# Patient Record
Sex: Male | Born: 1965 | Race: Black or African American | Hispanic: No | Marital: Single | Smoking: Current every day smoker
Health system: Southern US, Community
[De-identification: ages and names within clinical notes are randomized; demographics above are authoritative.]

## PROBLEM LIST (undated history)

## (undated) DIAGNOSIS — Z87442 Personal history of urinary calculi: Secondary | ICD-10-CM

## (undated) DIAGNOSIS — I10 Essential (primary) hypertension: Secondary | ICD-10-CM

## (undated) DIAGNOSIS — N200 Calculus of kidney: Secondary | ICD-10-CM

## (undated) DIAGNOSIS — R0600 Dyspnea, unspecified: Secondary | ICD-10-CM

## (undated) DIAGNOSIS — K7460 Unspecified cirrhosis of liver: Secondary | ICD-10-CM

## (undated) DIAGNOSIS — E7800 Pure hypercholesterolemia, unspecified: Secondary | ICD-10-CM

## (undated) DIAGNOSIS — J45909 Unspecified asthma, uncomplicated: Secondary | ICD-10-CM

## (undated) HISTORY — PX: APPENDECTOMY: SHX54

## (undated) HISTORY — PX: FRACTURE SURGERY: SHX138

---

## 2007-12-22 ENCOUNTER — Emergency Department (HOSPITAL_COMMUNITY): Admission: EM | Admit: 2007-12-22 | Discharge: 2007-12-22 | Payer: Self-pay | Admitting: Emergency Medicine

## 2008-01-14 ENCOUNTER — Emergency Department (HOSPITAL_COMMUNITY): Admission: EM | Admit: 2008-01-14 | Discharge: 2008-01-14 | Payer: Self-pay | Admitting: Emergency Medicine

## 2008-03-23 ENCOUNTER — Emergency Department (HOSPITAL_COMMUNITY): Admission: EM | Admit: 2008-03-23 | Discharge: 2008-03-23 | Payer: Self-pay | Admitting: Emergency Medicine

## 2010-03-29 ENCOUNTER — Emergency Department (HOSPITAL_COMMUNITY)
Admission: EM | Admit: 2010-03-29 | Discharge: 2010-03-29 | Payer: Self-pay | Source: Home / Self Care | Admitting: Emergency Medicine

## 2010-03-29 IMAGING — CR DG FOOT COMPLETE 3+V*R*
3 series · 3 of 3 positions shown · non-contrast
Comparison: None.

CLINICAL DATA: Left foot pain.  Jumped from ladder.

RIGHT FOOT COMPLETE - 3+ VIEW

[view not recorded (1 of 3)]
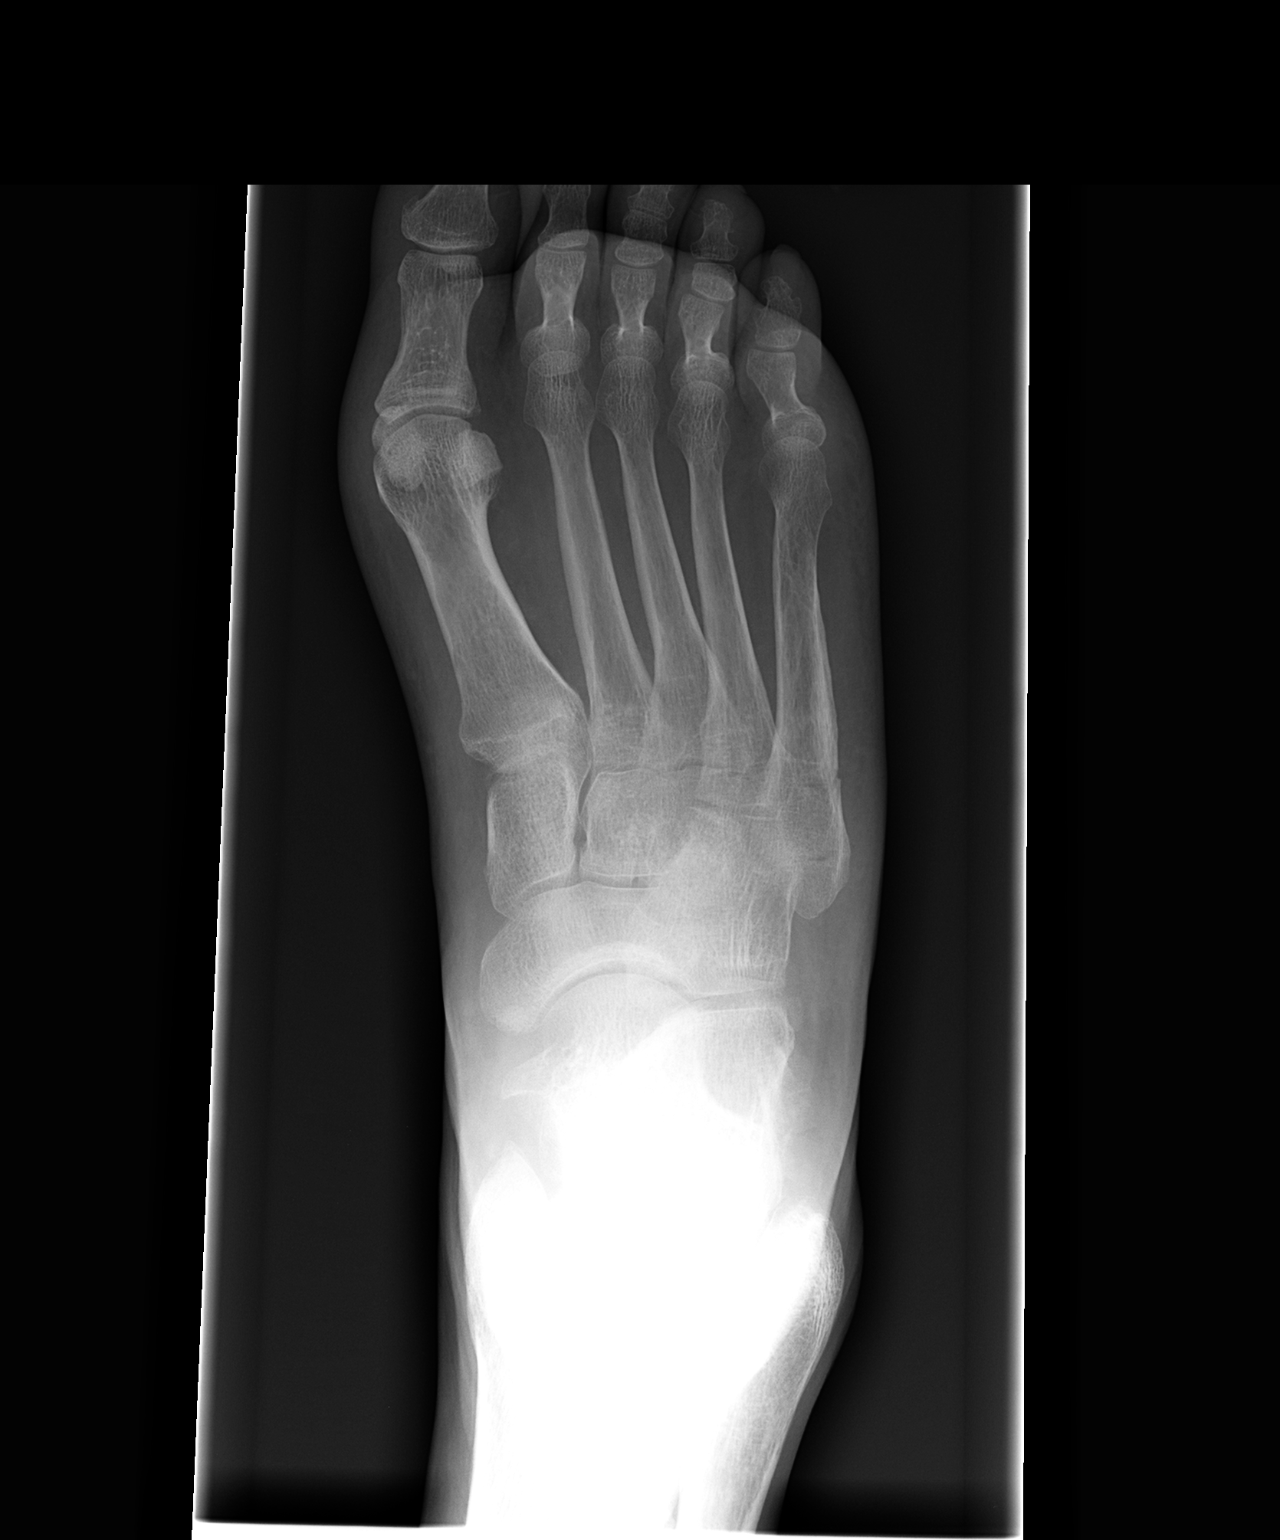

[view not recorded (2 of 3)]
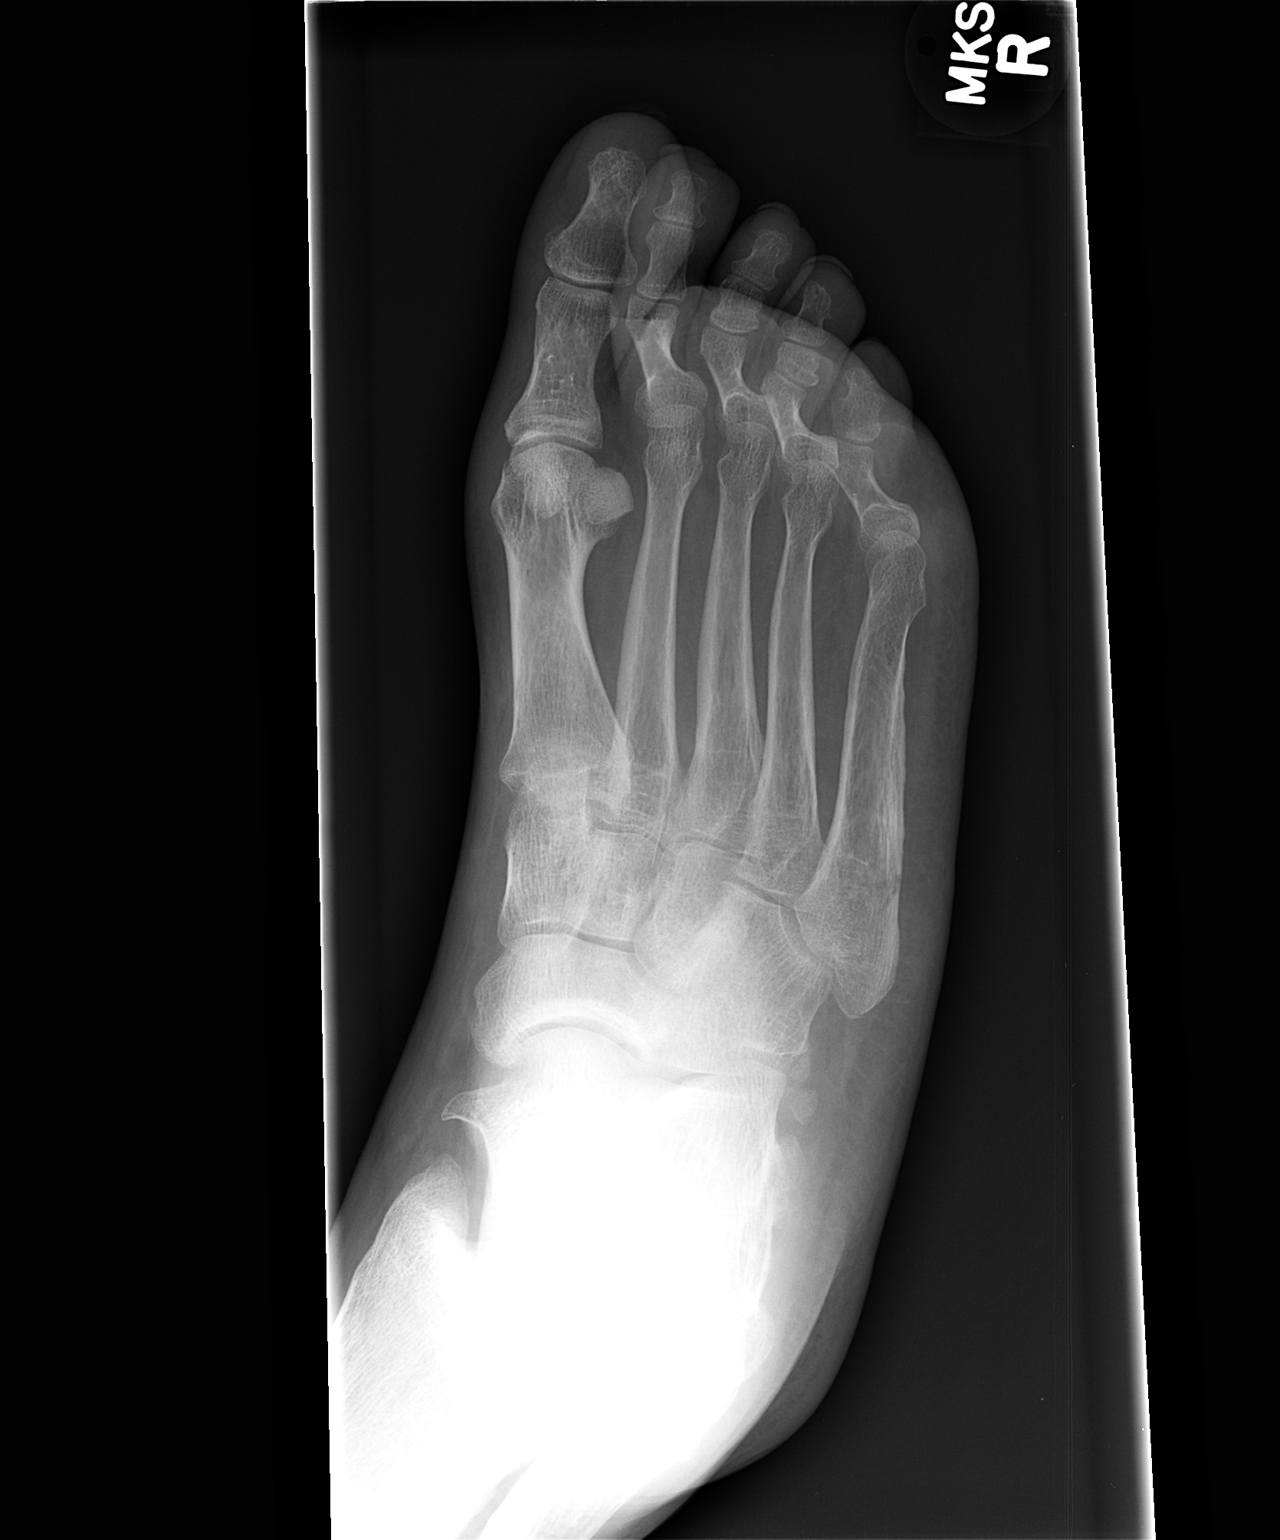

[view not recorded (3 of 3)]
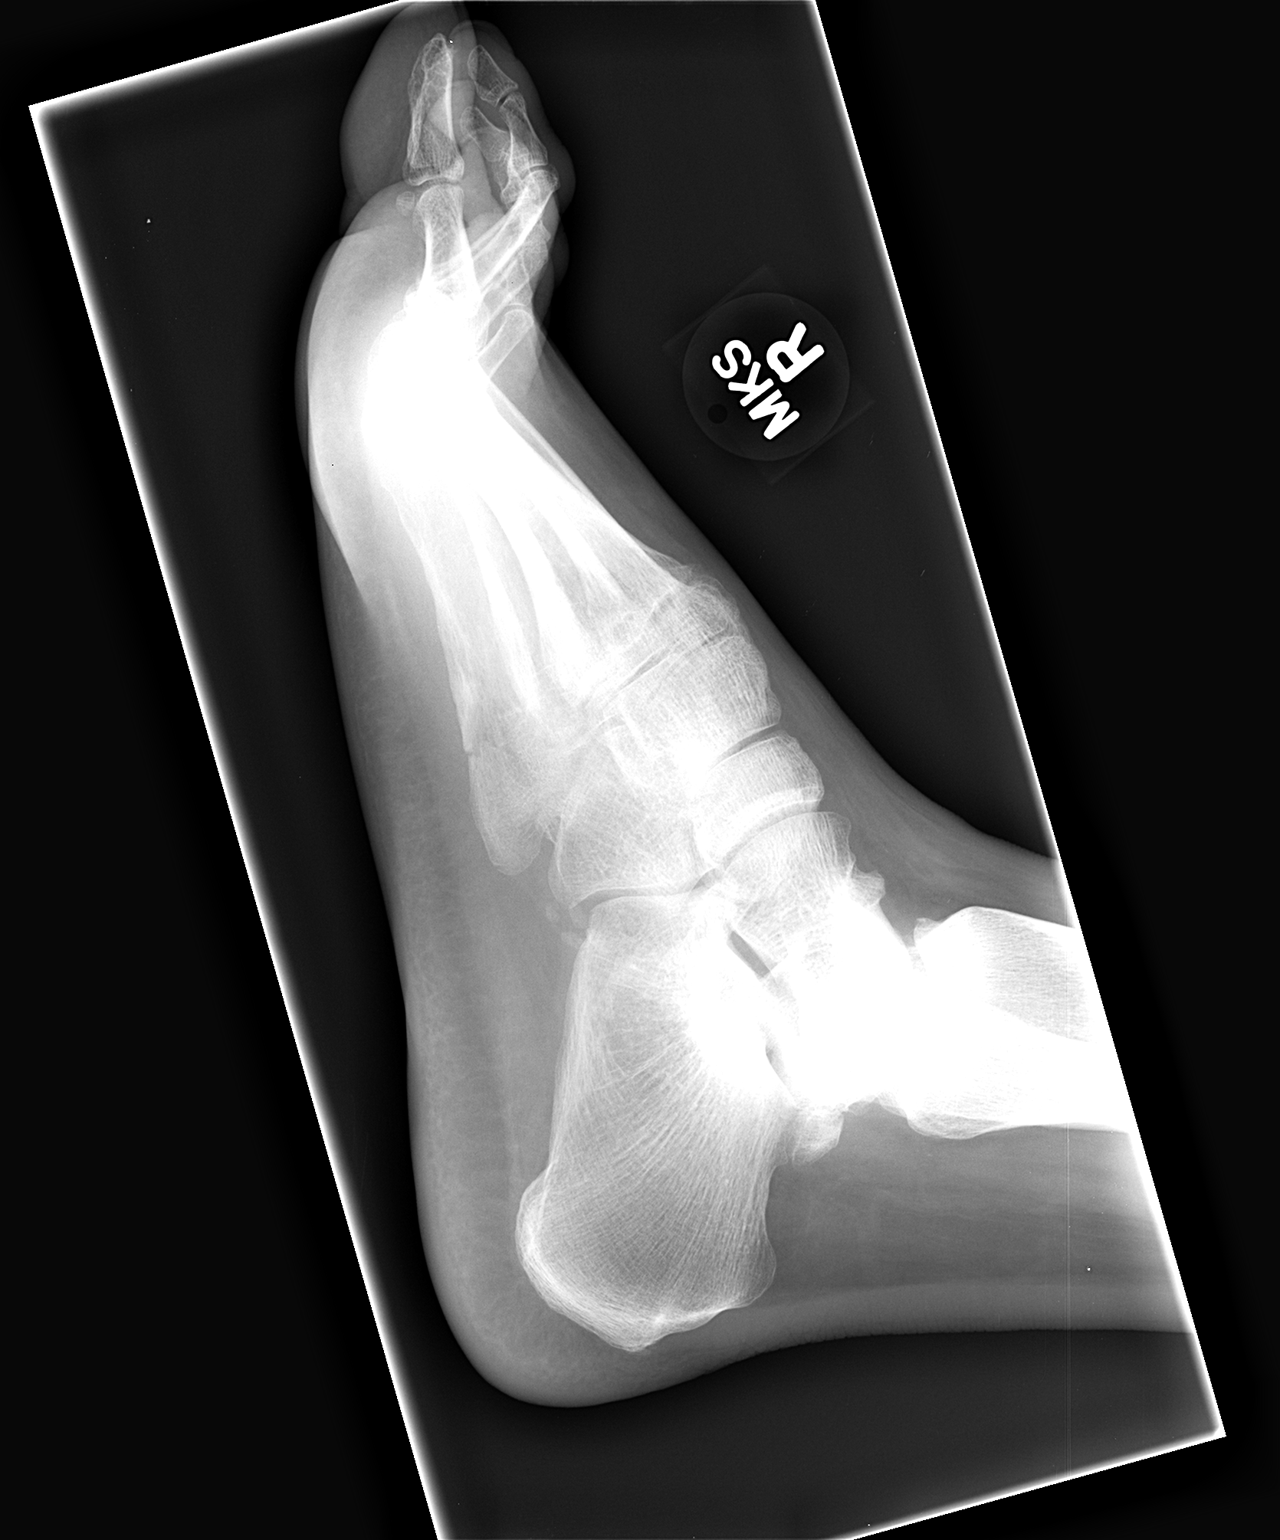

[3 of 3 positions shown; findings below may reference images not displayed]

FINDINGS: There is a comminuted, minimally displaced fracture at
the base of the fifth metatarsal with intra-articular extension
seen.  No other fractures are identified.  Mild midfoot
osteoarthritis is noted.
IMPRESSION: Fracture at the base of the fifth metatarsal.

## 2010-03-29 IMAGING — CT CT EXTREM LOW W/O CM*L*
3 of 4 series · 8 of 33 positions shown, 10 images · non-contrast
Comparison: [DATE] plain film exam.

CLINICAL DATA: Jumped from ladder.  Possible calcaneal and fifth
metatarsal fracture.

CT LEFT FOOT/ANKLE. WITHOUT CONTRAST
TECHNIQUE: Multidetector CT imaging of the left foot/ankle. was
performed according to the standard protocol without intravenous
contrast. Multiplanar CT image reconstructions were also generated.

[Series 3: axial bone pacs 2.0 · coronal · 0.26mm/px · 1 of 126 slices shown]
[im 63/126  bone]
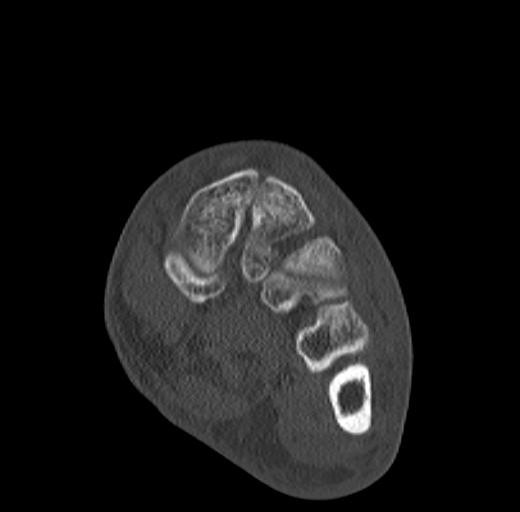

[Series 4: coro bone pacs 2.0 · axial · 0.22mm/px · z∈[-198,-140]mm · 2 of 64 slices shown, 3 images]
[im 20/64  soft-tissue]
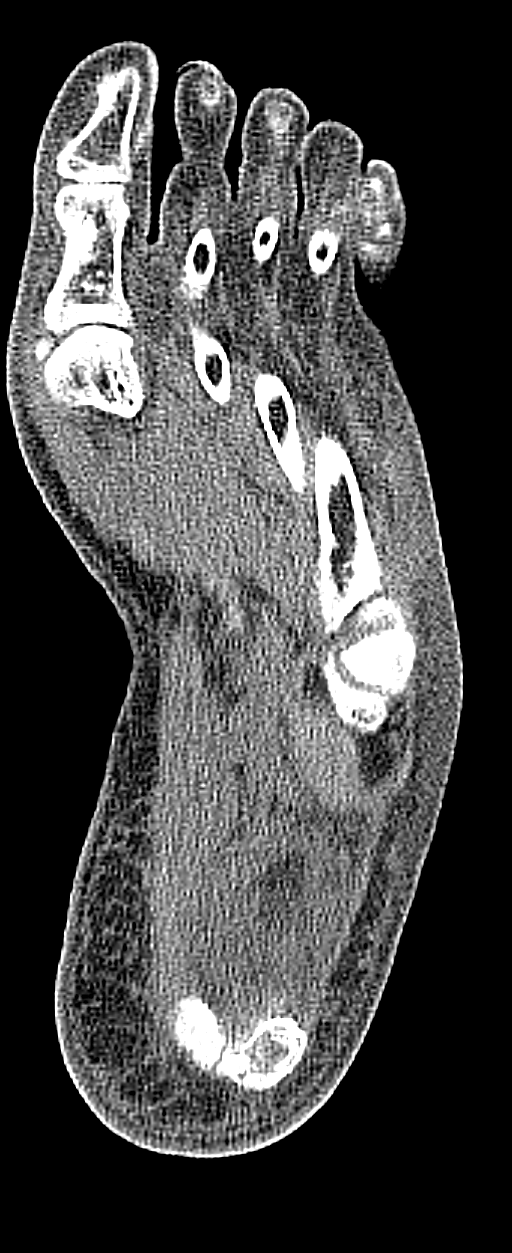
[im 20/64  bone]
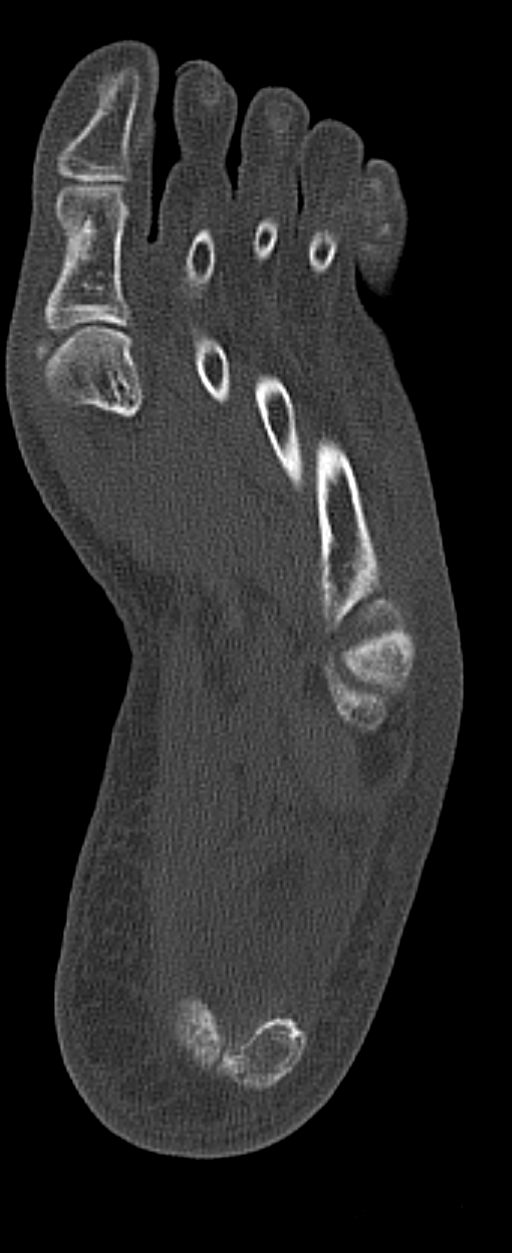
[im 49/64  bone]
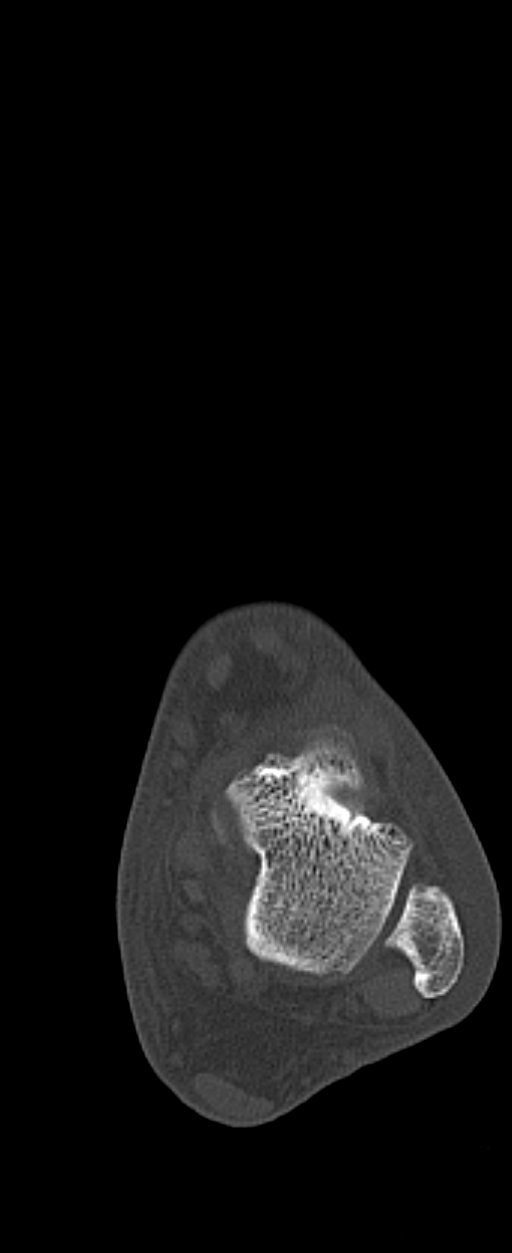

[Series 5: sagittal bone pacs 2.0 · sagittal · 0.27mm/px · 5 of 54 slices shown, 6 images]
[im 18/54  bone]
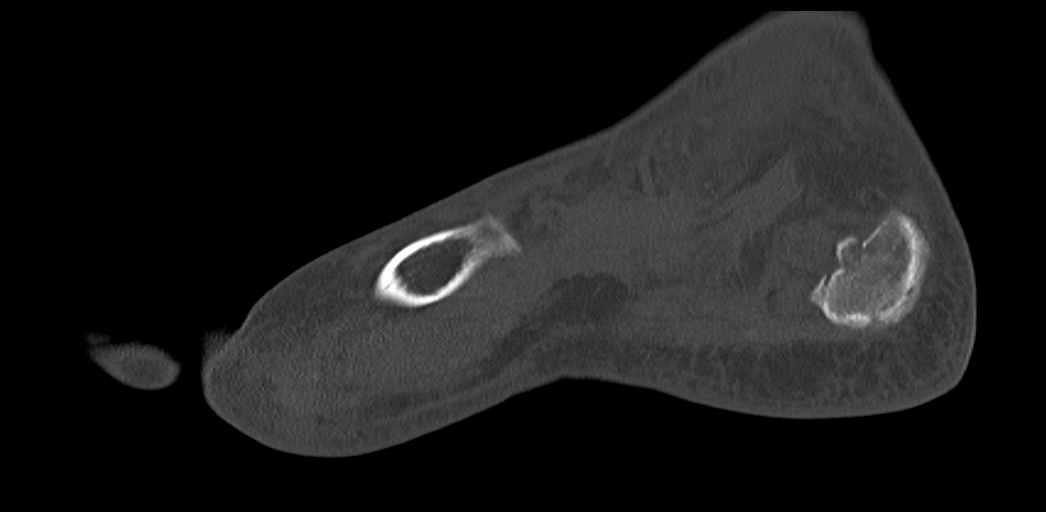
[im 23/54  bone]
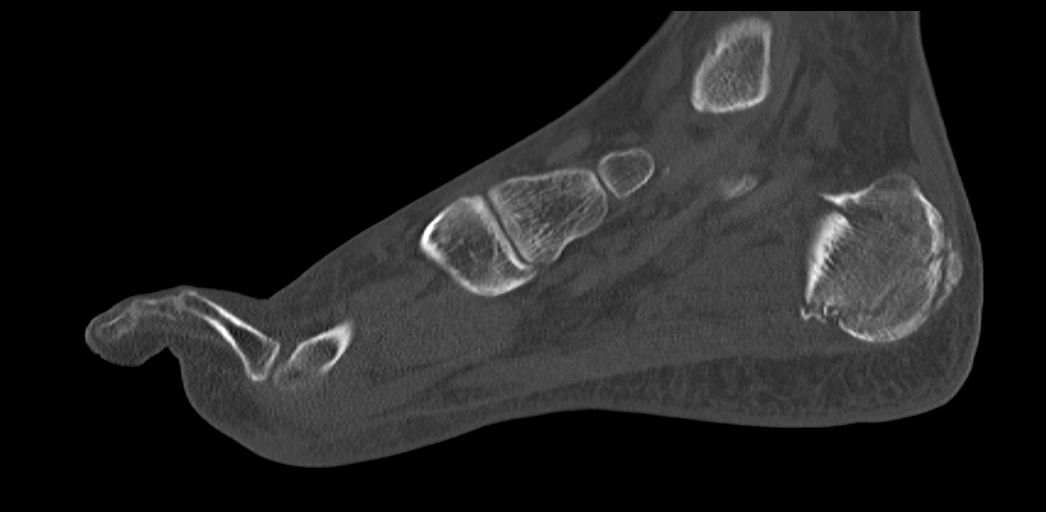
[im 27/54  soft-tissue]
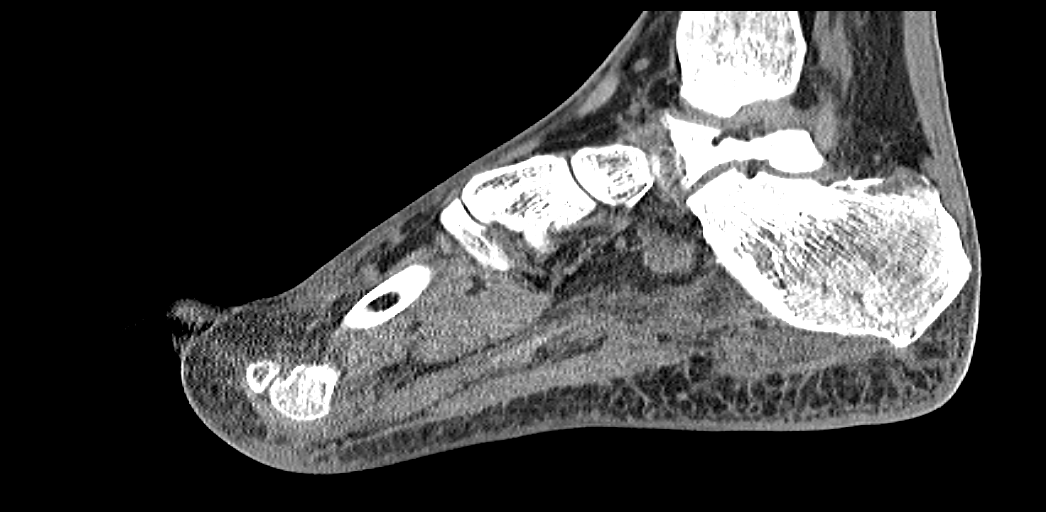
[im 27/54  bone]
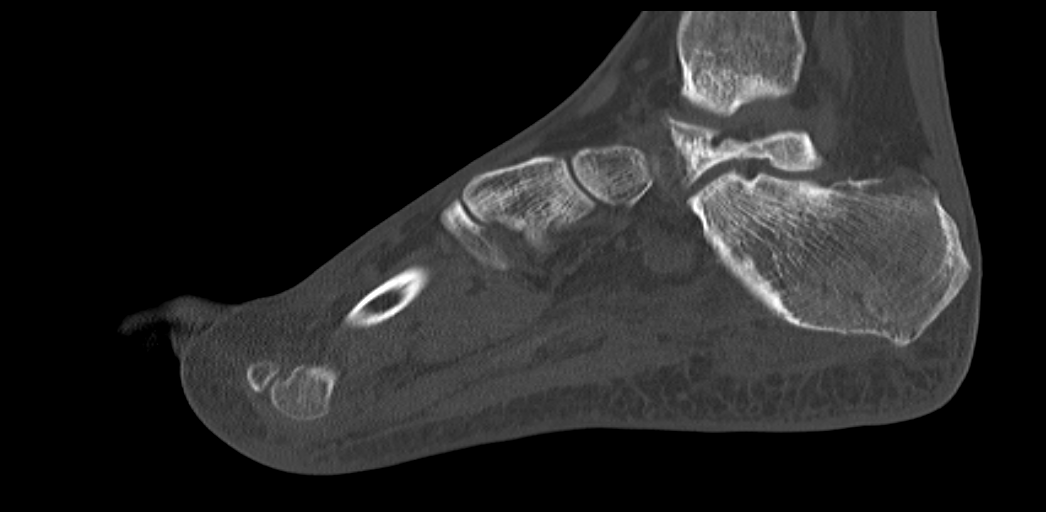
[im 31/54  bone]
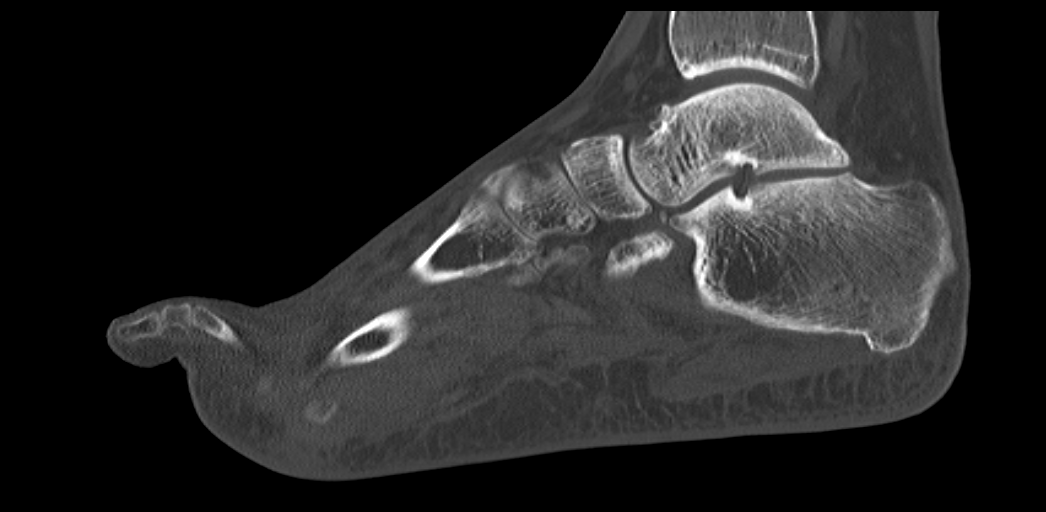
[im 36/54  bone]
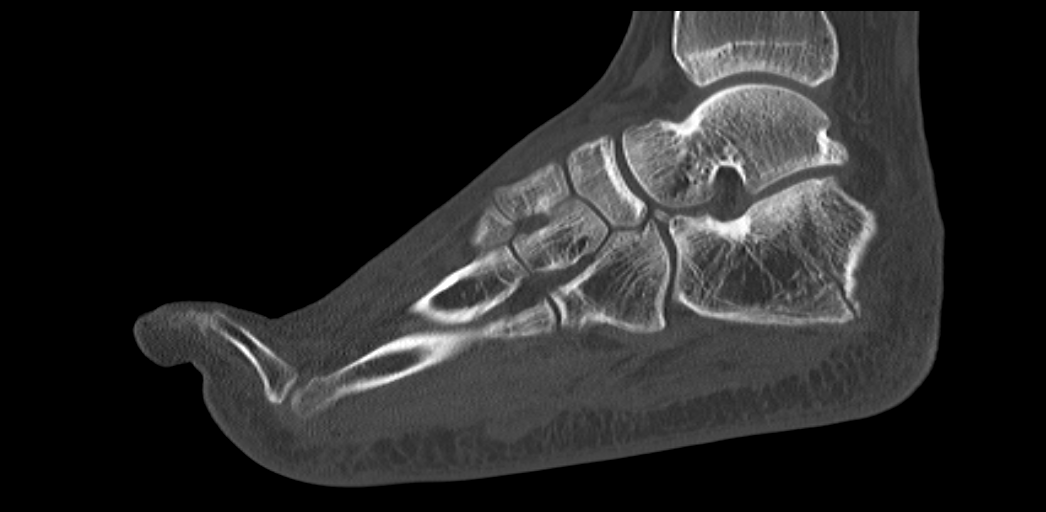

[8 of 33 positions shown; findings below may reference images not displayed]

FINDINGS: Comminuted fracture of the posterior and medial aspect
of the calcaneus extending towards the region of the posterior
articular surface (does not enter the joint space).  Fracture
extends into the base of the sustentaculum tali.  Fracture
fragments are separated by up to 4 mm.  No other fracture noted.
IMPRESSION: Comminuted fracture of the posterior and medial aspect of the
calcaneus extending towards the region of the posterior articular
surface (does not enter the joint space).  Fracture extends into
the base of the sustentaculum tali.  Fracture fragments are
separated by up to 4 mm.

No other fracture noted.

## 2010-03-29 IMAGING — CR DG FOOT COMPLETE 3+V*L*
3 series · 3 of 3 positions shown · non-contrast
Comparison: Today's films

CLINICAL DATA: Jumped off ladder 10 feet 4 days ago.  Pain in the
entire foot and ankle.

LEFT FOOT - COMPLETE 3+ VIEW

[view not recorded (1 of 3)]
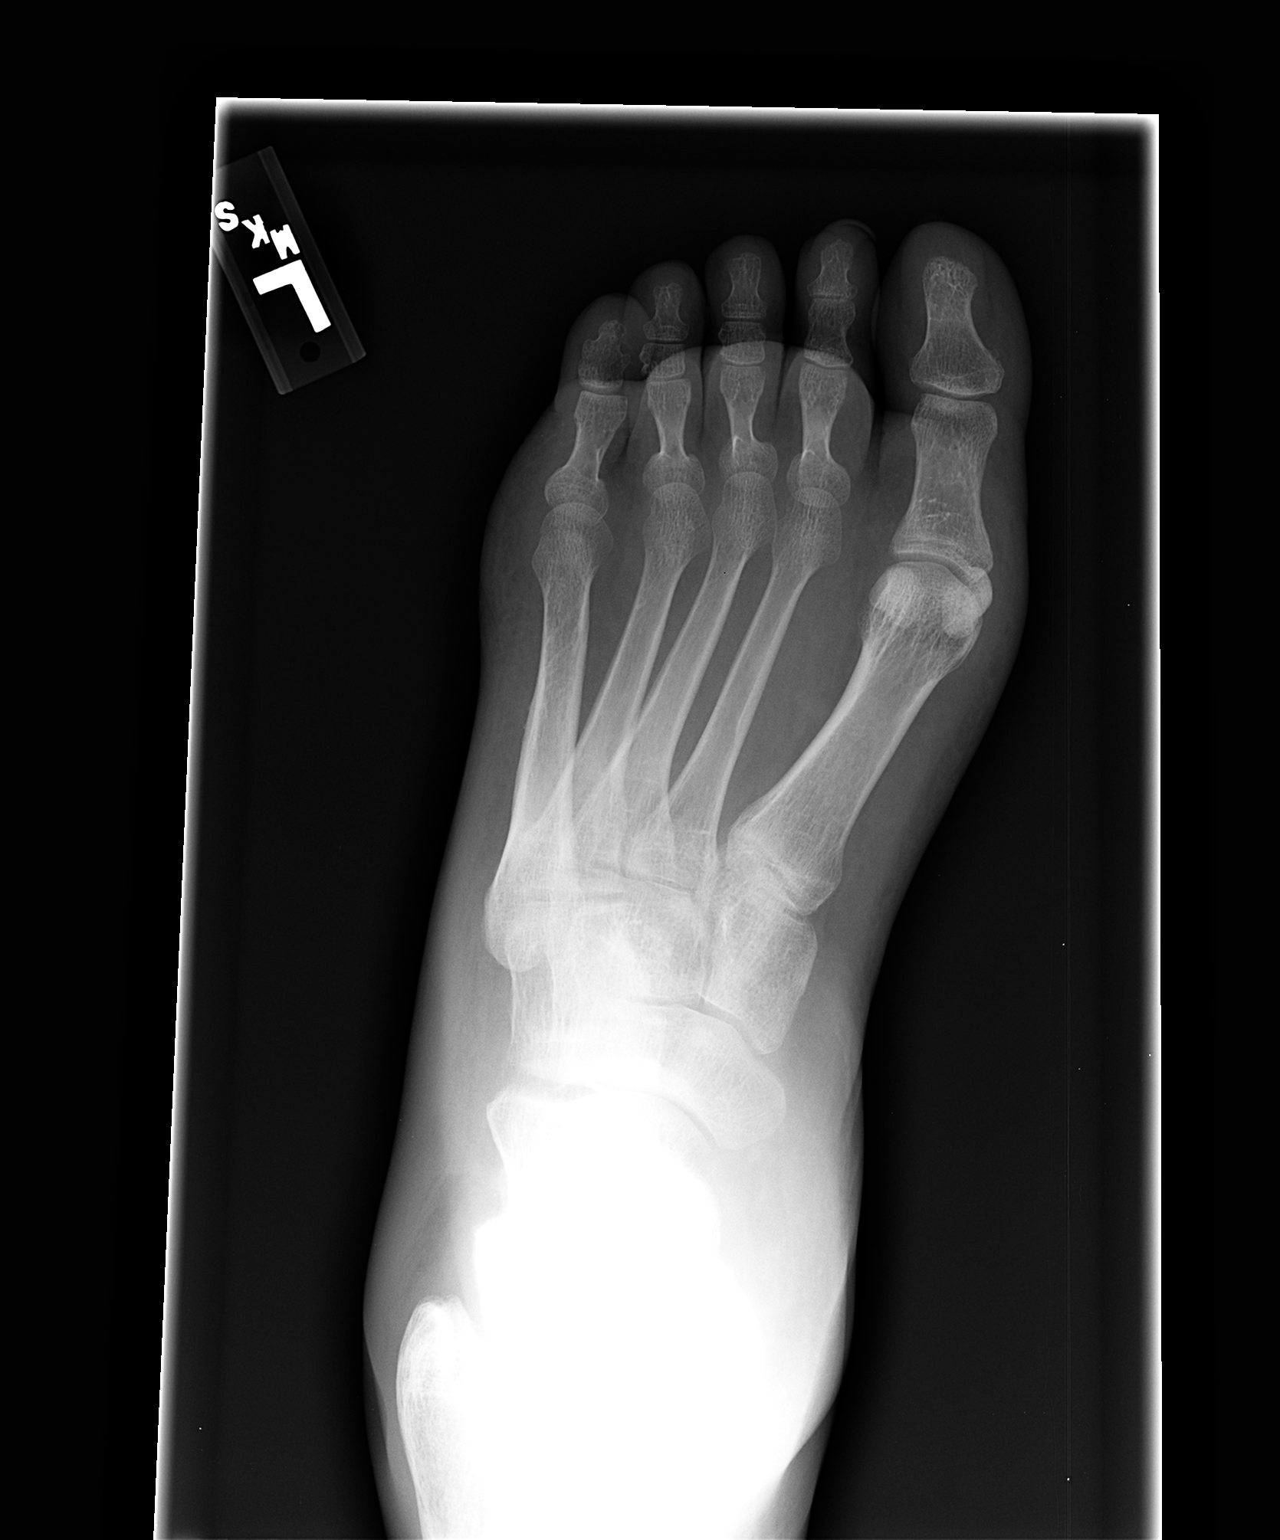

[view not recorded (2 of 3)]
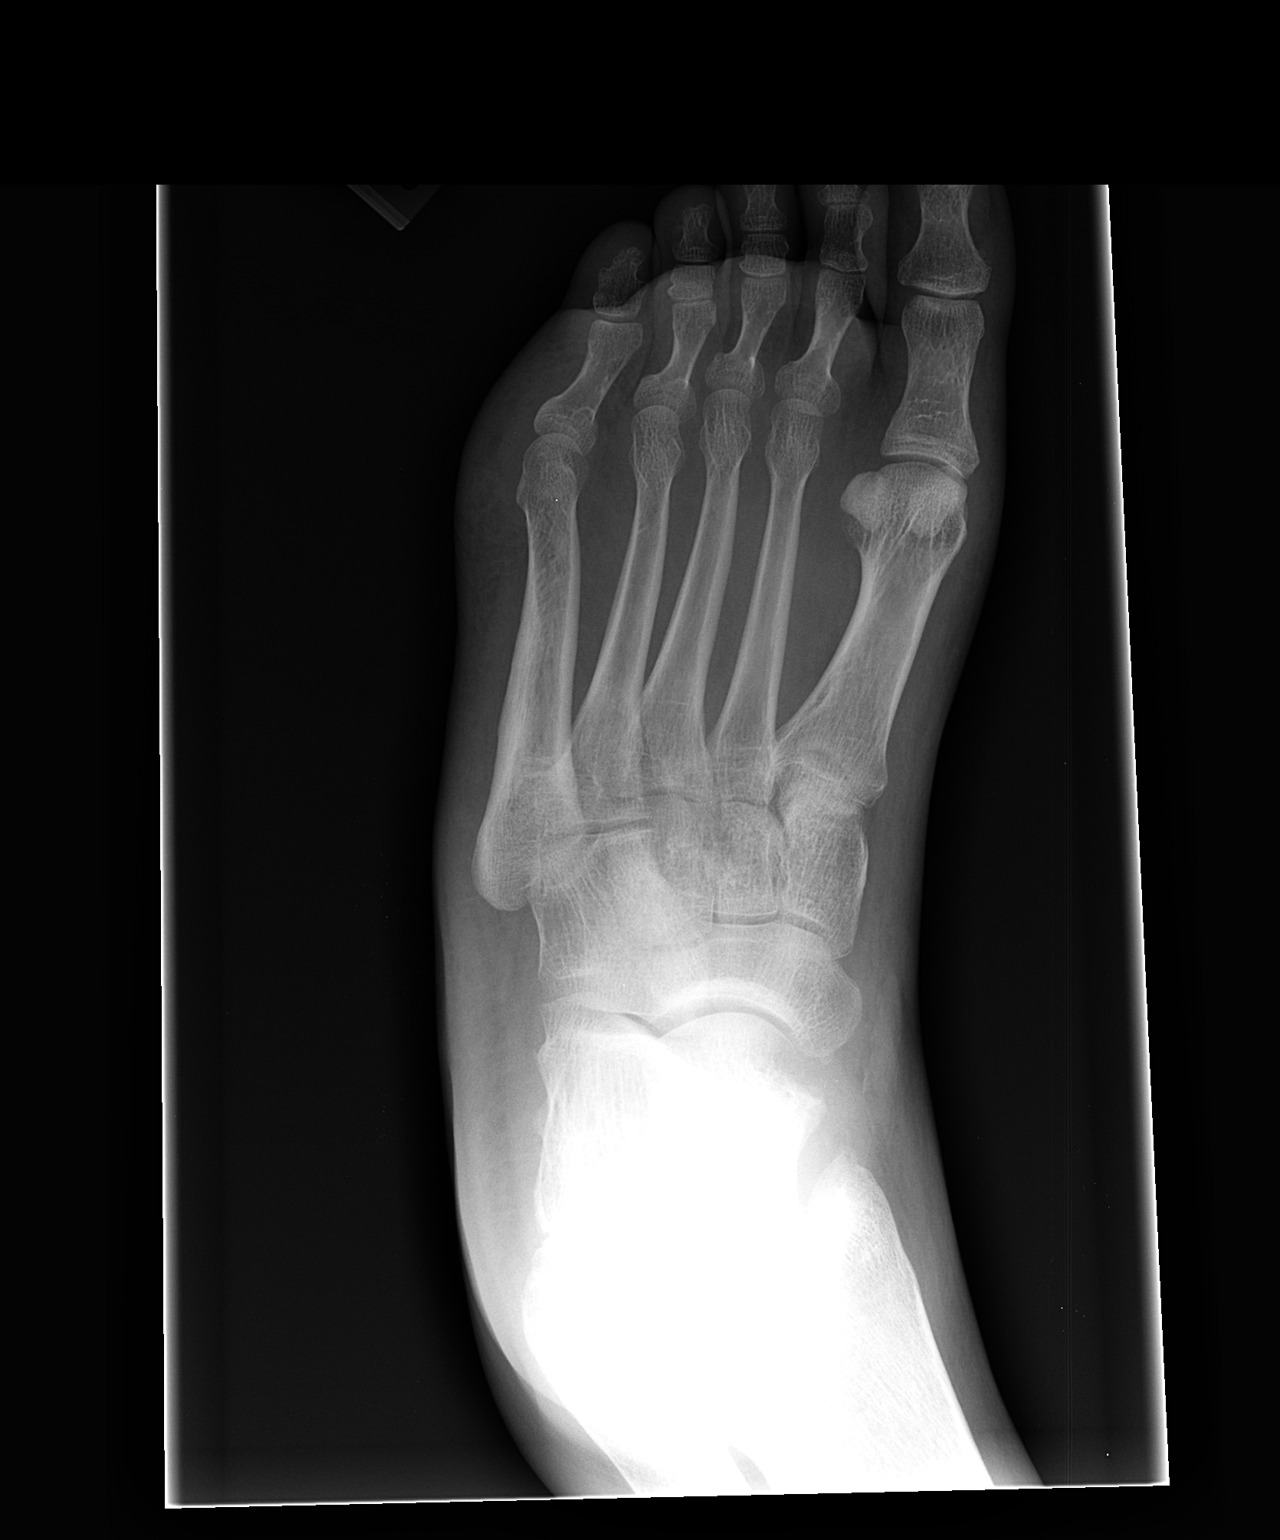

[view not recorded (3 of 3)]
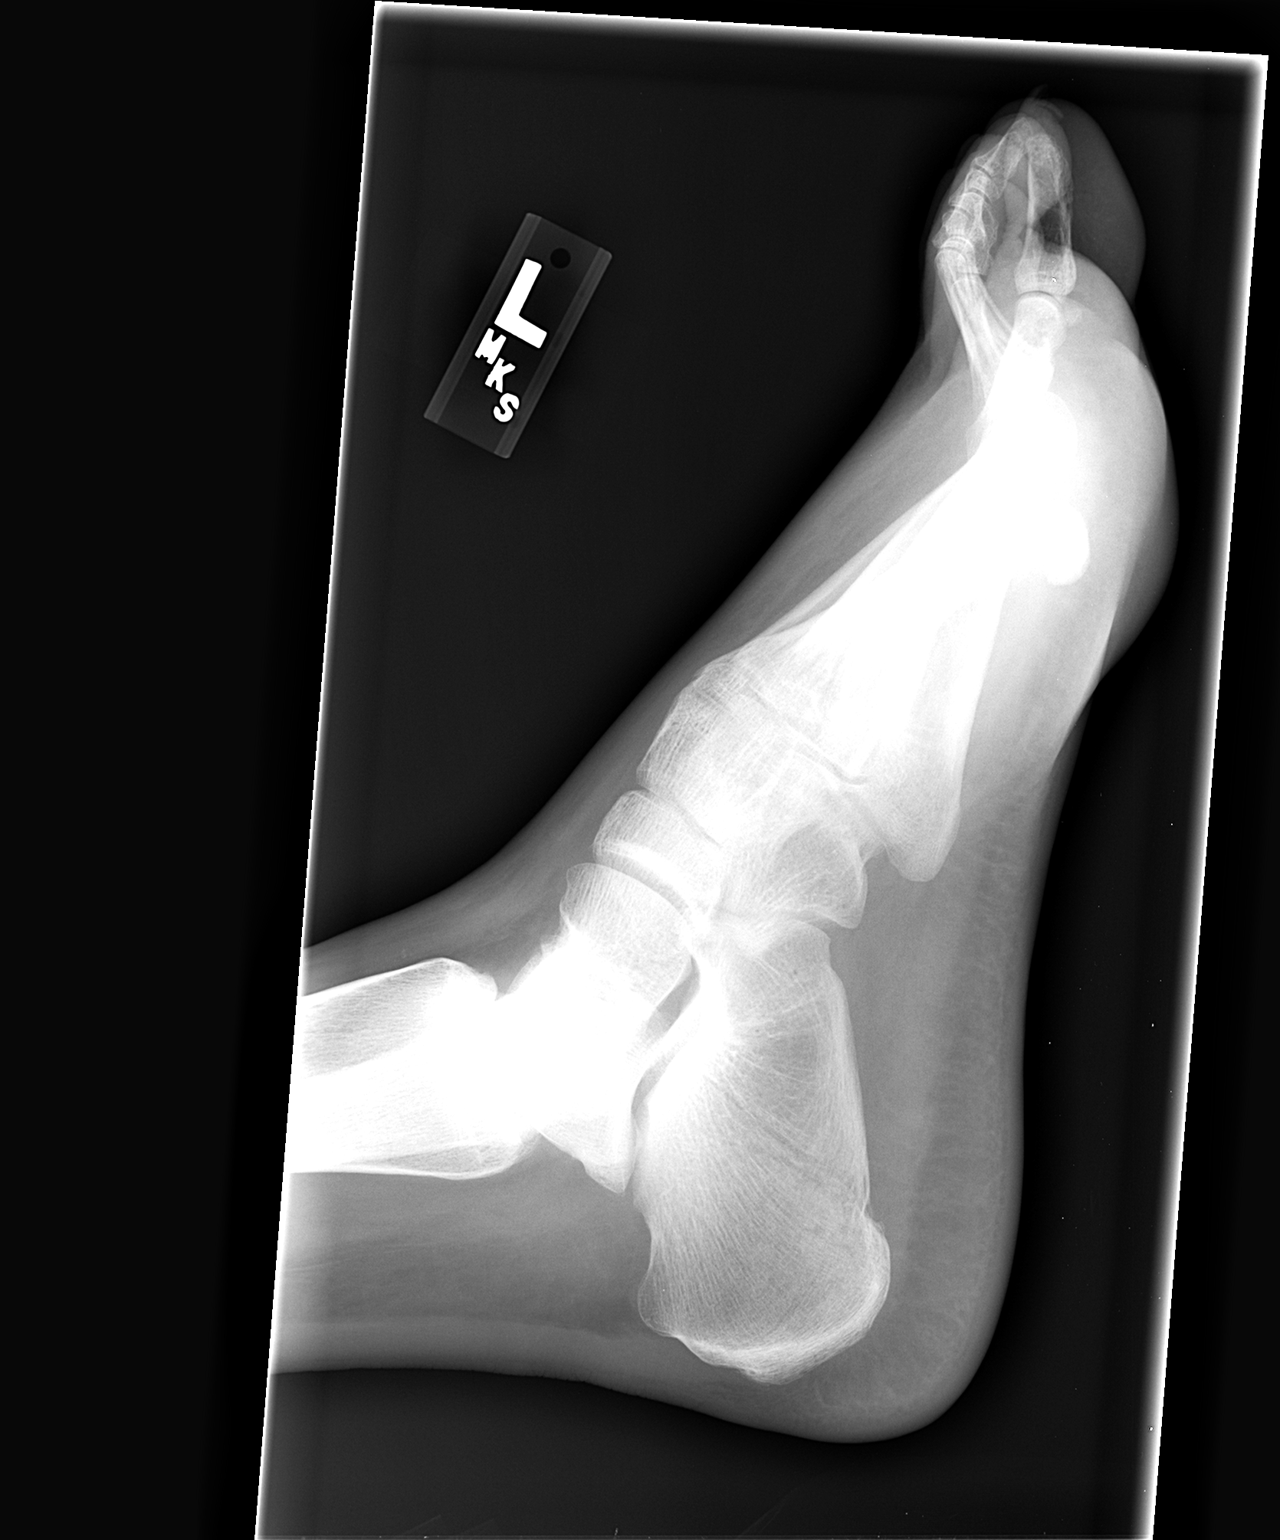

[3 of 3 positions shown; findings below may reference images not displayed]

FINDINGS: There is diffuse soft tissue swelling of the foot.  The
lateral view raises the question of calcaneus fracture.  Further
evaluation with CT may be helpful.  No radiopaque foreign body or
soft tissue gas identified.
IMPRESSION: Question of calcaneus fracture.  Diffuse soft tissue swelling.

## 2010-03-29 IMAGING — CR DG ANKLE COMPLETE 3+V*L*
3 series · 3 of 3 positions shown · non-contrast
Comparison: None

CLINICAL DATA: Jumped off ladder, 10 feet.  Landed on both feet.
Medial left ankle pain.  Lateral right ankle pain.  Entire foot and
ankle swelling.  Injury 4 days ago.

LEFT ANKLE COMPLETE - 3+ VIEW

[view not recorded (1 of 3)]
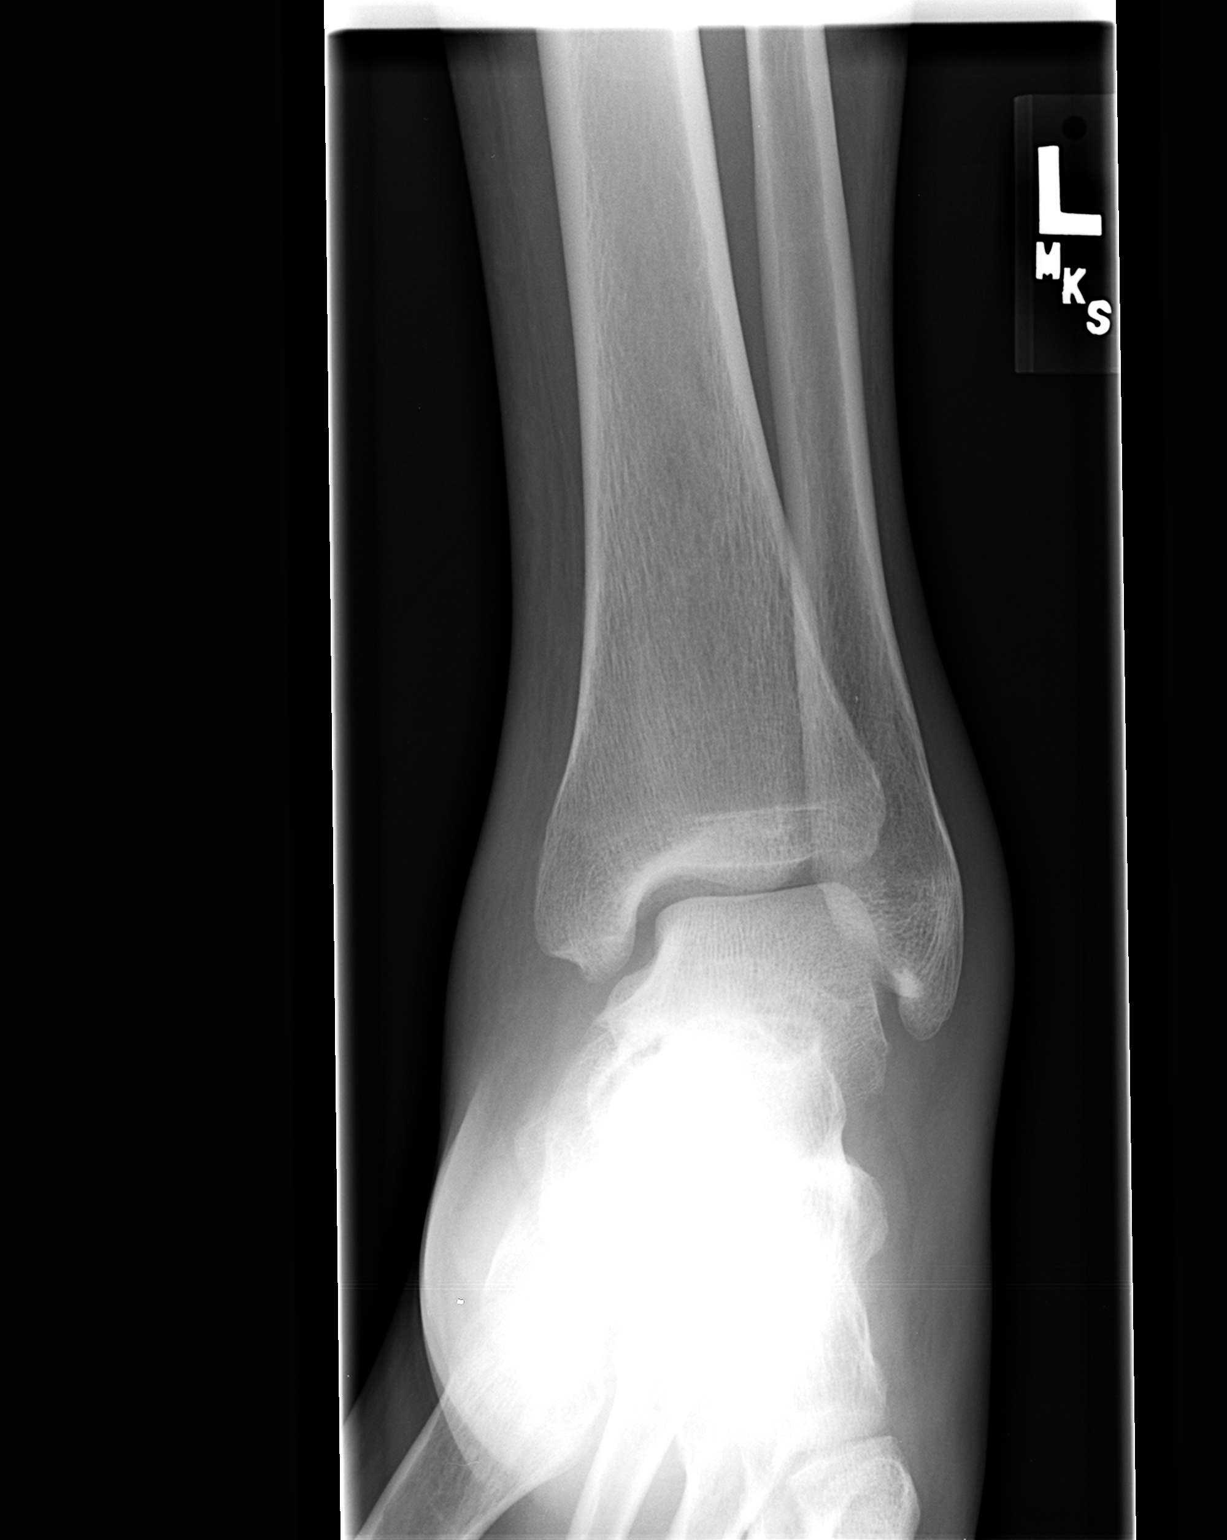

[view not recorded (2 of 3)]
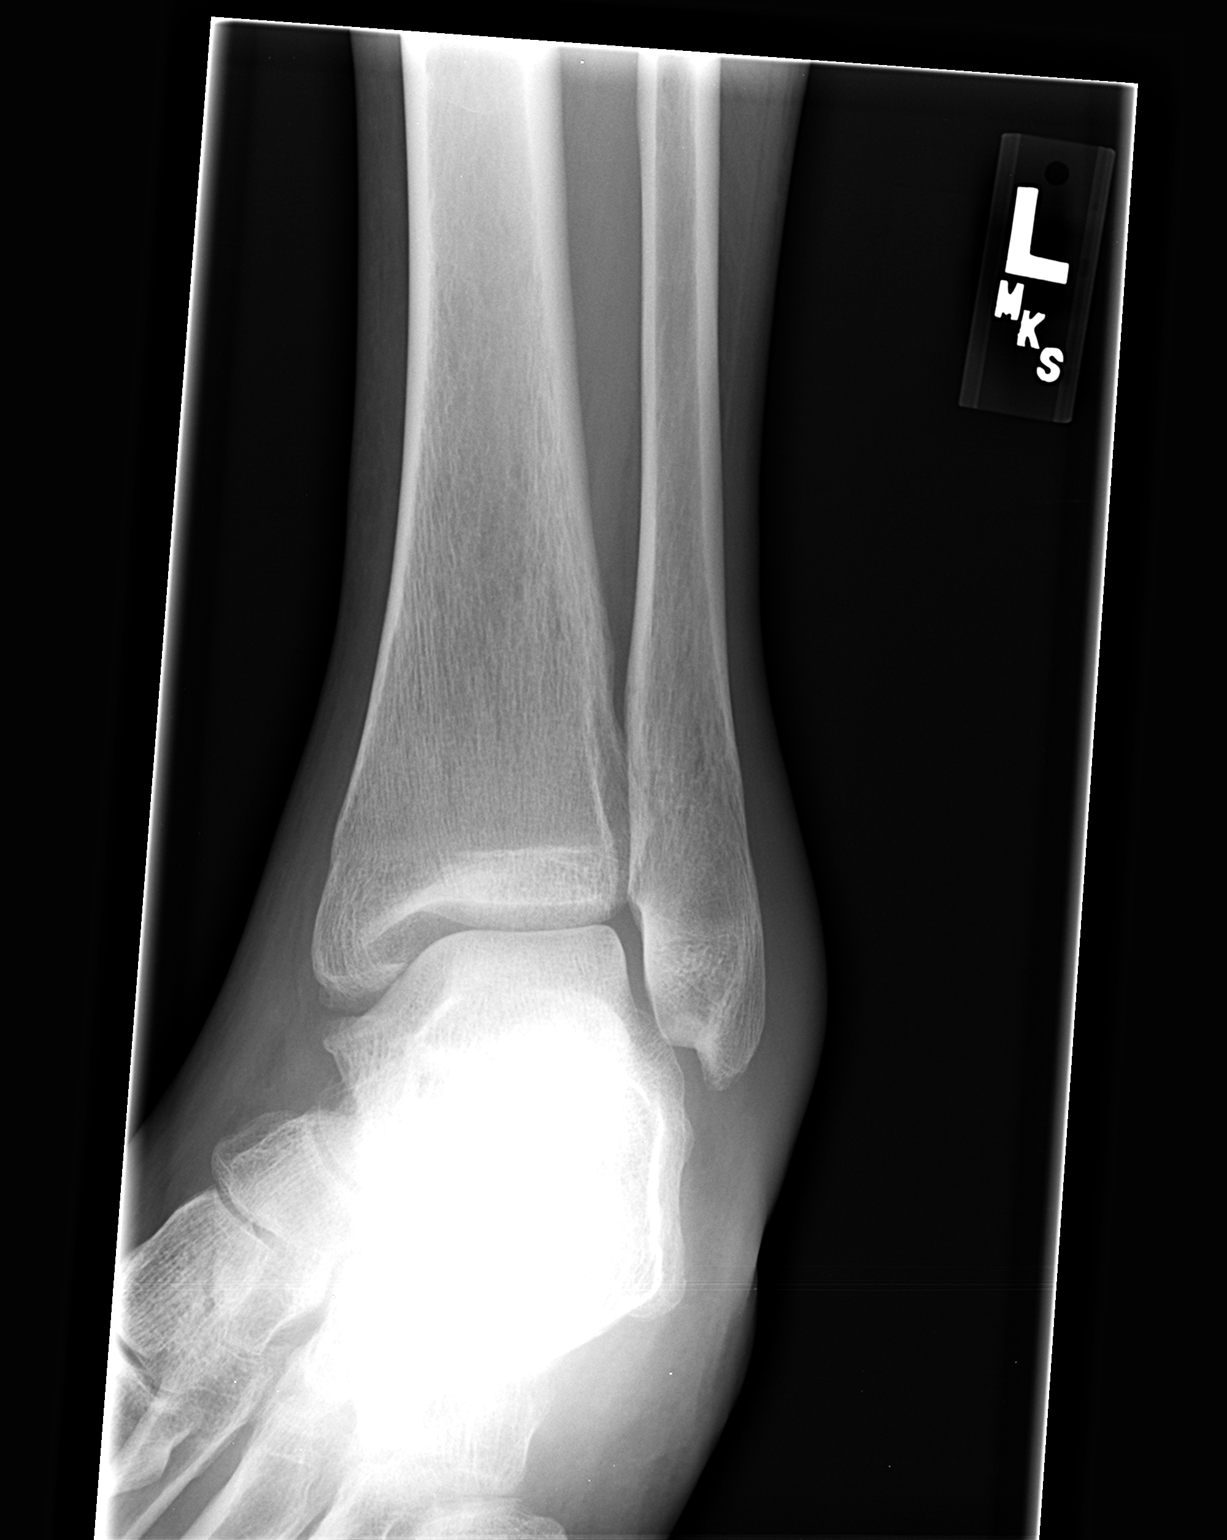

[view not recorded (3 of 3)]
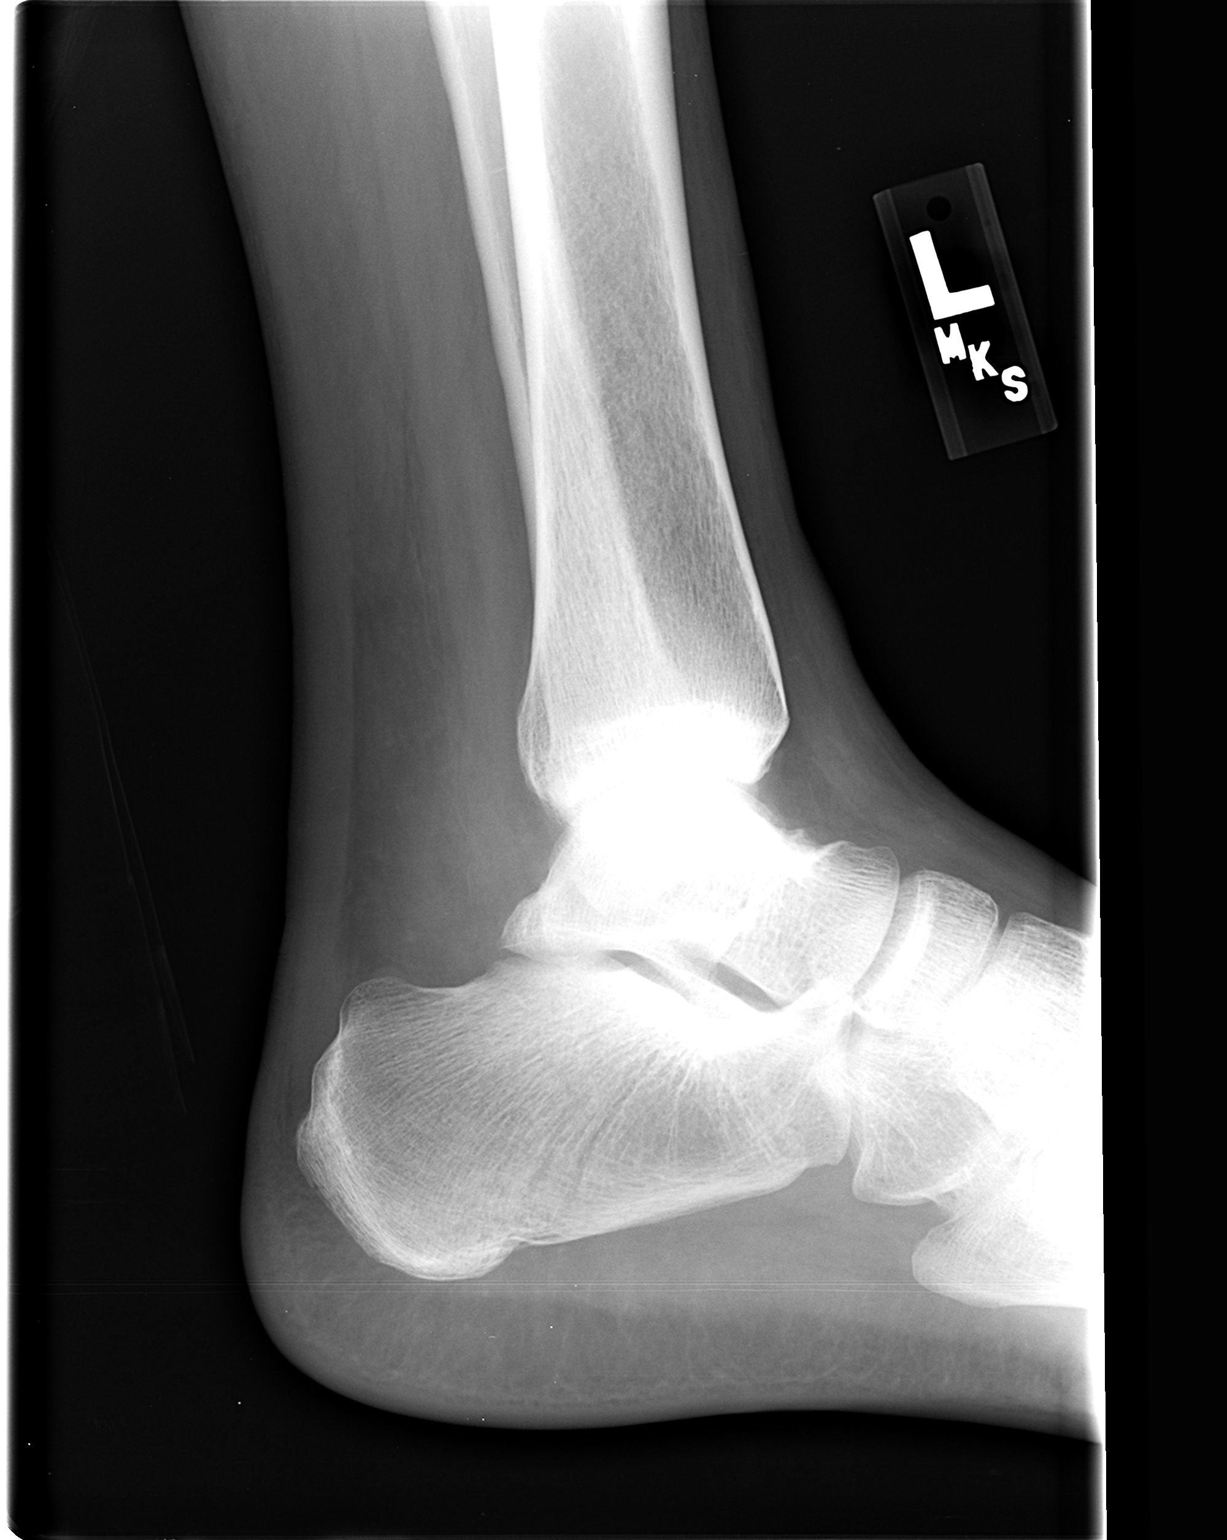

[3 of 3 positions shown; findings below may reference images not displayed]

FINDINGS: There is diffuse soft tissue swelling.    Linear lucency
traverses the calcaneus, raising the question of calcaneal
fracture.  The distal tibia and fibula appear intact.  The talus
appears intact.
IMPRESSION: 1.  Question of calcaneus fracture.  Consider CT as indicated.
2.  Diffuse soft tissue swelling.

## 2010-03-29 IMAGING — CR DG ANKLE COMPLETE 3+V*R*
3 series · 3 of 3 positions shown · non-contrast
Comparison: Other views today

CLINICAL DATA: Jumped off ladder 10 feet 4 days ago.  Lateral right
ankle pain.

RIGHT ANKLE - COMPLETE 3+ VIEW

[view not recorded (1 of 3)]
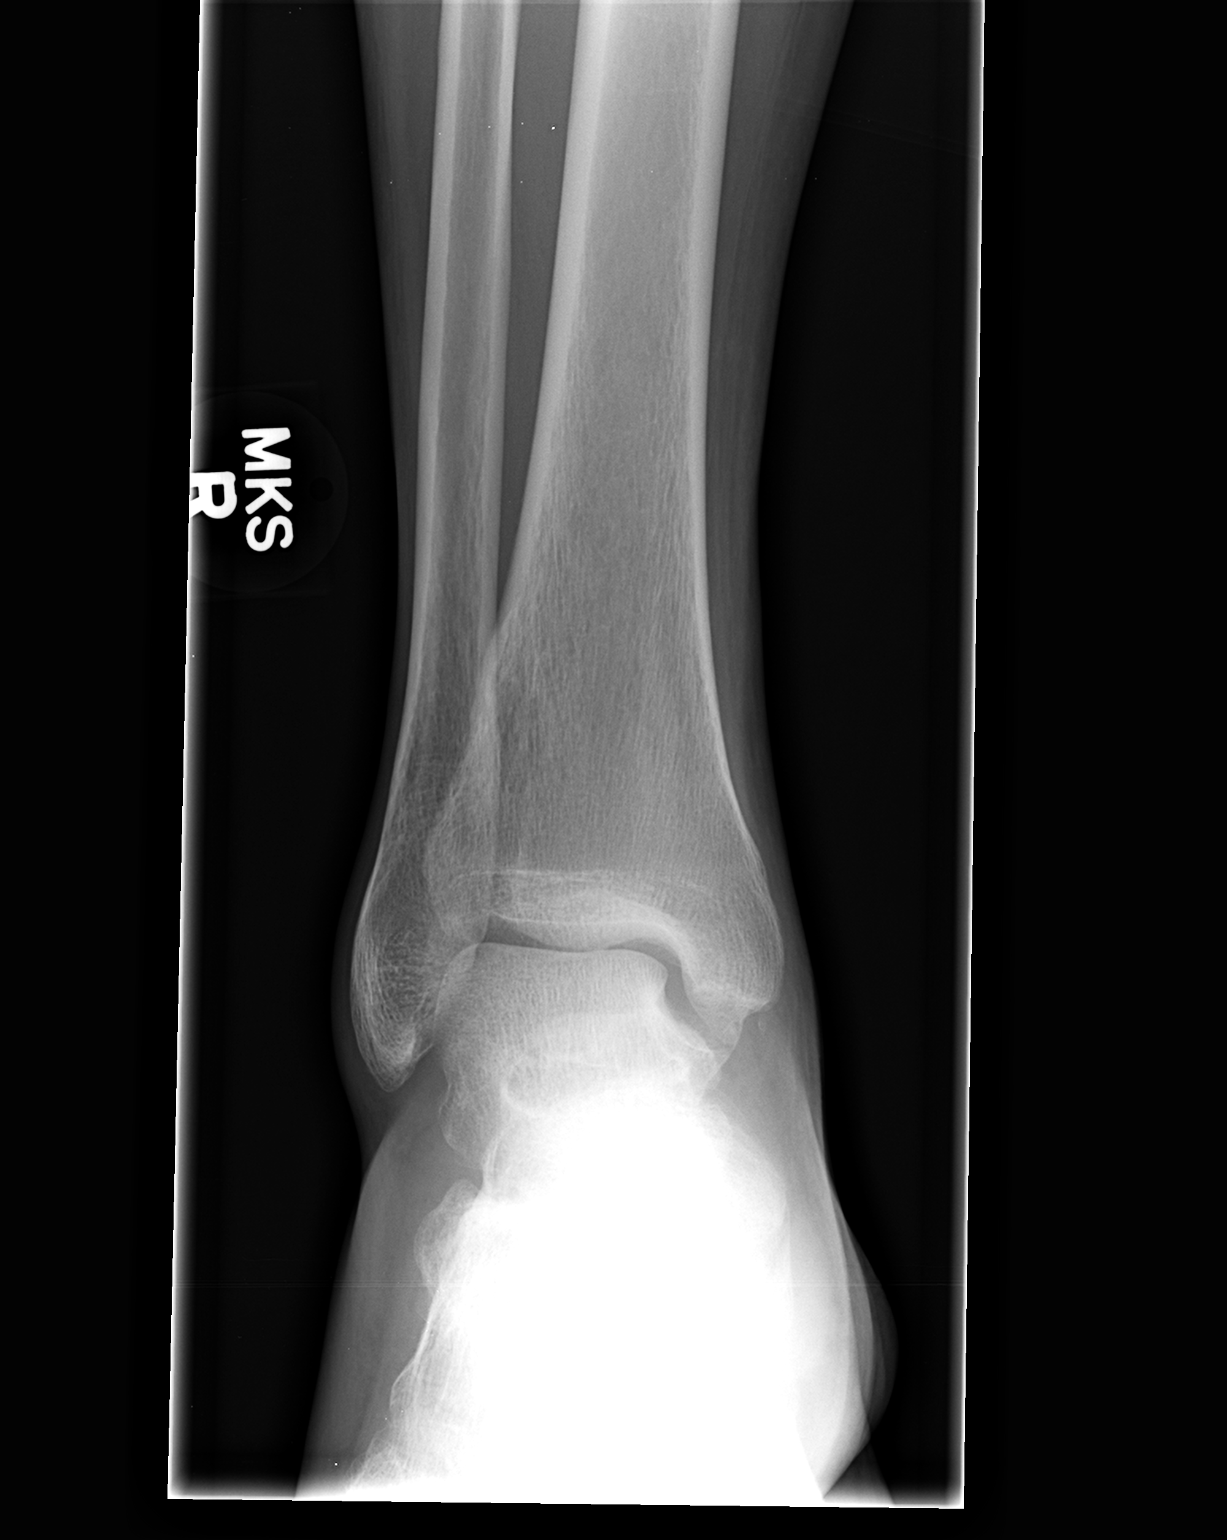

[view not recorded (2 of 3)]
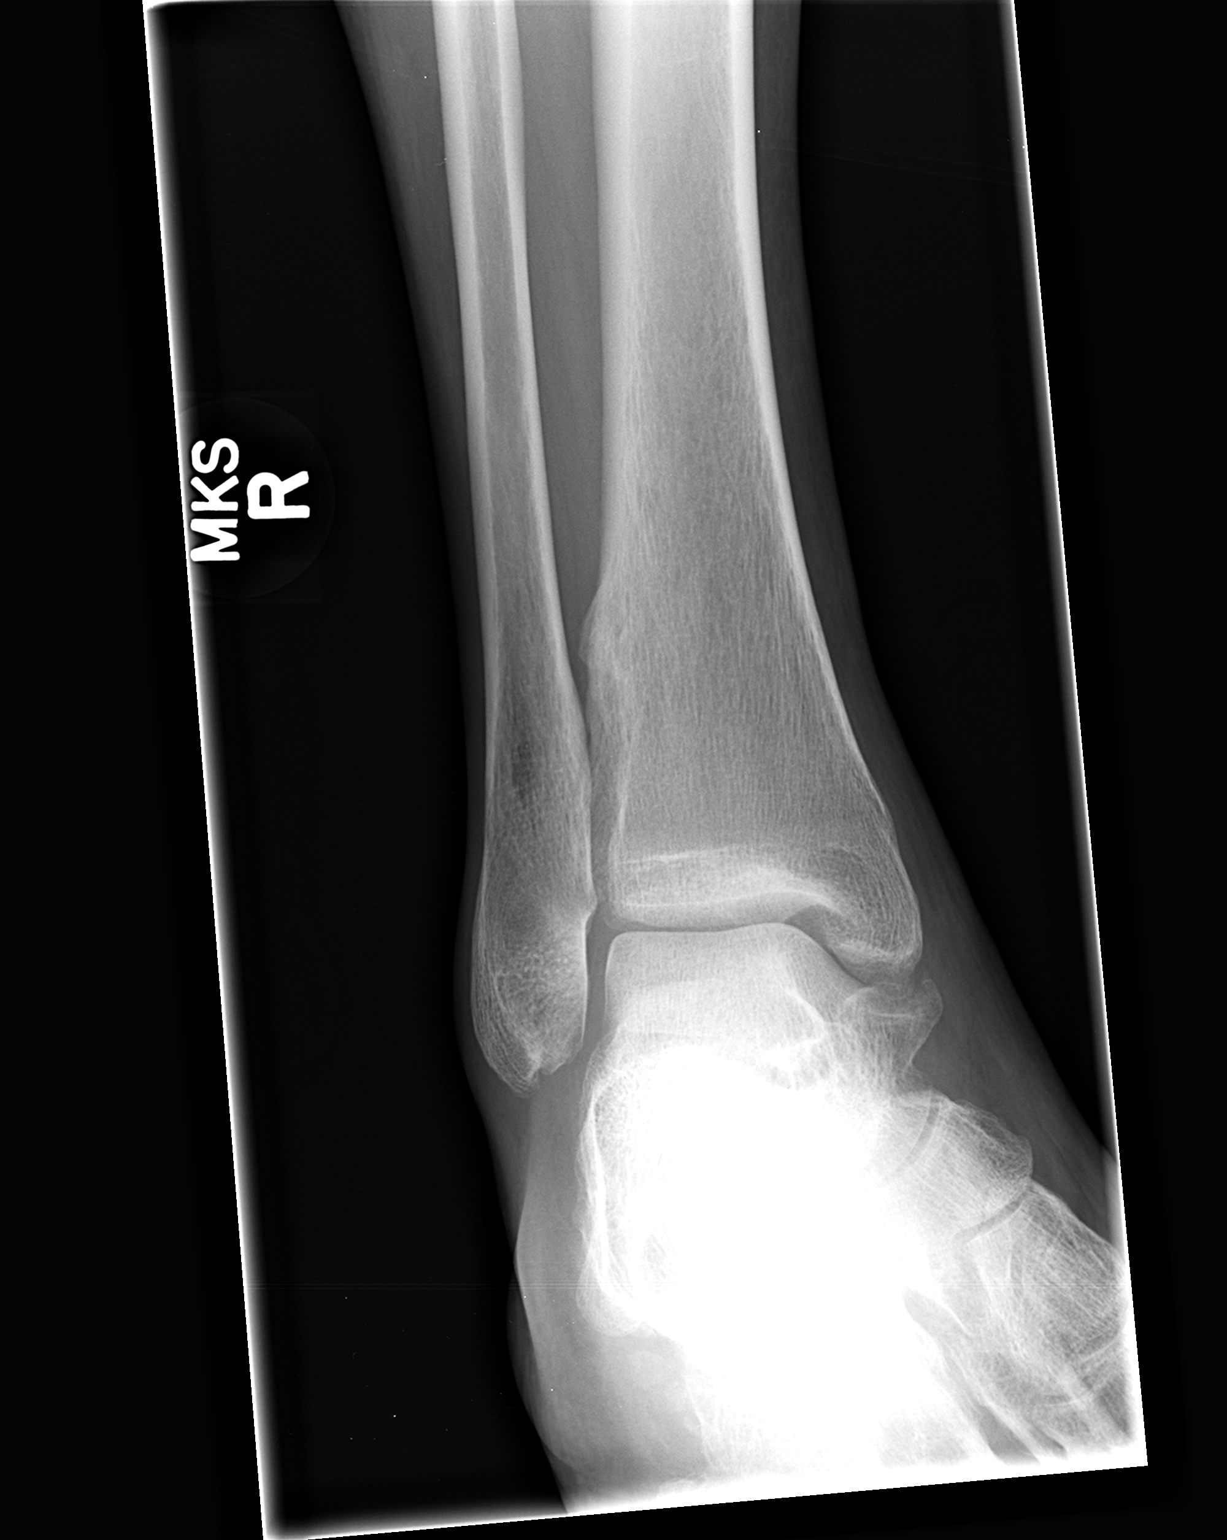

[view not recorded (3 of 3)]
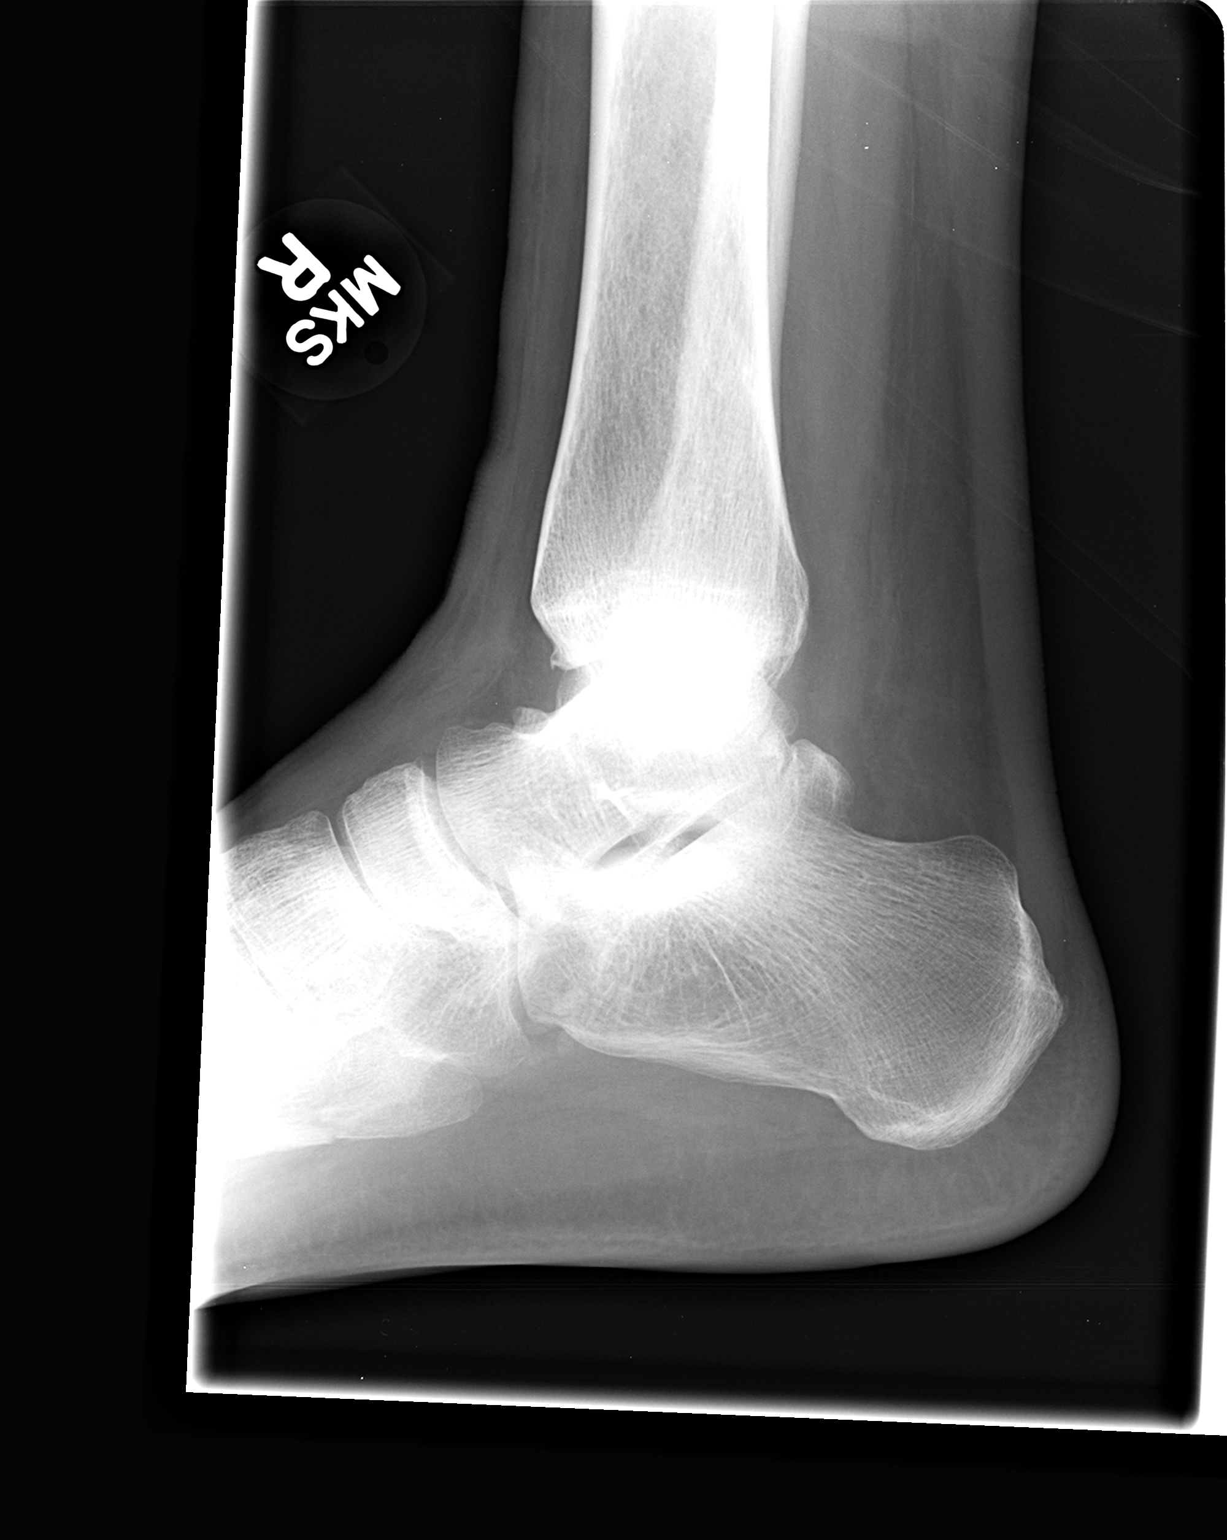

[3 of 3 positions shown; findings below may reference images not displayed]

FINDINGS: Three views are performed, showing question of a fracture
of the fifth metatarsal.  Consider further evaluation with
dedicated views of the right foot.  The distal tibia and fibula
appear intact.  There is mild soft tissue swelling of the ankle.
IMPRESSION: 1.  Question of fracture of the fifth metatarsal.  Consider
dedicated views of the foot.
2.  Mild soft tissue swelling.

## 2010-03-31 ENCOUNTER — Emergency Department (HOSPITAL_COMMUNITY)
Admission: EM | Admit: 2010-03-31 | Discharge: 2010-03-31 | Payer: Self-pay | Source: Home / Self Care | Admitting: Emergency Medicine

## 2011-03-28 IMAGING — CR DG ABDOMEN 1V
1 series · 1 of 1 positions shown · non-contrast
Comparison: [DATE]

CLINICAL DATA: Left-sided abdominal pain.

EXAM:
ABDOMEN - 1 VIEW

[view not recorded]
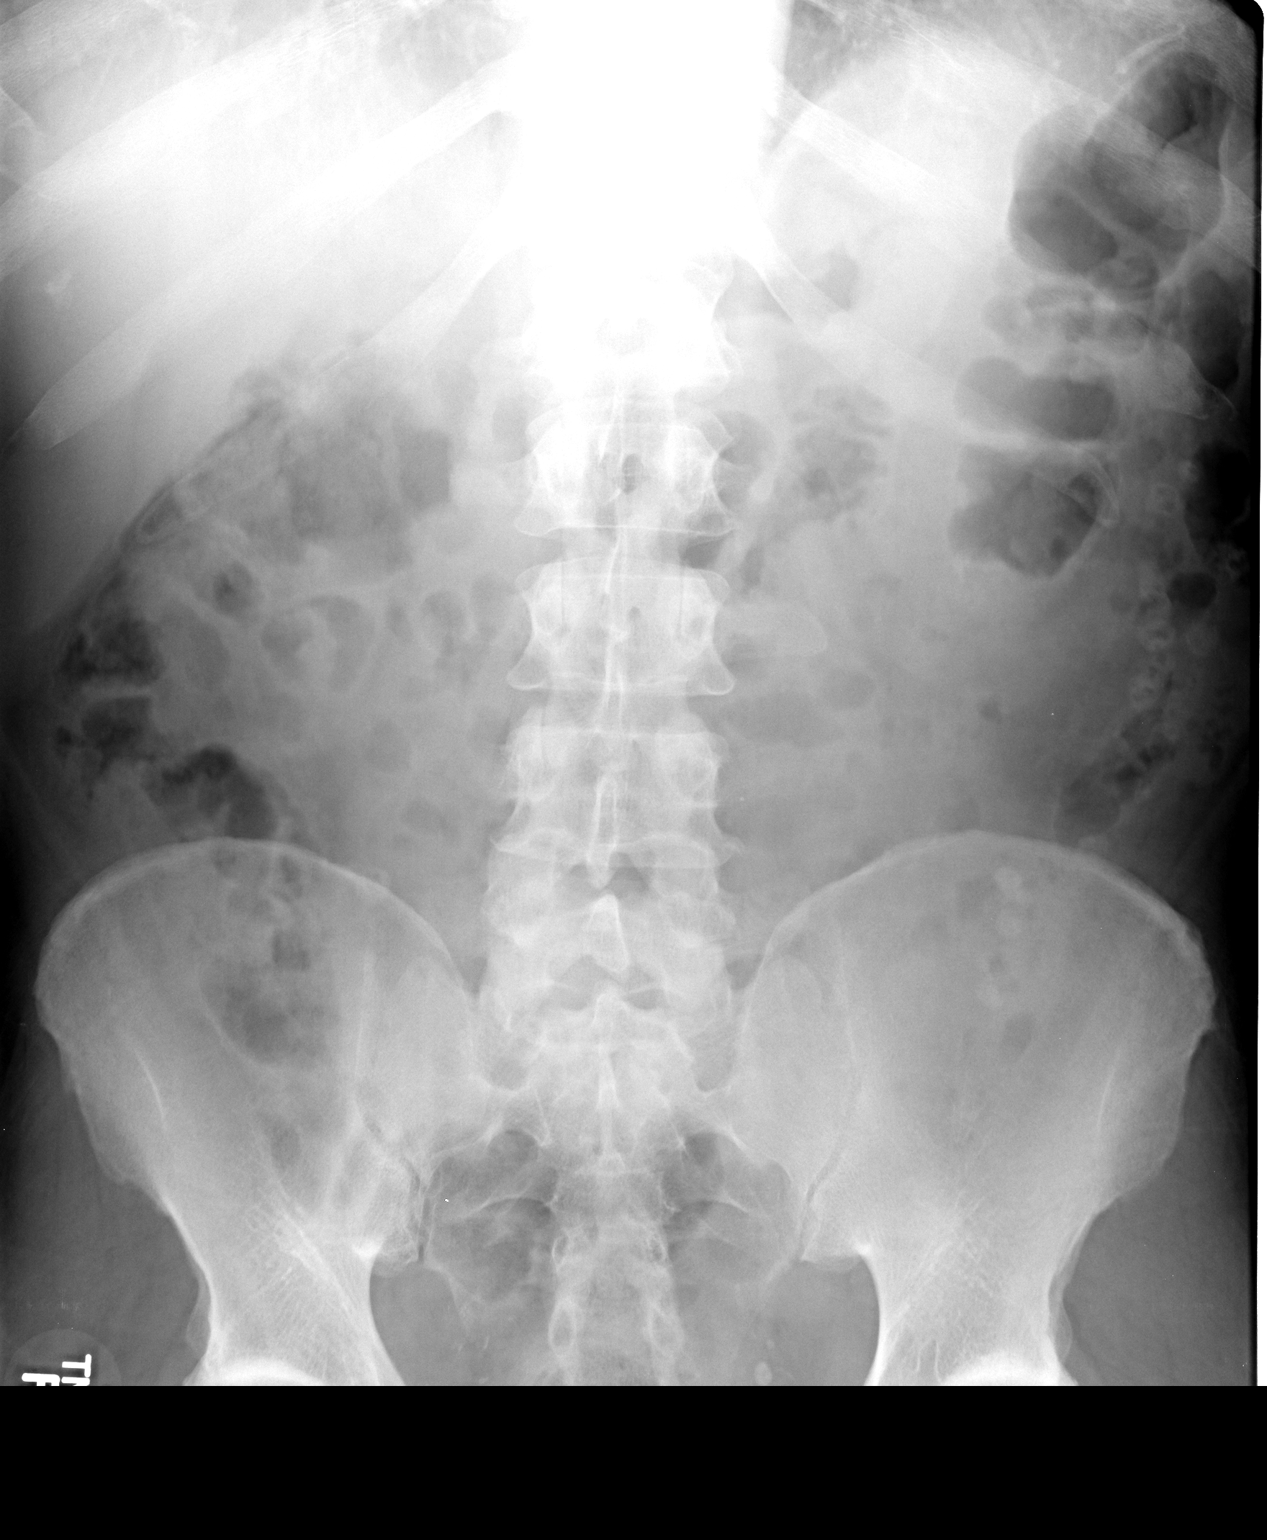

[1 of 1 positions shown; findings below may reference images not displayed]

FINDINGS: Calcified phleboliths in the anatomic pelvis, stable. There is a non
obstructive bowel gas pattern. No supine evidence of free air. No
organomegaly or suspicious calcification.No acute bony abnormality.
IMPRESSION: No acute findings.

## 2013-01-06 ENCOUNTER — Encounter (HOSPITAL_COMMUNITY): Payer: Self-pay | Admitting: *Deleted

## 2013-01-06 ENCOUNTER — Emergency Department (HOSPITAL_COMMUNITY): Payer: Self-pay

## 2013-01-06 ENCOUNTER — Observation Stay (HOSPITAL_COMMUNITY)
Admission: EM | Admit: 2013-01-06 | Discharge: 2013-01-09 | Disposition: A | Discharge status: Home / Self Care | Payer: Self-pay | Attending: Internal Medicine | Admitting: Internal Medicine

## 2013-01-06 DIAGNOSIS — N390 Urinary tract infection, site not specified: Secondary | ICD-10-CM | POA: Diagnosis present

## 2013-01-06 DIAGNOSIS — R109 Unspecified abdominal pain: Secondary | ICD-10-CM | POA: Insufficient documentation

## 2013-01-06 DIAGNOSIS — E871 Hypo-osmolality and hyponatremia: Secondary | ICD-10-CM | POA: Diagnosis present

## 2013-01-06 DIAGNOSIS — I10 Essential (primary) hypertension: Secondary | ICD-10-CM | POA: Insufficient documentation

## 2013-01-06 DIAGNOSIS — E785 Hyperlipidemia, unspecified: Secondary | ICD-10-CM | POA: Insufficient documentation

## 2013-01-06 DIAGNOSIS — D72829 Elevated white blood cell count, unspecified: Secondary | ICD-10-CM | POA: Diagnosis not present

## 2013-01-06 DIAGNOSIS — F17200 Nicotine dependence, unspecified, uncomplicated: Secondary | ICD-10-CM | POA: Insufficient documentation

## 2013-01-06 DIAGNOSIS — N23 Unspecified renal colic: Secondary | ICD-10-CM | POA: Diagnosis present

## 2013-01-06 DIAGNOSIS — E7800 Pure hypercholesterolemia, unspecified: Secondary | ICD-10-CM | POA: Insufficient documentation

## 2013-01-06 DIAGNOSIS — N1330 Unspecified hydronephrosis: Secondary | ICD-10-CM | POA: Insufficient documentation

## 2013-01-06 DIAGNOSIS — N200 Calculus of kidney: Principal | ICD-10-CM | POA: Diagnosis present

## 2013-01-06 HISTORY — DX: Essential (primary) hypertension: I10

## 2013-01-06 HISTORY — DX: Pure hypercholesterolemia, unspecified: E78.00

## 2013-01-06 LAB — URINE MICROSCOPIC-ADD ON

## 2013-01-06 LAB — URINALYSIS, ROUTINE W REFLEX MICROSCOPIC
Glucose, UA: NEGATIVE mg/dL
Protein, ur: 100 mg/dL — AB
Urobilinogen, UA: 0.2 mg/dL (ref 0.0–1.0)

## 2013-01-06 LAB — COMPREHENSIVE METABOLIC PANEL
BUN: 12 mg/dL (ref 6–23)
CO2: 24 mEq/L (ref 19–32)
Calcium: 8.8 mg/dL (ref 8.4–10.5)
Creatinine, Ser: 1.09 mg/dL (ref 0.50–1.35)
GFR calc Af Amer: >90 mL/min (ref >90)
GFR calc non Af Amer: 79 mL/min — ABNORMAL LOW (ref >90)
Glucose, Bld: 92 mg/dL (ref 70–99)
Sodium: 132 mEq/L — ABNORMAL LOW (ref 135–145)
Total Protein: 7.5 g/dL (ref 6.0–8.3)

## 2013-01-06 LAB — CBC
Hemoglobin: 14.9 g/dL (ref 13.0–17.0)
MCH: 30.2 pg (ref 26.0–34.0)
MCHC: 34.3 g/dL (ref 30.0–36.0)
MCV: 88.1 fL (ref 78.0–100.0)
RBC: 4.94 MIL/uL (ref 4.22–5.81)

## 2013-01-06 LAB — LACTIC ACID, PLASMA: Lactic Acid, Venous: 1.5 mmol/L (ref 0.5–2.2)

## 2013-01-06 LAB — LIPASE, BLOOD: Lipase: 43 U/L (ref 11–59)

## 2013-01-06 IMAGING — CT CT ABD-PELV W/ CM
2 of 5 series · 16 of 46 positions shown, 18 images · IV contrast (Omnipaque 300)
Comparison: None.

CLINICAL DATA: Abdominal pain

CT ABDOMEN AND PELVIS WITH CONTRAST
TECHNIQUE: Multidetector CT imaging of the abdomen and pelvis was
performed following the standard protocol during bolus
administration of intravenous contrast.
Contrast: 50mL OMNIPAQUE IOHEXOL 300 MG/ML  SOLN, 100mL OMNIPAQUE
IOHEXOL 300 MG/ML  SOLN

[Series 2: abd_pel_with 5.0 b40f · axial · 0.69mm/px · z∈[-280,+120]mm · 13 of 90 slices shown, 15 images]
[im 5/90  soft-tissue]
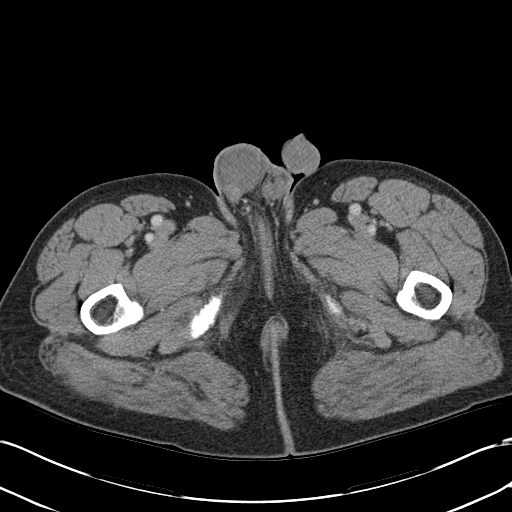
[im 5/90  bone]
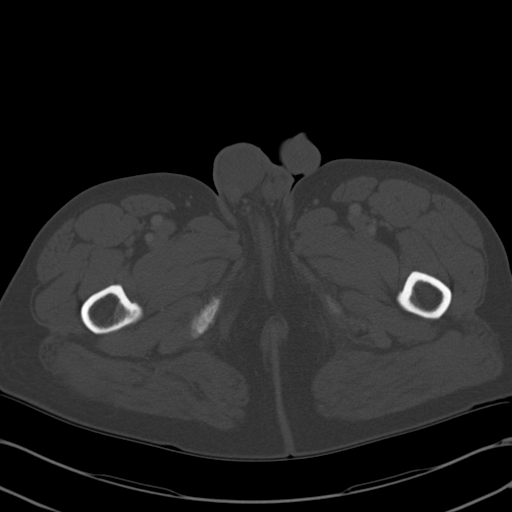
[im 15/90  soft-tissue]
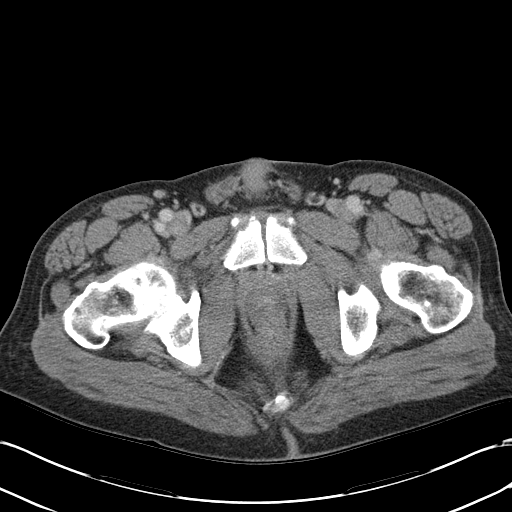
[im 19/90  soft-tissue]
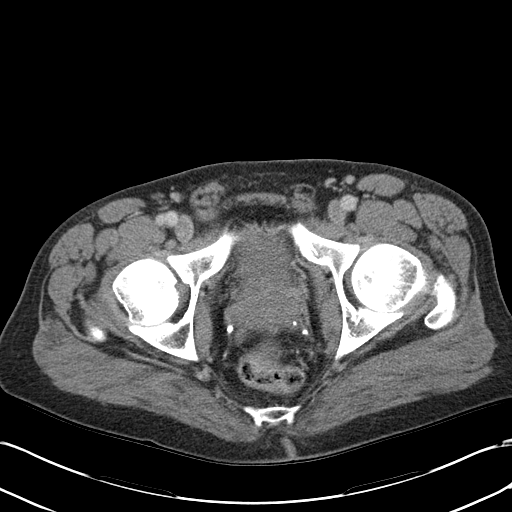
[im 24/90  soft-tissue]
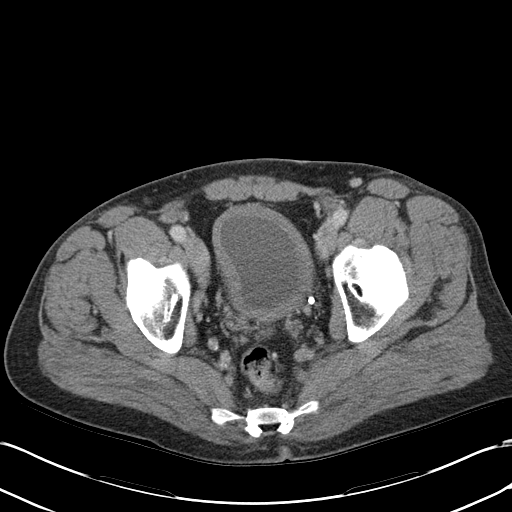
[im 33/90  soft-tissue]
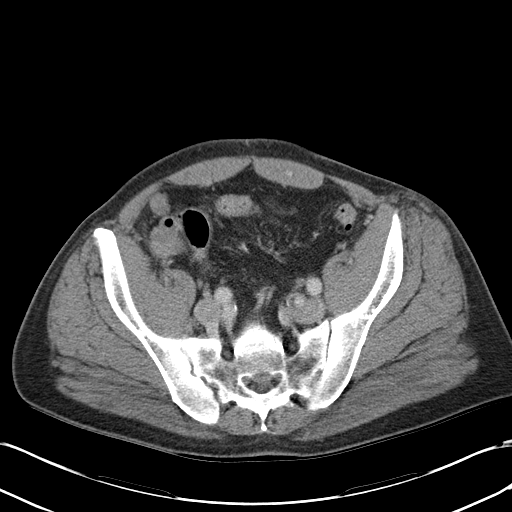
[im 38/90  soft-tissue]
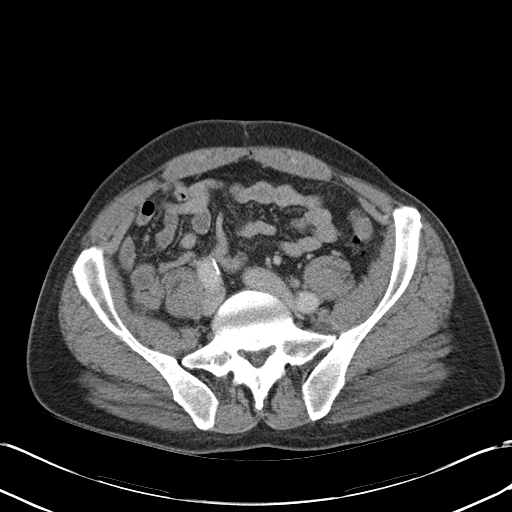
[im 47/90  soft-tissue]
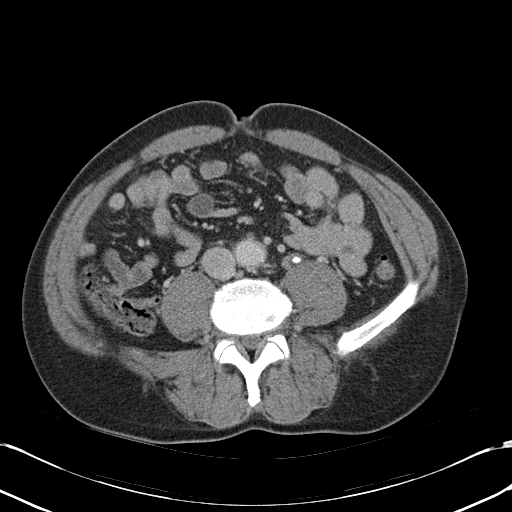
[im 52/90  soft-tissue]
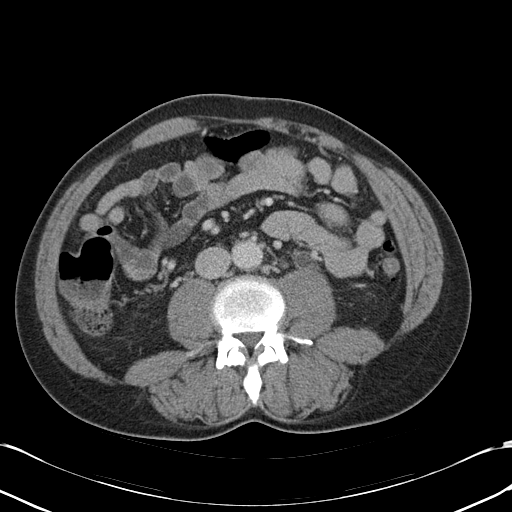
[im 57/90  soft-tissue]
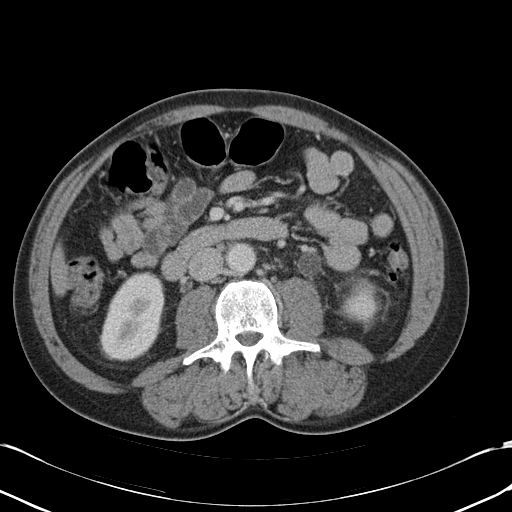
[im 57/90  bone]
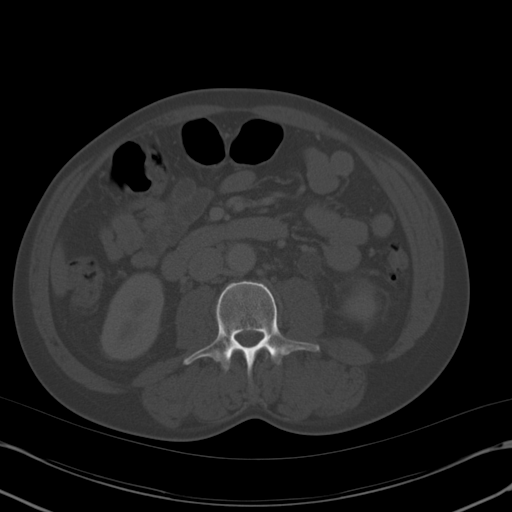
[im 66/90  soft-tissue]
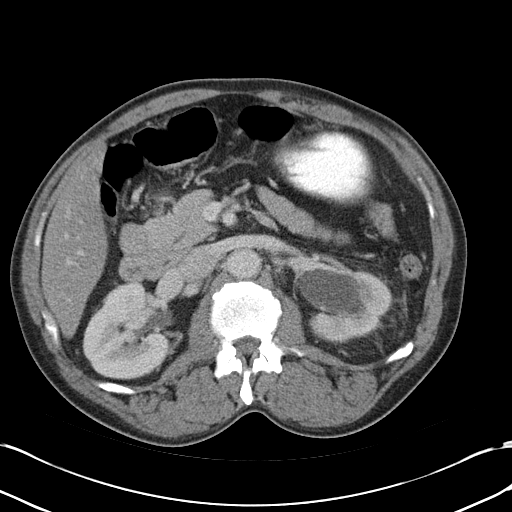
[im 71/90  soft-tissue]
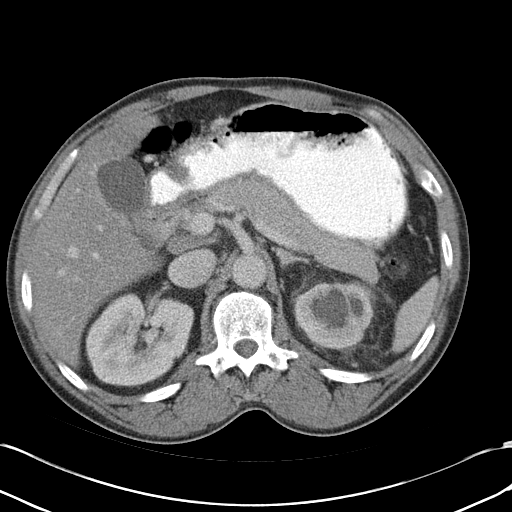
[im 75/90  soft-tissue]
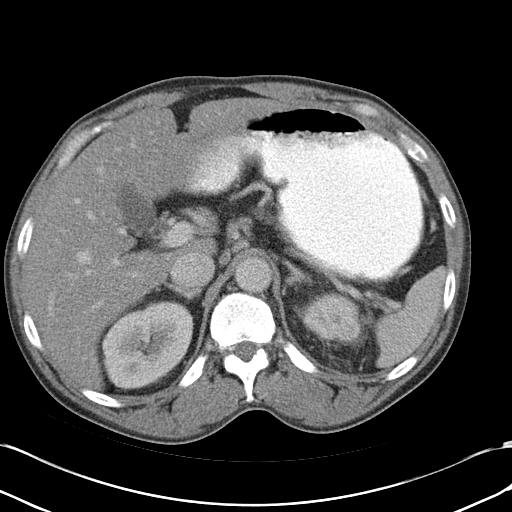
[im 85/90  soft-tissue]
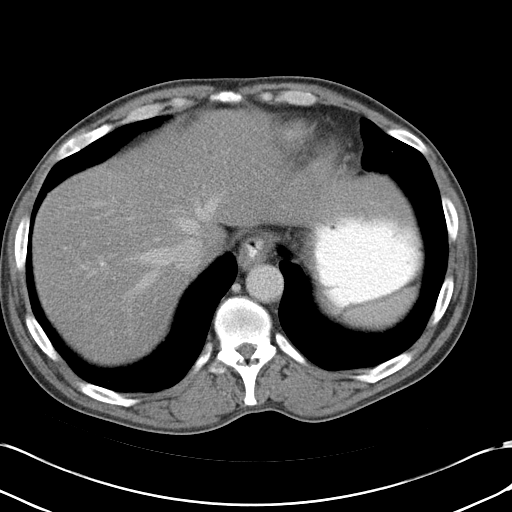

[Series 4: abd_pel_with 3.0 spo · coronal · 0.64mm/px · 3 of 82 slices shown]
[im 28/82  soft-tissue]
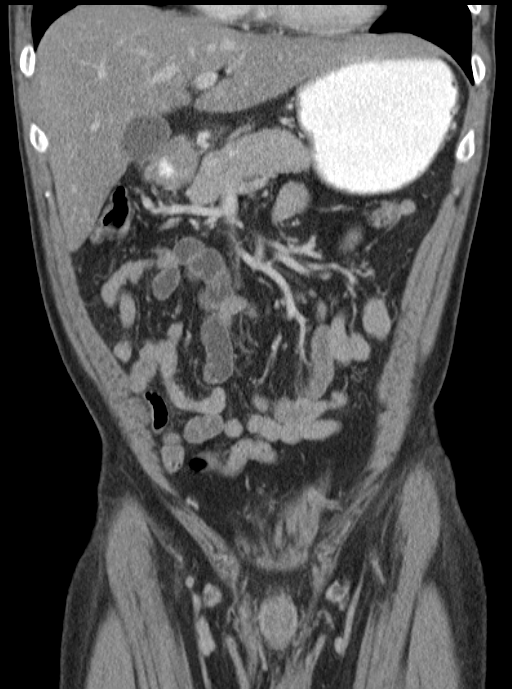
[im 37/82  soft-tissue]
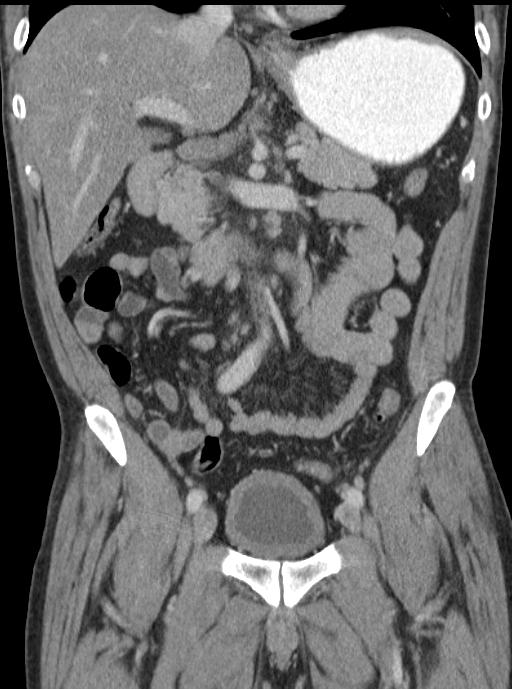
[im 46/82  soft-tissue]
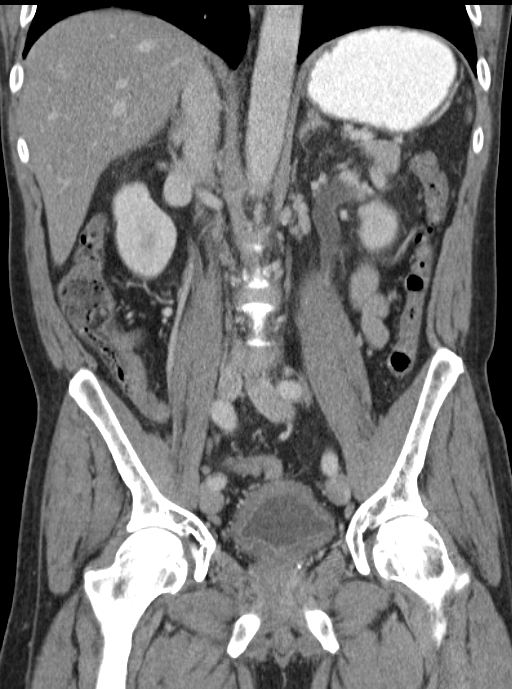

[16 of 46 positions shown; findings below may reference images not displayed]

FINDINGS: The lung bases appear clear.  No pleural or pericardial effusion.

There are no focal liver abnormalities identified.  Mild diffuse
low attenuation within the liver parenchyma suggest hepatic
steatosis.  The gallbladder is normal.  No biliary dilatation.
Normal appearance of the pancreas and spleen.

The adrenal glands are both normal.  Right kidney is normal.  Edema
of the left kidney with perinephric fat stranding is noted.  There
is marked left hydronephrosis and hydroureter.  Within the mid left
ureter there is a stone measuring 6 mm, image 44/series 2.  There
is mild diffuse wall thickening of the urinary bladder.  Prostate
gland is normal.

There is calcified atherosclerotic disease involving the abdominal
aorta.  No aneurysm.  Multiple upper abdominal lymph nodes are
identified.  No adenopathy however.  There is no pelvic or inguinal
adenopathy noted.

No free fluid or fluid collections within the abdomen or pelvis.
The small hiatal hernia is noted.  The small bowel loops have a
normal course and caliber without obstruction.  Normal appearance
of the proximal colon.  There are multiple distal colonic
diverticula.

Review of the visualized osseous structures is unremarkable.
IMPRESSION: 1.  Left-sided obstructive uropathy secondary to 6 mm mid left
ureteral calculus.
2.  Bladder wall thickening is nonspecific and may reflect
inflammation or sequelae of chronic bladder outlet obstruction.
3.  Hepatic steatosis.

## 2013-01-06 MED ORDER — HYDRALAZINE HCL 20 MG/ML IJ SOLN: 10.0000 mg | INTRAMUSCULAR | Status: DC | PRN

## 2013-01-06 MED ORDER — IOHEXOL 300 MG/ML  SOLN
100.0000 mL | Freq: Once | INTRAMUSCULAR | Status: AC | PRN
  Administered 2013-01-06: 100 mL via INTRAVENOUS

## 2013-01-06 MED ORDER — ACETAMINOPHEN 325 MG PO TABS: 650.0000 mg | ORAL_TABLET | ORAL | Status: DC | PRN

## 2013-01-06 MED ORDER — MORPHINE SULFATE 4 MG/ML IJ SOLN
4.0000 mg | Freq: Once | INTRAMUSCULAR | Status: AC
  Administered 2013-01-06: 4 mg via INTRAVENOUS
  Filled 2013-01-06: qty 1

## 2013-01-06 MED ORDER — ALBUTEROL SULFATE (5 MG/ML) 0.5% IN NEBU: 2.5000 mg | INHALATION_SOLUTION | Freq: Four times a day (QID) | RESPIRATORY_TRACT | Status: DC | PRN

## 2013-01-06 MED ORDER — ONDANSETRON HCL 4 MG/2ML IJ SOLN
4.0000 mg | Freq: Once | INTRAMUSCULAR | Status: AC
  Administered 2013-01-06: 4 mg via INTRAVENOUS
  Filled 2013-01-06: qty 2

## 2013-01-06 MED ORDER — HYDROMORPHONE HCL PF 1 MG/ML IJ SOLN
1.0000 mg | INTRAMUSCULAR | Status: DC | PRN
  Administered 2013-01-06 – 2013-01-07 (×2): 2 mg via INTRAVENOUS
  Administered 2013-01-07: 1 mg via INTRAVENOUS
  Administered 2013-01-07 – 2013-01-08 (×7): 2 mg via INTRAVENOUS
  Filled 2013-01-06: qty 1
  Filled 2013-01-06 (×9): qty 2

## 2013-01-06 MED ORDER — DEXTROSE 5 % IV SOLN
1.0000 g | Freq: Once | INTRAVENOUS | Status: AC
  Administered 2013-01-06: 1 g via INTRAVENOUS
  Filled 2013-01-06: qty 10

## 2013-01-06 MED ORDER — IOHEXOL 300 MG/ML  SOLN
50.0000 mL | Freq: Once | INTRAMUSCULAR | Status: AC | PRN
  Administered 2013-01-06: 50 mL via ORAL

## 2013-01-06 MED ORDER — POTASSIUM CHLORIDE IN NACL 20-0.9 MEQ/L-% IV SOLN
INTRAVENOUS | Status: DC
  Administered 2013-01-07 – 2013-01-08 (×5): via INTRAVENOUS

## 2013-01-06 MED ORDER — FLEET ENEMA 7-19 GM/118ML RE ENEM: 1.0000 | ENEMA | Freq: Once | RECTAL | Status: AC | PRN

## 2013-01-06 MED ORDER — ONDANSETRON HCL 4 MG/2ML IJ SOLN: 4.0000 mg | INTRAMUSCULAR | Status: DC | PRN

## 2013-01-06 MED ORDER — SODIUM CHLORIDE 0.9 % IV BOLUS (SEPSIS)
1000.0000 mL | Freq: Once | INTRAVENOUS | Status: AC
  Administered 2013-01-06: 1000 mL via INTRAVENOUS

## 2013-01-06 MED ORDER — BISACODYL 10 MG RE SUPP: 10.0000 mg | Freq: Every day | RECTAL | Status: DC | PRN

## 2013-01-06 NOTE — H&P (Signed)
[facility] History and Physical  [name] [name]  WUJ:[id number]  DOB: 09-11-1965   DOA: 01/06/2013   PCP:   No primary provider on file.   Chief Complaint:  Abdominal pain since today  HPI: [name] [name] is a 47 y.o. male.  Middle-aged [place] American gentleman gives a history of low-grade intermittent colicky abdominal pain since this morning. Was able to go shopping with a relative but then in the afternoon develops severe recurrence of intense left flank colicky pain associated with urinary frequency and dysuria and he call the relatives to bring him to hospital.  In the ED CT scan left ureteral obstruction by stone.  Patient has a history of hyperlipidemia and hypertension but does not know the name of his medications  Patient is in severe pain at the time of this history   Rewiew of Systems:   All systems negative except as marked bold or noted in the HPI;  Constitutional:    malaise, fever and chills. ;  Eyes:   eye pain, redness and discharge. ;  ENMT:   ear pain, hoarseness, nasal congestion, sinus pressure and sore throat. ;  Cardiovascular:    chest pain, palpitations, diaphoresis, dyspnea and peripheral edema.  Respiratory:   cough, hemoptysis, wheezing and stridor. ;  Gastrointestinal:  nausea, vomiting, diarrhea, constipation, abdominal pain, melena, blood in stool, hematemesis, jaundice and rectal bleeding. unusual weight loss..   Genitourinary:    frequency, dysuria, incontinence,flank pain and hematuria; Musculoskeletal:   back pain and neck pain.  swelling and trauma.;  Skin: .  pruritus, rash, abrasions, bruising and skin lesion.; ulcerations Neuro:    headache, lightheadedness and neck stiffness.  weakness, altered level of consciousness, altered mental status, extremity weakness, burning feet, involuntary movement, seizure and syncope.  Psych:    anxiety, depression, insomnia, tearfulness, panic attacks, hallucinations, paranoia, suicidal or homicidal ideation     Past Medical History  Diagnosis Date  . Hypertension   . High cholesterol     Past Surgical History  Procedure Laterality Date  . Fracture surgery    . Appendectomy      Medications:  HOME MEDS: Prior to Admission medications   Medication Sig Start Date End Date Taking? Authorizing Provider  PRESCRIPTION MEDICATION Take 1 tablet by mouth at bedtime. Cholesterol medication   Yes Historical Provider, MD  PRESCRIPTION MEDICATION Take 1 tablet by mouth at bedtime. Blood pressure medication   Yes Historical Provider, MD     Allergies:  No Known Allergies  Social History:   reports that he has been smoking Cigarettes.  He has been smoking about 0.00 packs per day. He does not have any smokeless tobacco history on file. He reports that  drinks alcohol. He reports that he does not use illicit drugs.  Family History: No family history on file.   Physical Exam: Filed Vitals:   01/06/13 1855 01/06/13 2219  BP: 165/101 187/116  Pulse: 89 107  Temp: 97.8 F (36.6 C) 99.7 F (37.6 C)  TempSrc: Oral Oral  Resp: 16 20  Height: 6' (1.829 m)   Weight: 82.555 kg (182 lb)   SpO2: 100% 98%   Blood pressure 187/116, pulse 107, temperature 99.7 F (37.6 C), temperature source Oral, resp. rate 20, height 6' (1.829 m), weight 82.555 kg (182 lb), SpO2 98.00%. Body mass index is 24.68 kg/(m^2).   GEN:  Pleasant African American lying bed in painful distress despite narcotic; cooperative with exam PSYCH:  alert and oriented x4;  ; affect  is appropriate. HEENT: Mucous membranes pink and anicteric; PERRLA; EOM intact; no cervical lymphadenopathy nor thyromegaly or carotid bruit; no JVD; Breasts:: Not examined CHEST WALL: No tenderness CHEST: Normal respiration, clear to auscultation bilaterally HEART: Tachycardic regular rhythm; no murmurs rubs or gallops BACK: No kyphosis no scoliosis; left flank tenderness ABDOMEN: soft non-tender; no masses, no organomegaly, normal abdominal  bowel sounds; Rectal Exam: Not done EXTREMITIES:  age-appropriate arthropathy of the hands and knees; no edema; no ulcerations. Genitalia: not examined PULSES: 2+ and symmetric SKIN: Normal hydration no rash or ulceration CNS: Cranial nerves 2-12 grossly intact no focal lateralizing neurologic deficit   Labs on Admission:  Basic Metabolic Panel:  Recent Labs Lab 01/06/13 1956  NA 132*  K 3.5  CL 96  CO2 24  GLUCOSE 92  BUN 12  CREATININE 1.09  CALCIUM 8.8   Liver Function Tests:  Recent Labs Lab 01/06/13 1956  AST 54*  ALT 37  ALKPHOS 58  BILITOT 0.3  PROT 7.5  ALBUMIN 3.2*    Recent Labs Lab 01/06/13 1956  LIPASE 43   No results found for this basename: AMMONIA,  in the last 168 hours CBC:  Recent Labs Lab 01/06/13 1956  WBC 10.4  HGB 14.9  HCT 43.5  MCV 88.1  PLT 197   Cardiac Enzymes: No results found for this basename: CKTOTAL, CKMB, CKMBINDEX, TROPONINI,  in the last 168 hours BNP: No components found with this basename: POCBNP,  D-dimer: No components found with this basename: D-DIMER,  CBG: No results found for this basename: GLUCAP,  in the last 168 hours  Radiological Exams on Admission: Ct Abdomen Pelvis W Contrast  01/06/2013   *RADIOLOGY REPORT*  Clinical Data: Abdominal pain  CT ABDOMEN AND PELVIS WITH CONTRAST  Technique:  Multidetector CT imaging of the abdomen and pelvis was performed following the standard protocol during bolus administration of intravenous contrast.  Contrast: 50mL OMNIPAQUE IOHEXOL 300 MG/ML  SOLN, OMNIPAQUE IOHEXOL 300 MG/ML  SOLN  Comparison: None.  Findings:  The lung bases appear clear.  No pleural or pericardial effusion.  There are no focal liver abnormalities identified.  Mild diffuse low attenuation within the liver parenchyma suggest hepatic steatosis.  The gallbladder is normal.  No biliary dilatation. Normal appearance of the pancreas and spleen.  The adrenal glands are both normal.  Right kidney is  normal.  Edema of the left kidney with perinephric fat stranding is noted.  There is marked left hydronephrosis and hydroureter.  Within the mid left ureter there is a stone measuring 6 mm, image 44/series 2.  There is mild diffuse wall thickening of the urinary bladder.  Prostate gland is normal.  There is calcified atherosclerotic disease involving the abdominal aorta.  No aneurysm.  Multiple upper abdominal lymph nodes are identified.  No adenopathy however.  There is no pelvic or inguinal adenopathy noted.  No free fluid or fluid collections within the abdomen or pelvis. The small hiatal hernia is noted.  The small bowel loops have a normal course and caliber without obstruction.  Normal appearance of the proximal colon.  There are multiple distal colonic diverticula.  Review of the visualized osseous structures is unremarkable.  IMPRESSION:  1.  Left-sided obstructive uropathy secondary to 6 mm mid left ureteral calculus. 2.  Bladder wall thickening is nonspecific and may reflect inflammation or sequelae of chronic bladder outlet obstruction. 3.  Hepatic steatosis.   Original Report Authenticated By: Signa Kell, M.D.     Assessment/Plan  Active Problems:   UTI (urinary tract infection)   Kidney stone   Ureteral colic Hypertension   PLAN: Admit for vigorous IV fluid therapy Antibiotics pending urine culture results Intravenous narcotics for pain When necessary hydralazine for blood pressure control Urology consult for possible stent  Other plans as per orders.  Code Status: Full code Family Communication:  Plans discuss with patient and aunt at bedside Disposition Plan:  Likely home when pain resolved    Kenishia Plack Nocturnist [facility] Pager [phone]   01/06/2013, 10:33 PM

## 2013-01-06 NOTE — ED Notes (Signed)
Pt c/o severe left-sided abdominal pain with nausea and chills starting today, denies any episodes of emesis. Patient is visibly shaking under the blanket on the stretcher.

## 2013-01-06 NOTE — ED Provider Notes (Signed)
CSN: [id number]     Arrival date & time 01/06/13  1846 History   First MD Initiated Contact with Patient 01/06/13 1916     Chief Complaint  Patient presents with  . Abdominal Pain   (Consider location/radiation/quality/duration/timing/severity/associated sxs/prior Treatment) Patient is a 47 y.o. male presenting with abdominal pain. The history is provided by the patient.  Abdominal Pain Pain location:  L flank Pain quality: cramping   Pain quality: not sharp and not tugging   Pain radiates to:  L flank and RLQ Pain severity:  Moderate Onset quality:  Sudden Timing:  Intermittent Progression:  Worsening Chronicity:  Recurrent (Had once before - kidney infection) Context: not alcohol use, not recent travel, not sick contacts and not trauma   Relieved by:  Nothing Worsened by:  Nothing tried Ineffective treatments:  None tried Associated symptoms: anorexia, chills, nausea and vomiting   Associated symptoms: no diarrhea, no fever, no flatus and no hematuria     Past Medical History  Diagnosis Date  . Hypertension   . High cholesterol    Past Surgical History  Procedure Laterality Date  . Fracture surgery     No family history on file. History  Substance Use Topics  . Smoking status: Current Every Day Smoker    Types: Cigarettes  . Smokeless tobacco: Not on file  . Alcohol Use: Yes     Comment: occasional    Review of Systems  Constitutional: Positive for chills. Negative for fever.  Gastrointestinal: Positive for nausea, vomiting, abdominal pain and anorexia. Negative for diarrhea and flatus.  Genitourinary: Negative for hematuria.  All other systems reviewed and are negative.    Allergies  Review of patient's allergies indicates no known allergies.  Home Medications   Current Outpatient Rx  Name  Route  Sig  Dispense  Refill  . PRESCRIPTION MEDICATION   Oral   Take 1 tablet by mouth at bedtime. Cholesterol medication         . PRESCRIPTION MEDICATION    Oral   Take 1 tablet by mouth at bedtime. Blood pressure medication          BP 165/101  Pulse 89  Temp(Src) 97.8 F (36.6 C) (Oral)  Resp 16  Ht 6' (1.829 m)  Wt 182 lb (82.555 kg)  BMI 24.68 kg/m2  SpO2 100% Physical Exam  Nursing note and vitals reviewed. Constitutional: He is oriented to person, place, and time. He appears well-developed and well-nourished. No distress.  HENT:  Head: Normocephalic and atraumatic.  Mouth/Throat: No oropharyngeal exudate.  Eyes: EOM are normal. Pupils are equal, round, and reactive to light.  Neck: Normal range of motion. Neck supple.  Cardiovascular: Normal rate and regular rhythm.  Exam reveals no friction rub.   No murmur heard. Pulmonary/Chest: Effort normal and breath sounds normal. No respiratory distress. He has no wheezes. He has no rales.  Abdominal: He exhibits no distension. There is tenderness (L flank, central abdomen, RLQ). There is guarding. There is no rebound.  L CVA tenderness  Genitourinary: Testes normal and penis normal.  Musculoskeletal: Normal range of motion. He exhibits no edema.  Neurological: He is alert and oriented to person, place, and time.  Skin: He is not diaphoretic.    ED Course  Procedures (including critical care time) Labs Review Labs Reviewed  COMPREHENSIVE METABOLIC PANEL - Abnormal; Notable for the following:    Sodium 132 (*)    Albumin 3.2 (*)    AST 54 (*)    GFR  calc non Af Amer 79 (*)    All other components within normal limits  URINALYSIS, ROUTINE W REFLEX MICROSCOPIC - Abnormal; Notable for the following:    APPearance CLOUDY (*)    Hgb urine dipstick TRACE (*)    Ketones, ur TRACE (*)    Protein, ur 100 (*)    Nitrite POSITIVE (*)    Leukocytes, UA SMALL (*)    All other components within normal limits  URINE MICROSCOPIC-ADD ON - Abnormal; Notable for the following:    Squamous Epithelial / LPF FEW (*)    Bacteria, UA MANY (*)    All other components within normal limits   URINE CULTURE  CBC  LIPASE, BLOOD  LACTIC ACID, PLASMA   Imaging Review Ct Abdomen Pelvis W Contrast  01/06/2013   *RADIOLOGY REPORT*  Clinical Data: Abdominal pain  CT ABDOMEN AND PELVIS WITH CONTRAST  Technique:  Multidetector CT imaging of the abdomen and pelvis was performed following the standard protocol during bolus administration of intravenous contrast.  Contrast: 50mL OMNIPAQUE IOHEXOL 300 MG/ML  SOLN, OMNIPAQUE IOHEXOL 300 MG/ML  SOLN  Comparison: None.  Findings:  The lung bases appear clear.  No pleural or pericardial effusion.  There are no focal liver abnormalities identified.  Mild diffuse low attenuation within the liver parenchyma suggest hepatic steatosis.  The gallbladder is normal.  No biliary dilatation. Normal appearance of the pancreas and spleen.  The adrenal glands are both normal.  Right kidney is normal.  Edema of the left kidney with perinephric fat stranding is noted.  There is marked left hydronephrosis and hydroureter.  Within the mid left ureter there is a stone measuring 6 mm, image 44/series 2.  There is mild diffuse wall thickening of the urinary bladder.  Prostate gland is normal.  There is calcified atherosclerotic disease involving the abdominal aorta.  No aneurysm.  Multiple upper abdominal lymph nodes are identified.  No adenopathy however.  There is no pelvic or inguinal adenopathy noted.  No free fluid or fluid collections within the abdomen or pelvis. The small hiatal hernia is noted.  The small bowel loops have a normal course and caliber without obstruction.  Normal appearance of the proximal colon.  There are multiple distal colonic diverticula.  Review of the visualized osseous structures is unremarkable.  IMPRESSION:  1.  Left-sided obstructive uropathy secondary to 6 mm mid left ureteral calculus. 2.  Bladder wall thickening is nonspecific and may reflect inflammation or sequelae of chronic bladder outlet obstruction. 3.  Hepatic steatosis.   Original  Report Authenticated By: Signa Kell, M.D.    MDM   1. Kidney stone   2. UTI (urinary tract infection)   3. Abdominal pain     47 year old male presents with left-sided abdominal pain, nausea, chills and began today. Vomiting x2. No diarrhea. Had this once before stated as a kidney infection. Here afebrile, vital signs are stable. He has left flank, left CVA, central abdomen, and right lower quadrant tenderness. He is is some guarding on abdominal exam. Normal GU exam. Concern for possible kidney infection versus other intra-abdominal infection. Will scan and check labs. Fluids and pain meds and antiemetics given. UA c/w UTI. Rocephin given. CT scan reviewed by me - shows L hydroureter/hydronephrosis and 6 mm ureteric stone on the L. Urology consulted, will see in consult. Medicine admitting.   I have reviewed all labs and imaging and considered them in my medical decision making.    Dagmar Hait, MD 01/06/13  2213 

## 2013-01-06 NOTE — ED Notes (Signed)
Left sided abd pain with nausea and chills starting today.

## 2013-01-06 NOTE — ED Notes (Addendum)
Pt resting in bed, states he is cold. Given a warm blanket.

## 2013-01-07 ENCOUNTER — Encounter (HOSPITAL_COMMUNITY): Payer: Self-pay | Admitting: *Deleted

## 2013-01-07 ENCOUNTER — Observation Stay (HOSPITAL_COMMUNITY): Payer: Self-pay

## 2013-01-07 DIAGNOSIS — D72829 Elevated white blood cell count, unspecified: Secondary | ICD-10-CM | POA: Diagnosis not present

## 2013-01-07 DIAGNOSIS — N200 Calculus of kidney: Secondary | ICD-10-CM

## 2013-01-07 DIAGNOSIS — E871 Hypo-osmolality and hyponatremia: Secondary | ICD-10-CM | POA: Diagnosis present

## 2013-01-07 LAB — CBC
HCT: 41.8 % (ref 39.0–52.0)
MCHC: 34 g/dL (ref 30.0–36.0)
MCV: 88.4 fL (ref 78.0–100.0)
RDW: 14.4 % (ref 11.5–15.5)

## 2013-01-07 LAB — BASIC METABOLIC PANEL
BUN: 12 mg/dL (ref 6–23)
CO2: 21 mEq/L (ref 19–32)
Chloride: 101 mEq/L (ref 96–112)
Creatinine, Ser: 1.21 mg/dL (ref 0.50–1.35)
GFR calc Af Amer: 81 mL/min — ABNORMAL LOW (ref >90)

## 2013-01-07 LAB — PROTIME-INR: INR: 1.08 (ref 0.00–1.49)

## 2013-01-07 IMAGING — CR DG ABDOMEN 1V
1 series · 1 of 1 positions shown · non-contrast
Comparison: CT [DATE].

CLINICAL DATA: History of urinary calculus in the left ureter.

ABDOMEN - 1 VIEW

[view not recorded]
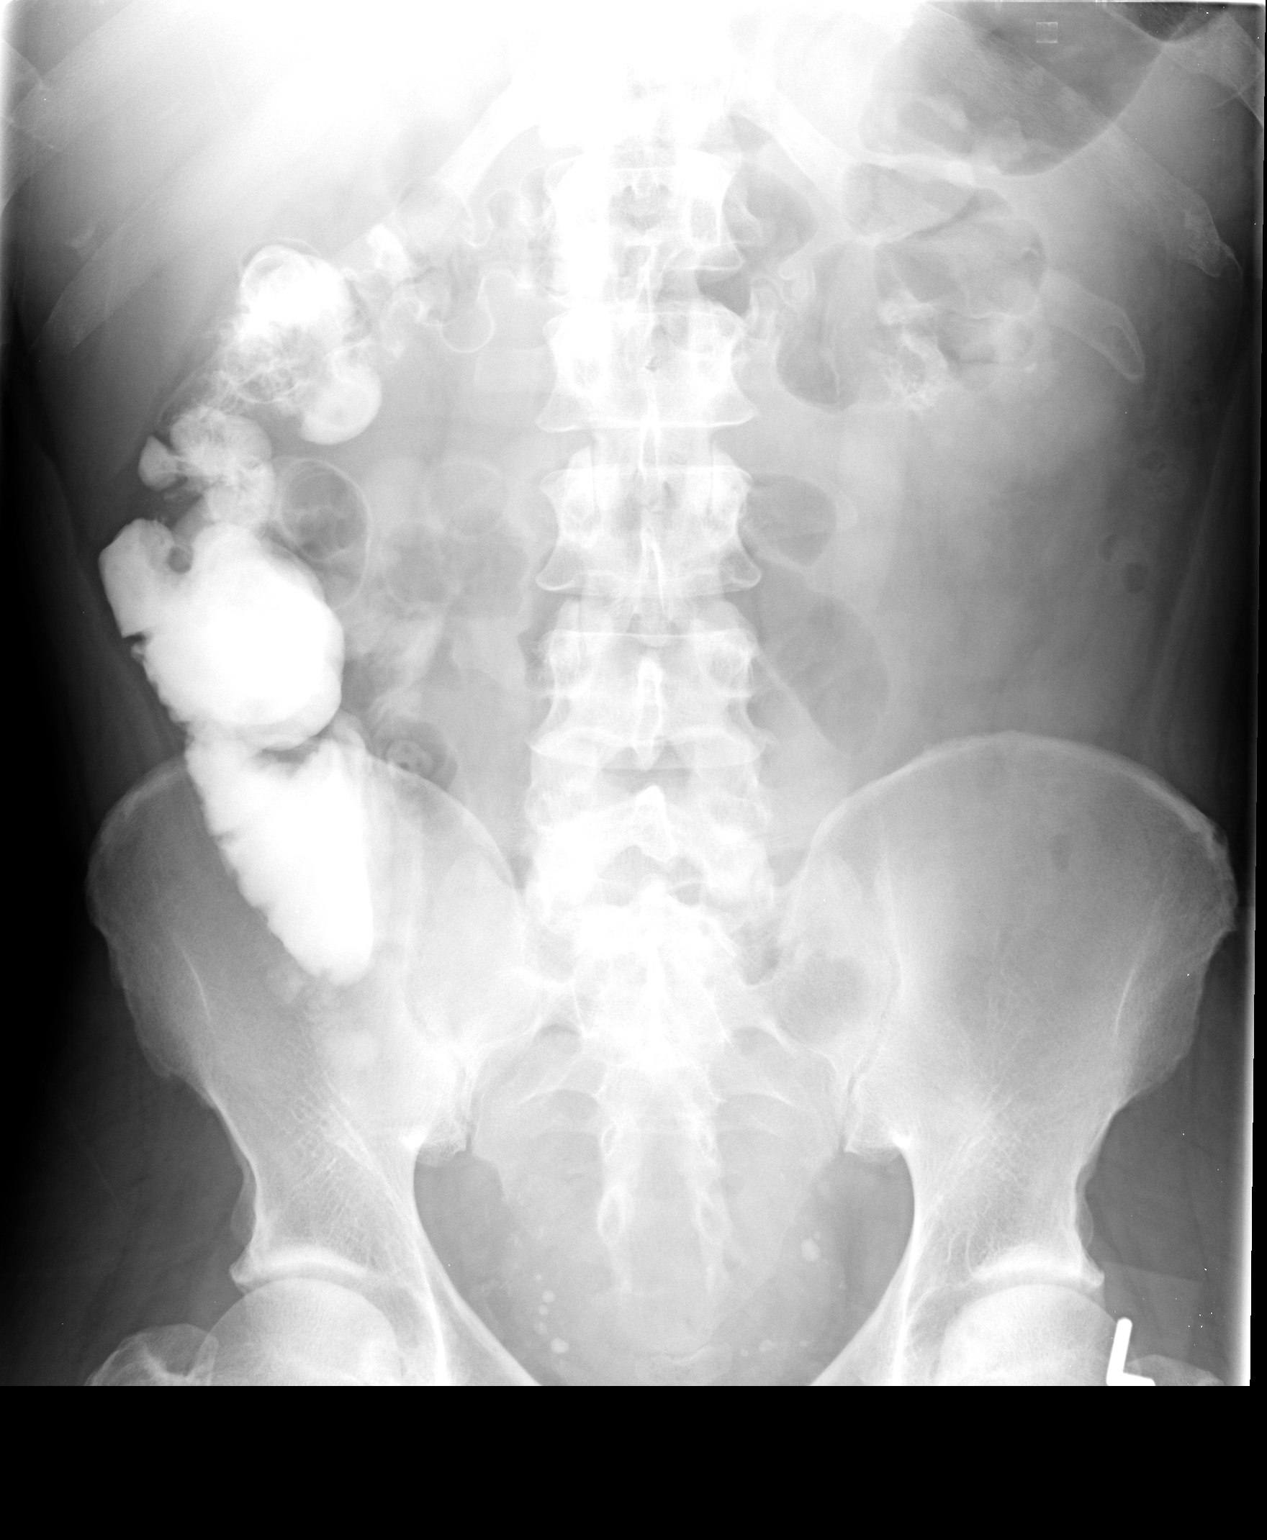

[1 of 1 positions shown; findings below may reference images not displayed]

FINDINGS: On the previous CT examination a calculus was present in
the left ureter to the left of the level of the L4 vertebra.  On
the current study I cannot definitely visualize this calculus in
the path of the ureter..  There are numerous calcifications in the
pelvis which are felt to be probably phleboliths.  I cannot exclude
one of these could reflect a calculus.  There is residual contrast
within the colon.  No skeletal lesions are seen.
IMPRESSION: On the previous CT examination a calculus was present in the left
ureter to the left of the level of the L4 vertebra.  On the current
study I cannot definitely visualize this calculus in the path of
the ureter..  There are numerous calcifications in the pelvis which
are felt to be probably phleboliths.  I cannot exclude one of these
could reflect a calculus.

## 2013-01-07 MED ORDER — DEXTROSE 5 % IV SOLN
1.0000 g | INTRAVENOUS | Status: DC
  Administered 2013-01-07 – 2013-01-08 (×2): 1 g via INTRAVENOUS
  Filled 2013-01-07 (×3): qty 10

## 2013-01-07 NOTE — Consult Note (Signed)
Note [id number]

## 2013-01-07 NOTE — Progress Notes (Signed)
Still has pain will get kub to see if stone is lucent or radiopaque

## 2013-01-07 NOTE — Progress Notes (Signed)
[facility] PROGRESS NOTE  [name] [name]:[id number] DOB: 1965-05-07 DOA: 01/06/2013 PCP: No primary provider on file.  Assessment/Plan: Kidney stone: hx of same. Pain controlled this am. Await urology consult. Will likely need stent. Continue supportive therapy and monitor.   Ureteral colic : related to #1. Will continue pain management and supportive therapy.   UTI (urinary tract infection) : rocephin day #2. Await cultures.   Hypertension: Pt reports hx of same. Currently ABP range 101-108. Will monitor.   Leukocytosis: likely related to above. Patient is afebrile and non-toxic appearing. Will monitor.   Hyponatremia: mild. Improving with IV fluids. Pt remains NPO until urology consults.    Code Status: full Family Communication: none available Disposition Plan: home when ready   Consultants:  Urology  Procedures:  none  Antibiotics:  Rocephin 01/06/13>>>  HPI/Subjective: Awake watching TV. Reports adequate pain control.   Objective: Filed Vitals:   01/07/13 0549  BP: 101/68  Pulse: 89  Temp: 98.6 F (37 C)  Resp: 19    Intake/Output Summary (Last 24 hours) at 01/07/13 0844 Last data filed at 01/07/13 0100  Gross per 24 hour  Intake    125 ml  Output      0 ml  Net    125 ml   Filed Weights   01/06/13 1855 01/06/13 2311 01/07/13 0549  Weight: 82.555 kg (182 lb) 77.3 kg (170 lb 6.7 oz) 77.8 kg (171 lb 8.3 oz)    Exam:   General:  Well nourished NAD  Cardiovascular: RRR No MGR No LE edema  Respiratory: normal effort BS clear bilaterally no wheeze  Abdomen: flat, soft Non-tender to palpation. +BS x4  Musculoskeletal: no clubbing no cyanosis ambulates in room with steady gait   Data Reviewed: Basic Metabolic Panel:  Recent Labs Lab 01/06/13 1956 01/06/13 2354 01/07/13 0435  NA 132*  --  134*  K 3.5  --  3.9  CL 96  --  101  CO2 24  --  21  GLUCOSE 92  --  97  BUN 12  --  12  CREATININE 1.09  --  1.21  CALCIUM 8.8  --  8.5  MG   --  1.6  --    Liver Function Tests:  Recent Labs Lab 01/06/13 1956  AST 54*  ALT 37  ALKPHOS 58  BILITOT 0.3  PROT 7.5  ALBUMIN 3.2*    Recent Labs Lab 01/06/13 1956  LIPASE 43   No results found for this basename: AMMONIA,  in the last 168 hours CBC:  Recent Labs Lab 01/06/13 1956 01/07/13 0435  WBC 10.4 15.3*  HGB 14.9 14.2  HCT 43.5 41.8  MCV 88.1 88.4  PLT 197 166   Cardiac Enzymes: No results found for this basename: CKTOTAL, CKMB, CKMBINDEX, TROPONINI,  in the last 168 hours BNP (last 3 results) No results found for this basename: PROBNP,  in the last 8760 hours CBG: No results found for this basename: GLUCAP,  in the last 168 hours  No results found for this or any previous visit (from the past 240 hour(s)).   Studies: Ct Abdomen Pelvis W Contrast  01/06/2013   *RADIOLOGY REPORT*  Clinical Data: Abdominal pain  CT ABDOMEN AND PELVIS WITH CONTRAST  Technique:  Multidetector CT imaging of the abdomen and pelvis was performed following the standard protocol during bolus administration of intravenous contrast.  Contrast: 50mL OMNIPAQUE IOHEXOL 300 MG/ML  SOLN, OMNIPAQUE IOHEXOL 300 MG/ML  SOLN  Comparison: None.  Findings:  The lung bases appear clear.  No pleural or pericardial effusion.  There are no focal liver abnormalities identified.  Mild diffuse low attenuation within the liver parenchyma suggest hepatic steatosis.  The gallbladder is normal.  No biliary dilatation. Normal appearance of the pancreas and spleen.  The adrenal glands are both normal.  Right kidney is normal.  Edema of the left kidney with perinephric fat stranding is noted.  There is marked left hydronephrosis and hydroureter.  Within the mid left ureter there is a stone measuring 6 mm, image 44/series 2.  There is mild diffuse wall thickening of the urinary bladder.  Prostate gland is normal.  There is calcified atherosclerotic disease involving the abdominal aorta.  No aneurysm.  Multiple  upper abdominal lymph nodes are identified.  No adenopathy however.  There is no pelvic or inguinal adenopathy noted.  No free fluid or fluid collections within the abdomen or pelvis. The small hiatal hernia is noted.  The small bowel loops have a normal course and caliber without obstruction.  Normal appearance of the proximal colon.  There are multiple distal colonic diverticula.  Review of the visualized osseous structures is unremarkable.  IMPRESSION:  1.  Left-sided obstructive uropathy secondary to 6 mm mid left ureteral calculus. 2.  Bladder wall thickening is nonspecific and may reflect inflammation or sequelae of chronic bladder outlet obstruction. 3.  Hepatic steatosis.   Original Report Authenticated By: Signa Kell, M.D.    Scheduled Meds: . cefTRIAXone (ROCEPHIN)  IV  1 g Intravenous Q24H   Continuous Infusions: . 0.9 % NaCl with KCl 20 mEq / L 150 mL/hr at 01/07/13 0235    Principal Problem:   Kidney stone Active Problems:   UTI (urinary tract infection)   Ureteral colic   Leukocytosis, unspecified   Hyponatremia    Time spent: 35 minutes    [facility] M  [facility] Pager [phone]. If 7PM-7AM, please contact night-coverage at [email or web address], password [facility] 01/07/2013, 8:44 AM  LOS: 1 day

## 2013-01-07 NOTE — Progress Notes (Signed)
Patient seen, independently examined and chart reviewed. I agree with exam, assessment and plan discussed with [name], NP.  47 year old man admitted with left-sided obstructive uropathy secondary to a ureteral calculus as well as apparent UTI.   Still some pain. Poor appetite. He does appear calm and comfortable. Vitals are stable.  We will continue supportive care, pain control, antiemetics as needed. Await urology recommendations and management. Continue empiric antibiotics and followup culture.  Brendia Sacks, MD [facility] [phone]

## 2013-01-08 DIAGNOSIS — D72829 Elevated white blood cell count, unspecified: Secondary | ICD-10-CM

## 2013-01-08 LAB — BASIC METABOLIC PANEL
BUN: 10 mg/dL (ref 6–23)
Creatinine, Ser: 0.95 mg/dL (ref 0.50–1.35)
GFR calc Af Amer: >90 mL/min (ref >90)
GFR calc non Af Amer: >90 mL/min (ref >90)

## 2013-01-08 LAB — CBC
MCHC: 34.4 g/dL (ref 30.0–36.0)
Platelets: 159 10*3/uL (ref 150–400)
RDW: 14.5 % (ref 11.5–15.5)

## 2013-01-08 MED ORDER — NICOTINE 21 MG/24HR TD PT24
21.0000 mg | MEDICATED_PATCH | Freq: Every day | TRANSDERMAL | Status: DC
  Administered 2013-01-08 – 2013-01-09 (×2): 21 mg via TRANSDERMAL
  Filled 2013-01-08 (×2): qty 1

## 2013-01-08 MED ORDER — DIPHENHYDRAMINE HCL 50 MG/ML IJ SOLN
25.0000 mg | Freq: Four times a day (QID) | INTRAMUSCULAR | Status: DC | PRN
  Administered 2013-01-08: 25 mg via INTRAVENOUS
  Filled 2013-01-08: qty 1

## 2013-01-08 MED ORDER — OXYCODONE-ACETAMINOPHEN 5-325 MG PO TABS
1.0000 | ORAL_TABLET | ORAL | Status: DC | PRN
  Administered 2013-01-08 – 2013-01-09 (×4): 1 via ORAL
  Filled 2013-01-08 (×4): qty 1

## 2013-01-08 NOTE — Progress Notes (Signed)
UR Chart Review Completed  

## 2013-01-08 NOTE — Progress Notes (Signed)
[facility] PROGRESS NOTE  [name] [name] WJX:[id number] DOB: [birth year] DOA: 01/06/2013 PCP: No primary provider on file.  Assessment/Plan: Kidney stone: hx of same. Pain remains controlled. Appreciate  urology consult. Will likely get stent or lithotripsy today.  Continue supportive therapy and monitor.   Ureteral colic : related to #1. Will continue pain management and supportive therapy.   UTI (urinary tract infection) : rocephin day #3. Cultures reincubated for better growth. Pt is afebrile and non-toxic appearing.   Hypertension: Pt reports hx of same. Currently ABP range 119-130. Will monitor.   Leukocytosis: likely related to above.Trending downward this am. Patient remains afebrile and non-toxic appearing. Will monitor.   Hyponatremia: mild. Stable. Monitor    Code Status: full Family Communication: none available Disposition Plan: home when ready   Consultants:  Urology   Procedures:  none  Antibiotics:  Rocephin 01/06/13>>>  HPI/Subjective: Sitting on side of bed. Reports pain controlled. Denies nausea.   Objective: Filed Vitals:   01/08/13 0632  BP: 130/87  Pulse: 85  Temp:   Resp: 20    Intake/Output Summary (Last 24 hours) at 01/08/13 0919 Last data filed at 01/08/13 2130  Gross per 24 hour  Intake 4632.5 ml  Output   2050 ml  Net 2582.5 ml   Filed Weights   01/06/13 2311 01/07/13 0549 01/08/13 0632  Weight: 77.3 kg (170 lb 6.7 oz) 77.8 kg (171 lb 8.3 oz) 79.6 kg (175 lb 7.8 oz)    Exam:   General:  Well nourished NAD  Cardiovascular: RRR No MGR No LE edema  Respiratory: normal effort BS clear to ausculation bilaterally. No wheeze  Abdomen: soft non-distended. +BS non-tender to palpation  Musculoskeletal: no clubbing no cyanosis   Data Reviewed: Basic Metabolic Panel:  Recent Labs Lab 01/06/13 1956 01/06/13 2354 01/07/13 0435 01/08/13 0550  NA 132*  --  134* 134*  K 3.5  --  3.9 4.0  CL 96  --  101 103  CO2 24  --  21  23  GLUCOSE 92  --  97 95  BUN 12  --  12 10  CREATININE 1.09  --  1.21 0.95  CALCIUM 8.8  --  8.5 8.3*  MG  --  1.6  --   --    Liver Function Tests:  Recent Labs Lab 01/06/13 1956  AST 54*  ALT 37  ALKPHOS 58  BILITOT 0.3  PROT 7.5  ALBUMIN 3.2*    Recent Labs Lab 01/06/13 1956  LIPASE 43   No results found for this basename: AMMONIA,  in the last 168 hours CBC:  Recent Labs Lab 01/06/13 1956 01/07/13 0435 01/08/13 0550  WBC 10.4 15.3* 14.0*  HGB 14.9 14.2 13.5  HCT 43.5 41.8 39.3  MCV 88.1 88.4 88.3  PLT 197 166 159   Cardiac Enzymes: No results found for this basename: CKTOTAL, CKMB, CKMBINDEX, TROPONINI,  in the last 168 hours BNP (last 3 results) No results found for this basename: PROBNP,  in the last 8760 hours CBG: No results found for this basename: GLUCAP,  in the last 168 hours  Recent Results (from the past 240 hour(s))  URINE CULTURE     Status: None   Collection Time    01/06/13  8:40 PM      Result Value Range Status   Specimen Description URINE, CLEAN CATCH   Final   Special Requests NONE   Final   Culture  Setup Time     Final  Value: 01/06/2013 22:00     Performed at [facility] Count PENDING   Incomplete   Culture     Final   Value: Culture reincubated for better growth     Performed at [facility]   Report Status PENDING   Incomplete     Studies: Dg Abd 1 View  01/07/2013   *RADIOLOGY REPORT*  Clinical Data: History of urinary calculus in the left ureter.  ABDOMEN - 1 VIEW  Comparison: CT 01/06/2013.  Findings: On the previous CT examination a calculus was present in the left ureter to the left of the level of the L4 vertebra.  On the current study I cannot definitely visualize this calculus in the path of the ureter.  There are numerous calcifications in the pelvis which are felt to be probably phleboliths.  I cannot exclude one of these could reflect a calculus.  There is residual contrast within the  colon.  No skeletal lesions are seen.  IMPRESSION: On the previous CT examination a calculus was present in the left ureter to the left of the level of the L4 vertebra.  On the current study I cannot definitely visualize this calculus in the path of the ureter.  There are numerous calcifications in the pelvis which are felt to be probably phleboliths.  I cannot exclude one of these could reflect a calculus.   Original Report Authenticated By: Onalee Hua Call   Ct Abdomen Pelvis W Contrast  01/06/2013   *RADIOLOGY REPORT*  Clinical Data: Abdominal pain  CT ABDOMEN AND PELVIS WITH CONTRAST  Technique:  Multidetector CT imaging of the abdomen and pelvis was performed following the standard protocol during bolus administration of intravenous contrast.  Contrast: 50mL OMNIPAQUE IOHEXOL 300 MG/ML  SOLN, OMNIPAQUE IOHEXOL 300 MG/ML  SOLN  Comparison: None.  Findings:  The lung bases appear clear.  No pleural or pericardial effusion.  There are no focal liver abnormalities identified.  Mild diffuse low attenuation within the liver parenchyma suggest hepatic steatosis.  The gallbladder is normal.  No biliary dilatation. Normal appearance of the pancreas and spleen.  The adrenal glands are both normal.  Right kidney is normal.  Edema of the left kidney with perinephric fat stranding is noted.  There is marked left hydronephrosis and hydroureter.  Within the mid left ureter there is a stone measuring 6 mm, image 44/series 2.  There is mild diffuse wall thickening of the urinary bladder.  Prostate gland is normal.  There is calcified atherosclerotic disease involving the abdominal aorta.  No aneurysm.  Multiple upper abdominal lymph nodes are identified.  No adenopathy however.  There is no pelvic or inguinal adenopathy noted.  No free fluid or fluid collections within the abdomen or pelvis. The small hiatal hernia is noted.  The small bowel loops have a normal course and caliber without obstruction.  Normal appearance of  the proximal colon.  There are multiple distal colonic diverticula.  Review of the visualized osseous structures is unremarkable.  IMPRESSION:  1.  Left-sided obstructive uropathy secondary to 6 mm mid left ureteral calculus. 2.  Bladder wall thickening is nonspecific and may reflect inflammation or sequelae of chronic bladder outlet obstruction. 3.  Hepatic steatosis.   Original Report Authenticated By: Signa Kell, M.D.    Scheduled Meds: . cefTRIAXone (ROCEPHIN)  IV  1 g Intravenous Q24H   Continuous Infusions: . 0.9 % NaCl with KCl 20 mEq / L 150 mL/hr at 01/07/13 2318  Principal Problem:   Kidney stone Active Problems:   UTI (urinary tract infection)   Ureteral colic   Leukocytosis, unspecified   Hyponatremia    Time spent: 30 minutes [facility] M  [facility] Pager [phone]. If 7PM-7AM, please contact night-coverage at [email or web address], password [facility] 01/08/2013, 9:19 AM  LOS: 2 days

## 2013-01-08 NOTE — Plan of Care (Signed)
Problem: Phase I Progression Outcomes Goal: Pain controlled with appropriate interventions Talked with pt about pain med options

## 2013-01-08 NOTE — Progress Notes (Signed)
Note [phone]

## 2013-01-08 NOTE — Consult Note (Signed)
[name], [name] NO.:  [id number]  MEDICAL RECORD NO.:  [id number]  LOCATION:  A318                          FACILITY:  [facility]  PHYSICIAN:  Ky Barban, M.D.DATE OF BIRTH:  Dec 15, 1965  DATE OF CONSULTATION: DATE OF DISCHARGE:                                CONSULTATION   CHIEF COMPLAINT:  Recurrent left renal colic.  HISTORY:  This patient, who is 47 years old came to the emergency room yesterday with severe left flank pain with nausea and vomiting.  CT scan was done.  It showed there is a 6 mm stone in the left upper ureter causing obstruction.  He had hydronephrosis on that side, perinephric stranding.  He is admitted for further management of control of pain. The pain is not as bad today he says, but still comes and goes.  No history of gross hematuria, fever, or chills.  His temperature is 98.1, blood pressure 108/70, O2 saturation on room air is 99%, pulse 80 per minute, respirations 20 per minute.  PAST MEDICAL HISTORY:  No history of kidney stones.  History of appendectomy 7-8 years ago.  Also has hypertension, takes medicine, no diabetes.  FAMILY HISTORY:  No history of kidney stones.  PERSONAL HISTORY:  He does not drink or smoke.  PHYSICAL EXAMINATION:  VITAL SIGNS:  Blood pressure is 108/70, temperature 98.1. GENERAL:  Fully conscious, alert, oriented, not in acute distress. ABDOMEN:  Soft, flat.  Liver, spleen, kidneys are not palpable. BACK:  1+ left CVA tenderness. GU:  External genitalia is unremarkable. RECTAL:  Deferred.  LABORATORY DATA:  His WBC count is elevated to 15.3.  He has been placed on Rocephin IV.  His hematocrit is 43.5.  Yesterday's white count was 10.4, today it has gone up to 15.3, hematocrit from 43.5 went down to 41.8.  His sodium is 138, potassium 3.9, chloride of 101, CO2 is 21.  CT of the abdomen and pelvis without contrast as I mentioned above.  IMPRESSION:  Left upper ureteral calculus.  Recommend KUB.   I want to see if the stone is radiolucent or opaque.  If it is radiopaque, then we will consider doing lithotripsy. If it is radiolucent, we will consider use of double-J stent.  I will decide about the stent after how he does rest of the day.     Ky Barban, M.D.     MIJ/MEDQ  D:  01/07/2013  T:  01/07/2013  Job:  [id number]

## 2013-01-08 NOTE — Progress Notes (Signed)
NAMEDYMOND, SPREEN NO.:  [id number]  MEDICAL RECORD NO.:  [id number]  LOCATION:  A318                          FACILITY:  [facility]  PHYSICIAN:  Ky Barban, M.D.DATE OF BIRTH:  1965-07-07  DATE OF PROCEDURE: DATE OF DISCHARGE:                                PROGRESS NOTE   He is feeling better and his WBC count is down to 14,000.  He is still on IV Rocephin.  IMPRESSION:  Right upper ureteral calculus, possible urinary tract infection.  I think if he has no pain by tomorrow, he can be discharged home, then I can see him in the office on Monday to schedule lithotripsy as an outpatient.  He does not need double-J stent because he is not hurting as bad and also his white count is coming down.  He does not seem to have any acute urinary tract infection.  So, he can be discharged home on oral pain medicine and antibiotics, and I will see him in the office on Monday.     Ky Barban, M.D.     MIJ/MEDQ  D:  01/08/2013  T:  01/08/2013  Job:  [id number]

## 2013-01-08 NOTE — Progress Notes (Signed)
Patient seen and examined.  Agree with note as per [name], NP  Await further recommendations from urology. Labs/vitals stable.  Physical exam benign.  [name],[name]

## 2013-01-09 DIAGNOSIS — R109 Unspecified abdominal pain: Secondary | ICD-10-CM

## 2013-01-09 LAB — URINE CULTURE

## 2013-01-09 LAB — BASIC METABOLIC PANEL
CO2: 23 mEq/L (ref 19–32)
Calcium: 8.2 mg/dL — ABNORMAL LOW (ref 8.4–10.5)
Chloride: 101 mEq/L (ref 96–112)
Sodium: 133 mEq/L — ABNORMAL LOW (ref 135–145)

## 2013-01-09 LAB — CBC
Platelets: 144 10*3/uL — ABNORMAL LOW (ref 150–400)
RBC: 4.71 MIL/uL (ref 4.22–5.81)
WBC: 11.5 10*3/uL — ABNORMAL HIGH (ref 4.0–10.5)

## 2013-01-09 MED ORDER — CIPROFLOXACIN HCL 100 MG PO TABS: 100.0000 mg | ORAL_TABLET | Freq: Two times a day (BID) | ORAL | Status: DC

## 2013-01-09 MED ORDER — OXYCODONE-ACETAMINOPHEN 5-325 MG PO TABS: 1.0000 | ORAL_TABLET | ORAL | Status: DC | PRN

## 2013-01-09 MED ORDER — CIPROFLOXACIN HCL 500 MG PO TABS: 500.0000 mg | ORAL_TABLET | Freq: Two times a day (BID) | ORAL | Status: AC

## 2013-01-09 MED ORDER — NICOTINE 21 MG/24HR TD PT24: 1.0000 | MEDICATED_PATCH | Freq: Every day | TRANSDERMAL | Status: DC

## 2013-01-09 NOTE — Care Management Note (Signed)
    Page 1 of 1   01/09/2013     2:38:28 PM   CARE MANAGEMENT NOTE 01/09/2013  Patient:  [name] [name], [name] [name]   Account Number:  [id number]  Date Initiated:  01/09/2013  Documentation initiated by:  Anibal Henderson  Subjective/Objective Assessment:   Admitted with abd pain. Pt is from home, is independent and will return home. He is a patient of [facility] and has an appt on the 2nd and will follow up there     Action/Plan:   No needs identified   Anticipated DC Date:  01/09/2013   Anticipated DC Plan:  HOME/SELF CARE      DC Planning Services  CM consult      Choice offered to / List presented to:             Status of service:  Completed, signed off Medicare Important Message given?   (If response is "NO", the following Medicare IM given date fields will be blank) Date Medicare IM given:   Date Additional Medicare IM given:    Discharge Disposition:    Per UR Regulation:  Reviewed for med. necessity/level of care/duration of stay  If discussed at Long Length of Stay Meetings, dates discussed:    Comments:  01/09/13  1100 Anibal Henderson RN/CM

## 2013-01-09 NOTE — Discharge Summary (Signed)
Patient seen and examined.  Agree with note as above per [name], NP.  Patient's pain is improved. Leukocytosis is improved. He is on antibiotics for urinary tract infection. Plans are to followup with urology for further management of his kidney stone.  MEMON,JEHANZEB

## 2013-01-09 NOTE — Progress Notes (Signed)
Pt only requested pain medication twice last night and pain was decreased with both administrations.  Pt stated he felt better by staying one more night.

## 2013-01-09 NOTE — Discharge Summary (Signed)
Physician Discharge Summary  [name] [name]:[id number] DOB: June 27, 1965 DOA: 01/06/2013  PCP: No primary provider on file.  Admit date: 01/06/2013 Discharge date: 01/09/2013  Time spent: 40 minutes  Recommendations for Outpatient Follow-up:  Pt has appointment with Dr. Jerre Simon 01/13/13 at 3pm for possible lithotripsy  Discharge Diagnoses:  Principal Problem:   Kidney stone Active Problems:   UTI (urinary tract infection)   Ureteral colic   Leukocytosis, unspecified   Hyponatremia   Discharge Condition: stable. Pain controlled with oral pain medicine. Tolerating diet well.   Diet recommendation: regular  Filed Weights   01/07/13 0549 01/08/13 9562 01/09/13 0500  Weight: 77.8 kg (171 lb 8.3 oz) 79.6 kg (175 lb 7.8 oz) 80 kg (176 lb 5.9 oz)    History of present illness:  [name] [name] is a 47 y.o. male. Middle-aged [place] American gentleman who presented on 01/06/13 with a history of low-grade intermittent colicky abdominal pain since the morning. Was able to go shopping with a relative but then in the afternoon developed severe recurrence of intense left flank colicky pain associated with urinary frequency and dysuria and he call the relatives to bring him to hospital. In the ED CT scan left ureteral obstruction by stone.  Patient has a history of hyperlipidemia and hypertension but did not know the name of his medications. Patient was in severe pain at the time of admission   Hospital Course:  Kidney stone: hx of same. Patient admitted to medical floor. Provided with IV pain medicine. Evaluated by Dr. Jerre Simon from urology who initially thought pt would need stent or lithotripsy. Patients pain quickly improved and plan changed to OP lithotripsy on 01/13/13. Pt has appointment 01/13/13 at 3pm. He will be discharged with oral pain medicine. At time of discharge his pain well controlled with po pain medicine.    Ureteral colic : related to #1. Resolved at discharge.   UTI (urinary tract  infection) : rocephin day #3. Cultures reincubated for better growth. Will follow. Received Rocephin for 2 days. Will be discharged with 8 more days of cipro. Pt remained  afebrile and non-toxic appearing during this hospitalization.    Hypertension: Pt reports hx of same. Somewhat elevated initially likely related to pain. At discharge controlled. Pt will need to follow up with PCP for close OP monitoring as he is currently not on anti-hypertensive.    Leukocytosis: likely related to above. Continues to trend downward. At discharge white count 11.5 down from 15.3 on admission. Will be discharged with 8 days cipro to complete 10 day antibiotic course. Patient remained afebrile and non-toxic appearing appearing during this hospitalization.   Hyponatremia: mild. Stable.     Procedures: none Consultations:  Dr. Jerre Simon Urology  Discharge Exam: Filed Vitals:   01/09/13 0909  BP: 127/85  Pulse: 79  Temp:   Resp:     General: sitting up in bed eating breakfast NAD Cardiovascular: RRR No MGR No LE edeam Respiratory: normal effort BS clear bilaterally no wheeze no crackles Abdomen: soft + BS x4. Non-tender to palpation. Non-distended  Discharge Instructions     Medication List         ciprofloxacin 100 MG tablet  Commonly known as:  CIPRO  Take 1 tablet (100 mg total) by mouth 2 (two) times daily.     nicotine 21 mg/24hr patch  Commonly known as:  NICODERM CQ - dosed in mg/24 hours  Place 1 patch onto the skin daily.     oxyCODONE-acetaminophen 5-325 MG per  tablet  Commonly known as:  PERCOCET/ROXICET  Take 1 tablet by mouth every 4 (four) hours as needed.     quinapril 10 MG tablet  Commonly known as:  ACCUPRIL  Take 10 mg by mouth at bedtime.     rosuvastatin 20 MG tablet  Commonly known as:  CRESTOR  Take 20 mg by mouth daily.       No Known Allergies     Follow-up Information   Follow up with Ky Barban, MD On 01/13/2013. (has appointment 3:30pm)     Specialty:  Urology   Contact information:   [address] [place] [address] [phone]        The results of significant diagnostics from this hospitalization (including imaging, microbiology, ancillary and laboratory) are listed below for reference.    Significant Diagnostic Studies: Dg Abd 1 View  01/07/2013   *RADIOLOGY REPORT*  Clinical Data: History of urinary calculus in the left ureter.  ABDOMEN - 1 VIEW  Comparison: CT 01/06/2013.  Findings: On the previous CT examination a calculus was present in the left ureter to the left of the level of the L4 vertebra.  On the current study I cannot definitely visualize this calculus in the path of the ureter.  There are numerous calcifications in the pelvis which are felt to be probably phleboliths.  I cannot exclude one of these could reflect a calculus.  There is residual contrast within the colon.  No skeletal lesions are seen.  IMPRESSION: On the previous CT examination a calculus was present in the left ureter to the left of the level of the L4 vertebra.  On the current study I cannot definitely visualize this calculus in the path of the ureter.  There are numerous calcifications in the pelvis which are felt to be probably phleboliths.  I cannot exclude one of these could reflect a calculus.   Original Report Authenticated By: Onalee Hua Call   Ct Abdomen Pelvis W Contrast  01/06/2013   *RADIOLOGY REPORT*  Clinical Data: Abdominal pain  CT ABDOMEN AND PELVIS WITH CONTRAST  Technique:  Multidetector CT imaging of the abdomen and pelvis was performed following the standard protocol during bolus administration of intravenous contrast.  Contrast: 50mL OMNIPAQUE IOHEXOL 300 MG/ML  SOLN, OMNIPAQUE IOHEXOL 300 MG/ML  SOLN  Comparison: None.  Findings:  The lung bases appear clear.  No pleural or pericardial effusion.  There are no focal liver abnormalities identified.  Mild diffuse low attenuation within the liver parenchyma suggest hepatic  steatosis.  The gallbladder is normal.  No biliary dilatation. Normal appearance of the pancreas and spleen.  The adrenal glands are both normal.  Right kidney is normal.  Edema of the left kidney with perinephric fat stranding is noted.  There is marked left hydronephrosis and hydroureter.  Within the mid left ureter there is a stone measuring 6 mm, image 44/series 2.  There is mild diffuse wall thickening of the urinary bladder.  Prostate gland is normal.  There is calcified atherosclerotic disease involving the abdominal aorta.  No aneurysm.  Multiple upper abdominal lymph nodes are identified.  No adenopathy however.  There is no pelvic or inguinal adenopathy noted.  No free fluid or fluid collections within the abdomen or pelvis. The small hiatal hernia is noted.  The small bowel loops have a normal course and caliber without obstruction.  Normal appearance of the proximal colon.  There are multiple distal colonic diverticula.  Review of the visualized osseous structures is unremarkable.  IMPRESSION:  1.  Left-sided obstructive uropathy secondary to 6 mm mid left ureteral calculus. 2.  Bladder wall thickening is nonspecific and may reflect inflammation or sequelae of chronic bladder outlet obstruction. 3.  Hepatic steatosis.   Original Report Authenticated By: Signa Kell, M.D.    Microbiology: Recent Results (from the past 240 hour(s))  URINE CULTURE     Status: None   Collection Time    01/06/13  8:40 PM      Result Value Range Status   Specimen Description URINE, CLEAN CATCH   Final   Special Requests NONE   Final   Culture  Setup Time     Final   Value: 01/06/2013 22:00     Performed at [facility] Count     Final   Value: >=100,000 COLONIES/ML     Performed at [facility]   Culture     Final   Value: Culture reincubated for better growth     Performed at [facility]   Report Status PENDING   Incomplete     Labs: Basic Metabolic Panel:  Recent  Labs Lab 01/06/13 1956 01/06/13 2354 01/07/13 0435 01/08/13 0550 01/09/13 0536  NA 132*  --  134* 134* 133*  K 3.5  --  3.9 4.0 3.6  CL 96  --  101 103 101  CO2 24  --  21 23 23   GLUCOSE 92  --  97 95 100*  BUN 12  --  12 10 5*  CREATININE 1.09  --  1.21 0.95 0.96  CALCIUM 8.8  --  8.5 8.3* 8.2*  MG  --  1.6  --   --   --    Liver Function Tests:  Recent Labs Lab 01/06/13 1956  AST 54*  ALT 37  ALKPHOS 58  BILITOT 0.3  PROT 7.5  ALBUMIN 3.2*    Recent Labs Lab 01/06/13 1956  LIPASE 43   No results found for this basename: AMMONIA,  in the last 168 hours CBC:  Recent Labs Lab 01/06/13 1956 01/07/13 0435 01/08/13 0550 01/09/13 0536  WBC 10.4 15.3* 14.0* 11.5*  HGB 14.9 14.2 13.5 14.2  HCT 43.5 41.8 39.3 41.1  MCV 88.1 88.4 88.3 87.3  PLT 197 166 159 144*   Cardiac Enzymes: No results found for this basename: CKTOTAL, CKMB, CKMBINDEX, TROPONINI,  in the last 168 hours BNP: BNP (last 3 results) No results found for this basename: PROBNP,  in the last 8760 hours CBG: No results found for this basename: GLUCAP,  in the last 168 hours     Signed:  Gwenyth Bender  [facility] 01/09/2013, 9:14 AM

## 2013-01-09 NOTE — Progress Notes (Signed)
Patient states understanding of discharge instructions, prescriptions given 

## 2013-01-13 ENCOUNTER — Ambulatory Visit (HOSPITAL_COMMUNITY)
Admission: RE | Admit: 2013-01-13 | Discharge: 2013-01-13 | Disposition: A | Discharge status: Home / Self Care | Payer: Self-pay | Source: Ambulatory Visit | Attending: Urology | Admitting: Urology

## 2013-01-13 ENCOUNTER — Other Ambulatory Visit (HOSPITAL_COMMUNITY): Payer: Self-pay | Admitting: Urology

## 2013-01-13 DIAGNOSIS — N200 Calculus of kidney: Secondary | ICD-10-CM

## 2013-01-13 DIAGNOSIS — R109 Unspecified abdominal pain: Secondary | ICD-10-CM | POA: Insufficient documentation

## 2013-01-13 DIAGNOSIS — N201 Calculus of ureter: Secondary | ICD-10-CM

## 2013-01-14 ENCOUNTER — Ambulatory Visit (HOSPITAL_COMMUNITY)
Admission: RE | Admit: 2013-01-14 | Discharge: 2013-01-14 | Disposition: A | Discharge status: Home / Self Care | Payer: Self-pay | Source: Ambulatory Visit | Attending: Urology | Admitting: Urology

## 2013-01-14 DIAGNOSIS — R1032 Left lower quadrant pain: Secondary | ICD-10-CM | POA: Insufficient documentation

## 2013-01-14 DIAGNOSIS — N201 Calculus of ureter: Secondary | ICD-10-CM | POA: Insufficient documentation

## 2013-01-14 DIAGNOSIS — N1330 Unspecified hydronephrosis: Secondary | ICD-10-CM | POA: Insufficient documentation

## 2013-01-14 IMAGING — CT CT ABD-PELV W/O CM
2 of 4 series · 16 of 46 positions shown, 18 images · non-contrast
Comparison: CT scan of [DATE].

CLINICAL DATA: Left flank pain, ureteral calculi.

CT ABDOMEN AND PELVIS WITHOUT CONTRAST
TECHNIQUE: Multidetector CT imaging of the abdomen and pelvis was
performed following the standard protocol without intravenous
contrast.

[Series 2: standard/full over (age)lbs 5.0 · axial · 0.65mm/px · z∈[+542,+942]mm · 13 of 88 slices shown, 15 images]
[im 4/88  soft-tissue]
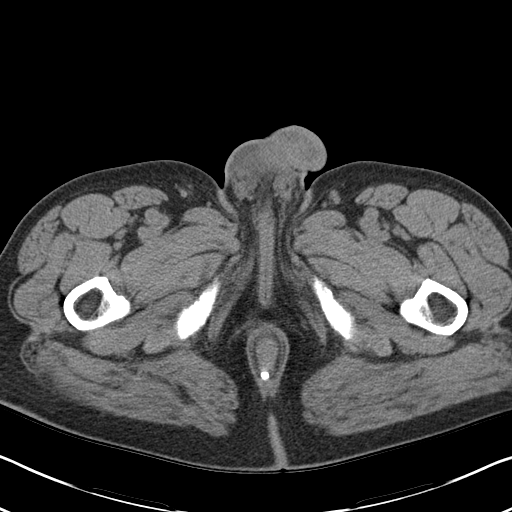
[im 4/88  bone]
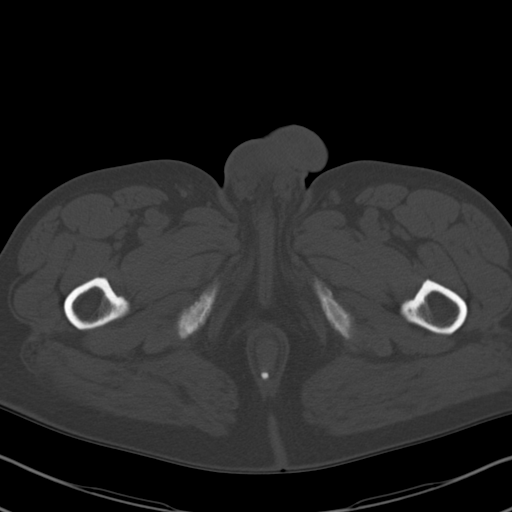
[im 11/88  soft-tissue]
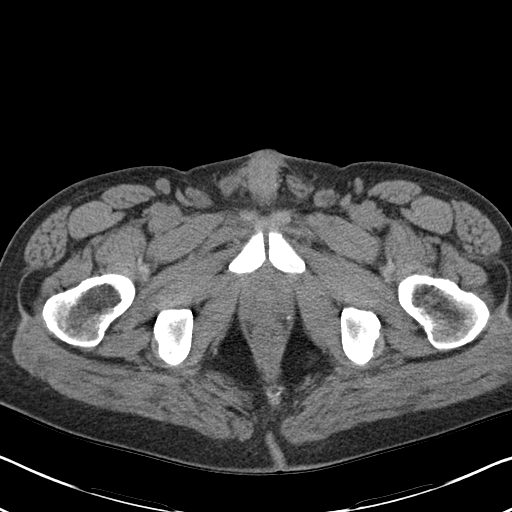
[im 17/88  soft-tissue]
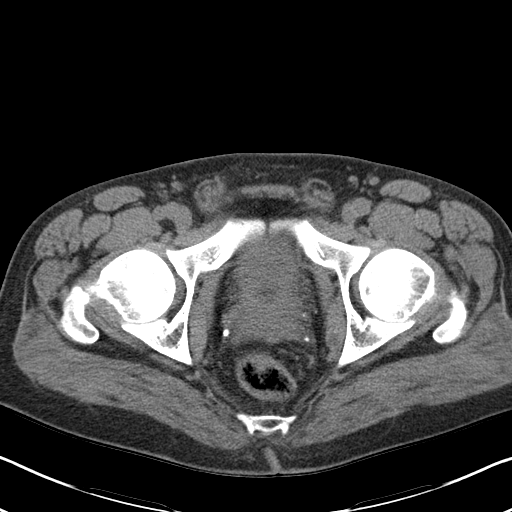
[im 24/88  soft-tissue]
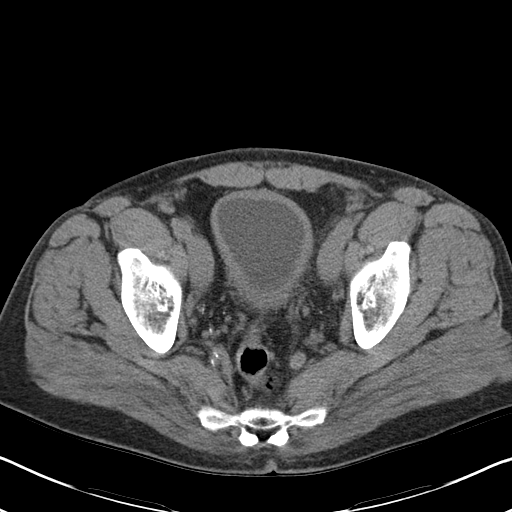
[im 31/88  soft-tissue]
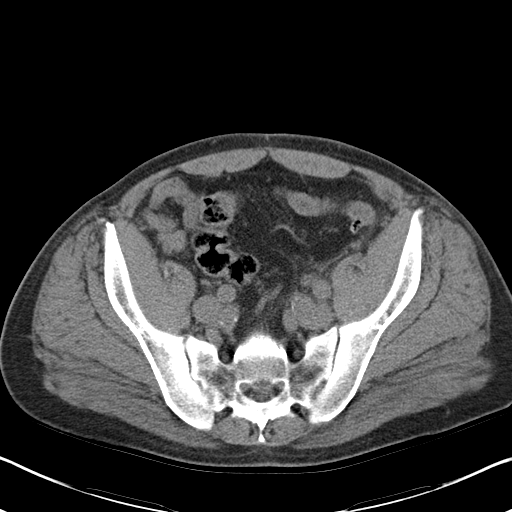
[im 37/88  soft-tissue]
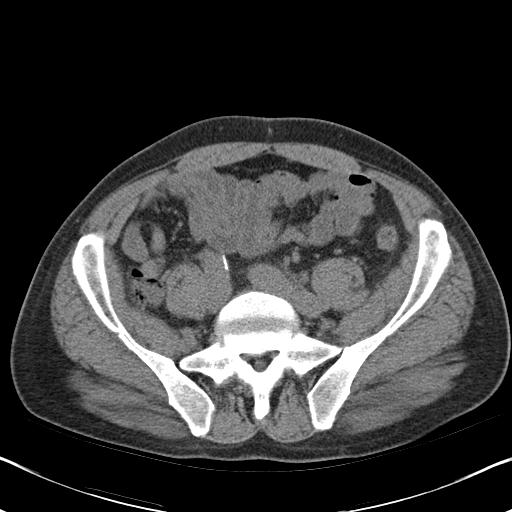
[im 44/88  soft-tissue]
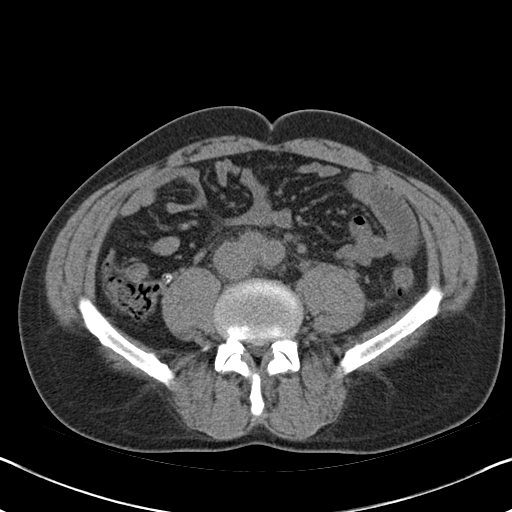
[im 51/88  soft-tissue]
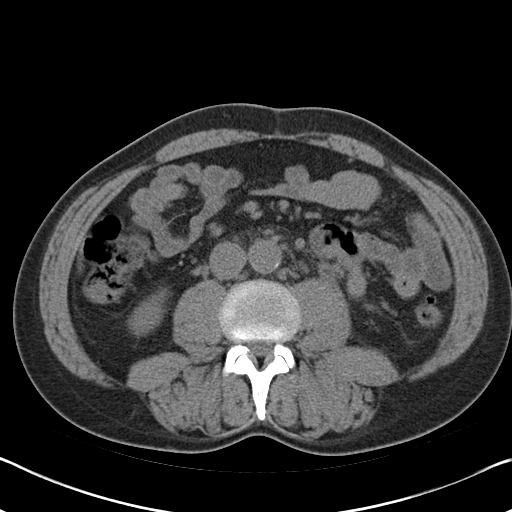
[im 57/88  soft-tissue]
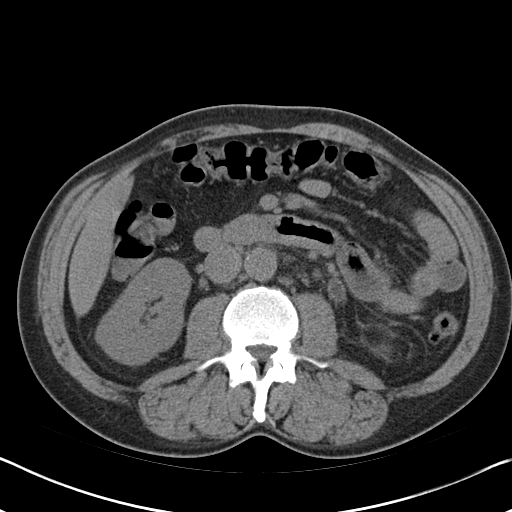
[im 57/88  bone]
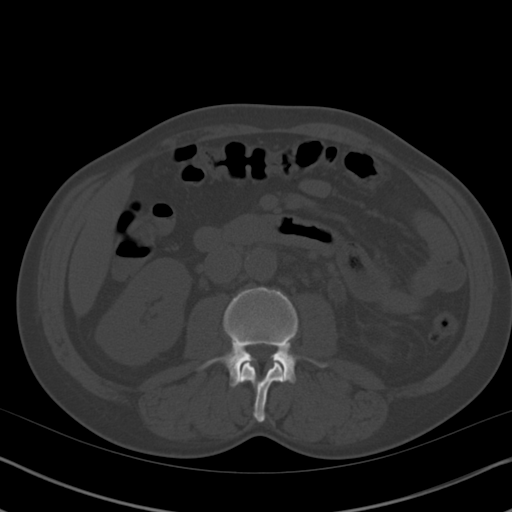
[im 64/88  soft-tissue]
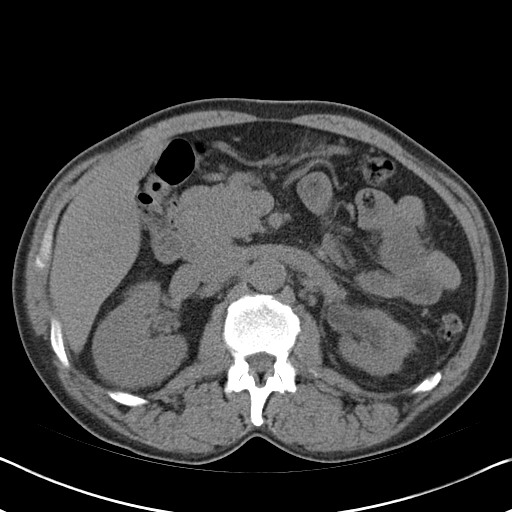
[im 71/88  soft-tissue]
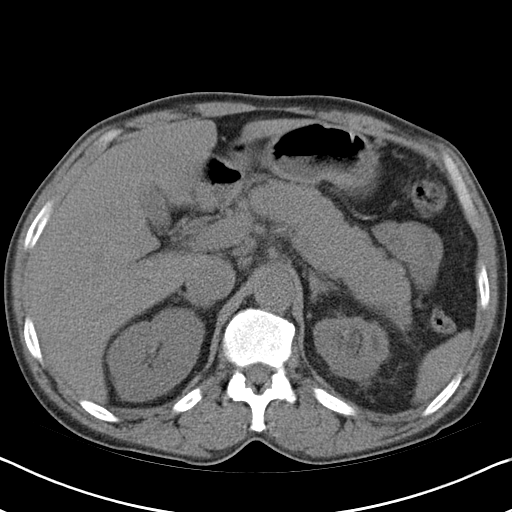
[im 77/88  soft-tissue]
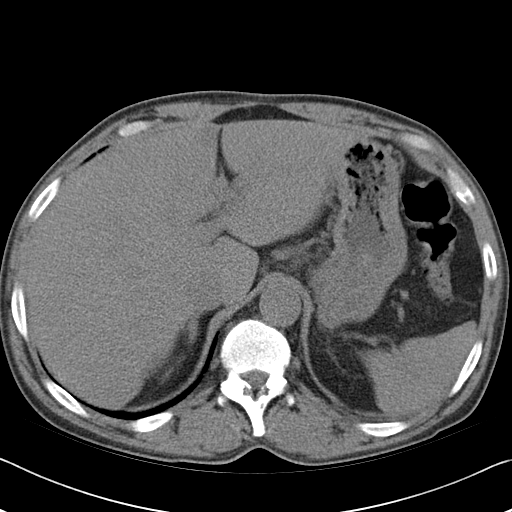
[im 84/88  soft-tissue]
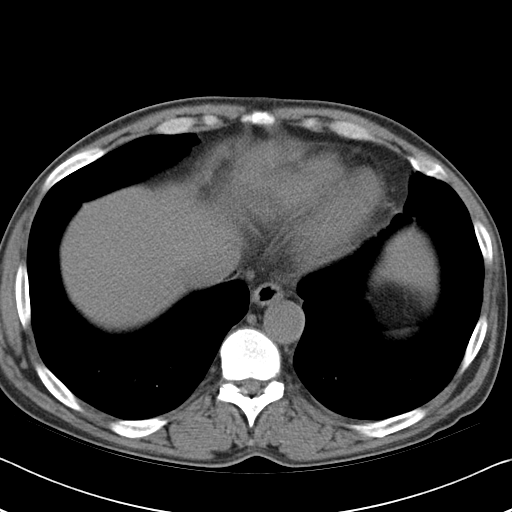

[Series 4: mpr coronal · coronal · 0.65mm/px · 3 of 77 slices shown]
[im 26/77  soft-tissue]
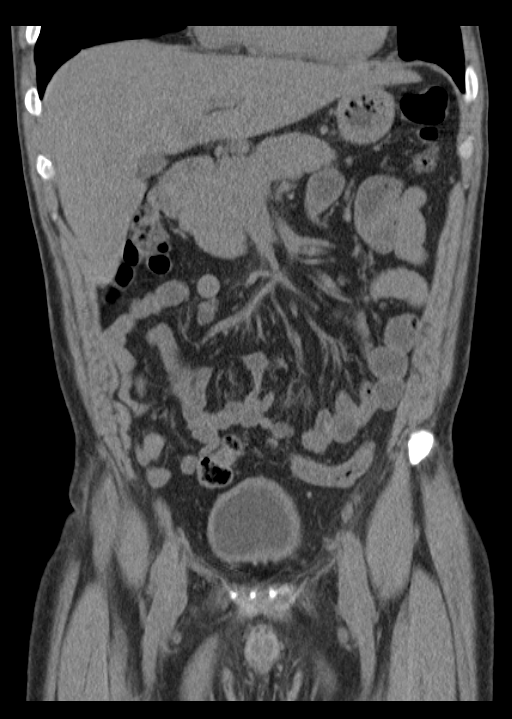
[im 34/77  soft-tissue]
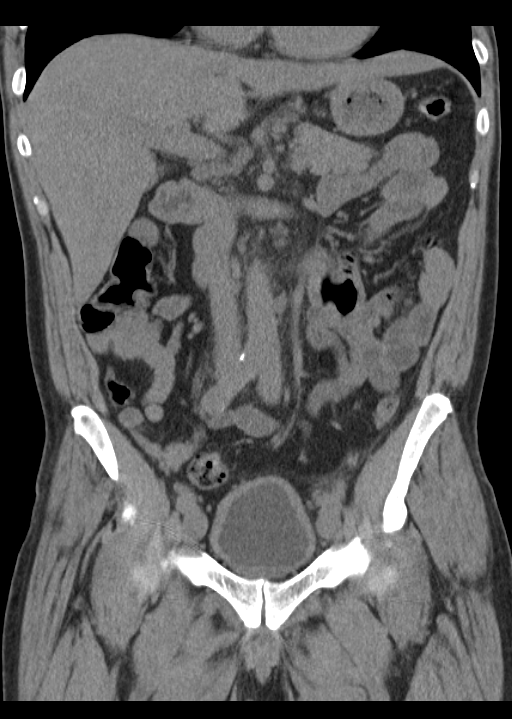
[im 43/77  soft-tissue]
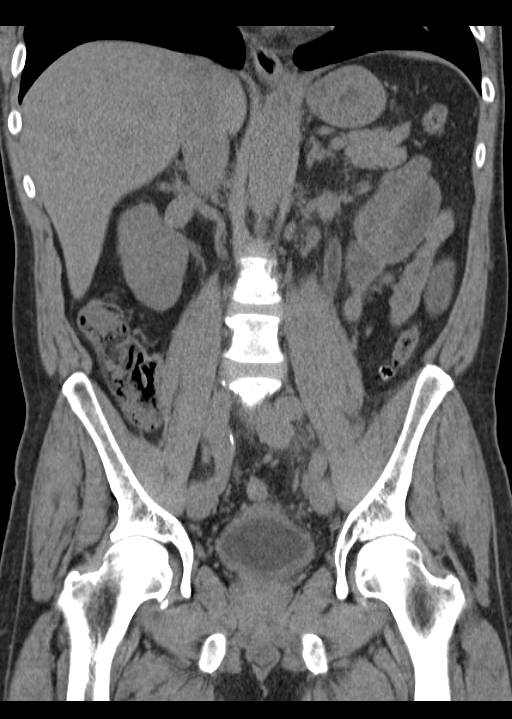

[16 of 46 positions shown; findings below may reference images not displayed]

FINDINGS: Visualized lung bases appear normal.  No focal
abnormalities seen in the liver, spleen or pancreas on these
unenhanced images.  No gallstones are noted.  Adrenal glands appear
grossly normal.  The right kidney and ureter appear normal.
Extensive cortical scarring is seen involving the left kidney
resulting in some degree of atrophy.  Mild left hydronephrosis and
proximal left ureteral dilatation is noted which is improved
compared to prior exam.  This appears to be due to 4 mm linear
calculus seen in the midportion of left ureter which is
significantly smaller compared to stones seen on prior CT scan.  No
evidence of obstruction is noted.  Mild wall thickening of urinary
bladder is noted which may represent chronic cystitis or lack of
distension.  No abnormal fluid collection is noted.
IMPRESSION: There is continued presence of mild left hydroureteronephrosis
which is improved compared to prior exam.  The large left ureteral
calculus noted on prior study is significantly smaller in size, now
only measuring 4 mm.

## 2013-11-02 ENCOUNTER — Encounter (HOSPITAL_COMMUNITY): Payer: Self-pay | Admitting: Emergency Medicine

## 2013-11-02 ENCOUNTER — Emergency Department (HOSPITAL_COMMUNITY)
Admission: EM | Admit: 2013-11-02 | Discharge: 2013-11-02 | Disposition: A | Discharge status: Home / Self Care | Payer: Self-pay | Attending: Emergency Medicine | Admitting: Emergency Medicine

## 2013-11-02 DIAGNOSIS — Z87442 Personal history of urinary calculi: Secondary | ICD-10-CM | POA: Insufficient documentation

## 2013-11-02 DIAGNOSIS — N23 Unspecified renal colic: Secondary | ICD-10-CM | POA: Insufficient documentation

## 2013-11-02 DIAGNOSIS — F17200 Nicotine dependence, unspecified, uncomplicated: Secondary | ICD-10-CM | POA: Insufficient documentation

## 2013-11-02 DIAGNOSIS — I10 Essential (primary) hypertension: Secondary | ICD-10-CM | POA: Insufficient documentation

## 2013-11-02 DIAGNOSIS — E7800 Pure hypercholesterolemia, unspecified: Secondary | ICD-10-CM | POA: Insufficient documentation

## 2013-11-02 DIAGNOSIS — Z79899 Other long term (current) drug therapy: Secondary | ICD-10-CM | POA: Insufficient documentation

## 2013-11-02 HISTORY — DX: Calculus of kidney: N20.0

## 2013-11-02 LAB — URINALYSIS, ROUTINE W REFLEX MICROSCOPIC
BILIRUBIN URINE: NEGATIVE
Glucose, UA: NEGATIVE mg/dL
HGB URINE DIPSTICK: NEGATIVE
KETONES UR: NEGATIVE mg/dL
Leukocytes, UA: NEGATIVE
NITRITE: POSITIVE — AB
Protein, ur: 100 mg/dL — AB
SPECIFIC GRAVITY, URINE: 1.025 (ref 1.005–1.030)
UROBILINOGEN UA: 0.2 mg/dL (ref 0.0–1.0)
pH: 6.5 (ref 5.0–8.0)

## 2013-11-02 LAB — URINE MICROSCOPIC-ADD ON

## 2013-11-02 MED ORDER — METOCLOPRAMIDE HCL 10 MG PO TABS: 10.0000 mg | ORAL_TABLET | Freq: Four times a day (QID) | ORAL | Status: DC | PRN

## 2013-11-02 MED ORDER — ONDANSETRON HCL 4 MG/2ML IJ SOLN
4.0000 mg | Freq: Once | INTRAMUSCULAR | Status: AC
  Administered 2013-11-02: 4 mg via INTRAVENOUS
  Filled 2013-11-02: qty 2

## 2013-11-02 MED ORDER — ONDANSETRON 8 MG PO TBDP: ORAL_TABLET | ORAL | Status: DC

## 2013-11-02 MED ORDER — KETOROLAC TROMETHAMINE 30 MG/ML IJ SOLN
30.0000 mg | Freq: Once | INTRAMUSCULAR | Status: AC
  Administered 2013-11-02: 30 mg via INTRAVENOUS
  Filled 2013-11-02: qty 1

## 2013-11-02 MED ORDER — OXYCODONE-ACETAMINOPHEN 5-325 MG PO TABS: 2.0000 | ORAL_TABLET | ORAL | Status: DC | PRN

## 2013-11-02 MED ORDER — CEPHALEXIN 500 MG PO CAPS: ORAL_CAPSULE | ORAL | Status: DC

## 2013-11-02 MED ORDER — MORPHINE SULFATE 4 MG/ML IJ SOLN
8.0000 mg | Freq: Once | INTRAMUSCULAR | Status: AC
  Administered 2013-11-02: 8 mg via INTRAVENOUS
  Filled 2013-11-02: qty 2

## 2013-11-02 NOTE — ED Notes (Signed)
Pt c/o left side abd pain with nausea that started today,

## 2013-11-02 NOTE — ED Provider Notes (Signed)
CSN: [id number]     Arrival date & time 11/02/13  4656 History  This chart was scribed for [name] Relic, MD by Girtha Hake, ED Scribe. The patient was seen in [facility]. The patient's care was started at 8:02 AM.     Chief Complaint  Patient presents with  . Abdominal Pain    The history is provided by the patient. No language interpreter was used.   HPI Comments: EAN GETTEL is a 48 y.o. male with a history of kidney stones and appendectomy who presents to the Emergency Department complaining of sudden-onset, severe, left-sided abdominal pain beginning three hours ago. Patient reports associated nausea and increased urinary frequency this morning. He denies fever, vomiting, diarrhea, SOB, or testicular pain.   Patient does not have a PCP.  Past Medical History  Diagnosis Date  . Hypertension   . High cholesterol   . Kidney stones    Past Surgical History  Procedure Laterality Date  . Fracture surgery    . Appendectomy     History reviewed. No pertinent family history. History  Substance Use Topics  . Smoking status: Current Every Day Smoker    Types: Cigarettes  . Smokeless tobacco: Current User  . Alcohol Use: Yes     Comment: occasional    Review of Systems 10 Systems reviewed and are negative for acute change except as noted in the HPI.    Allergies  Review of patient's allergies indicates no known allergies.  Home Medications   Prior to Admission medications   Medication Sig Start Date End Date Taking? Authorizing Provider  quinapril (ACCUPRIL) 10 MG tablet Take 10 mg by mouth at bedtime.   Yes Historical Provider, MD  rosuvastatin (CRESTOR) 20 MG tablet Take 20 mg by mouth daily.   Yes Historical Provider, MD  cephALEXin (KEFLEX) 500 MG capsule 2 caps po bid x 7 days 11/02/13   [name] Relic, MD  metoCLOPramide (REGLAN) 10 MG tablet Take 1 tablet (10 mg total) by mouth every 6 (six) hours as needed for nausea (nausea/headache). 11/02/13   [name] Relic, MD   ondansetron (ZOFRAN ODT) 8 MG disintegrating tablet 8mg  ODT q4 hours prn nausea 11/02/13   [name] Relic, MD  oxyCODONE-acetaminophen (PERCOCET) 5-325 MG per tablet Take 2 tablets by mouth every 4 (four) hours as needed. 11/02/13   [name] Relic, MD   Triage Vitals: BP 117/75  Pulse 85  Temp(Src) 98.3 F (36.8 C) (Oral)  Resp 18  Ht 6' (1.829 m)  Wt 180 lb (81.647 kg)  BMI 24.41 kg/m2  SpO2 100% Physical Exam  Nursing note and vitals reviewed. Constitutional:  Awake, alert, nontoxic appearance with baseline speech for patient.  HENT:  Head: Atraumatic.  Mouth/Throat: No oropharyngeal exudate.  Eyes: Pupils are equal, round, and reactive to light. Right eye exhibits no discharge. Left eye exhibits no discharge.  Neck: Neck supple.  Cardiovascular: Normal rate and regular rhythm.   No murmur heard. Pulmonary/Chest: Effort normal and breath sounds normal. No stridor. No respiratory distress. He has no wheezes. He has no rales. He exhibits no tenderness.  Abdominal: Soft. Bowel sounds are normal. He exhibits no mass. There is tenderness (moderate tenderness to left side). There is no rebound.  Positive left CVAT.  Genitourinary:  No scrotal tenderness. POCUS. Positive left hydronephrosis.   Musculoskeletal: He exhibits no tenderness.  Baseline ROM, moves extremities with no obvious new focal weakness.  Lymphadenopathy:    He has no cervical adenopathy.  Neurological:  Awake, alert, cooperative and aware of situation  Skin: No rash noted.  Psychiatric: He has a normal mood and affect.    ED Course  Procedures (including critical care time) DIAGNOSTIC STUDIES: Oxygen Saturation is 100% on room air, normal by my interpretation.    COORDINATION OF CARE: 8:06 AM - Patient / Family / Caregiver understand and agree with initial ED impression and plan with expectations set for ED visit. Suspect contaminated urine. Patient unable to give another sample.   Patient / Family / Caregiver  informed of clinical course, understand medical decision-making process, and agree with plan.  Labs Review Labs Reviewed  URINALYSIS, ROUTINE W REFLEX MICROSCOPIC - Abnormal; Notable for the following:    APPearance CLOUDY (*)    Protein, ur 100 (*)    Nitrite POSITIVE (*)    All other components within normal limits  URINE MICROSCOPIC-ADD ON - Abnormal; Notable for the following:    Squamous Epithelial / LPF MANY (*)    Bacteria, UA MANY (*)    All other components within normal limits    Imaging Review No results found.   EKG Interpretation None      MDM   Final diagnoses:  Renal colic on left side    I doubt any other EMC precluding discharge at this time including, but not necessarily limited to the following:sepsis.  I personally performed the services described in this documentation, which was scribed in my presence. The recorded information has been reviewed and is accurate.    [name] Relic, MD 11/03/13 [phone]

## 2013-11-02 NOTE — Discharge Instructions (Signed)

## 2015-02-03 ENCOUNTER — Other Ambulatory Visit: Payer: Self-pay | Admitting: Physician Assistant

## 2015-02-04 LAB — LIPID PANEL
CHOL/HDL RATIO: 2.5 ratio (ref <=5.0)
Cholesterol: 209 mg/dL — ABNORMAL HIGH (ref 125–200)
HDL: 83 mg/dL (ref >=40)
LDL Cholesterol: 115 mg/dL (ref <130)
Triglycerides: 56 mg/dL (ref <150)
VLDL: 11 mg/dL (ref <30)

## 2015-02-04 LAB — COMPREHENSIVE METABOLIC PANEL
ALK PHOS: 58 U/L (ref 40–115)
ALT: 21 U/L (ref 9–46)
AST: 39 U/L (ref 10–40)
Albumin: 3.9 g/dL (ref 3.6–5.1)
BUN: 11 mg/dL (ref 7–25)
CALCIUM: 9.1 mg/dL (ref 8.6–10.3)
CHLORIDE: 101 mmol/L (ref 98–110)
CO2: 24 mmol/L (ref 20–31)
Creat: 1.05 mg/dL (ref 0.60–1.35)
GLUCOSE: 60 mg/dL — AB (ref 65–99)
POTASSIUM: 5.4 mmol/L — AB (ref 3.5–5.3)
Sodium: 137 mmol/L (ref 135–146)
Total Bilirubin: 0.5 mg/dL (ref 0.2–1.2)
Total Protein: 7.5 g/dL (ref 6.1–8.1)

## 2015-02-08 ENCOUNTER — Encounter: Payer: Self-pay | Admitting: Physician Assistant

## 2015-02-08 ENCOUNTER — Ambulatory Visit: Payer: Self-pay | Admitting: Physician Assistant

## 2015-02-08 VITALS — BP 112/82 | HR 77 | Temp 97.5°F | Ht 71.25 in | Wt 168.2 lb

## 2015-02-08 DIAGNOSIS — E785 Hyperlipidemia, unspecified: Secondary | ICD-10-CM

## 2015-02-08 DIAGNOSIS — F17219 Nicotine dependence, cigarettes, with unspecified nicotine-induced disorders: Secondary | ICD-10-CM | POA: Insufficient documentation

## 2015-02-08 DIAGNOSIS — F17210 Nicotine dependence, cigarettes, uncomplicated: Secondary | ICD-10-CM

## 2015-02-08 DIAGNOSIS — I10 Essential (primary) hypertension: Secondary | ICD-10-CM | POA: Insufficient documentation

## 2015-02-08 DIAGNOSIS — E875 Hyperkalemia: Secondary | ICD-10-CM

## 2015-02-08 MED ORDER — LOVASTATIN 20 MG PO TABS: 20.0000 mg | ORAL_TABLET | Freq: Every day | ORAL | Status: DC

## 2015-02-08 NOTE — Progress Notes (Signed)
BP 112/82 mmHg  Pulse 77  Temp(Src) 97.5 F (36.4 C)  Ht 5' 11.25" (1.81 m)  Wt 168 lb 3.2 oz (76.295 kg)  BMI 23.29 kg/m2  SpO2 99%   Subjective:    Patient ID: [name] [name], male    DOB: May 18, 1965, 49 y.o.   MRN: [id number]  HPI: [name] [name] is a 49 y.o. male presenting on 02/08/2015 for Follow-up   HPI  Chief Complaint  Patient presents with  . Hyperlipidemia    pt states he is feeling well, pt got labwork done on wednesday.      Relevant past medical, surgical, family and social history reviewed and updated as indicated. Interim medical history since our last visit reviewed. Allergies and medications reviewed and updated.  Review of Systems  Constitutional: Negative for fever and unexpected weight change.  HENT: Negative for hearing loss and sneezing.   Eyes: Negative for pain.  Respiratory: Negative for cough, shortness of breath and wheezing.   Cardiovascular: Negative for chest pain, palpitations and leg swelling.  Gastrointestinal: Negative for vomiting, abdominal pain and blood in stool.  Endocrine: Negative for polydipsia.  Genitourinary: Negative for dysuria.  Musculoskeletal: Positive for joint swelling. Negative for back pain.  Skin: Negative for rash.  Neurological: Negative for seizures, syncope and headaches.  Hematological: Negative for adenopathy.  Psychiatric/Behavioral: Negative for dysphoric mood.    Per HPI unless specifically indicated above     Objective:    BP 112/82 mmHg  Pulse 77  Temp(Src) 97.5 F (36.4 C)  Ht 5' 11.25" (1.81 m)  Wt 168 lb 3.2 oz (76.295 kg)  BMI 23.29 kg/m2  SpO2 99%  Wt Readings from Last 3 Encounters:  02/08/15 168 lb 3.2 oz (76.295 kg)  11/02/13 180 lb (81.647 kg)  01/09/13 176 lb 5.9 oz (80 kg)    Physical Exam  Constitutional: He is oriented to person, place, and time. He appears well-developed and well-nourished.  Neck: Neck supple.  Cardiovascular: Normal rate and regular rhythm.    Pulmonary/Chest: Effort normal and breath sounds normal. He has no wheezes.  Abdominal: Soft. He exhibits no mass. There is no tenderness.  Musculoskeletal: He exhibits no edema.  Lymphadenopathy:    He has no cervical adenopathy.  Neurological: He is alert and oriented to person, place, and time.  Skin: Skin is warm and dry.  Psychiatric: He has a normal mood and affect. His behavior is normal.  Vitals reviewed.   Results for orders placed or performed in visit on 02/03/15  Comprehensive metabolic panel  Result Value Ref Range   Sodium 137 135 - 146 mmol/L   Potassium 5.4 (H) 3.5 - 5.3 mmol/L   Chloride 101 98 - 110 mmol/L   CO2 24 20 - 31 mmol/L   Glucose, Bld 60 (L) 65 - 99 mg/dL   BUN 11 7 - 25 mg/dL   Creat 1.05 0.60 - 1.35 mg/dL   Total Bilirubin 0.5 0.2 - 1.2 mg/dL   Alkaline Phosphatase 58 40 - 115 U/L   AST 39 10 - 40 U/L   ALT 21 9 - 46 U/L   Total Protein 7.5 6.1 - 8.1 g/dL   Albumin 3.9 3.6 - 5.1 g/dL   Calcium 9.1 8.6 - 10.3 mg/dL  Lipid panel  Result Value Ref Range   Cholesterol 209 (H) 125 - 200 mg/dL   Triglycerides 56 <150 mg/dL   HDL 83 >=40 mg/dL   Total CHOL/HDL Ratio 2.5 <=5.0 Ratio   VLDL  11 <30 mg/dL   LDL Cholesterol 115 <130 mg/dL        Assessment & Plan:   htn-  Cont current meds  hyperlipideimia-  Pt says he could afford lovastatin at [facility] for $4/mo.  He was taken off the crestor due to elevations in LFTs in the past.  Will start the new medication  Hyperkalemia- Pt to go on next Monday and have K+ checked  Smoking- Counseled on smoking cessation  F/u OV 3 mo

## 2015-04-01 ENCOUNTER — Telehealth: Payer: Self-pay | Admitting: Student

## 2015-04-01 ENCOUNTER — Other Ambulatory Visit: Payer: Self-pay | Admitting: Physician Assistant

## 2015-04-01 DIAGNOSIS — E875 Hyperkalemia: Secondary | ICD-10-CM

## 2015-04-01 NOTE — Telephone Encounter (Signed)
Called and left pt a voicemail for pt to call back. Pt was told on 02-08-15 to go to the lab to recheck his potassium since it was not within normal range, but pt has not gone yet. Pt is to go to the lab to recheck K+

## 2015-04-08 ENCOUNTER — Telehealth: Payer: Self-pay | Admitting: Student

## 2015-04-08 NOTE — Telephone Encounter (Signed)
Called and left voicemail for pt to call back. Pt needs to recheck K+ and has not gone to the lab to do so.

## 2015-04-13 ENCOUNTER — Encounter: Payer: Self-pay | Admitting: Student

## 2015-04-13 ENCOUNTER — Telehealth: Payer: Self-pay | Admitting: Student

## 2015-04-13 NOTE — Telephone Encounter (Signed)
Called and left voicemail for pt to call back. Will send letter telling pt to call the clinic in regards to him not going to the lab to recheck his potassium

## 2015-04-14 ENCOUNTER — Telehealth: Payer: Self-pay | Admitting: Student

## 2015-04-14 NOTE — Telephone Encounter (Signed)
Called and left voicemail for pt to call back.

## 2015-04-15 ENCOUNTER — Other Ambulatory Visit: Payer: Self-pay | Admitting: Physician Assistant

## 2015-04-15 LAB — POTASSIUM: POTASSIUM: 4 mmol/L (ref 3.5–5.3)

## 2015-04-15 MED ORDER — LISINOPRIL 10 MG PO TABS: 10.0000 mg | ORAL_TABLET | Freq: Every day | ORAL | Status: DC

## 2015-05-11 ENCOUNTER — Ambulatory Visit: Payer: Self-pay | Admitting: Physician Assistant

## 2015-05-19 ENCOUNTER — Encounter: Payer: Self-pay | Admitting: Physician Assistant

## 2015-05-19 ENCOUNTER — Ambulatory Visit: Payer: Self-pay | Admitting: Physician Assistant

## 2015-05-19 VITALS — BP 134/84 | HR 80 | Temp 97.3°F | Ht 71.25 in | Wt 175.1 lb

## 2015-05-19 DIAGNOSIS — I10 Essential (primary) hypertension: Secondary | ICD-10-CM

## 2015-05-19 DIAGNOSIS — E785 Hyperlipidemia, unspecified: Secondary | ICD-10-CM

## 2015-05-19 DIAGNOSIS — F17210 Nicotine dependence, cigarettes, uncomplicated: Secondary | ICD-10-CM

## 2015-05-19 MED ORDER — ATORVASTATIN CALCIUM 20 MG PO TABS: 20.0000 mg | ORAL_TABLET | Freq: Every day | ORAL | Status: DC

## 2015-05-19 NOTE — Patient Instructions (Signed)
Smoking Cessation, Tips for Success If you are ready to quit smoking, congratulations! You have chosen to help yourself be healthier. Cigarettes bring nicotine, tar, carbon monoxide, and other irritants into your body. Your lungs, heart, and blood vessels will be able to work better without these poisons. There are many different ways to quit smoking. Nicotine gum, nicotine patches, a nicotine inhaler, or nicotine nasal spray can help with physical craving. Hypnosis, support groups, and medicines help break the habit of smoking. WHAT THINGS CAN I DO TO MAKE QUITTING EASIER?  Here are some tips to help you quit for good:  Pick a date when you will quit smoking completely. Tell all of your friends and family about your plan to quit on that date.  Do not try to slowly cut down on the number of cigarettes you are smoking. Pick a quit date and quit smoking completely starting on that day.  Throw away all cigarettes.   Clean and remove all ashtrays from your home, work, and car.  On a card, write down your reasons for quitting. Carry the card with you and read it when you get the urge to smoke.  Cleanse your body of nicotine. Drink enough water and fluids to keep your urine clear or pale yellow. Do this after quitting to flush the nicotine from your body.  Learn to predict your moods. Do not let a bad situation be your excuse to have a cigarette. Some situations in your life might tempt you into wanting a cigarette.  Never have "just one" cigarette. It leads to wanting another and another. Remind yourself of your decision to quit.  Change habits associated with smoking. If you smoked while driving or when feeling stressed, try other activities to replace smoking. Stand up when drinking your coffee. Brush your teeth after eating. Sit in a different chair when you read the paper. Avoid alcohol while trying to quit, and try to drink fewer caffeinated beverages. Alcohol and caffeine may urge you to  smoke.  Avoid foods and drinks that can trigger a desire to smoke, such as sugary or spicy foods and alcohol.  Ask people who smoke not to smoke around you.  Have something planned to do right after eating or having a cup of coffee. For example, plan to take a walk or exercise.  Try a relaxation exercise to calm you down and decrease your stress. Remember, you may be tense and nervous for the first 2 weeks after you quit, but this will pass.  Find new activities to keep your hands busy. Play with a pen, coin, or rubber band. Doodle or draw things on paper.  Brush your teeth right after eating. This will help cut down on the craving for the taste of tobacco after meals. You can also try mouthwash.   Use oral substitutes in place of cigarettes. Try using lemon drops, carrots, cinnamon sticks, or chewing gum. Keep them handy so they are available when you have the urge to smoke.  When you have the urge to smoke, try deep breathing.  Designate your home as a nonsmoking area.  If you are a heavy smoker, ask your health care provider about a prescription for nicotine chewing gum. It can ease your withdrawal from nicotine.  Reward yourself. Set aside the cigarette money you save and buy yourself something nice.  Look for support from others. Join a support group or smoking cessation program. Ask someone at home or at work to help you with your plan   to quit smoking.  Always ask yourself, "Do I need this cigarette or is this just a reflex?" Tell yourself, "Today, I choose not to smoke," or "I do not want to smoke." You are reminding yourself of your decision to quit.  Do not replace cigarette smoking with electronic cigarettes (commonly called e-cigarettes). The safety of e-cigarettes is unknown, and some may contain harmful chemicals.  If you relapse, do not give up! Plan ahead and think about what you will do the next time you get the urge to smoke. HOW WILL I FEEL WHEN I QUIT SMOKING? You  may have symptoms of withdrawal because your body is used to nicotine (the addictive substance in cigarettes). You may crave cigarettes, be irritable, feel very hungry, cough often, get headaches, or have difficulty concentrating. The withdrawal symptoms are only temporary. They are strongest when you first quit but will go away within 10-14 days. When withdrawal symptoms occur, stay in control. Think about your reasons for quitting. Remind yourself that these are signs that your body is healing and getting used to being without cigarettes. Remember that withdrawal symptoms are easier to treat than the major diseases that smoking can cause.  Even after the withdrawal is over, expect periodic urges to smoke. However, these cravings are generally short lived and will go away whether you smoke or not. Do not smoke! WHAT RESOURCES ARE AVAILABLE TO HELP ME QUIT SMOKING? Your health care provider can direct you to community resources or hospitals for support, which may include:  Group support.  Education.  Hypnosis.  Therapy.   This information is not intended to replace advice given to you by your health care provider. Make sure you discuss any questions you have with your health care provider.   Document Released: 01/14/2004 Document Revised: 05/08/2014 Document Reviewed: 10/03/2012 Elsevier Interactive Patient Education 2016 [employer]  

## 2015-05-19 NOTE — Progress Notes (Signed)
   BP 134/84 mmHg  Pulse 80  Temp(Src) 97.3 F (36.3 C)  Ht 5' 11.25" (1.81 m)  Wt 175 lb 1.6 oz (79.425 kg)  BMI 24.24 kg/m2  SpO2 99%   Subjective:    Patient ID: [name] [name], male    DOB: 1965/12/02, 50 y.o.   MRN: [id number]  HPI: [name] [name] is a 50 y.o. male presenting on 05/19/2015 for Hypertension and Hyperlipidemia   HPI   Pt is feeling good.  He stopped his lovastatin b/c he couldn't afford it. Off it for 2 months.    Pt is still smoking.  Denies sob but does sometimes hear wheezes.   Relevant past medical, surgical, family and social history reviewed and updated as indicated. Interim medical history since our last visit reviewed. Allergies and medications reviewed and updated.   Current outpatient prescriptions:  .  lisinopril (PRINIVIL,ZESTRIL) 10 MG tablet, Take 1 tablet (10 mg total) by mouth daily., Disp: 90 tablet, Rfl: 2 .  lovastatin (MEVACOR) 20 MG tablet, Take 1 tablet (20 mg total) by mouth at bedtime. (Patient not taking: Reported on 05/19/2015), Disp: 30 tablet, Rfl: 3   Review of Systems  Constitutional: Negative for unexpected weight change.  HENT: Negative for sore throat, trouble swallowing and voice change.   Respiratory: Positive for wheezing. Negative for chest tightness and shortness of breath.   Cardiovascular: Negative for chest pain and leg swelling.  Gastrointestinal: Negative for abdominal pain.  Skin: Negative for rash.  Psychiatric/Behavioral: Negative for dysphoric mood.    Per HPI unless specifically indicated above     Objective:    BP 134/84 mmHg  Pulse 80  Temp(Src) 97.3 F (36.3 C)  Ht 5' 11.25" (1.81 m)  Wt 175 lb 1.6 oz (79.425 kg)  BMI 24.24 kg/m2  SpO2 99%  Wt Readings from Last 3 Encounters:  05/19/15 175 lb 1.6 oz (79.425 kg)  02/08/15 168 lb 3.2 oz (76.295 kg)  11/02/13 180 lb (81.647 kg)    Physical Exam  Constitutional: He is oriented to person, place, and time. He appears well-developed and  well-nourished.  HENT:  Head: Normocephalic and atraumatic.  Neck: Neck supple.  Cardiovascular: Normal rate and regular rhythm.   Pulmonary/Chest: Effort normal. No respiratory distress. He has wheezes (soft expiratory). He has no rales.  Abdominal: Soft. Bowel sounds are normal. There is no hepatosplenomegaly. There is no tenderness.  Musculoskeletal: He exhibits no edema.  Lymphadenopathy:    He has no cervical adenopathy.  Neurological: He is alert and oriented to person, place, and time.  Skin: Skin is warm and dry.  Psychiatric: He has a normal mood and affect. His behavior is normal.  Vitals reviewed.       Assessment & Plan:   Encounter Diagnoses  Name Primary?  . Essential hypertension, benign Yes  . Hyperlipidemia   . Cigarette nicotine dependence, uncomplicated     -Check labs- will call with results -Order lipitor thru [facility] -counseled on smoking cessation -f/u 3 mo. RTO sooner prn

## 2015-05-20 IMAGING — US US ABDOMEN COMPLETE
1 series · 13 of 25 positions shown · non-contrast
Comparison: Noncontrast abdominal and pelvic CT scan [DATE]

CLINICAL DATA: Abnormal liver function studies for several years.
History of previous appendectomy. History of kidney stones,
hyperlipidemia.

EXAM:
ABDOMEN ULTRASOUND COMPLETE

[Series 1: us abdomen complete · 0.15mm/px · 13 of 120 slices shown]
[im 1/120]
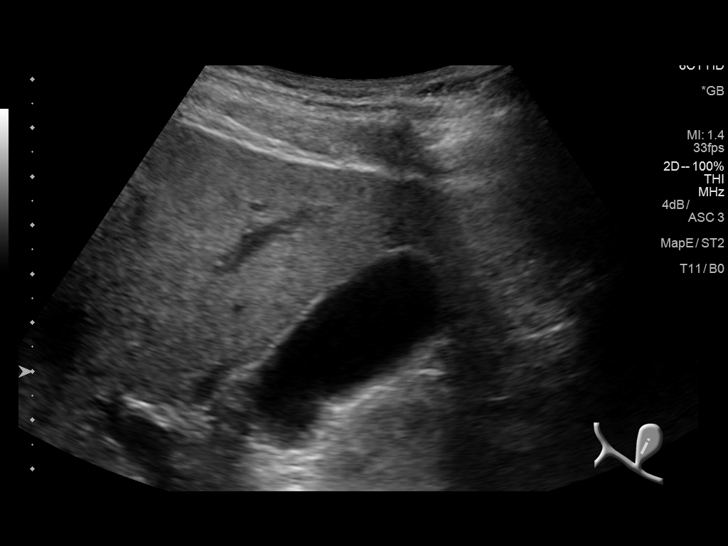
[im 10/120]
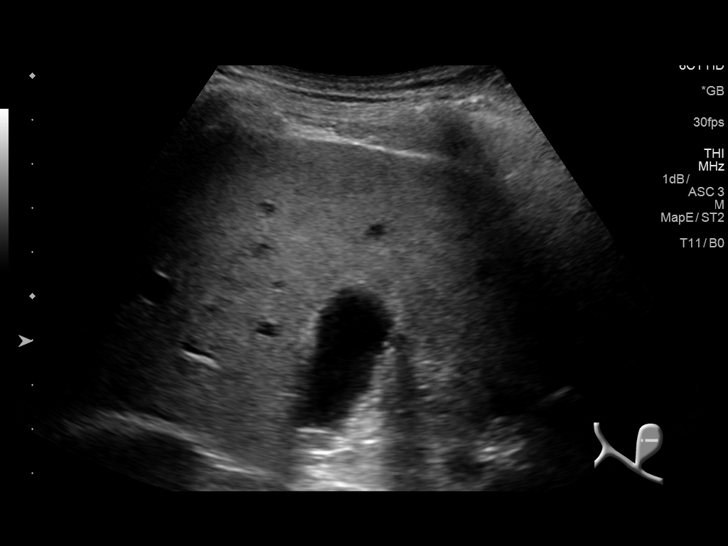
[im 20/120]
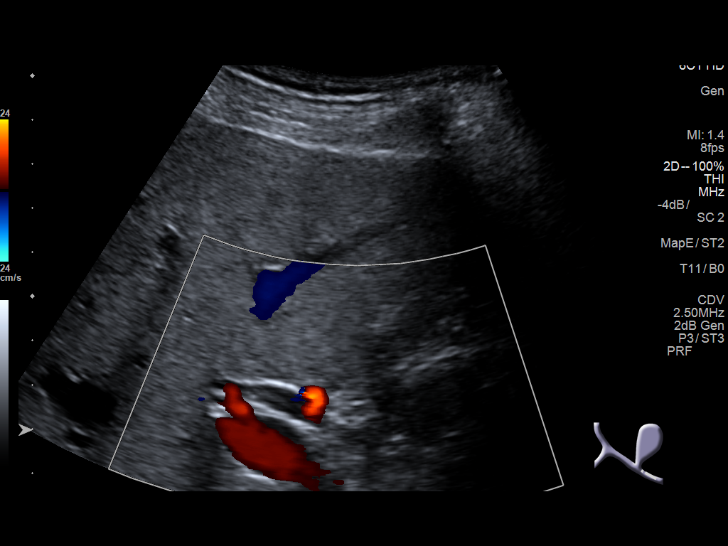
[im 30/120]
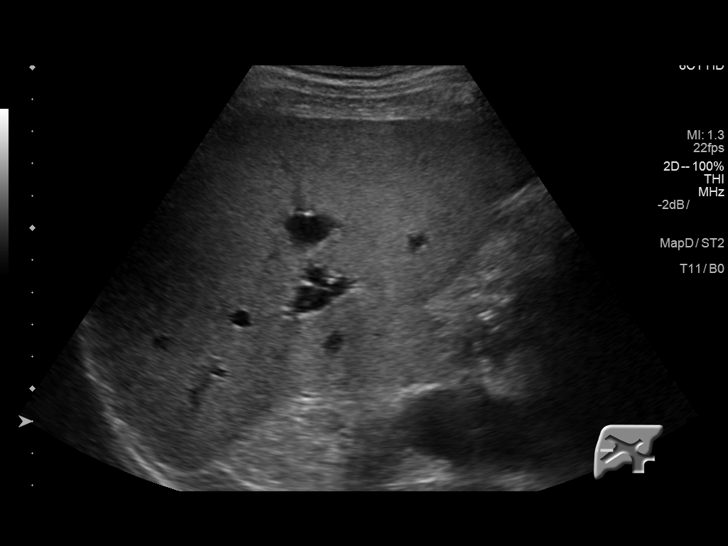
[im 40/120]
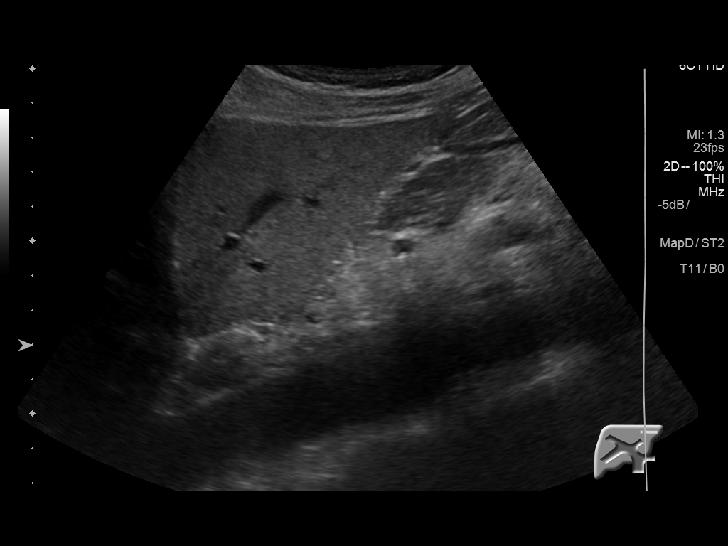
[im 50/120]
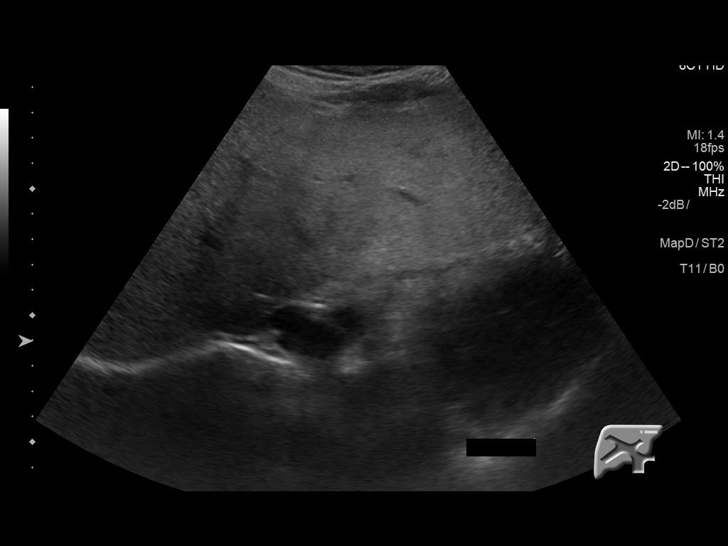
[im 60/120]
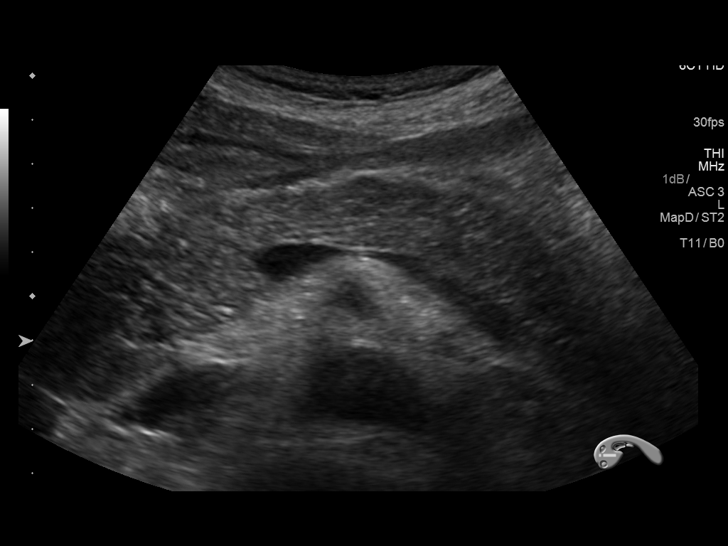
[im 70/120]
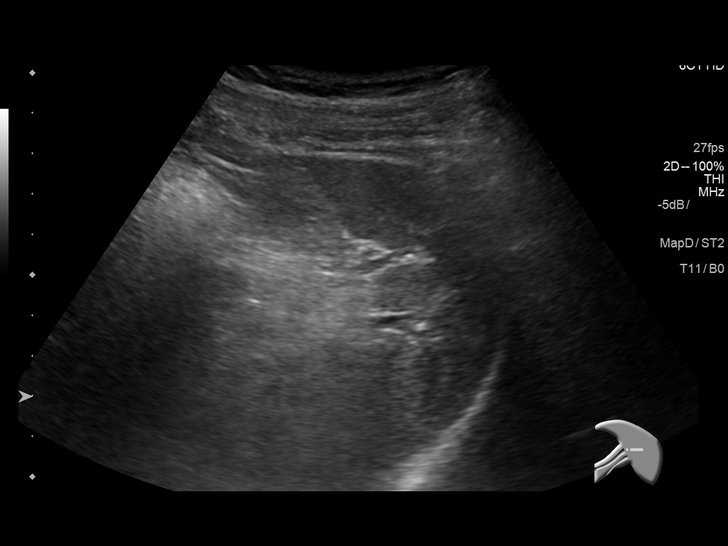
[im 80/120]
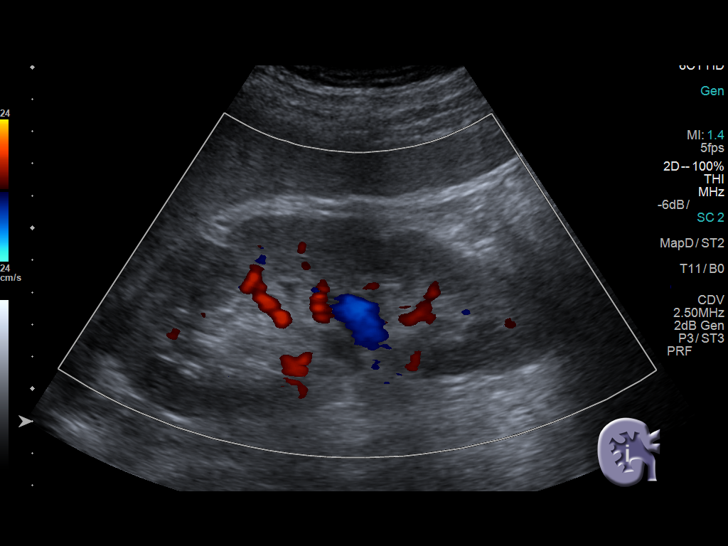
[im 90/120]
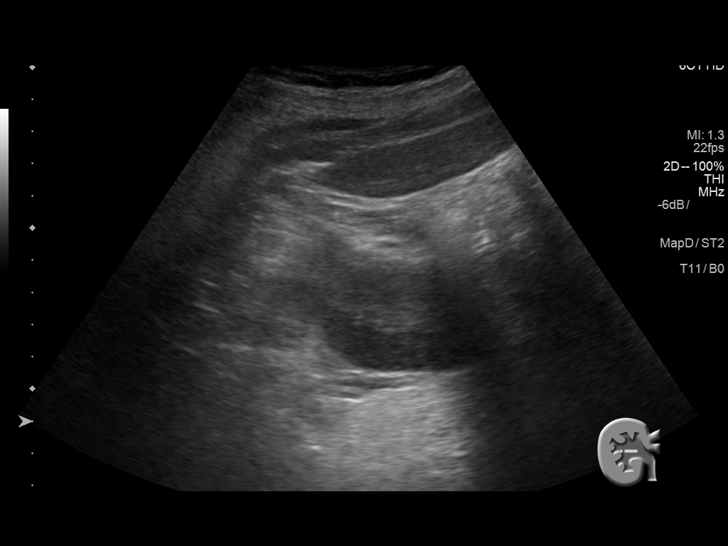
[im 100/120]
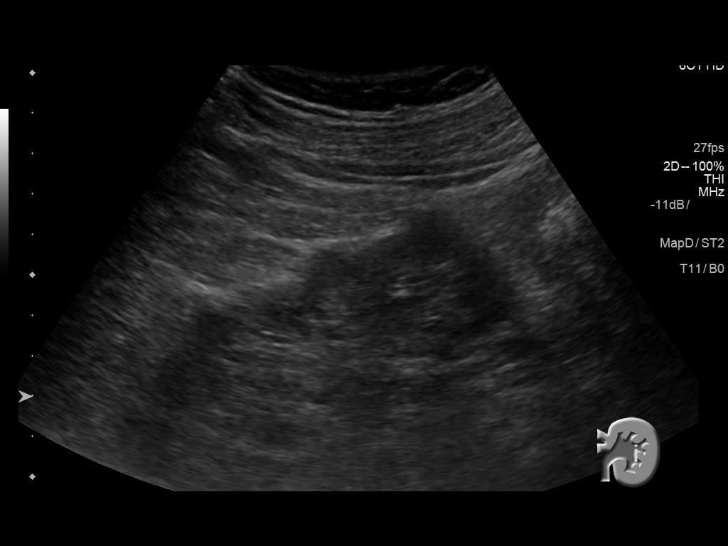
[im 110/120]
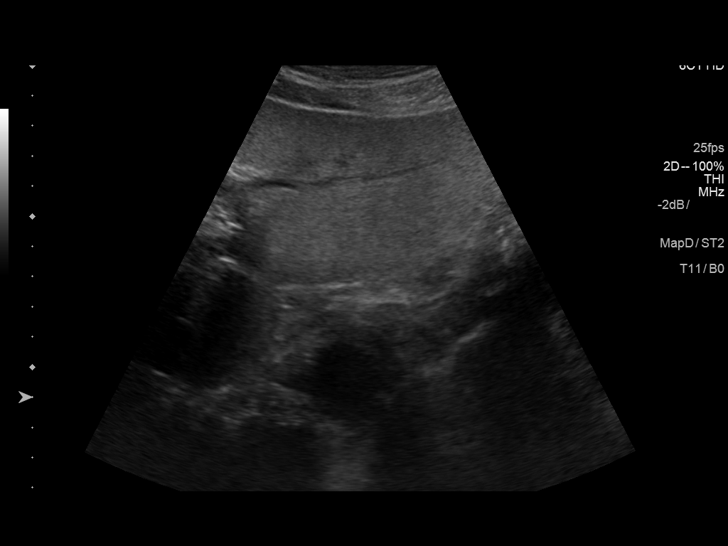
[im 120/120]
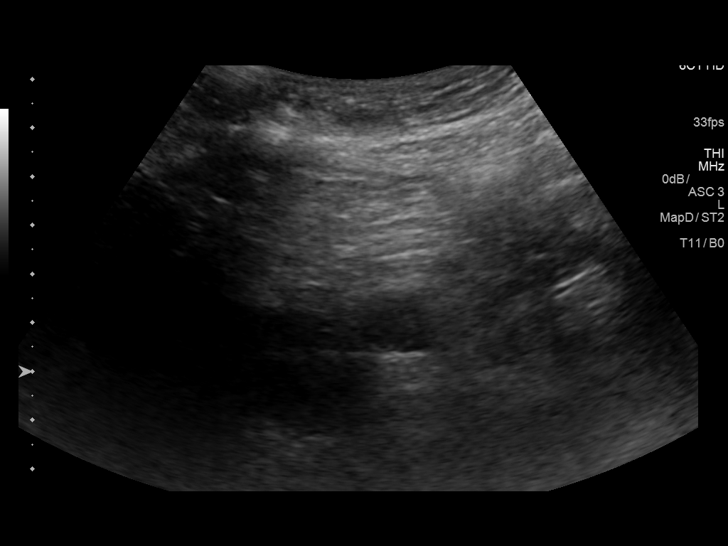

[13 of 25 positions shown; findings below may reference images not displayed]

FINDINGS: Gallbladder: The gallbladder is adequately distended with no
evidence of stones, wall thickening, or pericholecystic fluid. There
is no positive sonographic Murphy's sign.

Common bile duct: Diameter: 5 mm

Liver: Increased hepatic echotexture most compatible with fatty
infiltrative change. The surface contour appears smooth. There is no
intrahepatic ductal dilation. Portal vein is patent on color Doppler
imaging with normal direction of blood flow towards the liver.

IVC: No abnormality visualized.

Pancreas: The pancreas demonstrates mildly increased echotexture.
There is no focal mass nor ductal dilation.

Spleen: Size and appearance within normal limits.

Right Kidney: Length: 14.2 cm. The renal cortical echotexture
remains lower than that of the adjacent liver. There is no
hydronephrosis. No obstructing stones are observed.

Left Kidney: Length: 10.2 cm. The renal cortical echotexture is
similar to that on the right but there is diffuse cortical thinning.
There is no hydronephrosis nor obstructing stones.

Abdominal aorta: Limited visualization of the bifurcation due to
bowel gas. No aneurysm observed.

Other findings: No ascites is demonstrated.
IMPRESSION: Increased hepatic echotexture compatible with fatty infiltrative
change. No cirrhotic changes are observed. No suspicious masses.
Normal appearing gallbladder.

Small left kidney as compared to the right exhibiting cortical
thinning.

## 2015-06-03 ENCOUNTER — Other Ambulatory Visit: Payer: Self-pay | Admitting: Physician Assistant

## 2015-06-03 LAB — COMPLETE METABOLIC PANEL WITH GFR
ALT: 8 U/L — AB (ref 9–46)
AST: 15 U/L (ref 10–40)
Albumin: 3.8 g/dL (ref 3.6–5.1)
Alkaline Phosphatase: 56 U/L (ref 40–115)
BUN: 10 mg/dL (ref 7–25)
CALCIUM: 9 mg/dL (ref 8.6–10.3)
CHLORIDE: 105 mmol/L (ref 98–110)
CO2: 25 mmol/L (ref 20–31)
CREATININE: 1.06 mg/dL (ref 0.60–1.35)
GFR, EST NON AFRICAN AMERICAN: 82 mL/min (ref >=60)
Glucose, Bld: 91 mg/dL (ref 65–99)
POTASSIUM: 4.7 mmol/L (ref 3.5–5.3)
Sodium: 139 mmol/L (ref 135–146)
Total Bilirubin: 0.4 mg/dL (ref 0.2–1.2)
Total Protein: 7.2 g/dL (ref 6.1–8.1)

## 2015-06-03 LAB — LIPID PANEL
CHOL/HDL RATIO: 3.8 ratio (ref <=5.0)
Cholesterol: 177 mg/dL (ref 125–200)
HDL: 47 mg/dL (ref >=40)
LDL CALC: 116 mg/dL (ref <130)
TRIGLYCERIDES: 72 mg/dL (ref <150)
VLDL: 14 mg/dL (ref <30)

## 2015-08-13 ENCOUNTER — Other Ambulatory Visit: Payer: Self-pay | Admitting: Physician Assistant

## 2015-08-13 LAB — POTASSIUM: Potassium: 5 mmol/L (ref 3.5–5.3)

## 2015-08-17 ENCOUNTER — Encounter: Payer: Self-pay | Admitting: Physician Assistant

## 2015-08-17 ENCOUNTER — Ambulatory Visit: Payer: Self-pay | Admitting: Physician Assistant

## 2015-08-17 VITALS — BP 114/72 | HR 82 | Temp 97.7°F | Ht 71.25 in | Wt 169.0 lb

## 2015-08-17 DIAGNOSIS — F17210 Nicotine dependence, cigarettes, uncomplicated: Secondary | ICD-10-CM

## 2015-08-17 DIAGNOSIS — E785 Hyperlipidemia, unspecified: Secondary | ICD-10-CM

## 2015-08-17 DIAGNOSIS — I10 Essential (primary) hypertension: Secondary | ICD-10-CM

## 2015-08-17 DIAGNOSIS — Z1211 Encounter for screening for malignant neoplasm of colon: Secondary | ICD-10-CM

## 2015-08-17 DIAGNOSIS — Z125 Encounter for screening for malignant neoplasm of prostate: Secondary | ICD-10-CM

## 2015-08-17 NOTE — Progress Notes (Signed)
BP 114/72 mmHg  Pulse 82  Temp(Src) 97.7 F (36.5 C)  Ht 5' 11.25" (1.81 m)  Wt 169 lb (76.658 kg)  BMI 23.40 kg/m2  SpO2 99%   Subjective:    Patient ID: [name] [name], male    DOB: 04-10-1966, 50 y.o.   MRN: [id number]  HPI: [name] [name] is a 49 y.o. male presenting on 08/17/2015 for Hypertension and Hyperlipidemia   HPI   Pt just started lipitor in [place].  He is doing well.  He is still smoking.  Relevant past medical, surgical, family and social history reviewed and updated as indicated. Interim medical history since our last visit reviewed. Allergies and medications reviewed and updated.  Current outpatient prescriptions:  .  atorvastatin (LIPITOR) 20 MG tablet, Take 1 tablet (20 mg total) by mouth daily., Disp: 90 tablet, Rfl: 1 .  lisinopril (PRINIVIL,ZESTRIL) 10 MG tablet, Take 1 tablet (10 mg total) by mouth daily., Disp: 90 tablet, Rfl: 2   Review of Systems  Constitutional: Negative for fever, chills, diaphoresis, appetite change, fatigue and unexpected weight change.  HENT: Negative for congestion, dental problem, drooling, ear pain, facial swelling, hearing loss, mouth sores, sneezing, sore throat, trouble swallowing and voice change.   Eyes: Negative for pain, discharge, redness, itching and visual disturbance.  Respiratory: Negative for cough, choking, shortness of breath and wheezing.   Cardiovascular: Negative for chest pain, palpitations and leg swelling.  Gastrointestinal: Negative for vomiting, abdominal pain, diarrhea, constipation and blood in stool.  Endocrine: Negative for cold intolerance, heat intolerance and polydipsia.  Genitourinary: Negative for dysuria, hematuria and decreased urine volume.  Musculoskeletal: Negative for back pain, arthralgias and gait problem.  Skin: Negative for rash.  Allergic/Immunologic: Negative for environmental allergies.  Neurological: Negative for seizures, syncope, light-headedness and headaches.  Hematological:  Negative for adenopathy.  Psychiatric/Behavioral: Negative for suicidal ideas, dysphoric mood and agitation. The patient is not nervous/anxious.     Per HPI unless specifically indicated above     Objective:    BP 114/72 mmHg  Pulse 82  Temp(Src) 97.7 F (36.5 C)  Ht 5' 11.25" (1.81 m)  Wt 169 lb (76.658 kg)  BMI 23.40 kg/m2  SpO2 99%  Wt Readings from Last 3 Encounters:  08/17/15 169 lb (76.658 kg)  05/19/15 175 lb 1.6 oz (79.425 kg)  02/08/15 168 lb 3.2 oz (76.295 kg)    Physical Exam  Constitutional: He is oriented to person, place, and time. He appears well-developed and well-nourished.  HENT:  Head: Normocephalic and atraumatic.  Neck: Neck supple.  Cardiovascular: Normal rate and regular rhythm.   Pulmonary/Chest: Effort normal and breath sounds normal. He has no wheezes.  Abdominal: Soft. Bowel sounds are normal. There is no hepatosplenomegaly. There is no tenderness.  Musculoskeletal: He exhibits no edema.  Lymphadenopathy:    He has no cervical adenopathy.  Neurological: He is alert and oriented to person, place, and time.  Skin: Skin is warm and dry.  Psychiatric: He has a normal mood and affect. His behavior is normal.  Vitals reviewed.   Results for orders placed or performed in visit on 08/13/15  Potassium  Result Value Ref Range   Potassium 5.0 3.5 - 5.3 mmol/L      Assessment & Plan:   Encounter Diagnoses  Name Primary?  . Essential hypertension, benign Yes  . Hyperlipidemia   . Cigarette nicotine dependence, uncomplicated   . Special screening for malignant neoplasms, colon   . Screening for prostate cancer      -  Gave ifobt test to return to office for colon cancer screening -counseled on smoking cessation -F/u 2 months.  RTO sooner prn

## 2015-08-19 LAB — IFOBT (OCCULT BLOOD): IMMUNOLOGICAL FECAL OCCULT BLOOD TEST: NEGATIVE

## 2015-10-12 ENCOUNTER — Other Ambulatory Visit: Payer: Self-pay | Admitting: Student

## 2015-10-12 DIAGNOSIS — I10 Essential (primary) hypertension: Secondary | ICD-10-CM

## 2015-10-12 DIAGNOSIS — E785 Hyperlipidemia, unspecified: Secondary | ICD-10-CM

## 2015-10-12 DIAGNOSIS — Z125 Encounter for screening for malignant neoplasm of prostate: Secondary | ICD-10-CM

## 2015-10-15 LAB — COMPLETE METABOLIC PANEL WITH GFR
ALT: 50 U/L — AB (ref 9–46)
AST: 75 U/L — ABNORMAL HIGH (ref 10–35)
Albumin: 3.5 g/dL — ABNORMAL LOW (ref 3.6–5.1)
Alkaline Phosphatase: 65 U/L (ref 40–115)
BILIRUBIN TOTAL: 0.4 mg/dL (ref 0.2–1.2)
BUN: 9 mg/dL (ref 7–25)
CALCIUM: 8.6 mg/dL (ref 8.6–10.3)
CHLORIDE: 104 mmol/L (ref 98–110)
CO2: 20 mmol/L (ref 20–31)
CREATININE: 0.9 mg/dL (ref 0.70–1.33)
Glucose, Bld: 81 mg/dL (ref 65–99)
Potassium: 4.1 mmol/L (ref 3.5–5.3)
Sodium: 137 mmol/L (ref 135–146)
TOTAL PROTEIN: 6.8 g/dL (ref 6.1–8.1)

## 2015-10-15 LAB — LIPID PANEL
CHOLESTEROL: 171 mg/dL (ref 125–200)
HDL: 89 mg/dL (ref >=40)
LDL CALC: 70 mg/dL (ref <130)
TRIGLYCERIDES: 62 mg/dL (ref <150)
Total CHOL/HDL Ratio: 1.9 Ratio (ref <=5.0)
VLDL: 12 mg/dL (ref <30)

## 2015-10-15 LAB — PSA: PSA: 0.77 ng/mL (ref <=4.00)

## 2015-10-19 ENCOUNTER — Ambulatory Visit: Payer: Self-pay | Admitting: Physician Assistant

## 2015-10-19 ENCOUNTER — Encounter: Payer: Self-pay | Admitting: Physician Assistant

## 2015-10-19 VITALS — BP 128/84 | HR 86 | Temp 97.2°F | Ht 71.25 in | Wt 171.0 lb

## 2015-10-19 DIAGNOSIS — I10 Essential (primary) hypertension: Secondary | ICD-10-CM

## 2015-10-19 DIAGNOSIS — E785 Hyperlipidemia, unspecified: Secondary | ICD-10-CM

## 2015-10-19 DIAGNOSIS — F17210 Nicotine dependence, cigarettes, uncomplicated: Secondary | ICD-10-CM

## 2015-10-19 NOTE — Progress Notes (Signed)
BP 128/84 mmHg  Pulse 86  Temp(Src) 97.2 F (36.2 C)  Ht 5' 11.25" (1.81 m)  Wt 171 lb (77.565 kg)  BMI 23.68 kg/m2  SpO2 97%   Subjective:    Patient ID: [name] [name], male    DOB: Oct 12, 1965, 50 y.o.   MRN: [id number]  HPI: [name] [name] is a 50 y.o. male presenting on 10/19/2015 for Hypertension and Hyperlipidemia   HPI   Pt is doing well  Relevant past medical, surgical, family and social history reviewed and updated as indicated. Interim medical history since our last visit reviewed. Allergies and medications reviewed and updated.   Current outpatient prescriptions:  .  atorvastatin (LIPITOR) 20 MG tablet, Take 1 tablet (20 mg total) by mouth daily., Disp: 90 tablet, Rfl: 1 .  lisinopril (PRINIVIL,ZESTRIL) 10 MG tablet, Take 1 tablet (10 mg total) by mouth daily., Disp: 90 tablet, Rfl: 2   Review of Systems  Constitutional: Negative for fever, chills, diaphoresis, appetite change, fatigue and unexpected weight change.  HENT: Negative for congestion, dental problem, drooling, ear pain, facial swelling, hearing loss, mouth sores, sneezing, sore throat, trouble swallowing and voice change.   Eyes: Positive for redness. Negative for pain, discharge, itching and visual disturbance.  Respiratory: Positive for wheezing. Negative for cough, choking and shortness of breath.   Cardiovascular: Negative for chest pain, palpitations and leg swelling.  Gastrointestinal: Negative for vomiting, abdominal pain, diarrhea, constipation and blood in stool.  Endocrine: Negative for cold intolerance, heat intolerance and polydipsia.  Genitourinary: Negative for dysuria, hematuria and decreased urine volume.  Musculoskeletal: Negative for back pain, arthralgias and gait problem.  Skin: Negative for rash.  Allergic/Immunologic: Negative for environmental allergies.  Neurological: Negative for seizures, syncope, light-headedness and headaches.  Hematological: Negative for adenopathy.   Psychiatric/Behavioral: Negative for suicidal ideas, dysphoric mood and agitation. The patient is not nervous/anxious.     Per HPI unless specifically indicated above     Objective:    BP 128/84 mmHg  Pulse 86  Temp(Src) 97.2 F (36.2 C)  Ht 5' 11.25" (1.81 m)  Wt 171 lb (77.565 kg)  BMI 23.68 kg/m2  SpO2 97%  Wt Readings from Last 3 Encounters:  10/19/15 171 lb (77.565 kg)  08/17/15 169 lb (76.658 kg)  05/19/15 175 lb 1.6 oz (79.425 kg)    Physical Exam  Constitutional: He is oriented to person, place, and time. He appears well-developed and well-nourished.  HENT:  Head: Normocephalic and atraumatic.  Neck: Neck supple.  Cardiovascular: Normal rate and regular rhythm.   Pulmonary/Chest: Effort normal and breath sounds normal. He has no wheezes.  Abdominal: Soft. Bowel sounds are normal. There is no hepatosplenomegaly. There is no tenderness.  Musculoskeletal: He exhibits no edema.  Lymphadenopathy:    He has no cervical adenopathy.  Neurological: He is alert and oriented to person, place, and time.  Skin: Skin is warm and dry.  Psychiatric: He has a normal mood and affect. His behavior is normal.  Vitals reviewed.   Results for orders placed or performed in visit on 10/12/15  PSA  Result Value Ref Range   PSA 0.77 <=4.00 ng/mL  Lipid Profile  Result Value Ref Range   Cholesterol 171 125 - 200 mg/dL   Triglycerides 62 <150 mg/dL   HDL 89 >=40 mg/dL   Total CHOL/HDL Ratio 1.9 <=5.0 Ratio   VLDL 12 <30 mg/dL   LDL Cholesterol 70 <130 mg/dL  COMPLETE METABOLIC PANEL WITH GFR  Result Value Ref Range  Sodium 137 135 - 146 mmol/L   Potassium 4.1 3.5 - 5.3 mmol/L   Chloride 104 98 - 110 mmol/L   CO2 20 20 - 31 mmol/L   Glucose, Bld 81 65 - 99 mg/dL   BUN 9 7 - 25 mg/dL   Creat 0.90 0.70 - 1.33 mg/dL   Total Bilirubin 0.4 0.2 - 1.2 mg/dL   Alkaline Phosphatase 65 40 - 115 U/L   AST 75 (H) 10 - 35 U/L   ALT 50 (H) 9 - 46 U/L   Total Protein 6.8 6.1 - 8.1 g/dL    Albumin 3.5 (L) 3.6 - 5.1 g/dL   Calcium 8.6 8.6 - 10.3 mg/dL   GFR, Est African American >89 >=60 mL/min   GFR, Est Non African American >89 >=60 mL/min      Assessment & Plan:   Encounter Diagnoses  Name Primary?  . Essential hypertension, benign Yes  . Hyperlipidemia   . Cigarette nicotine dependence, uncomplicated     -reviewed labs with pt  -LFTs up slightly. Pt states he isn't drinking any longer. Will monitor -continue current Rx -counseled smoking cessation -f/u 3 months. RTO sooner prn

## 2015-12-29 ENCOUNTER — Other Ambulatory Visit: Payer: Self-pay | Admitting: Physician Assistant

## 2016-01-10 ENCOUNTER — Other Ambulatory Visit: Payer: Self-pay

## 2016-01-10 DIAGNOSIS — I10 Essential (primary) hypertension: Secondary | ICD-10-CM

## 2016-01-10 DIAGNOSIS — E785 Hyperlipidemia, unspecified: Secondary | ICD-10-CM

## 2016-01-14 LAB — COMPLETE METABOLIC PANEL WITH GFR
ALT: 27 U/L (ref 9–46)
AST: 30 U/L (ref 10–35)
Albumin: 3.8 g/dL (ref 3.6–5.1)
Alkaline Phosphatase: 72 U/L (ref 40–115)
BILIRUBIN TOTAL: 0.4 mg/dL (ref 0.2–1.2)
BUN: 10 mg/dL (ref 7–25)
CO2: 26 mmol/L (ref 20–31)
CREATININE: 1.21 mg/dL (ref 0.70–1.33)
Calcium: 9 mg/dL (ref 8.6–10.3)
Chloride: 104 mmol/L (ref 98–110)
GFR, Est African American: 80 mL/min (ref >=60)
GFR, Est Non African American: 69 mL/min (ref >=60)
GLUCOSE: 97 mg/dL (ref 65–99)
Potassium: 4.7 mmol/L (ref 3.5–5.3)
SODIUM: 137 mmol/L (ref 135–146)
TOTAL PROTEIN: 7.2 g/dL (ref 6.1–8.1)

## 2016-01-14 LAB — LIPID PANEL
Cholesterol: 174 mg/dL (ref 125–200)
HDL: 79 mg/dL (ref >=40)
LDL CALC: 83 mg/dL (ref <130)
Total CHOL/HDL Ratio: 2.2 Ratio (ref <=5.0)
Triglycerides: 58 mg/dL (ref <150)
VLDL: 12 mg/dL (ref <30)

## 2016-01-19 ENCOUNTER — Encounter: Payer: Self-pay | Admitting: Physician Assistant

## 2016-01-19 ENCOUNTER — Ambulatory Visit: Payer: Self-pay | Admitting: Physician Assistant

## 2016-01-19 VITALS — BP 120/78 | HR 75 | Temp 97.9°F | Ht 71.25 in | Wt 171.0 lb

## 2016-01-19 DIAGNOSIS — I10 Essential (primary) hypertension: Secondary | ICD-10-CM

## 2016-01-19 DIAGNOSIS — F17210 Nicotine dependence, cigarettes, uncomplicated: Secondary | ICD-10-CM

## 2016-01-19 DIAGNOSIS — E785 Hyperlipidemia, unspecified: Secondary | ICD-10-CM

## 2016-01-19 NOTE — Progress Notes (Signed)
BP 120/78 (BP Location: Left Arm, Patient Position: Sitting, Cuff Size: Normal)   Pulse 75   Temp 97.9 F (36.6 C) (Other (Comment))   Ht 5' 11.25" (1.81 m)   Wt 171 lb (77.6 kg)   SpO2 98%   BMI 23.68 kg/m    Subjective:    Patient ID: [name] [name], male    DOB: Feb 15, 1966, 50 y.o.   MRN: [id number]  HPI: [name] [name] is a 50 y.o. male presenting on 01/19/2016 for Hyperlipidemia and Hypertension   HPI   Pt doing well.  He is still smoking.  States occassional wheeze, no sob  Relevant past medical, surgical, family and social history reviewed and updated as indicated. Interim medical history since our last visit reviewed. Allergies and medications reviewed and updated.   Current Outpatient Prescriptions:  .  atorvastatin (LIPITOR) 20 MG tablet, TAKE 1 Tablet BY MOUTH ONCE DAILY, Disp: 90 tablet, Rfl: 2 .  lisinopril (PRINIVIL,ZESTRIL) 10 MG tablet, TAKE 1 Tablet BY MOUTH ONCE DAILY, Disp: 90 tablet, Rfl: 2   Review of Systems  Constitutional: Negative for appetite change, chills, diaphoresis, fatigue, fever and unexpected weight change.  HENT: Negative for congestion, drooling, ear pain, facial swelling, hearing loss, mouth sores, sneezing, sore throat, trouble swallowing and voice change.   Eyes: Negative for pain, discharge, redness, itching and visual disturbance.  Respiratory: Positive for wheezing. Negative for cough, choking and shortness of breath.   Cardiovascular: Negative for chest pain, palpitations and leg swelling.  Gastrointestinal: Negative for abdominal pain, blood in stool, constipation, diarrhea and vomiting.  Endocrine: Negative for cold intolerance, heat intolerance and polydipsia.  Genitourinary: Negative for decreased urine volume, dysuria and hematuria.  Musculoskeletal: Negative for arthralgias, back pain and gait problem.  Skin: Negative for rash.  Allergic/Immunologic: Negative for environmental allergies.  Neurological: Negative for seizures,  syncope, light-headedness and headaches.  Hematological: Negative for adenopathy.  Psychiatric/Behavioral: Negative for agitation, dysphoric mood and suicidal ideas. The patient is not nervous/anxious.     Per HPI unless specifically indicated above     Objective:    BP 120/78 (BP Location: Left Arm, Patient Position: Sitting, Cuff Size: Normal)   Pulse 75   Temp 97.9 F (36.6 C) (Other (Comment))   Ht 5' 11.25" (1.81 m)   Wt 171 lb (77.6 kg)   SpO2 98%   BMI 23.68 kg/m   Wt Readings from Last 3 Encounters:  01/19/16 171 lb (77.6 kg)  10/19/15 171 lb (77.6 kg)  08/17/15 169 lb (76.7 kg)    Physical Exam  Constitutional: He is oriented to person, place, and time. He appears well-developed and well-nourished.  HENT:  Head: Normocephalic and atraumatic.  Neck: Neck supple.  Cardiovascular: Normal rate and regular rhythm.   Pulmonary/Chest: Effort normal and breath sounds normal. He has no wheezes.  Abdominal: Soft. Bowel sounds are normal. There is no hepatosplenomegaly. There is no tenderness.  Musculoskeletal: He exhibits no edema.  Lymphadenopathy:    He has no cervical adenopathy.  Neurological: He is alert and oriented to person, place, and time.  Skin: Skin is warm and dry.  Psychiatric: He has a normal mood and affect. His behavior is normal.  Vitals reviewed.   Results for orders placed or performed in visit on 01/10/16  COMPLETE METABOLIC PANEL WITH GFR  Result Value Ref Range   Sodium 137 135 - 146 mmol/L   Potassium 4.7 3.5 - 5.3 mmol/L   Chloride 104 98 - 110 mmol/L  CO2 26 20 - 31 mmol/L   Glucose, Bld 97 65 - 99 mg/dL   BUN 10 7 - 25 mg/dL   Creat 1.21 0.70 - 1.33 mg/dL   Total Bilirubin 0.4 0.2 - 1.2 mg/dL   Alkaline Phosphatase 72 40 - 115 U/L   AST 30 10 - 35 U/L   ALT 27 9 - 46 U/L   Total Protein 7.2 6.1 - 8.1 g/dL   Albumin 3.8 3.6 - 5.1 g/dL   Calcium 9.0 8.6 - 10.3 mg/dL   GFR, Est African American 80 >=60 mL/min   GFR, Est Non African  American 69 >=60 mL/min  Lipid Profile  Result Value Ref Range   Cholesterol 174 125 - 200 mg/dL   Triglycerides 58 <150 mg/dL   HDL 79 >=40 mg/dL   Total CHOL/HDL Ratio 2.2 <=5.0 Ratio   VLDL 12 <30 mg/dL   LDL Cholesterol 83 <130 mg/dL      Assessment & Plan:   Encounter Diagnoses  Name Primary?  . Essential hypertension, benign Yes  . Hyperlipidemia   . Cigarette nicotine dependence, uncomplicated     -reviewed labs with pt -continue current medications -counseled smoking cessation - f/u 4 months.  RTO sooner prn

## 2016-01-31 ENCOUNTER — Ambulatory Visit: Payer: Self-pay

## 2016-05-09 ENCOUNTER — Other Ambulatory Visit: Payer: Self-pay

## 2016-05-09 DIAGNOSIS — E785 Hyperlipidemia, unspecified: Secondary | ICD-10-CM

## 2016-05-09 DIAGNOSIS — I10 Essential (primary) hypertension: Secondary | ICD-10-CM

## 2016-05-11 LAB — LIPID PANEL
Cholesterol: 166 mg/dL (ref <200)
HDL: 50 mg/dL (ref >40)
LDL Cholesterol: 103 mg/dL — ABNORMAL HIGH (ref <100)
TRIGLYCERIDES: 63 mg/dL (ref <150)
Total CHOL/HDL Ratio: 3.3 Ratio (ref <5.0)
VLDL: 13 mg/dL (ref <30)

## 2016-05-11 LAB — COMPREHENSIVE METABOLIC PANEL
ALBUMIN: 3.3 g/dL — AB (ref 3.6–5.1)
ALK PHOS: 56 U/L (ref 40–115)
ALT: 8 U/L — AB (ref 9–46)
AST: 18 U/L (ref 10–35)
BUN: 12 mg/dL (ref 7–25)
CO2: 22 mmol/L (ref 20–31)
CREATININE: 1.03 mg/dL (ref 0.70–1.33)
Calcium: 8.5 mg/dL — ABNORMAL LOW (ref 8.6–10.3)
Chloride: 109 mmol/L (ref 98–110)
Glucose, Bld: 100 mg/dL — ABNORMAL HIGH (ref 65–99)
Potassium: 4.3 mmol/L (ref 3.5–5.3)
SODIUM: 136 mmol/L (ref 135–146)
TOTAL PROTEIN: 6.7 g/dL (ref 6.1–8.1)
Total Bilirubin: 0.4 mg/dL (ref 0.2–1.2)

## 2016-05-17 ENCOUNTER — Ambulatory Visit: Payer: Self-pay | Admitting: Physician Assistant

## 2016-05-23 ENCOUNTER — Ambulatory Visit: Payer: Self-pay | Admitting: Physician Assistant

## 2016-05-23 ENCOUNTER — Encounter: Payer: Self-pay | Admitting: Physician Assistant

## 2016-05-23 VITALS — BP 134/92 | HR 78 | Temp 97.7°F | Ht 71.25 in | Wt 179.2 lb

## 2016-05-23 DIAGNOSIS — E785 Hyperlipidemia, unspecified: Secondary | ICD-10-CM

## 2016-05-23 DIAGNOSIS — Z125 Encounter for screening for malignant neoplasm of prostate: Secondary | ICD-10-CM

## 2016-05-23 DIAGNOSIS — I10 Essential (primary) hypertension: Secondary | ICD-10-CM

## 2016-05-23 DIAGNOSIS — F17210 Nicotine dependence, cigarettes, uncomplicated: Secondary | ICD-10-CM

## 2016-05-23 MED ORDER — ATORVASTATIN CALCIUM 20 MG PO TABS: 20.0000 mg | ORAL_TABLET | Freq: Every day | ORAL | 2 refills | Status: DC

## 2016-05-23 MED ORDER — LISINOPRIL 10 MG PO TABS: 10.0000 mg | ORAL_TABLET | Freq: Every day | ORAL | 2 refills | Status: DC

## 2016-05-23 NOTE — Progress Notes (Signed)
BP (!) 134/92 (BP Location: Left Arm, Patient Position: Sitting, Cuff Size: Normal)   Pulse 78   Temp 97.7 F (36.5 C) (Other (Comment))   Ht 5' 11.25" (1.81 m)   Wt 179 lb 3.2 oz (81.3 kg)   SpO2 98%   BMI 24.82 kg/m    Subjective:    Patient ID: [name] [name], male    DOB: 08/01/1965, 51 y.o.   MRN: [id number]  HPI: [name] [name] is a 51 y.o. male presenting on 05/23/2016 for Hypertension and Hyperlipidemia   HPI  Pt out all meds for about a month.  He is still smoking.  He is doing well.   Relevant past medical, surgical, family and social history reviewed and updated as indicated. Interim medical history since our last visit reviewed. Allergies and medications reviewed and updated.   Current Outpatient Prescriptions:  .  atorvastatin (LIPITOR) 20 MG tablet, TAKE 1 Tablet BY MOUTH ONCE DAILY (Patient not taking: Reported on 05/23/2016), Disp: 90 tablet, Rfl: 2 .  lisinopril (PRINIVIL,ZESTRIL) 10 MG tablet, TAKE 1 Tablet BY MOUTH ONCE DAILY (Patient not taking: Reported on 05/23/2016), Disp: 90 tablet, Rfl: 2   Review of Systems  Constitutional: Negative for appetite change, chills, diaphoresis, fatigue, fever and unexpected weight change.  HENT: Negative for congestion, dental problem, drooling, ear pain, facial swelling, hearing loss, mouth sores, sneezing, sore throat, trouble swallowing and voice change.   Eyes: Negative for pain, discharge, redness, itching and visual disturbance.  Respiratory: Positive for wheezing. Negative for cough, choking and shortness of breath.   Cardiovascular: Negative for chest pain, palpitations and leg swelling.  Gastrointestinal: Negative for abdominal pain, blood in stool, constipation, diarrhea and vomiting.  Endocrine: Negative for cold intolerance, heat intolerance and polydipsia.  Genitourinary: Negative for decreased urine volume, dysuria and hematuria.  Musculoskeletal: Negative for arthralgias, back pain and gait problem.  Skin:  Negative for rash.  Allergic/Immunologic: Negative for environmental allergies.  Neurological: Negative for seizures, syncope, light-headedness and headaches.  Hematological: Negative for adenopathy.  Psychiatric/Behavioral: Negative for agitation, dysphoric mood and suicidal ideas. The patient is not nervous/anxious.     Per HPI unless specifically indicated above     Objective:    BP (!) 134/92 (BP Location: Left Arm, Patient Position: Sitting, Cuff Size: Normal)   Pulse 78   Temp 97.7 F (36.5 C) (Other (Comment))   Ht 5' 11.25" (1.81 m)   Wt 179 lb 3.2 oz (81.3 kg)   SpO2 98%   BMI 24.82 kg/m   Wt Readings from Last 3 Encounters:  05/23/16 179 lb 3.2 oz (81.3 kg)  01/19/16 171 lb (77.6 kg)  10/19/15 171 lb (77.6 kg)    Physical Exam  Constitutional: He is oriented to person, place, and time. He appears well-developed and well-nourished.  HENT:  Head: Normocephalic and atraumatic.  Neck: Neck supple.  Cardiovascular: Normal rate and regular rhythm.   Pulmonary/Chest: Effort normal and breath sounds normal. He has no wheezes.  Abdominal: Soft. Bowel sounds are normal. There is no hepatosplenomegaly. There is no tenderness.  Musculoskeletal: He exhibits no edema.  Lymphadenopathy:    He has no cervical adenopathy.  Neurological: He is alert and oriented to person, place, and time.  Skin: Skin is warm and dry.  Psychiatric: He has a normal mood and affect. His behavior is normal.  Vitals reviewed.   Results for orders placed or performed in visit on 05/09/16  Comprehensive Metabolic Panel (CMET)  Result Value Ref Range  Sodium 136 135 - 146 mmol/L   Potassium 4.3 3.5 - 5.3 mmol/L   Chloride 109 98 - 110 mmol/L   CO2 22 20 - 31 mmol/L   Glucose, Bld 100 (H) 65 - 99 mg/dL   BUN 12 7 - 25 mg/dL   Creat 1.03 0.70 - 1.33 mg/dL   Total Bilirubin 0.4 0.2 - 1.2 mg/dL   Alkaline Phosphatase 56 40 - 115 U/L   AST 18 10 - 35 U/L   ALT 8 (L) 9 - 46 U/L   Total Protein  6.7 6.1 - 8.1 g/dL   Albumin 3.3 (L) 3.6 - 5.1 g/dL   Calcium 8.5 (L) 8.6 - 10.3 mg/dL  Lipid Profile  Result Value Ref Range   Cholesterol 166 <200 mg/dL   Triglycerides 63 <150 mg/dL   HDL 50 >40 mg/dL   Total CHOL/HDL Ratio 3.3 <5.0 Ratio   VLDL 13 <30 mg/dL   LDL Cholesterol 103 (H) <100 mg/dL      Assessment & Plan:   Encounter Diagnoses  Name Primary?  . Essential hypertension, benign Yes  . Hyperlipidemia, unspecified hyperlipidemia type   . Cigarette nicotine dependence, uncomplicated   . Screening for prostate cancer      -reviewed labs with pt -encouraged pt to avoid running out of his medication -he is re-newed with medassist and meds ordered -counseled smoking cessation -follow up 3 months.  RTO sooner prn

## 2016-08-15 ENCOUNTER — Other Ambulatory Visit: Payer: Self-pay

## 2016-08-15 DIAGNOSIS — I10 Essential (primary) hypertension: Secondary | ICD-10-CM

## 2016-08-15 DIAGNOSIS — Z125 Encounter for screening for malignant neoplasm of prostate: Secondary | ICD-10-CM

## 2016-08-15 DIAGNOSIS — E785 Hyperlipidemia, unspecified: Secondary | ICD-10-CM

## 2016-08-16 LAB — LIPID PANEL
Cholesterol: 156 mg/dL (ref <200)
HDL: 66 mg/dL (ref >40)
LDL CALC: 81 mg/dL (ref <100)
Total CHOL/HDL Ratio: 2.4 Ratio (ref <5.0)
Triglycerides: 47 mg/dL (ref <150)
VLDL: 9 mg/dL (ref <30)

## 2016-08-17 LAB — COMPREHENSIVE METABOLIC PANEL
ALBUMIN: 3.7 g/dL (ref 3.6–5.1)
ALT: 12 U/L (ref 9–46)
AST: 18 U/L (ref 10–35)
Alkaline Phosphatase: 61 U/L (ref 40–115)
BUN: 13 mg/dL (ref 7–25)
CALCIUM: 8.9 mg/dL (ref 8.6–10.3)
CHLORIDE: 104 mmol/L (ref 98–110)
CO2: 24 mmol/L (ref 20–31)
CREATININE: 1.12 mg/dL (ref 0.70–1.33)
Glucose, Bld: 90 mg/dL (ref 65–99)
Potassium: 4.7 mmol/L (ref 3.5–5.3)
SODIUM: 137 mmol/L (ref 135–146)
TOTAL PROTEIN: 6.9 g/dL (ref 6.1–8.1)
Total Bilirubin: 0.3 mg/dL (ref 0.2–1.2)

## 2016-08-17 LAB — PSA: PSA: 0.7 ng/mL (ref <=4.0)

## 2016-08-22 ENCOUNTER — Ambulatory Visit: Payer: Self-pay | Admitting: Physician Assistant

## 2016-08-22 ENCOUNTER — Encounter: Payer: Self-pay | Admitting: Physician Assistant

## 2016-08-22 ENCOUNTER — Other Ambulatory Visit: Payer: Self-pay | Admitting: Physician Assistant

## 2016-08-22 VITALS — BP 126/82 | HR 78 | Temp 97.7°F | Ht 71.25 in | Wt 184.5 lb

## 2016-08-22 DIAGNOSIS — F17219 Nicotine dependence, cigarettes, with unspecified nicotine-induced disorders: Secondary | ICD-10-CM

## 2016-08-22 DIAGNOSIS — E785 Hyperlipidemia, unspecified: Secondary | ICD-10-CM

## 2016-08-22 DIAGNOSIS — Z1211 Encounter for screening for malignant neoplasm of colon: Secondary | ICD-10-CM

## 2016-08-22 DIAGNOSIS — I10 Essential (primary) hypertension: Secondary | ICD-10-CM

## 2016-08-22 NOTE — Progress Notes (Signed)
BP 126/82 (BP Location: Left Arm, Patient Position: Sitting, Cuff Size: Normal)   Pulse 78   Temp 97.7 F (36.5 C) (Other (Comment))   Ht 5' 11.25" (1.81 m)   Wt 184 lb 8 oz (83.7 kg)   SpO2 99%   BMI 25.55 kg/m    Subjective:    Patient ID: [name] [name], male    DOB: September 29, 1965, 51 y.o.   MRN: [id number]  HPI: [name] [name] is a 51 y.o. male presenting on 08/22/2016 for Hyperlipidemia and Hypertension   HPI   Pt says he is doing well  Pt still smoking.  He gets sob some times but says it isn't bad  Relevant past medical, surgical, family and social history reviewed and updated as indicated. Interim medical history since our last visit reviewed. Allergies and medications reviewed and updated.   Current Outpatient Prescriptions:  .  atorvastatin (LIPITOR) 20 MG tablet, Take 1 tablet (20 mg total) by mouth daily., Disp: 90 tablet, Rfl: 2 .  lisinopril (PRINIVIL,ZESTRIL) 10 MG tablet, Take 1 tablet (10 mg total) by mouth daily., Disp: 90 tablet, Rfl: 2   Review of Systems  Constitutional: Negative for appetite change, chills, diaphoresis, fatigue, fever and unexpected weight change.  HENT: Negative for congestion, dental problem, drooling, ear pain, facial swelling, hearing loss, mouth sores, sneezing, sore throat, trouble swallowing and voice change.   Eyes: Negative for pain, discharge, redness, itching and visual disturbance.  Respiratory: Positive for wheezing. Negative for cough, choking and shortness of breath.   Cardiovascular: Negative for chest pain, palpitations and leg swelling.  Gastrointestinal: Negative for abdominal pain, blood in stool, constipation, diarrhea and vomiting.  Endocrine: Negative for cold intolerance, heat intolerance and polydipsia.  Genitourinary: Negative for decreased urine volume, dysuria and hematuria.  Musculoskeletal: Positive for back pain. Negative for arthralgias and gait problem.  Skin: Positive for rash.  Allergic/Immunologic:  Negative for environmental allergies.  Neurological: Negative for seizures, syncope, light-headedness and headaches.  Hematological: Negative for adenopathy.  Psychiatric/Behavioral: Negative for agitation, dysphoric mood and suicidal ideas. The patient is not nervous/anxious.     Per HPI unless specifically indicated above     Objective:    BP 126/82 (BP Location: Left Arm, Patient Position: Sitting, Cuff Size: Normal)   Pulse 78   Temp 97.7 F (36.5 C) (Other (Comment))   Ht 5' 11.25" (1.81 m)   Wt 184 lb 8 oz (83.7 kg)   SpO2 99%   BMI 25.55 kg/m   Wt Readings from Last 3 Encounters:  08/22/16 184 lb 8 oz (83.7 kg)  05/23/16 179 lb 3.2 oz (81.3 kg)  01/19/16 171 lb (77.6 kg)    Physical Exam  Constitutional: He is oriented to person, place, and time. He appears well-developed and well-nourished.  HENT:  Head: Normocephalic and atraumatic.  Neck: Neck supple.  Cardiovascular: Normal rate and regular rhythm.   Pulmonary/Chest: Effort normal and breath sounds normal. He has no wheezes.  Abdominal: Soft. Bowel sounds are normal. There is no hepatosplenomegaly. There is no tenderness.  Musculoskeletal: He exhibits no edema.  Lymphadenopathy:    He has no cervical adenopathy.  Neurological: He is alert and oriented to person, place, and time.  Skin: Skin is warm and dry.  Psychiatric: He has a normal mood and affect. His behavior is normal.  Vitals reviewed.   Results for orders placed or performed in visit on 08/15/16  PSA  Result Value Ref Range   PSA 0.7 <=4.0 ng/mL  Comprehensive metabolic panel  Result Value Ref Range   Sodium 137 135 - 146 mmol/L   Potassium 4.7 3.5 - 5.3 mmol/L   Chloride 104 98 - 110 mmol/L   CO2 24 20 - 31 mmol/L   Glucose, Bld 90 65 - 99 mg/dL   BUN 13 7 - 25 mg/dL   Creat 1.12 0.70 - 1.33 mg/dL   Total Bilirubin 0.3 0.2 - 1.2 mg/dL   Alkaline Phosphatase 61 40 - 115 U/L   AST 18 10 - 35 U/L   ALT 12 9 - 46 U/L   Total Protein 6.9 6.1  - 8.1 g/dL   Albumin 3.7 3.6 - 5.1 g/dL   Calcium 8.9 8.6 - 10.3 mg/dL  Lipid panel  Result Value Ref Range   Cholesterol 156 <200 mg/dL   Triglycerides 47 <150 mg/dL   HDL 66 >40 mg/dL   Total CHOL/HDL Ratio 2.4 <5.0 Ratio   VLDL 9 <30 mg/dL   LDL Cholesterol 81 <100 mg/dL      Assessment & Plan:   Encounter Diagnoses  Name Primary?  . Essential hypertension, benign Yes  . Hyperlipidemia, unspecified hyperlipidemia type   . Cigarette nicotine dependence with nicotine-induced disorder   . Special screening for malignant neoplasms, colon      -reviewed labs with pt -pt to continue current medications -pt counseled on smoking cessation -Pt given iFOBT for colon cancer screening -follow up 3 months.  RTO sooner prn

## 2016-08-28 LAB — IFOBT (OCCULT BLOOD): IFOBT: POSITIVE

## 2016-10-17 ENCOUNTER — Other Ambulatory Visit: Payer: Self-pay | Admitting: Physician Assistant

## 2016-10-17 DIAGNOSIS — I10 Essential (primary) hypertension: Secondary | ICD-10-CM

## 2016-10-17 DIAGNOSIS — E785 Hyperlipidemia, unspecified: Secondary | ICD-10-CM

## 2016-11-13 ENCOUNTER — Other Ambulatory Visit (HOSPITAL_COMMUNITY)
Admission: RE | Admit: 2016-11-13 | Discharge: 2016-11-13 | Disposition: A | Discharge status: Home / Self Care | Payer: Self-pay | Source: Ambulatory Visit | Attending: Physician Assistant | Admitting: Physician Assistant

## 2016-11-13 DIAGNOSIS — E785 Hyperlipidemia, unspecified: Secondary | ICD-10-CM | POA: Insufficient documentation

## 2016-11-13 DIAGNOSIS — I10 Essential (primary) hypertension: Secondary | ICD-10-CM | POA: Insufficient documentation

## 2016-11-13 LAB — COMPREHENSIVE METABOLIC PANEL
ALT: 37 U/L (ref 17–63)
AST: 66 U/L — AB (ref 15–41)
Albumin: 3.8 g/dL (ref 3.5–5.0)
Alkaline Phosphatase: 63 U/L (ref 38–126)
Anion gap: 10 (ref 5–15)
BILIRUBIN TOTAL: 0.8 mg/dL (ref 0.3–1.2)
BUN: 8 mg/dL (ref 6–20)
CO2: 23 mmol/L (ref 22–32)
CREATININE: 1 mg/dL (ref 0.61–1.24)
Calcium: 9.1 mg/dL (ref 8.9–10.3)
Chloride: 103 mmol/L (ref 101–111)
GFR calc Af Amer: >60 mL/min (ref >60)
GFR calc non Af Amer: >60 mL/min (ref >60)
Glucose, Bld: 82 mg/dL (ref 65–99)
Potassium: 4.3 mmol/L (ref 3.5–5.1)
Sodium: 136 mmol/L (ref 135–145)
TOTAL PROTEIN: 7.7 g/dL (ref 6.5–8.1)

## 2016-11-13 LAB — LIPID PANEL
CHOLESTEROL: 148 mg/dL (ref 0–200)
HDL: 75 mg/dL (ref >40)
LDL Cholesterol: 64 mg/dL (ref 0–99)
TRIGLYCERIDES: 43 mg/dL (ref <150)
Total CHOL/HDL Ratio: 2 RATIO
VLDL: 9 mg/dL (ref 0–40)

## 2016-11-21 ENCOUNTER — Encounter: Payer: Self-pay | Admitting: Physician Assistant

## 2016-11-21 ENCOUNTER — Ambulatory Visit: Payer: Self-pay | Admitting: Physician Assistant

## 2016-11-21 VITALS — BP 132/88 | HR 63 | Temp 97.5°F | Ht 71.25 in | Wt 175.0 lb

## 2016-11-21 DIAGNOSIS — R195 Other fecal abnormalities: Secondary | ICD-10-CM

## 2016-11-21 DIAGNOSIS — J449 Chronic obstructive pulmonary disease, unspecified: Secondary | ICD-10-CM

## 2016-11-21 DIAGNOSIS — I10 Essential (primary) hypertension: Secondary | ICD-10-CM

## 2016-11-21 DIAGNOSIS — F17219 Nicotine dependence, cigarettes, with unspecified nicotine-induced disorders: Secondary | ICD-10-CM

## 2016-11-21 DIAGNOSIS — E785 Hyperlipidemia, unspecified: Secondary | ICD-10-CM

## 2016-11-21 MED ORDER — ALBUTEROL SULFATE HFA 108 (90 BASE) MCG/ACT IN AERS: 2.0000 | INHALATION_SPRAY | Freq: Four times a day (QID) | RESPIRATORY_TRACT | 1 refills | Status: DC | PRN

## 2016-11-21 NOTE — Patient Instructions (Addendum)
Financial Counselor- [phone]   Steps to Quit Smoking Smoking tobacco can be harmful to your health and can affect almost every organ in your body. Smoking puts you, and those around you, at risk for developing many serious chronic diseases. Quitting smoking is difficult, but it is one of the best things that you can do for your health. It is never too late to quit. What are the benefits of quitting smoking? When you quit smoking, you lower your risk of developing serious diseases and conditions, such as:  Lung cancer or lung disease, such as COPD.  Heart disease.  Stroke.  Heart attack.  Infertility.  Osteoporosis and bone fractures.  Additionally, symptoms such as coughing, wheezing, and shortness of breath may get better when you quit. You may also find that you get sick less often because your body is stronger at fighting off colds and infections. If you are pregnant, quitting smoking can help to reduce your chances of having a baby of low birth weight. How do I get ready to quit? When you decide to quit smoking, create a plan to make sure that you are successful. Before you quit:  Pick a date to quit. Set a date within the next two weeks to give you time to prepare.  Write down the reasons why you are quitting. Keep this list in places where you will see it often, such as on your bathroom mirror or in your car or wallet.  Identify the people, places, things, and activities that make you want to smoke (triggers) and avoid them. Make sure to take these actions: ? Throw away all cigarettes at home, at work, and in your car. ? Throw away smoking accessories, such as Scientist, research (medical). ? Clean your car and make sure to empty the ashtray. ? Clean your home, including curtains and carpets.  Tell your family, friends, and coworkers that you are quitting. Support from your loved ones can make quitting easier.  Talk with your health care provider about your options for quitting  smoking.  Find out what treatment options are covered by your health insurance.  What strategies can I use to quit smoking? Talk with your healthcare provider about different strategies to quit smoking. Some strategies include:  Quitting smoking altogether instead of gradually lessening how much you smoke over a period of time. Research shows that quitting "cold [place]" is more successful than gradually quitting.  Attending in-person counseling to help you build problem-solving skills. You are more likely to have success in quitting if you attend several counseling sessions. Even short sessions of 10 minutes can be effective.  Finding resources and support systems that can help you to quit smoking and remain smoke-free after you quit. These resources are most helpful when you use them often. They can include: ? Online chats with a Social worker. ? Telephone quitlines. ? Careers information officer. ? Support groups or group counseling. ? Text messaging programs. ? Mobile phone applications.  Taking medicines to help you quit smoking. (If you are pregnant or breastfeeding, talk with your health care provider first.) Some medicines contain nicotine and some do not. Both types of medicines help with cravings, but the medicines that include nicotine help to relieve withdrawal symptoms. Your health care provider may recommend: ? Nicotine patches, gum, or lozenges. ? Nicotine inhalers or sprays. ? Non-nicotine medicine that is taken by mouth.  Talk with your health care provider about combining strategies, such as taking medicines while you are also receiving in-person counseling.  Using these two strategies together makes you more likely to succeed in quitting than if you used either strategy on its own. If you are pregnant or breastfeeding, talk with your health care provider about finding counseling or other support strategies to quit smoking. Do not take medicine to help you quit smoking unless  told to do so by your health care provider. What things can I do to make it easier to quit? Quitting smoking might feel overwhelming at first, but there is a lot that you can do to make it easier. Take these important actions:  Reach out to your family and friends and ask that they support and encourage you during this time. Call telephone quitlines, reach out to support groups, or work with a counselor for support.  Ask people who smoke to avoid smoking around you.  Avoid places that trigger you to smoke, such as bars, parties, or smoke-break areas at work.  Spend time around people who do not smoke.  Lessen stress in your life, because stress can be a smoking trigger for some people. To lessen stress, try: ? Exercising regularly. ? Deep-breathing exercises. ? Yoga. ? Meditating. ? Performing a body scan. This involves closing your eyes, scanning your body from head to toe, and noticing which parts of your body are particularly tense. Purposefully relax the muscles in those areas.  Download or purchase mobile phone or tablet apps (applications) that can help you stick to your quit plan by providing reminders, tips, and encouragement. There are many free apps, such as QuitGuide from the [employer] Office manager for Disease Control and Prevention). You can find other support for quitting smoking (smoking cessation) through smokefree.gov and other websites.  How will I feel when I quit smoking? Within the first 24 hours of quitting smoking, you may start to feel some withdrawal symptoms. These symptoms are usually most noticeable 2-3 days after quitting, but they usually do not last beyond 2-3 weeks. Changes or symptoms that you might experience include:  Mood swings.  Restlessness, anxiety, or irritation.  Difficulty concentrating.  Dizziness.  Strong cravings for sugary foods in addition to nicotine.  Mild weight gain.  Constipation.  Nausea.  Coughing or a sore throat.  Changes in how  your medicines work in your body.  A depressed mood.  Difficulty sleeping (insomnia).  After the first 2-3 weeks of quitting, you may start to notice more positive results, such as:  Improved sense of smell and taste.  Decreased coughing and sore throat.  Slower heart rate.  Lower blood pressure.  Clearer skin.  The ability to breathe more easily.  Fewer sick days.  Quitting smoking is very challenging for most people. Do not get discouraged if you are not successful the first time. Some people need to make many attempts to quit before they achieve long-term success. Do your best to stick to your quit plan, and talk with your health care provider if you have any questions or concerns. This information is not intended to replace advice given to you by your health care provider. Make sure you discuss any questions you have with your health care provider. Document Released: 04/11/2001 Document Revised: 12/14/2015 Document Reviewed: 09/01/2014 Elsevier Interactive Patient Education  2017 [employer].

## 2016-11-21 NOTE — Progress Notes (Signed)
BP 132/88 (BP Location: Left Arm, Patient Position: Sitting, Cuff Size: Normal)   Pulse 63   Temp (!) 97.5 F (36.4 C) (Other (Comment))   Ht 5' 11.25" (1.81 m)   Wt 175 lb (79.4 kg)   SpO2 99%   BMI 24.24 kg/m    Subjective:    Patient ID: [name] [name], male    DOB: May 02, 1965, 51 y.o.   MRN: [id number]  HPI: [name] [name] is a 51 y.o. male presenting on 11/21/2016 for Hyperlipidemia and Hypertension   HPI   Pt turned in [facility] application for GI referral for + iFOBT  He is doing well.  He is still smoking.  He does have some wheezing at times.    Relevant past medical, surgical, family and social history reviewed and updated as indicated. Interim medical history since our last visit reviewed. Allergies and medications reviewed and updated.   Current Outpatient Prescriptions:  .  atorvastatin (LIPITOR) 20 MG tablet, Take 1 tablet (20 mg total) by mouth daily., Disp: 90 tablet, Rfl: 2 .  lisinopril (PRINIVIL,ZESTRIL) 10 MG tablet, Take 1 tablet (10 mg total) by mouth daily., Disp: 90 tablet, Rfl: 2  Review of Systems  Constitutional: Negative for appetite change, chills, diaphoresis, fatigue, fever and unexpected weight change.  HENT: Negative for congestion, drooling, ear pain, facial swelling, hearing loss, mouth sores, sneezing, sore throat, trouble swallowing and voice change.   Eyes: Positive for visual disturbance. Negative for pain, discharge, redness and itching.  Respiratory: Positive for wheezing. Negative for cough, choking and shortness of breath.   Cardiovascular: Negative for chest pain, palpitations and leg swelling.  Gastrointestinal: Negative for abdominal pain, blood in stool, constipation, diarrhea and vomiting.  Endocrine: Negative for cold intolerance, heat intolerance and polydipsia.  Genitourinary: Negative for decreased urine volume, dysuria and hematuria.  Musculoskeletal: Positive for arthralgias and back pain. Negative for gait problem.   Skin: Negative for rash.  Allergic/Immunologic: Negative for environmental allergies.  Neurological: Negative for seizures, syncope, light-headedness and headaches.  Hematological: Negative for adenopathy.  Psychiatric/Behavioral: Negative for agitation, dysphoric mood and suicidal ideas. The patient is not nervous/anxious.     Per HPI unless specifically indicated above     Objective:    BP 132/88 (BP Location: Left Arm, Patient Position: Sitting, Cuff Size: Normal)   Pulse 63   Temp (!) 97.5 F (36.4 C) (Other (Comment))   Ht 5' 11.25" (1.81 m)   Wt 175 lb (79.4 kg)   SpO2 99%   BMI 24.24 kg/m   Wt Readings from Last 3 Encounters:  11/21/16 175 lb (79.4 kg)  08/22/16 184 lb 8 oz (83.7 kg)  05/23/16 179 lb 3.2 oz (81.3 kg)    Physical Exam  Constitutional: He is oriented to person, place, and time. He appears well-developed and well-nourished.  HENT:  Head: Normocephalic and atraumatic.  Neck: Neck supple.  Cardiovascular: Normal rate and regular rhythm.   Pulmonary/Chest: Effort normal. No accessory muscle usage. No tachypnea. No respiratory distress. He has wheezes. He has no rhonchi. He has no rales.  Abdominal: Soft. Bowel sounds are normal. There is no hepatosplenomegaly. There is no tenderness.  Musculoskeletal: He exhibits no edema.  Lymphadenopathy:    He has no cervical adenopathy.  Neurological: He is alert and oriented to person, place, and time.  Skin: Skin is warm and dry.  Psychiatric: He has a normal mood and affect. His behavior is normal.  Vitals reviewed.   Results for orders placed  or performed during the hospital encounter of 11/13/16  Lipid panel  Result Value Ref Range   Cholesterol 148 0 - 200 mg/dL   Triglycerides 43 <150 mg/dL   HDL 75 >40 mg/dL   Total CHOL/HDL Ratio 2.0 RATIO   VLDL 9 0 - 40 mg/dL   LDL Cholesterol 64 0 - 99 mg/dL  Comprehensive metabolic panel  Result Value Ref Range   Sodium 136 135 - 145 mmol/L   Potassium 4.3  3.5 - 5.1 mmol/L   Chloride 103 101 - 111 mmol/L   CO2 23 22 - 32 mmol/L   Glucose, Bld 82 65 - 99 mg/dL   BUN 8 6 - 20 mg/dL   Creatinine, Ser 1.00 0.61 - 1.24 mg/dL   Calcium 9.1 8.9 - 10.3 mg/dL   Total Protein 7.7 6.5 - 8.1 g/dL   Albumin 3.8 3.5 - 5.0 g/dL   AST 66 (H) 15 - 41 U/L   ALT 37 17 - 63 U/L   Alkaline Phosphatase 63 38 - 126 U/L   Total Bilirubin 0.8 0.3 - 1.2 mg/dL   GFR calc non Af Amer >60 >60 mL/min   GFR calc Af Amer >60 >60 mL/min   Anion gap 10 5 - 15      Assessment & Plan:   Encounter Diagnoses  Name Primary?  . Essential hypertension, benign Yes  . Hyperlipidemia, unspecified hyperlipidemia type   . Positive fecal occult blood test   . Cigarette nicotine dependence with nicotine-induced disorder   . Chronic obstructive pulmonary disease, unspecified COPD type ([place])     -reviewed labs with pt  -Enter referral to GI  for + iFOBT -rx albuterol inhaler for wheezing. Counseled smoking cessation and gave handout.  Pt to bring inhaler in to office to get assistance with proper use/technique when it arrives in the mail  -pt to continue current statin and lisinopril -follow up 3 months. No labs before next OV.   RTO sooner prn

## 2016-12-04 ENCOUNTER — Encounter: Payer: Self-pay | Admitting: Internal Medicine

## 2017-01-22 ENCOUNTER — Ambulatory Visit (INDEPENDENT_AMBULATORY_CARE_PROVIDER_SITE_OTHER): Payer: Self-pay | Admitting: Gastroenterology

## 2017-01-22 ENCOUNTER — Encounter: Payer: Self-pay | Admitting: Gastroenterology

## 2017-01-22 ENCOUNTER — Other Ambulatory Visit: Payer: Self-pay

## 2017-01-22 VITALS — BP 138/90 | HR 84 | Temp 97.9°F | Ht 72.0 in | Wt 168.8 lb

## 2017-01-22 DIAGNOSIS — R7989 Other specified abnormal findings of blood chemistry: Secondary | ICD-10-CM

## 2017-01-22 DIAGNOSIS — K921 Melena: Secondary | ICD-10-CM

## 2017-01-22 DIAGNOSIS — R195 Other fecal abnormalities: Secondary | ICD-10-CM

## 2017-01-22 DIAGNOSIS — R945 Abnormal results of liver function studies: Secondary | ICD-10-CM

## 2017-01-22 MED ORDER — PEG 3350-KCL-NA BICARB-NACL 420 G PO SOLR: 4000.0000 mL | ORAL | 0 refills | Status: DC

## 2017-01-22 NOTE — Assessment & Plan Note (Signed)
Heme positive stool. Intermittent rectal pain and bleeding he feels is hemorrhoids. No other gi symptoms. Plan for colonoscopy with deep sedation in OR with Dr. Gala Romney.  I have discussed the risks, alternatives, benefits with regards to but not limited to the risk of reaction to medication, bleeding, infection, perforation and the patient is agreeable to proceed. Written consent to be obtained.

## 2017-01-22 NOTE — Progress Notes (Signed)
Primary Care Physician:  Soyla Dryer, PA-C  Primary Gastroenterologist:  Garfield Cornea, MD   Chief Complaint  Patient presents with  . heme positive stool     never had tcs    HPI:  [name] [name] is a 51 y.o. male here for further evaluation of heme positive stool at request of Soyla Dryer, PA-C. Patient also has elevated LFTs (transaminases) intermittent over past couple of years.   Denies abd pain, vomiting, heartburn, dysphagia, weight loss. No constipation, diarrhea, melena. Complains of intermittent rectal pain and bleeding from suspected hemorrhoids.   Prior h/o remote etoh abuse. No etoh in four years. Denies h/o illicit drug use.   Current Outpatient Prescriptions  Medication Sig Dispense Refill  . albuterol (PROVENTIL HFA;VENTOLIN HFA) 108 (90 Base) MCG/ACT inhaler Inhale 2 puffs into the lungs every 6 (six) hours as needed for wheezing or shortness of breath. 3 Inhaler 1  . atorvastatin (LIPITOR) 20 MG tablet Take 1 tablet (20 mg total) by mouth daily. 90 tablet 2  . lisinopril (PRINIVIL,ZESTRIL) 10 MG tablet Take 1 tablet (10 mg total) by mouth daily. 90 tablet 2   No current facility-administered medications for this visit.     Allergies as of 01/22/2017  . (No Known Allergies)    Past Medical History:  Diagnosis Date  . COPD (chronic obstructive pulmonary disease) ([place])   . High cholesterol   . Hypertension   . Kidney stones     Past Surgical History:  Procedure Laterality Date  . APPENDECTOMY    . FRACTURE SURGERY     left arm    Family History  Problem Relation Age of Onset  . Cancer Father        throat  . Diabetes Sister   . Colon cancer Neg Hx     Social History   Social History  . Marital status: Single    Spouse name: N/A  . Number of children: N/A  . Years of education: N/A   Occupational History  . Not on file.   Social History Main Topics  . Smoking status: Current Every Day Smoker    Packs/day: 0.50    Years: 33.00     Types: Cigarettes  . Smokeless tobacco: Never Used  . Alcohol use No     Comment: no etoh since 2014. remote heavy user.   . Drug use: No  . Sexual activity: Yes    Birth control/ protection: None   Other Topics Concern  . Not on file   Social History Narrative  . No narrative on file      ROS:  General: Negative for anorexia, weight loss, fever, chills, fatigue, weakness. Eyes: Negative for vision changes.  ENT: Negative for hoarseness, difficulty swallowing , nasal congestion. CV: Negative for chest pain, angina, palpitations, dyspnea on exertion, peripheral edema.  Respiratory: Negative for dyspnea at rest, dyspnea on exertion, cough, sputum, +wheezing.  GI: See history of present illness. GU:  Negative for dysuria, hematuria, urinary incontinence, urinary frequency, nocturnal urination.  MS: Negative for joint pain, low back pain.  Derm: Negative for rash or itching.  Neuro: Negative for weakness, abnormal sensation, seizure, frequent headaches, memory loss, confusion.  Psych: Negative for anxiety, depression, suicidal ideation, hallucinations.  Endo: Negative for unusual weight change.  Heme: Negative for bruising or bleeding. Allergy: Negative for rash or hives.    Physical Examination:  BP 138/90   Pulse 84   Temp 97.9 F (36.6 C) (Oral)   Ht 6' (1.829  m)   Wt 168 lb 12.8 oz (76.6 kg)   BMI 22.89 kg/m    General: Well-nourished, well-developed in no acute distress.  Head: Normocephalic, atraumatic.   Eyes: Conjunctiva pink, no icterus. Mouth: Oropharyngeal mucosa moist and pink , no lesions erythema or exudate. Neck: Supple without thyromegaly, masses, or lymphadenopathy.  Lungs: Clear to auscultation bilaterally.  Heart: Regular rate and rhythm, no murmurs rubs or gallops.  Abdomen: Bowel sounds are normal, nontender, nondistended, no hepatosplenomegaly or masses, no abdominal bruits or    hernia , no rebound or guarding.   Rectal: not  performed Extremities: No lower extremity edema. No clubbing or deformities.  Neuro: Alert and oriented x 4 , grossly normal neurologically.  Skin: Warm and dry, no rash or jaundice.   Psych: Alert and cooperative, normal mood and affect.  Labs: Lab Results  Component Value Date   CREATININE 1.00 11/13/2016   BUN 8 11/13/2016   NA 136 11/13/2016   K 4.3 11/13/2016   CL 103 11/13/2016   CO2 23 11/13/2016   Lab Results  Component Value Date   ALT 37 11/13/2016   AST 66 (H) 11/13/2016   ALKPHOS 63 11/13/2016   BILITOT 0.8 11/13/2016      Imaging Studies: No results found.

## 2017-01-22 NOTE — Assessment & Plan Note (Signed)
Intermittent abnormal LFTs. Currently on statin. Prior heavy etoh use. Plan to check for Hep B and Hep C, iron overload and abd u/s.

## 2017-01-22 NOTE — Patient Instructions (Signed)
1. Colonoscopy as scheduled. See separate instructions.  2. Please have your labs done. We will schedule you are a liver ultrasound after your labs are done.

## 2017-01-29 ENCOUNTER — Ambulatory Visit: Payer: Self-pay | Admitting: Physician Assistant

## 2017-01-29 ENCOUNTER — Other Ambulatory Visit: Payer: Self-pay | Admitting: Physician Assistant

## 2017-01-29 DIAGNOSIS — R945 Abnormal results of liver function studies: Principal | ICD-10-CM

## 2017-01-29 DIAGNOSIS — R7989 Other specified abnormal findings of blood chemistry: Secondary | ICD-10-CM

## 2017-02-05 ENCOUNTER — Encounter: Payer: Self-pay | Admitting: General Practice

## 2017-02-05 ENCOUNTER — Other Ambulatory Visit (HOSPITAL_COMMUNITY)
Admission: RE | Admit: 2017-02-05 | Discharge: 2017-02-05 | Disposition: A | Discharge status: Home / Self Care | Payer: Self-pay | Source: Ambulatory Visit | Attending: Physician Assistant | Admitting: Physician Assistant

## 2017-02-05 DIAGNOSIS — R945 Abnormal results of liver function studies: Secondary | ICD-10-CM | POA: Insufficient documentation

## 2017-02-05 DIAGNOSIS — R7989 Other specified abnormal findings of blood chemistry: Secondary | ICD-10-CM

## 2017-02-05 LAB — IRON AND TIBC
Iron: 136 ug/dL (ref 45–182)
Saturation Ratios: 57 % — ABNORMAL HIGH (ref 17.9–39.5)
TIBC: 239 ug/dL — ABNORMAL LOW (ref 250–450)
UIBC: 103 ug/dL

## 2017-02-05 LAB — FERRITIN: Ferritin: 308 ng/mL (ref 24–336)

## 2017-02-06 LAB — HEPATITIS B SURFACE ANTIGEN: HEP B S AG: NEGATIVE

## 2017-02-06 LAB — HEPATITIS C ANTIBODY

## 2017-02-15 ENCOUNTER — Other Ambulatory Visit (HOSPITAL_COMMUNITY): Payer: Self-pay

## 2017-02-19 NOTE — Progress Notes (Signed)
Iron sat slightly elevated with high normal ferritin. Hep B and C are NEGATIVE. Repeat iron/tibc, ferritin FASTING in 2 months.  Recommend abd u/s for abnormal lfts.

## 2017-02-21 ENCOUNTER — Encounter: Payer: Self-pay | Admitting: Physician Assistant

## 2017-02-21 ENCOUNTER — Ambulatory Visit: Payer: Self-pay | Admitting: Physician Assistant

## 2017-02-21 VITALS — BP 128/84 | HR 73 | Temp 97.2°F | Ht 72.0 in | Wt 175.0 lb

## 2017-02-21 DIAGNOSIS — J449 Chronic obstructive pulmonary disease, unspecified: Secondary | ICD-10-CM

## 2017-02-21 DIAGNOSIS — F17219 Nicotine dependence, cigarettes, with unspecified nicotine-induced disorders: Secondary | ICD-10-CM

## 2017-02-21 DIAGNOSIS — R195 Other fecal abnormalities: Secondary | ICD-10-CM

## 2017-02-21 DIAGNOSIS — E785 Hyperlipidemia, unspecified: Secondary | ICD-10-CM

## 2017-02-21 DIAGNOSIS — I10 Essential (primary) hypertension: Secondary | ICD-10-CM

## 2017-02-21 NOTE — Progress Notes (Signed)
BP 128/84   Pulse 73   Temp (!) 97.2 F (36.2 C)   Ht 6' (1.829 m)   Wt 175 lb (79.4 kg)   SpO2 99%   BMI 23.73 kg/m    Subjective:    Patient ID: [name] [name], male    DOB: October 30, 1965, 51 y.o.   MRN: [id number]  HPI: [name] [name] is a 51 y.o. male presenting on 02/21/2017 for Hypertension and Hyperlipidemia   HPI   Pt went to GI and is scheduled for colonoscopy.  He is still smoking  He says he is doing well  Relevant past medical, surgical, family and social history reviewed and updated as indicated. Interim medical history since our last visit reviewed. Allergies and medications reviewed and updated.   Current Outpatient Prescriptions:  .  albuterol (PROVENTIL HFA;VENTOLIN HFA) 108 (90 Base) MCG/ACT inhaler, Inhale 2 puffs into the lungs every 6 (six) hours as needed for wheezing or shortness of breath., Disp: 3 Inhaler, Rfl: 1 .  atorvastatin (LIPITOR) 20 MG tablet, Take 1 tablet (20 mg total) by mouth daily., Disp: 90 tablet, Rfl: 2 .  lisinopril (PRINIVIL,ZESTRIL) 10 MG tablet, Take 1 tablet (10 mg total) by mouth daily., Disp: 90 tablet, Rfl: 2 .  polyethylene glycol-electrolytes (TRILYTE) 420 g solution, Take 4,000 mLs by mouth as directed. (Patient not taking: Reported on 02/21/2017), Disp: 4000 mL, Rfl: 0   Review of Systems  Constitutional: Negative for appetite change, chills, diaphoresis, fatigue, fever and unexpected weight change.  HENT: Negative for congestion, drooling, ear pain, facial swelling, hearing loss, mouth sores, sneezing, sore throat, trouble swallowing and voice change.   Eyes: Positive for visual disturbance. Negative for pain, discharge, redness and itching.  Respiratory: Positive for wheezing. Negative for cough, choking and shortness of breath.   Cardiovascular: Negative for chest pain, palpitations and leg swelling.  Gastrointestinal: Negative for abdominal pain, blood in stool, constipation, diarrhea and vomiting.  Endocrine: Negative  for cold intolerance, heat intolerance and polydipsia.  Genitourinary: Negative for decreased urine volume, dysuria and hematuria.  Musculoskeletal: Negative for arthralgias, back pain and gait problem.  Skin: Negative for rash.  Allergic/Immunologic: Negative for environmental allergies.  Neurological: Negative for seizures, syncope, light-headedness and headaches.  Hematological: Negative for adenopathy.  Psychiatric/Behavioral: Negative for agitation, dysphoric mood and suicidal ideas. The patient is not nervous/anxious.     Per HPI unless specifically indicated above     Objective:    BP 128/84   Pulse 73   Temp (!) 97.2 F (36.2 C)   Ht 6' (1.829 m)   Wt 175 lb (79.4 kg)   SpO2 99%   BMI 23.73 kg/m   Wt Readings from Last 3 Encounters:  02/21/17 175 lb (79.4 kg)  01/22/17 168 lb 12.8 oz (76.6 kg)  11/21/16 175 lb (79.4 kg)    Physical Exam  Constitutional: He is oriented to person, place, and time. He appears well-developed and well-nourished.  HENT:  Head: Normocephalic and atraumatic.  Neck: Neck supple.  Cardiovascular: Normal rate and regular rhythm.   Pulmonary/Chest: Effort normal and breath sounds normal. He has no wheezes.  Abdominal: Soft. Bowel sounds are normal. There is no hepatosplenomegaly. There is no tenderness.  Musculoskeletal: He exhibits no edema.  Lymphadenopathy:    He has no cervical adenopathy.  Neurological: He is alert and oriented to person, place, and time.  Skin: Skin is warm and dry.  Psychiatric: He has a normal mood and affect. His behavior is normal.  Vitals reviewed.       Assessment & Plan:    Encounter Diagnoses  Name Primary?  . Essential hypertension, benign Yes  . Hyperlipidemia, unspecified hyperlipidemia type   . Cigarette nicotine dependence with nicotine-induced disorder   . Chronic obstructive pulmonary disease, unspecified COPD type ([place])   . Positive fecal occult blood test    -No labs today- (checking q 6  mo) -pt counseled on smoking cessation -pt to continue current rx -continue with GI as scheduled -follow up 3 months with labs before appt

## 2017-02-26 ENCOUNTER — Other Ambulatory Visit: Payer: Self-pay

## 2017-02-26 DIAGNOSIS — R7989 Other specified abnormal findings of blood chemistry: Secondary | ICD-10-CM

## 2017-02-26 DIAGNOSIS — R945 Abnormal results of liver function studies: Secondary | ICD-10-CM

## 2017-02-26 NOTE — Patient Instructions (Signed)
[name] [name]  02/26/2017     @PREFPERIOPPHARMACY @   Your procedure is scheduled on  03/05/2017 .  Report to [facility] at  1200  P.M.  Call this number if you have problems the morning of surgery:  [phone]   Remember:  Do not eat food or drink liquids after midnight.  Take these medicines the morning of surgery with A SIP OF WATER  Lisinopril. Use your inhaler before you come.   Do not wear jewelry, make-up or nail polish.  Do not wear lotions, powders, or perfumes, or deoderant.  Do not shave 48 hours prior to surgery.  Men may shave face and neck.  Do not bring valuables to the hospital.  [facility] is not responsible for any belongings or valuables.  Contacts, dentures or bridgework may not be worn into surgery.  Leave your suitcase in the car.  After surgery it may be brought to your room.  For patients admitted to the hospital, discharge time will be determined by your treatment team.  Patients discharged the day of surgery will not be allowed to drive home.   Name and phone number of your driver:   family Special instructions:  Follow the diet and prep instructions given to you by Dr Roseanne Kaufman office.  Please read over the following fact sheets that you were given. Anesthesia Post-op Instructions and Care and Recovery After Surgery       Colonoscopy, Adult A colonoscopy is an exam to look at the entire large intestine. During the exam, a lubricated, bendable tube is inserted into the anus and then passed into the rectum, colon, and other parts of the large intestine. A colonoscopy is often done as a part of normal colorectal screening or in response to certain symptoms, such as anemia, persistent diarrhea, abdominal pain, and blood in the stool. The exam can help screen for and diagnose medical problems, including:  Tumors.  Polyps.  Inflammation.  Areas of bleeding.  Tell a health care provider about:  Any allergies you have.  All  medicines you are taking, including vitamins, herbs, eye drops, creams, and over-the-counter medicines.  Any problems you or family members have had with anesthetic medicines.  Any blood disorders you have.  Any surgeries you have had.  Any medical conditions you have.  Any problems you have had passing stool. What are the risks? Generally, this is a safe procedure. However, problems may occur, including:  Bleeding.  A tear in the intestine.  A reaction to medicines given during the exam.  Infection (rare).  What happens before the procedure? Eating and drinking restrictions Follow instructions from your health care provider about eating and drinking, which may include:  A few days before the procedure - follow a low-fiber diet. Avoid nuts, seeds, dried fruit, raw fruits, and vegetables.  1-3 days before the procedure - follow a clear liquid diet. Drink only clear liquids, such as clear broth or bouillon, black coffee or tea, clear juice, clear soft drinks or sports drinks, gelatin dessert, and popsicles. Avoid any liquids that contain red or purple dye.  On the day of the procedure - do not eat or drink anything during the 2 hours before the procedure, or within the time period that your health care provider recommends.  Bowel prep If you were prescribed an oral bowel prep to clean out your colon:  Take it as told by your health care provider. Starting the  day before your procedure, you will need to drink a large amount of medicated liquid. The liquid will cause you to have multiple loose stools until your stool is almost clear or light green.  If your skin or anus gets irritated from diarrhea, you may use these to relieve the irritation: ? Medicated wipes, such as adult wet wipes with aloe and vitamin E. ? A skin soothing-product like petroleum jelly.  If you vomit while drinking the bowel prep, take a break for up to 60 minutes and then begin the bowel prep again. If  vomiting continues and you cannot take the bowel prep without vomiting, call your health care provider.  General instructions  Ask your health care provider about changing or stopping your regular medicines. This is especially important if you are taking diabetes medicines or blood thinners.  Plan to have someone take you home from the hospital or clinic. What happens during the procedure?  An IV tube may be inserted into one of your veins.  You will be given medicine to help you relax (sedative).  To reduce your risk of infection: ? Your health care team will wash or sanitize their hands. ? Your anal area will be washed with soap.  You will be asked to lie on your side with your knees bent.  Your health care provider will lubricate a long, thin, flexible tube. The tube will have a camera and a light on the end.  The tube will be inserted into your anus.  The tube will be gently eased through your rectum and colon.  Air will be delivered into your colon to keep it open. You may feel some pressure or cramping.  The camera will be used to take images during the procedure.  A small tissue sample may be removed from your body to be examined under a microscope (biopsy). If any potential problems are found, the tissue will be sent to a lab for testing.  If small polyps are found, your health care provider may remove them and have them checked for cancer cells.  The tube that was inserted into your anus will be slowly removed. The procedure may vary among health care providers and hospitals. What happens after the procedure?  Your blood pressure, heart rate, breathing rate, and blood oxygen level will be monitored until the medicines you were given have worn off.  Do not drive for 24 hours after the exam.  You may have a small amount of blood in your stool.  You may pass gas and have mild abdominal cramping or bloating due to the air that was used to inflate your colon during the  exam.  It is up to you to get the results of your procedure. Ask your health care provider, or the department performing the procedure, when your results will be ready. This information is not intended to replace advice given to you by your health care provider. Make sure you discuss any questions you have with your health care provider. Document Released: 04/14/2000 Document Revised: 02/16/2016 Document Reviewed: 06/29/2015 Elsevier Interactive Patient Education  2018 [employer].  Colonoscopy, Adult, Care After This sheet gives you information about how to care for yourself after your procedure. Your health care provider may also give you more specific instructions. If you have problems or questions, contact your health care provider. What can I expect after the procedure? After the procedure, it is common to have:  A small amount of blood in your stool for 24 hours  after the procedure.  Some gas.  Mild abdominal cramping or bloating.  Follow these instructions at home: General instructions   For the first 24 hours after the procedure: ? Do not drive or use machinery. ? Do not sign important documents. ? Do not drink alcohol. ? Do your regular daily activities at a slower pace than normal. ? Eat soft, easy-to-digest foods. ? Rest often.  Take over-the-counter or prescription medicines only as told by your health care provider.  It is up to you to get the results of your procedure. Ask your health care provider, or the department performing the procedure, when your results will be ready. Relieving cramping and bloating  Try walking around when you have cramps or feel bloated.  Apply heat to your abdomen as told by your health care provider. Use a heat source that your health care provider recommends, such as a moist heat pack or a heating pad. ? Place a towel between your skin and the heat source. ? Leave the heat on for 20-30 minutes. ? Remove the heat if your skin turns  bright red. This is especially important if you are unable to feel pain, heat, or cold. You may have a greater risk of getting burned. Eating and drinking  Drink enough fluid to keep your urine clear or pale yellow.  Resume your normal diet as instructed by your health care provider. Avoid heavy or fried foods that are hard to digest.  Avoid drinking alcohol for as long as instructed by your health care provider. Contact a health care provider if:  You have blood in your stool 2-3 days after the procedure. Get help right away if:  You have more than a small spotting of blood in your stool.  You pass large blood clots in your stool.  Your abdomen is swollen.  You have nausea or vomiting.  You have a fever.  You have increasing abdominal pain that is not relieved with medicine. This information is not intended to replace advice given to you by your health care provider. Make sure you discuss any questions you have with your health care provider. Document Released: 11/30/2003 Document Revised: 01/10/2016 Document Reviewed: 06/29/2015 Elsevier Interactive Patient Education  2018 [place] Anesthesia is a term that refers to techniques, procedures, and medicines that help a person stay safe and comfortable during a medical procedure. Monitored anesthesia care, or sedation, is one type of anesthesia. Your anesthesia specialist may recommend sedation if you will be having a procedure that does not require you to be unconscious, such as:  Cataract surgery.  A dental procedure.  A biopsy.  A colonoscopy.  During the procedure, you may receive a medicine to help you relax (sedative). There are three levels of sedation:  Mild sedation. At this level, you may feel awake and relaxed. You will be able to follow directions.  Moderate sedation. At this level, you will be sleepy. You may not remember the procedure.  Deep sedation. At this level, you will be  asleep. You will not remember the procedure.  The more medicine you are given, the deeper your level of sedation will be. Depending on how you respond to the procedure, the anesthesia specialist may change your level of sedation or the type of anesthesia to fit your needs. An anesthesia specialist will monitor you closely during the procedure. Let your health care provider know about:  Any allergies you have.  All medicines you are taking, including vitamins, herbs,  eye drops, creams, and over-the-counter medicines.  Any use of steroids (by mouth or as a cream).  Any problems you or family members have had with sedatives and anesthetic medicines.  Any blood disorders you have.  Any surgeries you have had.  Any medical conditions you have, such as sleep apnea.  Whether you are pregnant or may be pregnant.  Any use of cigarettes, alcohol, or street drugs. What are the risks? Generally, this is a safe procedure. However, problems may occur, including:  Getting too much medicine (oversedation).  Nausea.  Allergic reaction to medicines.  Trouble breathing. If this happens, a breathing tube may be used to help with breathing. It will be removed when you are awake and breathing on your own.  Heart trouble.  Lung trouble.  Before the procedure Staying hydrated Follow instructions from your health care provider about hydration, which may include:  Up to 2 hours before the procedure - you may continue to drink clear liquids, such as water, clear fruit juice, black coffee, and plain tea.  Eating and drinking restrictions Follow instructions from your health care provider about eating and drinking, which may include:  8 hours before the procedure - stop eating heavy meals or foods such as meat, fried foods, or fatty foods.  6 hours before the procedure - stop eating light meals or foods, such as toast or cereal.  6 hours before the procedure - stop drinking milk or drinks that  contain milk.  2 hours before the procedure - stop drinking clear liquids.  Medicines Ask your health care provider about:  Changing or stopping your regular medicines. This is especially important if you are taking diabetes medicines or blood thinners.  Taking medicines such as aspirin and ibuprofen. These medicines can thin your blood. Do not take these medicines before your procedure if your health care provider instructs you not to.  Tests and exams  You will have a physical exam.  You may have blood tests done to show: ? How well your kidneys and liver are working. ? How well your blood can clot.  General instructions  Plan to have someone take you home from the hospital or clinic.  If you will be going home right after the procedure, plan to have someone with you for 24 hours.  What happens during the procedure?  Your blood pressure, heart rate, breathing, level of pain and overall condition will be monitored.  An IV tube will be inserted into one of your veins.  Your anesthesia specialist will give you medicines as needed to keep you comfortable during the procedure. This may mean changing the level of sedation.  The procedure will be performed. After the procedure  Your blood pressure, heart rate, breathing rate, and blood oxygen level will be monitored until the medicines you were given have worn off.  Do not drive for 24 hours if you received a sedative.  You may: ? Feel sleepy, clumsy, or nauseous. ? Feel forgetful about what happened after the procedure. ? Have a sore throat if you had a breathing tube during the procedure. ? Vomit. This information is not intended to replace advice given to you by your health care provider. Make sure you discuss any questions you have with your health care provider. Document Released: 01/11/2005 Document Revised: 09/24/2015 Document Reviewed: 08/08/2015 Elsevier Interactive Patient Education  2018 [place], Care After These instructions provide you with information about caring for yourself after your procedure. Your health  care provider may also give you more specific instructions. Your treatment has been planned according to current medical practices, but problems sometimes occur. Call your health care provider if you have any problems or questions after your procedure. What can I expect after the procedure? After your procedure, it is common to:  Feel sleepy for several hours.  Feel clumsy and have poor balance for several hours.  Feel forgetful about what happened after the procedure.  Have poor judgment for several hours.  Feel nauseous or vomit.  Have a sore throat if you had a breathing tube during the procedure.  Follow these instructions at home: For at least 24 hours after the procedure:   Do not: ? Participate in activities in which you could fall or become injured. ? Drive. ? Use heavy machinery. ? Drink alcohol. ? Take sleeping pills or medicines that cause drowsiness. ? Make important decisions or sign legal documents. ? Take care of children on your own.  Rest. Eating and drinking  Follow the diet that is recommended by your health care provider.  If you vomit, drink water, juice, or soup when you can drink without vomiting.  Make sure you have little or no nausea before eating solid foods. General instructions  Have a responsible adult stay with you until you are awake and alert.  Take over-the-counter and prescription medicines only as told by your health care provider.  If you smoke, do not smoke without supervision.  Keep all follow-up visits as told by your health care provider. This is important. Contact a health care provider if:  You keep feeling nauseous or you keep vomiting.  You feel light-headed.  You develop a rash.  You have a fever. Get help right away if:  You have trouble breathing. This information is not  intended to replace advice given to you by your health care provider. Make sure you discuss any questions you have with your health care provider. Document Released: 08/08/2015 Document Revised: 12/08/2015 Document Reviewed: 08/08/2015 Elsevier Interactive Patient Education  [employer].

## 2017-02-28 ENCOUNTER — Encounter (HOSPITAL_COMMUNITY)
Admission: RE | Admit: 2017-02-28 | Discharge: 2017-02-28 | Disposition: A | Discharge status: Home / Self Care | Payer: Self-pay | Source: Ambulatory Visit | Attending: Internal Medicine | Admitting: Internal Medicine

## 2017-02-28 ENCOUNTER — Encounter (HOSPITAL_COMMUNITY): Payer: Self-pay

## 2017-02-28 ENCOUNTER — Telehealth: Payer: Self-pay

## 2017-02-28 NOTE — Telephone Encounter (Signed)
Endo Scheduler called office. Pt was no show for his pre-op appt this morning. They were unable to contact him. Tried to call pt, no answer on mobile number, no voicemail. No answer at home number, LMOAM.

## 2017-03-01 ENCOUNTER — Encounter (HOSPITAL_COMMUNITY): Payer: Self-pay

## 2017-03-01 ENCOUNTER — Encounter (HOSPITAL_COMMUNITY)
Admission: RE | Admit: 2017-03-01 | Discharge: 2017-03-01 | Disposition: A | Discharge status: Home / Self Care | Payer: Self-pay | Source: Ambulatory Visit | Attending: Internal Medicine | Admitting: Internal Medicine

## 2017-03-01 DIAGNOSIS — I498 Other specified cardiac arrhythmias: Secondary | ICD-10-CM | POA: Insufficient documentation

## 2017-03-01 DIAGNOSIS — Z01810 Encounter for preprocedural cardiovascular examination: Secondary | ICD-10-CM | POA: Insufficient documentation

## 2017-03-01 DIAGNOSIS — I517 Cardiomegaly: Secondary | ICD-10-CM | POA: Insufficient documentation

## 2017-03-01 DIAGNOSIS — R195 Other fecal abnormalities: Secondary | ICD-10-CM | POA: Insufficient documentation

## 2017-03-01 DIAGNOSIS — Z01818 Encounter for other preprocedural examination: Secondary | ICD-10-CM | POA: Insufficient documentation

## 2017-03-01 HISTORY — DX: Personal history of urinary calculi: Z87.442

## 2017-03-01 LAB — CBC WITH DIFFERENTIAL/PLATELET
BASOS ABS: 0.1 10*3/uL (ref 0.0–0.1)
BASOS PCT: 1 %
EOS ABS: 0.2 10*3/uL (ref 0.0–0.7)
EOS PCT: 2 %
HCT: 46 % (ref 39.0–52.0)
Hemoglobin: 15.5 g/dL (ref 13.0–17.0)
Lymphocytes Relative: 40 %
Lymphs Abs: 3 10*3/uL (ref 0.7–4.0)
MCH: 29.9 pg (ref 26.0–34.0)
MCHC: 33.7 g/dL (ref 30.0–36.0)
MCV: 88.8 fL (ref 78.0–100.0)
MONO ABS: 0.9 10*3/uL (ref 0.1–1.0)
Monocytes Relative: 11 %
Neutro Abs: 3.5 10*3/uL (ref 1.7–7.7)
Neutrophils Relative %: 46 %
PLATELETS: 281 10*3/uL (ref 150–400)
RBC: 5.18 MIL/uL (ref 4.22–5.81)
RDW: 15.5 % (ref 11.5–15.5)
WBC: 7.5 10*3/uL (ref 4.0–10.5)

## 2017-03-01 LAB — COMPREHENSIVE METABOLIC PANEL
ALT: 13 U/L — ABNORMAL LOW (ref 17–63)
ANION GAP: 10 (ref 5–15)
AST: 21 U/L (ref 15–41)
Albumin: 3.8 g/dL (ref 3.5–5.0)
Alkaline Phosphatase: 62 U/L (ref 38–126)
BUN: 9 mg/dL (ref 6–20)
CALCIUM: 8.7 mg/dL — AB (ref 8.9–10.3)
CO2: 21 mmol/L — AB (ref 22–32)
CREATININE: 0.95 mg/dL (ref 0.61–1.24)
Chloride: 103 mmol/L (ref 101–111)
GFR calc non Af Amer: >60 mL/min (ref >60)
GLUCOSE: 91 mg/dL (ref 65–99)
Potassium: 3.7 mmol/L (ref 3.5–5.1)
SODIUM: 134 mmol/L — AB (ref 135–145)
TOTAL PROTEIN: 7.6 g/dL (ref 6.5–8.1)
Total Bilirubin: 0.5 mg/dL (ref 0.3–1.2)

## 2017-03-01 NOTE — Telephone Encounter (Signed)
Patient made aware of new pre-op appt today at 11:30, u/s on 11/7 and procedure on 11/5.

## 2017-03-02 ENCOUNTER — Encounter (HOSPITAL_COMMUNITY): Payer: Self-pay

## 2017-03-05 ENCOUNTER — Encounter (HOSPITAL_COMMUNITY): Payer: Self-pay | Admitting: Anesthesiology

## 2017-03-05 ENCOUNTER — Encounter (HOSPITAL_COMMUNITY)
Admission: RE | Disposition: A | Discharge status: Home / Self Care | Payer: Self-pay | Source: Ambulatory Visit | Attending: Internal Medicine

## 2017-03-05 ENCOUNTER — Encounter: Payer: Self-pay | Admitting: *Deleted

## 2017-03-05 ENCOUNTER — Ambulatory Visit (HOSPITAL_COMMUNITY)
Admission: RE | Admit: 2017-03-05 | Discharge: 2017-03-05 | Disposition: A | Discharge status: Home / Self Care | Payer: Self-pay | Source: Ambulatory Visit | Attending: Internal Medicine | Admitting: Internal Medicine

## 2017-03-05 ENCOUNTER — Other Ambulatory Visit: Payer: Self-pay | Admitting: *Deleted

## 2017-03-05 DIAGNOSIS — R195 Other fecal abnormalities: Secondary | ICD-10-CM

## 2017-03-05 SURGERY — COLONOSCOPY WITH PROPOFOL
Anesthesia: Monitor Anesthesia Care

## 2017-03-05 NOTE — Telephone Encounter (Signed)
Called spoke with pt and he has been given pre-op appt 04/12/17 at 11:00am. Appt details have been mailed to him as well. He verbalized understanding

## 2017-03-05 NOTE — Telephone Encounter (Addendum)
I received a call from Short Stay stating the patient did not drink his prep.  He has been rescheduled to 12/19 at 10:30 am  Hoyle Sauer will call back later with pre-operative case date and time.  Routing to [employer].

## 2017-03-05 NOTE — Progress Notes (Signed)
Patient arrived for scheduled Total Colonscopy, while reviewing medications and results of preparation, patient states he did not receive the trilyte solution for his prep. Patient verified that he took 3 ducolax tablets yesterday and completed his enema this am. Denies solid stool. Dr. Gala Romney notified, to cancel procedure today and reschedule appointment for this colonscopy. Dr. Roseanne Kaufman office called and instructed the patient to go by office to receive updated information and discuss preparation.

## 2017-03-05 NOTE — Telephone Encounter (Signed)
[name] [name] called and orders needs to be placed again before she can give pre-op date. I have placed orders.

## 2017-03-05 NOTE — Anesthesia Preprocedure Evaluation (Signed)
Anesthesia Evaluation  Patient identified by MRN, date of birth, ID band Patient awake    Airway        Dental   Pulmonary COPD, Current Smoker,           Cardiovascular Exercise Tolerance: Good hypertension, Pt. on medications      Neuro/Psych    GI/Hepatic   Endo/Other    Renal/GU Renal disease     Musculoskeletal   Abdominal   Peds  Hematology   Anesthesia Other Findings   Reproductive/Obstetrics                             Anesthesia Physical Anesthesia Plan Anesthesia Quick Evaluation

## 2017-03-05 NOTE — Telephone Encounter (Signed)
Patient came by the office and was given prep samples and instructions.

## 2017-03-07 ENCOUNTER — Ambulatory Visit (HOSPITAL_COMMUNITY)
Admission: RE | Admit: 2017-03-07 | Discharge: 2017-03-07 | Disposition: A | Discharge status: Home / Self Care | Payer: Self-pay | Source: Ambulatory Visit | Attending: Gastroenterology | Admitting: Gastroenterology

## 2017-03-07 DIAGNOSIS — R7989 Other specified abnormal findings of blood chemistry: Secondary | ICD-10-CM | POA: Insufficient documentation

## 2017-03-07 DIAGNOSIS — N270 Small kidney, unilateral: Secondary | ICD-10-CM | POA: Insufficient documentation

## 2017-03-07 DIAGNOSIS — K769 Liver disease, unspecified: Secondary | ICD-10-CM | POA: Insufficient documentation

## 2017-03-07 DIAGNOSIS — R945 Abnormal results of liver function studies: Secondary | ICD-10-CM

## 2017-03-08 NOTE — Progress Notes (Signed)
Please let patient know he has fatty liver, likely from prior etoh use. Recommend he continue to avoid etoh.  His left kidney is smaller than the right and shows some chronic renal disease. He needs to follow up with PCP to see if further evaluation needed.  Labs as planned in two months.     FYI to [employer].

## 2017-03-19 ENCOUNTER — Other Ambulatory Visit: Payer: Self-pay | Admitting: Physician Assistant

## 2017-03-26 ENCOUNTER — Other Ambulatory Visit: Payer: Self-pay | Admitting: Physician Assistant

## 2017-04-11 ENCOUNTER — Encounter (HOSPITAL_COMMUNITY): Payer: Self-pay

## 2017-04-12 ENCOUNTER — Encounter (HOSPITAL_COMMUNITY): Payer: Self-pay

## 2017-04-12 ENCOUNTER — Encounter (HOSPITAL_COMMUNITY)
Admission: RE | Admit: 2017-04-12 | Discharge: 2017-04-12 | Disposition: A | Discharge status: Home / Self Care | Payer: Self-pay | Source: Ambulatory Visit | Attending: Internal Medicine | Admitting: Internal Medicine

## 2017-04-12 HISTORY — DX: Dyspnea, unspecified: R06.00

## 2017-04-12 HISTORY — DX: Unspecified asthma, uncomplicated: J45.909

## 2017-04-18 ENCOUNTER — Encounter (HOSPITAL_COMMUNITY): Payer: Self-pay | Admitting: *Deleted

## 2017-04-18 ENCOUNTER — Ambulatory Visit (HOSPITAL_COMMUNITY)
Admission: RE | Admit: 2017-04-18 | Discharge: 2017-04-18 | Disposition: A | Discharge status: Home / Self Care | Payer: Self-pay | Source: Ambulatory Visit | Attending: Internal Medicine | Admitting: Internal Medicine

## 2017-04-18 ENCOUNTER — Encounter (HOSPITAL_COMMUNITY)
Admission: RE | Disposition: A | Discharge status: Home / Self Care | Payer: Self-pay | Source: Ambulatory Visit | Attending: Internal Medicine

## 2017-04-18 ENCOUNTER — Ambulatory Visit (HOSPITAL_COMMUNITY): Payer: Self-pay | Admitting: Anesthesiology

## 2017-04-18 DIAGNOSIS — R195 Other fecal abnormalities: Secondary | ICD-10-CM

## 2017-04-18 DIAGNOSIS — Z79899 Other long term (current) drug therapy: Secondary | ICD-10-CM | POA: Insufficient documentation

## 2017-04-18 DIAGNOSIS — E7800 Pure hypercholesterolemia, unspecified: Secondary | ICD-10-CM | POA: Insufficient documentation

## 2017-04-18 DIAGNOSIS — K5730 Diverticulosis of large intestine without perforation or abscess without bleeding: Secondary | ICD-10-CM | POA: Insufficient documentation

## 2017-04-18 DIAGNOSIS — D124 Benign neoplasm of descending colon: Secondary | ICD-10-CM | POA: Insufficient documentation

## 2017-04-18 DIAGNOSIS — I10 Essential (primary) hypertension: Secondary | ICD-10-CM | POA: Insufficient documentation

## 2017-04-18 DIAGNOSIS — Z87442 Personal history of urinary calculi: Secondary | ICD-10-CM | POA: Insufficient documentation

## 2017-04-18 DIAGNOSIS — F17210 Nicotine dependence, cigarettes, uncomplicated: Secondary | ICD-10-CM | POA: Insufficient documentation

## 2017-04-18 DIAGNOSIS — K640 First degree hemorrhoids: Secondary | ICD-10-CM | POA: Insufficient documentation

## 2017-04-18 DIAGNOSIS — D120 Benign neoplasm of cecum: Secondary | ICD-10-CM | POA: Insufficient documentation

## 2017-04-18 DIAGNOSIS — J45909 Unspecified asthma, uncomplicated: Secondary | ICD-10-CM | POA: Insufficient documentation

## 2017-04-18 HISTORY — PX: COLONOSCOPY WITH PROPOFOL: SHX5780

## 2017-04-18 HISTORY — PX: POLYPECTOMY: SHX5525

## 2017-04-18 SURGERY — COLONOSCOPY WITH PROPOFOL
Anesthesia: Monitor Anesthesia Care

## 2017-04-18 MED ORDER — PROPOFOL 10 MG/ML IV BOLUS
INTRAVENOUS | Status: AC
  Filled 2017-04-18: qty 40

## 2017-04-18 MED ORDER — LACTATED RINGERS IV SOLN
INTRAVENOUS | Status: DC
  Administered 2017-04-18: 09:00:00 via INTRAVENOUS

## 2017-04-18 MED ORDER — MIDAZOLAM HCL 2 MG/2ML IJ SOLN
1.0000 mg | INTRAMUSCULAR | Status: AC
  Administered 2017-04-18: 2 mg via INTRAVENOUS

## 2017-04-18 MED ORDER — MIDAZOLAM HCL 2 MG/2ML IJ SOLN
INTRAMUSCULAR | Status: AC
  Filled 2017-04-18: qty 2

## 2017-04-18 MED ORDER — FENTANYL CITRATE (PF) 100 MCG/2ML IJ SOLN
25.0000 ug | Freq: Once | INTRAMUSCULAR | Status: AC
  Administered 2017-04-18: 25 ug via INTRAVENOUS

## 2017-04-18 MED ORDER — PROPOFOL 10 MG/ML IV BOLUS
INTRAVENOUS | Status: DC | PRN
  Administered 2017-04-18 (×3): 20 mg via INTRAVENOUS

## 2017-04-18 MED ORDER — FENTANYL CITRATE (PF) 100 MCG/2ML IJ SOLN
INTRAMUSCULAR | Status: AC
  Filled 2017-04-18: qty 2

## 2017-04-18 MED ORDER — PROPOFOL 500 MG/50ML IV EMUL
INTRAVENOUS | Status: DC | PRN
  Administered 2017-04-18: 100 ug/kg/min via INTRAVENOUS
  Administered 2017-04-18: 75 ug/kg/min via INTRAVENOUS

## 2017-04-18 NOTE — Transfer of Care (Signed)
Immediate Anesthesia Transfer of Care Note  Patient: [name] [name]  Procedure(s) Performed: COLONOSCOPY WITH PROPOFOL (N/A ) POLYPECTOMY  Patient Location: PACU  Anesthesia Type:MAC  Level of Consciousness: awake, alert  and patient cooperative  Airway & Oxygen Therapy: Patient Spontanous Breathing  Post-op Assessment: Report given to RN and Post -op Vital signs reviewed and stable  Post vital signs: Reviewed and stable  Last Vitals:  Vitals:   04/18/17 0920 04/18/17 0925  BP: 108/79   Pulse:    Resp: 20 20  Temp:    SpO2: 98% 97%    Last Pain:  Vitals:   04/18/17 0850  TempSrc: Oral      Patients Stated Pain Goal: 5 (33/54/56 2563)  Complications: No apparent anesthesia complications

## 2017-04-18 NOTE — H&P (Signed)
@LOGO @   Primary Care Physician:  Soyla Dryer, PA-C Primary Gastroenterologist:  Dr. Gala Romney  Pre-Procedure History & Physical: HPI:  [name] [name] is a 51 y.o. male here for further evaluation of Hemoccult-positive stool. Has not seen any melena or gross blood per rectum.  Colonoscopy. No family history of colon cancer.  Past Medical History:  Diagnosis Date  . Asthma   . Dyspnea   . High cholesterol   . History of kidney stones   . Hypertension   . Kidney stones     Past Surgical History:  Procedure Laterality Date  . APPENDECTOMY    . FRACTURE SURGERY     left arm    Prior to Admission medications   Medication Sig Start Date End Date Taking? Authorizing Provider  acetaminophen (TYLENOL) 500 MG tablet Take 1,000-2,000 mg by mouth daily as needed for moderate pain or headache.    Yes [provider]  albuterol (PROVENTIL HFA;VENTOLIN HFA) 108 (90 Base) MCG/ACT inhaler Inhale 2 puffs into the lungs every 6 (six) hours as needed for wheezing or shortness of breath. 11/21/16  Yes Soyla Dryer, PA-C  atorvastatin (LIPITOR) 20 MG tablet TAKE 1 Tablet BY MOUTH ONCE DAILY Patient taking differently: Take 20 mg by mouth once daily 03/19/17  Yes Soyla Dryer, PA-C  lisinopril (PRINIVIL,ZESTRIL) 10 MG tablet TAKE 1 Tablet BY MOUTH ONCE DAILY Patient taking differently: Take 10 mg by mouth once daily 03/26/17  Yes McElroy, Larene Beach, PA-C  polyethylene glycol-electrolytes (TRILYTE) 420 g solution Take 4,000 mLs by mouth as directed. 01/22/17  Yes Daneil Dolin, MD    Allergies as of 03/05/2017  . (No Known Allergies)    Family History  Problem Relation Age of Onset  . Cancer Father        throat  . Diabetes Sister   . Colon cancer Neg Hx     Social History   Socioeconomic History  . Marital status: Single    Spouse name: Not on file  . Number of children: Not on file  . Years of education: Not on file  . Highest education level: Not on file  Social  Needs  . Financial resource strain: Not on file  . Food insecurity - worry: Not on file  . Food insecurity - inability: Not on file  . Transportation needs - medical: Not on file  . Transportation needs - non-medical: Not on file  Occupational History  . Not on file  Tobacco Use  . Smoking status: Current Every Day Smoker    Packs/day: 0.50    Years: 33.00    Pack years: 16.50    Types: Cigarettes  . Smokeless tobacco: Never Used  Substance and Sexual Activity  . Alcohol use: No    Comment: no etoh since 2014. remote heavy user.   . Drug use: No  . Sexual activity: Yes    Birth control/protection: None  Other Topics Concern  . Not on file  Social History Narrative  . Not on file    Review of Systems: See HPI, otherwise negative ROS  Physical Exam: BP 108/79   Pulse 78   Temp 98.4 F (36.9 C) (Oral)   Resp 20   SpO2 97%  General:   Alert,  Well-developed, well-nourished, pleasant and cooperative in NAD Neck:  Supple; no masses or thyromegaly. No significant cervical adenopathy. Lungs:  Clear throughout to auscultation.   No wheezes, crackles, or rhonchi. No acute distress. Heart:  Regular rate and rhythm; no murmurs,  clicks, rubs,  or gallops. Abdomen: Non-distended, normal bowel sounds.  Soft and nontender without appreciable mass or hepatosplenomegaly.  Pulses:  Normal pulses noted. Extremities:  Without clubbing or edema.  Impression:  Pleasant 51 year old gentleman Hemoccult-positive stool. Colonoscopy. Further evaluation warranted at this time.  Recommendations:  I have offered the patient a diagnostic colonoscopy today. The risks, benefits, limitations, alternatives and imponderables have been reviewed with the patient. Questions have been answered. All parties are agreeable.    Notice: This dictation was prepared with Dragon dictation along with smaller phrase technology. Any transcriptional errors that result from this process are unintentional and may not be  corrected upon review.

## 2017-04-18 NOTE — Anesthesia Procedure Notes (Signed)
Procedure Name: MAC Date/Time: 04/18/2017 10:00 AM Performed by: Vista Deck, CRNA Pre-anesthesia Checklist: Patient identified, Emergency Drugs available, Suction available, Timeout performed and Patient being monitored Patient Re-evaluated:Patient Re-evaluated prior to induction Oxygen Delivery Method: Non-rebreather mask

## 2017-04-18 NOTE — Discharge Instructions (Signed)
Diverticulosis and colon polyp information provided  Further recommendations to follow pending review of pathology report Colonoscopy Discharge Instructions  Read the instructions outlined below and refer to this sheet in the next few weeks. These discharge instructions provide you with general information on caring for yourself after you leave the hospital. Your doctor may also give you specific instructions. While your treatment has been planned according to the most current medical practices available, unavoidable complications occasionally occur. If you have any problems or questions after discharge, call Dr. Gala Romney at [phone]. ACTIVITY  You may resume your regular activity, but move at a slower pace for the next 24 hours.   Take frequent rest periods for the next 24 hours.   Walking will help get rid of the air and reduce the bloated feeling in your belly (abdomen).   No driving for 24 hours (because of the medicine (anesthesia) used during the test).    Do not sign any important legal documents or operate any machinery for 24 hours (because of the anesthesia used during the test).  NUTRITION  Drink plenty of fluids.   You may resume your normal diet as instructed by your doctor.   Begin with a light meal and progress to your normal diet. Heavy or fried foods are harder to digest and may make you feel sick to your stomach (nauseated).   Avoid alcoholic beverages for 24 hours or as instructed.  MEDICATIONS  You may resume your normal medications unless your doctor tells you otherwise.  WHAT YOU CAN EXPECT TODAY  Some feelings of bloating in the abdomen.   Passage of more gas than usual.   Spotting of blood in your stool or on the toilet paper.  IF YOU HAD POLYPS REMOVED DURING THE COLONOSCOPY:  No aspirin products for 7 days or as instructed.   No alcohol for 7 days or as instructed.   Eat a soft diet for the next 24 hours.  FINDING OUT THE RESULTS OF YOUR TEST Not  all test results are available during your visit. If your test results are not back during the visit, make an appointment with your caregiver to find out the results. Do not assume everything is normal if you have not heard from your caregiver or the medical facility. It is important for you to follow up on all of your test results.  SEEK IMMEDIATE MEDICAL ATTENTION IF:  You have more than a spotting of blood in your stool.   Your belly is swollen (abdominal distention).   You are nauseated or vomiting.   You have a temperature over 101.   You have abdominal pain or discomfort that is severe or gets worse throughout the day.     Colon Polyps Polyps are tissue growths inside the body. Polyps can grow in many places, including the large intestine (colon). A polyp may be a round bump or a mushroom-shaped growth. You could have one polyp or several. Most colon polyps are noncancerous (benign). However, some colon polyps can become cancerous over time. What are the causes? The exact cause of colon polyps is not known. What increases the risk? This condition is more likely to develop in people who:  Have a family history of colon cancer or colon polyps.  Are older than 60 or older than 45 if they are African American.  Have inflammatory bowel disease, such as ulcerative colitis or Crohn disease.  Are overweight.  Smoke cigarettes.  Do not get enough exercise.  Drink too much  alcohol.  Eat a diet that is: ? High in fat and red meat. ? Low in fiber.  Had childhood cancer that was treated with abdominal radiation.  What are the signs or symptoms? Most polyps do not cause symptoms. If you have symptoms, they may include:  Blood coming from your rectum when having a bowel movement.  Blood in your stool.The stool may look dark red or black.  A change in bowel habits, such as constipation or diarrhea.  How is this diagnosed? This condition is diagnosed with a colonoscopy.  This is a procedure that uses a lighted, flexible scope to look at the inside of your colon. How is this treated? Treatment for this condition involves removing any polyps that are found. Those polyps will then be tested for cancer. If cancer is found, your health care provider will talk to you about options for colon cancer treatment. Follow these instructions at home: Diet  Eat plenty of fiber, such as fruits, vegetables, and whole grains.  Eat foods that are high in calcium and vitamin D, such as milk, cheese, yogurt, eggs, liver, fish, and broccoli.  Limit foods high in fat, red meats, and processed meats, such as hot dogs, sausage, bacon, and lunch meats.  Maintain a healthy weight, or lose weight if recommended by your health care provider. General instructions  Do not smoke cigarettes.  Do not drink alcohol excessively.  Keep all follow-up visits as told by your health care provider. This is important. This includes keeping regularly scheduled colonoscopies. Talk to your health care provider about when you need a colonoscopy.  Exercise every day or as told by your health care provider. Contact a health care provider if:  You have new or worsening bleeding during a bowel movement.  You have new or increased blood in your stool.  You have a change in bowel habits.  You unexpectedly lose weight. This information is not intended to replace advice given to you by your health care provider. Make sure you discuss any questions you have with your health care provider. Document Released: 01/12/2004 Document Revised: 09/23/2015 Document Reviewed: 03/08/2015 Elsevier Interactive Patient Education  [employer].  Diverticulosis Diverticulosis is a condition that develops when small pouches (diverticula) form in the wall of the large intestine (colon). The colon is where water is absorbed and stool is formed. The pouches form when the inside layer of the colon pushes through weak  spots in the outer layers of the colon. You may have a few pouches or many of them. What are the causes? The cause of this condition is not known. What increases the risk? The following factors may make you more likely to develop this condition:  Being older than age 63. Your risk for this condition increases with age. Diverticulosis is rare among people younger than age 76. By age 27, many people have it.  Eating a low-fiber diet.  Having frequent constipation.  Being overweight.  Not getting enough exercise.  Smoking.  Taking over-the-counter pain medicines, like aspirin and ibuprofen.  Having a family history of diverticulosis.  What are the signs or symptoms? In most people, there are no symptoms of this condition. If you do have symptoms, they may include:  Bloating.  Cramps in the abdomen.  Constipation or diarrhea.  Pain in the lower left side of the abdomen.  How is this diagnosed? This condition is most often diagnosed during an exam for other colon problems. Because diverticulosis usually has no symptoms, it  often cannot be diagnosed independently. This condition may be diagnosed by:  Using a flexible scope to examine the colon (colonoscopy).  Taking an X-ray of the colon after dye has been put into the colon (barium enema).  Doing a CT scan.  How is this treated? You may not need treatment for this condition if you have never developed an infection related to diverticulosis. If you have had an infection before, treatment may include:  Eating a high-fiber diet. This may include eating more fruits, vegetables, and grains.  Taking a fiber supplement.  Taking a live bacteria supplement (probiotic).  Taking medicine to relax your colon.  Taking antibiotic medicines.  Follow these instructions at home:  Drink 6-8 glasses of water or more each day to prevent constipation.  Try not to strain when you have a bowel movement.  If you have had an infection  before: ? Eat more fiber as directed by your health care provider or your diet and nutrition specialist (dietitian). ? Take a fiber supplement or probiotic, if your health care provider approves.  Take over-the-counter and prescription medicines only as told by your health care provider.  If you were prescribed an antibiotic, take it as told by your health care provider. Do not stop taking the antibiotic even if you start to feel better.  Keep all follow-up visits as told by your health care provider. This is important. Contact a health care provider if:  You have pain in your abdomen.  You have bloating.  You have cramps.  You have not had a bowel movement in 3 days. Get help right away if:  Your pain gets worse.  Your bloating becomes very bad.  You have a fever or chills, and your symptoms suddenly get worse.  You vomit.  You have bowel movements that are bloody or black.  You have bleeding from your rectum. Summary  Diverticulosis is a condition that develops when small pouches (diverticula) form in the wall of the large intestine (colon).  You may have a few pouches or many of them.  This condition is most often diagnosed during an exam for other colon problems.  If you have had an infection related to diverticulosis, treatment may include increasing the fiber in your diet, taking supplements, or taking medicines. This information is not intended to replace advice given to you by your health care provider. Make sure you discuss any questions you have with your health care provider. Document Released: 01/13/2004 Document Revised: 03/06/2016 Document Reviewed: 03/06/2016 Elsevier Interactive Patient Education  2017 [employer].

## 2017-04-18 NOTE — Op Note (Signed)
[facility] Patient Name: [name] [name] Procedure Date: 04/18/2017 9:43 AM MRN: [id number] Date of Birth: 11/10/1965 Attending MD: Norvel Richards , MD CSN: [id number] Age: 51 Admit Type: Outpatient Procedure:                Colonoscopy Indications:              Heme positive stool Providers:                Norvel Richards, MD, Otis Peak B. Sharon Seller, RN,                            Nelma Rothman, Technician, Randa Spike, Technician Referring MD:              Medicines:                Propofol per Anesthesia Complications:            No immediate complications. Estimated Blood Loss:     Estimated blood loss was minimal. Procedure:                Pre-Anesthesia Assessment:                           - Prior to the procedure, a History and Physical                            was performed, and patient medications and                            allergies were reviewed. The patient's tolerance of                            previous anesthesia was also reviewed. The risks                            and benefits of the procedure and the sedation                            options and risks were discussed with the patient.                            All questions were answered, and informed consent                            was obtained. Prior Anticoagulants: The patient has                            taken no previous anticoagulant or antiplatelet                            agents. ASA Grade Assessment: II - A patient with                            mild systemic disease. After reviewing the risks  and benefits, the patient was deemed in                            satisfactory condition to undergo the procedure.                           - Prior to the procedure, a History and Physical                            was performed, and patient medications and                            allergies were reviewed. The patient's tolerance of   previous anesthesia was also reviewed. The risks                            and benefits of the procedure and the sedation                            options and risks were discussed with the patient.                            All questions were answered, and informed consent                            was obtained. Prior Anticoagulants: The patient has                            taken no previous anticoagulant or antiplatelet                            agents. After reviewing the risks and benefits, the                            patient was deemed in satisfactory condition to                            undergo the procedure.                           After obtaining informed consent, the colonoscope                            was passed under direct vision. Throughout the                            procedure, the patient's blood pressure, pulse, and                            oxygen saturations were monitored continuously. The                            EC-[id number] ([id number]) scope was introduced through  the and advanced to the the cecum, identified by                            appendiceal orifice and ileocecal valve. The                            colonoscopy was performed without difficulty. The                            patient tolerated the procedure well. The quality                            of the bowel preparation was adequate. The                            ileocecal valve, appendiceal orifice, and rectum                            were photographed. The colonoscopy was performed                            without difficulty. The patient tolerated the                            procedure well. Scope In: 10:10:38 AM Scope Out: 10:27:34 AM Scope Withdrawal Time: 0 hours 7 minutes 45 seconds  Total Procedure Duration: 0 hours 16 minutes 56 seconds  Findings:      The perianal and digital rectal examinations were normal.      Non-bleeding internal  hemorrhoids were found during retroflexion. The       hemorrhoids were Grade I (internal hemorrhoids that do not prolapse).      Two sessile polyps were found in the descending colon and cecum. The       polyps were 4 to 6 mm in size. .      Scattered medium-mouthed diverticula were found in the entire colon.      (1) 5 mm AVM in the cecum as well. These polyps were removed with a cold       snare. Resection and retrieval were complete. Estimated blood loss was       minimal Impression:               - Non-bleeding internal hemorrhoids.                           - Two 4 to 6 mm polyps in the descending colon and                            in the cecum, removed with a cold snare. Resected                            and retrieved.                           - Diverticulosis in the entire examined colon.                           -  single cecal AVM. Moderate Sedation:      Moderate (conscious) sedation was personally administered by an       anesthesia professional. The following parameters were monitored: oxygen       saturation, heart rate, blood pressure, respiratory rate, EKG, adequacy       of pulmonary ventilation, and response to care. Total physician       intraservice time was 24 minutes. Recommendation:           - Patient has a contact number available for                            emergencies. The signs and symptoms of potential                            delayed complications were discussed with the                            patient. Return to normal activities tomorrow.                            Written discharge instructions were provided to the                            patient.                           - Resume previous diet.                           - Continue present medications.                           - Await pathology results.                           - Repeat colonoscopy date to be determined after                            pending pathology results are  reviewed for                            surveillance based on pathology results.                           - Return to GI clinic (date not yet determined). Procedure Code(s):        --- Professional ---                           [phone], Colonoscopy, flexible; with removal of                            tumor(s), polyp(s), or other lesion(s) by snare                            technique Diagnosis Code(s):        --- Professional ---  D12.4, Benign neoplasm of descending colon                           D12.0, Benign neoplasm of cecum                           K64.0, First degree hemorrhoids                           R19.5, Other fecal abnormalities                           K57.30, Diverticulosis of large intestine without                            perforation or abscess without bleeding CPT copyright 2016 American Medical Association. All rights reserved. The codes documented in this report are preliminary and upon coder review may  be revised to meet current compliance requirements. [name], MD Norvel Richards, MD 04/18/2017 10:34:43 AM This report has been signed electronically. Number of Addenda: 0

## 2017-04-18 NOTE — Anesthesia Preprocedure Evaluation (Signed)
Anesthesia Evaluation  Patient identified by MRN, date of birth, ID band Patient awake    Reviewed: Allergy & Precautions, NPO status , Patient's Chart, lab work & pertinent test results  Airway Mallampati: I  TM Distance: >3 FB Neck ROM: Full    Dental  (+) Poor Dentition, Chipped, Missing, Dental Advisory Given   Pulmonary shortness of breath, asthma , Current Smoker,    breath sounds clear to auscultation       Cardiovascular hypertension, Pt. on medications + DOE   Rhythm:Regular Rate:Normal     Neuro/Psych negative neurological ROS  negative psych ROS   GI/Hepatic Neg liver ROS,   Endo/Other  negative endocrine ROS  Renal/GU Renal disease     Musculoskeletal   Abdominal   Peds  Hematology negative hematology ROS (+)   Anesthesia Other Findings   Reproductive/Obstetrics                             Anesthesia Physical Anesthesia Plan  ASA: II  Anesthesia Plan: MAC   Post-op Pain Management:    Induction: Intravenous  PONV Risk Score and Plan:   Airway Management Planned: Simple Face Mask  Additional Equipment:   Intra-op Plan:   Post-operative Plan:   Informed Consent: I have reviewed the patients History and Physical, chart, labs and discussed the procedure including the risks, benefits and alternatives for the proposed anesthesia with the patient or authorized representative who has indicated his/her understanding and acceptance.     Plan Discussed with:   Anesthesia Plan Comments:         Anesthesia Quick Evaluation

## 2017-04-18 NOTE — Anesthesia Postprocedure Evaluation (Signed)
Anesthesia Post Note  Patient: [name] [name]  Procedure(s) Performed: COLONOSCOPY WITH PROPOFOL (N/A ) POLYPECTOMY  Patient location during evaluation: PACU Anesthesia Type: MAC Level of consciousness: awake and alert Pain management: satisfactory to patient Vital Signs Assessment: post-procedure vital signs reviewed and stable Respiratory status: spontaneous breathing Cardiovascular status: stable Postop Assessment: no apparent nausea or vomiting Anesthetic complications: no     Last Vitals:  Vitals:   04/18/17 0925 04/18/17 1037  BP:  104/76  Pulse:  86  Resp: 20 20  Temp:  36.8 C  SpO2: 97% 100%    Last Pain:  Vitals:   04/18/17 0850  TempSrc: Oral                 [name]

## 2017-04-19 ENCOUNTER — Encounter: Payer: Self-pay | Admitting: Internal Medicine

## 2017-04-20 ENCOUNTER — Encounter (HOSPITAL_COMMUNITY): Payer: Self-pay | Admitting: Internal Medicine

## 2017-05-15 ENCOUNTER — Other Ambulatory Visit (HOSPITAL_COMMUNITY)
Admission: RE | Admit: 2017-05-15 | Discharge: 2017-05-15 | Disposition: A | Discharge status: Home / Self Care | Payer: Self-pay | Source: Ambulatory Visit | Attending: Physician Assistant | Admitting: Physician Assistant

## 2017-05-15 DIAGNOSIS — I10 Essential (primary) hypertension: Secondary | ICD-10-CM | POA: Insufficient documentation

## 2017-05-15 DIAGNOSIS — E785 Hyperlipidemia, unspecified: Secondary | ICD-10-CM | POA: Insufficient documentation

## 2017-05-15 LAB — COMPREHENSIVE METABOLIC PANEL
ALT: 12 U/L — ABNORMAL LOW (ref 17–63)
ANION GAP: 8 (ref 5–15)
AST: 18 U/L (ref 15–41)
Albumin: 3.8 g/dL (ref 3.5–5.0)
Alkaline Phosphatase: 55 U/L (ref 38–126)
BUN: 17 mg/dL (ref 6–20)
CHLORIDE: 105 mmol/L (ref 101–111)
CO2: 23 mmol/L (ref 22–32)
CREATININE: 1.34 mg/dL — AB (ref 0.61–1.24)
Calcium: 9.4 mg/dL (ref 8.9–10.3)
GFR, EST NON AFRICAN AMERICAN: 60 mL/min — AB (ref >60)
Glucose, Bld: 114 mg/dL — ABNORMAL HIGH (ref 65–99)
POTASSIUM: 4.6 mmol/L (ref 3.5–5.1)
SODIUM: 136 mmol/L (ref 135–145)
Total Bilirubin: 0.6 mg/dL (ref 0.3–1.2)
Total Protein: 7.8 g/dL (ref 6.5–8.1)

## 2017-05-15 LAB — LIPID PANEL
Cholesterol: 153 mg/dL (ref 0–200)
HDL: 44 mg/dL (ref >40)
LDL CALC: 85 mg/dL (ref 0–99)
TRIGLYCERIDES: 121 mg/dL (ref <150)
Total CHOL/HDL Ratio: 3.5 RATIO
VLDL: 24 mg/dL (ref 0–40)

## 2017-05-23 ENCOUNTER — Encounter: Payer: Self-pay | Admitting: Physician Assistant

## 2017-05-23 ENCOUNTER — Ambulatory Visit: Payer: Self-pay | Admitting: Physician Assistant

## 2017-05-23 VITALS — BP 100/62 | HR 91 | Temp 97.7°F | Ht 72.0 in | Wt 179.0 lb

## 2017-05-23 DIAGNOSIS — R7989 Other specified abnormal findings of blood chemistry: Secondary | ICD-10-CM

## 2017-05-23 DIAGNOSIS — I10 Essential (primary) hypertension: Secondary | ICD-10-CM

## 2017-05-23 DIAGNOSIS — K635 Polyp of colon: Secondary | ICD-10-CM

## 2017-05-23 DIAGNOSIS — J449 Chronic obstructive pulmonary disease, unspecified: Secondary | ICD-10-CM

## 2017-05-23 DIAGNOSIS — E785 Hyperlipidemia, unspecified: Secondary | ICD-10-CM

## 2017-05-23 DIAGNOSIS — F17219 Nicotine dependence, cigarettes, with unspecified nicotine-induced disorders: Secondary | ICD-10-CM

## 2017-05-23 NOTE — Progress Notes (Signed)
BP 100/62   Pulse 91   Temp 97.7 F (36.5 C)   Ht 6' (1.829 m)   Wt 179 lb (81.2 kg)   SpO2 99%   BMI 24.28 kg/m    Subjective:    Patient ID: [name] [name], male    DOB: 11-30-1965, 52 y.o.   MRN: [id number]  HPI: [name] [name] is a 52 y.o. male presenting on 05/23/2017 for Follow-up   HPI Pt is doing well.  He says his breathing is okay lately even though he continues to smoke.    Pt had a colonoscopy since his last OV.  He had polyps  Relevant past medical, surgical, family and social history reviewed and updated as indicated. Interim medical history since our last visit reviewed. Allergies and medications reviewed and updated.   Current Outpatient Medications:  .  acetaminophen (TYLENOL) 500 MG tablet, Take 1,000-2,000 mg by mouth daily as needed for moderate pain or headache. , Disp: , Rfl:  .  albuterol (PROVENTIL HFA;VENTOLIN HFA) 108 (90 Base) MCG/ACT inhaler, Inhale 2 puffs into the lungs every 6 (six) hours as needed for wheezing or shortness of breath., Disp: 3 Inhaler, Rfl: 1 .  atorvastatin (LIPITOR) 20 MG tablet, TAKE 1 Tablet BY MOUTH ONCE DAILY, Disp: 90 tablet, Rfl: 2 .  lisinopril (PRINIVIL,ZESTRIL) 10 MG tablet, TAKE 1 Tablet BY MOUTH ONCE DAILY, Disp: 90 tablet, Rfl: 2   Review of Systems  Constitutional: Negative for appetite change, chills, diaphoresis, fatigue, fever and unexpected weight change.  HENT: Negative for congestion, dental problem, drooling, ear pain, facial swelling, hearing loss, mouth sores, sneezing, sore throat, trouble swallowing and voice change.   Eyes: Positive for redness and itching. Negative for pain, discharge and visual disturbance.  Respiratory: Positive for wheezing. Negative for cough, choking and shortness of breath.   Cardiovascular: Negative for chest pain, palpitations and leg swelling.  Gastrointestinal: Negative for abdominal pain, blood in stool, constipation, diarrhea and vomiting.  Endocrine: Negative for cold  intolerance, heat intolerance and polydipsia.  Genitourinary: Negative for decreased urine volume, dysuria and hematuria.  Musculoskeletal: Positive for arthralgias, back pain and gait problem.  Skin: Negative for rash.  Allergic/Immunologic: Negative for environmental allergies.  Neurological: Negative for seizures, syncope, light-headedness and headaches.  Hematological: Negative for adenopathy.  Psychiatric/Behavioral: Negative for agitation, dysphoric mood and suicidal ideas. The patient is not nervous/anxious.     Per HPI unless specifically indicated above     Objective:    BP 100/62   Pulse 91   Temp 97.7 F (36.5 C)   Ht 6' (1.829 m)   Wt 179 lb (81.2 kg)   SpO2 99%   BMI 24.28 kg/m   Wt Readings from Last 3 Encounters:  05/23/17 179 lb (81.2 kg)  03/01/17 174 lb (78.9 kg)  02/21/17 175 lb (79.4 kg)    Physical Exam  Constitutional: He is oriented to person, place, and time. He appears well-developed and well-nourished.  HENT:  Head: Normocephalic and atraumatic.  Neck: Neck supple.  Cardiovascular: Normal rate and regular rhythm.  Pulmonary/Chest: Effort normal and breath sounds normal. He has no wheezes.  Abdominal: Soft. Bowel sounds are normal. There is no hepatosplenomegaly. There is no tenderness.  Musculoskeletal: He exhibits no edema.  Lymphadenopathy:    He has no cervical adenopathy.  Neurological: He is alert and oriented to person, place, and time.  Skin: Skin is warm and dry.  Psychiatric: He has a normal mood and affect. His behavior is normal.  Vitals reviewed.   Results for orders placed or performed during the hospital encounter of 05/15/17  Lipid panel  Result Value Ref Range   Cholesterol 153 0 - 200 mg/dL   Triglycerides 121 <150 mg/dL   HDL 44 >40 mg/dL   Total CHOL/HDL Ratio 3.5 RATIO   VLDL 24 0 - 40 mg/dL   LDL Cholesterol 85 0 - 99 mg/dL  Comprehensive metabolic panel  Result Value Ref Range   Sodium 136 135 - 145 mmol/L    Potassium 4.6 3.5 - 5.1 mmol/L   Chloride 105 101 - 111 mmol/L   CO2 23 22 - 32 mmol/L   Glucose, Bld 114 (H) 65 - 99 mg/dL   BUN 17 6 - 20 mg/dL   Creatinine, Ser 1.34 (H) 0.61 - 1.24 mg/dL   Calcium 9.4 8.9 - 10.3 mg/dL   Total Protein 7.8 6.5 - 8.1 g/dL   Albumin 3.8 3.5 - 5.0 g/dL   AST 18 15 - 41 U/L   ALT 12 (L) 17 - 63 U/L   Alkaline Phosphatase 55 38 - 126 U/L   Total Bilirubin 0.6 0.3 - 1.2 mg/dL   GFR calc non Af Amer 60 (L) >60 mL/min   GFR calc Af Amer >60 >60 mL/min   Anion gap 8 5 - 15      Assessment & Plan:   Encounter Diagnoses  Name Primary?  . Essential hypertension, benign Yes  . Hyperlipidemia, unspecified hyperlipidemia type   . Chronic obstructive pulmonary disease, unspecified COPD type ([place])   . Cigarette nicotine dependence with nicotine-induced disorder   . Elevated serum creatinine   . Polyp of colon, unspecified part of colon, unspecified type     -reviewed labs with pt -Cr up today.   -pt says he has [facility].  He will call and let us know effective dates -pt to continue current medications -counseled smoking cessation -will Recheck Cr 1 month -pt to return for follow up Office visit in 3 months.  RTO sooner prn

## 2017-06-18 ENCOUNTER — Other Ambulatory Visit: Payer: Self-pay | Admitting: Physician Assistant

## 2017-06-19 ENCOUNTER — Other Ambulatory Visit (HOSPITAL_COMMUNITY)
Admission: RE | Admit: 2017-06-19 | Discharge: 2017-06-19 | Disposition: A | Discharge status: Home / Self Care | Payer: Self-pay | Source: Ambulatory Visit | Attending: Physician Assistant | Admitting: Physician Assistant

## 2017-06-19 DIAGNOSIS — I10 Essential (primary) hypertension: Secondary | ICD-10-CM | POA: Insufficient documentation

## 2017-06-19 DIAGNOSIS — R7989 Other specified abnormal findings of blood chemistry: Secondary | ICD-10-CM | POA: Insufficient documentation

## 2017-06-19 LAB — BASIC METABOLIC PANEL
Anion gap: 10 (ref 5–15)
BUN: 10 mg/dL (ref 6–20)
CALCIUM: 9 mg/dL (ref 8.9–10.3)
CO2: 23 mmol/L (ref 22–32)
CREATININE: 1.08 mg/dL (ref 0.61–1.24)
Chloride: 104 mmol/L (ref 101–111)
GFR calc non Af Amer: >60 mL/min (ref >60)
Glucose, Bld: 102 mg/dL — ABNORMAL HIGH (ref 65–99)
Potassium: 4.4 mmol/L (ref 3.5–5.1)
Sodium: 137 mmol/L (ref 135–145)

## 2017-08-14 ENCOUNTER — Ambulatory Visit: Payer: Self-pay | Admitting: Physician Assistant

## 2017-08-14 ENCOUNTER — Encounter: Payer: Self-pay | Admitting: Physician Assistant

## 2017-08-14 VITALS — BP 124/76 | HR 82 | Temp 97.9°F | Ht 72.0 in | Wt 178.5 lb

## 2017-08-14 DIAGNOSIS — J449 Chronic obstructive pulmonary disease, unspecified: Secondary | ICD-10-CM

## 2017-08-14 DIAGNOSIS — Z125 Encounter for screening for malignant neoplasm of prostate: Secondary | ICD-10-CM

## 2017-08-14 DIAGNOSIS — E785 Hyperlipidemia, unspecified: Secondary | ICD-10-CM

## 2017-08-14 DIAGNOSIS — F17219 Nicotine dependence, cigarettes, with unspecified nicotine-induced disorders: Secondary | ICD-10-CM

## 2017-08-14 DIAGNOSIS — I10 Essential (primary) hypertension: Secondary | ICD-10-CM

## 2017-08-14 MED ORDER — MOMETASONE FURO-FORMOTEROL FUM 100-5 MCG/ACT IN AERO: 2.0000 | INHALATION_SPRAY | Freq: Two times a day (BID) | RESPIRATORY_TRACT | 1 refills | Status: DC

## 2017-08-14 NOTE — Progress Notes (Signed)
BP 124/76 (BP Location: Right Arm, Patient Position: Sitting, Cuff Size: Normal)   Pulse 82   Temp 97.9 F (36.6 C) (Other (Comment))   Ht 6' (1.829 m)   Wt 178 lb 8 oz (81 kg)   SpO2 98%   BMI 24.21 kg/m    Subjective:    Patient ID: [name] [name], male    DOB: [birth year], 52 y.o.   MRN: [id number]  HPI: [name] [name] is a 52 y.o. male presenting on 08/14/2017 for Hypertension; Hyperlipidemia; and COPD   HPI   Pt is using his inhaler tid every day.  He is still smoking.   Pt is feeling well and has no complaints  Relevant past medical, surgical, family and social history reviewed and updated as indicated. Interim medical history since our last visit reviewed. Allergies and medications reviewed and updated.   Current Outpatient Medications:  .  acetaminophen (TYLENOL) 500 MG tablet, Take 1,000-2,000 mg by mouth daily as needed for moderate pain or headache. , Disp: , Rfl:  .  atorvastatin (LIPITOR) 20 MG tablet, TAKE 1 Tablet BY MOUTH ONCE DAILY, Disp: 90 tablet, Rfl: 2 .  lisinopril (PRINIVIL,ZESTRIL) 10 MG tablet, TAKE 1 Tablet BY MOUTH ONCE DAILY, Disp: 90 tablet, Rfl: 2 .  PROVENTIL HFA 108 (90 Base) MCG/ACT inhaler, INHALE 2 PUFFS BY MOUTH EVERY 6 HOURS AS NEEDED FOR COUGHING, WHEEZING, OR SHORTNESS OF BREATH, Disp: 3 Inhaler, Rfl: 1   Review of Systems  Constitutional: Negative for appetite change, chills, diaphoresis, fatigue, fever and unexpected weight change.  HENT: Negative for congestion, dental problem, drooling, ear pain, facial swelling, hearing loss, mouth sores, sneezing, sore throat, trouble swallowing and voice change.   Eyes: Positive for pain, redness, itching and visual disturbance. Negative for discharge.  Respiratory: Positive for shortness of breath and wheezing. Negative for cough and choking.   Cardiovascular: Positive for leg swelling. Negative for chest pain and palpitations.  Gastrointestinal: Negative for abdominal pain, blood in stool,  constipation, diarrhea and vomiting.  Endocrine: Negative for cold intolerance, heat intolerance and polydipsia.  Genitourinary: Negative for decreased urine volume, dysuria and hematuria.  Musculoskeletal: Positive for arthralgias, back pain and gait problem.  Skin: Negative for rash.  Allergic/Immunologic: Negative for environmental allergies.  Neurological: Negative for seizures, syncope, light-headedness and headaches.  Hematological: Negative for adenopathy.  Psychiatric/Behavioral: Negative for agitation, dysphoric mood and suicidal ideas. The patient is not nervous/anxious.     Per HPI unless specifically indicated above     Objective:    BP 124/76 (BP Location: Right Arm, Patient Position: Sitting, Cuff Size: Normal)   Pulse 82   Temp 97.9 F (36.6 C) (Other (Comment))   Ht 6' (1.829 m)   Wt 178 lb 8 oz (81 kg)   SpO2 98%   BMI 24.21 kg/m   Wt Readings from Last 3 Encounters:  08/14/17 178 lb 8 oz (81 kg)  05/23/17 179 lb (81.2 kg)  03/01/17 174 lb (78.9 kg)    Physical Exam  Constitutional: He is oriented to person, place, and time. He appears well-developed and well-nourished.  HENT:  Head: Normocephalic and atraumatic.  Neck: Neck supple.  Cardiovascular: Normal rate and regular rhythm.  Pulmonary/Chest: Effort normal and breath sounds normal. He has no wheezes.  Abdominal: Soft. Bowel sounds are normal. There is no hepatosplenomegaly. There is no tenderness.  Musculoskeletal: He exhibits no edema.  Lymphadenopathy:    He has no cervical adenopathy.  Neurological: He is alert and oriented to  person, place, and time.  Skin: Skin is warm and dry.  Psychiatric: He has a normal mood and affect. His behavior is normal.  Vitals reviewed.       Assessment & Plan:   Encounter Diagnoses  Name Primary?  . Essential hypertension, benign Yes  . Hyperlipidemia, unspecified hyperlipidemia type   . Chronic obstructive pulmonary disease, unspecified COPD type ([place])    . Cigarette nicotine dependence with nicotine-induced disorder   . Screening for prostate cancer      -pt to get fasting labs drawn tomorrow.  Check lipids, cmp, psa.  Will call pt with results -Add dulera -counseled on smoking cessation -pt to follow up in 3 months.  RTO sooner prn

## 2017-08-22 ENCOUNTER — Other Ambulatory Visit (HOSPITAL_COMMUNITY)
Admission: RE | Admit: 2017-08-22 | Discharge: 2017-08-22 | Disposition: A | Discharge status: Home / Self Care | Payer: Self-pay | Source: Ambulatory Visit | Attending: Physician Assistant | Admitting: Physician Assistant

## 2017-08-22 DIAGNOSIS — I10 Essential (primary) hypertension: Secondary | ICD-10-CM | POA: Insufficient documentation

## 2017-08-22 DIAGNOSIS — Z125 Encounter for screening for malignant neoplasm of prostate: Secondary | ICD-10-CM | POA: Insufficient documentation

## 2017-08-22 DIAGNOSIS — E785 Hyperlipidemia, unspecified: Secondary | ICD-10-CM | POA: Insufficient documentation

## 2017-08-22 LAB — COMPREHENSIVE METABOLIC PANEL
ALBUMIN: 3.7 g/dL (ref 3.5–5.0)
ALK PHOS: 63 U/L (ref 38–126)
ALT: 47 U/L (ref 17–63)
ANION GAP: 11 (ref 5–15)
AST: 67 U/L — ABNORMAL HIGH (ref 15–41)
BILIRUBIN TOTAL: 0.6 mg/dL (ref 0.3–1.2)
BUN: 9 mg/dL (ref 6–20)
CALCIUM: 8.7 mg/dL — AB (ref 8.9–10.3)
CO2: 20 mmol/L — ABNORMAL LOW (ref 22–32)
Chloride: 104 mmol/L (ref 101–111)
Creatinine, Ser: 1.01 mg/dL (ref 0.61–1.24)
GLUCOSE: 85 mg/dL (ref 65–99)
Potassium: 4.1 mmol/L (ref 3.5–5.1)
Sodium: 135 mmol/L (ref 135–145)
TOTAL PROTEIN: 7.7 g/dL (ref 6.5–8.1)

## 2017-08-22 LAB — LIPID PANEL
CHOLESTEROL: 162 mg/dL (ref 0–200)
HDL: 76 mg/dL (ref >40)
LDL Cholesterol: 75 mg/dL (ref 0–99)
Total CHOL/HDL Ratio: 2.1 RATIO
Triglycerides: 56 mg/dL (ref <150)
VLDL: 11 mg/dL (ref 0–40)

## 2017-08-22 LAB — PSA: Prostatic Specific Antigen: 0.81 ng/mL (ref 0.00–4.00)

## 2017-11-08 ENCOUNTER — Other Ambulatory Visit: Payer: Self-pay | Admitting: Physician Assistant

## 2017-11-08 DIAGNOSIS — E785 Hyperlipidemia, unspecified: Secondary | ICD-10-CM

## 2017-11-08 DIAGNOSIS — I10 Essential (primary) hypertension: Secondary | ICD-10-CM

## 2017-11-08 NOTE — Progress Notes (Unsigned)
cmp

## 2017-11-09 ENCOUNTER — Other Ambulatory Visit (HOSPITAL_COMMUNITY)
Admission: RE | Admit: 2017-11-09 | Discharge: 2017-11-09 | Disposition: A | Discharge status: Home / Self Care | Payer: Self-pay | Source: Ambulatory Visit | Attending: Physician Assistant | Admitting: Physician Assistant

## 2017-11-09 DIAGNOSIS — I10 Essential (primary) hypertension: Secondary | ICD-10-CM | POA: Insufficient documentation

## 2017-11-09 DIAGNOSIS — E785 Hyperlipidemia, unspecified: Secondary | ICD-10-CM | POA: Insufficient documentation

## 2017-11-09 LAB — COMPREHENSIVE METABOLIC PANEL
ALBUMIN: 3.6 g/dL (ref 3.5–5.0)
ALT: 69 U/L — AB (ref 0–44)
AST: 87 U/L — AB (ref 15–41)
Alkaline Phosphatase: 68 U/L (ref 38–126)
Anion gap: 8 (ref 5–15)
BUN: 9 mg/dL (ref 6–20)
CHLORIDE: 103 mmol/L (ref 98–111)
CO2: 24 mmol/L (ref 22–32)
CREATININE: 0.96 mg/dL (ref 0.61–1.24)
Calcium: 9.1 mg/dL (ref 8.9–10.3)
GFR calc Af Amer: >60 mL/min (ref >60)
GFR calc non Af Amer: >60 mL/min (ref >60)
Glucose, Bld: 88 mg/dL (ref 70–99)
Potassium: 4.6 mmol/L (ref 3.5–5.1)
SODIUM: 135 mmol/L (ref 135–145)
Total Bilirubin: 0.6 mg/dL (ref 0.3–1.2)
Total Protein: 7.7 g/dL (ref 6.5–8.1)

## 2017-11-09 LAB — LIPID PANEL
CHOL/HDL RATIO: 1.9 ratio
Cholesterol: 147 mg/dL (ref 0–200)
HDL: 79 mg/dL (ref >40)
LDL CALC: 59 mg/dL (ref 0–99)
Triglycerides: 45 mg/dL (ref <150)
VLDL: 9 mg/dL (ref 0–40)

## 2017-11-14 ENCOUNTER — Encounter: Payer: Self-pay | Admitting: Physician Assistant

## 2017-11-14 ENCOUNTER — Ambulatory Visit: Payer: Self-pay | Admitting: Physician Assistant

## 2017-11-14 VITALS — BP 129/82 | HR 87 | Temp 97.9°F

## 2017-11-14 DIAGNOSIS — E785 Hyperlipidemia, unspecified: Secondary | ICD-10-CM

## 2017-11-14 DIAGNOSIS — R7989 Other specified abnormal findings of blood chemistry: Secondary | ICD-10-CM

## 2017-11-14 DIAGNOSIS — F17219 Nicotine dependence, cigarettes, with unspecified nicotine-induced disorders: Secondary | ICD-10-CM

## 2017-11-14 DIAGNOSIS — I10 Essential (primary) hypertension: Secondary | ICD-10-CM

## 2017-11-14 DIAGNOSIS — R945 Abnormal results of liver function studies: Secondary | ICD-10-CM

## 2017-11-14 DIAGNOSIS — J449 Chronic obstructive pulmonary disease, unspecified: Secondary | ICD-10-CM

## 2017-11-14 NOTE — Progress Notes (Signed)
BP 129/82   Pulse 87   Temp 97.9 F (36.6 C)   SpO2 96%    Subjective:    Patient ID: [name] [name], male    DOB: 10-19-1965, 52 y.o.   MRN: [id number]  HPI: [name] [name] is a 52 y.o. male presenting on 11/14/2017 for COPD; Hypertension; and Hyperlipidemia   HPI   Pt says he takes tylenol 4 daily for back pain.  He sometimes takes more.   He says he is doing well except for his back pain.  He is still smoking some.  His breathing is okay except when he is out in the hot heat.  Relevant past medical, surgical, family and social history reviewed and updated as indicated. Interim medical history since our last visit reviewed. Allergies and medications reviewed and updated.   Current Outpatient Medications:  .  acetaminophen (TYLENOL) 500 MG tablet, Take 1,000-2,000 mg by mouth daily as needed for moderate pain or headache. , Disp: , Rfl:  .  atorvastatin (LIPITOR) 20 MG tablet, TAKE 1 Tablet BY MOUTH ONCE DAILY, Disp: 90 tablet, Rfl: 2 .  lisinopril (PRINIVIL,ZESTRIL) 10 MG tablet, TAKE 1 Tablet BY MOUTH ONCE DAILY, Disp: 90 tablet, Rfl: 2 .  mometasone-formoterol (DULERA) 100-5 MCG/ACT AERO, Inhale 2 puffs into the lungs 2 (two) times daily., Disp: 3 Inhaler, Rfl: 1 .  PROVENTIL HFA 108 (90 Base) MCG/ACT inhaler, INHALE 2 PUFFS BY MOUTH EVERY 6 HOURS AS NEEDED FOR COUGHING, WHEEZING, OR SHORTNESS OF BREATH, Disp: 3 Inhaler, Rfl: 1  Review of Systems  Constitutional: Negative for appetite change, chills, diaphoresis, fatigue, fever and unexpected weight change.  HENT: Positive for sneezing. Negative for congestion, dental problem, drooling, ear pain, facial swelling, hearing loss, mouth sores, sore throat, trouble swallowing and voice change.   Eyes: Positive for redness and visual disturbance. Negative for pain, discharge and itching.  Respiratory: Positive for shortness of breath and wheezing. Negative for cough and choking.   Cardiovascular: Negative for chest pain, palpitations  and leg swelling.  Gastrointestinal: Negative for abdominal pain, blood in stool, constipation, diarrhea and vomiting.  Endocrine: Negative for cold intolerance, heat intolerance and polydipsia.  Genitourinary: Negative for decreased urine volume, dysuria and hematuria.  Musculoskeletal: Positive for arthralgias, back pain and gait problem.  Skin: Negative for rash.  Allergic/Immunologic: Negative for environmental allergies.  Neurological: Negative for seizures, syncope, light-headedness and headaches.  Hematological: Negative for adenopathy.  Psychiatric/Behavioral: Negative for agitation, dysphoric mood and suicidal ideas. The patient is not nervous/anxious.     Per HPI unless specifically indicated above     Objective:    BP 129/82   Pulse 87   Temp 97.9 F (36.6 C)   SpO2 96%   Wt Readings from Last 3 Encounters:  08/14/17 178 lb 8 oz (81 kg)  05/23/17 179 lb (81.2 kg)  03/01/17 174 lb (78.9 kg)    Physical Exam  Constitutional: He is oriented to person, place, and time. He appears well-developed and well-nourished.  HENT:  Head: Normocephalic and atraumatic.  Neck: Neck supple.  Cardiovascular: Normal rate and regular rhythm.  Pulmonary/Chest: Effort normal and breath sounds normal. He has no wheezes.  Abdominal: Soft. Bowel sounds are normal. There is no hepatosplenomegaly. There is no tenderness.  Musculoskeletal: He exhibits no edema.  Lymphadenopathy:    He has no cervical adenopathy.  Neurological: He is alert and oriented to person, place, and time.  Skin: Skin is warm and dry.  Psychiatric: He has a normal  mood and affect. His behavior is normal.  Vitals reviewed.   Results for orders placed or performed during the hospital encounter of 11/09/17  Lipid panel  Result Value Ref Range   Cholesterol 147 0 - 200 mg/dL   Triglycerides 45 <150 mg/dL   HDL 79 >40 mg/dL   Total CHOL/HDL Ratio 1.9 RATIO   VLDL 9 0 - 40 mg/dL   LDL Cholesterol 59 0 - 99 mg/dL   Comprehensive metabolic panel  Result Value Ref Range   Sodium 135 135 - 145 mmol/L   Potassium 4.6 3.5 - 5.1 mmol/L   Chloride 103 98 - 111 mmol/L   CO2 24 22 - 32 mmol/L   Glucose, Bld 88 70 - 99 mg/dL   BUN 9 6 - 20 mg/dL   Creatinine, Ser 0.96 0.61 - 1.24 mg/dL   Calcium 9.1 8.9 - 10.3 mg/dL   Total Protein 7.7 6.5 - 8.1 g/dL   Albumin 3.6 3.5 - 5.0 g/dL   AST 87 (H) 15 - 41 U/L   ALT 69 (H) 0 - 44 U/L   Alkaline Phosphatase 68 38 - 126 U/L   Total Bilirubin 0.6 0.3 - 1.2 mg/dL   GFR calc non Af Amer >60 >60 mL/min   GFR calc Af Amer >60 >60 mL/min   Anion gap 8 5 - 15      Assessment & Plan:    Encounter Diagnoses  Name Primary?  . Essential hypertension, benign Yes  . Hyperlipidemia, unspecified hyperlipidemia type   . Chronic obstructive pulmonary disease, unspecified COPD type ([place])   . Cigarette nicotine dependence with nicotine-induced disorder   . Elevated LFTs     -reviewed labs with pt -No changes to medications today -counseled pt to avoid taking more than 4 tylenols daily.  If he needs something additionally, he should take an IBU or aleve -counseled smoking cessation -pt to follow up 4 months.  RTO sooner prn

## 2017-12-17 ENCOUNTER — Other Ambulatory Visit: Payer: Self-pay | Admitting: Physician Assistant

## 2018-03-11 ENCOUNTER — Other Ambulatory Visit: Payer: Self-pay | Admitting: Physician Assistant

## 2018-03-14 ENCOUNTER — Other Ambulatory Visit (HOSPITAL_COMMUNITY)
Admission: RE | Admit: 2018-03-14 | Discharge: 2018-03-14 | Disposition: A | Discharge status: Home / Self Care | Payer: Medicaid Other | Source: Ambulatory Visit | Attending: Physician Assistant | Admitting: Physician Assistant

## 2018-03-14 DIAGNOSIS — R945 Abnormal results of liver function studies: Secondary | ICD-10-CM | POA: Diagnosis present

## 2018-03-14 DIAGNOSIS — E785 Hyperlipidemia, unspecified: Secondary | ICD-10-CM | POA: Diagnosis not present

## 2018-03-14 DIAGNOSIS — I10 Essential (primary) hypertension: Secondary | ICD-10-CM | POA: Insufficient documentation

## 2018-03-14 DIAGNOSIS — R69 Illness, unspecified: Secondary | ICD-10-CM | POA: Diagnosis present

## 2018-03-14 DIAGNOSIS — R7989 Other specified abnormal findings of blood chemistry: Secondary | ICD-10-CM

## 2018-03-14 LAB — COMPREHENSIVE METABOLIC PANEL
ALK PHOS: 70 U/L (ref 38–126)
ALT: 33 U/L (ref 0–44)
ANION GAP: 8 (ref 5–15)
AST: 57 U/L — ABNORMAL HIGH (ref 15–41)
Albumin: 3.5 g/dL (ref 3.5–5.0)
BILIRUBIN TOTAL: 0.3 mg/dL (ref 0.3–1.2)
BUN: 6 mg/dL (ref 6–20)
CALCIUM: 8.5 mg/dL — AB (ref 8.9–10.3)
CO2: 25 mmol/L (ref 22–32)
Chloride: 107 mmol/L (ref 98–111)
Creatinine, Ser: 1.04 mg/dL (ref 0.61–1.24)
GFR calc Af Amer: >60 mL/min (ref >60)
GLUCOSE: 86 mg/dL (ref 70–99)
POTASSIUM: 3.7 mmol/L (ref 3.5–5.1)
Sodium: 140 mmol/L (ref 135–145)
Total Protein: 7.8 g/dL (ref 6.5–8.1)

## 2018-03-14 LAB — LIPID PANEL
CHOL/HDL RATIO: 1.9 ratio
CHOLESTEROL: 176 mg/dL (ref 0–200)
HDL: 92 mg/dL (ref >40)
LDL Cholesterol: 72 mg/dL (ref 0–99)
TRIGLYCERIDES: 59 mg/dL (ref <150)
VLDL: 12 mg/dL (ref 0–40)

## 2018-03-18 ENCOUNTER — Ambulatory Visit: Payer: Self-pay | Admitting: Physician Assistant

## 2018-03-20 ENCOUNTER — Encounter: Payer: Self-pay | Admitting: Physician Assistant

## 2018-03-20 ENCOUNTER — Ambulatory Visit: Payer: Self-pay | Admitting: Physician Assistant

## 2018-03-20 VITALS — BP 112/80 | HR 105 | Ht 72.0 in | Wt 169.5 lb

## 2018-03-20 DIAGNOSIS — E785 Hyperlipidemia, unspecified: Secondary | ICD-10-CM

## 2018-03-20 DIAGNOSIS — I10 Essential (primary) hypertension: Secondary | ICD-10-CM

## 2018-03-20 DIAGNOSIS — J449 Chronic obstructive pulmonary disease, unspecified: Secondary | ICD-10-CM

## 2018-03-20 DIAGNOSIS — Z125 Encounter for screening for malignant neoplasm of prostate: Secondary | ICD-10-CM

## 2018-03-20 DIAGNOSIS — F17200 Nicotine dependence, unspecified, uncomplicated: Secondary | ICD-10-CM

## 2018-03-20 NOTE — Progress Notes (Signed)
BP 112/80 (BP Location: Left Arm, Patient Position: Sitting, Cuff Size: Normal)   Pulse (!) 105   Ht 6' (1.829 m)   Wt 169 lb 8 oz (76.9 kg)   SpO2 99%   BMI 22.99 kg/m    Subjective:    Patient ID: [name] [name], male    DOB: 1965/10/25, 52 y.o.   MRN: [id number]  HPI: [name] [name] is a 52 y.o. male presenting on 03/20/2018 for Hypertension; Hyperlipidemia; and COPD   HPI  Pt is doing well today and has no complaints.  He is still smoking  Relevant past medical, surgical, family and social history reviewed and updated as indicated. Interim medical history since our last visit reviewed. Allergies and medications reviewed and updated.   Current Outpatient Medications:  .  acetaminophen (TYLENOL) 500 MG tablet, Take 1,000-2,000 mg by mouth daily as needed for moderate pain or headache. , Disp: , Rfl:  .  atorvastatin (LIPITOR) 20 MG tablet, TAKE 1 Tablet BY MOUTH ONCE DAILY, Disp: 90 tablet, Rfl: 0 .  DULERA 100-5 MCG/ACT AERO, INHALE 2 PUFFS BY MOUTH TWICE DAILY. RINSE MOUTH AFTER EACH USE, Disp: 39 g, Rfl: 0 .  lisinopril (PRINIVIL,ZESTRIL) 10 MG tablet, TAKE 1 Tablet BY MOUTH ONCE DAILY, Disp: 90 tablet, Rfl: 1 .  PROVENTIL HFA 108 (90 Base) MCG/ACT inhaler, INHALE 2 PUFFS BY MOUTH EVERY 6 HOURS AS NEEDED FOR COUGHING, WHEEZING, OR SHORTNESS OF BREATH, Disp: 20.1 g, Rfl: 0  Review of Systems  Constitutional: Negative for appetite change, chills, diaphoresis, fatigue, fever and unexpected weight change.  HENT: Positive for dental problem. Negative for congestion, drooling, ear pain, facial swelling, hearing loss, mouth sores, sneezing, sore throat, trouble swallowing and voice change.   Eyes: Positive for pain, discharge, redness, itching and visual disturbance.  Respiratory: Positive for shortness of breath and wheezing. Negative for cough and choking.   Cardiovascular: Negative for chest pain, palpitations and leg swelling.  Gastrointestinal: Negative for abdominal pain, blood  in stool, constipation, diarrhea and vomiting.  Endocrine: Negative for cold intolerance, heat intolerance and polydipsia.  Genitourinary: Negative for decreased urine volume, dysuria and hematuria.  Musculoskeletal: Positive for arthralgias, back pain and gait problem.  Skin: Negative for rash.  Allergic/Immunologic: Negative for environmental allergies.  Neurological: Negative for seizures, syncope, light-headedness and headaches.  Hematological: Negative for adenopathy.  Psychiatric/Behavioral: Negative for agitation, dysphoric mood and suicidal ideas. The patient is not nervous/anxious.     Per HPI unless specifically indicated above     Objective:    BP 112/80 (BP Location: Left Arm, Patient Position: Sitting, Cuff Size: Normal)   Pulse (!) 105   Ht 6' (1.829 m)   Wt 169 lb 8 oz (76.9 kg)   SpO2 99%   BMI 22.99 kg/m   Wt Readings from Last 3 Encounters:  03/20/18 169 lb 8 oz (76.9 kg)  08/14/17 178 lb 8 oz (81 kg)  05/23/17 179 lb (81.2 kg)    Physical Exam  Constitutional: He is oriented to person, place, and time. He appears well-developed and well-nourished.  HENT:  Head: Normocephalic and atraumatic.  Neck: Neck supple.  Cardiovascular: Normal rate and regular rhythm.  Pulmonary/Chest: Effort normal and breath sounds normal. He has no wheezes.  Abdominal: Soft. Bowel sounds are normal. There is no hepatosplenomegaly. There is no tenderness.  Musculoskeletal: He exhibits no edema.  Lymphadenopathy:    He has no cervical adenopathy.  Neurological: He is alert and oriented to person, place, and time.  Skin: Skin is warm and dry.  Psychiatric: He has a normal mood and affect. His behavior is normal.  Vitals reviewed.   Results for orders placed or performed during the hospital encounter of 03/14/18  Lipid panel  Result Value Ref Range   Cholesterol 176 0 - 200 mg/dL   Triglycerides 59 <150 mg/dL   HDL 92 >40 mg/dL   Total CHOL/HDL Ratio 1.9 RATIO   VLDL 12 0 -  40 mg/dL   LDL Cholesterol 72 0 - 99 mg/dL  Comprehensive metabolic panel  Result Value Ref Range   Sodium 140 135 - 145 mmol/L   Potassium 3.7 3.5 - 5.1 mmol/L   Chloride 107 98 - 111 mmol/L   CO2 25 22 - 32 mmol/L   Glucose, Bld 86 70 - 99 mg/dL   BUN 6 6 - 20 mg/dL   Creatinine, Ser 1.04 0.61 - 1.24 mg/dL   Calcium 8.5 (L) 8.9 - 10.3 mg/dL   Total Protein 7.8 6.5 - 8.1 g/dL   Albumin 3.5 3.5 - 5.0 g/dL   AST 57 (H) 15 - 41 U/L   ALT 33 0 - 44 U/L   Alkaline Phosphatase 70 38 - 126 U/L   Total Bilirubin 0.3 0.3 - 1.2 mg/dL   GFR calc non Af Amer >60 >60 mL/min   GFR calc Af Amer >60 >60 mL/min   Anion gap 8 5 - 15      Assessment & Plan:   Encounter Diagnoses  Name Primary?  . Essential hypertension, benign Yes  . Hyperlipidemia, unspecified hyperlipidemia type   . Chronic obstructive pulmonary disease, unspecified COPD type ([place])   . Tobacco use disorder   . Screening for prostate cancer      -reviewed labs with pt -pt to continue current medications -counseled smoking cessation -pt to follow up 4 months.  RTO sooner prn

## 2018-07-15 ENCOUNTER — Other Ambulatory Visit (HOSPITAL_COMMUNITY)
Admission: RE | Admit: 2018-07-15 | Discharge: 2018-07-15 | Disposition: A | Discharge status: Home / Self Care | Payer: Medicaid Other | Source: Ambulatory Visit | Attending: Physician Assistant | Admitting: Physician Assistant

## 2018-07-15 DIAGNOSIS — E785 Hyperlipidemia, unspecified: Secondary | ICD-10-CM | POA: Diagnosis not present

## 2018-07-15 DIAGNOSIS — Z125 Encounter for screening for malignant neoplasm of prostate: Secondary | ICD-10-CM | POA: Diagnosis present

## 2018-07-15 DIAGNOSIS — I10 Essential (primary) hypertension: Secondary | ICD-10-CM | POA: Insufficient documentation

## 2018-07-15 LAB — LIPID PANEL
CHOL/HDL RATIO: 1.5 ratio
CHOLESTEROL: 174 mg/dL (ref 0–200)
HDL: 116 mg/dL (ref >40)
LDL Cholesterol: 47 mg/dL (ref 0–99)
Triglycerides: 53 mg/dL (ref <150)
VLDL: 11 mg/dL (ref 0–40)

## 2018-07-15 LAB — COMPREHENSIVE METABOLIC PANEL
ALK PHOS: 54 U/L (ref 38–126)
ALT: 48 U/L — AB (ref 0–44)
AST: 113 U/L — AB (ref 15–41)
Albumin: 3.6 g/dL (ref 3.5–5.0)
Anion gap: 12 (ref 5–15)
BUN: 7 mg/dL (ref 6–20)
CALCIUM: 8.6 mg/dL — AB (ref 8.9–10.3)
CO2: 20 mmol/L — AB (ref 22–32)
CREATININE: 1.01 mg/dL (ref 0.61–1.24)
Chloride: 103 mmol/L (ref 98–111)
Glucose, Bld: 74 mg/dL (ref 70–99)
Potassium: 4.4 mmol/L (ref 3.5–5.1)
Sodium: 135 mmol/L (ref 135–145)
Total Bilirubin: 0.6 mg/dL (ref 0.3–1.2)
Total Protein: 7.6 g/dL (ref 6.5–8.1)

## 2018-07-15 LAB — PSA: Prostatic Specific Antigen: 1.18 ng/mL (ref 0.00–4.00)

## 2018-07-18 ENCOUNTER — Other Ambulatory Visit: Payer: Self-pay

## 2018-07-18 ENCOUNTER — Encounter: Payer: Self-pay | Admitting: Physician Assistant

## 2018-07-18 ENCOUNTER — Ambulatory Visit: Payer: Self-pay | Admitting: Physician Assistant

## 2018-07-18 VITALS — BP 130/80 | HR 82 | Temp 97.7°F

## 2018-07-18 DIAGNOSIS — I10 Essential (primary) hypertension: Secondary | ICD-10-CM

## 2018-07-18 DIAGNOSIS — M545 Low back pain, unspecified: Secondary | ICD-10-CM

## 2018-07-18 DIAGNOSIS — E785 Hyperlipidemia, unspecified: Secondary | ICD-10-CM

## 2018-07-18 DIAGNOSIS — J449 Chronic obstructive pulmonary disease, unspecified: Secondary | ICD-10-CM

## 2018-07-18 DIAGNOSIS — F17200 Nicotine dependence, unspecified, uncomplicated: Secondary | ICD-10-CM

## 2018-07-18 MED ORDER — PREDNISONE 10 MG PO TABS: ORAL_TABLET | ORAL | 0 refills | Status: DC

## 2018-07-18 NOTE — Progress Notes (Signed)
BP 130/80 (BP Location: Left Arm, Patient Position: Sitting, Cuff Size: Normal)   Pulse 82   Temp 97.7 F (36.5 C)   SpO2 99%    Subjective:    Patient ID: [name] [name], male    DOB: 1966/03/10, 53 y.o.   MRN: [id number]  HPI: [name] [name] is a 53 y.o. male presenting on 07/18/2018 for Hypertension; Hyperlipidemia; COPD; and Back Pain (pt states he woke up about a month ago and his back started hurting. pt has been taking tylenol which helps a little.)   HPI   Chief Complaint  Patient presents with  . Hypertension  . Hyperlipidemia  . COPD  . Back Pain    pt states he woke up about a month ago and his back started hurting. pt has been taking tylenol which helps a little.    Pt is still smoking  Relevant past medical, surgical, family and social history reviewed and updated as indicated. Interim medical history since our last visit reviewed. Allergies and medications reviewed and updated.   Current Outpatient Medications:  .  acetaminophen (TYLENOL) 500 MG tablet, Take 1,000-2,000 mg by mouth daily as needed for moderate pain or headache. , Disp: , Rfl:  .  atorvastatin (LIPITOR) 20 MG tablet, TAKE 1 Tablet BY MOUTH ONCE DAILY, Disp: 90 tablet, Rfl: 0 .  DULERA 100-5 MCG/ACT AERO, INHALE 2 PUFFS BY MOUTH TWICE DAILY. RINSE MOUTH AFTER EACH USE, Disp: 39 g, Rfl: 0 .  lisinopril (PRINIVIL,ZESTRIL) 10 MG tablet, TAKE 1 Tablet BY MOUTH ONCE DAILY, Disp: 90 tablet, Rfl: 1 .  PROVENTIL HFA 108 (90 Base) MCG/ACT inhaler, INHALE 2 PUFFS BY MOUTH EVERY 6 HOURS AS NEEDED FOR COUGHING, WHEEZING, OR SHORTNESS OF BREATH, Disp: 20.1 g, Rfl: 0    Review of Systems  Constitutional: Negative for appetite change, chills, diaphoresis, fatigue, fever and unexpected weight change.  HENT: Positive for dental problem and sneezing. Negative for congestion, drooling, ear pain, facial swelling, hearing loss, mouth sores, sore throat, trouble swallowing and voice change.   Eyes: Positive for pain,  discharge, redness, itching and visual disturbance.  Respiratory: Positive for shortness of breath and wheezing. Negative for cough and choking.   Cardiovascular: Negative for chest pain, palpitations and leg swelling.  Gastrointestinal: Negative for abdominal pain, blood in stool, constipation, diarrhea and vomiting.  Endocrine: Negative for cold intolerance, heat intolerance and polydipsia.  Genitourinary: Negative for decreased urine volume, dysuria and hematuria.  Musculoskeletal: Positive for arthralgias, back pain and gait problem.  Skin: Negative for rash.  Allergic/Immunologic: Negative for environmental allergies.  Neurological: Positive for light-headedness. Negative for seizures, syncope and headaches.  Hematological: Negative for adenopathy.  Psychiatric/Behavioral: Negative for agitation, dysphoric mood and suicidal ideas. The patient is not nervous/anxious.     Per HPI unless specifically indicated above     Objective:    BP 130/80 (BP Location: Left Arm, Patient Position: Sitting, Cuff Size: Normal)   Pulse 82   Temp 97.7 F (36.5 C)   SpO2 99%   Wt Readings from Last 3 Encounters:  03/20/18 169 lb 8 oz (76.9 kg)  08/14/17 178 lb 8 oz (81 kg)  05/23/17 179 lb (81.2 kg)    Physical Exam Vitals signs reviewed.  Constitutional:      Appearance: He is well-developed.  HENT:     Head: Normocephalic and atraumatic.  Neck:     Musculoskeletal: Neck supple.  Cardiovascular:     Rate and Rhythm: Normal rate and regular rhythm.  Pulmonary:     Effort: Pulmonary effort is normal.     Breath sounds: Normal breath sounds. No wheezing.  Abdominal:     General: Bowel sounds are normal.     Palpations: Abdomen is soft.     Tenderness: There is no abdominal tenderness.  Musculoskeletal:     Lumbar back: He exhibits tenderness. He exhibits normal range of motion, no bony tenderness, no swelling, no edema and no deformity.     Comments: Mild soft tissue tenderness lumbar  back  Lymphadenopathy:     Cervical: No cervical adenopathy.  Skin:    General: Skin is warm and dry.  Neurological:     Mental Status: He is alert and oriented to person, place, and time.  Psychiatric:        Behavior: Behavior normal.     Results for orders placed or performed during the hospital encounter of 07/15/18  Lipid panel  Result Value Ref Range   Cholesterol 174 0 - 200 mg/dL   Triglycerides 53 <150 mg/dL   HDL 116 >40 mg/dL   Total CHOL/HDL Ratio 1.5 RATIO   VLDL 11 0 - 40 mg/dL   LDL Cholesterol 47 0 - 99 mg/dL  PSA  Result Value Ref Range   Prostatic Specific Antigen 1.18 0.00 - 4.00 ng/mL  Comprehensive metabolic panel  Result Value Ref Range   Sodium 135 135 - 145 mmol/L   Potassium 4.4 3.5 - 5.1 mmol/L   Chloride 103 98 - 111 mmol/L   CO2 20 (L) 22 - 32 mmol/L   Glucose, Bld 74 70 - 99 mg/dL   BUN 7 6 - 20 mg/dL   Creatinine, Ser 1.01 0.61 - 1.24 mg/dL   Calcium 8.6 (L) 8.9 - 10.3 mg/dL   Total Protein 7.6 6.5 - 8.1 g/dL   Albumin 3.6 3.5 - 5.0 g/dL   AST 113 (H) 15 - 41 U/L   ALT 48 (H) 0 - 44 U/L   Alkaline Phosphatase 54 38 - 126 U/L   Total Bilirubin 0.6 0.3 - 1.2 mg/dL   GFR calc non Af Amer >60 >60 mL/min   GFR calc Af Amer >60 >60 mL/min   Anion gap 12 5 - 15      Assessment & Plan:   Encounter Diagnoses  Name Primary?  . Chronic obstructive pulmonary disease, unspecified COPD type ([place]) Yes  . Essential hypertension, benign   . Hyperlipidemia, unspecified hyperlipidemia type   . Tobacco use disorder   . Low back pain without sciatica, unspecified back pain laterality, unspecified chronicity     -reviewed labs with pt -counseled pt to Avoid APAP and etoh -counseled smoking cessation -pt to continue current medications -rx prednisone taper for back.  Also recommend ice/heat -pt to follow up 6 months.  RTO sooner prn

## 2018-07-24 ENCOUNTER — Other Ambulatory Visit: Payer: Self-pay | Admitting: Physician Assistant

## 2018-07-24 MED ORDER — LISINOPRIL 10 MG PO TABS: 10.0000 mg | ORAL_TABLET | Freq: Every day | ORAL | 1 refills | Status: DC

## 2018-07-24 MED ORDER — ATORVASTATIN CALCIUM 20 MG PO TABS: 20.0000 mg | ORAL_TABLET | Freq: Every day | ORAL | 2 refills | Status: DC

## 2018-07-24 MED ORDER — ALBUTEROL SULFATE HFA 108 (90 BASE) MCG/ACT IN AERS: INHALATION_SPRAY | RESPIRATORY_TRACT | 1 refills | Status: DC

## 2018-07-24 MED ORDER — MOMETASONE FURO-FORMOTEROL FUM 100-5 MCG/ACT IN AERO: INHALATION_SPRAY | RESPIRATORY_TRACT | 1 refills | Status: DC

## 2018-12-17 ENCOUNTER — Ambulatory Visit: Payer: Self-pay | Admitting: Physician Assistant

## 2018-12-30 ENCOUNTER — Other Ambulatory Visit: Payer: Self-pay | Admitting: Physician Assistant

## 2018-12-30 DIAGNOSIS — I10 Essential (primary) hypertension: Secondary | ICD-10-CM

## 2018-12-30 DIAGNOSIS — E785 Hyperlipidemia, unspecified: Secondary | ICD-10-CM

## 2019-01-13 ENCOUNTER — Ambulatory Visit: Payer: Self-pay | Admitting: Physician Assistant

## 2019-02-03 ENCOUNTER — Other Ambulatory Visit: Payer: Self-pay | Admitting: Physician Assistant

## 2019-02-05 ENCOUNTER — Ambulatory Visit: Payer: Self-pay | Admitting: Physician Assistant

## 2019-02-06 ENCOUNTER — Other Ambulatory Visit (HOSPITAL_COMMUNITY)
Admission: RE | Admit: 2019-02-06 | Discharge: 2019-02-06 | Disposition: A | Discharge status: Home / Self Care | Payer: Medicaid Other | Source: Ambulatory Visit | Attending: Physician Assistant | Admitting: Physician Assistant

## 2019-02-06 DIAGNOSIS — R69 Illness, unspecified: Secondary | ICD-10-CM | POA: Diagnosis present

## 2019-02-06 DIAGNOSIS — E785 Hyperlipidemia, unspecified: Secondary | ICD-10-CM | POA: Diagnosis not present

## 2019-02-06 DIAGNOSIS — I10 Essential (primary) hypertension: Secondary | ICD-10-CM | POA: Insufficient documentation

## 2019-02-06 LAB — COMPREHENSIVE METABOLIC PANEL
ALT: 24 U/L (ref 0–44)
AST: 49 U/L — ABNORMAL HIGH (ref 15–41)
Albumin: 3.5 g/dL (ref 3.5–5.0)
Alkaline Phosphatase: 61 U/L (ref 38–126)
Anion gap: 11 (ref 5–15)
BUN: 8 mg/dL (ref 6–20)
CO2: 18 mmol/L — ABNORMAL LOW (ref 22–32)
Calcium: 8.7 mg/dL — ABNORMAL LOW (ref 8.9–10.3)
Chloride: 107 mmol/L (ref 98–111)
Creatinine, Ser: 0.97 mg/dL (ref 0.61–1.24)
GFR calc Af Amer: >60 mL/min (ref >60)
GFR calc non Af Amer: >60 mL/min (ref >60)
Glucose, Bld: 161 mg/dL — ABNORMAL HIGH (ref 70–99)
Potassium: 3.7 mmol/L (ref 3.5–5.1)
Sodium: 136 mmol/L (ref 135–145)
Total Bilirubin: 0.3 mg/dL (ref 0.3–1.2)
Total Protein: 7.3 g/dL (ref 6.5–8.1)

## 2019-02-06 LAB — LIPID PANEL
Cholesterol: 174 mg/dL (ref 0–200)
HDL: 91 mg/dL (ref >40)
LDL Cholesterol: 73 mg/dL (ref 0–99)
Total CHOL/HDL Ratio: 1.9 RATIO
Triglycerides: 48 mg/dL (ref <150)
VLDL: 10 mg/dL (ref 0–40)

## 2019-02-11 ENCOUNTER — Encounter: Payer: Self-pay | Admitting: Physician Assistant

## 2019-02-11 ENCOUNTER — Ambulatory Visit: Payer: Self-pay | Admitting: Physician Assistant

## 2019-02-11 VITALS — BP 108/72 | HR 50 | Temp 97.5°F | Wt 169.0 lb

## 2019-02-11 DIAGNOSIS — J449 Chronic obstructive pulmonary disease, unspecified: Secondary | ICD-10-CM

## 2019-02-11 DIAGNOSIS — R739 Hyperglycemia, unspecified: Secondary | ICD-10-CM

## 2019-02-11 DIAGNOSIS — F17200 Nicotine dependence, unspecified, uncomplicated: Secondary | ICD-10-CM

## 2019-02-11 DIAGNOSIS — Z131 Encounter for screening for diabetes mellitus: Secondary | ICD-10-CM

## 2019-02-11 DIAGNOSIS — E785 Hyperlipidemia, unspecified: Secondary | ICD-10-CM

## 2019-02-11 DIAGNOSIS — I10 Essential (primary) hypertension: Secondary | ICD-10-CM

## 2019-02-11 LAB — GLUCOSE, POCT (MANUAL RESULT ENTRY): POC Glucose: 122 mg/dl — AB (ref 70–99)

## 2019-02-11 MED ORDER — ALBUTEROL SULFATE HFA 108 (90 BASE) MCG/ACT IN AERS: INHALATION_SPRAY | RESPIRATORY_TRACT | 1 refills | Status: DC

## 2019-02-11 MED ORDER — DULERA 200-5 MCG/ACT IN AERO: 2.0000 | INHALATION_SPRAY | Freq: Two times a day (BID) | RESPIRATORY_TRACT | 3 refills | Status: DC

## 2019-02-11 MED ORDER — ATORVASTATIN CALCIUM 20 MG PO TABS: 20.0000 mg | ORAL_TABLET | Freq: Every day | ORAL | 2 refills | Status: DC

## 2019-02-11 NOTE — Progress Notes (Signed)
BP 108/72   Pulse (!) 50   Temp (!) 97.5 F (36.4 C)   Wt 169 lb (76.7 kg)   SpO2 99%   BMI 22.92 kg/m    Subjective:    Patient ID: [name] [name], male    DOB: February 18, 1966, 53 y.o.   MRN: [id number]  HPI: [name] [name] is a 53 y.o. male presenting on 02/11/2019 for No chief complaint on file.   HPI   Pt had a negative covid 19 screening questionnaire    Patient says he is doing well however he is continuing to smoke.  Patient says he is using his rescue inhaler 2 or 3 times on most days.  He is also using his Dulera inhaler twice daily.  Patient says he is feeling fine except for his breathing issues.  Relevant past medical, surgical, family and social history reviewed and updated as indicated. Interim medical history since our last visit reviewed. Allergies and medications reviewed and updated.   Current Outpatient Medications:  .  albuterol (PROVENTIL HFA) 108 (90 Base) MCG/ACT inhaler, INHALE 2 PUFFS BY MOUTH EVERY 6 HOURS AS NEEDED FOR COUGHING, WHEEZING, OR SHORTNESS OF BREATH, Disp: 20.1 g, Rfl: 1 .  atorvastatin (LIPITOR) 20 MG tablet, Take 1 tablet (20 mg total) by mouth daily., Disp: 90 tablet, Rfl: 2 .  lisinopril (ZESTRIL) 10 MG tablet, TAKE 1 Tablet BY MOUTH EVERY DAY, Disp: 90 tablet, Rfl: 1 .  mometasone-formoterol (DULERA) 100-5 MCG/ACT AERO, INHALE 2 PUFFS BY MOUTH TWICE DAILY. RINSE MOUTH AFTER EACH USE, Disp: 39 g, Rfl: 1    Review of Systems  Per HPI unless specifically indicated above     Objective:    BP 108/72   Pulse (!) 50   Temp (!) 97.5 F (36.4 C)   Wt 169 lb (76.7 kg)   SpO2 99%   BMI 22.92 kg/m   Wt Readings from Last 3 Encounters:  02/11/19 169 lb (76.7 kg)  03/20/18 169 lb 8 oz (76.9 kg)  08/14/17 178 lb 8 oz (81 kg)    Physical Exam Vitals signs reviewed.  Constitutional:      General: He is not in acute distress.    Appearance: He is well-developed. He is not ill-appearing.  HENT:     Head: Normocephalic and atraumatic.   Neck:     Musculoskeletal: Neck supple.  Cardiovascular:     Rate and Rhythm: Normal rate and regular rhythm.  Pulmonary:     Effort: Pulmonary effort is normal.     Breath sounds: Normal breath sounds. No wheezing.  Abdominal:     General: Bowel sounds are normal.     Palpations: Abdomen is soft.     Tenderness: There is no abdominal tenderness.  Musculoskeletal:     Right lower leg: No edema.     Left lower leg: No edema.  Lymphadenopathy:     Cervical: No cervical adenopathy.  Skin:    General: Skin is warm and dry.  Neurological:     Mental Status: He is alert and oriented to person, place, and time.  Psychiatric:        Attention and Perception: Attention normal.        Speech: Speech normal.        Behavior: Behavior normal. Behavior is cooperative.     Results for orders placed or performed in visit on 02/11/19  POCT Glucose (CBG)  Result Value Ref Range   POC Glucose 122 (A) 70 - 99 mg/dl  Assessment & Plan:    Encounter Diagnoses  Name Primary?  . Chronic obstructive pulmonary disease, unspecified COPD type ([place]) Yes  . Essential hypertension, benign   . Elevated blood sugar level   . Hyperlipidemia, unspecified hyperlipidemia type   . Tobacco use disorder       -Reviewed labs done 02/06/2019 with patient.  Discussed elevated glucose with lab work.  Random blood sugar checked today was 122.  -Patient is counseled to watch sweets/carbs  -Patient is counseled on smoking cessation.  Discussed with patient that we can adjust his medications but they may or may not be able to help his breathing if he continues to smoke.  -Will increase dulera to 200  -Patient to continue current medications for blood pressure and cholesterol.  -Pt has no computer or smart phone  -Patient was scheduled for influenza vaccination.  -Patient is encouraged to wear a mask when around other people to reduce risk of coronavirus transmission per CDC  guidelines.  -Patient  to follow-up in 3 months.  Patient is to contact office sooner for any problems prior to that time.

## 2019-05-27 ENCOUNTER — Other Ambulatory Visit: Payer: Self-pay

## 2019-05-27 ENCOUNTER — Other Ambulatory Visit (HOSPITAL_COMMUNITY)
Admission: RE | Admit: 2019-05-27 | Discharge: 2019-05-27 | Disposition: A | Discharge status: Home / Self Care | Payer: Medicaid Other | Source: Ambulatory Visit | Attending: Physician Assistant | Admitting: Physician Assistant

## 2019-05-27 DIAGNOSIS — R739 Hyperglycemia, unspecified: Secondary | ICD-10-CM | POA: Diagnosis not present

## 2019-05-27 DIAGNOSIS — E785 Hyperlipidemia, unspecified: Secondary | ICD-10-CM

## 2019-05-27 DIAGNOSIS — R7309 Other abnormal glucose: Secondary | ICD-10-CM | POA: Diagnosis present

## 2019-05-27 DIAGNOSIS — Z131 Encounter for screening for diabetes mellitus: Secondary | ICD-10-CM

## 2019-05-27 DIAGNOSIS — I10 Essential (primary) hypertension: Secondary | ICD-10-CM | POA: Diagnosis present

## 2019-05-27 LAB — LIPID PANEL
Cholesterol: 145 mg/dL (ref 0–200)
HDL: 71 mg/dL (ref >40)
LDL Cholesterol: 63 mg/dL (ref 0–99)
Total CHOL/HDL Ratio: 2 RATIO
Triglycerides: 53 mg/dL (ref <150)
VLDL: 11 mg/dL (ref 0–40)

## 2019-05-27 LAB — COMPREHENSIVE METABOLIC PANEL
ALT: 26 U/L (ref 0–44)
AST: 50 U/L — ABNORMAL HIGH (ref 15–41)
Albumin: 3.1 g/dL — ABNORMAL LOW (ref 3.5–5.0)
Alkaline Phosphatase: 80 U/L (ref 38–126)
Anion gap: 11 (ref 5–15)
BUN: 11 mg/dL (ref 6–20)
CO2: 24 mmol/L (ref 22–32)
Calcium: 8.4 mg/dL — ABNORMAL LOW (ref 8.9–10.3)
Chloride: 100 mmol/L (ref 98–111)
Creatinine, Ser: 0.99 mg/dL (ref 0.61–1.24)
GFR calc Af Amer: >60 mL/min (ref >60)
GFR calc non Af Amer: >60 mL/min (ref >60)
Glucose, Bld: 76 mg/dL (ref 70–99)
Potassium: 3.8 mmol/L (ref 3.5–5.1)
Sodium: 135 mmol/L (ref 135–145)
Total Bilirubin: 1 mg/dL (ref 0.3–1.2)
Total Protein: 7.6 g/dL (ref 6.5–8.1)

## 2019-05-27 LAB — HEMOGLOBIN A1C
Hgb A1c MFr Bld: 5.7 % — ABNORMAL HIGH (ref 4.8–5.6)
Mean Plasma Glucose: 116.89 mg/dL

## 2019-06-02 ENCOUNTER — Ambulatory Visit: Payer: Medicaid Other | Admitting: Physician Assistant

## 2019-06-02 ENCOUNTER — Encounter: Payer: Self-pay | Admitting: Physician Assistant

## 2019-06-02 ENCOUNTER — Other Ambulatory Visit: Payer: Self-pay

## 2019-06-02 VITALS — BP 142/88 | HR 86 | Temp 97.7°F | Wt 163.0 lb

## 2019-06-02 DIAGNOSIS — R634 Abnormal weight loss: Secondary | ICD-10-CM

## 2019-06-02 DIAGNOSIS — J449 Chronic obstructive pulmonary disease, unspecified: Secondary | ICD-10-CM

## 2019-06-02 DIAGNOSIS — E785 Hyperlipidemia, unspecified: Secondary | ICD-10-CM

## 2019-06-02 DIAGNOSIS — I10 Essential (primary) hypertension: Secondary | ICD-10-CM

## 2019-06-02 DIAGNOSIS — R0989 Other specified symptoms and signs involving the circulatory and respiratory systems: Secondary | ICD-10-CM

## 2019-06-02 DIAGNOSIS — F17200 Nicotine dependence, unspecified, uncomplicated: Secondary | ICD-10-CM

## 2019-06-02 MED ORDER — ATORVASTATIN CALCIUM 20 MG PO TABS: 20.0000 mg | ORAL_TABLET | Freq: Every day | ORAL | 2 refills | Status: DC

## 2019-06-02 MED ORDER — DULERA 200-5 MCG/ACT IN AERO: 2.0000 | INHALATION_SPRAY | Freq: Two times a day (BID) | RESPIRATORY_TRACT | 3 refills | Status: DC

## 2019-06-02 MED ORDER — LISINOPRIL 20 MG PO TABS: 20.0000 mg | ORAL_TABLET | Freq: Every day | ORAL | 3 refills | Status: DC

## 2019-06-02 MED ORDER — ALBUTEROL SULFATE HFA 108 (90 BASE) MCG/ACT IN AERS: INHALATION_SPRAY | RESPIRATORY_TRACT | 1 refills | Status: DC

## 2019-06-02 NOTE — Progress Notes (Signed)
BP (!) 142/88   Pulse 86   Temp 97.7 F (36.5 C)   Wt 163 lb (73.9 kg)   SpO2 97%   BMI 22.11 kg/m    Subjective:    Patient ID: [name] [name], male    DOB: 25-May-1965, 54 y.o.   MRN: [id number]  HPI: [name] [name] is a 54 y.o. male presenting on 06/02/2019 for No chief complaint on file.   HPI   [name] [name] had a negative covid 62 screening questionnaire   [name] [name] is 36yoM with copd, htn, dyslipidemia.  He presents today for routine follow up.  He says he is doing well and he has no complaints today.      Relevant past medical, surgical, family and social history reviewed and updated as indicated. Interim medical history since our last visit reviewed. Allergies and medications reviewed and updated.   Current Outpatient Medications:  .  albuterol (PROVENTIL HFA) 108 (90 Base) MCG/ACT inhaler, INHALE 2 PUFFS BY MOUTH EVERY 6 HOURS AS NEEDED FOR COUGHING, WHEEZING, OR SHORTNESS OF BREATH, Disp: 20.1 g, Rfl: 1 .  atorvastatin (LIPITOR) 20 MG tablet, Take 1 tablet (20 mg total) by mouth daily., Disp: 90 tablet, Rfl: 2 .  lisinopril (ZESTRIL) 10 MG tablet, TAKE 1 Tablet BY MOUTH EVERY DAY, Disp: 90 tablet, Rfl: 1 .  mometasone-formoterol (DULERA) 200-5 MCG/ACT AERO, Inhale 2 puffs into the lungs 2 (two) times daily., Disp: 39 g, Rfl: 3    Review of Systems  Per HPI unless specifically indicated above     Objective:    BP (!) 142/88   Pulse 86   Temp 97.7 F (36.5 C)   Wt 163 lb (73.9 kg)   SpO2 97%   BMI 22.11 kg/m   Wt Readings from Last 3 Encounters:  06/02/19 163 lb (73.9 kg)  02/11/19 169 lb (76.7 kg)  03/20/18 169 lb 8 oz (76.9 kg)    Physical Exam Constitutional:      General: He is not in acute distress.    Appearance: He is not toxic-appearing.  HENT:     Head: Normocephalic and atraumatic.  Cardiovascular:     Rate and Rhythm: Normal rate and regular rhythm.     Heart sounds: Normal heart sounds.  Pulmonary:     Effort: Pulmonary effort is normal. No respiratory  distress.     Breath sounds: Wheezing (occ scattered exp) and rhonchi (LLL) present.  Abdominal:     General: Abdomen is flat. Bowel sounds are normal. There is no distension.     Palpations: Abdomen is soft. There is no mass.     Tenderness: There is no abdominal tenderness.  Musculoskeletal:     Cervical back: Neck supple.  Lymphadenopathy:     Cervical: No cervical adenopathy.  Neurological:     Mental Status: He is alert and oriented to person, place, and time.  Psychiatric:        Attention and Perception: Attention normal.        Mood and Affect: Mood normal.        Speech: Speech normal.        Behavior: Behavior normal. Behavior is cooperative.     Comments: Very pleasant and in good spiritis.        Results for orders placed or performed during the hospital encounter of 05/27/19  Hemoglobin A1c  Result Value Ref Range   Hgb A1c MFr Bld 5.7 (H) 4.8 - 5.6 %   Mean Plasma Glucose 116.89 mg/dL  Lipid panel  Result Value Ref Range   Cholesterol 145 0 - 200 mg/dL   Triglycerides 53 <150 mg/dL   HDL 71 >40 mg/dL   Total CHOL/HDL Ratio 2.0 RATIO   VLDL 11 0 - 40 mg/dL   LDL Cholesterol 63 0 - 99 mg/dL  Comprehensive metabolic panel  Result Value Ref Range   Sodium 135 135 - 145 mmol/L   Potassium 3.8 3.5 - 5.1 mmol/L   Chloride 100 98 - 111 mmol/L   CO2 24 22 - 32 mmol/L   Glucose, Bld 76 70 - 99 mg/dL   BUN 11 6 - 20 mg/dL   Creatinine, Ser 0.99 0.61 - 1.24 mg/dL   Calcium 8.4 (L) 8.9 - 10.3 mg/dL   Total Protein 7.6 6.5 - 8.1 g/dL   Albumin 3.1 (L) 3.5 - 5.0 g/dL   AST 50 (H) 15 - 41 U/L   ALT 26 0 - 44 U/L   Alkaline Phosphatase 80 38 - 126 U/L   Total Bilirubin 1.0 0.3 - 1.2 mg/dL   GFR calc non Af Amer >60 >60 mL/min   GFR calc Af Amer >60 >60 mL/min   Anion gap 11 5 - 15      Assessment & Plan:   Encounter Diagnoses  Name Primary?  . Chronic obstructive pulmonary disease, unspecified COPD type ([place]) Yes  . Essential hypertension, benign   .  Hyperlipidemia, unspecified hyperlipidemia type   . Tobacco use disorder   . Weight loss   . Abnormal lung sounds       1. htn Increase lisinopril  2. Dyslipidemia Well controlled.  Continue lipitor and lowfat diet  3. COPD/tobacco use disorder [name] [name] to continue with dulera and albuterol.  He is encouraged to stop smoking   4. Weight loss Discussed with [name] [name].  He says he is only eating 1/day due to financial issues.  he is given nutritional supplements (variety of boost, glucerna, etc).  In light of long history of smoking and abnormal chest sounds, will get CT chest- (wt loss, copd , smoking, abn lung sounds).  Will get care connect to check on financial assistance before placing order.    [name] [name] to follow up 3 months.  He is to contact office sooner prn

## 2019-06-09 ENCOUNTER — Telehealth: Payer: Self-pay | Admitting: Physician Assistant

## 2019-06-09 NOTE — Telephone Encounter (Signed)
Mr Gargus was called to make sure that he was aware that he now has medicaid and he says that yes he just found out.  Discussed that he is now ineligible for care at [facility] which he says he understands.  Discussed that he needs to schedule new patient appointment with new PCP.  Reminded pt to discuss with new PCP possible CT chest as was discussed at last OV due to weight loss and smoking and abnormal breath sounds.  Also made sure pt aware that he needs to transfer rx to local pharmacy as he is no longer eligible to receive free meds through [facility].  Pt states understanding.

## 2019-09-01 ENCOUNTER — Ambulatory Visit: Payer: Medicaid Other | Admitting: Physician Assistant

## 2019-11-06 ENCOUNTER — Ambulatory Visit: Payer: Medicaid Other | Admitting: Urology

## 2020-04-05 ENCOUNTER — Encounter: Payer: Self-pay | Admitting: Podiatry

## 2020-04-05 ENCOUNTER — Ambulatory Visit (INDEPENDENT_AMBULATORY_CARE_PROVIDER_SITE_OTHER): Payer: Medicaid Other

## 2020-04-05 ENCOUNTER — Ambulatory Visit (INDEPENDENT_AMBULATORY_CARE_PROVIDER_SITE_OTHER): Payer: Medicaid Other | Admitting: Podiatry

## 2020-04-05 ENCOUNTER — Other Ambulatory Visit: Payer: Self-pay

## 2020-04-05 DIAGNOSIS — M79672 Pain in left foot: Secondary | ICD-10-CM

## 2020-04-05 DIAGNOSIS — M79671 Pain in right foot: Secondary | ICD-10-CM

## 2020-04-05 DIAGNOSIS — M2041 Other hammer toe(s) (acquired), right foot: Secondary | ICD-10-CM

## 2020-04-05 DIAGNOSIS — B351 Tinea unguium: Secondary | ICD-10-CM | POA: Diagnosis not present

## 2020-04-05 DIAGNOSIS — M79675 Pain in left toe(s): Secondary | ICD-10-CM

## 2020-04-05 DIAGNOSIS — M79674 Pain in right toe(s): Secondary | ICD-10-CM | POA: Diagnosis not present

## 2020-04-05 NOTE — Progress Notes (Signed)
Subjective:   Patient ID: [name] [name], male   DOB: 54 y.o.   MRN: [id number]   HPI Patient presents stating he has severe lesions right severe nail disease 1-5 both feet overall foot pain and history of neuropathy.  He is not in good health does not take good care of himself   Review of Systems  All other systems reviewed and are negative.       Objective:  Physical Exam Vitals and nursing note reviewed.  Constitutional:      Appearance: He is well-developed.  Pulmonary:     Effort: Pulmonary effort is normal.  Musculoskeletal:        General: Normal range of motion.  Skin:    General: Skin is warm.  Neurological:     Mental Status: He is alert.     Neurovascular status intact muscle strength adequate range of motion adequate with patient found to have severe keratotic lesion subfirst fifth metatarsal right and severely thickened yellow brittle toenails that are painful and he cannot cut himself.  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

## 2020-04-13 ENCOUNTER — Other Ambulatory Visit: Payer: Self-pay | Admitting: Podiatry

## 2020-04-13 DIAGNOSIS — M79675 Pain in left toe(s): Secondary | ICD-10-CM

## 2020-05-24 ENCOUNTER — Encounter: Payer: Self-pay | Admitting: Internal Medicine

## 2020-07-07 ENCOUNTER — Telehealth: Payer: Self-pay | Admitting: *Deleted

## 2020-07-07 ENCOUNTER — Encounter: Payer: Self-pay | Admitting: *Deleted

## 2020-07-07 ENCOUNTER — Other Ambulatory Visit: Payer: Self-pay

## 2020-07-07 ENCOUNTER — Encounter: Payer: Self-pay | Admitting: Gastroenterology

## 2020-07-07 ENCOUNTER — Ambulatory Visit (INDEPENDENT_AMBULATORY_CARE_PROVIDER_SITE_OTHER): Payer: Medicaid Other | Admitting: Gastroenterology

## 2020-07-07 VITALS — BP 115/82 | HR 99 | Temp 98.0°F | Ht 72.0 in | Wt 177.6 lb

## 2020-07-07 DIAGNOSIS — R630 Anorexia: Secondary | ICD-10-CM

## 2020-07-07 DIAGNOSIS — R634 Abnormal weight loss: Secondary | ICD-10-CM | POA: Insufficient documentation

## 2020-07-07 DIAGNOSIS — R601 Generalized edema: Secondary | ICD-10-CM

## 2020-07-07 DIAGNOSIS — R7989 Other specified abnormal findings of blood chemistry: Secondary | ICD-10-CM

## 2020-07-07 DIAGNOSIS — R945 Abnormal results of liver function studies: Secondary | ICD-10-CM

## 2020-07-07 MED ORDER — PANTOPRAZOLE SODIUM 40 MG PO TBEC: 40.0000 mg | DELAYED_RELEASE_TABLET | Freq: Every day | ORAL | 3 refills | Status: DC

## 2020-07-07 NOTE — Telephone Encounter (Signed)
Called pt and made aware of pre-op/covid appt details. He voiced understanding and letter also mailed.

## 2020-07-07 NOTE — Patient Instructions (Addendum)
We are arranging an upper endoscopy with dilation as soon as possible. Make sure to stick with soft foods and drink plenty of liquids to stay hydrated.   Avoid adding any extra salt to foods. No fluid restriction right now.   I have sent in a medication called Protonix to start taking once each morning on an empty stomach, 30 minutes before food.   Great work on staying away from alcohol! Continue to avoid this going forward (forever) as drinking can worsen your liver disease and cause worsening symptoms and even death.   Please have blood work done.  We have also ordered an ultrasound of your liver.  We will see you in 4-6 weeks for close follow-up.   It was a pleasure to see you today. I want to create trusting relationships with patients to provide genuine, compassionate, and quality care. I value your feedback. If you receive a survey regarding your visit,  I greatly appreciate you taking time to fill this out.   Annitta Needs, PhD, ANP-BC [facility]

## 2020-07-07 NOTE — Progress Notes (Signed)
Cc'ed to pcp °

## 2020-07-07 NOTE — Progress Notes (Signed)
Primary Care Physician:  Rosita Fire, MD  Referring Physician: Dr. Legrand Rams Primary Gastroenterologist:  Dr. Gala Romney  Chief Complaint  Patient presents with  . alcoholic liver disease    Stopped drinking 1 month ago. Had drank heavy at times in past. Abd and LE swelling  . loss of appetite    HPI:   [name] [name] is a 55 y.o. male presenting today at the request of Dr. Legrand Rams due to alcoholic liver disease and loss of appetite. He was last seen in 2018 with elevated LFTs, US abdomen with fatty liver, work-up with iron sats 57, ferritin 308 (in setting of alcohol), Hep B surface antigen negative, Hep C antibody negative. Colonoscopy on file from 2018 with tubular adenomas and surveillance due in 2023.   Outside labs from 06/19/20 with creatinine 1.23, BUN 6, Albumin 2.3, Tbili 0.8, Direct 0.4, Alk Phos 173, AST 60, ALT 26, WBC count 14.3, Hgb 11.6, Hct 34.4, platelets normal at 332. No updated [place] recently.    New onset lower extremity edema X 3 weeks. No abdominal distension. No abdominal pain. Occasional vomiting. Feels like food goes down and comes straight back up. No appetite. Fried foods, salad comes back up. Symptoms of regurgitation about a month. Broth and soup stays down. Soft food stays down. Ensure. No fever or chills. No typical GERD symptoms. No melena. No hematochezia. No prior EGD. No ETOH in one month. Prior to this drank a 40 daily since he was about 20. Started on spironolactone twice a day (25 mg BID) in January. Takes Ibuprofen for pain.   States normally around 175-180 today 177.   Past Medical History:  Diagnosis Date  . Asthma   . Dyspnea   . High cholesterol   . History of kidney stones   . Hypertension   . Kidney stones     Past Surgical History:  Procedure Laterality Date  . APPENDECTOMY    . COLONOSCOPY WITH PROPOFOL N/A 04/18/2017   non-bleeding internal hemorrhoids, two 4-6 mm polyps in descending colon and cecum, pancolonic diverticulosis, single  cecal AVM. Tubular adenomas, surveillance in 2023.   Marland Kitchen FRACTURE SURGERY     left arm  . POLYPECTOMY  04/18/2017   Procedure: POLYPECTOMY;  Surgeon: Daneil Dolin, MD;  Location: [facility];  Service: Endoscopy;;    Current Outpatient Medications  Medication Sig Dispense Refill  . albuterol (PROVENTIL HFA) 108 (90 Base) MCG/ACT inhaler INHALE 2 PUFFS BY MOUTH EVERY 6 HOURS AS NEEDED FOR COUGHING, WHEEZING, OR SHORTNESS OF BREATH 20.1 g 1  . amLODipine (NORVASC) 10 MG tablet Take 10 mg by mouth daily.    Marland Kitchen atorvastatin (LIPITOR) 20 MG tablet Take 1 tablet (20 mg total) by mouth daily. 90 tablet 2  . baclofen (LIORESAL) 10 MG tablet Take 10 mg by mouth 3 (three) times daily.    Marland Kitchen lisinopril (ZESTRIL) 10 MG tablet Take 10 mg by mouth daily.    . mometasone-formoterol (DULERA) 200-5 MCG/ACT AERO Inhale 2 puffs into the lungs 2 (two) times daily. 39 g 3  . pantoprazole (PROTONIX) 40 MG tablet Take 1 tablet (40 mg total) by mouth daily. Take 30 minutes 30 tablet 3  . potassium chloride SA (KLOR-CON) 20 MEQ tablet Take 20 mEq by mouth daily.    Marland Kitchen spironolactone (ALDACTONE) 25 MG tablet Take 25 mg by mouth 2 (two) times daily.     No current facility-administered medications for this visit.    Allergies as  of 07/07/2020  . (No Known Allergies)    Family History  Problem Relation Age of Onset  . Cancer Father        throat  . Diabetes Sister   . Colon cancer Neg Hx     Social History   Socioeconomic History  . Marital status: Single    Spouse name: Not on file  . Number of children: Not on file  . Years of education: Not on file  . Highest education level: Not on file  Occupational History  . Not on file  Tobacco Use  . Smoking status: Current Every Day Smoker    Packs/day: 0.50    Years: 33.00    Pack years: 16.50    Types: Cigarettes  . Smokeless tobacco: Never Used  Vaping Use  . Vaping Use: Never used  Substance and Sexual Activity  . Alcohol use: No    Comment:  history of ETOH use 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 20s, no ETOH for about 4 weeks as of 07/07/20  . Drug use: No  . Sexual activity: Yes    Birth control/protection: None  Other Topics Concern  . Not on file  Social History Narrative  . Not on file   Social Determinants of Health   Financial Resource Strain: Not on file  Food Insecurity: Not on file  Transportation Needs: Not on file  Physical Activity: Not on file  Stress: Not on file  Social Connections: Not on file  Intimate Partner Violence: Not on file    Review of Systems: Gen: see HPI CV: Denies chest pain, heart palpitations, peripheral edema, syncope.  Resp: Denies shortness of breath at rest or with exertion. Denies wheezing or cough.  GI: see HPI GU : Denies urinary burning, urinary frequency, urinary hesitancy MS: Denies joint pain, muscle weakness, cramps, or limitation of movement.  Derm: Denies rash, itching, dry skin Psych: Denies depression, anxiety, memory loss, and confusion Heme: Denies bruising, bleeding, and enlarged lymph nodes.  Physical Exam: BP 115/82   Pulse 99   Temp 98 F (36.7 C) (Temporal)   Ht 6' (1.829 m)   Wt 177 lb 9.6 oz (80.6 kg)   BMI 24.09 kg/m  General:   Alert and oriented. Pleasant and cooperative. Thin but not cachectic. Upper extremities thin.  Head:  Normocephalic and atraumatic. Eyes:  Without icterus, sclera clear and conjunctiva pink.  Ears:  Normal auditory acuity. Mouth:  Mask in place Lungs:  Clear to auscultation bilaterally. No wheezes, rales, or rhonchi. No distress.  Heart:  S1, S2 present without murmurs appreciated.  Abdomen:  +BS, soft, full but without obvious tense ascites. Non-tender. +hepatomegaly with liver margin palpable below right costal margin and crossing midline  Rectal:  Deferred  Msk:  Symmetrical without gross deformities. Normal posture. Extremities:  2-3+ pitting edema up to thigh, pedal edema Neurologic:  Alert and  oriented x4;  grossly  normal neurologically. Skin:  Intact without significant lesions or rashes. Psych:  Alert and cooperative. Normal mood and affect.  ASSESSMENT: [name] [name] is a 55 y.o. male presenting today at the request of Dr. Legrand Rams due to concerns for decompensated liver disease in setting of chronic alcohol use, weight loss, lack of appetite, and vomiting.  Elevated LFTs: pattern consistent with ETOH. Prior US with fatty liver in 2018. Hepatomegaly noted on exam and clinically appears to be dealing decompensated liver disease. He has no obvious tense ascites on exam but does have pitting edema to thigh  in setting of hypoalbuminemia. Started on spironolactone by PCP. Will need to add lasix after review of updated BMP. Will also likely be able to stop supplemental potassium thereafter but will await labs. Updating US abdomen now. He was applauded on avoidance of ETOH for past month. Check additional labs to include updated HFP, INR, AFP. Prior Hep B and C negative in 2018.   Vomiting: appears to be moreso regurgitating foods other than soft foods and liquids. No nausea or abdominal pain. No typical GERD symptoms. Starting PPI and pursuing EGD/dilation in the near future. He also has been taking NSAIDs for joint pain, which I have asked him to stop as well.   Weight loss: no significant weight change after review of Epic and reports his baseline is around 175-180 but he does have notable lower extremity edema which likely is culprit for scale change. Thin upper extremities but not cachectic.   Normocytic anemia: suspect chronic disease. Update iron studies. Colonoscopy on file from 2018 with adenomas and due for surveillance 2023. No overt GI bleeding. Due to upper GI symptoms, pursuing EGD now. IF evidence for IDA, will circle back to colonoscopy.    PLAN:  US abdomen complete  Proceed with upper endoscopy/dilation by Dr. Gala Romney in near future with Propofol: the risks, benefits, and alternatives have been  discussed with the patient in detail. The patient states understanding and desires to proceed.   HFP, INR, AFP, BMP, iron studies  Start Protonix once daily  Stick with soft foods, liquids  ETOH abstinence indefinitely  Return in 4-6 weeks for close follow-up   Annitta Needs, PhD, ANP-BC [facility]

## 2020-07-14 ENCOUNTER — Ambulatory Visit (HOSPITAL_COMMUNITY)
Admission: RE | Admit: 2020-07-14 | Discharge: 2020-07-14 | Disposition: A | Discharge status: Home / Self Care | Payer: Medicaid Other | Source: Ambulatory Visit | Attending: Gastroenterology | Admitting: Gastroenterology

## 2020-07-14 ENCOUNTER — Other Ambulatory Visit: Payer: Self-pay

## 2020-07-14 DIAGNOSIS — R945 Abnormal results of liver function studies: Secondary | ICD-10-CM | POA: Insufficient documentation

## 2020-07-14 DIAGNOSIS — R7989 Other specified abnormal findings of blood chemistry: Secondary | ICD-10-CM

## 2020-07-14 IMAGING — US US ABDOMEN COMPLETE
1 series · 13 of 25 positions shown · non-contrast
Comparison: Ultrasound [DATE]

CLINICAL DATA: Elevated LFT

EXAM:
ABDOMEN ULTRASOUND COMPLETE

[Series 1: us abdomen complete · 13 of 187 slices shown]
[im 1/187]
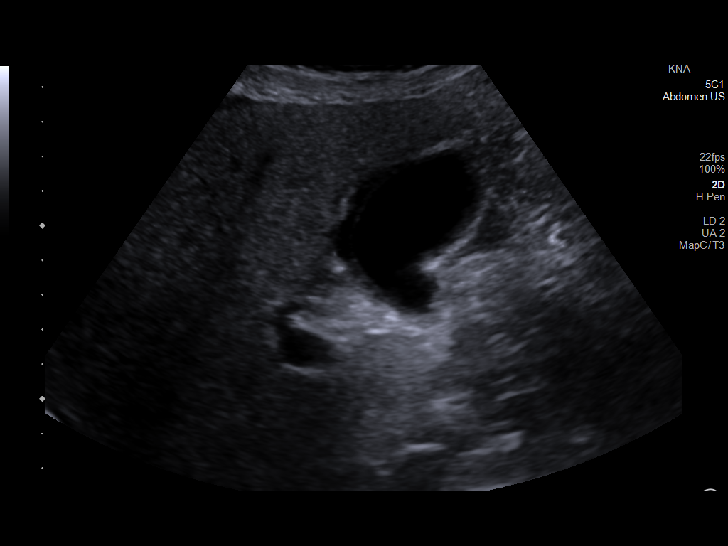
[im 16/187]
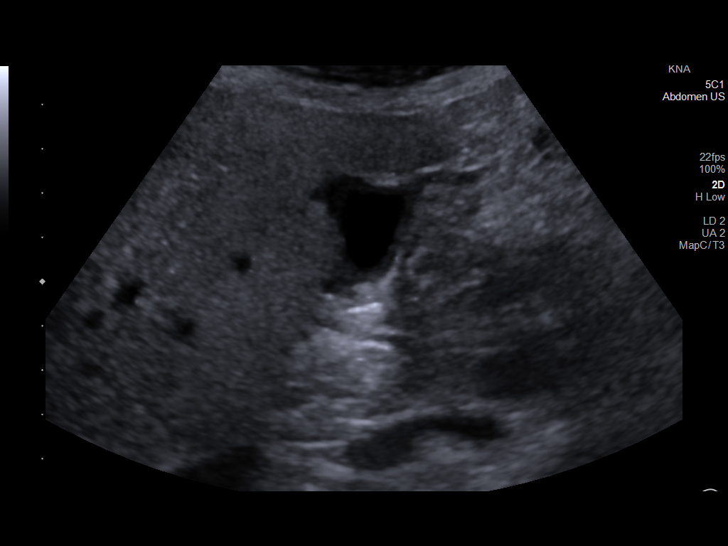
[im 32/187]
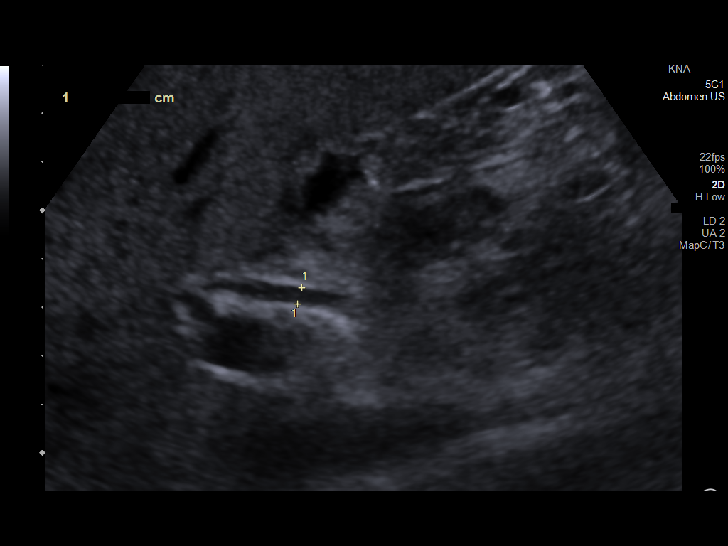
[im 47/187]
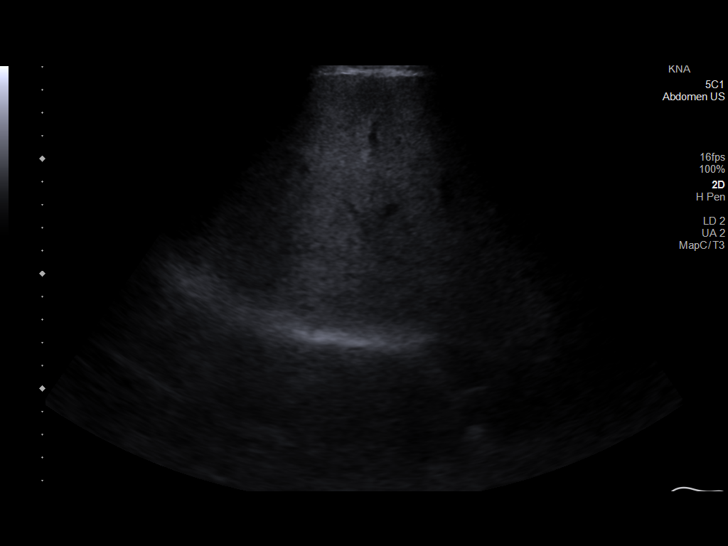
[im 63/187]
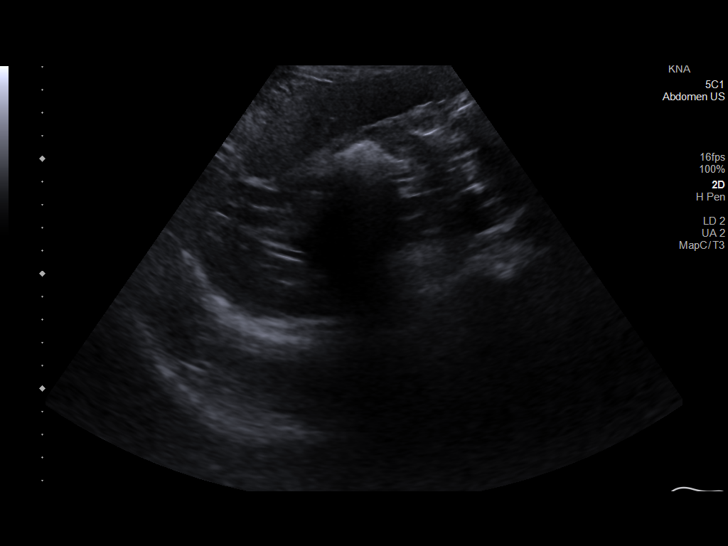
[im 78/187]
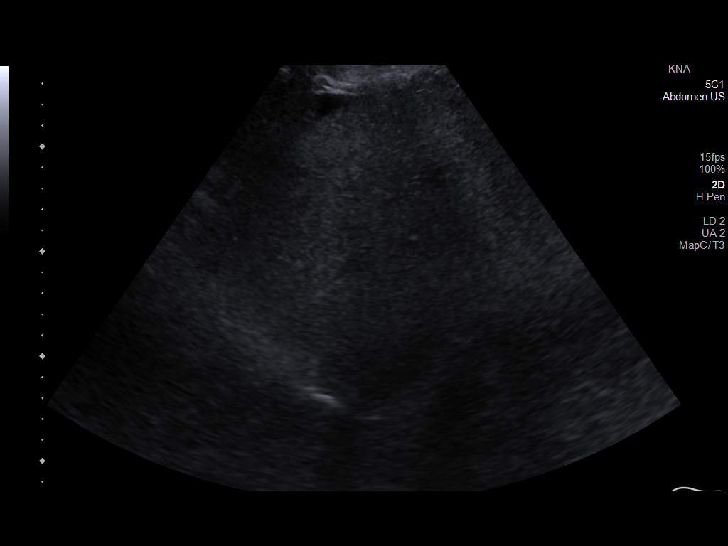
[im 94/187]
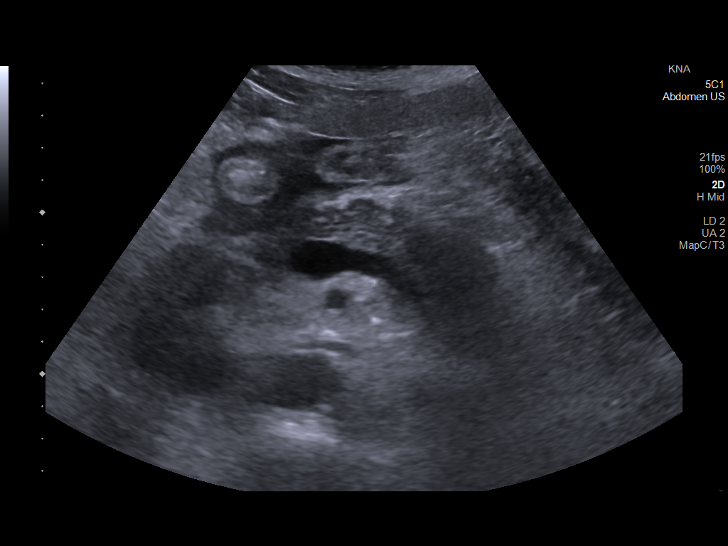
[im 109/187]
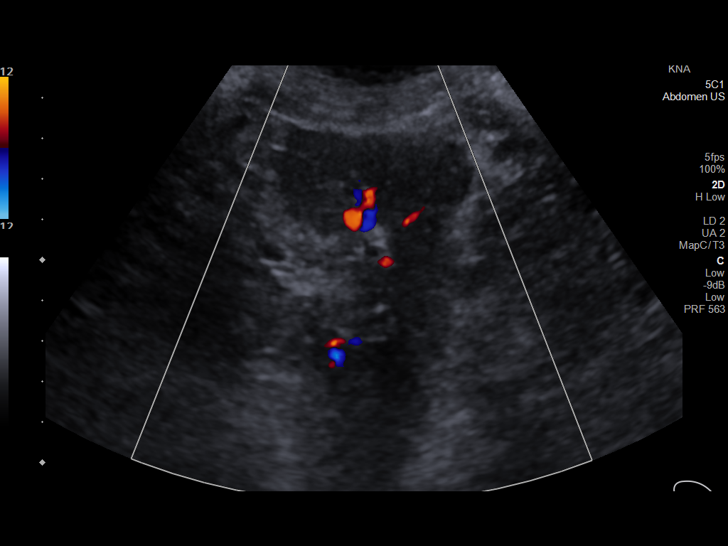
[im 125/187]
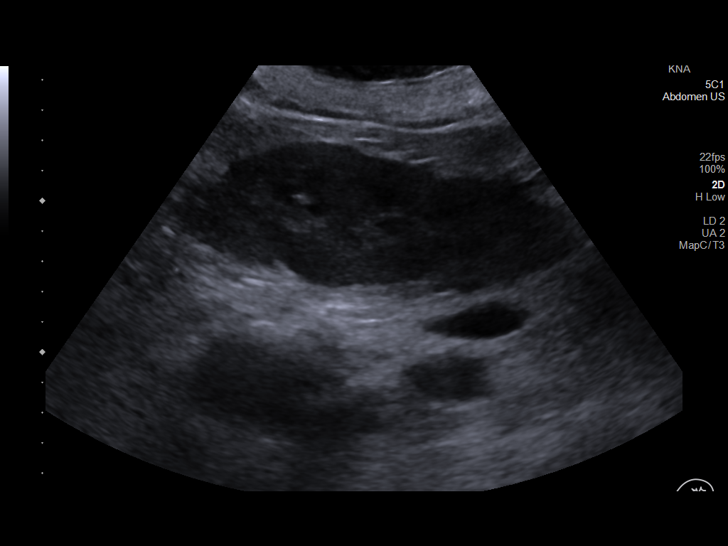
[im 140/187]
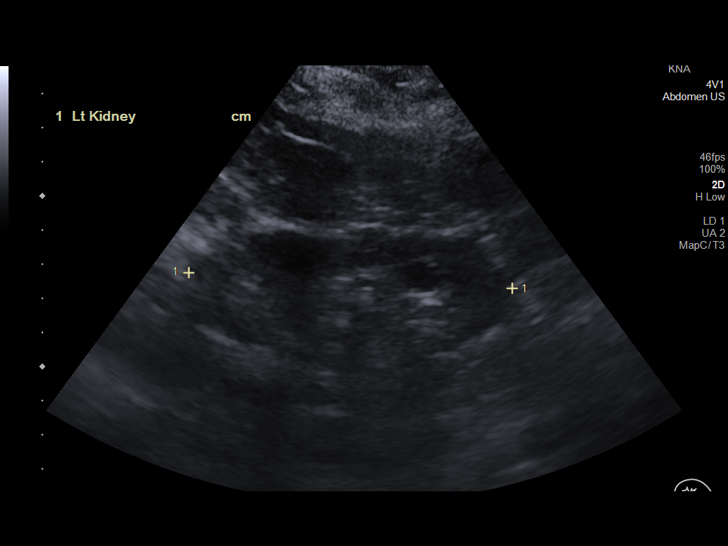
[im 156/187]
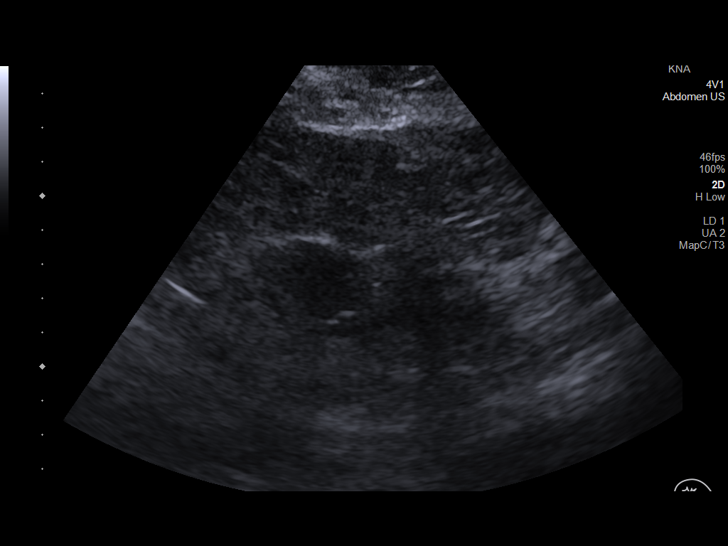
[im 171/187]
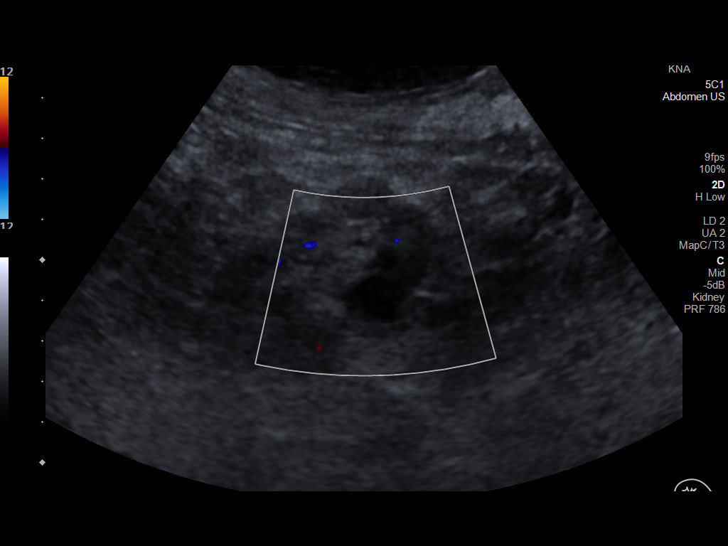
[im 187/187]
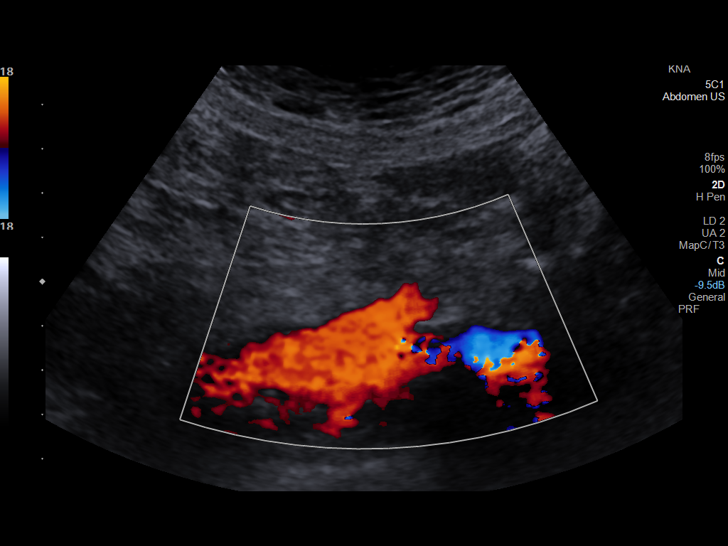

[13 of 25 positions shown; findings below may reference images not displayed]

FINDINGS: Gallbladder: No shadowing stone. Slight gallbladder wall thickening
up to 5.3 mm. No sonographic Murphy

Common bile duct: Diameter: 3.5 mm

Liver: Enlarged heterogeneous liver with coarse hepatic echotexture
and contour nodularity suspicious for cirrhosis. No definitive focal
hepatic abnormality. Portal vein is patent on color Doppler imaging
with normal direction of blood flow towards the liver.

IVC: No abnormality visualized.

Pancreas: Visualized portion unremarkable.

Spleen: Size and appearance within normal limits.

Right Kidney: Length: 14 cm. Echogenicity within normal limits. No
mass or hydronephrosis visualized.

Left Kidney: Length: 9.5 cm. Echogenicity within normal limits.
Cortical thinning and scarring. Cyst at the lower pole measuring
cm.

Abdominal aorta: No aneurysm visualized.

Other findings: Small amount of perihepatic ascites
IMPRESSION: 1. Coarse heterogeneous liver with mild contour nodularity
suspicious for cirrhosis. Trace perihepatic ascites.
2. Mild gallbladder wall thickening without stone or sonographic
Murphy, this is a nonspecific finding and can be seen in association
with cholecystitis, liver disease, or edema forming states.
3. Cortical thinning and scarring involving left kidney.

## 2020-07-19 NOTE — Patient Instructions (Signed)
[name] [name]  07/19/2020     @PREFPERIOPPHARMACY @   Your procedure is scheduled on  07/22/2020.   Report to [facility] at  1215  P.M.   Call this number if you have problems the morning of surgery:  [phone]   Remember:  Follow the diet instructions given to you by the office.                      Take these medicines the morning of surgery with A SIP OF WATER  Amlodipine, protonix.    Please brush your teeth.  Do not wear jewelry, make-up or nail polish.  Do not wear lotions, powders, or perfumes, or deodorant.  Do not shave 48 hours prior to surgery.  Men may shave face and neck.  Do not bring valuables to the hospital.  [facility] is not responsible for any belongings or valuables.  Contacts, dentures or bridgework may not be worn into surgery.  Leave your suitcase in the car.  After surgery it may be brought to your room.  For patients admitted to the hospital, discharge time will be determined by your treatment team.  Patients discharged the day of surgery will not be allowed to drive home and must have someone with them for 24 hours.   Special instructions:  DO NOT smoke tobacco or vape the morning of your procedure.  Please read over the following fact sheets that you were given. Anesthesia Post-op Instructions and Care and Recovery After Surgery       Upper Endoscopy, Adult, Care After This sheet gives you information about how to care for yourself after your procedure. Your health care provider may also give you more specific instructions. If you have problems or questions, contact your health care provider. What can I expect after the procedure? After the procedure, it is common to have:  A sore throat.  Mild stomach pain or discomfort.  Bloating.  Nausea. Follow these instructions at home:  Follow instructions from your health care provider about what to eat or drink after your procedure.  Return to your normal activities as told by  your health care provider. Ask your health care provider what activities are safe for you.  Take over-the-counter and prescription medicines only as told by your health care provider.  If you were given a sedative during the procedure, it can affect you for several hours. Do not drive or operate machinery until your health care provider says that it is safe.  Keep all follow-up visits as told by your health care provider. This is important.   Contact a health care provider if you have:  A sore throat that lasts longer than one day.  Trouble swallowing. Get help right away if:  You vomit blood or your vomit looks like coffee grounds.  You have: ? A fever. ? Bloody, black, or tarry stools. ? A severe sore throat or you cannot swallow. ? Difficulty breathing. ? Severe pain in your chest or abdomen. Summary  After the procedure, it is common to have a sore throat, mild stomach discomfort, bloating, and nausea.  If you were given a sedative during the procedure, it can affect you for several hours. Do not drive or operate machinery until your health care provider says that it is safe.  Follow instructions from your health care provider about what to eat or drink after your procedure.  Return to your normal activities as told  by your health care provider. This information is not intended to replace advice given to you by your health care provider. Make sure you discuss any questions you have with your health care provider. Document Revised: 04/15/2019 Document Reviewed: 09/17/2017 Elsevier Patient Education  2021 [place].  https://www.asge.org/home/for-patients/patient-information/understanding-eso-dilation-updated">  Esophageal Dilatation Esophageal dilatation, also called esophageal dilation, is a procedure to widen or open a blocked or narrowed part of the esophagus. The esophagus is the part of the body that moves food and liquid from the mouth to the stomach. You may need  this procedure if:  You have a buildup of scar tissue in your esophagus that makes it difficult, painful, or impossible to swallow. This can be caused by gastroesophageal reflux disease (GERD).  You have cancer of the esophagus.  There is a problem with how food moves through your esophagus. In some cases, you may need this procedure repeated at a later time to dilate the esophagus gradually. Tell a health care provider about:  Any allergies you have.  All medicines you are taking, including vitamins, herbs, eye drops, creams, and over-the-counter medicines.  Any problems you or family members have had with anesthetic medicines.  Any blood disorders you have.  Any surgeries you have had.  Any medical conditions you have.  Any antibiotic medicines you are required to take before dental procedures.  Whether you are pregnant or may be pregnant. What are the risks? Generally, this is a safe procedure. However, problems may occur, including:  Bleeding due to a tear in the lining of the esophagus.  A hole, or perforation, in the esophagus. What happens before the procedure?  Ask your health care provider about: ? Changing or stopping your regular medicines. This is especially important if you are taking diabetes medicines or blood thinners. ? Taking medicines such as aspirin and ibuprofen. These medicines can thin your blood. Do not take these medicines unless your health care provider tells you to take them. ? Taking over-the-counter medicines, vitamins, herbs, and supplements.  Follow instructions from your health care provider about eating or drinking restrictions.  Plan to have a responsible adult take you home from the hospital or clinic.  Plan to have a responsible adult care for you for the time you are told after you leave the hospital or clinic. This is important. What happens during the procedure?  You may be given a medicine to help you relax (sedative).  A numbing  medicine may be sprayed into the back of your throat, or you may gargle the medicine.  Your health care provider may perform the dilatation using various surgical instruments, such as: ? Simple dilators. This instrument is carefully placed in the esophagus to stretch it. ? Guided wire bougies. This involves using an endoscope to insert a wire into the esophagus. A dilator is passed over this wire to enlarge the esophagus. Then the wire is removed. ? Balloon dilators. An endoscope with a small balloon is inserted into the esophagus. The balloon is inflated to stretch the esophagus and open it up. The procedure may vary among health care providers and hospitals. What can I expect after the procedure?  Your blood pressure, heart rate, breathing rate, and blood oxygen level will be monitored until you leave the hospital or clinic.  Your throat may feel slightly sore and numb. This will get better over time.  You will not be allowed to eat or drink until your throat is no longer numb.  When you are able  to drink, urinate, and sit on the edge of the bed without nausea or dizziness, you may be able to return home. Follow these instructions at home:  Take over-the-counter and prescription medicines only as told by your health care provider.  If you were given a sedative during the procedure, it can affect you for several hours. Do not drive or operate machinery until your health care provider says that it is safe.  Plan to have a responsible adult care for you for the time you are told. This is important.  Follow instructions from your health care provider about any eating or drinking restrictions.  Do not use any products that contain nicotine or tobacco, such as cigarettes, e-cigarettes, and chewing tobacco. If you need help quitting, ask your health care provider.  Keep all follow-up visits. This is important. Contact a health care provider if:  You have a fever.  You have pain that is  not relieved by medicine. Get help right away if:  You have chest pain.  You have trouble breathing.  You have trouble swallowing.  You vomit blood.  You have black, tarry, or bloody stools. These symptoms may represent a serious problem that is an emergency. Do not wait to see if the symptoms will go away. Get medical help right away. Call your local emergency services (911 in the [place] Do not drive yourself to the hospital. Summary  Esophageal dilatation, also called esophageal dilation, is a procedure to widen or open a blocked or narrowed part of the esophagus.  Plan to have a responsible adult take you home from the hospital or clinic.  For this procedure, a numbing medicine may be sprayed into the back of your throat, or you may gargle the medicine.  Do not drive or operate machinery until your health care provider says that it is safe. This information is not intended to replace advice given to you by your health care provider. Make sure you discuss any questions you have with your health care provider. Document Revised: 09/03/2019 Document Reviewed: 09/03/2019 Elsevier Patient Education  2021 [place] After This sheet gives you information about how to care for yourself after your procedure. Your health care provider may also give you more specific instructions. If you have problems or questions, contact your health care provider. What can I expect after the procedure? After the procedure, it is common to have:  Tiredness.  Forgetfulness about what happened after the procedure.  Impaired judgment for important decisions.  Nausea or vomiting.  Some difficulty with balance. Follow these instructions at home: For the time period you were told by your health care provider:  Rest as needed.  Do not participate in activities where you could fall or become injured.  Do not drive or use machinery.  Do not drink alcohol.  Do not  take sleeping pills or medicines that cause drowsiness.  Do not make important decisions or sign legal documents.  Do not take care of children on your own.      Eating and drinking  Follow the diet that is recommended by your health care provider.  Drink enough fluid to keep your urine pale yellow.  If you vomit: ? Drink water, juice, or soup when you can drink without vomiting. ? Make sure you have little or no nausea before eating solid foods. General instructions  Have a responsible adult stay with you for the time you are told. It is important to have someone help  care for you until you are awake and alert.  Take over-the-counter and prescription medicines only as told by your health care provider.  If you have sleep apnea, surgery and certain medicines can increase your risk for breathing problems. Follow instructions from your health care provider about wearing your sleep device: ? Anytime you are sleeping, including during daytime naps. ? While taking prescription pain medicines, sleeping medicines, or medicines that make you drowsy.  Avoid smoking.  Keep all follow-up visits as told by your health care provider. This is important. Contact a health care provider if:  You keep feeling nauseous or you keep vomiting.  You feel light-headed.  You are still sleepy or having trouble with balance after 24 hours.  You develop a rash.  You have a fever.  You have redness or swelling around the IV site. Get help right away if:  You have trouble breathing.  You have new-onset confusion at home. Summary  For several hours after your procedure, you may feel tired. You may also be forgetful and have poor judgment.  Have a responsible adult stay with you for the time you are told. It is important to have someone help care for you until you are awake and alert.  Rest as told. Do not drive or operate machinery. Do not drink alcohol or take sleeping pills.  Get help right  away if you have trouble breathing, or if you suddenly become confused. This information is not intended to replace advice given to you by your health care provider. Make sure you discuss any questions you have with your health care provider. Document Revised: 01/01/2020 Document Reviewed: 03/20/2019 Elsevier Patient Education  2021 [employer].

## 2020-07-20 ENCOUNTER — Encounter (HOSPITAL_COMMUNITY)
Admission: RE | Admit: 2020-07-20 | Discharge: 2020-07-20 | Disposition: A | Discharge status: Home / Self Care | Payer: Medicaid Other | Source: Ambulatory Visit | Attending: Internal Medicine | Admitting: Internal Medicine

## 2020-07-20 ENCOUNTER — Other Ambulatory Visit (HOSPITAL_COMMUNITY)
Admission: RE | Admit: 2020-07-20 | Discharge: 2020-07-20 | Disposition: A | Discharge status: Home / Self Care | Payer: Medicaid Other | Source: Ambulatory Visit | Attending: Internal Medicine | Admitting: Internal Medicine

## 2020-07-20 ENCOUNTER — Other Ambulatory Visit: Payer: Self-pay

## 2020-07-20 ENCOUNTER — Other Ambulatory Visit: Payer: Self-pay | Admitting: Gastroenterology

## 2020-07-20 DIAGNOSIS — Z20822 Contact with and (suspected) exposure to covid-19: Secondary | ICD-10-CM | POA: Diagnosis not present

## 2020-07-20 DIAGNOSIS — Z01818 Encounter for other preprocedural examination: Secondary | ICD-10-CM | POA: Insufficient documentation

## 2020-07-20 LAB — COMPREHENSIVE METABOLIC PANEL
ALT: 21 U/L (ref 0–44)
AST: 83 U/L — ABNORMAL HIGH (ref 15–41)
Albumin: 1.9 g/dL — ABNORMAL LOW (ref 3.5–5.0)
Alkaline Phosphatase: 159 U/L — ABNORMAL HIGH (ref 38–126)
Anion gap: 11 (ref 5–15)
BUN: <5 mg/dL — ABNORMAL LOW (ref 6–20)
CO2: 25 mmol/L (ref 22–32)
Calcium: 7.4 mg/dL — ABNORMAL LOW (ref 8.9–10.3)
Chloride: 99 mmol/L (ref 98–111)
Creatinine, Ser: 0.93 mg/dL (ref 0.61–1.24)
GFR, Estimated: >60 mL/min (ref >60)
Glucose, Bld: 97 mg/dL (ref 70–99)
Potassium: 2.4 mmol/L — CL (ref 3.5–5.1)
Sodium: 135 mmol/L (ref 135–145)
Total Bilirubin: 1.1 mg/dL (ref 0.3–1.2)
Total Protein: 6.9 g/dL (ref 6.5–8.1)

## 2020-07-20 LAB — CBC WITH DIFFERENTIAL/PLATELET
Abs Immature Granulocytes: 0.03 10*3/uL (ref 0.00–0.07)
Basophils Absolute: 0.1 10*3/uL (ref 0.0–0.1)
Basophils Relative: 1 %
Eosinophils Absolute: 0.1 10*3/uL (ref 0.0–0.5)
Eosinophils Relative: 1 %
HCT: 37.6 % — ABNORMAL LOW (ref 39.0–52.0)
Hemoglobin: 12.6 g/dL — ABNORMAL LOW (ref 13.0–17.0)
Immature Granulocytes: 0 %
Lymphocytes Relative: 21 %
Lymphs Abs: 1.7 10*3/uL (ref 0.7–4.0)
MCH: 30.4 pg (ref 26.0–34.0)
MCHC: 33.5 g/dL (ref 30.0–36.0)
MCV: 90.6 fL (ref 80.0–100.0)
Monocytes Absolute: 0.6 10*3/uL (ref 0.1–1.0)
Monocytes Relative: 8 %
Neutro Abs: 5.3 10*3/uL (ref 1.7–7.7)
Neutrophils Relative %: 69 %
Platelets: 199 10*3/uL (ref 150–400)
RBC: 4.15 MIL/uL — ABNORMAL LOW (ref 4.22–5.81)
RDW: 14.7 % (ref 11.5–15.5)
WBC: 7.8 10*3/uL (ref 4.0–10.5)
nRBC: 0 % (ref 0.0–0.2)

## 2020-07-20 LAB — IRON AND TIBC
Iron: 137 ug/dL (ref 45–182)
Saturation Ratios: 93 % — ABNORMAL HIGH (ref 17.9–39.5)
TIBC: 147 ug/dL — ABNORMAL LOW (ref 250–450)
UIBC: 10 ug/dL

## 2020-07-20 LAB — PROTIME-INR
INR: 1.3 — ABNORMAL HIGH (ref 0.8–1.2)
Prothrombin Time: 15.4 seconds — ABNORMAL HIGH (ref 11.4–15.2)

## 2020-07-20 LAB — SARS CORONAVIRUS 2 (TAT 6-24 HRS): SARS Coronavirus 2: NEGATIVE

## 2020-07-20 LAB — FERRITIN: Ferritin: 1254 ng/mL — ABNORMAL HIGH (ref 24–336)

## 2020-07-20 MED ORDER — POTASSIUM CHLORIDE ER 10 MEQ PO TBCR: 40.0000 meq | EXTENDED_RELEASE_TABLET | Freq: Two times a day (BID) | ORAL | 0 refills | Status: DC

## 2020-07-20 NOTE — H&P (View-Only) (Signed)
Potassium 2.4 on pre-op labs. Creatinine 0.93. I tried to call patient on his cell and girlfriend's cell ([phone]), and I also called [name] Howard, his aunt. I was able to reach the aunt (listed as patient contact). Informed need for patient to call us back regarding low potassium and treatment.   I have sent in potassium 40 mEq BID starting today through 3/24. When he calls back, please find out if taking any additional potassium. We also need to know if he is still taking spironolactone (as this may need to be adjusted while taking supplemental potassium as it can raise potassium as well).   Additional lab result details in result note.  

## 2020-07-20 NOTE — Pre-Procedure Instructions (Signed)
Potassium of 2.4 called from lab. Results forwarded to Dr Gala Romney and I interoffice messaged Mindy at [facility] as well.

## 2020-07-20 NOTE — Progress Notes (Signed)
Potassium 2.4 on pre-op labs. Creatinine 0.93. I tried to call patient on his cell and girlfriend's cell [phone]), and I also called Ronney Asters, his aunt. I was able to reach the aunt (listed as patient contact). Informed need for patient to call us back regarding low potassium and treatment.   I have sent in potassium 40 mEq BID starting today through 3/24. When he calls back, please find out if taking any additional potassium. We also need to know if he is still taking spironolactone (as this may need to be adjusted while taking supplemental potassium as it can raise potassium as well).   Additional lab result details in result note.

## 2020-07-20 NOTE — Progress Notes (Signed)
Spoke with pt. Pt is going to pick up the potassium pills sent to his pharmacy. Pt takes Spironolactone 25 mg daily. Pt doesn't know of other potassium being taken.

## 2020-07-21 ENCOUNTER — Other Ambulatory Visit: Payer: Self-pay

## 2020-07-21 ENCOUNTER — Other Ambulatory Visit: Payer: Self-pay | Admitting: Gastroenterology

## 2020-07-21 DIAGNOSIS — Z79899 Other long term (current) drug therapy: Secondary | ICD-10-CM

## 2020-07-21 DIAGNOSIS — E875 Hyperkalemia: Secondary | ICD-10-CM

## 2020-07-21 LAB — AFP TUMOR MARKER: AFP, Serum, Tumor Marker: 2 ng/mL (ref 0.0–8.4)

## 2020-07-21 NOTE — Progress Notes (Signed)
Please place on cancellation list. Very important he gets seen in 1-2 weeks if at all possible.

## 2020-07-21 NOTE — Pre-Procedure Instructions (Signed)
Potassium results and orders reviewed with Dr Briant Cedar. Will do Istat on arrival per his order.

## 2020-07-21 NOTE — Progress Notes (Signed)
Tried to call patient. Hold spironolactone today and tomorrow while taking potassium. Repeat BMP on Friday. We need to figure out better diuretic regimen for him. Needs office visit in next 1-2 weeks as well for close follow-up.

## 2020-07-21 NOTE — Progress Notes (Signed)
Spoke with pt. Pt is going to d/c spirolactone today and tomorrow. Pt will complete labs on Friday as directed.   Erline Levine can you place pt on a cancellation list? Vicente Males wants him seen in 1-2 weeks.

## 2020-07-22 ENCOUNTER — Encounter (HOSPITAL_COMMUNITY)
Admission: RE | Disposition: A | Discharge status: Home / Self Care | Payer: Self-pay | Source: Home / Self Care | Attending: Internal Medicine

## 2020-07-22 ENCOUNTER — Other Ambulatory Visit: Payer: Self-pay | Admitting: Gastroenterology

## 2020-07-22 ENCOUNTER — Encounter (HOSPITAL_COMMUNITY): Payer: Self-pay | Admitting: Internal Medicine

## 2020-07-22 ENCOUNTER — Ambulatory Visit (HOSPITAL_COMMUNITY)
Admission: RE | Admit: 2020-07-22 | Discharge: 2020-07-22 | Disposition: A | Discharge status: Home / Self Care | Payer: Medicaid Other | Attending: Internal Medicine | Admitting: Internal Medicine

## 2020-07-22 ENCOUNTER — Ambulatory Visit (HOSPITAL_COMMUNITY): Payer: Medicaid Other | Admitting: Anesthesiology

## 2020-07-22 DIAGNOSIS — R630 Anorexia: Secondary | ICD-10-CM | POA: Diagnosis not present

## 2020-07-22 DIAGNOSIS — K2950 Unspecified chronic gastritis without bleeding: Secondary | ICD-10-CM | POA: Diagnosis not present

## 2020-07-22 DIAGNOSIS — F17200 Nicotine dependence, unspecified, uncomplicated: Secondary | ICD-10-CM | POA: Insufficient documentation

## 2020-07-22 DIAGNOSIS — R112 Nausea with vomiting, unspecified: Secondary | ICD-10-CM | POA: Insufficient documentation

## 2020-07-22 DIAGNOSIS — K449 Diaphragmatic hernia without obstruction or gangrene: Secondary | ICD-10-CM | POA: Diagnosis not present

## 2020-07-22 DIAGNOSIS — R634 Abnormal weight loss: Secondary | ICD-10-CM | POA: Insufficient documentation

## 2020-07-22 DIAGNOSIS — R1013 Epigastric pain: Secondary | ICD-10-CM | POA: Diagnosis not present

## 2020-07-22 HISTORY — PX: ESOPHAGOGASTRODUODENOSCOPY (EGD) WITH PROPOFOL: SHX5813

## 2020-07-22 HISTORY — PX: BIOPSY: SHX5522

## 2020-07-22 LAB — POCT I-STAT, CHEM 8
BUN: <3 mg/dL — ABNORMAL LOW (ref 6–20)
Calcium, Ion: 1.05 mmol/L — ABNORMAL LOW (ref 1.15–1.40)
Chloride: 102 mmol/L (ref 98–111)
Creatinine, Ser: 1 mg/dL (ref 0.61–1.24)
Glucose, Bld: 91 mg/dL (ref 70–99)
HCT: 40 % (ref 39.0–52.0)
Hemoglobin: 13.6 g/dL (ref 13.0–17.0)
Potassium: 3.9 mmol/L (ref 3.5–5.1)
Sodium: 139 mmol/L (ref 135–145)
TCO2: 26 mmol/L (ref 22–32)

## 2020-07-22 SURGERY — ESOPHAGOGASTRODUODENOSCOPY (EGD) WITH PROPOFOL
Anesthesia: General

## 2020-07-22 MED ORDER — LACTATED RINGERS IV SOLN: INTRAVENOUS | Status: DC | PRN

## 2020-07-22 MED ORDER — LIDOCAINE HCL (CARDIAC) PF 100 MG/5ML IV SOSY
PREFILLED_SYRINGE | INTRAVENOUS | Status: DC | PRN
  Administered 2020-07-22: 50 mg via INTRAVENOUS

## 2020-07-22 MED ORDER — PROPOFOL 500 MG/50ML IV EMUL
INTRAVENOUS | Status: DC | PRN
  Administered 2020-07-22: 150 ug/kg/min via INTRAVENOUS

## 2020-07-22 MED ORDER — LACTATED RINGERS IV SOLN: INTRAVENOUS | Status: DC

## 2020-07-22 MED ORDER — PROPOFOL 10 MG/ML IV BOLUS
INTRAVENOUS | Status: DC | PRN
  Administered 2020-07-22: 100 mg via INTRAVENOUS

## 2020-07-22 NOTE — Op Note (Signed)
[facility] Patient Name: [name] [name] Procedure Date: 07/22/2020 2:21 PM MRN: [id number] Date of Birth: 09/06/1965 Attending MD: Norvel Richards , MD CSN: [id number] Age: 55 Admit Type: Outpatient Procedure:                Upper GI endoscopy Indications:              Dyspepsia, Nausea with vomiting Providers:                Norvel Richards, MD, Jeanann Lewandowsky. Sharon Seller, RN,                            Nelma Rothman, Technician Referring MD:              Medicines:                Propofol per Anesthesia Complications:            No immediate complications. Estimated Blood Loss:     Estimated blood loss was minimal. Procedure:                Pre-Anesthesia Assessment:                           - Prior to the procedure, a History and Physical                            was performed, and patient medications and                            allergies were reviewed. The patient's tolerance of                            previous anesthesia was also reviewed. The risks                            and benefits of the procedure and the sedation                            options and risks were discussed with the patient.                            All questions were answered, and informed consent                            was obtained. Prior Anticoagulants: The patient has                            taken no previous anticoagulant or antiplatelet                            agents. ASA Grade Assessment: III - A patient with                            severe systemic disease. After reviewing the risks  and benefits, the patient was deemed in                            satisfactory condition to undergo the procedure.                           After obtaining informed consent, the endoscope was                            passed under direct vision. Throughout the                            procedure, the patient's blood pressure, pulse, and                             oxygen saturations were monitored continuously. The                            GIF-H190 ([id number]) scope was introduced through the                            mouth, and advanced to the second part of duodenum.                            The upper GI endoscopy was accomplished without                            difficulty. The upper GI endoscopy was accomplished                            without difficulty. The patient tolerated the                            procedure well. Scope In: 2:39:17 PM Scope Out: 2:43:21 PM Total Procedure Duration: 0 hours 4 minutes 4 seconds  Findings:      The examined esophagus was normal.      A small hiatal hernia was present. Patchy erythema and scattered       erosions of the gastric mucosa. Submucosal nodule just below the       ampulla. Ampulla appeared to be somewhat bulging enlarged and irregular.       Please see photos. Biopsy of the abnormal gastric mucosa taken for       histologic study Impression:               - Normal esophagus.                           - Small hiatal hernia. Abnormal gastric mucosa of                            uncertain significance - status post biopsy                           -Abnormal appearing ampulla and periampullary mucosa Moderate Sedation:      Moderate (  conscious) sedation was personally administered by an       anesthesia professional. The following parameters were monitored: oxygen       saturation, heart rate, blood pressure, respiratory rate, EKG, adequacy       of pulmonary ventilation, and response to care. Recommendation:           - Patient has a contact number available for                            emergencies. The signs and symptoms of potential                            delayed complications were discussed with the                            patient. Return to normal activities tomorrow.                            Written discharge instructions were provided to the                             patient.                           - Advance diet as tolerated. Trial of Protonix 40                            mg daily. Office visit in 4 weeks. Consider further                            evaluation of abnormal ampulla /MRI pancreas/MRCP                           - Continue present medications.                           - Return to my office in 4 weeks. Procedure Code(s):        --- Professional ---                           [phone], Esophagogastroduodenoscopy, flexible,                            transoral; diagnostic, including collection of                            specimen(s) by brushing or washing, when performed                            (separate procedure) Diagnosis Code(s):        --- Professional ---                           K44.9, Diaphragmatic hernia without obstruction or  gangrene                           R10.13, Epigastric pain                           R11.2, Nausea with vomiting, unspecified CPT copyright 2019 American Medical Association. All rights reserved. The codes documented in this report are preliminary and upon coder review may  be revised to meet current compliance requirements. [name], MD Norvel Richards, MD 07/22/2020 3:00:24 PM This report has been signed electronically. Number of Addenda: 0

## 2020-07-22 NOTE — Anesthesia Postprocedure Evaluation (Signed)
Anesthesia Post Note  Patient: [name] [name]  Procedure(s) Performed: ESOPHAGOGASTRODUODENOSCOPY (EGD) WITH PROPOFOL (N/A ) BIOPSY  Patient location during evaluation: PACU Anesthesia Type: General Level of consciousness: awake Pain management: pain level controlled Vital Signs Assessment: post-procedure vital signs reviewed and stable Respiratory status: spontaneous breathing and respiratory function stable Cardiovascular status: blood pressure returned to baseline and stable Postop Assessment: no headache and no apparent nausea or vomiting Anesthetic complications: no   No complications documented.   Last Vitals:  Vitals:   07/22/20 1343  BP: 110/71  Resp: 11  Temp: 37.2 C  SpO2: 100%    Last Pain:  Vitals:   07/22/20 1435  TempSrc:   PainSc: [place]

## 2020-07-22 NOTE — Discharge Instructions (Signed)
EGD Discharge instructions Please read the instructions outlined below and refer to this sheet in the next few weeks. These discharge instructions provide you with general information on caring for yourself after you leave the hospital. Your doctor may also give you specific instructions. While your treatment has been planned according to the most current medical practices available, unavoidable complications occasionally occur. If you have any problems or questions after discharge, please call your doctor. ACTIVITY  You may resume your regular activity but move at a slower pace for the next 24 hours.   Take frequent rest periods for the next 24 hours.   Walking will help expel (get rid of) the air and reduce the bloated feeling in your abdomen.   No driving for 24 hours (because of the anesthesia (medicine) used during the test).   You may shower.   Do not sign any important legal documents or operate any machinery for 24 hours (because of the anesthesia used during the test).  NUTRITION  Drink plenty of fluids.   You may resume your normal diet.   Begin with a light meal and progress to your normal diet.   Avoid alcoholic beverages for 24 hours or as instructed by your caregiver.  MEDICATIONS  You may resume your normal medications unless your caregiver tells you otherwise.  WHAT YOU CAN EXPECT TODAY  You may experience abdominal discomfort such as a feeling of fullness or "gas" pains.  FOLLOW-UP  Your doctor will discuss the results of your test with you.  SEEK IMMEDIATE MEDICAL ATTENTION IF ANY OF THE FOLLOWING OCCUR:  Excessive nausea (feeling sick to your stomach) and/or vomiting.   Severe abdominal pain and distention (swelling).   Trouble swallowing.   Temperature over 101 F (37.8 C).   Rectal bleeding or vomiting of blood.    Your stomach appeared inflamed.  Biopsies taken.  Your small intestine looked a little abnormal as well.  Further evaluation may be  warranted.  Trial of Protonix 40 mg daily to help with excess acid  Office visit with Roseanne Kaufman in 4 weeks       Monitored Anesthesia Care, Care After This sheet gives you information about how to care for yourself after your procedure. Your health care provider may also give you more specific instructions. If you have problems or questions, contact your health care provider. What can I expect after the procedure? After the procedure, it is common to have:  Tiredness.  Forgetfulness about what happened after the procedure.  Impaired judgment for important decisions.  Nausea or vomiting.  Some difficulty with balance. Follow these instructions at home: For the time period you were told by your health care provider:  Rest as needed.  Do not participate in activities where you could fall or become injured.  Do not drive or use machinery.  Do not drink alcohol.  Do not take sleeping pills or medicines that cause drowsiness.  Do not make important decisions or sign legal documents.  Do not take care of children on your own.      Eating and drinking  Follow the diet that is recommended by your health care provider.  Drink enough fluid to keep your urine pale yellow.  If you vomit: ? Drink water, juice, or soup when you can drink without vomiting. ? Make sure you have little or no nausea before eating solid foods. General instructions  Have a responsible adult stay with you for the time you are told. It is important  to have someone help care for you until you are awake and alert.  Take over-the-counter and prescription medicines only as told by your health care provider.  If you have sleep apnea, surgery and certain medicines can increase your risk for breathing problems. Follow instructions from your health care provider about wearing your sleep device: ? Anytime you are sleeping, including during daytime naps. ? While taking prescription pain medicines,  sleeping medicines, or medicines that make you drowsy.  Avoid smoking.  Keep all follow-up visits as told by your health care provider. This is important. Contact a health care provider if:  You keep feeling nauseous or you keep vomiting.  You feel light-headed.  You are still sleepy or having trouble with balance after 24 hours.  You develop a rash.  You have a fever.  You have redness or swelling around the IV site. Get help right away if:  You have trouble breathing.  You have new-onset confusion at home. Summary  For several hours after your procedure, you may feel tired. You may also be forgetful and have poor judgment.  Have a responsible adult stay with you for the time you are told. It is important to have someone help care for you until you are awake and alert.  Rest as told. Do not drive or operate machinery. Do not drink alcohol or take sleeping pills.  Get help right away if you have trouble breathing, or if you suddenly become confused. This information is not intended to replace advice given to you by your health care provider. Make sure you discuss any questions you have with your health care provider. Document Revised: 01/01/2020 Document Reviewed: 03/20/2019 Elsevier Patient Education  2021 [employer].

## 2020-07-22 NOTE — Anesthesia Procedure Notes (Signed)
Date/Time: 07/22/2020 2:40 PM Performed by: Orlie Dakin, CRNA Pre-anesthesia Checklist: Patient identified, Emergency Drugs available, Suction available and Patient being monitored Patient Re-evaluated:Patient Re-evaluated prior to induction Oxygen Delivery Method: Nasal cannula Induction Type: IV induction Placement Confirmation: positive ETCO2

## 2020-07-22 NOTE — Transfer of Care (Signed)
Immediate Anesthesia Transfer of Care Note  Patient: [name] [name]  Procedure(s) Performed: ESOPHAGOGASTRODUODENOSCOPY (EGD) WITH PROPOFOL (N/A ) BIOPSY  Patient Location: PACU  Anesthesia Type:General  Level of Consciousness: awake and oriented  Airway & Oxygen Therapy: Patient Spontanous Breathing  Post-op Assessment: Report given to RN and Post -op Vital signs reviewed and stable  Post vital signs: Reviewed and stable  Last Vitals:  Vitals Value Taken Time  BP    Temp    Pulse 114 07/22/20 1450  Resp 24 07/22/20 1450  SpO2 98 % 07/22/20 1450  Vitals shown include unvalidated device data.  Last Pain:  Vitals:   07/22/20 1435  TempSrc:   PainSc: 9       Patients Stated Pain Goal: 8 (01/00/71 2197)  Complications: No complications documented.

## 2020-07-22 NOTE — Anesthesia Preprocedure Evaluation (Signed)
Anesthesia Evaluation  Patient identified by MRN, date of birth, ID band Patient awake    Reviewed: Allergy & Precautions, H&P , NPO status , Patient's Chart, lab work & pertinent test results, reviewed documented beta blocker date and time   Airway Mallampati: II  TM Distance: >3 FB Neck ROM: full    Dental no notable dental hx. (+) Teeth Intact   Pulmonary shortness of breath, asthma , Current Smoker,    Pulmonary exam normal breath sounds clear to auscultation       Cardiovascular Exercise Tolerance: Good hypertension, negative cardio ROS   Rhythm:regular Rate:Normal     Neuro/Psych negative neurological ROS  negative psych ROS   GI/Hepatic negative GI ROS, Neg liver ROS,   Endo/Other  negative endocrine ROS  Renal/GU Renal disease  negative genitourinary   Musculoskeletal   Abdominal   Peds  Hematology negative hematology ROS (+)   Anesthesia Other Findings   Reproductive/Obstetrics negative OB ROS                             Anesthesia Physical Anesthesia Plan  ASA: II  Anesthesia Plan: General   Post-op Pain Management:    Induction:   PONV Risk Score and Plan: Propofol infusion  Airway Management Planned:   Additional Equipment:   Intra-op Plan:   Post-operative Plan:   Informed Consent: I have reviewed the patients History and Physical, chart, labs and discussed the procedure including the risks, benefits and alternatives for the proposed anesthesia with the patient or authorized representative who has indicated his/her understanding and acceptance.     Dental Advisory Given  Plan Discussed with: CRNA  Anesthesia Plan Comments:         Anesthesia Quick Evaluation

## 2020-07-22 NOTE — Interval H&P Note (Signed)
History and Physical Interval Note:  07/22/2020 2:29 PM  [name] [name]  has presented today for surgery, with the diagnosis of wt loss, loss of appetite, regurgitation.  The various methods of treatment have been discussed with the patient and family. After consideration of risks, benefits and other options for treatment, the patient has consented to  Procedure(s) with comments: ESOPHAGOGASTRODUODENOSCOPY (EGD) WITH PROPOFOL (N/A) - PM early as possible in evening MALONEY DILATION (N/A) as a surgical intervention.  The patient's history has been reviewed, patient examined, no change in status, stable for surgery.  I have reviewed the patient's chart and labs.  Questions were answered to the patient's satisfaction.     Manus Rudd  Patient absolutely denies any dysphagia.  Otherwise no change.  Diagnostic EGD per plan today.  The risks, benefits, limitations, alternatives and imponderables have been reviewed with the patient. Potential for esophageal dilation, biopsy, etc. have also been reviewed.  Questions have been answered. All parties agreeable.

## 2020-07-23 ENCOUNTER — Other Ambulatory Visit: Payer: Self-pay | Admitting: Gastroenterology

## 2020-07-26 ENCOUNTER — Other Ambulatory Visit: Payer: Self-pay

## 2020-07-26 ENCOUNTER — Telehealth: Payer: Self-pay | Admitting: Gastroenterology

## 2020-07-26 LAB — SURGICAL PATHOLOGY

## 2020-07-26 NOTE — Telephone Encounter (Signed)
Repeat BMP with potassium 3.9.  What is he taking daily for diuretic therapy? I need to adjust this. How is lower extremity edema?

## 2020-07-26 NOTE — Telephone Encounter (Signed)
Spoke with pt. Pt was notified of lab results and takes 25 mg of Spironolactone daily. Pt has severe swelling in his upper and lower legs. Pt states that he can't walk well with swelling.  Placing orders for possible iron overload.

## 2020-07-27 LAB — BASIC METABOLIC PANEL
BUN/Creatinine Ratio: 3 (calc) — ABNORMAL LOW (ref 6–22)
BUN: 3 mg/dL — ABNORMAL LOW (ref 7–25)
CO2: 22 mmol/L (ref 20–32)
Calcium: 7.8 mg/dL — ABNORMAL LOW (ref 8.6–10.3)
Chloride: 106 mmol/L (ref 98–110)
Creat: 0.99 mg/dL (ref 0.70–1.33)
Glucose, Bld: 83 mg/dL (ref 65–99)
Potassium: 3.6 mmol/L (ref 3.5–5.3)
Sodium: 137 mmol/L (ref 135–146)

## 2020-07-27 MED ORDER — FUROSEMIDE 20 MG PO TABS: 20.0000 mg | ORAL_TABLET | Freq: Every day | ORAL | 5 refills | Status: DC

## 2020-07-27 NOTE — Telephone Encounter (Signed)
Spoke with pt. Pt is going to increase Spironolactone to 50 mg daily and add Lasix 20 mg in the morning. Pt states that he can't come in this Wednesday or Thursday but can do next week.

## 2020-07-27 NOTE — Addendum Note (Signed)
Addended by: Annitta Needs on: 07/27/2020 04:14 PM   Modules accepted: Orders

## 2020-07-27 NOTE — Telephone Encounter (Signed)
We need to increase diuretics. We can try 50 mg of spironolactone daily and add 20 mg of lasix, with repeat BMP in 1 week. I am sending in lasix to take. He has spironolactone at home. It's important to take these together in the morning.  Would he be willing or able to come into clinic on Wednesday ( I would create a slot) or Thursday at 11am? He really needs to be seen in person.

## 2020-07-28 NOTE — Telephone Encounter (Signed)
[name] [name], spoke with pt and he can do 1PM.

## 2020-07-28 NOTE — Telephone Encounter (Signed)
Thanks, [name] [name]. Would he be able to come next Wednesday around 1pm? I can add a slot to see him.

## 2020-07-28 NOTE — Telephone Encounter (Signed)
Will call pt back. Not able to reach pt with first attempt. No VM.

## 2020-07-29 ENCOUNTER — Encounter (HOSPITAL_COMMUNITY): Payer: Self-pay | Admitting: Internal Medicine

## 2020-07-29 NOTE — Telephone Encounter (Signed)
I do not have access to open a slot, I believe that would [place].

## 2020-07-29 NOTE — Telephone Encounter (Signed)
[name] [name], you can schedule it now.

## 2020-07-29 NOTE — Telephone Encounter (Signed)
Called patient and he is aware of appointment  

## 2020-07-29 NOTE — Telephone Encounter (Signed)
I tried to add a 1:00 on that day but its a hospital day for [place] would not let me add it. Routing to [place] for assistance with this.

## 2020-07-29 NOTE — Telephone Encounter (Signed)
1:00 open now on 4/6

## 2020-07-29 NOTE — Telephone Encounter (Signed)
Noted  

## 2020-07-29 NOTE — Telephone Encounter (Signed)
Erline Levine, can you open a slot for next Wednesday at 1pm to see this patient urgently for me? Thanks!  Thanks, Elmo Putt, for talking to him.

## 2020-07-30 ENCOUNTER — Encounter: Payer: Self-pay | Admitting: Internal Medicine

## 2020-08-04 ENCOUNTER — Ambulatory Visit (INDEPENDENT_AMBULATORY_CARE_PROVIDER_SITE_OTHER): Payer: Medicaid Other | Admitting: Gastroenterology

## 2020-08-04 ENCOUNTER — Encounter: Payer: Self-pay | Admitting: Internal Medicine

## 2020-08-04 ENCOUNTER — Encounter: Payer: Self-pay | Admitting: Gastroenterology

## 2020-08-04 ENCOUNTER — Ambulatory Visit (HOSPITAL_COMMUNITY)
Admission: RE | Admit: 2020-08-04 | Discharge: 2020-08-04 | Disposition: A | Discharge status: Home / Self Care | Payer: Medicaid Other | Source: Ambulatory Visit | Attending: Gastroenterology | Admitting: Gastroenterology

## 2020-08-04 ENCOUNTER — Other Ambulatory Visit: Payer: Self-pay

## 2020-08-04 DIAGNOSIS — R600 Localized edema: Secondary | ICD-10-CM | POA: Diagnosis not present

## 2020-08-04 DIAGNOSIS — K7030 Alcoholic cirrhosis of liver without ascites: Secondary | ICD-10-CM | POA: Diagnosis not present

## 2020-08-04 DIAGNOSIS — K760 Fatty (change of) liver, not elsewhere classified: Secondary | ICD-10-CM

## 2020-08-04 DIAGNOSIS — K7460 Unspecified cirrhosis of liver: Secondary | ICD-10-CM | POA: Insufficient documentation

## 2020-08-04 IMAGING — US US EXTREM LOW VENOUS
1 series · 12 of 24 positions shown · non-contrast
Comparison: None.

CLINICAL DATA: Bilateral lower extremity edema



[Series 1: us venous img lower bilat (dvt) · portal-venous · 12 of 73 slices shown]
[im 4/73]
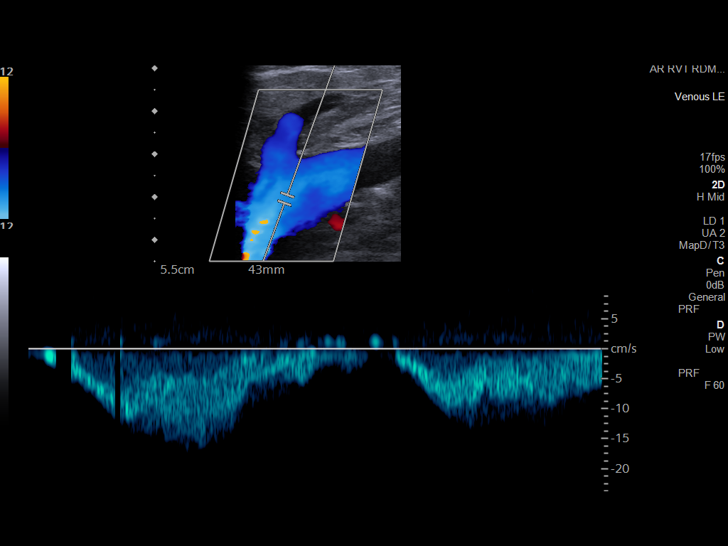
[im 10/73]
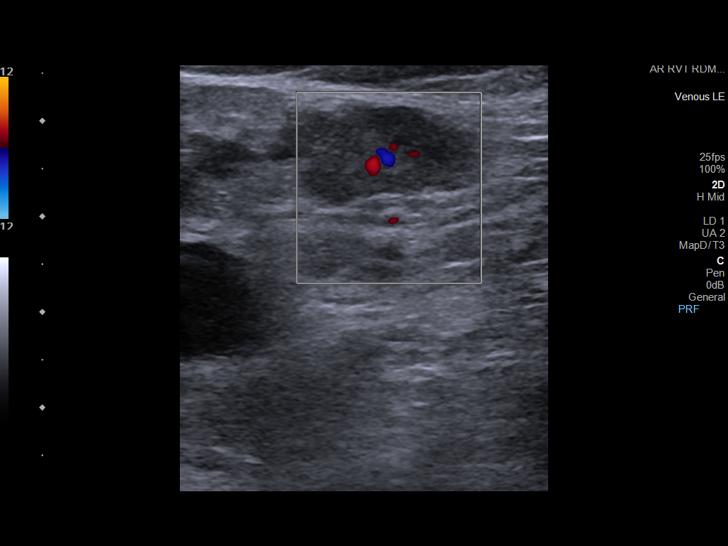
[im 16/73]
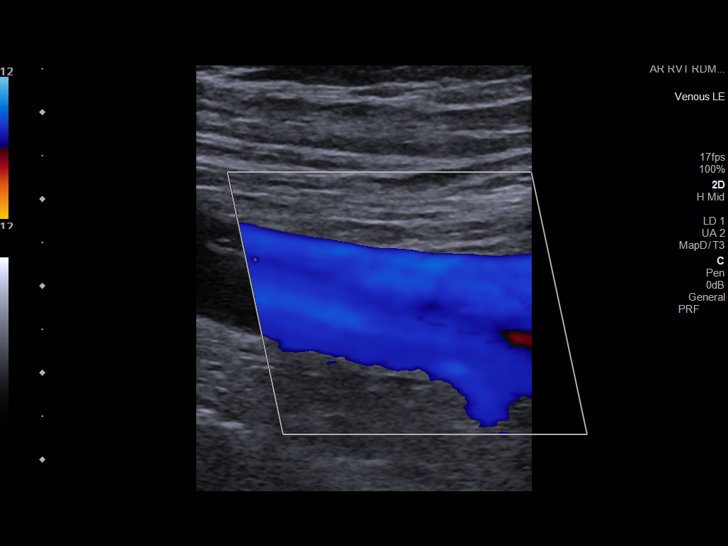
[im 22/73]
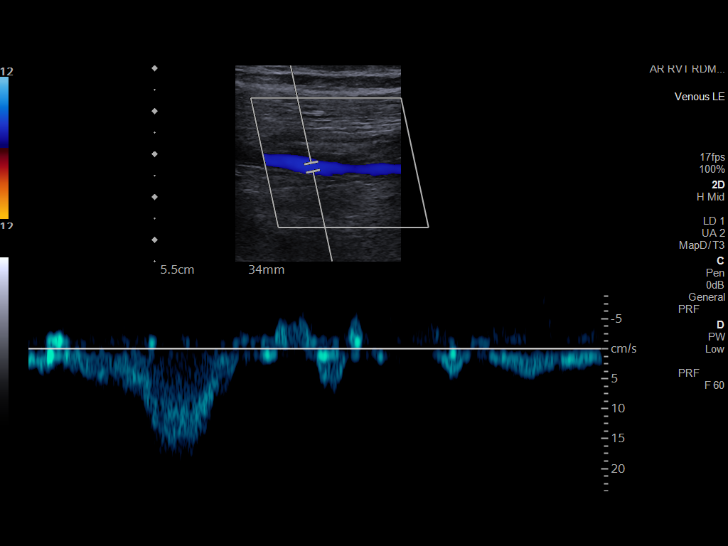
[im 29/73]
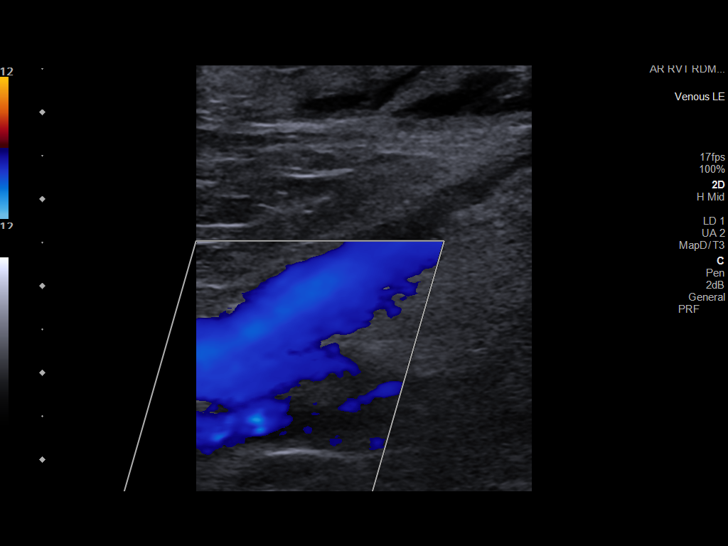
[im 35/73]
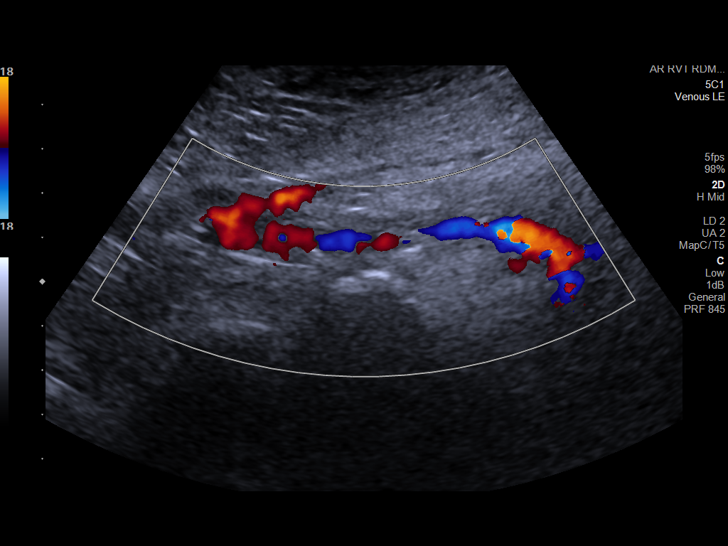
[im 41/73]
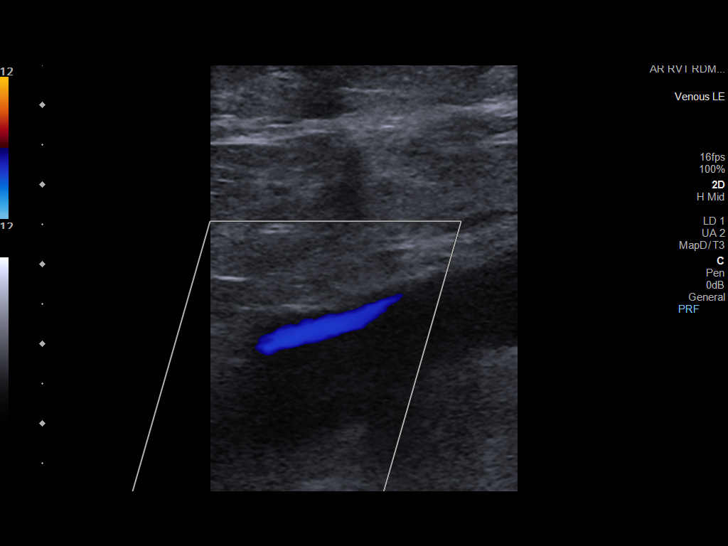
[im 47/73]
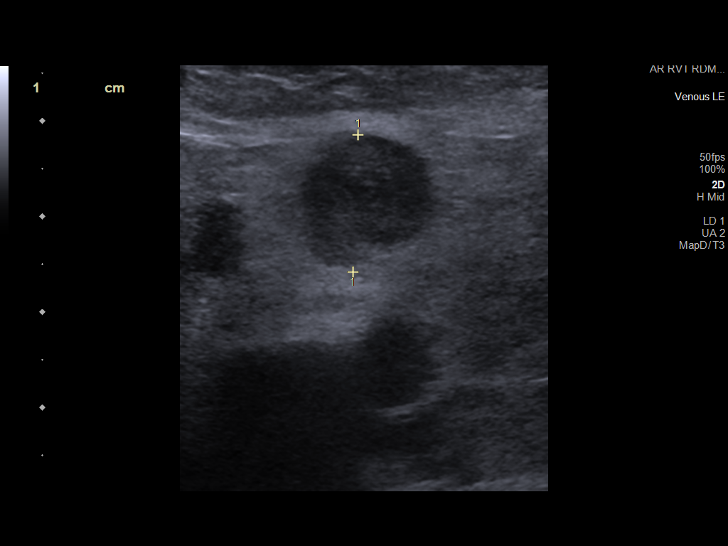
[im 54/73]
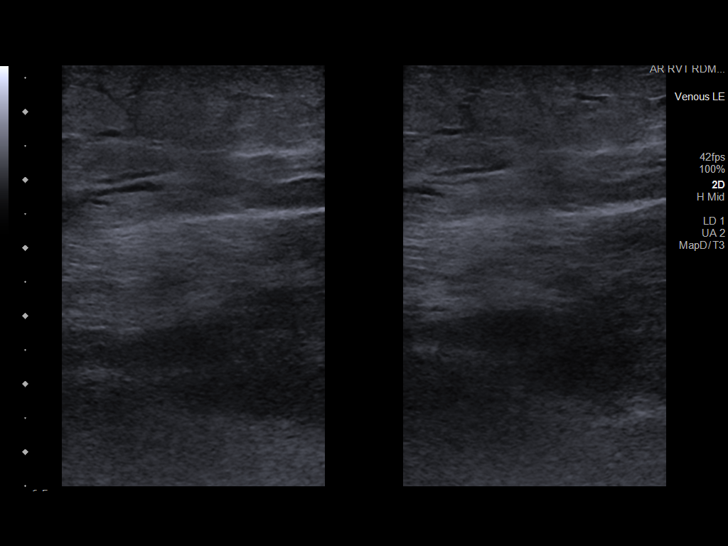
[im 60/73]
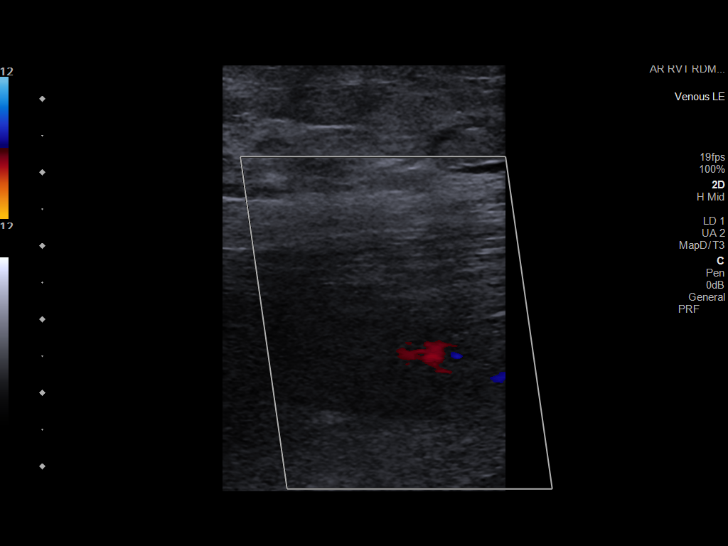
[im 66/73]
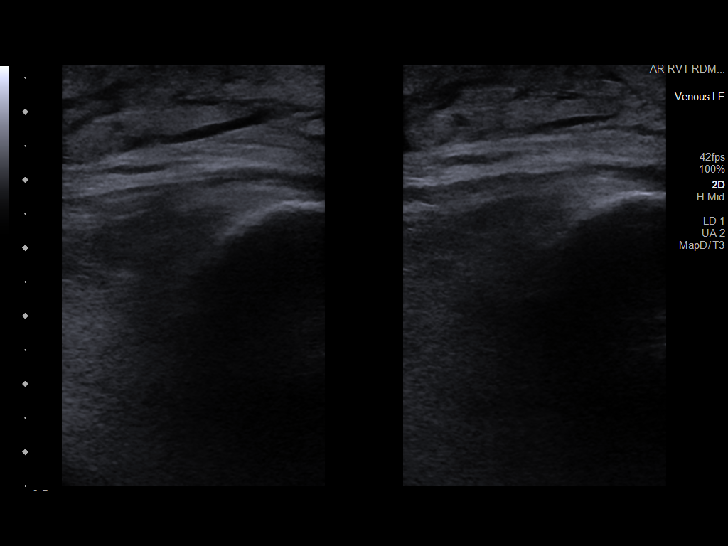
[im 73/73]
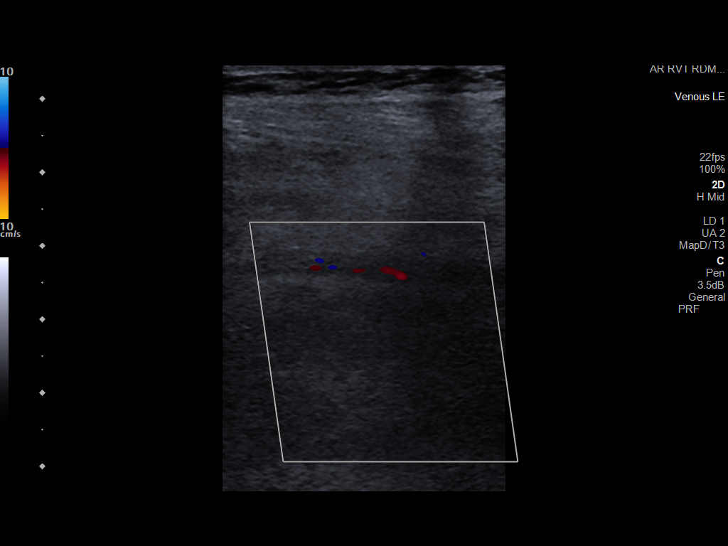

[12 of 24 positions shown; findings below may reference images not displayed]

FINDINGS: RIGHT LOWER EXTREMITY

Common Femoral Vein: No evidence of thrombus. Normal
compressibility, respiratory phasicity and response to augmentation.

Saphenofemoral Junction: No evidence of thrombus. Normal
compressibility and flow on color Doppler imaging.

Profunda Femoral Vein: No evidence of thrombus. Normal
compressibility and flow on color Doppler imaging.

Femoral Vein: No evidence of thrombus. Normal compressibility,
respiratory phasicity and response to augmentation.

Popliteal Vein: No evidence of thrombus. Normal compressibility,
respiratory phasicity and response to augmentation.

Calf Veins: No evidence of thrombus. Normal compressibility and flow
on color Doppler imaging.

Superficial Great Saphenous Vein: No evidence of thrombus. Normal
compressibility.

Venous Reflux:  None.

Other Findings: Mildly prominent right inguinal lymph nodes noted.

LEFT LOWER EXTREMITY

Common Femoral Vein: There is acute appearing deep venous thrombosis
in the left common femoral vein. There is no appreciable flow in
this vessel. No compression or augmentation evident.

Saphenofemoral Junction: No evidence of thrombus. Normal
compressibility and flow on color Doppler imaging.

Profunda Femoral Vein: There is acute appearing deep venous
thrombosis in the profunda femoral vein. No appreciable flow in this
vessel. No compression or augmentation evident.

Femoral Vein: There is acute appearing deep venous thrombosis
throughout the left femoral vein without appreciable flow evident.
No compression or augmentation evident.

Popliteal Vein: Acute appearing deep venous thrombosis throughout
the left popliteal vein without appreciable flow evident. No
compression or augmentation evident.

Calf Veins: Acute appearing deep venous thrombosis in the peroneal
and posterior tibial veins with essentially absent flow in these
vessels. Loss of compression augmentation in these vessels. Anterior
tibial veins appear patent.

Superficial Great Saphenous Vein: No evidence of thrombus. Normal
compressibility.

Venous Reflux:  None.

Other Findings:  Mildly prominent left inguinal lymph nodes noted.
IMPRESSION: 1. Extensive deep venous thrombosis throughout the left lower
extremity venous structures.

2.  No deep venous thrombosis in the right lower extremity.

3.  Prominent inguinal lymph nodes bilaterally.

These results will be called to the ordering clinician or
representative by the [HOSPITAL] at the imaging location.

## 2020-08-04 NOTE — Progress Notes (Signed)
Referring Provider: Rosita Fire, MD Primary Care Physician:  Rosita Fire, MD Primary GI: Dr. Gala Romney  Chief Complaint  Patient presents with  . swelling    HPI:   [name] [name] is a 55 y.o. male presenting today with a history of alcoholic liver disease. Decompensated with notable lower extremity edema when initially seen early March 2022. US abdomen suspicious for cirrrhosis.  Hep B surface antigen negative, Hep C antibody negative. Colonoscopy on file from 2018 with tubular adenomas and surveillance due in 2023. MELD Na 10. Ferritin markedly elevated, iron sats 93, needs hemochromatosis DNA. Recent EGD with normal esophagus, small hiatal hernia, abnormal gastric mucosa, abnormal appearing ampula and periampullary mucosa. Mild chronic gastritis.  Aldactone increased to 50 mg daily, lasix 20 mg in am last week. Need to recheck BMP. Notes chronic LLE greater edema than right. States LLE with tingling in calf. Lower extremity swelling bilaterally is persistent and similar to when seen in March 2022. Notable hypoalbuminemia.   No alcohol in over a month. No abdominal pain. Appetite is getting better. 1-2 Ensure shakes per day. No overt GI bleeding.   Past Medical History:  Diagnosis Date  . Asthma   . Dyspnea   . High cholesterol   . History of kidney stones   . Hypertension   . Kidney stones     Past Surgical History:  Procedure Laterality Date  . APPENDECTOMY    . BIOPSY  07/22/2020   Procedure: BIOPSY;  Surgeon: Daneil Dolin, MD;  Location: [facility];  Service: Endoscopy;;  . COLONOSCOPY WITH PROPOFOL N/A 04/18/2017   non-bleeding internal hemorrhoids, two 4-6 mm polyps in descending colon and cecum, pancolonic diverticulosis, single cecal AVM. Tubular adenomas, surveillance in 2023.   Marland Kitchen ESOPHAGOGASTRODUODENOSCOPY (EGD) WITH PROPOFOL N/A 07/22/2020   normal esophagus, small hiatal hernia, abnormal gastric mucosa, abnormal appearing ampula and periampullary  mucosa. Mild chronic gastritis.  Marland Kitchen FRACTURE SURGERY     left arm  . POLYPECTOMY  04/18/2017   Procedure: POLYPECTOMY;  Surgeon: Daneil Dolin, MD;  Location: [facility];  Service: Endoscopy;;    Current Outpatient Medications  Medication Sig Dispense Refill  . albuterol (PROVENTIL HFA) 108 (90 Base) MCG/ACT inhaler INHALE 2 PUFFS BY MOUTH EVERY 6 HOURS AS NEEDED FOR COUGHING, WHEEZING, OR SHORTNESS OF BREATH (Patient taking differently: Inhale 1-2 puffs into the lungs every 6 (six) hours as needed for wheezing or shortness of breath.) 20.1 g 1  . amLODipine (NORVASC) 10 MG tablet Take 10 mg by mouth in the morning.    Marland Kitchen atorvastatin (LIPITOR) 20 MG tablet Take 1 tablet (20 mg total) by mouth daily. (Patient taking differently: Take 20 mg by mouth in the morning.) 90 tablet 2  . furosemide (LASIX) 20 MG tablet Take 1 tablet (20 mg total) by mouth daily. 30 tablet 5  . lisinopril (ZESTRIL) 10 MG tablet Take 10 mg by mouth in the morning.    . mometasone-formoterol (DULERA) 200-5 MCG/ACT AERO Inhale 2 puffs into the lungs 2 (two) times daily. (Patient taking differently: Inhale 2 puffs into the lungs 2 (two) times daily as needed (wheezing/shortness of breath).) 39 g 3  . pantoprazole (PROTONIX) 40 MG tablet Take 1 tablet (40 mg total) by mouth daily. Take 30 minutes 30 tablet 3  . spironolactone (ALDACTONE) 25 MG tablet Take 25 mg by mouth 2 (two) times daily.    . potassium chloride (KLOR-CON) 10 MEQ tablet Take 4 tablets (40  mEq total) by mouth 2 (two) times daily for 3 days. 24 tablet 0   No current facility-administered medications for this visit.    Allergies as of 08/04/2020  . (No Known Allergies)    Family History  Problem Relation Age of Onset  . Cancer Father        throat  . Diabetes Sister   . Colon cancer Neg Hx     Social History   Socioeconomic History  . Marital status: Single    Spouse name: Not on file  . Number of children: Not on file  . Years of  education: Not on file  . Highest education level: Not on file  Occupational History  . Not on file  Tobacco Use  . Smoking status: Current Every Day Smoker    Packs/day: 0.50    Years: 33.00    Pack years: 16.50    Types: Cigarettes  . Smokeless tobacco: Never Used  Vaping Use  . Vaping Use: Never used  Substance and Sexual Activity  . 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 Drug use: No  . Sexual activity: Yes    Birth control/protection: None  Other Topics Concern  . Not on file  Social History Narrative  . Not on file   Social Determinants of Health   Financial Resource Strain: Not on file  Food Insecurity: Not on file  Transportation Needs: Not on file  Physical Activity: Not on file  Stress: Not on file  Social Connections: Not on file    Review of Systems: Gen: Denies fever, chills, anorexia. Denies fatigue, weakness, weight loss.  CV: Denies chest pain, palpitations, syncope, peripheral edema, and claudication. Resp: Denies dyspnea at rest, cough, wheezing, coughing up blood, and pleurisy. GI: see HPI Derm: Denies rash, itching, dry skin Psych: Denies depression, anxiety, memory loss, confusion. No homicidal or suicidal ideation.  Heme: Denies bruising, bleeding, and enlarged lymph nodes.  Physical Exam: BP 102/78 (BP Location: Right Arm, Cuff Size: Normal)   Pulse (!) 105   Temp (!) 96.9 F (36.1 C) (Temporal)   Ht 6' (1.829 m)   Wt 178 lb 9.6 oz (81 kg)   BMI 24.22 kg/m  General:   Alert and oriented. No distress noted. Pleasant and cooperative.  Head:  Normocephalic and atraumatic. Eyes:  Conjuctiva clear without scleral icterus. Mouth:  Mask in place Abdomen:  +BS, soft, non-tender and non-distended. No rebound or guarding. No HSM or masses noted. Msk:  Symmetrical without gross deformities. Normal posture. Extremities:  Bilateral 2+ lower extremity pitting  edema to thigh, pedal edema, left leg markedly more swollen than right Neurologic:  Alert and  oriented x4 Psych:  Alert and cooperative. Normal mood and affect.  ASSESSMENT: [name] [name] is a 55 y.o. male presenting today with a history of decompensated alcoholic cirrhosis with notable lower extremity edema when initially seen early March 2022. US abdomen suspicious for cirrrhosis.  Hep B surface antigen negative, Hep C antibody negative. MELD Na 10. Ferritin markedly elevated, iron sats 93, needs hemochromatosis DNA. Recent EGD with normal esophagus, small hiatal hernia, abnormal gastric mucosa, abnormal appearing ampula and periampullary mucosa. Mild chronic gastritis.  He was brought in urgently today due to need for close follow-up and diuretic titration. With hypoalbuminemia, will continue to have persistent edema. Will attempt to adjust diuretics as well; he denies any  added salt, and he is drinking 1-2 Ensure per day.  Will increase lasix to 40 mg daily, continue aldactone 50 mg, and will check BMP in 1 week. Certainly have room to adjust this but will be cautious as he had issues with hypokalemia recently.   Left lower extremity edema: check doppler ultrasound today. Concern for DVT. If he does have this, I doubt would be able to manage as outpatient in light of comorbidities and concern for support at home.   Aldactone increased to 50 mg daily, lasix 20 mg in am last week. Need to recheck BMP. Notes chronic LLE greater edema than right. States LLE with tingling in calf. Lower extremity swelling bilaterally is persistent and similar to when seen in March 2022. Notable hypoalbuminemia.   Abnormal ampulla on EGD: will order MRI/MRCP.   Elevated ferritin and sats: I suspect multifactorial in setting of ETOH, liver disease. Will check hemochromatosis DNA.   He was applauded for continued ETOH abstinence.  PLAN:  Check hemochromatosis DNA, CMP Doppler US bilaterally MRI/MRCP in near  future Increase lasix to 40 mg daily, continue aldactone 50 mg, recheck BMP in 1 week and adjust as appropriate Return in 6-8 weeks for close follow-up  Annitta Needs, PhD, ANP-BC [facility]

## 2020-08-04 NOTE — Patient Instructions (Signed)
Continue spironolactone 2 tablets each morning, and take this with furosemide (lasix) 2 tablets each morning.   Please repeat blood work in 1 week, which will check your kidneys and electrolytes.  Drink 2 Ensure shakes a day in between your meals.   Please have ultrasound of your leg done today to make sure no blood clot.  We are arranging an MRI of your liver and bile ducts in the near future.  We will see you in 4-6 weeks! We will be arranging blood work frequently to make sure you are tolerating the fluid pills.  I enjoyed seeing you again today! As you know, I value our relationship and want to provide genuine, compassionate, and quality care. I welcome your feedback. If you receive a survey regarding your visit,  I greatly appreciate you taking time to fill this out. See you next time!  Annitta Needs, PhD, ANP-BC [facility]

## 2020-08-04 NOTE — Interval H&P Note (Signed)
History and Physical Interval Note:  08/04/2020 8:16 PM  [name]  has presented today for surgery, with the diagnosis of wt loss, loss of appetite, regurgitation.  The various methods of treatment have been discussed with the patient and family. After consideration of risks, benefits and other options for treatment, the patient has consented to  Procedure(s) with comments: ESOPHAGOGASTRODUODENOSCOPY (EGD) WITH PROPOFOL (N/A) - PM early as possible in evening BIOPSY as a surgical intervention.  The patient's history has been reviewed, patient examined, no change in status, stable for surgery.  I have reviewed the patient's chart and labs.  Questions were answered to the patient's satisfaction.     [name] [name]  No change; istat k 3.9;  EGD today per plan.  The risks, benefits, limitations, alternatives and imponderables have been reviewed with the patient. Questions have been answered. All parties are agreeable.

## 2020-08-05 ENCOUNTER — Inpatient Hospital Stay (HOSPITAL_COMMUNITY)
Admission: EM | Admit: 2020-08-05 | Discharge: 2020-08-11 | DRG: 272 | Disposition: A | Discharge status: Home / Self Care | Payer: Medicaid Other | Attending: Internal Medicine | Admitting: Internal Medicine

## 2020-08-05 ENCOUNTER — Observation Stay (HOSPITAL_COMMUNITY): Payer: Medicaid Other

## 2020-08-05 ENCOUNTER — Telehealth: Payer: Self-pay | Admitting: *Deleted

## 2020-08-05 ENCOUNTER — Encounter (HOSPITAL_COMMUNITY): Payer: Self-pay | Admitting: *Deleted

## 2020-08-05 DIAGNOSIS — R195 Other fecal abnormalities: Secondary | ICD-10-CM | POA: Diagnosis present

## 2020-08-05 DIAGNOSIS — I959 Hypotension, unspecified: Secondary | ICD-10-CM | POA: Diagnosis not present

## 2020-08-05 DIAGNOSIS — K2950 Unspecified chronic gastritis without bleeding: Secondary | ICD-10-CM | POA: Diagnosis present

## 2020-08-05 DIAGNOSIS — E7800 Pure hypercholesterolemia, unspecified: Secondary | ICD-10-CM | POA: Diagnosis present

## 2020-08-05 DIAGNOSIS — I82442 Acute embolism and thrombosis of left tibial vein: Secondary | ICD-10-CM | POA: Diagnosis not present

## 2020-08-05 DIAGNOSIS — I82492 Acute embolism and thrombosis of other specified deep vein of left lower extremity: Secondary | ICD-10-CM | POA: Diagnosis not present

## 2020-08-05 DIAGNOSIS — E8809 Other disorders of plasma-protein metabolism, not elsewhere classified: Secondary | ICD-10-CM | POA: Diagnosis not present

## 2020-08-05 DIAGNOSIS — Z86718 Personal history of other venous thrombosis and embolism: Secondary | ICD-10-CM | POA: Diagnosis not present

## 2020-08-05 DIAGNOSIS — D638 Anemia in other chronic diseases classified elsewhere: Secondary | ICD-10-CM | POA: Diagnosis not present

## 2020-08-05 DIAGNOSIS — E785 Hyperlipidemia, unspecified: Secondary | ICD-10-CM | POA: Diagnosis not present

## 2020-08-05 DIAGNOSIS — Z20822 Contact with and (suspected) exposure to covid-19: Secondary | ICD-10-CM | POA: Diagnosis not present

## 2020-08-05 DIAGNOSIS — E876 Hypokalemia: Secondary | ICD-10-CM | POA: Diagnosis present

## 2020-08-05 DIAGNOSIS — I10 Essential (primary) hypertension: Secondary | ICD-10-CM | POA: Diagnosis present

## 2020-08-05 DIAGNOSIS — I82452 Acute embolism and thrombosis of left peroneal vein: Secondary | ICD-10-CM | POA: Diagnosis not present

## 2020-08-05 DIAGNOSIS — K7031 Alcoholic cirrhosis of liver with ascites: Secondary | ICD-10-CM | POA: Diagnosis present

## 2020-08-05 DIAGNOSIS — I824Z2 Acute embolism and thrombosis of unspecified deep veins of left distal lower extremity: Secondary | ICD-10-CM

## 2020-08-05 DIAGNOSIS — T502X5A Adverse effect of carbonic-anhydrase inhibitors, benzothiadiazides and other diuretics, initial encounter: Secondary | ICD-10-CM | POA: Diagnosis not present

## 2020-08-05 DIAGNOSIS — I82409 Acute embolism and thrombosis of unspecified deep veins of unspecified lower extremity: Secondary | ICD-10-CM | POA: Diagnosis present

## 2020-08-05 DIAGNOSIS — Z86010 Personal history of colonic polyps: Secondary | ICD-10-CM

## 2020-08-05 DIAGNOSIS — Z79899 Other long term (current) drug therapy: Secondary | ICD-10-CM

## 2020-08-05 DIAGNOSIS — F1020 Alcohol dependence, uncomplicated: Secondary | ICD-10-CM | POA: Diagnosis present

## 2020-08-05 DIAGNOSIS — F17219 Nicotine dependence, cigarettes, with unspecified nicotine-induced disorders: Secondary | ICD-10-CM | POA: Diagnosis present

## 2020-08-05 DIAGNOSIS — M79605 Pain in left leg: Secondary | ICD-10-CM | POA: Diagnosis present

## 2020-08-05 DIAGNOSIS — I82402 Acute embolism and thrombosis of unspecified deep veins of left lower extremity: Secondary | ICD-10-CM | POA: Diagnosis not present

## 2020-08-05 DIAGNOSIS — I82412 Acute embolism and thrombosis of left femoral vein: Principal | ICD-10-CM | POA: Diagnosis present

## 2020-08-05 DIAGNOSIS — K7460 Unspecified cirrhosis of liver: Secondary | ICD-10-CM

## 2020-08-05 DIAGNOSIS — J449 Chronic obstructive pulmonary disease, unspecified: Secondary | ICD-10-CM | POA: Diagnosis not present

## 2020-08-05 DIAGNOSIS — K7030 Alcoholic cirrhosis of liver without ascites: Secondary | ICD-10-CM | POA: Diagnosis present

## 2020-08-05 DIAGNOSIS — Z87442 Personal history of urinary calculi: Secondary | ICD-10-CM

## 2020-08-05 DIAGNOSIS — K2920 Alcoholic gastritis without bleeding: Secondary | ICD-10-CM | POA: Diagnosis present

## 2020-08-05 LAB — COMPREHENSIVE METABOLIC PANEL
ALT: 12 U/L (ref 0–44)
AST: 31 U/L (ref 15–41)
Albumin: 1.8 g/dL — ABNORMAL LOW (ref 3.5–5.0)
Alkaline Phosphatase: 95 U/L (ref 38–126)
Anion gap: 9 (ref 5–15)
BUN: <5 mg/dL — ABNORMAL LOW (ref 6–20)
CO2: 22 mmol/L (ref 22–32)
Calcium: 7.5 mg/dL — ABNORMAL LOW (ref 8.9–10.3)
Chloride: 105 mmol/L (ref 98–111)
Creatinine, Ser: 1.06 mg/dL (ref 0.61–1.24)
GFR, Estimated: >60 mL/min (ref >60)
Glucose, Bld: 89 mg/dL (ref 70–99)
Potassium: 3 mmol/L — ABNORMAL LOW (ref 3.5–5.1)
Sodium: 136 mmol/L (ref 135–145)
Total Bilirubin: 0.6 mg/dL (ref 0.3–1.2)
Total Protein: 6.7 g/dL (ref 6.5–8.1)

## 2020-08-05 LAB — CBC WITH DIFFERENTIAL/PLATELET
Abs Immature Granulocytes: 0.03 10*3/uL (ref 0.00–0.07)
Basophils Absolute: 0.1 10*3/uL (ref 0.0–0.1)
Basophils Relative: 1 %
Eosinophils Absolute: 0.1 10*3/uL (ref 0.0–0.5)
Eosinophils Relative: 1 %
HCT: 37.1 % — ABNORMAL LOW (ref 39.0–52.0)
Hemoglobin: 12.2 g/dL — ABNORMAL LOW (ref 13.0–17.0)
Immature Granulocytes: 0 %
Lymphocytes Relative: 19 %
Lymphs Abs: 2 10*3/uL (ref 0.7–4.0)
MCH: 30 pg (ref 26.0–34.0)
MCHC: 32.9 g/dL (ref 30.0–36.0)
MCV: 91.4 fL (ref 80.0–100.0)
Monocytes Absolute: 1.3 10*3/uL — ABNORMAL HIGH (ref 0.1–1.0)
Monocytes Relative: 12 %
Neutro Abs: 6.9 10*3/uL (ref 1.7–7.7)
Neutrophils Relative %: 67 %
Platelets: 452 10*3/uL — ABNORMAL HIGH (ref 150–400)
RBC: 4.06 MIL/uL — ABNORMAL LOW (ref 4.22–5.81)
RDW: 14.6 % (ref 11.5–15.5)
WBC: 10.4 10*3/uL (ref 4.0–10.5)
nRBC: 0 % (ref 0.0–0.2)

## 2020-08-05 LAB — HEPARIN LEVEL (UNFRACTIONATED)
Heparin Unfractionated: 0.38 IU/mL (ref 0.30–0.70)
Heparin Unfractionated: 0.23 IU/mL — ABNORMAL LOW (ref 0.30–0.70)

## 2020-08-05 LAB — RESP PANEL BY RT-PCR (FLU A&B, COVID) ARPGX2
Influenza A by PCR: NEGATIVE
Influenza B by PCR: NEGATIVE
SARS Coronavirus 2 by RT PCR: NEGATIVE

## 2020-08-05 LAB — MAGNESIUM: Magnesium: 1.5 mg/dL — ABNORMAL LOW (ref 1.7–2.4)

## 2020-08-05 LAB — HIV ANTIBODY (ROUTINE TESTING W REFLEX): HIV Screen 4th Generation wRfx: NONREACTIVE

## 2020-08-05 LAB — PROTIME-INR
INR: 1.2 (ref 0.8–1.2)
Prothrombin Time: 14.3 seconds (ref 11.4–15.2)

## 2020-08-05 MED ORDER — SODIUM CHLORIDE 0.9% FLUSH: 3.0000 mL | Freq: Two times a day (BID) | INTRAVENOUS | Status: DC

## 2020-08-05 MED ORDER — POLYETHYLENE GLYCOL 3350 17 G PO PACK: 17.0000 g | PACK | Freq: Every day | ORAL | Status: DC | PRN

## 2020-08-05 MED ORDER — ONDANSETRON HCL 4 MG/2ML IJ SOLN: 4.0000 mg | Freq: Four times a day (QID) | INTRAMUSCULAR | Status: DC | PRN

## 2020-08-05 MED ORDER — HEPARIN (PORCINE) 25000 UT/250ML-% IV SOLN
1300.0000 [IU]/h | INTRAVENOUS | Status: DC
  Administered 2020-08-05: 1500 [IU]/h via INTRAVENOUS
  Administered 2020-08-05 – 2020-08-10 (×4): 1300 [IU]/h via INTRAVENOUS
  Filled 2020-08-05 (×7): qty 250

## 2020-08-05 MED ORDER — FUROSEMIDE 20 MG PO TABS
20.0000 mg | ORAL_TABLET | Freq: Every day | ORAL | Status: DC
  Administered 2020-08-07 – 2020-08-11 (×5): 20 mg via ORAL
  Filled 2020-08-05 (×6): qty 1

## 2020-08-05 MED ORDER — IOHEXOL 350 MG/ML SOLN
125.0000 mL | Freq: Once | INTRAVENOUS | Status: AC | PRN
  Administered 2020-08-05: 125 mL via INTRAVENOUS

## 2020-08-05 MED ORDER — MOMETASONE FURO-FORMOTEROL FUM 200-5 MCG/ACT IN AERO
2.0000 | INHALATION_SPRAY | Freq: Two times a day (BID) | RESPIRATORY_TRACT | Status: DC
  Administered 2020-08-06 – 2020-08-11 (×11): 2 via RESPIRATORY_TRACT
  Filled 2020-08-05: qty 8.8

## 2020-08-05 MED ORDER — ACETAMINOPHEN 650 MG RE SUPP: 650.0000 mg | Freq: Four times a day (QID) | RECTAL | Status: DC | PRN

## 2020-08-05 MED ORDER — SODIUM CHLORIDE 0.9 % IV SOLN: 250.0000 mL | INTRAVENOUS | Status: DC | PRN

## 2020-08-05 MED ORDER — BISACODYL 10 MG RE SUPP: 10.0000 mg | Freq: Every day | RECTAL | Status: DC | PRN

## 2020-08-05 MED ORDER — OXYCODONE HCL 5 MG PO TABS
5.0000 mg | ORAL_TABLET | ORAL | Status: DC | PRN
  Administered 2020-08-08 – 2020-08-11 (×6): 5 mg via ORAL
  Filled 2020-08-05 (×6): qty 1

## 2020-08-05 MED ORDER — ONDANSETRON HCL 4 MG PO TABS: 4.0000 mg | ORAL_TABLET | Freq: Four times a day (QID) | ORAL | Status: DC | PRN

## 2020-08-05 MED ORDER — ACETAMINOPHEN 325 MG PO TABS: 650.0000 mg | ORAL_TABLET | Freq: Four times a day (QID) | ORAL | Status: DC | PRN

## 2020-08-05 MED ORDER — ALBUTEROL SULFATE HFA 108 (90 BASE) MCG/ACT IN AERS
2.0000 | INHALATION_SPRAY | RESPIRATORY_TRACT | Status: DC | PRN
  Filled 2020-08-05: qty 6.7

## 2020-08-05 MED ORDER — PANTOPRAZOLE SODIUM 40 MG PO TBEC
40.0000 mg | DELAYED_RELEASE_TABLET | Freq: Every day | ORAL | Status: DC
  Administered 2020-08-06 – 2020-08-11 (×6): 40 mg via ORAL
  Filled 2020-08-05 (×6): qty 1

## 2020-08-05 MED ORDER — POTASSIUM CHLORIDE CRYS ER 20 MEQ PO TBCR
40.0000 meq | EXTENDED_RELEASE_TABLET | ORAL | Status: AC
  Administered 2020-08-05 (×2): 40 meq via ORAL
  Filled 2020-08-05 (×2): qty 2

## 2020-08-05 MED ORDER — LABETALOL HCL 5 MG/ML IV SOLN: 10.0000 mg | INTRAVENOUS | Status: DC | PRN

## 2020-08-05 MED ORDER — POTASSIUM CHLORIDE CRYS ER 20 MEQ PO TBCR
40.0000 meq | EXTENDED_RELEASE_TABLET | Freq: Once | ORAL | Status: AC
  Administered 2020-08-05: 40 meq via ORAL
  Filled 2020-08-05: qty 2

## 2020-08-05 MED ORDER — HEPARIN BOLUS VIA INFUSION
5000.0000 [IU] | Freq: Once | INTRAVENOUS | Status: AC
  Administered 2020-08-05: 5000 [IU] via INTRAVENOUS

## 2020-08-05 MED ORDER — HEPARIN BOLUS VIA INFUSION
2000.0000 [IU] | Freq: Once | INTRAVENOUS | Status: AC
  Administered 2020-08-05: 2000 [IU] via INTRAVENOUS
  Filled 2020-08-05: qty 2000

## 2020-08-05 MED ORDER — ATORVASTATIN CALCIUM 10 MG PO TABS
20.0000 mg | ORAL_TABLET | Freq: Every day | ORAL | Status: DC
  Administered 2020-08-06 – 2020-08-11 (×6): 20 mg via ORAL
  Filled 2020-08-05 (×7): qty 2

## 2020-08-05 MED ORDER — SODIUM CHLORIDE 0.9% FLUSH: 3.0000 mL | INTRAVENOUS | Status: DC | PRN

## 2020-08-05 MED ORDER — FENTANYL CITRATE (PF) 100 MCG/2ML IJ SOLN: 25.0000 ug | INTRAMUSCULAR | Status: DC | PRN

## 2020-08-05 NOTE — Progress Notes (Signed)
ANTICOAGULATION CONSULT NOTE  Pharmacy Consult for heparin Indication: DVT  No Known Allergies  Patient Measurements: Height: 6' (182.9 cm) Weight: 80.7 kg (178 lb) IBW/kg (Calculated) : 77.6 Heparin Dosing Weight: 80.7 kg  Vital Signs: Temp: 98.1 F (36.7 C) (04/07 2002) Temp Source: Oral (04/07 2002) BP: 103/74 (04/07 2002) Pulse Rate: 96 (04/07 2002)  Labs: Recent Labs    08/05/20 1236 08/05/20 2012  HGB 12.2*  --   HCT 37.1*  --   PLT 452*  --   LABPROT 14.3  --   INR 1.2  --   HEPARINUNFRC  --  0.23*  CREATININE 1.06  --     Estimated Creatinine Clearance: 86.4 mL/min (by C-G formula based on SCr of 1.06 mg/dL).   Medical History: Past Medical History:  Diagnosis Date  . Asthma   . Dyspnea   . High cholesterol   . History of kidney stones   . Hypertension   . Kidney stones     Medications:  Medications Prior to Admission  Medication Sig Dispense Refill Last Dose  . albuterol (PROVENTIL HFA) 108 (90 Base) MCG/ACT inhaler INHALE 2 PUFFS BY MOUTH EVERY 6 HOURS AS NEEDED FOR COUGHING, WHEEZING, OR SHORTNESS OF BREATH (Patient taking differently: Inhale 1-2 puffs into the lungs every 6 (six) hours as needed for wheezing or shortness of breath.) 20.1 g 1 Past Week at Unknown time  . amLODipine (NORVASC) 10 MG tablet Take 10 mg by mouth in the morning.   08/05/2020 at Unknown time  . atorvastatin (LIPITOR) 20 MG tablet Take 1 tablet (20 mg total) by mouth daily. (Patient taking differently: Take 20 mg by mouth in the morning.) 90 tablet 2 08/05/2020 at Unknown time  . furosemide (LASIX) 20 MG tablet Take 1 tablet (20 mg total) by mouth daily. 30 tablet 5 08/05/2020 at Unknown time  . lisinopril (ZESTRIL) 10 MG tablet Take 10 mg by mouth in the morning.   08/05/2020 at Unknown time  . mometasone-formoterol (DULERA) 200-5 MCG/ACT AERO Inhale 2 puffs into the lungs 2 (two) times daily. (Patient taking differently: Inhale 2 puffs into the lungs 2 (two) times daily as needed  (wheezing/shortness of breath).) 39 g 3 Past Week at Unknown time  . pantoprazole (PROTONIX) 40 MG tablet Take 1 tablet (40 mg total) by mouth daily. Take 30 minutes 30 tablet 3 08/05/2020 at Unknown time  . spironolactone (ALDACTONE) 25 MG tablet Take 25 mg by mouth 2 (two) times daily.   08/05/2020 at Unknown time  . potassium chloride (KLOR-CON) 10 MEQ tablet Take 4 tablets (40 mEq total) by mouth 2 (two) times daily for 3 days. 24 tablet 0     Assessment: Pharmacy consulted to dose heparin in patient with DVT. Patient is not on anticoagulation prior to admission. CBC wnl. Initial heparin level subtherapeutic at 0.23. VVS planning mechanical thrombectomy tomorrow.  Goal of Therapy:  Heparin level 0.3-0.7 units/ml Monitor platelets by anticoagulation protocol: Yes   Plan:  Heparin 2000 units x1 Increase heparin to 1500 units/h   Arrie Senate, PharmD, [place], [facility] Clinical Pharmacist [phone] Please check AMION for all [place] numbers 08/05/2020

## 2020-08-05 NOTE — Progress Notes (Signed)
Pt received from [place] [facility]. VSS. CHG complete. Pt oriented to room and unit. Call light in reach.  Clyde Canterbury, RN

## 2020-08-05 NOTE — ED Provider Notes (Signed)
[facility] EMERGENCY DEPARTMENT Provider Note   CSN: [id number] Arrival date & time: 08/05/20  1037     History Chief Complaint  Patient presents with  . Leg Pain    [name] [name] is a 55 y.o. male with PMHx HTN, High cholesterol, and recent findings of alcoholic cirrhosis who presents to the ED today with complaint of BLE swelling and pain for the past month.  Patient reports that he saw his gastroenterologist yesterday for work-up for his liver and had an ultrasound ordered.  He was called yesterday and told he needed to come to the ED for further evaluation however could not come yesterday prompting him to come to the ED today.  He reports pain bilaterally however worse in the left lower extremity.  He states the pain extends up his entire left lower extremity.  The pain in his right lower extremity stops about mid calf.  He denies any chest pain or shortness of breath including dyspnea on exertion or orthopnea.  He denies any history of DVT/PE.  Denies any recent prolonged travel or immobilization.  Denies any exogenous hormone use.  No active malignancy.    LEFT LOWER EXTREMITY Common Femoral Vein: There is acute appearing deep venous thrombosis in the left common femoral vein. There is no appreciable flow in this vessel. No compression or augmentation evident.  Saphenofemoral Junction: No evidence of thrombus. Normal compressibility and flow on color Doppler imaging.  Profunda Femoral Vein: There is acute appearing deep venous thrombosis in the profunda femoral vein. No appreciable flow in this vessel. No compression or augmentation evident.  Femoral Vein: There is acute appearing deep venous thrombosis throughout the left femoral vein without appreciable flow evident. No compression or augmentation evident.  Popliteal Vein: Acute appearing deep venous thrombosis throughout the left popliteal vein without appreciable flow evident. No compression or augmentation  evident.  Calf Veins: Acute appearing deep venous thrombosis in the peroneal and posterior tibial veins with essentially absent flow in these vessels. Loss of compression augmentation in these vessels. Anterior tibial veins appear patent.  Superficial Great Saphenous Vein: No evidence of thrombus. Normal compressibility.  Venous Reflux:  None.  Other Findings:  Mildly prominent left inguinal lymph nodes noted.  IMPRESSION: 1. Extensive deep venous thrombosis throughout the left lower extremity venous structures.  2.  No deep venous thrombosis in the right lower extremity.  3.  Prominent inguinal lymph nodes bilaterally.  The history is provided by the patient and medical records.       Past Medical History:  Diagnosis Date  . Asthma   . Dyspnea   . High cholesterol   . History of kidney stones   . Hypertension   . Kidney stones     Patient Active Problem List   Diagnosis Date Noted  . DVT (deep venous thrombosis) ([place]) 08/05/2020  . Hepatic cirrhosis ([place]) 08/04/2020  . Edema of left lower extremity 08/04/2020  . Loss of weight 07/07/2020  . Loss of appetite 07/07/2020  . Anasarca 07/07/2020  . Heme + stool 01/22/2017  . Abnormal LFTs 01/22/2017  . Hyperlipidemia 02/08/2015  . Essential hypertension, benign 02/08/2015  . Cigarette nicotine dependence with nicotine-induced disorder 02/08/2015  . Leukocytosis, unspecified 01/07/2013  . Hyponatremia 01/07/2013  . Kidney stone 01/06/2013  . Ureteral colic 38/45/3646    Past Surgical History:  Procedure Laterality Date  . APPENDECTOMY    . BIOPSY  07/22/2020   Procedure: BIOPSY;  Surgeon: Daneil Dolin, MD;  Location: [facility]  ENDO SUITE;  Service: Endoscopy;;  . COLONOSCOPY WITH PROPOFOL N/A 04/18/2017   non-bleeding internal hemorrhoids, two 4-6 mm polyps in descending colon and cecum, pancolonic diverticulosis, single cecal AVM. Tubular adenomas, surveillance in 2023.   Marland Kitchen ESOPHAGOGASTRODUODENOSCOPY (EGD)  WITH PROPOFOL N/A 07/22/2020   normal esophagus, small hiatal hernia, abnormal gastric mucosa, abnormal appearing ampula and periampullary mucosa. Mild chronic gastritis.  Marland Kitchen FRACTURE SURGERY     left arm  . POLYPECTOMY  04/18/2017   Procedure: POLYPECTOMY;  Surgeon: Daneil Dolin, MD;  Location: [facility] ENDO SUITE;  Service: Endoscopy;;       Family History  Problem Relation Age of Onset  . Cancer Father        throat  . Diabetes Sister   . Colon cancer Neg Hx     Social History   Tobacco Use  . Smoking status: Current Every Day Smoker    Packs/day: 0.50    Years: 33.00    Pack years: 16.50    Types: Cigarettes  . Smokeless tobacco: Never Used  Vaping Use  . Vaping Use: Never used  Substance Use Topics  . 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 Drug use: No    Home Medications Prior to Admission medications   Medication Sig Start Date End Date Taking? Authorizing Provider  albuterol (PROVENTIL HFA) 108 (90 Base) MCG/ACT inhaler INHALE 2 PUFFS BY MOUTH EVERY 6 HOURS AS NEEDED FOR COUGHING, WHEEZING, OR SHORTNESS OF BREATH Patient taking differently: Inhale 1-2 puffs into the lungs every 6 (six) hours as needed for wheezing or shortness of breath. 06/02/19  Yes Soyla Dryer, PA-C  amLODipine (NORVASC) 10 MG tablet Take 10 mg by mouth in the morning. 04/02/20  Yes [provider]  atorvastatin (LIPITOR) 20 MG tablet Take 1 tablet (20 mg total) by mouth daily. Patient taking differently: Take 20 mg by mouth in the morning. 06/02/19  Yes Soyla Dryer, PA-C  furosemide (LASIX) 20 MG tablet Take 1 tablet (20 mg total) by mouth daily. 07/27/20  Yes Annitta Needs, NP  lisinopril (ZESTRIL) 10 MG tablet Take 10 mg by mouth in the morning. 04/02/20  Yes [provider]  mometasone-formoterol (DULERA) 200-5 MCG/ACT AERO Inhale 2 puffs into the lungs 2 (two) times daily. Patient  taking differently: Inhale 2 puffs into the lungs 2 (two) times daily as needed (wheezing/shortness of breath). 06/02/19  Yes Soyla Dryer, PA-C  pantoprazole (PROTONIX) 40 MG tablet Take 1 tablet (40 mg total) by mouth daily. Take 30 minutes 07/07/20  Yes Annitta Needs, NP  spironolactone (ALDACTONE) 25 MG tablet Take 25 mg by mouth 2 (two) times daily. 05/13/20  Yes [provider]  potassium chloride (KLOR-CON) 10 MEQ tablet Take 4 tablets (40 mEq total) by mouth 2 (two) times daily for 3 days. 07/20/20 07/23/20  Annitta Needs, NP    Allergies    Patient has no known allergies.  Review of Systems   Review of Systems  Constitutional: Negative for chills and fever.  Respiratory: Negative for shortness of breath.   Cardiovascular: Positive for leg swelling. Negative for chest pain.  Musculoskeletal: Positive for arthralgias.  All other systems reviewed and are negative.   Physical Exam Updated Vital Signs BP 103/70   Pulse (!) 104   Temp 98.4 F (36.9 C) (Oral)   Resp 20   Ht 6' (1.829  m)   Wt 80.7 kg   BMI 24.14 kg/m   Physical Exam Vitals and nursing note reviewed.  Constitutional:      Appearance: He is not ill-appearing or diaphoretic.  HENT:     Head: Normocephalic and atraumatic.  Eyes:     Conjunctiva/sclera: Conjunctivae normal.  Cardiovascular:     Rate and Rhythm: Regular rhythm. Tachycardia present.     Pulses: Normal pulses.  Pulmonary:     Effort: Pulmonary effort is normal.     Breath sounds: Normal breath sounds. No wheezing, rhonchi or rales.  Abdominal:     Palpations: Abdomen is soft.     Tenderness: There is no abdominal tenderness.  Musculoskeletal:     Cervical back: Neck supple.     Comments: BLE edema however worse on left compared to right; 1+ pitting edema bilaterally. + Diffuse BLE TTP. 2+ dopplerable DP pulses bilaterally.   Skin:    General: Skin is warm and dry.  Neurological:     Mental Status: He is alert.     ED Results /  Procedures / Treatments   Labs (all labs ordered are listed, but only abnormal results are displayed) Labs Reviewed  COMPREHENSIVE METABOLIC PANEL - Abnormal; Notable for the following components:      Result Value   Potassium 3.0 (*)    BUN <5 (*)    Calcium 7.5 (*)    Albumin 1.8 (*)    All other components within normal limits  CBC WITH DIFFERENTIAL/PLATELET - Abnormal; Notable for the following components:   RBC 4.06 (*)    Hemoglobin 12.2 (*)    HCT 37.1 (*)    Platelets 452 (*)    Monocytes Absolute 1.3 (*)    All other components within normal limits  RESP PANEL BY RT-PCR (FLU A&B, COVID) ARPGX2  HEPARIN LEVEL (UNFRACTIONATED)  PROTIME-INR    EKG None  Radiology US Venous Img Lower Bilateral (DVT)  Result Date: 08/04/2020 CLINICAL DATA:  Bilateral lower extremity edema EXAM: BILATERAL LOWER EXTREMITY VENOUS DOPPLER ULTRASOUND TECHNIQUE: Gray-scale sonography with graded compression, as well as color Doppler and duplex ultrasound were performed to evaluate the lower extremity deep venous systems from the level of the common femoral vein and including the common femoral, femoral, profunda femoral, popliteal and calf veins including the posterior tibial, peroneal and gastrocnemius veins when visible. The superficial great saphenous vein was also interrogated. Spectral Doppler was utilized to evaluate flow at rest and with distal augmentation maneuvers in the common femoral, femoral and popliteal veins. COMPARISON:  None. FINDINGS: RIGHT LOWER EXTREMITY Common Femoral Vein: No evidence of thrombus. Normal compressibility, respiratory phasicity and response to augmentation. Saphenofemoral Junction: No evidence of thrombus. Normal compressibility and flow on color Doppler imaging. Profunda Femoral Vein: No evidence of thrombus. Normal compressibility and flow on color Doppler imaging. Femoral Vein: No evidence of thrombus. Normal compressibility, respiratory phasicity and response to  augmentation. Popliteal Vein: No evidence of thrombus. Normal compressibility, respiratory phasicity and response to augmentation. Calf Veins: No evidence of thrombus. Normal compressibility and flow on color Doppler imaging. Superficial Great Saphenous Vein: No evidence of thrombus. Normal compressibility. Venous Reflux:  None. Other Findings: Mildly prominent right inguinal lymph nodes noted. LEFT LOWER EXTREMITY Common Femoral Vein: There is acute appearing deep venous thrombosis in the left common femoral vein. There is no appreciable flow in this vessel. No compression or augmentation evident. Saphenofemoral Junction: No evidence of thrombus. Normal compressibility and flow on color Doppler imaging. Profunda Femoral  Vein: There is acute appearing deep venous thrombosis in the profunda femoral vein. No appreciable flow in this vessel. No compression or augmentation evident. Femoral Vein: There is acute appearing deep venous thrombosis throughout the left femoral vein without appreciable flow evident. No compression or augmentation evident. Popliteal Vein: Acute appearing deep venous thrombosis throughout the left popliteal vein without appreciable flow evident. No compression or augmentation evident. Calf Veins: Acute appearing deep venous thrombosis in the peroneal and posterior tibial veins with essentially absent flow in these vessels. Loss of compression augmentation in these vessels. Anterior tibial veins appear patent. Superficial Great Saphenous Vein: No evidence of thrombus. Normal compressibility. Venous Reflux:  None. Other Findings:  Mildly prominent left inguinal lymph nodes noted. IMPRESSION: 1. Extensive deep venous thrombosis throughout the left lower extremity venous structures. 2.  No deep venous thrombosis in the right lower extremity. 3.  Prominent inguinal lymph nodes bilaterally. These results will be called to the ordering clinician or representative by the Radiology Department at the  imaging location. Electronically Signed   By: Lowella Grip III M.D.   On: 08/04/2020 15:03    Procedures .Critical Care Performed by: Eustaquio Maize, PA-C Authorized by: Eustaquio Maize, PA-C   Critical care provider statement:    Critical care time (minutes):  45   Critical care was time spent personally by me on the following activities:  Discussions with consultants, evaluation of patient's response to treatment, examination of patient, ordering and performing treatments and interventions, ordering and review of laboratory studies, ordering and review of radiographic studies, pulse oximetry, re-evaluation of patient's condition, obtaining history from patient or surrogate and review of old charts     Medications Ordered in ED Medications  heparin ADULT infusion 100 units/mL (25000 units/239mL) (1,300 Units/hr Intravenous New Bag/Given 08/05/20 1228)  potassium chloride SA (KLOR-CON) CR tablet 40 mEq (has no administration in time range)  potassium chloride SA (KLOR-CON) CR tablet 40 mEq (has no administration in time range)  oxyCODONE (Oxy IR/ROXICODONE) immediate release tablet 5 mg (has no administration in time range)  heparin bolus via infusion 5,000 Units (5,000 Units Intravenous Bolus from Bag 08/05/20 1228)    ED Course  I have reviewed the triage vital signs and the nursing notes.  Pertinent labs & imaging results that were available during my care of the patient were reviewed by me and considered in my medical decision making (see chart for details).  Clinical Course as of 08/05/20 1508  Thu Aug 05, 2020  1331 Potassium(!): 3.0 [MV]    Clinical Course User Index [MV] Eustaquio Maize, [place]   MDM Rules/Calculators/A&P                          55 year old male presents to the ED today after having an ultrasound yesterday which showed positive extensive DVT in the left lower extremity.  Has been having bilateral lower extremity pain and swelling for the past month.   Seen by his gastroenterologist yesterday with work-up for new onset alcoholic cirrhosis with concern for possible DVT in the left lower extremity.  Patient could not find a ride yesterday prompting him to come to the ED today.  On arrival to the ED patient is tachycardic at 104.  He is not tachypneic.  He denies any chest pain or shortness of breath.  He continues to be tachycardic on my exam however.  Will obtain EKG.  Will obtain lab work today to check kidney function.  Given  extent of left lower extremity DVT will consult vascular surgery for further recommendations.  Patient may likely require admission given extent.  Question if he can stay here at any [facility] or need to be transferred to [facility].  Discussed case with Vascular Surgeon Dr. Donzetta Matters; recommends heparin and admission with transfer to [facility]. He will see patient once he arrives at [facility]. Labwork has not been collected yet - will await labwork prior to calling for admission. Heparin started at this time and COVID ordered.   EKG with NSR with HR 88  CBC without leukocytosis. Hgb 12.2 CMP with potassium 3.0; will replete. LFTs unremarkable at this time. Albumin low 1.8.  COVID negative  Discussed case with [facility] Dr. Denton Brick who agrees to accept patient for admission with plans to transfer to [facility].   This note was prepared using Dragon voice recognition software and may include unintentional dictation errors due to the inherent limitations of voice recognition software.   Final Clinical Impression(s) / ED Diagnoses Final diagnoses:  DVT, lower extremity, distal, acute, left ([place])  Cirrhosis of liver without ascites, unspecified hepatic cirrhosis type ([place])  Hypokalemia    Rx / DC Orders ED Discharge Orders    None       Eustaquio Maize, PA-C 08/05/20 1508    Milton Ferguson, MD 08/06/20 [phone]

## 2020-08-05 NOTE — H&P (Signed)
Patient Demographics:    [name] [name], is a 55 y.o. male  MRN: [id number]   DOB - June 16, 1965  Admit Date - 08/05/2020  Outpatient Primary MD for the patient is Rosita Fire, MD   Assessment & Plan:    Principal Problem:   DVT (deep venous thrombosis) -Lt LL Extensive DVT Active Problems:   Alcoholic cirrhosis of liver with ascites (HCC)   COPD (chronic obstructive pulmonary disease) (HCC)   Hypokalemia   Essential hypertension, benign   Cigarette nicotine dependence with nicotine-induced disorder    1) extensive left lower extremity DVT--- IV heparin, awaiting official vascular surgery consult from Dr. Dione Plover -No chest pains or pleuritic symptoms -Left lower extremity venous Dopplers from 08/04/2020 showed Extensive deep venous thrombosis throughout the left lower extremity venous structures -CT abdomen and pelvis pending  2) alcoholic liver cirrhosis and chronic alcoholic gastritis-- US abdomen suspicious for cirrrhosis. Hep B surface antigen negative, Hep C antibody negative. MELD Na 10. Ferritin markedly elevated, iron sats 93,   hemochromatosis DNA pending. -- Recent EGD with normal esophagus, small hiatal hernia, abnormal gastric mucosa, abnormal appearing ampula and periampullary mucosa. Mild chronic gastritis. -Patient will need MRI/ MRCP mostly due to Abnormal ampulla on EGD -Albulmin is 1.8, INR is 1.2, LFTs are not elevated, AFP is 2.0 -Please consider official GI consult while at [facility] once stable from a vascular/DVT standpoint -Last alcoholic intake apparently was in February 2022  3) tobacco abuse/COPD----no acute exacerbation at this time, nicotine patch and bronchodilators as ordered, smoking cessation advised  4) hypokalemia--- due to diuretic use, replace and  recheck  5) acute anemia--- patient with normocytic normochromic anemia, hemoglobin down to 12.2 from a baseline usually between 13 and 14 -Low index of suspicion for ongoing GI bleed.  Check stool for occult blood given history of alcoholic gastritis and liver cirrhosis -Recent ferritin 1254, serum iron 147, and saturation 93%  6)HTN--- blood pressure is soft and patient is scheduled to get a CT with contrast--- so hold lisinopril and Aldactone to avoid AKI -Continue Lasix -May use IV labetalol as needed elevated BP  7)HLD-continue atorvastatin  Disposition/Need for in-Hospital Stay- patient unable to be discharged at this time due to acute extensive left lower extremity DVT requiring IV heparin on vascular surgery intervention  Dispo: The patient is from: Home              Anticipated d/c is to: Home              Anticipated d/c date is: 1 day              Patient currently is not medically stable to d/c. Barriers: Not Clinically Stable-    With History of - Reviewed by me  Past Medical History:  Diagnosis Date  . Asthma   . Dyspnea   . High cholesterol   . History of kidney stones   . Hypertension   .  Kidney stones       Past Surgical History:  Procedure Laterality Date  . APPENDECTOMY    . BIOPSY  07/22/2020   Procedure: BIOPSY;  Surgeon: Daneil Dolin, MD;  Location: [facility];  Service: Endoscopy;;  . COLONOSCOPY WITH PROPOFOL N/A 04/18/2017   non-bleeding internal hemorrhoids, two 4-6 mm polyps in descending colon and cecum, pancolonic diverticulosis, single cecal AVM. Tubular adenomas, surveillance in 2023.   Marland Kitchen ESOPHAGOGASTRODUODENOSCOPY (EGD) WITH PROPOFOL N/A 07/22/2020   normal esophagus, small hiatal hernia, abnormal gastric mucosa, abnormal appearing ampula and periampullary mucosa. Mild chronic gastritis.  Marland Kitchen FRACTURE SURGERY     left arm  . POLYPECTOMY  04/18/2017   Procedure: POLYPECTOMY;  Surgeon: Daneil Dolin, MD;  Location: [facility];   Service: Endoscopy;;    Chief Complaint  Patient presents with  . Leg Pain      HPI:    [name] [name]  is a 55 y.o. male with past medical history relevant for alcoholic liver disease (last EtOH intake was February), HTN,HLD, tobacco abuse/COPD who has had complaints of  wt loss, loss of appetite, regurgitation as well as worsening edema especially of the lower extremities despite Lasix and Aldactone use -He was seen by GI provider on 08/04/2020, lower extremity Dopplers were ordered due to worsening left leg swelling and discomfort, -Venous Dopplers demonstrate extensive left lower extremity DVT -Patient denies chest pains, no increased shortness of breath, no pleuritic symptoms -EDP discussed case with Dr. Donzetta Matters from vascular surgery who recommended transfer to [facility] for further vascular surgery evaluation -I personally called and spoke with Dr. Donzetta Matters and notified him the patient has now arrived at [facility] No fever  Or chills  No Nausea, Vomiting or Diarrhea --Albulmin is 1.8, INR is 1.2, LFTs are not elevated -Hgb is 12.2 -EKG sinus rhythm     Review of systems:    In addition to the HPI above,   A full Review of  Systems was done, all other systems reviewed are negative except as noted above in HPI , .    Social History:  Reviewed by me    Social History   Tobacco Use  . Smoking status: Current Every Day Smoker    Packs/day: 0.50    Years: 33.00    Pack years: 16.50    Types: Cigarettes  . Smokeless tobacco: Never Used  Substance Use Topics  . 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 Cancer Father        throat  . Diabetes Sister   . Colon cancer Neg Hx      Home Medications:   Prior to Admission medications   Medication Sig Start Date End Date Taking? Authorizing  Provider  albuterol (PROVENTIL HFA) 108 (90 Base) MCG/ACT inhaler INHALE 2 PUFFS BY MOUTH EVERY 6 HOURS AS NEEDED FOR COUGHING, WHEEZING, OR SHORTNESS OF BREATH Patient taking differently: Inhale 1-2 puffs into the lungs every 6 (six) hours as needed for wheezing or shortness of breath. 06/02/19  Yes Soyla Dryer, PA-C  amLODipine (NORVASC) 10 MG tablet Take 10 mg by mouth in the morning. 04/02/20  Yes [provider]  atorvastatin (LIPITOR) 20 MG tablet Take 1 tablet (20 mg total) by  mouth daily. Patient taking differently: Take 20 mg by mouth in the morning. 06/02/19  Yes Soyla Dryer, PA-C  furosemide (LASIX) 20 MG tablet Take 1 tablet (20 mg total) by mouth daily. 07/27/20  Yes Annitta Needs, NP  lisinopril (ZESTRIL) 10 MG tablet Take 10 mg by mouth in the morning. 04/02/20  Yes [provider]  mometasone-formoterol (DULERA) 200-5 MCG/ACT AERO Inhale 2 puffs into the lungs 2 (two) times daily. Patient taking differently: Inhale 2 puffs into the lungs 2 (two) times daily as needed (wheezing/shortness of breath). 06/02/19  Yes Soyla Dryer, PA-C  pantoprazole (PROTONIX) 40 MG tablet Take 1 tablet (40 mg total) by mouth daily. Take 30 minutes 07/07/20  Yes Annitta Needs, NP  spironolactone (ALDACTONE) 25 MG tablet Take 25 mg by mouth 2 (two) times daily. 05/13/20  Yes [provider]  potassium chloride (KLOR-CON) 10 MEQ tablet Take 4 tablets (40 mEq total) by mouth 2 (two) times daily for 3 days. 07/20/20 07/23/20  Annitta Needs, NP     Allergies:    No Known Allergies   Physical Exam:   Vitals  Blood pressure 120/80, pulse (!) 108, temperature 98.4 F (36.9 C), temperature source Oral, resp. rate 18, height 6' (1.829 m), weight 80.7 kg, SpO2 100 %.  Physical Examination: General appearance - alert,  and in no distress  Mental status - alert, oriented to person, place, and time,  Eyes - sclera anicteric Neck - supple, no JVD elevation , Chest -  fair air  movement, no wheezing Heart - S1 and S2 normal, irregular, tachycardic Abdomen - soft, nontender, nondistended, +bs Neurological - screening mental status exam normal, neck supple without rigidity, cranial nerves II through XII intact, DTR's normal and symmetric Extremities -bilateral lower extremity swelling ,left significantly more than right, calf tenderness noted Skin - warm, dry     Data Review:    CBC Recent Labs  Lab 08/05/20 1236  WBC 10.4  HGB 12.2*  HCT 37.1*  PLT 452*  MCV 91.4  MCH 30.0  MCHC 32.9  RDW 14.6  LYMPHSABS 2.0  MONOABS 1.3*  EOSABS 0.1  BASOSABS 0.1   ------------------------------------------------------------------------------------------------------------------  Chemistries  Recent Labs  Lab 08/05/20 1236  NA 136  K 3.0*  CL 105  CO2 22  GLUCOSE 89  BUN <5*  CREATININE 1.06  CALCIUM 7.5*  AST 31  ALT 12  ALKPHOS 95  BILITOT 0.6   ------------------------------------------------------------------------------------------------------------------ estimated creatinine clearance is 86.4 mL/min (by C-G formula based on SCr of 1.06 mg/dL). ------------------------------------------------------------------------------------------------------------------ No results for input(s): TSH, T4TOTAL, T3FREE, THYROIDAB in the last 72 hours.  Invalid input(s): FREET3   Coagulation profile Recent Labs  Lab 08/05/20 1236  INR 1.2   ------------------------------------------------------------------------------------------------------------------- No results for input(s): DDIMER in the last 72 hours. -------------------------------------------------------------------------------------------------------------------  Cardiac Enzymes No results for input(s): CKMB, TROPONINI, MYOGLOBIN in the last 168 hours.  Invalid input(s): CK ------------------------------------------------------------------------------------------------------------------ No  results found for: BNP   ---------------------------------------------------------------------------------------------------------------  Urinalysis    Component Value Date/Time   COLORURINE YELLOW 11/02/2013 0816   APPEARANCEUR CLOUDY (A) 11/02/2013 0816   LABSPEC 1.025 11/02/2013 0816   PHURINE 6.5 11/02/2013 0816   GLUCOSEU NEGATIVE 11/02/2013 0816   HGBUR NEGATIVE 11/02/2013 0816   BILIRUBINUR NEGATIVE 11/02/2013 0816   KETONESUR NEGATIVE 11/02/2013 0816   PROTEINUR 100 (A) 11/02/2013 0816   UROBILINOGEN 0.2 11/02/2013 0816   NITRITE POSITIVE (A) 11/02/2013 0816   LEUKOCYTESUR NEGATIVE 11/02/2013 0816    ----------------------------------------------------------------------------------------------------------------   Imaging  Results:    US Venous Img Lower Bilateral (DVT)  Result Date: 08/04/2020 CLINICAL DATA:  Bilateral lower extremity edema EXAM: BILATERAL LOWER EXTREMITY VENOUS DOPPLER ULTRASOUND TECHNIQUE: Gray-scale sonography with graded compression, as well as color Doppler and duplex ultrasound were performed to evaluate the lower extremity deep venous systems from the level of the common femoral vein and including the common femoral, femoral, profunda femoral, popliteal and calf veins including the posterior tibial, peroneal and gastrocnemius veins when visible. The superficial great saphenous vein was also interrogated. Spectral Doppler was utilized to evaluate flow at rest and with distal augmentation maneuvers in the common femoral, femoral and popliteal veins. COMPARISON:  None. FINDINGS: RIGHT LOWER EXTREMITY Common Femoral Vein: No evidence of thrombus. Normal compressibility, respiratory phasicity and response to augmentation. Saphenofemoral Junction: No evidence of thrombus. Normal compressibility and flow on color Doppler imaging. Profunda Femoral Vein: No evidence of thrombus. Normal compressibility and flow on color Doppler imaging. Femoral Vein: No evidence of  thrombus. Normal compressibility, respiratory phasicity and response to augmentation. Popliteal Vein: No evidence of thrombus. Normal compressibility, respiratory phasicity and response to augmentation. Calf Veins: No evidence of thrombus. Normal compressibility and flow on color Doppler imaging. Superficial Great Saphenous Vein: No evidence of thrombus. Normal compressibility. Venous Reflux:  None. Other Findings: Mildly prominent right inguinal lymph nodes noted. LEFT LOWER EXTREMITY Common Femoral Vein: There is acute appearing deep venous thrombosis in the left common femoral vein. There is no appreciable flow in this vessel. No compression or augmentation evident. Saphenofemoral Junction: No evidence of thrombus. Normal compressibility and flow on color Doppler imaging. Profunda Femoral Vein: There is acute appearing deep venous thrombosis in the profunda femoral vein. No appreciable flow in this vessel. No compression or augmentation evident. Femoral Vein: There is acute appearing deep venous thrombosis throughout the left femoral vein without appreciable flow evident. No compression or augmentation evident. Popliteal Vein: Acute appearing deep venous thrombosis throughout the left popliteal vein without appreciable flow evident. No compression or augmentation evident. Calf Veins: Acute appearing deep venous thrombosis in the peroneal and posterior tibial veins with essentially absent flow in these vessels. Loss of compression augmentation in these vessels. Anterior tibial veins appear patent. Superficial Great Saphenous Vein: No evidence of thrombus. Normal compressibility. Venous Reflux:  None. Other Findings:  Mildly prominent left inguinal lymph nodes noted. IMPRESSION: 1. Extensive deep venous thrombosis throughout the left lower extremity venous structures. 2.  No deep venous thrombosis in the right lower extremity. 3.  Prominent inguinal lymph nodes bilaterally. These results will be called to the  ordering clinician or representative by the Radiology Department at the imaging location. Electronically Signed   By: Lowella Grip III M.D.   On: 08/04/2020 15:03    Radiological Exams on Admission: US Venous Img Lower Bilateral (DVT)  Result Date: 08/04/2020 CLINICAL DATA:  Bilateral lower extremity edema EXAM: BILATERAL LOWER EXTREMITY VENOUS DOPPLER ULTRASOUND TECHNIQUE: Gray-scale sonography with graded compression, as well as color Doppler and duplex ultrasound were performed to evaluate the lower extremity deep venous systems from the level of the common femoral vein and including the common femoral, femoral, profunda femoral, popliteal and calf veins including the posterior tibial, peroneal and gastrocnemius veins when visible. The superficial great saphenous vein was also interrogated. Spectral Doppler was utilized to evaluate flow at rest and with distal augmentation maneuvers in the common femoral, femoral and popliteal veins. COMPARISON:  None. FINDINGS: RIGHT LOWER EXTREMITY Common Femoral Vein: No evidence of thrombus. Normal compressibility, respiratory  phasicity and response to augmentation. Saphenofemoral Junction: No evidence of thrombus. Normal compressibility and flow on color Doppler imaging. Profunda Femoral Vein: No evidence of thrombus. Normal compressibility and flow on color Doppler imaging. Femoral Vein: No evidence of thrombus. Normal compressibility, respiratory phasicity and response to augmentation. Popliteal Vein: No evidence of thrombus. Normal compressibility, respiratory phasicity and response to augmentation. Calf Veins: No evidence of thrombus. Normal compressibility and flow on color Doppler imaging. Superficial Great Saphenous Vein: No evidence of thrombus. Normal compressibility. Venous Reflux:  None. Other Findings: Mildly prominent right inguinal lymph nodes noted. LEFT LOWER EXTREMITY Common Femoral Vein: There is acute appearing deep venous thrombosis in the left  common femoral vein. There is no appreciable flow in this vessel. No compression or augmentation evident. Saphenofemoral Junction: No evidence of thrombus. Normal compressibility and flow on color Doppler imaging. Profunda Femoral Vein: There is acute appearing deep venous thrombosis in the profunda femoral vein. No appreciable flow in this vessel. No compression or augmentation evident. Femoral Vein: There is acute appearing deep venous thrombosis throughout the left femoral vein without appreciable flow evident. No compression or augmentation evident. Popliteal Vein: Acute appearing deep venous thrombosis throughout the left popliteal vein without appreciable flow evident. No compression or augmentation evident. Calf Veins: Acute appearing deep venous thrombosis in the peroneal and posterior tibial veins with essentially absent flow in these vessels. Loss of compression augmentation in these vessels. Anterior tibial veins appear patent. Superficial Great Saphenous Vein: No evidence of thrombus. Normal compressibility. Venous Reflux:  None. Other Findings:  Mildly prominent left inguinal lymph nodes noted. IMPRESSION: 1. Extensive deep venous thrombosis throughout the left lower extremity venous structures. 2.  No deep venous thrombosis in the right lower extremity. 3.  Prominent inguinal lymph nodes bilaterally. These results will be called to the ordering clinician or representative by the Radiology Department at the imaging location. Electronically Signed   By: Lowella Grip III M.D.   On: 08/04/2020 15:03    DVT Prophylaxis -iv Heparin AM Labs Ordered, also please review Full Orders  Family Communication: Admission, patients condition and plan of care including tests being ordered have been discussed with the patient  who indicate understanding and agree with the plan   Code Status - Full Code  Likely DC to transfer to [facility] and for further vascular surgery eval  Condition   stable  Roxan Hockey M.D on 08/05/2020 at 4:48 PM Go to [email or web address] -  for contact info  [facility] - Office  [phone]

## 2020-08-05 NOTE — ED Triage Notes (Signed)
States he had an ultrasound yesterday and was advised to come in for treatment today. Pain in both legs and left is worse

## 2020-08-05 NOTE — Telephone Encounter (Signed)
PA pending via availity for MRCP. PA tracking # [id number]

## 2020-08-05 NOTE — Progress Notes (Incomplete)
Pt arrived from ED, VSS call light within reach, will

## 2020-08-05 NOTE — Progress Notes (Signed)
ANTICOAGULATION CONSULT NOTE - Initial Consult  Pharmacy Consult for heparin Indication: DVT  No Known Allergies  Patient Measurements: Height: 6' (182.9 cm) Weight: 80.7 kg (178 lb) IBW/kg (Calculated) : 77.6 Heparin Dosing Weight: 80.7 kg  Vital Signs: Temp: 98.4 F (36.9 C) (04/07 1100) Temp Source: Oral (04/07 1100) BP: 103/70 (04/07 1100) Pulse Rate: 104 (04/07 1100)  Labs: No results for input(s): HGB, HCT, PLT, APTT, LABPROT, INR, HEPARINUNFRC, HEPRLOWMOCWT, CREATININE, CKTOTAL, CKMB, TROPONINIHS in the last 72 hours.  Estimated Creatinine Clearance: 92.5 mL/min (by C-G formula based on SCr of 0.99 mg/dL).   Medical History: Past Medical History:  Diagnosis Date  . Asthma   . Dyspnea   . High cholesterol   . History of kidney stones   . Hypertension   . Kidney stones     Medications:  (Not in a hospital admission)   Assessment: Pharmacy consulted to dose heparin in patient with DVT.  Patient is not on anticoagulation prior to admission.  Goal of Therapy:  Heparin level 0.3-0.7 units/ml Monitor platelets by anticoagulation protocol: Yes   Plan:  Give 5000 units bolus x 1 Start heparin infusion at 1300 units/hr Check anti-Xa level in 6 hours and daily while on heparin Continue to monitor H&H and platelets  Ramond Craver 08/05/2020,11:56 AM

## 2020-08-05 NOTE — Consult Note (Signed)
Hospital Consult    Reason for Consult:  Left lower extremity dvt Referring Physician:  Dr. Denton Brick MRN #:  [id number]  History of Present Illness: This is a 55 y.o. male with history of hld and htn. He has new finding of etoh cirrhosis and presented to [facility] ED with 1 month of ble swelling and pain.  States that he was sent by his gastroenterologist.  He is unaware of any recent CT scans that he has had.  States that his limitation of walking is tightness in his left leg.  He also has swelling of his right lower extremity but left leg is much worse.  He denies any tissue loss or ulceration of his lower extremities.  Has never had any lower extremity vascular intervention.  Denies any personal or family history of DVT.  He is unaware of any diagnosis of cancer.  He is a current every day smoker. He is currently on heparin.  Past Medical History:  Diagnosis Date  . Asthma   . Dyspnea   . High cholesterol   . History of kidney stones   . Hypertension   . Kidney stones     Past Surgical History:  Procedure Laterality Date  . APPENDECTOMY    . BIOPSY  07/22/2020   Procedure: BIOPSY;  Surgeon: Daneil Dolin, MD;  Location: [facility];  Service: Endoscopy;;  . COLONOSCOPY WITH PROPOFOL N/A 04/18/2017   non-bleeding internal hemorrhoids, two 4-6 mm polyps in descending colon and cecum, pancolonic diverticulosis, single cecal AVM. Tubular adenomas, surveillance in 2023.   Marland Kitchen ESOPHAGOGASTRODUODENOSCOPY (EGD) WITH PROPOFOL N/A 07/22/2020   normal esophagus, small hiatal hernia, abnormal gastric mucosa, abnormal appearing ampula and periampullary mucosa. Mild chronic gastritis.  Marland Kitchen FRACTURE SURGERY     left arm  . POLYPECTOMY  04/18/2017   Procedure: POLYPECTOMY;  Surgeon: Daneil Dolin, MD;  Location: [facility];  Service: Endoscopy;;    No Known Allergies  Prior to Admission medications   Medication Sig Start Date End Date Taking? Authorizing Provider  albuterol (PROVENTIL HFA) 108  (90 Base) MCG/ACT inhaler INHALE 2 PUFFS BY MOUTH EVERY 6 HOURS AS NEEDED FOR COUGHING, WHEEZING, OR SHORTNESS OF BREATH Patient taking differently: Inhale 1-2 puffs into the lungs every 6 (six) hours as needed for wheezing or shortness of breath. 06/02/19  Yes Soyla Dryer, PA-C  amLODipine (NORVASC) 10 MG tablet Take 10 mg by mouth in the morning. 04/02/20  Yes [provider]  atorvastatin (LIPITOR) 20 MG tablet Take 1 tablet (20 mg total) by mouth daily. Patient taking differently: Take 20 mg by mouth in the morning. 06/02/19  Yes Soyla Dryer, PA-C  furosemide (LASIX) 20 MG tablet Take 1 tablet (20 mg total) by mouth daily. 07/27/20  Yes Annitta Needs, NP  lisinopril (ZESTRIL) 10 MG tablet Take 10 mg by mouth in the morning. 04/02/20  Yes [provider]  mometasone-formoterol (DULERA) 200-5 MCG/ACT AERO Inhale 2 puffs into the lungs 2 (two) times daily. Patient taking differently: Inhale 2 puffs into the lungs 2 (two) times daily as needed (wheezing/shortness of breath). 06/02/19  Yes Soyla Dryer, PA-C  pantoprazole (PROTONIX) 40 MG tablet Take 1 tablet (40 mg total) by mouth daily. Take 30 minutes 07/07/20  Yes Annitta Needs, NP  spironolactone (ALDACTONE) 25 MG tablet Take 25 mg by mouth 2 (two) times daily. 05/13/20  Yes [provider]  potassium chloride (KLOR-CON) 10 MEQ tablet Take 4 tablets (40 mEq total) by mouth 2 (  two) times daily for 3 days. 07/20/20 07/23/20  Annitta Needs, NP    Social History   Socioeconomic History  . Marital status: Single    Spouse name: Not on file  . Number of children: Not on file  . Years of education: Not on file  . Highest education level: Not on file  Occupational History  . Not on file  Tobacco Use  . Smoking status: Current Every Day Smoker    Packs/day: 0.50    Years: 33.00    Pack years: 16.50    Types: Cigarettes  . Smokeless tobacco: Never Used  Vaping Use  . Vaping Use: Never used  Substance and Sexual  Activity  . 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 Drug use: No  . Sexual activity: Yes    Birth control/protection: None  Other Topics Concern  . Not on file  Social History Narrative  . Not on file   Social Determinants of Health   Financial Resource Strain: Not on file  Food Insecurity: Not on file  Transportation Needs: Not on file  Physical Activity: Not on file  Stress: Not on file  Social Connections: Not on file  Intimate Partner Violence: Not on file     Family History  Problem Relation Age of Onset  . Cancer Father        throat  . Diabetes Sister   . Colon cancer Neg Hx     ROS:  Cardiovascular: []  chest pain/pressure []  palpitations []  SOB lying flat []  DOE [x]  pain in legs while walking []  pain in legs at rest []  pain in legs at night []  non-healing ulcers []  hx of DVT [x]  swelling in legs  Pulmonary: []  productive cough []  asthma/wheezing []  home O2  Neurologic: []  weakness in []  arms []  legs []  numbness in []  arms []  legs []  hx of CVA []  mini stroke [] difficulty speaking or slurred speech []  temporary loss of vision in one eye []  dizziness  Hematologic: []  hx of cancer []  bleeding problems []  problems with blood clotting easily  Endocrine:   []  diabetes []  thyroid disease  GI []  vomiting blood []  blood in stool  GU: []  CKD/renal failure []  HD--[]  M/W/F or []  T/T/S []  burning with urination []  blood in urine  Psychiatric: []  anxiety []  depression  Musculoskeletal: []  arthritis []  joint pain  Integumentary: []  rashes []  ulcers  Constitutional: []  fever []  chills   Physical Examination  Vitals:   08/05/20 1501 08/05/20 1616  BP: 107/75 120/80  Pulse: (!) 102 (!) 108  Resp: (!) 23 18  Temp:  98.4 F (36.9 C)  SpO2: 100% 100%   Body mass index is 24.14 kg/m.  General:  nad HENT: WNL, normocephalic Pulmonary: normal  non-labored breathing Cardiac: palpable femoral and radial pulses Abdomen: soft, mild distension, no fluid wave Extremities: left greater than right lower extremity swelling left is pitting Musculoskeletal: no muscle wasting or atrophy  Neurologic: A&O X 3; Appropriate Affect ; SENSATION: normal; MOTOR FUNCTION:  moving all extremities equally. Speech is fluent/normal   CBC    Component Value Date/Time   WBC 10.4 08/05/2020 1236   RBC 4.06 (L) 08/05/2020 1236   HGB 12.2 (L) 08/05/2020 1236   HCT 37.1 (L) 08/05/2020 1236   PLT 452 (H) 08/05/2020 1236   MCV 91.4 08/05/2020 1236   MCH  30.0 08/05/2020 1236   MCHC 32.9 08/05/2020 1236   RDW 14.6 08/05/2020 1236   LYMPHSABS 2.0 08/05/2020 1236   MONOABS 1.3 (H) 08/05/2020 1236   EOSABS 0.1 08/05/2020 1236   BASOSABS 0.1 08/05/2020 1236    BMET    Component Value Date/Time   NA 136 08/05/2020 1236   K 3.0 (L) 08/05/2020 1236   CL 105 08/05/2020 1236   CO2 22 08/05/2020 1236   GLUCOSE 89 08/05/2020 1236   BUN <5 (L) 08/05/2020 1236   CREATININE 1.06 08/05/2020 1236   CREATININE 0.99 07/26/2020 0947   CALCIUM 7.5 (L) 08/05/2020 1236   GFRNONAA >60 08/05/2020 1236   GFRNONAA 69 01/13/2016 0818   GFRAA >60 05/27/2019 1257   GFRAA 80 01/13/2016 0818    COAGS: Lab Results  Component Value Date   INR 1.2 08/05/2020   INR 1.3 (H) 07/20/2020   INR 1.08 01/06/2013     Non-Invasive Vascular Imaging:    Bilateral lower extremity venous duplex  RIGHT LOWER EXTREMITY  Common Femoral Vein: No evidence of thrombus. Normal compressibility, respiratory phasicity and response to augmentation.  Saphenofemoral Junction: No evidence of thrombus. Normal compressibility and flow on color Doppler imaging.  Profunda Femoral Vein: No evidence of thrombus. Normal compressibility and flow on color Doppler imaging.  Femoral Vein: No evidence of thrombus. Normal compressibility, respiratory phasicity and response to  augmentation.  Popliteal Vein: No evidence of thrombus. Normal compressibility, respiratory phasicity and response to augmentation.  Calf Veins: No evidence of thrombus. Normal compressibility and flow on color Doppler imaging.  Superficial Great Saphenous Vein: No evidence of thrombus. Normal compressibility.  Venous Reflux:  None.  Other Findings: Mildly prominent right inguinal lymph nodes noted.  LEFT LOWER EXTREMITY  Common Femoral Vein: There is acute appearing deep venous thrombosis in the left common femoral vein. There is no appreciable flow in this vessel. No compression or augmentation evident.  Saphenofemoral Junction: No evidence of thrombus. Normal compressibility and flow on color Doppler imaging.  Profunda Femoral Vein: There is acute appearing deep venous thrombosis in the profunda femoral vein. No appreciable flow in this vessel. No compression or augmentation evident.  Femoral Vein: There is acute appearing deep venous thrombosis throughout the left femoral vein without appreciable flow evident. No compression or augmentation evident.  Popliteal Vein: Acute appearing deep venous thrombosis throughout the left popliteal vein without appreciable flow evident. No compression or augmentation evident.  Calf Veins: Acute appearing deep venous thrombosis in the peroneal and posterior tibial veins with essentially absent flow in these vessels. Loss of compression augmentation in these vessels. Anterior tibial veins appear patent.  Superficial Great Saphenous Vein: No evidence of thrombus. Normal compressibility.  Venous Reflux:  None.  Other Findings:  Mildly prominent left inguinal lymph nodes noted.  IMPRESSION: 1. Extensive deep venous thrombosis throughout the left lower extremity venous structures.  2.  No deep venous thrombosis in the right lower extremity.  3.  Prominent inguinal lymph nodes bilaterally.   ASSESSMENT/PLAN:  This is a 55 y.o. male with 1 month history history of bilateral lower extremity swelling left greater than right.  This is symptoms limitation to walking with the left lower extremity swelling.  He is found to have extensive left lower extremity DVT.  He also has new diagnosis of cirrhosis.  Does not have a personal or family history of DVT.  Going to get a CT venogram given the bilateral nature of the swelling and also due to his risk of  intra-abdominal malignancy or possible mechanical obstruction.  He will be n.p.o. past midnight continue heparin drip.  I discussed with him possibly proceeding with mechanical thrombectomy tomorrow based on results of CT venogram today.  Patient demonstrates very good understanding.  Shonta Phillis C. Donzetta Matters, MD [facility] Office: [phone] Pager: [phone]

## 2020-08-06 ENCOUNTER — Observation Stay (HOSPITAL_COMMUNITY): Payer: Medicaid Other

## 2020-08-06 ENCOUNTER — Other Ambulatory Visit (HOSPITAL_COMMUNITY): Payer: Self-pay

## 2020-08-06 DIAGNOSIS — K7031 Alcoholic cirrhosis of liver with ascites: Secondary | ICD-10-CM | POA: Diagnosis present

## 2020-08-06 DIAGNOSIS — I82442 Acute embolism and thrombosis of left tibial vein: Secondary | ICD-10-CM | POA: Diagnosis present

## 2020-08-06 DIAGNOSIS — E876 Hypokalemia: Secondary | ICD-10-CM | POA: Diagnosis present

## 2020-08-06 DIAGNOSIS — E8809 Other disorders of plasma-protein metabolism, not elsewhere classified: Secondary | ICD-10-CM | POA: Diagnosis present

## 2020-08-06 DIAGNOSIS — Z86010 Personal history of colonic polyps: Secondary | ICD-10-CM | POA: Diagnosis not present

## 2020-08-06 DIAGNOSIS — I82412 Acute embolism and thrombosis of left femoral vein: Secondary | ICD-10-CM | POA: Diagnosis present

## 2020-08-06 DIAGNOSIS — F1020 Alcohol dependence, uncomplicated: Secondary | ICD-10-CM | POA: Diagnosis present

## 2020-08-06 DIAGNOSIS — I10 Essential (primary) hypertension: Secondary | ICD-10-CM | POA: Diagnosis present

## 2020-08-06 DIAGNOSIS — I82492 Acute embolism and thrombosis of other specified deep vein of left lower extremity: Secondary | ICD-10-CM | POA: Diagnosis not present

## 2020-08-06 DIAGNOSIS — K2920 Alcoholic gastritis without bleeding: Secondary | ICD-10-CM | POA: Diagnosis present

## 2020-08-06 DIAGNOSIS — I959 Hypotension, unspecified: Secondary | ICD-10-CM | POA: Diagnosis not present

## 2020-08-06 DIAGNOSIS — Z87442 Personal history of urinary calculi: Secondary | ICD-10-CM | POA: Diagnosis not present

## 2020-08-06 DIAGNOSIS — R0602 Shortness of breath: Secondary | ICD-10-CM

## 2020-08-06 DIAGNOSIS — I82452 Acute embolism and thrombosis of left peroneal vein: Secondary | ICD-10-CM | POA: Diagnosis present

## 2020-08-06 DIAGNOSIS — J449 Chronic obstructive pulmonary disease, unspecified: Secondary | ICD-10-CM | POA: Diagnosis present

## 2020-08-06 DIAGNOSIS — I824Z2 Acute embolism and thrombosis of unspecified deep veins of left distal lower extremity: Secondary | ICD-10-CM | POA: Diagnosis not present

## 2020-08-06 DIAGNOSIS — Z20822 Contact with and (suspected) exposure to covid-19: Secondary | ICD-10-CM | POA: Diagnosis present

## 2020-08-06 DIAGNOSIS — E785 Hyperlipidemia, unspecified: Secondary | ICD-10-CM | POA: Diagnosis present

## 2020-08-06 DIAGNOSIS — Z79899 Other long term (current) drug therapy: Secondary | ICD-10-CM | POA: Diagnosis not present

## 2020-08-06 DIAGNOSIS — D638 Anemia in other chronic diseases classified elsewhere: Secondary | ICD-10-CM | POA: Diagnosis present

## 2020-08-06 DIAGNOSIS — M79605 Pain in left leg: Secondary | ICD-10-CM | POA: Diagnosis present

## 2020-08-06 DIAGNOSIS — T502X5A Adverse effect of carbonic-anhydrase inhibitors, benzothiadiazides and other diuretics, initial encounter: Secondary | ICD-10-CM | POA: Diagnosis present

## 2020-08-06 DIAGNOSIS — I82402 Acute embolism and thrombosis of unspecified deep veins of left lower extremity: Secondary | ICD-10-CM | POA: Diagnosis not present

## 2020-08-06 DIAGNOSIS — F17219 Nicotine dependence, cigarettes, with unspecified nicotine-induced disorders: Secondary | ICD-10-CM | POA: Diagnosis present

## 2020-08-06 DIAGNOSIS — Z86718 Personal history of other venous thrombosis and embolism: Secondary | ICD-10-CM | POA: Diagnosis not present

## 2020-08-06 DIAGNOSIS — K2950 Unspecified chronic gastritis without bleeding: Secondary | ICD-10-CM | POA: Diagnosis present

## 2020-08-06 DIAGNOSIS — E7800 Pure hypercholesterolemia, unspecified: Secondary | ICD-10-CM | POA: Diagnosis present

## 2020-08-06 LAB — BASIC METABOLIC PANEL
Anion gap: 5 (ref 5–15)
BUN: <5 mg/dL — ABNORMAL LOW (ref 6–20)
CO2: 22 mmol/L (ref 22–32)
Calcium: 8.2 mg/dL — ABNORMAL LOW (ref 8.9–10.3)
Chloride: 110 mmol/L (ref 98–111)
Creatinine, Ser: 1 mg/dL (ref 0.61–1.24)
GFR, Estimated: >60 mL/min (ref >60)
Glucose, Bld: 89 mg/dL (ref 70–99)
Potassium: 3.8 mmol/L (ref 3.5–5.1)
Sodium: 137 mmol/L (ref 135–145)

## 2020-08-06 LAB — CBC
HCT: 33.3 % — ABNORMAL LOW (ref 39.0–52.0)
Hemoglobin: 11.2 g/dL — ABNORMAL LOW (ref 13.0–17.0)
MCH: 30.3 pg (ref 26.0–34.0)
MCHC: 33.6 g/dL (ref 30.0–36.0)
MCV: 90 fL (ref 80.0–100.0)
Platelets: 414 10*3/uL — ABNORMAL HIGH (ref 150–400)
RBC: 3.7 MIL/uL — ABNORMAL LOW (ref 4.22–5.81)
RDW: 14.5 % (ref 11.5–15.5)
WBC: 10.1 10*3/uL (ref 4.0–10.5)
nRBC: 0 % (ref 0.0–0.2)

## 2020-08-06 LAB — IRON AND TIBC
Iron: 117 ug/dL (ref 45–182)
Saturation Ratios: 98 % — ABNORMAL HIGH (ref 17.9–39.5)
TIBC: 119 ug/dL — ABNORMAL LOW (ref 250–450)
UIBC: 2 ug/dL

## 2020-08-06 LAB — ECHOCARDIOGRAM COMPLETE
AR max vel: 4.13 cm2
AV Area VTI: 3.49 cm2
AV Area mean vel: 3.93 cm2
AV Mean grad: 2 mmHg
AV Peak grad: 3.4 mmHg
Ao pk vel: 0.92 m/s
Area-P 1/2: 2.39 cm2
Height: 72 in
S' Lateral: 2.9 cm
Weight: 2848 oz

## 2020-08-06 LAB — HEPARIN LEVEL (UNFRACTIONATED)
Heparin Unfractionated: 0.69 IU/mL (ref 0.30–0.70)
Heparin Unfractionated: 0.92 IU/mL — ABNORMAL HIGH (ref 0.30–0.70)
Heparin Unfractionated: 0.58 IU/mL (ref 0.30–0.70)

## 2020-08-06 LAB — VITAMIN B12: Vitamin B-12: 362 pg/mL (ref 180–914)

## 2020-08-06 LAB — TRANSFERRIN: Transferrin: 85 mg/dL — ABNORMAL LOW (ref 180–329)

## 2020-08-06 LAB — FERRITIN: Ferritin: 857 ng/mL — ABNORMAL HIGH (ref 24–336)

## 2020-08-06 LAB — FOLATE: Folate: 7.3 ng/mL (ref >5.9)

## 2020-08-06 LAB — RETICULOCYTES
Immature Retic Fract: 9.3 % (ref 2.3–15.9)
RBC.: 3.74 MIL/uL — ABNORMAL LOW (ref 4.22–5.81)
Retic Count, Absolute: 43 10*3/uL (ref 19.0–186.0)
Retic Ct Pct: 1.2 % (ref 0.4–3.1)

## 2020-08-06 LAB — C DIFFICILE QUICK SCREEN W PCR REFLEX
C Diff antigen: NEGATIVE
C Diff interpretation: NOT DETECTED
C Diff toxin: NEGATIVE

## 2020-08-06 LAB — OCCULT BLOOD X 1 CARD TO LAB, STOOL: Fecal Occult Bld: POSITIVE — AB

## 2020-08-06 NOTE — Progress Notes (Signed)
  Echocardiogram 2D Echocardiogram has been performed.  [name] [name], [name] 08/06/2020, 3:09 PM

## 2020-08-06 NOTE — Progress Notes (Signed)
ANTICOAGULATION CONSULT NOTE  Pharmacy Consult for heparin Indication: DVT  No Known Allergies  Patient Measurements: Height: 6' (182.9 cm) Weight: 80.7 kg (178 lb) IBW/kg (Calculated) : 77.6 Heparin Dosing Weight: 80.7 kg  Vital Signs: Temp: 98 F (36.7 C) (04/08 0740) Temp Source: Oral (04/08 0740) BP: 102/60 (04/08 0740) Pulse Rate: 90 (04/08 0740)  Labs: Recent Labs    08/05/20 1236 08/05/20 1631 08/05/20 2012 08/06/20 0445 08/06/20 1032  HGB 12.2*  --   --  11.2*  --   HCT 37.1*  --   --  33.3*  --   PLT 452*  --   --  414*  --   LABPROT 14.3  --   --   --   --   INR 1.2  --   --   --   --   HEPARINUNFRC  --    < > 0.23* 0.69 0.92*  CREATININE 1.06  --   --  1.00  --    < > = values in this interval not displayed.    Estimated Creatinine Clearance: 91.6 mL/min (by C-G formula based on SCr of 1 mg/dL).   Medical History: Past Medical History:  Diagnosis Date  . Asthma   . Dyspnea   . High cholesterol   . History of kidney stones   . Hypertension   . Kidney stones     Assessment: Pharmacy consulted to dose heparin in patient with DVT. Patient is not on anticoagulation prior to admission.   CBC wnl. Follow up heparin level this morning at upper end of goal at 0.69. Then confirmatory level shows continued increase to 0.92 well above goal on 1500 units/hr. No bleeding issues noted.   Goal of Therapy:  Heparin level 0.3-0.7 units/ml Monitor platelets by anticoagulation protocol: Yes   Plan:  Decrease heparin back to 1300 units/hr Recheck heparin level this evening  Erin Hearing PharmD., BCPS Clinical Pharmacist 08/06/2020 11:55 AM

## 2020-08-06 NOTE — Progress Notes (Signed)
PROGRESS NOTE    [name] [name]  DEY:[id number] DOB: May 01, 1966 DOA: 08/05/2020 PCP: Rosita Fire, MD   Brief Narrative:  [name] [name]  is a 55 y.o. male with past medical history relevant for alcoholic liver disease (last EtOH intake was February), HTN,HLD, tobacco abuse/COPD who has had complaints of wt loss, loss of appetite, regurgitation as well as worsening edema especially of the lower extremities despite Lasix and Aldactone use -He was seen by GI provider on 08/04/2020, lower extremity Dopplers were ordered due to worsening left leg swelling and discomfort, -Venous Dopplers demonstrate extensive left lower extremity DVT -Patient denies chest pains, no increased shortness of breath, no pleuritic symptoms -EDP discussed case with Dr. Donzetta Matters from vascular surgery who recommended transfer to [facility] for further vascular surgery evaluation -I personally called and spoke with Dr. Donzetta Matters and notified him the patient has now arrived at [facility] No fever  Or chills  No Nausea, Vomiting or Diarrhea --Albulmin is 1.8, INR is 1.2, LFTs are not elevated -Hgb is 12.2 -EKG sinus rhythm  Assessment & Plan:   Principal Problem:   DVT (deep venous thrombosis) -Lt LL Extensive DVT Active Problems:   Essential hypertension, benign   Cigarette nicotine dependence with nicotine-induced disorder   Alcoholic cirrhosis of liver with ascites (HCC)   COPD (chronic obstructive pulmonary disease) (HCC)   Hypokalemia  Extensive left lower extremity DVT: Patient was transferred from [facility] to [facility] to be assessed by vascular surgery.  He has been seen by Dr. Vella Redhead.  Also underwent venogram.  Waiting for vascular surgery for their further recommendations about possible thrombectomy.  He continues to be on heparin in the meantime.  History of alcoholic liver cirrhosis and chronic alcoholic gastritis: Viral hepatitis negative.  Reportedly had recent EGD with normal esophagus but mild chronic gastritis.   Last alcoholic intake reportedly was in February 2022. Normal LFTs.  I do not see any indication of GI consultation in inpatient.  Hypokalemia: Resolved.  Diarrhea: Started yesterday.  C. difficile pending.  Continue contact isolation.  History of COPD with tobacco abuse: Not in acute exacerbation.  Continue bronchodilators and nicotine patch.  Cessation counseling provided.  Acute anemia: Patient's hemoglobin this time upon presentation is 12.2 from previously being normal.  However hemoglobin has remained stable.  Doubt GI bleed.  No work-up ordered.  Will order iron studies, folate, B12 as well as FOBT.  Monitor H&H daily.  Essential hypertension: Blood pressure within normal range despite of holding antihypertensives.  Continue to hold antihypertensives.  Hyperlipidemia: Continue statin.  DVT prophylaxis:    Code Status: Full Code  Family Communication: None present at bedside.  Plan of care discussed with patient in length and he verbalized understanding and agreed with it.  Status is: Observation  The patient will require care spanning > 2 midnights and should be moved to inpatient because: Inpatient level of care appropriate due to severity of illness  Dispo: The patient is from: Home              Anticipated d/c is to: Home              Patient currently is not medically stable to d/c.   Difficult to place patient No        Estimated body mass index is 24.14 kg/m as calculated from the following:   Height as of this encounter: 6' (1.829 m).   Weight as of this encounter: 80.7 kg.  Nutritional status:               Consultants:   Vascular surgery  Procedures:   None  Antimicrobials:  Anti-infectives (From admission, onward)   None         Subjective: Seen and examined.  The only complaint he has is diarrhea.  No pain.  Objective: Vitals:   08/05/20 2345 08/06/20 0347 08/06/20 0740 08/06/20 0816  BP: 100/69 110/75 102/60   Pulse:  97 85 90   Resp: 16 20 18    Temp: 98.2 F (36.8 C) 98.4 F (36.9 C) 98 F (36.7 C)   TempSrc: Oral Oral Oral   SpO2: 100% 96% 97% 100%  Weight:      Height:        Intake/Output Summary (Last 24 hours) at 08/06/2020 1142 Last data filed at 08/05/2020 2130 Gross per 24 hour  Intake 337.27 ml  Output --  Net 337.27 ml   Filed Weights   08/05/20 1058  Weight: 80.7 kg    Examination:  General exam: Appears calm and comfortable  Respiratory system: Clear to auscultation. Respiratory effort normal. Cardiovascular system: S1 & S2 heard, RRR. No JVD, murmurs, rubs, gallops or clicks.  Some edema in left lower extremity with calf tenderness. Gastrointestinal system: Abdomen is nondistended, soft and nontender. No organomegaly or masses felt. Normal bowel sounds heard. Central nervous system: Alert and oriented. No focal neurological deficits. Extremities: Symmetric 5 x 5 power. Skin: No rashes, lesions or ulcers Psychiatry: Judgement and insight appear normal. Mood & affect appropriate.    Data Reviewed: I have personally reviewed following labs and imaging studies  CBC: Recent Labs  Lab 08/05/20 1236 08/06/20 0445  WBC 10.4 10.1  NEUTROABS 6.9  --   HGB 12.2* 11.2*  HCT 37.1* 33.3*  MCV 91.4 90.0  PLT 452* 353*   Basic Metabolic Panel: Recent Labs  Lab 08/05/20 1236 08/05/20 1648 08/06/20 0445  NA 136  --  137  K 3.0*  --  3.8  CL 105  --  110  CO2 22  --  22  GLUCOSE 89  --  89  BUN <5*  --  <5*  CREATININE 1.06  --  1.00  CALCIUM 7.5*  --  8.2*  MG  --  1.5*  --    GFR: Estimated Creatinine Clearance: 91.6 mL/min (by C-G formula based on SCr of 1 mg/dL). Liver Function Tests: Recent Labs  Lab 08/05/20 1236  AST 31  ALT 12  ALKPHOS 95  BILITOT 0.6  PROT 6.7  ALBUMIN 1.8*   No results for input(s): LIPASE, AMYLASE in the last 168 hours. No results for input(s): AMMONIA in the last 168 hours. Coagulation Profile: Recent Labs  Lab 08/05/20 1236   INR 1.2   Cardiac Enzymes: No results for input(s): CKTOTAL, CKMB, CKMBINDEX, TROPONINI in the last 168 hours. BNP (last 3 results) No results for input(s): PROBNP in the last 8760 hours. HbA1C: No results for input(s): HGBA1C in the last 72 hours. CBG: No results for input(s): GLUCAP in the last 168 hours. Lipid Profile: No results for input(s): CHOL, HDL, LDLCALC, TRIG, CHOLHDL, LDLDIRECT in the last 72 hours. Thyroid Function Tests: No results for input(s): TSH, T4TOTAL, FREET4, T3FREE, THYROIDAB in the last 72 hours. Anemia Panel: No results for input(s): VITAMINB12, FOLATE, FERRITIN, TIBC, IRON, RETICCTPCT in the last 72 hours. Sepsis Labs: No results for input(s): PROCALCITON, LATICACIDVEN in the last 168 hours.  Recent Results (from the past 240  hour(s))  Resp Panel by RT-PCR (Flu A&B, Covid) Nasopharyngeal Swab     Status: None   Collection Time: 08/05/20 12:39 PM   Specimen: Nasopharyngeal Swab; Nasopharyngeal(NP) swabs in vial transport medium  Result Value Ref Range Status   SARS Coronavirus 2 by RT PCR NEGATIVE NEGATIVE Final    Comment: (NOTE) SARS-CoV-2 target nucleic acids are NOT DETECTED.  The SARS-CoV-2 RNA is generally detectable in upper respiratory specimens during the acute phase of infection. The lowest concentration of SARS-CoV-2 viral copies this assay can detect is 138 copies/mL. A negative result does not preclude SARS-Cov-2 infection and should not be used as the sole basis for treatment or other patient management decisions. A negative result may occur with  improper specimen collection/handling, submission of specimen other than nasopharyngeal swab, presence of viral mutation(s) within the areas targeted by this assay, and inadequate number of viral copies(<138 copies/mL). A negative result must be combined with clinical observations, patient history, and epidemiological information. The expected result is Negative.  Fact Sheet for Patients:   EntrepreneurPulse.com.au  Fact Sheet for Healthcare Providers:  IncredibleEmployment.be  This test is no t yet approved or cleared by the [place] FDA and  has been authorized for detection and/or diagnosis of SARS-CoV-2 by FDA under an Emergency Use Authorization (EUA). This EUA will remain  in effect (meaning this test can be used) for the duration of the COVID-19 declaration under Section 564(b)(1) of the Act, 21 U.S.C.section 360bbb-3(b)(1), unless the authorization is terminated  or revoked sooner.       Influenza A by PCR NEGATIVE NEGATIVE Final   Influenza B by PCR NEGATIVE NEGATIVE Final    Comment: (NOTE) The Xpert Xpress SARS-CoV-2/FLU/RSV plus assay is intended as an aid in the diagnosis of influenza from Nasopharyngeal swab specimens and should not be used as a sole basis for treatment. Nasal washings and aspirates are unacceptable for Xpert Xpress SARS-CoV-2/FLU/RSV testing.  Fact Sheet for Patients: EntrepreneurPulse.com.au  Fact Sheet for Healthcare Providers: IncredibleEmployment.be  This test is not yet approved or cleared by the [place] FDA and has been authorized for detection and/or diagnosis of SARS-CoV-2 by FDA under an Emergency Use Authorization (EUA). This EUA will remain in effect (meaning this test can be used) for the duration of the COVID-19 declaration under Section 564(b)(1) of the Act, 21 U.S.C. section 360bbb-3(b)(1), unless the authorization is terminated or revoked.  Performed at [facility], [address] [place], [place] [address]       Radiology Studies: US Venous Img Lower Bilateral (DVT)  Result Date: 08/04/2020 CLINICAL DATA:  Bilateral lower extremity edema EXAM: BILATERAL LOWER EXTREMITY VENOUS DOPPLER ULTRASOUND TECHNIQUE: Gray-scale sonography with graded compression, as well as color Doppler and duplex ultrasound were performed to evaluate the  lower extremity deep venous systems from the level of the common femoral vein and including the common femoral, femoral, profunda femoral, popliteal and calf veins including the posterior tibial, peroneal and gastrocnemius veins when visible. The superficial great saphenous vein was also interrogated. Spectral Doppler was utilized to evaluate flow at rest and with distal augmentation maneuvers in the common femoral, femoral and popliteal veins. COMPARISON:  None. FINDINGS: RIGHT LOWER EXTREMITY Common Femoral Vein: No evidence of thrombus. Normal compressibility, respiratory phasicity and response to augmentation. Saphenofemoral Junction: No evidence of thrombus. Normal compressibility and flow on color Doppler imaging. Profunda Femoral Vein: No evidence of thrombus. Normal compressibility and flow on color Doppler imaging. Femoral Vein: No evidence of thrombus. Normal compressibility, respiratory  phasicity and response to augmentation. Popliteal Vein: No evidence of thrombus. Normal compressibility, respiratory phasicity and response to augmentation. Calf Veins: No evidence of thrombus. Normal compressibility and flow on color Doppler imaging. Superficial Great Saphenous Vein: No evidence of thrombus. Normal compressibility. Venous Reflux:  None. Other Findings: Mildly prominent right inguinal lymph nodes noted. LEFT LOWER EXTREMITY Common Femoral Vein: There is acute appearing deep venous thrombosis in the left common femoral vein. There is no appreciable flow in this vessel. No compression or augmentation evident. Saphenofemoral Junction: No evidence of thrombus. Normal compressibility and flow on color Doppler imaging. Profunda Femoral Vein: There is acute appearing deep venous thrombosis in the profunda femoral vein. No appreciable flow in this vessel. No compression or augmentation evident. Femoral Vein: There is acute appearing deep venous thrombosis throughout the left femoral vein without appreciable flow  evident. No compression or augmentation evident. Popliteal Vein: Acute appearing deep venous thrombosis throughout the left popliteal vein without appreciable flow evident. No compression or augmentation evident. Calf Veins: Acute appearing deep venous thrombosis in the peroneal and posterior tibial veins with essentially absent flow in these vessels. Loss of compression augmentation in these vessels. Anterior tibial veins appear patent. Superficial Great Saphenous Vein: No evidence of thrombus. Normal compressibility. Venous Reflux:  None. Other Findings:  Mildly prominent left inguinal lymph nodes noted. IMPRESSION: 1. Extensive deep venous thrombosis throughout the left lower extremity venous structures. 2.  No deep venous thrombosis in the right lower extremity. 3.  Prominent inguinal lymph nodes bilaterally. These results will be called to the ordering clinician or representative by the Radiology Department at the imaging location. Electronically Signed   By: Lowella Grip III M.D.   On: 08/04/2020 15:03   CT VENOGRAM ABD/PEL  Result Date: 08/05/2020 CLINICAL DATA:  Left deep vein thrombosis with new symptoms. EXAM: CT VENOGRAM ABDOMEN AND PELVIS CT ABDOMEN AND PELVIS WITH CONTRAST TECHNIQUE: Multidetector CT imaging of the abdomen and pelvis was performed using the CT venogram protocol following bolus administration of intravenous contrast. Images are extended to the knees. CONTRAST:  152mL OMNIPAQUE IOHEXOL 350 MG/ML SOLN COMPARISON:  01/14/2013 FINDINGS: CT VENOGRAM: Inferior vena cava: Normal caliber and patent. Right pelvis and lower extremity: The right common iliac, external iliac, common femoral, deep femoral, superficial femoral, and popliteal veins are patent. Note that there are prominent deep femoral vein collaterals with venous varices extending to the popliteal vein. Visualized popliteal vein appears patent. Left pelvis and lower extremity: The left common iliac vein is patent. There is  expansile filling defect consistent with thrombus beginning at the level of the left external iliac vein origin and extending into the common femoral, superficial femoral, and deep femoral veins. Diameter of the distal superficial femoral vein is decreased but there are evidence of thrombosis distally with apparent patency of the popliteal vein. Other: Subcutaneous soft tissue edema demonstrated in both legs, greater on the left. No loculated collections are identified. Arterial vascular calcifications. CT ABDOMEN PELVIS: Lower chest: The lung bases are clear. Hepatobiliary: Gallbladder is contracted with thickened edematous wall. Etiology is nonspecific. No gallstones or bile duct dilatation are demonstrated. No focal liver lesions. Portal veins are patent. Pancreas: Unremarkable. No pancreatic ductal dilatation or surrounding inflammatory changes. Spleen: Normal in size without focal abnormality. Adrenals/Urinary Tract: No adrenal gland nodules. Diffuse parenchymal atrophy and scarring involving the left kidney. Nephrograms remain symmetrical. No hydronephrosis or hydroureter. Bladder wall is thickened possibly due to under distention or cystitis. Stomach/Bowel: Stomach is normal. Diffusely fluid-filled  small bowel and colon with wall thickening throughout. Mesenteric edema and stranding. Changes likely represent enterocolitis. Mesenteric vessels are patent. No evidence of obstruction. Appendix is not identified. Vascular/Lymphatic: Calcification of the aorta. No aortic aneurysm. 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 No significant lymphadenopathy. Scattered celiac axis, retroperitoneal, and mesenteric lymph nodes are not pathologically enlarged, likely reactive. Reproductive: Prostate gland is not enlarged. Other: Moderate free fluid in the abdomen and pelvis consistent with ascites. No free air. Musculoskeletal: Degenerative changes in the spine. Medullary sclerosis  demonstrated in the mid/distal right femoral shaft, likely bone infarct or enchondroma. IMPRESSION: 1. Deep venous thrombosis beginning at the level of the left external iliac vein origin and extending into the common femoral, superficial femoral, and deep femoral veins to the level of the popliteal vein. 2. No deep venous thrombosis demonstrated in the inferior vena cava, right pelvic or right lower extremity veins. 3. Prominent deep femoral vein collaterals on the right with venous varices extending to the popliteal vein. 4. Subcutaneous soft tissue edema in both legs, greater on the left. 5. Diffusely fluid-filled small bowel and colon with wall thickening and mesenteric edema likely representing enterocolitis. No evidence of obstruction. 6. Diffuse parenchymal atrophy and scarring involving the left kidney. Nephrograms remain symmetrical. 7. Bladder wall is thickened possibly due to under distention or cystitis. 8. Moderate free fluid in the abdomen and pelvis consistent with ascites. 9. 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 10. Medullary sclerosis in the mid/distal right femoral shaft, likely bone infarct or enchondroma. Aortic Atherosclerosis (ICD10-I70.0). Electronically Signed   By: Lucienne Capers M.D.   On: 08/05/2020 21:41    Scheduled Meds: . atorvastatin  20 mg Oral Daily  . furosemide  20 mg Oral Daily  . mometasone-formoterol  2 puff Inhalation BID  . pantoprazole  40 mg Oral Daily  . sodium chloride flush  3 mL Intravenous Q12H   Continuous Infusions: . sodium chloride    . heparin 1,500 Units/hr (08/05/20 2146)     LOS: 0 days   Time spent: 34 minutes   Darliss Cheney, MD [facility]  08/06/2020, 11:42 AM   To contact the attending provider between 7A-7P or the covering provider during after hours 7P-7A, please log into the web site [email or web address].

## 2020-08-06 NOTE — Progress Notes (Signed)
ANTICOAGULATION CONSULT NOTE  Pharmacy Consult for heparin Indication: DVT  No Known Allergies  Patient Measurements: Height: 6' (182.9 cm) Weight: 80.7 kg (178 lb) IBW/kg (Calculated) : 77.6 Heparin Dosing Weight: 80.7 kg  Vital Signs: Temp: 98.7 F (37.1 C) (04/08 1956) Temp Source: Oral (04/08 1956) BP: 102/74 (04/08 1956) Pulse Rate: 80 (04/08 1956)  Labs: Recent Labs    08/05/20 1236 08/05/20 1631 08/06/20 0445 08/06/20 1032 08/06/20 1930  HGB 12.2*  --  11.2*  --   --   HCT 37.1*  --  33.3*  --   --   PLT 452*  --  414*  --   --   LABPROT 14.3  --   --   --   --   INR 1.2  --   --   --   --   HEPARINUNFRC  --    < > 0.69 0.92* 0.58  CREATININE 1.06  --  1.00  --   --    < > = values in this interval not displayed.    Estimated Creatinine Clearance: 91.6 mL/min (by C-G formula based on SCr of 1 mg/dL).   Medical History: Past Medical History:  Diagnosis Date  . Asthma   . Dyspnea   . High cholesterol   . History of kidney stones   . Hypertension   . Kidney stones     Assessment: Pharmacy consulted to dose heparin in patient with DVT. Patient is not on anticoagulation prior to admission.   CBC wnl. Follow up heparin level this morning at upper end of goal at 0.69. Then confirmatory level shows continued increase to 0.92 well above goal on 1500 units/hr. No bleeding issues noted.   HL came back therapeutic tonight. Will cont same rate and check in AM.  Goal of Therapy:  Heparin level 0.3-0.7 units/ml Monitor platelets by anticoagulation protocol: Yes   Plan:  Cont heparin 1300 units/hr F/u HL in AM  Onnie Boer, PharmD, BCIDP, AAHIVP, CPP Infectious Disease Pharmacist 08/06/2020 8:25 PM

## 2020-08-06 NOTE — TOC Benefit Eligibility Note (Signed)
Patient Teacher, English as a foreign language completed.    The patient is currently admitted and upon discharge could be taking Xarelto 15 mg.  The current 30 day co-pay is, $3.00.   The patient is currently admitted and upon discharge could be taking Eliquis 5 mg.  The current 30 day co-pay is, $3.00.   The patient is insured through [employer] [place] Patient Advocate Specialist [place] Team Direct Number: [phone]  Fax: [phone]

## 2020-08-06 NOTE — Progress Notes (Signed)
   Reviewed the patient's CT scan.  He appears to be a suitable candidate for left lower extremity venous thrombectomy given his symptoms.  Unfortunately he is having ongoing diarrhea this morning which is uncontrollable and I do not think he will be able to tolerated procedure particularly with enteric precautions.  We will wait to have this figured out possibly proceed with procedure early next week.  I discussed this with the patient he demonstrates good understanding.  Continue heparin drip for now.  Chenoah Mcnally C. Donzetta Matters, MD [facility] Office: [phone] Pager: [phone]

## 2020-08-06 NOTE — Progress Notes (Signed)
Dr. Tonie Griffith was text page and made aware that patient has had multi. Liq stools today. See orders for C-Diff protocol . And Dr. Tonie Griffith was also made aware of C.T. Scan results.

## 2020-08-07 LAB — COMPREHENSIVE METABOLIC PANEL
ALT: 10 U/L (ref 0–44)
AST: 23 U/L (ref 15–41)
Albumin: 1.3 g/dL — ABNORMAL LOW (ref 3.5–5.0)
Alkaline Phosphatase: 73 U/L (ref 38–126)
Anion gap: 5 (ref 5–15)
BUN: <5 mg/dL — ABNORMAL LOW (ref 6–20)
CO2: 17 mmol/L — ABNORMAL LOW (ref 22–32)
Calcium: 8 mg/dL — ABNORMAL LOW (ref 8.9–10.3)
Chloride: 115 mmol/L — ABNORMAL HIGH (ref 98–111)
Creatinine, Ser: 1.02 mg/dL (ref 0.61–1.24)
GFR, Estimated: >60 mL/min (ref >60)
Glucose, Bld: 93 mg/dL (ref 70–99)
Potassium: 3.5 mmol/L (ref 3.5–5.1)
Sodium: 137 mmol/L (ref 135–145)
Total Bilirubin: 0.6 mg/dL (ref 0.3–1.2)
Total Protein: 5.2 g/dL — ABNORMAL LOW (ref 6.5–8.1)

## 2020-08-07 LAB — CBC WITH DIFFERENTIAL/PLATELET
Abs Immature Granulocytes: 0.02 10*3/uL (ref 0.00–0.07)
Basophils Absolute: 0.1 10*3/uL (ref 0.0–0.1)
Basophils Relative: 1 %
Eosinophils Absolute: 0.3 10*3/uL (ref 0.0–0.5)
Eosinophils Relative: 3 %
HCT: 29.2 % — ABNORMAL LOW (ref 39.0–52.0)
Hemoglobin: 9.6 g/dL — ABNORMAL LOW (ref 13.0–17.0)
Immature Granulocytes: 0 %
Lymphocytes Relative: 32 %
Lymphs Abs: 3.2 10*3/uL (ref 0.7–4.0)
MCH: 30.2 pg (ref 26.0–34.0)
MCHC: 32.9 g/dL (ref 30.0–36.0)
MCV: 91.8 fL (ref 80.0–100.0)
Monocytes Absolute: 1.3 10*3/uL — ABNORMAL HIGH (ref 0.1–1.0)
Monocytes Relative: 13 %
Neutro Abs: 5.1 10*3/uL (ref 1.7–7.7)
Neutrophils Relative %: 51 %
Platelets: 393 10*3/uL (ref 150–400)
RBC: 3.18 MIL/uL — ABNORMAL LOW (ref 4.22–5.81)
RDW: 14.3 % (ref 11.5–15.5)
WBC: 9.9 10*3/uL (ref 4.0–10.5)
nRBC: 0 % (ref 0.0–0.2)

## 2020-08-07 LAB — GLUCOSE, CAPILLARY: Glucose-Capillary: 113 mg/dL — ABNORMAL HIGH (ref 70–99)

## 2020-08-07 LAB — HEPARIN LEVEL (UNFRACTIONATED): Heparin Unfractionated: 0.53 IU/mL (ref 0.30–0.70)

## 2020-08-07 MED ORDER — NICOTINE 14 MG/24HR TD PT24
14.0000 mg | MEDICATED_PATCH | Freq: Every day | TRANSDERMAL | Status: DC
  Administered 2020-08-07 – 2020-08-11 (×5): 14 mg via TRANSDERMAL
  Filled 2020-08-07 (×5): qty 1

## 2020-08-07 NOTE — Progress Notes (Signed)
   VASCULAR SURGERY ASSESSMENT & PLAN:   EXTENSIVE LEFT LOWER EXTREMITY DVT: The patient is being considered for mechanical thrombectomy however he had significant problems with diarrhea so his procedure on Friday was canceled.  We will reconsider intervention early next week.  He continues intravenous heparin.  I have also written for leg elevation.  C. difficile was negative.  SUBJECTIVE:   No specific complaints.  PHYSICAL EXAM:   Vitals:   08/06/20 2035 08/06/20 2307 08/07/20 0434 08/07/20 0838  BP:  95/72 102/71 108/76  Pulse: (!) 109 82 87 80  Resp:  20 20 12   Temp:  98.5 F (36.9 C) 98.2 F (36.8 C) 98.4 F (36.9 C)  TempSrc:  Oral Oral Oral  SpO2: 98% 97% 98% 98%  Weight:      Height:       Moderate left lower extremity swelling.  LABS:   Lab Results  Component Value Date   WBC 9.9 08/07/2020   HGB 9.6 (L) 08/07/2020   HCT 29.2 (L) 08/07/2020   MCV 91.8 08/07/2020   PLT 393 08/07/2020   Lab Results  Component Value Date   CREATININE 1.02 08/07/2020   Lab Results  Component Value Date   INR 1.2 08/05/2020    PROBLEM LIST:    Principal Problem:   DVT (deep venous thrombosis) -Lt LL Extensive DVT Active Problems:   Essential hypertension, benign   Cigarette nicotine dependence with nicotine-induced disorder   Alcoholic cirrhosis of liver with ascites (HCC)   COPD (chronic obstructive pulmonary disease) (HCC)   Hypokalemia   CURRENT MEDS:   . atorvastatin  20 mg Oral Daily  . furosemide  20 mg Oral Daily  . mometasone-formoterol  2 puff Inhalation BID  . pantoprazole  40 mg Oral Daily  . sodium chloride flush  3 mL Intravenous Q12H    Deitra Mayo Office: [phone] 08/07/2020

## 2020-08-07 NOTE — Plan of Care (Signed)

## 2020-08-07 NOTE — Progress Notes (Signed)
ANTICOAGULATION CONSULT NOTE  Pharmacy Consult for heparin Indication: DVT  No Known Allergies  Patient Measurements: Height: 6' (182.9 cm) Weight: 80.7 kg (178 lb) IBW/kg (Calculated) : 77.6 Heparin Dosing Weight: 80.7 kg  Vital Signs: Temp: 98.4 F (36.9 C) (04/09 0838) Temp Source: Oral (04/09 0838) BP: 108/76 (04/09 0838) Pulse Rate: 80 (04/09 0838)  Labs: Recent Labs    08/05/20 1236 08/05/20 1631 08/06/20 0445 08/06/20 1032 08/06/20 1930 08/07/20 0102  HGB 12.2*  --  11.2*  --   --  9.6*  HCT 37.1*  --  33.3*  --   --  29.2*  PLT 452*  --  414*  --   --  393  LABPROT 14.3  --   --   --   --   --   INR 1.2  --   --   --   --   --   HEPARINUNFRC  --    < > 0.69 0.92* 0.58 0.53  CREATININE 1.06  --  1.00  --   --  1.02   < > = values in this interval not displayed.    Estimated Creatinine Clearance: 89.8 mL/min (by C-G formula based on SCr of 1.02 mg/dL).   Medical History: Past Medical History:  Diagnosis Date  . Asthma   . Dyspnea   . High cholesterol   . History of kidney stones   . Hypertension   . Kidney stones     Assessment: [name] [name] with LLE DVT - pt is a candidate for thrombectomy but needs to be delayed d/t tolerability + requiring enteric precautions. Pharmacy consulted to dose IV heparin. Of note patient is not on anticoagulation prior to admission.   HL this AM therapeutic at 0.53 on heparin 1,300 units/hr. No s/sx bleeding noted. CBC down slightly - Hgb 9.6, PLT 393.   Goal of Therapy:  Heparin level 0.3-0.7 units/ml Monitor platelets by anticoagulation protocol: Yes   Plan:  Continue IV heparin at 1,300 units/hr Monitor daily HL and CBC Follow plans for thrombectomy (early next week per [facility])   Mercy Riding, PharmD PGY1 Acute Care Pharmacy Resident Please refer to [facility] for unit-specific pharmacist

## 2020-08-07 NOTE — Progress Notes (Signed)
PROGRESS NOTE    [name] [name]  LPF:[id number] DOB: May 22, 1965 DOA: 08/05/2020 PCP: Rosita Fire, MD   Brief Narrative:  [name] [name]  is a 55 y.o. male with past medical history relevant for alcoholic liver disease (last EtOH intake was February), HTN,HLD, tobacco abuse/COPD who has had complaints of wt loss, loss of appetite, regurgitation as well as worsening edema especially of the lower extremities despite Lasix and Aldactone use  He was seen by GI provider on 08/04/2020, lower extremity Dopplers were ordered due to worsening left leg swelling and discomfort, -Venous Dopplers demonstrated extensive left lower extremity DVT -EDP discussed case with Dr. Donzetta Matters from vascular surgery who recommended transfer to [facility] for further vascular surgery evaluation.  CT venogram was done which showed extensive left lower extremity DVT beginning at the level of left external iliac vein origin and extending into the common femoral, superficial femoral and deep femoral veins to the level of popliteal vein.  Vascular surgery plans to do thrombectomy however due to patient's diarrhea that started Friday, procedure was canceled and now is scheduled for the procedure on Monday.  Assessment & Plan:   Principal Problem:   DVT (deep venous thrombosis) -Lt LL Extensive DVT Active Problems:   Essential hypertension, benign   Cigarette nicotine dependence with nicotine-induced disorder   Alcoholic cirrhosis of liver with ascites (HCC)   COPD (chronic obstructive pulmonary disease) (HCC)   Hypokalemia  Extensive left lower extremity DVT: Patient was transferred from [facility] to [facility] to be assessed by vascular surgery.  He has been seen by Dr. Vella Redhead.  Also underwent venogram.  Has extensive left lower extremity DVT.  Vascular surgery plans for thrombectomy but due to him having diarrhea, procedure was canceled on Friday and hopefully it will be done early next week.  History of alcoholic liver cirrhosis and  chronic alcoholic gastritis: Viral hepatitis negative.  Reportedly had recent EGD with normal esophagus but mild chronic gastritis.  Last alcoholic intake reportedly was in February 2022. Normal LFTs.  I do not see any indication of GI consultation in inpatient.  Hypokalemia: Resolved.  Diarrhea: Improved.  Reportedly, he did not have any bowel movement since about 1 PM yesterday and then until 7 AM this morning when he had 2 soft bowel movements.  C. difficile is negative.  I have ordered enteric pathogen panel.  Will discontinue contact isolation if that is negative as well.  History of COPD with tobacco abuse: Not in acute exacerbation.  Continue bronchodilators and nicotine patch.  Cessation counseling provided.  Acute anemia: Patient's hemoglobin this time upon presentation is 12.2 from previously being normal.  However hemoglobin has remained stable since admission.  Iron studies indicate anemia of chronic disease.  Unfortunately, his FOBT is positive recent diagnosis of gastritis.  Monitor H&H daily.  Essential hypertension: Blood pressure within normal range despite of holding antihypertensives.  Continue to hold antihypertensives.  Hyperlipidemia: Continue statin.  Tobacco abuse: Smokes 1 pack of cigarettes per day.  Requesting nicotine patch.  Will order 1.  DVT prophylaxis:    Code Status: Full Code  Family Communication: None present at bedside.  Plan of care discussed with patient in length and he verbalized understanding and agreed with it.  Status is: Observation  The patient will require care spanning > 2 midnights and should be moved to inpatient because: Inpatient level of care appropriate due to severity of illness  Dispo: The patient is from: Home  Anticipated d/c is to: Home              Patient currently is not medically stable to d/c.   Difficult to place patient No        Estimated body mass index is 24.14 kg/m as calculated from the  following:   Height as of this encounter: 6' (1.829 m).   Weight as of this encounter: 80.7 kg.      Nutritional status:               Consultants:   Vascular surgery  Procedures:   None  Antimicrobials:  Anti-infectives (From admission, onward)   None         Subjective: Patient seen and examined.  He states that his diarrhea has improved.  He is requesting nicotine patch.  No complaints.  Objective: Vitals:   08/06/20 2035 08/06/20 2307 08/07/20 0434 08/07/20 0838  BP:  95/72 102/71 108/76  Pulse: (!) 109 82 87 80  Resp:  20 20 12   Temp:  98.5 F (36.9 C) 98.2 F (36.8 C) 98.4 F (36.9 C)  TempSrc:  Oral Oral Oral  SpO2: 98% 97% 98% 98%  Weight:      Height:        Intake/Output Summary (Last 24 hours) at 08/07/2020 1004 Last data filed at 08/07/2020 0600 Gross per 24 hour  Intake 156 ml  Output --  Net 156 ml   Filed Weights   08/05/20 1058  Weight: 80.7 kg    Examination:  General exam: Appears calm and comfortable  Respiratory system: Clear to auscultation. Respiratory effort normal. Cardiovascular system: S1 & S2 heard, RRR. No JVD, murmurs, rubs, gallops or clicks. No pedal edema. Gastrointestinal system: Abdomen is nondistended, soft and nontender. No organomegaly or masses felt. Normal bowel sounds heard. Central nervous system: Alert and oriented. No focal neurological deficits. Extremities: Edematous left lower extremity with left calf tenderness. Skin: No rashes, lesions or ulcers.  Psychiatry: Judgement and insight appear normal. Mood & affect appropriate.   Data Reviewed: I have personally reviewed following labs and imaging studies  CBC: Recent Labs  Lab 08/05/20 1236 08/06/20 0445 08/07/20 0102  WBC 10.4 10.1 9.9  NEUTROABS 6.9  --  5.1  HGB 12.2* 11.2* 9.6*  HCT 37.1* 33.3* 29.2*  MCV 91.4 90.0 91.8  PLT 452* 414* 546   Basic Metabolic Panel: Recent Labs  Lab 08/05/20 1236 08/05/20 1648 08/06/20 0445  08/07/20 0102  NA 136  --  137 137  K 3.0*  --  3.8 3.5  CL 105  --  110 115*  CO2 22  --  22 17*  GLUCOSE 89  --  89 93  BUN <5*  --  <5* <5*  CREATININE 1.06  --  1.00 1.02  CALCIUM 7.5*  --  8.2* 8.0*  MG  --  1.5*  --   --    GFR: Estimated Creatinine Clearance: 89.8 mL/min (by C-G formula based on SCr of 1.02 mg/dL). Liver Function Tests: Recent Labs  Lab 08/05/20 1236 08/07/20 0102  AST 31 23  ALT 12 10  ALKPHOS 95 73  BILITOT 0.6 0.6  PROT 6.7 5.2*  ALBUMIN 1.8* 1.3*   No results for input(s): LIPASE, AMYLASE in the last 168 hours. No results for input(s): AMMONIA in the last 168 hours. Coagulation Profile: Recent Labs  Lab 08/05/20 1236  INR 1.2   Cardiac Enzymes: No results for input(s): CKTOTAL, CKMB, CKMBINDEX, TROPONINI in the last  168 hours. BNP (last 3 results) No results for input(s): PROBNP in the last 8760 hours. HbA1C: No results for input(s): HGBA1C in the last 72 hours. CBG: No results for input(s): GLUCAP in the last 168 hours. Lipid Profile: No results for input(s): CHOL, HDL, LDLCALC, TRIG, CHOLHDL, LDLDIRECT in the last 72 hours. Thyroid Function Tests: No results for input(s): TSH, T4TOTAL, FREET4, T3FREE, THYROIDAB in the last 72 hours. Anemia Panel: Recent Labs    08/06/20 1220  VITAMINB12 362  FOLATE 7.3  FERRITIN 857*  TIBC 119*  IRON 117  RETICCTPCT 1.2   Sepsis Labs: No results for input(s): PROCALCITON, LATICACIDVEN in the last 168 hours.  Recent Results (from the past 240 hour(s))  Resp Panel by RT-PCR (Flu A&B, Covid) Nasopharyngeal Swab     Status: None   Collection Time: 08/05/20 12:39 PM   Specimen: Nasopharyngeal Swab; Nasopharyngeal(NP) swabs in vial transport medium  Result Value Ref Range Status   SARS Coronavirus 2 by RT PCR NEGATIVE NEGATIVE Final    Comment: (NOTE) SARS-CoV-2 target nucleic acids are NOT DETECTED.  The SARS-CoV-2 RNA is generally detectable in upper respiratory specimens during the acute  phase of infection. The lowest concentration of SARS-CoV-2 viral copies this assay can detect is 138 copies/mL. A negative result does not preclude SARS-Cov-2 infection and should not be used as the sole basis for treatment or other patient management decisions. A negative result may occur with  improper specimen collection/handling, submission of specimen other than nasopharyngeal swab, presence of viral mutation(s) within the areas targeted by this assay, and inadequate number of viral copies(<138 copies/mL). A negative result must be combined with clinical observations, patient history, and epidemiological information. The expected result is Negative.  Fact Sheet for Patients:  EntrepreneurPulse.com.au  Fact Sheet for Healthcare Providers:  IncredibleEmployment.be  This test is no t yet approved or cleared by the [place] FDA and  has been authorized for detection and/or diagnosis of SARS-CoV-2 by FDA under an Emergency Use Authorization (EUA). This EUA will remain  in effect (meaning this test can be used) for the duration of the COVID-19 declaration under Section 564(b)(1) of the Act, 21 U.S.C.section 360bbb-3(b)(1), unless the authorization is terminated  or revoked sooner.       Influenza A by PCR NEGATIVE NEGATIVE Final   Influenza B by PCR NEGATIVE NEGATIVE Final    Comment: (NOTE) The Xpert Xpress SARS-CoV-2/FLU/RSV plus assay is intended as an aid in the diagnosis of influenza from Nasopharyngeal swab specimens and should not be used as a sole basis for treatment. Nasal washings and aspirates are unacceptable for Xpert Xpress SARS-CoV-2/FLU/RSV testing.  Fact Sheet for Patients: EntrepreneurPulse.com.au  Fact Sheet for Healthcare Providers: IncredibleEmployment.be  This test is not yet approved or cleared by the [place] FDA and has been authorized for detection and/or diagnosis of  SARS-CoV-2 by FDA under an Emergency Use Authorization (EUA). This EUA will remain in effect (meaning this test can be used) for the duration of the COVID-19 declaration under Section 564(b)(1) of the Act, 21 U.S.C. section 360bbb-3(b)(1), unless the authorization is terminated or revoked.  Performed at [facility], [address] [place], [place] [address]   C Difficile Quick Screen w PCR reflex     Status: None   Collection Time: 08/05/20 10:52 PM   Specimen: STOOL  Result Value Ref Range Status   C Diff antigen NEGATIVE NEGATIVE Final   C Diff toxin NEGATIVE NEGATIVE Final   C Diff interpretation No C.  difficile detected.  Final    Comment: Performed at [facility], [place] [address], [place], [place] [address]      Radiology Studies: ECHOCARDIOGRAM COMPLETE  Result Date: 08/06/2020    ECHOCARDIOGRAM REPORT   Patient Name:   [name] [name] Date of Exam: 08/06/2020 Medical Rec #:  [id number]    Height:       72.0 in Accession #:    [id number]   Weight:       178.0 lb Date of Birth:  1966-01-30     BSA:          2.028 m Patient Age:    36 years     BP:           102/60 mmHg Patient Gender: M            HR:           87 bpm. Exam Location:  Inpatient Procedure: 2D Echo, Cardiac Doppler and Color Doppler Indications:    R06.02 SOB  History:        Patient has no prior history of Echocardiogram examinations.                 Risk Factors:Hypertension, Dyslipidemia and Current Smoker.  Sonographer:    Merrie Roof Referring Phys: [place]  1. Left ventricular ejection fraction, by estimation, is 60 to 65%. The left ventricle has normal function. The left ventricle has no regional wall motion abnormalities. There is mild left ventricular hypertrophy. Left ventricular diastolic parameters were normal.  2. Right ventricular systolic function is normal. The right ventricular size is normal. Tricuspid regurgitation signal is inadequate for assessing PA pressure.  3. The mitral  valve is normal in structure. No evidence of mitral valve regurgitation.  4. The aortic valve was not well visualized. Aortic valve regurgitation is not visualized. Mild aortic valve sclerosis is present, with no evidence of aortic valve stenosis.  5. The inferior vena cava is normal in size with greater than 50% respiratory variability, suggesting right atrial pressure of 3 mmHg. FINDINGS  Left Ventricle: Left ventricular ejection fraction, by estimation, is 60 to 65%. The left ventricle has normal function. The left ventricle has no regional wall motion abnormalities. The left ventricular internal cavity size was normal in size. There is  mild left ventricular hypertrophy. Left ventricular diastolic parameters were normal. Right Ventricle: The right ventricular size is normal. No increase in right ventricular wall thickness. Right ventricular systolic function is normal. Tricuspid regurgitation signal is inadequate for assessing PA pressure. Left Atrium: Left atrial size was normal in size. Right Atrium: Right atrial size was not well visualized. Pericardium: There is no evidence of pericardial effusion. Mitral Valve: The mitral valve is normal in structure. No evidence of mitral valve regurgitation. Tricuspid Valve: The tricuspid valve is normal in structure. Tricuspid valve regurgitation is trivial. Aortic Valve: The aortic valve was not well visualized. Aortic valve regurgitation is not visualized. Mild aortic valve sclerosis is present, with no evidence of aortic valve stenosis. Aortic valve mean gradient measures 2.0 mmHg. Aortic valve peak gradient measures 3.4 mmHg. Aortic valve area, by VTI measures 3.49 cm. Pulmonic Valve: The pulmonic valve was not well visualized. Pulmonic valve regurgitation is not visualized. Aorta: The aortic root and ascending aorta are structurally normal, with no evidence of dilitation. Venous: The inferior vena cava is normal in size with greater than 50% respiratory  variability, suggesting right atrial pressure of 3 mmHg. IAS/Shunts: The interatrial  septum was not well visualized.  LEFT VENTRICLE PLAX 2D LVIDd:         4.30 cm  Diastology LVIDs:         2.90 cm  LV e' medial:    8.49 cm/s LV PW:         1.20 cm  LV E/e' medial:  7.7 LV IVS:        1.00 cm  LV e' lateral:   9.14 cm/s LVOT diam:     2.20 cm  LV E/e' lateral: 7.1 LV SV:         72 LV SV Index:   35 LVOT Area:     3.80 cm  RIGHT VENTRICLE TAPSE (M-mode): 1.4 cm LEFT ATRIUM           Index LA diam:      3.40 cm 1.68 cm/m LA Vol (A4C): 33.4 ml 16.47 ml/m  AORTIC VALVE AV Area (Vmax):    4.13 cm AV Area (Vmean):   3.93 cm AV Area (VTI):     3.49 cm AV Vmax:           91.60 cm/s AV Vmean:          58.100 cm/s AV VTI:            0.206 m AV Peak Grad:      3.4 mmHg AV Mean Grad:      2.0 mmHg LVOT Vmax:         99.40 cm/s LVOT Vmean:        60.000 cm/s LVOT VTI:          0.189 m LVOT/AV VTI ratio: 0.92  AORTA Ao Root diam: 3.00 cm Ao Asc diam:  3.20 cm MITRAL VALVE MV Area (PHT): 2.39 cm    SHUNTS MV Decel Time: 317 msec    Systemic VTI:  0.19 m MV E velocity: 65.20 cm/s  Systemic Diam: 2.20 cm MV A velocity: 60.20 cm/s MV E/A ratio:  1.08 Oswaldo Milian MD Electronically signed by Oswaldo Milian MD Signature Date/Time: 08/06/2020/6:54:31 PM    Final    CT VENOGRAM ABD/PEL  Result Date: 08/05/2020 CLINICAL DATA:  Left deep vein thrombosis with new symptoms. EXAM: CT VENOGRAM ABDOMEN AND PELVIS CT ABDOMEN AND PELVIS WITH CONTRAST TECHNIQUE: Multidetector CT imaging of the abdomen and pelvis was performed using the CT venogram protocol following bolus administration of intravenous contrast. Images are extended to the knees. CONTRAST:  112mL OMNIPAQUE IOHEXOL 350 MG/ML SOLN COMPARISON:  01/14/2013 FINDINGS: CT VENOGRAM: Inferior vena cava: Normal caliber and patent. Right pelvis and lower extremity: The right common iliac, external iliac, common femoral, deep femoral, superficial femoral, and popliteal  veins are patent. Note that there are prominent deep femoral vein collaterals with venous varices extending to the popliteal vein. Visualized popliteal vein appears patent. Left pelvis and lower extremity: The left common iliac vein is patent. There is expansile filling defect consistent with thrombus beginning at the level of the left external iliac vein origin and extending into the common femoral, superficial femoral, and deep femoral veins. Diameter of the distal superficial femoral vein is decreased but there are evidence of thrombosis distally with apparent patency of the popliteal vein. Other: Subcutaneous soft tissue edema demonstrated in both legs, greater on the left. No loculated collections are identified. Arterial vascular calcifications. CT ABDOMEN PELVIS: Lower chest: The lung bases are clear. Hepatobiliary: Gallbladder is contracted with thickened edematous wall. Etiology is nonspecific. No gallstones or bile duct dilatation are  demonstrated. No focal liver lesions. Portal veins are patent. Pancreas: Unremarkable. No pancreatic ductal dilatation or surrounding inflammatory changes. Spleen: Normal in size without focal abnormality. Adrenals/Urinary Tract: No adrenal gland nodules. Diffuse parenchymal atrophy and scarring involving the left kidney. Nephrograms remain symmetrical. No hydronephrosis or hydroureter. Bladder wall is thickened possibly due to under distention or cystitis. Stomach/Bowel: Stomach is normal. Diffusely fluid-filled small bowel and colon with wall thickening throughout. Mesenteric edema and stranding. Changes likely represent enterocolitis. Mesenteric vessels are patent. No evidence of obstruction. Appendix is not identified. Vascular/Lymphatic: Calcification of the aorta. No aortic aneurysm. 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 No significant lymphadenopathy. Scattered celiac axis, retroperitoneal, and mesenteric lymph nodes are not  pathologically enlarged, likely reactive. Reproductive: Prostate gland is not enlarged. Other: Moderate free fluid in the abdomen and pelvis consistent with ascites. No free air. Musculoskeletal: Degenerative changes in the spine. Medullary sclerosis demonstrated in the mid/distal right femoral shaft, likely bone infarct or enchondroma. IMPRESSION: 1. Deep venous thrombosis beginning at the level of the left external iliac vein origin and extending into the common femoral, superficial femoral, and deep femoral veins to the level of the popliteal vein. 2. No deep venous thrombosis demonstrated in the inferior vena cava, right pelvic or right lower extremity veins. 3. Prominent deep femoral vein collaterals on the right with venous varices extending to the popliteal vein. 4. Subcutaneous soft tissue edema in both legs, greater on the left. 5. Diffusely fluid-filled small bowel and colon with wall thickening and mesenteric edema likely representing enterocolitis. No evidence of obstruction. 6. Diffuse parenchymal atrophy and scarring involving the left kidney. Nephrograms remain symmetrical. 7. Bladder wall is thickened possibly due to under distention or cystitis. 8. Moderate free fluid in the abdomen and pelvis consistent with ascites. 9. 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 10. Medullary sclerosis in the mid/distal right femoral shaft, likely bone infarct or enchondroma. Aortic Atherosclerosis (ICD10-I70.0). Electronically Signed   By: Lucienne Capers M.D.   On: 08/05/2020 21:41    Scheduled Meds: . atorvastatin  20 mg Oral Daily  . furosemide  20 mg Oral Daily  . mometasone-formoterol  2 puff Inhalation BID  . pantoprazole  40 mg Oral Daily  . sodium chloride flush  3 mL Intravenous Q12H   Continuous Infusions: . sodium chloride    . heparin 1,300 Units/hr (08/06/20 1215)     LOS: 1 day   Time spent: 30 minutes   Darliss Cheney, MD [facility]  08/07/2020, 10:04 AM   To contact the attending provider between 7A-7P or the covering provider during after hours 7P-7A, please log into the web site [email or web address].

## 2020-08-08 LAB — CBC WITH DIFFERENTIAL/PLATELET
Abs Immature Granulocytes: 0.03 10*3/uL (ref 0.00–0.07)
Basophils Absolute: 0.1 10*3/uL (ref 0.0–0.1)
Basophils Relative: 1 %
Eosinophils Absolute: 0.3 10*3/uL (ref 0.0–0.5)
Eosinophils Relative: 3 %
HCT: 30.6 % — ABNORMAL LOW (ref 39.0–52.0)
Hemoglobin: 10.2 g/dL — ABNORMAL LOW (ref 13.0–17.0)
Immature Granulocytes: 0 %
Lymphocytes Relative: 33 %
Lymphs Abs: 3.3 10*3/uL (ref 0.7–4.0)
MCH: 30.1 pg (ref 26.0–34.0)
MCHC: 33.3 g/dL (ref 30.0–36.0)
MCV: 90.3 fL (ref 80.0–100.0)
Monocytes Absolute: 1.2 10*3/uL — ABNORMAL HIGH (ref 0.1–1.0)
Monocytes Relative: 12 %
Neutro Abs: 4.9 10*3/uL (ref 1.7–7.7)
Neutrophils Relative %: 51 %
Platelets: 442 10*3/uL — ABNORMAL HIGH (ref 150–400)
RBC: 3.39 MIL/uL — ABNORMAL LOW (ref 4.22–5.81)
RDW: 14.5 % (ref 11.5–15.5)
WBC: 9.7 10*3/uL (ref 4.0–10.5)
nRBC: 0 % (ref 0.0–0.2)

## 2020-08-08 LAB — GASTROINTESTINAL PANEL BY PCR, STOOL (REPLACES STOOL CULTURE)

## 2020-08-08 LAB — HEPARIN LEVEL (UNFRACTIONATED): Heparin Unfractionated: 0.67 IU/mL (ref 0.30–0.70)

## 2020-08-08 NOTE — Progress Notes (Addendum)
PROGRESS NOTE    [name] [name]  SWN:[id number] DOB: [birth year] DOA: 08/05/2020 PCP: Rosita Fire, MD    Brief Narrative:  This 55 years old male with PMH significant for alcoholic liver disease (last EtOH intake was in February) Hypertension, Hyperlipidemia, Tobacco abuse, COPD presented in the ED with C/O: weight loss, loss of appetite,  regurgitation as well as worsening edema in lower extremities despite being on Lasix and Aldactone use.  He was seen by GI on 4/6 , Lower extremity Doppler was ordered for worsening left leg swelling. Venous duplex demonstrated extensive lower extremity DVT.  Case was discussed with vascular surgeon Dr. Donzetta Matters who recommended transfer to [facility] for further vascular surgery evaluation.  CT venogram was done which shows extensive left lower extremity DVT beginning at the level of left external iliac vein and extending into the common femoral, superficial femoral and deep femoral veins to the level of popliteal vein.  Vascular surgery plans to for thrombectomy however due to patient's diarrhea procedure was canceled and now is scheduled on Monday.  Assessment & Plan:   Principal Problem:   DVT (deep venous thrombosis) -Lt LL Extensive DVT Active Problems:   Essential hypertension, benign   Cigarette nicotine dependence with nicotine-induced disorder   Alcoholic cirrhosis of liver with ascites (HCC)   COPD (chronic obstructive pulmonary disease) (HCC)   Hypokalemia   Extensive left lower extremity DVT:  Patient was found to have left leg swelling, venous duplex confirmed the DVT Patient was transferred from [facility] for vascular surgery evaluation.  He has been seen by Dr. Vella Redhead.  Patient underwent venogram,  found to have extensive left lower extremity DVT.  Vascular surgery planned for thrombectomy on friday but due to patient having diarrhea, procedure was canceled on Friday, rescheduled on Monday. Continue heparin gtt. monitor APTT per  pharmacy  History of alcoholic liver cirrhosis and chronic alcoholic gastritis:  Viral hepatitis negative. He reports recent EGD with normal esophagus but mild chronic gastritis.   Last alcoholic intake reportedly was in February 2022.  Normal LFTs.  There is no indication of GI consultation in inpatient.  Hypokalemia: Resolved.  Diarrhea: Resolved.  Patient developed loose watery stools.   C. difficile is negative, GI panel is negative.   Contact isolation is discontinued. Patient report diarrhea has resolved.  COPD : Not in acute exacerbation.   Continue bronchodilators and nicotine patch.   Cessation counseling provided.  Acute anemia: Patient's hemoglobin this time upon presentation is 12.2 from previously being normal.  However hemoglobin has remained stable since admission.  Iron studies indicate anemia of chronic disease.  Unfortunately, his FOBT is positive recent diagnosis of gastritis.  Monitor H&H daily.  Essential hypertension:  Blood pressure is well controlled off BP medications. Continue to hold antihypertensives.  Hyperlipidemia: Continue statin.  Tobacco abuse:  Smokes 1 pack of cigarettes per day.   Continue nicotine patch   DVT prophylaxis: Heparin Code Status:  Full code. Family Communication:  No family at bedside. Disposition Plan:  Status is: Inpatient  Remains inpatient appropriate because:Inpatient level of care appropriate due to severity of illness   Dispo: The patient is from: Home              Anticipated d/c is to: Home              Patient currently is not medically stable to d/c.   Difficult to place patient No   Consultants:   Vascular surgery  Procedures: Possible thrombectomy  on Monday Antimicrobials:   Anti-infectives (From admission, onward)   None     Subjective: Patient was seen and examined at bedside.  Overnight events noted.   Patient reports feeling better,  denies any concerns.  He reports diarrhea has  resolved. He is scheduled to have thrombectomy tomorrow morning,  he asked for nicotine patch.  Objective: Vitals:   08/08/20 0352 08/08/20 0723 08/08/20 0753 08/08/20 1246  BP: 98/70  115/77 108/80  Pulse: 92  88 79  Resp: 18  16 15   Temp: 98.5 F (36.9 C)  98.3 F (36.8 C) 98.2 F (36.8 C)  TempSrc: Oral  Oral Oral  SpO2: 100% 99% 100% 100%  Weight:      Height:        Intake/Output Summary (Last 24 hours) at 08/08/2020 1455 Last data filed at 08/08/2020 1100 Gross per 24 hour  Intake 240 ml  Output 380 ml  Net -140 ml   Filed Weights   08/05/20 1058  Weight: 80.7 kg    Examination:  General exam: Appears calm and comfortable, not in any acute distress. Respiratory system: Clear to auscultation. Respiratory effort normal. Cardiovascular system: S1 & S2 heard, RRR. No JVD, murmurs, rubs, gallops or clicks. No pedal edema. Gastrointestinal system: Abdomen is nondistended, soft and nontender. No organomegaly or masses felt.  Normal bowel sounds heard. Central nervous system: Alert and oriented. No focal neurological deficits. Extremities: Symmetric 5 x 5 power.  Left lower extremity swelling, tenderness noted. Skin: No rashes, lesions or ulcers Psychiatry: Judgement and insight appear normal. Mood & affect appropriate.     Data Reviewed: I have personally reviewed following labs and imaging studies  CBC: Recent Labs  Lab 08/05/20 1236 08/06/20 0445 08/07/20 0102 08/08/20 0040  WBC 10.4 10.1 9.9 9.7  NEUTROABS 6.9  --  5.1 4.9  HGB 12.2* 11.2* 9.6* 10.2*  HCT 37.1* 33.3* 29.2* 30.6*  MCV 91.4 90.0 91.8 90.3  PLT 452* 414* 393 353*   Basic Metabolic Panel: Recent Labs  Lab 08/05/20 1236 08/05/20 1648 08/06/20 0445 08/07/20 0102  NA 136  --  137 137  K 3.0*  --  3.8 3.5  CL 105  --  110 115*  CO2 22  --  22 17*  GLUCOSE 89  --  89 93  BUN <5*  --  <5* <5*  CREATININE 1.06  --  1.00 1.02  CALCIUM 7.5*  --  8.2* 8.0*  MG  --  1.5*  --   --     GFR: Estimated Creatinine Clearance: 89.8 mL/min (by C-G formula based on SCr of 1.02 mg/dL). Liver Function Tests: Recent Labs  Lab 08/05/20 1236 08/07/20 0102  AST 31 23  ALT 12 10  ALKPHOS 95 73  BILITOT 0.6 0.6  PROT 6.7 5.2*  ALBUMIN 1.8* 1.3*   No results for input(s): LIPASE, AMYLASE in the last 168 hours. No results for input(s): AMMONIA in the last 168 hours. Coagulation Profile: Recent Labs  Lab 08/05/20 1236  INR 1.2   Cardiac Enzymes: No results for input(s): CKTOTAL, CKMB, CKMBINDEX, TROPONINI in the last 168 hours. BNP (last 3 results) No results for input(s): PROBNP in the last 8760 hours. HbA1C: No results for input(s): HGBA1C in the last 72 hours. CBG: Recent Labs  Lab 08/07/20 1644  GLUCAP 113*   Lipid Profile: No results for input(s): CHOL, HDL, LDLCALC, TRIG, CHOLHDL, LDLDIRECT in the last 72 hours. Thyroid Function Tests: No results for input(s): TSH, T4TOTAL, FREET4,  T3FREE, THYROIDAB in the last 72 hours. Anemia Panel: Recent Labs    08/06/20 1220  VITAMINB12 362  FOLATE 7.3  FERRITIN 857*  TIBC 119*  IRON 117  RETICCTPCT 1.2   Sepsis Labs: No results for input(s): PROCALCITON, LATICACIDVEN in the last 168 hours.  Recent Results (from the past 240 hour(s))  Resp Panel by RT-PCR (Flu A&B, Covid) Nasopharyngeal Swab     Status: None   Collection Time: 08/05/20 12:39 PM   Specimen: Nasopharyngeal Swab; Nasopharyngeal(NP) swabs in vial transport medium  Result Value Ref Range Status   SARS Coronavirus 2 by RT PCR NEGATIVE NEGATIVE Final    Comment: (NOTE) SARS-CoV-2 target nucleic acids are NOT DETECTED.  The SARS-CoV-2 RNA is generally detectable in upper respiratory specimens during the acute phase of infection. The lowest concentration of SARS-CoV-2 viral copies this assay can detect is 138 copies/mL. A negative result does not preclude SARS-Cov-2 infection and should not be used as the sole basis for treatment or other  patient management decisions. A negative result may occur with  improper specimen collection/handling, submission of specimen other than nasopharyngeal swab, presence of viral mutation(s) within the areas targeted by this assay, and inadequate number of viral copies(<138 copies/mL). A negative result must be combined with clinical observations, patient history, and epidemiological information. The expected result is Negative.  Fact Sheet for Patients:  EntrepreneurPulse.com.au  Fact Sheet for Healthcare Providers:  IncredibleEmployment.be  This test is no t yet approved or cleared by the [place] FDA and  has been authorized for detection and/or diagnosis of SARS-CoV-2 by FDA under an Emergency Use Authorization (EUA). This EUA will remain  in effect (meaning this test can be used) for the duration of the COVID-19 declaration under Section 564(b)(1) of the Act, 21 U.S.C.section 360bbb-3(b)(1), unless the authorization is terminated  or revoked sooner.       Influenza A by PCR NEGATIVE NEGATIVE Final   Influenza B by PCR NEGATIVE NEGATIVE Final    Comment: (NOTE) The Xpert Xpress SARS-CoV-2/FLU/RSV plus assay is intended as an aid in the diagnosis of influenza from Nasopharyngeal swab specimens and should not be used as a sole basis for treatment. Nasal washings and aspirates are unacceptable for Xpert Xpress SARS-CoV-2/FLU/RSV testing.  Fact Sheet for Patients: EntrepreneurPulse.com.au  Fact Sheet for Healthcare Providers: IncredibleEmployment.be  This test is not yet approved or cleared by the [place] FDA and has been authorized for detection and/or diagnosis of SARS-CoV-2 by FDA under an Emergency Use Authorization (EUA). This EUA will remain in effect (meaning this test can be used) for the duration of the COVID-19 declaration under Section 564(b)(1) of the Act, 21 U.S.C. section  360bbb-3(b)(1), unless the authorization is terminated or revoked.  Performed at [facility], [address] [place], [place] [address]   C Difficile Quick Screen w PCR reflex     Status: None   Collection Time: 08/05/20 10:52 PM   Specimen: Stool  Result Value Ref Range Status   C Diff antigen NEGATIVE NEGATIVE Final   C Diff toxin NEGATIVE NEGATIVE Final   C Diff interpretation No C. difficile detected.  Final    Comment: Performed at [facility], [place] [address] [place], [place] [address]  Gastrointestinal Panel by PCR , Stool     Status: None   Collection Time: 08/07/20 11:35 AM   Specimen: Stool  Result Value Ref Range Status   Campylobacter species NOT DETECTED NOT DETECTED Final   Plesimonas shigelloides NOT  DETECTED NOT DETECTED Final   Salmonella species NOT DETECTED NOT DETECTED Final   Yersinia enterocolitica NOT DETECTED NOT DETECTED Final   Vibrio species NOT DETECTED NOT DETECTED Final   Vibrio cholerae NOT DETECTED NOT DETECTED Final   Enteroaggregative E coli (EAEC) NOT DETECTED NOT DETECTED Final   Enteropathogenic E coli (EPEC) NOT DETECTED NOT DETECTED Final   Enterotoxigenic E coli (ETEC) NOT DETECTED NOT DETECTED Final   Shiga like toxin producing E coli (STEC) NOT DETECTED NOT DETECTED Final   Shigella/Enteroinvasive E coli (EIEC) NOT DETECTED NOT DETECTED Final   Cryptosporidium NOT DETECTED NOT DETECTED Final   Cyclospora cayetanensis NOT DETECTED NOT DETECTED Final   Entamoeba histolytica NOT DETECTED NOT DETECTED Final   Giardia lamblia NOT DETECTED NOT DETECTED Final   Adenovirus F40/41 NOT DETECTED NOT DETECTED Final   Astrovirus NOT DETECTED NOT DETECTED Final   Norovirus GI/GII NOT DETECTED NOT DETECTED Final   Rotavirus A NOT DETECTED NOT DETECTED Final   Sapovirus (I, II, IV, and V) NOT DETECTED NOT DETECTED Final    Comment: Performed at [facility], [address], [place], [place] [address]    Radiology  Studies: ECHOCARDIOGRAM COMPLETE  Result Date: 08/06/2020    ECHOCARDIOGRAM REPORT   Patient Name:   [name] [name] Date of Exam: 08/06/2020 Medical Rec #:  [id number]    Height:       72.0 in Accession #:    [id number]   Weight:       178.0 lb Date of Birth:  [birth year]     BSA:          2.028 m Patient Age:    32 years     BP:           102/60 mmHg Patient Gender: M            HR:           87 bpm. Exam Location:  Inpatient Procedure: 2D Echo, Cardiac Doppler and Color Doppler Indications:    R06.02 SOB  History:        Patient has no prior history of Echocardiogram examinations.                 Risk Factors:Hypertension, Dyslipidemia and Current Smoker.  Sonographer:    Merrie Roof Referring Phys: [place]  1. Left ventricular ejection fraction, by estimation, is 60 to 65%. The left ventricle has normal function. The left ventricle has no regional wall motion abnormalities. There is mild left ventricular hypertrophy. Left ventricular diastolic parameters were normal.  2. Right ventricular systolic function is normal. The right ventricular size is normal. Tricuspid regurgitation signal is inadequate for assessing PA pressure.  3. The mitral valve is normal in structure. No evidence of mitral valve regurgitation.  4. The aortic valve was not well visualized. Aortic valve regurgitation is not visualized. Mild aortic valve sclerosis is present, with no evidence of aortic valve stenosis.  5. The inferior vena cava is normal in size with greater than 50% respiratory variability, suggesting right atrial pressure of 3 mmHg. FINDINGS  Left Ventricle: Left ventricular ejection fraction, by estimation, is 60 to 65%. The left ventricle has normal function. The left ventricle has no regional wall motion abnormalities. The left ventricular internal cavity size was normal in size. There is  mild left ventricular hypertrophy. Left ventricular diastolic parameters were normal. Right Ventricle: The right  ventricular size is normal. No increase in right ventricular wall thickness. Right  ventricular systolic function is normal. Tricuspid regurgitation signal is inadequate for assessing PA pressure. Left Atrium: Left atrial size was normal in size. Right Atrium: Right atrial size was not well visualized. Pericardium: There is no evidence of pericardial effusion. Mitral Valve: The mitral valve is normal in structure. No evidence of mitral valve regurgitation. Tricuspid Valve: The tricuspid valve is normal in structure. Tricuspid valve regurgitation is trivial. Aortic Valve: The aortic valve was not well visualized. Aortic valve regurgitation is not visualized. Mild aortic valve sclerosis is present, with no evidence of aortic valve stenosis. Aortic valve mean gradient measures 2.0 mmHg. Aortic valve peak gradient measures 3.4 mmHg. Aortic valve area, by VTI measures 3.49 cm. Pulmonic Valve: The pulmonic valve was not well visualized. Pulmonic valve regurgitation is not visualized. Aorta: The aortic root and ascending aorta are structurally normal, with no evidence of dilitation. Venous: The inferior vena cava is normal in size with greater than 50% respiratory variability, suggesting right atrial pressure of 3 mmHg. IAS/Shunts: The interatrial septum was not well visualized.  LEFT VENTRICLE PLAX 2D LVIDd:         4.30 cm  Diastology LVIDs:         2.90 cm  LV e' medial:    8.49 cm/s LV PW:         1.20 cm  LV E/e' medial:  7.7 LV IVS:        1.00 cm  LV e' lateral:   9.14 cm/s LVOT diam:     2.20 cm  LV E/e' lateral: 7.1 LV SV:         72 LV SV Index:   35 LVOT Area:     3.80 cm  RIGHT VENTRICLE TAPSE (M-mode): 1.4 cm LEFT ATRIUM           Index LA diam:      3.40 cm 1.68 cm/m LA Vol (A4C): 33.4 ml 16.47 ml/m  AORTIC VALVE AV Area (Vmax):    4.13 cm AV Area (Vmean):   3.93 cm AV Area (VTI):     3.49 cm AV Vmax:           91.60 cm/s AV Vmean:          58.100 cm/s AV VTI:            0.206 m AV Peak Grad:      3.4  mmHg AV Mean Grad:      2.0 mmHg LVOT Vmax:         99.40 cm/s LVOT Vmean:        60.000 cm/s LVOT VTI:          0.189 m LVOT/AV VTI ratio: 0.92  AORTA Ao Root diam: 3.00 cm Ao Asc diam:  3.20 cm MITRAL VALVE MV Area (PHT): 2.39 cm    SHUNTS MV Decel Time: 317 msec    Systemic VTI:  0.19 m MV E velocity: 65.20 cm/s  Systemic Diam: 2.20 cm MV A velocity: 60.20 cm/s MV E/A ratio:  1.08 Oswaldo Milian MD Electronically signed by Oswaldo Milian MD Signature Date/Time: 08/06/2020/6:54:31 PM    Final    Scheduled Meds: . atorvastatin  20 mg Oral Daily  . furosemide  20 mg Oral Daily  . mometasone-formoterol  2 puff Inhalation BID  . nicotine  14 mg Transdermal Daily  . pantoprazole  40 mg Oral Daily  . sodium chloride flush  3 mL Intravenous Q12H   Continuous Infusions: . sodium chloride    . heparin  1,300 Units/hr (08/08/20 0939)     LOS: 2 days    Time spent: 35 mins    Jaide Hillenburg, MD [facility]   If 7PM-7AM, please contact night-coverage

## 2020-08-08 NOTE — Progress Notes (Signed)
[place] for heparin Indication: DVT  No Known Allergies  Patient Measurements: Height: 6' (182.9 cm) Weight: 80.7 kg (178 lb) IBW/kg (Calculated) : 77.6 Heparin Dosing Weight: 80.7 kg  Vital Signs: Temp: 98.3 F (36.8 C) (04/10 0753) Temp Source: Oral (04/10 0753) BP: 115/77 (04/10 0753) Pulse Rate: 88 (04/10 0753)  Labs: Recent Labs    08/05/20 1236 08/05/20 1631 08/06/20 0445 08/06/20 1032 08/06/20 1930 08/07/20 0102 08/08/20 0040  HGB 12.2*  --  11.2*  --   --  9.6* 10.2*  HCT 37.1*  --  33.3*  --   --  29.2* 30.6*  PLT 452*  --  414*  --   --  393 442*  LABPROT 14.3  --   --   --   --   --   --   INR 1.2  --   --   --   --   --   --   HEPARINUNFRC  --    < > 0.69   < > 0.58 0.53 0.67  CREATININE 1.06  --  1.00  --   --  1.02  --    < > = values in this interval not displayed.    Estimated Creatinine Clearance: 89.8 mL/min (by C-G formula based on SCr of 1.02 mg/dL).   Medical History: Past Medical History:  Diagnosis Date  . Asthma   . Dyspnea   . High cholesterol   . History of kidney stones   . Hypertension   . Kidney stones     Assessment: 55yoM with LLE DVT - pt is a candidate for thrombectomy but needs to be delayed d/t tolerability + requiring enteric precautions. Pharmacy consulted to dose IV heparin. Of note patient is not on anticoagulation prior to admission.   HL this AM therapeutic at 0.67 on heparin 1,300 units/hr. No s/sx bleeding noted. Hgb 9-10, PLT 400s.  Goal of Therapy:  Heparin level 0.3-0.7 units/ml Monitor platelets by anticoagulation protocol: Yes   Plan:  Continue IV heparin at 1,300 units/hr Monitor daily HL and CBC Follow plans for thrombectomy (early next week per [facility])   Mercy Riding, PharmD PGY1 Acute Care Pharmacy Resident Please refer to [facility] for unit-specific pharmacist

## 2020-08-08 NOTE — Progress Notes (Signed)
Pt has wallet with ID, bank card, and 16 dollars in cash ( 5 dollar bills x2, 1 dollar bill x6 ) in pt's pant pocket. Pt refused for the nurse take the wallet to security locker or having his family member to take it home.   Lavenia Atlas, RN

## 2020-08-08 NOTE — Plan of Care (Signed)

## 2020-08-09 LAB — CBC WITH DIFFERENTIAL/PLATELET
Abs Immature Granulocytes: 0.04 10*3/uL (ref 0.00–0.07)
Basophils Absolute: 0.1 10*3/uL (ref 0.0–0.1)
Basophils Relative: 1 %
Eosinophils Absolute: 0.3 10*3/uL (ref 0.0–0.5)
Eosinophils Relative: 3 %
HCT: 30.6 % — ABNORMAL LOW (ref 39.0–52.0)
Hemoglobin: 10.3 g/dL — ABNORMAL LOW (ref 13.0–17.0)
Immature Granulocytes: 0 %
Lymphocytes Relative: 29 %
Lymphs Abs: 3 10*3/uL (ref 0.7–4.0)
MCH: 30.5 pg (ref 26.0–34.0)
MCHC: 33.7 g/dL (ref 30.0–36.0)
MCV: 90.5 fL (ref 80.0–100.0)
Monocytes Absolute: 1.2 10*3/uL — ABNORMAL HIGH (ref 0.1–1.0)
Monocytes Relative: 12 %
Neutro Abs: 5.7 10*3/uL (ref 1.7–7.7)
Neutrophils Relative %: 55 %
Platelets: 398 10*3/uL (ref 150–400)
RBC: 3.38 MIL/uL — ABNORMAL LOW (ref 4.22–5.81)
RDW: 14.5 % (ref 11.5–15.5)
WBC: 10.3 10*3/uL (ref 4.0–10.5)
nRBC: 0 % (ref 0.0–0.2)

## 2020-08-09 LAB — PHOSPHORUS: Phosphorus: 3.6 mg/dL (ref 2.5–4.6)

## 2020-08-09 LAB — BASIC METABOLIC PANEL
Anion gap: 5 (ref 5–15)
BUN: <5 mg/dL — ABNORMAL LOW (ref 6–20)
CO2: 22 mmol/L (ref 22–32)
Calcium: 7.5 mg/dL — ABNORMAL LOW (ref 8.9–10.3)
Chloride: 110 mmol/L (ref 98–111)
Creatinine, Ser: 1.07 mg/dL (ref 0.61–1.24)
GFR, Estimated: >60 mL/min (ref >60)
Glucose, Bld: 90 mg/dL (ref 70–99)
Potassium: 3.3 mmol/L — ABNORMAL LOW (ref 3.5–5.1)
Sodium: 137 mmol/L (ref 135–145)

## 2020-08-09 LAB — MAGNESIUM: Magnesium: 1.2 mg/dL — ABNORMAL LOW (ref 1.7–2.4)

## 2020-08-09 LAB — HEPARIN LEVEL (UNFRACTIONATED): Heparin Unfractionated: 0.58 IU/mL (ref 0.30–0.70)

## 2020-08-09 MED ORDER — MAGNESIUM SULFATE 4 GM/100ML IV SOLN
4.0000 g | Freq: Once | INTRAVENOUS | Status: AC
  Administered 2020-08-09: 4 g via INTRAVENOUS
  Filled 2020-08-09: qty 100

## 2020-08-09 MED ORDER — POTASSIUM CHLORIDE 20 MEQ PO PACK
40.0000 meq | PACK | Freq: Once | ORAL | Status: AC
  Administered 2020-08-09: 40 meq via ORAL
  Filled 2020-08-09: qty 2

## 2020-08-09 NOTE — Telephone Encounter (Signed)
PA approved. Auth# [id number] DOS 08/04/2020-10/02/2020  Patient currently admitted and will schedule MRI once discharged.

## 2020-08-09 NOTE — Progress Notes (Signed)
PROGRESS NOTE    [name] [name]  PFX:[id number] DOB: 1965-08-18 DOA: 08/05/2020 PCP: Rosita Fire, MD    Brief Narrative:  This 55 years old male with PMH significant for alcoholic liver disease (last EtOH intake was in February) Hypertension, Hyperlipidemia, Tobacco abuse, COPD presented in the ED with C/O: weight loss, loss of appetite,  regurgitation as well as worsening edema in lower extremities despite being on Lasix and Aldactone use.  He was seen by GI on 4/6 , Lower extremity Doppler was ordered for worsening left leg swelling. Venous duplex demonstrated extensive lower extremity DVT.  Case was discussed with vascular surgeon Dr. Donzetta Matters who recommended transfer to [facility] for further vascular surgery evaluation.  CT venogram was done which shows extensive left lower extremity DVT beginning at the level of left external iliac vein and extending into the common femoral, superficial femoral and deep femoral veins to the level of popliteal vein.  Vascular surgery plans to for thrombectomy however due to patient's diarrhea procedure was canceled and now is scheduled on tuesday.  Assessment & Plan:   Principal Problem:   DVT (deep venous thrombosis) -Lt LL Extensive DVT Active Problems:   Essential hypertension, benign   Cigarette nicotine dependence with nicotine-induced disorder   Alcoholic cirrhosis of liver with ascites (HCC)   COPD (chronic obstructive pulmonary disease) (HCC)   Hypokalemia   Extensive left lower extremity DVT:  Patient was found to have left leg swelling, venous duplex confirmed the DVT Patient was transferred from [facility] for vascular surgery evaluation.  He has been seen by Dr. Vella Redhead.  Patient underwent venogram,  found to have extensive left lower extremity DVT.  Vascular surgery planned thrombectomy on friday but due to patient having diarrhea, procedure was canceled on Friday, rescheduled on Tuesday. Continue heparin gtt. monitor APTT per  pharmacy  History of alcoholic liver cirrhosis and chronic alcoholic gastritis:  Viral hepatitis negative. He reports recent EGD with normal esophagus but mild chronic gastritis.   Last alcoholic intake reportedly was in February 2022.  Normal LFTs.  There is no indication of GI consultation  inpatient.  Hypokalemia: Resolved.  Diarrhea: Resolved.  Patient developed loose watery stools.   C. difficile is negative, GI panel is negative.   Contact isolation is discontinued. Patient report diarrhea has resolved.  COPD : Not in acute exacerbation.   Continue bronchodilators and nicotine patch.   Cessation counseling provided.  Acute anemia: Patient's hemoglobin this time upon presentation is 12.2 from previously being normal.  However hemoglobin has remained stable since admission.  Iron studies indicate anemia of chronic disease. unfortunately, his FOBT is positive recent diagnosis of gastritis.  Monitor H&H daily.  Essential hypertension:  Blood pressure is well controlled off BP medications. Continue to hold antihypertensives.  Hyperlipidemia: Continue statin.  Tobacco abuse:  Smokes 1 pack of cigarettes per day.   Continue nicotine patch   DVT prophylaxis: Heparin Code Status:  Full code. Family Communication:  No family at bedside. Disposition Plan:  Status is: Inpatient  Remains inpatient appropriate because:Inpatient level of care appropriate due to severity of illness   Dispo: The patient is from: Home              Anticipated d/c is to: Home              Patient currently is not medically stable to d/c.   Difficult to place patient No   Consultants:   Vascular surgery  Procedures: Possible thrombectomy on Monday  Antimicrobials:   Anti-infectives (From admission, onward)   None     Subjective: Patient was seen and examined at bedside.  Overnight events noted.   Patient reports feeling better,He reports diarrhea has resolved. He is scheduled to  have thrombectomy tomorrow morning,  he asked for nicotine patch. Patient reports left leg pain and swelling, not better with pain medications.  Objective: Vitals:   08/09/20 0440 08/09/20 0728 08/09/20 0848 08/09/20 1141  BP: 100/74 109/80  107/84  Pulse: 84 80  85  Resp: 15 16  13   Temp: 98.9 F (37.2 C) 98.6 F (37 C)  98.7 F (37.1 C)  TempSrc: Oral Oral  Oral  SpO2: 100% 100% 99% 99%  Weight:      Height:        Intake/Output Summary (Last 24 hours) at 08/09/2020 1501 Last data filed at 08/09/2020 1343 Gross per 24 hour  Intake 118 ml  Output 100 ml  Net 18 ml   Filed Weights   08/05/20 1058  Weight: 80.7 kg    Examination:  General exam: Appears calm and comfortable, not in any acute distress. Respiratory system: Clear to auscultation. Respiratory effort normal. Cardiovascular system: S1 & S2 heard, RRR. No JVD, murmurs, rubs, gallops or clicks. No pedal edema. Gastrointestinal system: Abdomen is nondistended, soft and nontender. No organomegaly or masses felt.  Normal bowel sounds heard. Central nervous system: Alert and oriented. No focal neurological deficits. Extremities: Symmetric 5 x 5 power.  Left lower extremity swelling, tenderness noted. Skin: No rashes, lesions or ulcers Psychiatry: Judgement and insight appear normal. Mood & affect appropriate.     Data Reviewed: I have personally reviewed following labs and imaging studies  CBC: Recent Labs  Lab 08/05/20 1236 08/06/20 0445 08/07/20 0102 08/08/20 0040 08/09/20 0626  WBC 10.4 10.1 9.9 9.7 10.3  NEUTROABS 6.9  --  5.1 4.9 5.7  HGB 12.2* 11.2* 9.6* 10.2* 10.3*  HCT 37.1* 33.3* 29.2* 30.6* 30.6*  MCV 91.4 90.0 91.8 90.3 90.5  PLT 452* 414* 393 442* 076   Basic Metabolic Panel: Recent Labs  Lab 08/05/20 1236 08/05/20 1648 08/06/20 0445 08/07/20 0102 08/09/20 0119  NA 136  --  137 137 137  K 3.0*  --  3.8 3.5 3.3*  CL 105  --  110 115* 110  CO2 22  --  22 17* 22  GLUCOSE 89  --  89  93 90  BUN <5*  --  <5* <5* <5*  CREATININE 1.06  --  1.00 1.02 1.07  CALCIUM 7.5*  --  8.2* 8.0* 7.5*  MG  --  1.5*  --   --  1.2*  PHOS  --   --   --   --  3.6   GFR: Estimated Creatinine Clearance: 85.6 mL/min (by C-G formula based on SCr of 1.07 mg/dL). Liver Function Tests: Recent Labs  Lab 08/05/20 1236 08/07/20 0102  AST 31 23  ALT 12 10  ALKPHOS 95 73  BILITOT 0.6 0.6  PROT 6.7 5.2*  ALBUMIN 1.8* 1.3*   No results for input(s): LIPASE, AMYLASE in the last 168 hours. No results for input(s): AMMONIA in the last 168 hours. Coagulation Profile: Recent Labs  Lab 08/05/20 1236  INR 1.2   Cardiac Enzymes: No results for input(s): CKTOTAL, CKMB, CKMBINDEX, TROPONINI in the last 168 hours. BNP (last 3 results) No results for input(s): PROBNP in the last 8760 hours. HbA1C: No results for input(s): HGBA1C in the last 72 hours. CBG:  Recent Labs  Lab 08/07/20 1644  GLUCAP 113*   Lipid Profile: No results for input(s): CHOL, HDL, LDLCALC, TRIG, CHOLHDL, LDLDIRECT in the last 72 hours. Thyroid Function Tests: No results for input(s): TSH, T4TOTAL, FREET4, T3FREE, THYROIDAB in the last 72 hours. Anemia Panel: No results for input(s): VITAMINB12, FOLATE, FERRITIN, TIBC, IRON, RETICCTPCT in the last 72 hours. Sepsis Labs: No results for input(s): PROCALCITON, LATICACIDVEN in the last 168 hours.  Recent Results (from the past 240 hour(s))  Resp Panel by RT-PCR (Flu A&B, Covid) Nasopharyngeal Swab     Status: None   Collection Time: 08/05/20 12:39 PM   Specimen: Nasopharyngeal Swab; Nasopharyngeal(NP) swabs in vial transport medium  Result Value Ref Range Status   SARS Coronavirus 2 by RT PCR NEGATIVE NEGATIVE Final    Comment: (NOTE) SARS-CoV-2 target nucleic acids are NOT DETECTED.  The SARS-CoV-2 RNA is generally detectable in upper respiratory specimens during the acute phase of infection. The lowest concentration of SARS-CoV-2 viral copies this assay can detect  is 138 copies/mL. A negative result does not preclude SARS-Cov-2 infection and should not be used as the sole basis for treatment or other patient management decisions. A negative result may occur with  improper specimen collection/handling, submission of specimen other than nasopharyngeal swab, presence of viral mutation(s) within the areas targeted by this assay, and inadequate number of viral copies(<138 copies/mL). A negative result must be combined with clinical observations, patient history, and epidemiological information. The expected result is Negative.  Fact Sheet for Patients:  EntrepreneurPulse.com.au  Fact Sheet for Healthcare Providers:  IncredibleEmployment.be  This test is no t yet approved or cleared by the [place] FDA and  has been authorized for detection and/or diagnosis of SARS-CoV-2 by FDA under an Emergency Use Authorization (EUA). This EUA will remain  in effect (meaning this test can be used) for the duration of the COVID-19 declaration under Section 564(b)(1) of the Act, 21 U.S.C.section 360bbb-3(b)(1), unless the authorization is terminated  or revoked sooner.       Influenza A by PCR NEGATIVE NEGATIVE Final   Influenza B by PCR NEGATIVE NEGATIVE Final    Comment: (NOTE) The Xpert Xpress SARS-CoV-2/FLU/RSV plus assay is intended as an aid in the diagnosis of influenza from Nasopharyngeal swab specimens and should not be used as a sole basis for treatment. Nasal washings and aspirates are unacceptable for Xpert Xpress SARS-CoV-2/FLU/RSV testing.  Fact Sheet for Patients: EntrepreneurPulse.com.au  Fact Sheet for Healthcare Providers: IncredibleEmployment.be  This test is not yet approved or cleared by the [place] FDA and has been authorized for detection and/or diagnosis of SARS-CoV-2 by FDA under an Emergency Use Authorization (EUA). This EUA will remain in effect  (meaning this test can be used) for the duration of the COVID-19 declaration under Section 564(b)(1) of the Act, 21 U.S.C. section 360bbb-3(b)(1), unless the authorization is terminated or revoked.  Performed at [facility], [address] [place], [place] [address]   C Difficile Quick Screen w PCR reflex     Status: None   Collection Time: 08/05/20 10:52 PM   Specimen: Stool  Result Value Ref Range Status   C Diff antigen NEGATIVE NEGATIVE Final   C Diff toxin NEGATIVE NEGATIVE Final   C Diff interpretation No C. difficile detected.  Final    Comment: Performed at [facility], [place] [address], [place], [place] [address]  Gastrointestinal Panel by PCR , Stool     Status: None   Collection Time: 08/07/20  11:35 AM   Specimen: Stool  Result Value Ref Range Status   Campylobacter species NOT DETECTED NOT DETECTED Final   Plesimonas shigelloides NOT DETECTED NOT DETECTED Final   Salmonella species NOT DETECTED NOT DETECTED Final   Yersinia enterocolitica NOT DETECTED NOT DETECTED Final   Vibrio species NOT DETECTED NOT DETECTED Final   Vibrio cholerae NOT DETECTED NOT DETECTED Final   Enteroaggregative E coli (EAEC) NOT DETECTED NOT DETECTED Final   Enteropathogenic E coli (EPEC) NOT DETECTED NOT DETECTED Final   Enterotoxigenic E coli (ETEC) NOT DETECTED NOT DETECTED Final   Shiga like toxin producing E coli (STEC) NOT DETECTED NOT DETECTED Final   Shigella/Enteroinvasive E coli (EIEC) NOT DETECTED NOT DETECTED Final   Cryptosporidium NOT DETECTED NOT DETECTED Final   Cyclospora cayetanensis NOT DETECTED NOT DETECTED Final   Entamoeba histolytica NOT DETECTED NOT DETECTED Final   Giardia lamblia NOT DETECTED NOT DETECTED Final   Adenovirus F40/41 NOT DETECTED NOT DETECTED Final   Astrovirus NOT DETECTED NOT DETECTED Final   Norovirus GI/GII NOT DETECTED NOT DETECTED Final   Rotavirus A NOT DETECTED NOT DETECTED Final   Sapovirus (I, II, IV, and V) NOT DETECTED NOT DETECTED  Final    Comment: Performed at [facility], [address] [place],  [address]    Radiology Studies: No results found. Scheduled Meds: . atorvastatin  20 mg Oral Daily  . furosemide  20 mg Oral Daily  . mometasone-formoterol  2 puff Inhalation BID  . nicotine  14 mg Transdermal Daily  . pantoprazole  40 mg Oral Daily  . sodium chloride flush  3 mL Intravenous Q12H   Continuous Infusions: . sodium chloride    . heparin 1,300 Units/hr (08/09/20 0629)     LOS: 3 days    Time spent: 25 mins    Taunya Goral, MD [facility]   If 7PM-7AM, please contact night-coverage

## 2020-08-09 NOTE — Progress Notes (Signed)
[place] for heparin Indication: DVT  No Known Allergies  Patient Measurements: Height: 6' (182.9 cm) Weight: 80.7 kg (178 lb) IBW/kg (Calculated) : 77.6 Heparin Dosing Weight: 80.7 kg  Vital Signs: Temp: 98.6 F (37 C) (04/11 0728) Temp Source: Oral (04/11 0728) BP: 109/80 (04/11 0728) Pulse Rate: 80 (04/11 0728)  Labs: Recent Labs    08/07/20 0102 08/08/20 0040 08/09/20 0119 08/09/20 0626  HGB 9.6* 10.2*  --  10.3*  HCT 29.2* 30.6*  --  30.6*  PLT 393 442*  --  398  HEPARINUNFRC 0.53 0.67 0.58  --   CREATININE 1.02  --  1.07  --     Estimated Creatinine Clearance: 85.6 mL/min (by C-G formula based on SCr of 1.07 mg/dL).   Medical History: Past Medical History:  Diagnosis Date  . Asthma   . Dyspnea   . High cholesterol   . History of kidney stones   . Hypertension   . Kidney stones     Assessment: 55yoM with LLE DVT - pt is a candidate for thrombectomy but needs to be delayed d/t significant problems with diarrhea. Pharmacy consulted to dose IV heparin. Of note patient is not on anticoagulation prior to admission.   HL this AM therapeutic at 0.58 on heparin 1,300 units/hr. No s/sx bleeding noted. Hgb 9-10, PLT wnl.  Goal of Therapy:  Heparin level 0.3-0.7 units/ml Monitor platelets by anticoagulation protocol: Yes   Plan:  Continue IV heparin at 1,300 units/hr Monitor daily HL and CBC Follow plans post thrombectomy (plan for today?)    Thank you for allowing [place] to participate in this patients care. Jens Som, PharmD 08/09/2020 8:17 AM  Please check [email or web address] for unit-specific pharmacy phone numbers.

## 2020-08-09 NOTE — Progress Notes (Signed)
  Progress Note    08/09/2020 1:31 PM * No surgery found *  Subjective: Diarrhea resolved, still has swelling left lower extremity greater than right  Vitals:   08/09/20 0848 08/09/20 1141  BP:  107/84  Pulse:  85  Resp:  13  Temp:  98.7 F (37.1 C)  SpO2: 99% 99%    Physical Exam: Awake alert oriented Nonlabored respirations Left leg with nonpitting edema throughout  CBC    Component Value Date/Time   WBC 10.3 08/09/2020 0626   RBC 3.38 (L) 08/09/2020 0626   HGB 10.3 (L) 08/09/2020 0626   HCT 30.6 (L) 08/09/2020 0626   PLT 398 08/09/2020 0626   MCV 90.5 08/09/2020 0626   MCH 30.5 08/09/2020 0626   MCHC 33.7 08/09/2020 0626   RDW 14.5 08/09/2020 0626   LYMPHSABS 3.0 08/09/2020 0626   MONOABS 1.2 (H) 08/09/2020 0626   EOSABS 0.3 08/09/2020 0626   BASOSABS 0.1 08/09/2020 0626    BMET    Component Value Date/Time   NA 137 08/09/2020 0119   K 3.3 (L) 08/09/2020 0119   CL 110 08/09/2020 0119   CO2 22 08/09/2020 0119   GLUCOSE 90 08/09/2020 0119   BUN <5 (L) 08/09/2020 0119   CREATININE 1.07 08/09/2020 0119   CREATININE 0.99 07/26/2020 0947   CALCIUM 7.5 (L) 08/09/2020 0119   GFRNONAA >60 08/09/2020 0119   GFRNONAA 69 01/13/2016 0818   GFRAA >60 05/27/2019 1257   GFRAA 80 01/13/2016 0818    INR    Component Value Date/Time   INR 1.2 08/05/2020 1236     Intake/Output Summary (Last 24 hours) at 08/09/2020 1331 Last data filed at 08/08/2020 1602 Gross per 24 hour  Intake --  Output 100 ml  Net -100 ml     Assessment:  55 y.o. male is here with extensive left lower extremity DVT  Plan: We will plan for left lower extremity venography with intravascular ultrasound and possible thrombectomy tomorrow.     N.p.o. past midnight.   Aloma Boch C. Donzetta Matters, MD [facility] Office: [phone] Pager: [phone]  08/09/2020 1:31 PM

## 2020-08-10 ENCOUNTER — Encounter (HOSPITAL_COMMUNITY): Payer: Self-pay | Admitting: Vascular Surgery

## 2020-08-10 ENCOUNTER — Encounter (HOSPITAL_COMMUNITY)
Admission: EM | Disposition: A | Discharge status: Home / Self Care | Payer: Self-pay | Source: Home / Self Care | Attending: Family Medicine

## 2020-08-10 ENCOUNTER — Ambulatory Visit (HOSPITAL_COMMUNITY): Admission: RE | Admit: 2020-08-10 | Payer: Medicaid Other | Source: Home / Self Care | Admitting: Vascular Surgery

## 2020-08-10 HISTORY — PX: LOWER EXTREMITY VENOGRAPHY: CATH118253

## 2020-08-10 HISTORY — PX: PERIPHERAL VASCULAR THROMBECTOMY: CATH118306

## 2020-08-10 HISTORY — PX: PERIPHERAL VASCULAR BALLOON ANGIOPLASTY: CATH118281

## 2020-08-10 LAB — HEPARIN LEVEL (UNFRACTIONATED): Heparin Unfractionated: 0.69 IU/mL (ref 0.30–0.70)

## 2020-08-10 LAB — CBC WITH DIFFERENTIAL/PLATELET
Abs Immature Granulocytes: 0.04 10*3/uL (ref 0.00–0.07)
Basophils Absolute: 0.1 10*3/uL (ref 0.0–0.1)
Basophils Relative: 1 %
Eosinophils Absolute: 0.3 10*3/uL (ref 0.0–0.5)
Eosinophils Relative: 3 %
HCT: 31.7 % — ABNORMAL LOW (ref 39.0–52.0)
Hemoglobin: 10.6 g/dL — ABNORMAL LOW (ref 13.0–17.0)
Immature Granulocytes: 0 %
Lymphocytes Relative: 33 %
Lymphs Abs: 3.6 10*3/uL (ref 0.7–4.0)
MCH: 30.5 pg (ref 26.0–34.0)
MCHC: 33.4 g/dL (ref 30.0–36.0)
MCV: 91.1 fL (ref 80.0–100.0)
Monocytes Absolute: 1.3 10*3/uL — ABNORMAL HIGH (ref 0.1–1.0)
Monocytes Relative: 12 %
Neutro Abs: 5.6 10*3/uL (ref 1.7–7.7)
Neutrophils Relative %: 51 %
Platelets: 406 10*3/uL — ABNORMAL HIGH (ref 150–400)
RBC: 3.48 MIL/uL — ABNORMAL LOW (ref 4.22–5.81)
RDW: 14.4 % (ref 11.5–15.5)
WBC: 11 10*3/uL — ABNORMAL HIGH (ref 4.0–10.5)
nRBC: 0 % (ref 0.0–0.2)

## 2020-08-10 LAB — BASIC METABOLIC PANEL
Anion gap: 5 (ref 5–15)
BUN: <5 mg/dL — ABNORMAL LOW (ref 6–20)
CO2: 23 mmol/L (ref 22–32)
Calcium: 7.7 mg/dL — ABNORMAL LOW (ref 8.9–10.3)
Chloride: 107 mmol/L (ref 98–111)
Creatinine, Ser: 0.99 mg/dL (ref 0.61–1.24)
GFR, Estimated: >60 mL/min (ref >60)
Glucose, Bld: 79 mg/dL (ref 70–99)
Potassium: 3.2 mmol/L — ABNORMAL LOW (ref 3.5–5.1)
Sodium: 135 mmol/L (ref 135–145)

## 2020-08-10 LAB — SURGICAL PCR SCREEN
MRSA, PCR: NEGATIVE
Staphylococcus aureus: NEGATIVE

## 2020-08-10 LAB — PHOSPHORUS: Phosphorus: 3.6 mg/dL (ref 2.5–4.6)

## 2020-08-10 LAB — MAGNESIUM: Magnesium: 2 mg/dL (ref 1.7–2.4)

## 2020-08-10 SURGERY — LOWER EXTREMITY VENOGRAPHY
Anesthesia: LOCAL

## 2020-08-10 MED ORDER — IODIXANOL 320 MG/ML IV SOLN
INTRAVENOUS | Status: DC | PRN
  Administered 2020-08-10: 30 mL via INTRAVENOUS

## 2020-08-10 MED ORDER — FENTANYL CITRATE (PF) 100 MCG/2ML IJ SOLN
INTRAMUSCULAR | Status: DC | PRN
  Administered 2020-08-10 (×2): 50 ug via INTRAVENOUS

## 2020-08-10 MED ORDER — SODIUM CHLORIDE 0.9 % WEIGHT BASED INFUSION
1.0000 mL/kg/h | INTRAVENOUS | Status: AC
  Administered 2020-08-10 (×2): 1 mL/kg/h via INTRAVENOUS

## 2020-08-10 MED ORDER — LIDOCAINE HCL (PF) 1 % IJ SOLN
INTRAMUSCULAR | Status: DC | PRN
  Administered 2020-08-10: 10 mL via INTRADERMAL

## 2020-08-10 MED ORDER — LIDOCAINE HCL (PF) 1 % IJ SOLN
INTRAMUSCULAR | Status: AC
  Filled 2020-08-10: qty 30

## 2020-08-10 MED ORDER — POTASSIUM CHLORIDE 20 MEQ PO PACK
40.0000 meq | PACK | Freq: Once | ORAL | Status: AC
  Administered 2020-08-10: 40 meq via ORAL
  Filled 2020-08-10: qty 2

## 2020-08-10 MED ORDER — FENTANYL CITRATE (PF) 100 MCG/2ML IJ SOLN
INTRAMUSCULAR | Status: AC
  Filled 2020-08-10: qty 2

## 2020-08-10 MED ORDER — HEPARIN (PORCINE) IN NACL 1000-0.9 UT/500ML-% IV SOLN
INTRAVENOUS | Status: AC
  Filled 2020-08-10: qty 500

## 2020-08-10 MED ORDER — ASPIRIN EC 81 MG PO TBEC
81.0000 mg | DELAYED_RELEASE_TABLET | Freq: Every day | ORAL | Status: DC
  Administered 2020-08-11: 81 mg via ORAL
  Filled 2020-08-10: qty 1

## 2020-08-10 MED ORDER — ONDANSETRON HCL 4 MG/2ML IJ SOLN: 4.0000 mg | Freq: Four times a day (QID) | INTRAMUSCULAR | Status: DC | PRN

## 2020-08-10 MED ORDER — HEPARIN SODIUM (PORCINE) 1000 UNIT/ML IJ SOLN
INTRAMUSCULAR | Status: AC
  Filled 2020-08-10: qty 1

## 2020-08-10 MED ORDER — SODIUM CHLORIDE 0.9 % IV SOLN: INTRAVENOUS | Status: DC

## 2020-08-10 MED ORDER — SODIUM CHLORIDE 0.9% FLUSH: 3.0000 mL | Freq: Two times a day (BID) | INTRAVENOUS | Status: DC

## 2020-08-10 MED ORDER — HYDRALAZINE HCL 20 MG/ML IJ SOLN: 5.0000 mg | INTRAMUSCULAR | Status: DC | PRN

## 2020-08-10 MED ORDER — MIDAZOLAM HCL 2 MG/2ML IJ SOLN
INTRAMUSCULAR | Status: DC | PRN
  Administered 2020-08-10: 1 mg via INTRAVENOUS

## 2020-08-10 MED ORDER — LABETALOL HCL 5 MG/ML IV SOLN: 10.0000 mg | INTRAVENOUS | Status: DC | PRN

## 2020-08-10 MED ORDER — HEPARIN SODIUM (PORCINE) 1000 UNIT/ML IJ SOLN
INTRAMUSCULAR | Status: DC | PRN
  Administered 2020-08-10: 6000 [IU] via INTRAVENOUS

## 2020-08-10 MED ORDER — MIDAZOLAM HCL 2 MG/2ML IJ SOLN
INTRAMUSCULAR | Status: AC
  Filled 2020-08-10: qty 2

## 2020-08-10 MED ORDER — SODIUM CHLORIDE 0.9% FLUSH
3.0000 mL | INTRAVENOUS | Status: DC | PRN
Start: 2020-08-10 — End: 2020-08-11

## 2020-08-10 MED ORDER — HEPARIN (PORCINE) IN NACL 1000-0.9 UT/500ML-% IV SOLN
INTRAVENOUS | Status: DC | PRN
Start: 2020-08-10 — End: 2020-08-10
  Administered 2020-08-10: 500 mL

## 2020-08-10 MED ORDER — HEPARIN (PORCINE) 25000 UT/250ML-% IV SOLN
1200.0000 [IU]/h | INTRAVENOUS | Status: DC
  Administered 2020-08-10 – 2020-08-11 (×2): 1200 [IU]/h via INTRAVENOUS
  Filled 2020-08-10 (×2): qty 250

## 2020-08-10 MED ORDER — SODIUM CHLORIDE 0.9 % IV SOLN: 250.0000 mL | INTRAVENOUS | Status: DC | PRN

## 2020-08-10 MED ORDER — ACETAMINOPHEN 325 MG PO TABS: 650.0000 mg | ORAL_TABLET | ORAL | Status: DC | PRN

## 2020-08-10 SURGICAL SUPPLY — 18 items
BAG SNAP BAND KOVER 36X36 (MISCELLANEOUS) ×3 IMPLANT
BALLN MUSTANG 10X80X75 (BALLOONS) ×3 IMPLANT
BALLOON MUSTANG 10X80X75 (BALLOONS) ×2 IMPLANT
CATH RETRIEVER CLOT 16MMX105CM (CATHETERS) ×3 IMPLANT
CATH VISIONS PV .035 IVUS (CATHETERS) ×3 IMPLANT
COVER DOME SNAP 22 D (MISCELLANEOUS) ×3 IMPLANT
GLIDEWIRE ADV .035X260CM (WIRE) ×3 IMPLANT
GLIDEWIRE NITREX 0.018X80X5 (WIRE) ×1
GUIDEWIRE NITREX 0.018X80X5 (WIRE) ×2 IMPLANT
KIT ENCORE 26 ADVANTAGE (KITS) ×3 IMPLANT
KIT MICROPUNCTURE NIT STIFF (SHEATH) ×3 IMPLANT
KIT PV (KITS) ×3 IMPLANT
PROTECTION STATION PRESSURIZED (MISCELLANEOUS) ×3 IMPLANT
SHEATH CLOT RETRIEVER (SHEATH) ×3 IMPLANT
SHEATH PINNACLE 8F 10CM (SHEATH) ×3 IMPLANT
SHEATH PROBE COVER 6X72 (BAG) ×3 IMPLANT
STATION PROTECTION PRESSURIZED (MISCELLANEOUS) ×2 IMPLANT
TRAY PV CATH (CUSTOM PROCEDURE TRAY) ×3 IMPLANT

## 2020-08-10 NOTE — Op Note (Signed)
Patient name: [name] [name] MRN: [id number] DOB: 28-Dec-1965 Sex: male  08/10/2020 Pre-operative Diagnosis: Extensive left lower extremity DVT Post-operative diagnosis:  Same Surgeon:  Erlene Quan C. Donzetta Matters, MD Procedure Performed: 1.  Ultrasound-guided cannulation left popliteal vein 2.  Left femoral, common femoral, external iliac and common iliac veins and IVC intravascular ultrasound 3.  Mechanical thrombectomy of left external iliac, left common femoral and left femoral veins with Inari clottriever 4.  Left lower extremity venography 5.  Balloon angioplasty left external iliac vein and left common femoral and femoral veins with 10 mm balloon 6.  Moderate sedation with fentanyl and Versed for 45 minutes  Indications: 55 year old male presented with 1 month of swelling in his left lower extremity found to have what appeared to be acute and chronic appearing thrombus in the left lower extremity.  He also apparently has swelling in the right lower extremity but this appears more chronic.  He is indicated for venogram with intravascular ultrasound and possible intervention.  Findings: The small saphenous vein did not appear to communicate to the deep system.  There were 2 femoral veins.  The femoral vein that I cannulated was very diminutive throughout the femoral course of the common femoral vein was much larger.  There was a very small channel there at the beginning there appeared to be a tunnel thrombus extending up from the common femoral vein into the external iliac vein just to the level of the hypogastric vein.  After mechanical thrombectomy there was no longer tongue there.  We had completely open external leg vein.  There was an 8 mm lumen after balloon angioplasty of the common femoral vein where previously there was only a very tiny lumen and was subtotally occluded.  Completion venography demonstrated no reflux into collateral veins with brisk flow centrally to the IVC.  Plan will be for  long-term anticoagulation given patient's likely previous DVT and high risk of recurrence.   Procedure:  The patient was identified in the holding area and taken to room 8.  The patient was then placed prone on the table and prepped and draped in the usual sterile fashion.  A time out was called.  Ultrasound was used to evaluate the left small saphenous vein this did not appear to communicate to the deep system.  I cannulated one of the femoral veins which appeared to be the smaller but more superficial.  This was done after the area was anesthetized with 1% lidocaine.  Patient was also administered moderate sedation with fentanyl and Versed his vital signs were monitored throughout the course of the case.  A micropuncture wire was placed followed by micropuncture sheath Glidewire advantage patient was given additional 6000 units of heparin.  The wire was easily traversed up to the level of the right subclavian vein.  An 8 French sheath was placed.  Intravascular ultrasound was performed from the femoral vein all the way through the IVC including the femoral, common femoral, external iliac and common iliac veins on the left as well as the IVC with the above findings.  We then exchanged for the mechanical thrombectomy sheath.  Mechanical thrombectomy was performed where we had a tongue of thrombus extending in the external leg vein we performed this all the way down through the common femoral and femoral veins as well.  Completion IVUS demonstrated improvement in channel no further acute appearing thrombus.  We then performed balloon angioplasty with 10 mm balloon of the external iliac vein as well as the  common femoral and femoral veins.  Completion IVUS demonstrated 8 mm lumen where previously was subtotally occluded in the common femoral vein.  Completion venography demonstrated no reflux into collateral veins with brisk flow centrally.  Satisfied with this the wire was removed.  A bolster was fashioned and the  sheath was removed and the bolster was fashioned in place with 0 silk suture.  He tolerated procedure well without immediate complication   Contrast: 30 cc  Cecila Satcher C. Donzetta Matters, MD [facility] [place] Office: [phone] Pager: [phone]

## 2020-08-10 NOTE — Progress Notes (Signed)
ANTICOAGULATION CONSULT NOTE  Pharmacy Consult for heparin Indication: DVT  No Known Allergies  Patient Measurements: Height: 6' (182.9 cm) Weight: 80.7 kg (178 lb) IBW/kg (Calculated) : 77.6 Heparin Dosing Weight: 80.7 kg  Vital Signs: Temp: 97.7 F (36.5 C) (04/12 0725) Temp Source: Oral (04/12 0725) BP: 126/74 (04/12 1011) Pulse Rate: 76 (04/12 1011)  Labs: Recent Labs    08/08/20 0040 08/09/20 0119 08/09/20 0626 08/10/20 0246  HGB 10.2*  --  10.3* 10.6*  HCT 30.6*  --  30.6* 31.7*  PLT 442*  --  398 406*  HEPARINUNFRC 0.67 0.58  --  0.69  CREATININE  --  1.07  --  0.99    Estimated Creatinine Clearance: 92.5 mL/min (by C-G formula based on SCr of 0.99 mg/dL).   Medical History: Past Medical History:  Diagnosis Date  . Asthma   . Dyspnea   . High cholesterol   . History of kidney stones   . Hypertension   . Kidney stones     Assessment: 55yoM with LLE DVT - pt is a candidate for thrombectomy but needs to be delayed d/t significant problems with diarrhea. Pharmacy consulted to dose IV heparin. Of note patient is not on anticoagulation prior to admission.   Heparin level this morning therapeutic at 0.69 on heparin 1,300 units/hr, now at upper end of goal. No s/sx bleeding noted. Hgb 10s, PLT wnl.  Patient went for vascular cath this morning for mechanical thrombectomy and angioplasty. Orders received to restart heparin at noon and plans are for long term anticoagulation given high risk of recurrence.   Benefits check for DOAC already completed = $3  Goal of Therapy:  Heparin level 0.3-0.7 units/ml Monitor platelets by anticoagulation protocol: Yes   Plan:  Will restart heparin at 1200 units/hr - a slightly lower rate given accumulation overnight Monitor daily HL and CBC   Thank you for allowing [place] to participate in this patients care.  Erin Hearing PharmD., BCPS Clinical Pharmacist 08/10/2020 10:30 AM   Please check [email or web address] for unit-specific  pharmacy phone numbers.

## 2020-08-10 NOTE — Progress Notes (Signed)
PROGRESS NOTE    [name] [name]  VHQ:[id number] DOB: 1966-02-10 DOA: 08/05/2020 PCP: Rosita Fire, MD    Brief Narrative:  This 55 years old male with PMH significant for alcoholic liver disease (last EtOH intake was in February) Hypertension, Hyperlipidemia, Tobacco abuse, COPD presented in the ED with C/O: weight loss, loss of appetite,  regurgitation as well as worsening edema in lower extremities despite being on Lasix and Aldactone use.  He was seen by GI on 4/6 , Lower extremity Doppler was ordered for worsening left leg swelling. Venous duplex demonstrated extensive lower extremity DVT.  Case was discussed with vascular surgeon Dr. Donzetta Matters who recommended transfer to [facility] for further vascular surgery evaluation.  CT venogram was done which shows extensive left lower extremity DVT beginning at the level of left external iliac vein and extending into the common femoral, superficial femoral and deep femoral veins to the level of popliteal vein.  Vascular surgery recommended thrombectomy on 4/12.  Assessment & Plan:   Principal Problem:   DVT (deep venous thrombosis) -Lt LL Extensive DVT Active Problems:   Essential hypertension, benign   Cigarette nicotine dependence with nicotine-induced disorder   Alcoholic cirrhosis of liver with ascites (HCC)   COPD (chronic obstructive pulmonary disease) (HCC)   Hypokalemia   Extensive left lower extremity DVT:  Patient was found to have left leg swelling, venous duplex confirmed the DVT Patient was transferred from [facility] for vascular surgery evaluation.  He has been seen by Dr. Vella Redhead.  Patient underwent venogram,  found to have extensive left lower extremity DVT.  Vascular surgery planned thrombectomy on friday but due to patient having diarrhea, procedure was canceled on Friday, rescheduled on Tuesday. Continue heparin gtt. monitor APTT per pharmacy. Patient underwent mechanical thrombectomy and balloon angioplasty on 4/12,   tolerated well. Patient will remain on long-term anticoagulation given previous DVT and high risk of recurrence.  History of alcoholic liver cirrhosis and chronic alcoholic gastritis:  Viral hepatitis negative. He reports recent EGD with normal esophagus but mild chronic gastritis.   Last alcoholic intake reportedly was in February 2022.  Normal LFTs.  There is no indication of GI consultation  inpatient.  Hypokalemia: Resolved.  Diarrhea: Resolved.  Patient developed loose watery stools.   C. difficile is negative, GI panel is negative.   Contact isolation is discontinued. Patient report diarrhea has resolved.  COPD : Not in acute exacerbation.   Continue bronchodilators and nicotine patch.   Cessation counseling provided.  Acute anemia:  Patient's hemoglobin this time upon presentation was 12.2 from previously being normal.  However hemoglobin has remained stable since admission.  Iron studies indicate anemia of chronic disease. unfortunately, his FOBT is positive recent diagnosis of gastritis.  Monitor H&H daily.  Essential hypertension:  Blood pressure is well controlled off BP medications. Continue to hold antihypertensives.  Hyperlipidemia: Continue statin.  Tobacco abuse:  Smokes 1 pack of cigarettes per day.   Continue nicotine patch   DVT prophylaxis: Heparin gtt Code Status:  Full code. Family Communication:  No family at bedside. Disposition Plan:  Status is: Inpatient  Remains inpatient appropriate because:Inpatient level of care appropriate due to severity of illness   Dispo: The patient is from: Home              Anticipated d/c is to: Home              Patient currently is not medically stable to d/c.   Difficult to place patient  No   Consultants:   Vascular surgery  Procedures: Successful thrombectomy and angioplasty. Antimicrobials:   Anti-infectives (From admission, onward)   None     Subjective: Patient was seen and examined at  bedside.  Overnight events noted.   Patient reports feeling better, He underwent thrombectomy , reports soreness in the left leg.  Objective: Vitals:   08/10/20 1001 08/10/20 1006 08/10/20 1011 08/10/20 1058  BP: 106/65 114/68 126/74 120/67  Pulse: 80 88 76 73  Resp: 18 18 15 20   Temp:    97.7 F (36.5 C)  TempSrc:    Oral  SpO2: 100% 100% 100% 99%  Weight:      Height:        Intake/Output Summary (Last 24 hours) at 08/10/2020 1525 Last data filed at 08/10/2020 1500 Gross per 24 hour  Intake 1559.08 ml  Output 180 ml  Net 1379.08 ml   Filed Weights   08/05/20 1058  Weight: 80.7 kg    Examination:  General exam: Appears calm and comfortable, not in any acute distress. Respiratory system: Clear to auscultation. Respiratory effort normal. Cardiovascular system: S1 & S2 heard, RRR. No JVD, murmurs, rubs, gallops or clicks. No pedal edema. Gastrointestinal system: Abdomen is nondistended, soft and nontender. No organomegaly or masses felt.  Normal bowel sounds heard. Central nervous system: Alert and oriented. No focal neurological deficits. Extremities: Symmetric 5 x 5 power.  Left lower extremity swelling, tenderness noted. Skin: No rashes, lesions or ulcers Psychiatry: Judgement and insight appear normal. Mood & affect appropriate.     Data Reviewed: I have personally reviewed following labs and imaging studies  CBC: Recent Labs  Lab 08/05/20 1236 08/06/20 0445 08/07/20 0102 08/08/20 0040 08/09/20 0626 08/10/20 0246  WBC 10.4 10.1 9.9 9.7 10.3 11.0*  NEUTROABS 6.9  --  5.1 4.9 5.7 5.6  HGB 12.2* 11.2* 9.6* 10.2* 10.3* 10.6*  HCT 37.1* 33.3* 29.2* 30.6* 30.6* 31.7*  MCV 91.4 90.0 91.8 90.3 90.5 91.1  PLT 452* 414* 393 442* 398 182*   Basic Metabolic Panel: Recent Labs  Lab 08/05/20 1236 08/05/20 1648 08/06/20 0445 08/07/20 0102 08/09/20 0119 08/10/20 0246  NA 136  --  137 137 137 135  K 3.0*  --  3.8 3.5 3.3* 3.2*  CL 105  --  110 115* 110 107  CO2  22  --  22 17* 22 23  GLUCOSE 89  --  89 93 90 79  BUN <5*  --  <5* <5* <5* <5*  CREATININE 1.06  --  1.00 1.02 1.07 0.99  CALCIUM 7.5*  --  8.2* 8.0* 7.5* 7.7*  MG  --  1.5*  --   --  1.2* 2.0  PHOS  --   --   --   --  3.6 3.6   GFR: Estimated Creatinine Clearance: 92.5 mL/min (by C-G formula based on SCr of 0.99 mg/dL). Liver Function Tests: Recent Labs  Lab 08/05/20 1236 08/07/20 0102  AST 31 23  ALT 12 10  ALKPHOS 95 73  BILITOT 0.6 0.6  PROT 6.7 5.2*  ALBUMIN 1.8* 1.3*   No results for input(s): LIPASE, AMYLASE in the last 168 hours. No results for input(s): AMMONIA in the last 168 hours. Coagulation Profile: Recent Labs  Lab 08/05/20 1236  INR 1.2   Cardiac Enzymes: No results for input(s): CKTOTAL, CKMB, CKMBINDEX, TROPONINI in the last 168 hours. BNP (last 3 results) No results for input(s): PROBNP in the last 8760 hours. HbA1C: No results for  input(s): HGBA1C in the last 72 hours. CBG: Recent Labs  Lab 08/07/20 1644  GLUCAP 113*   Lipid Profile: No results for input(s): CHOL, HDL, LDLCALC, TRIG, CHOLHDL, LDLDIRECT in the last 72 hours. Thyroid Function Tests: No results for input(s): TSH, T4TOTAL, FREET4, T3FREE, THYROIDAB in the last 72 hours. Anemia Panel: No results for input(s): VITAMINB12, FOLATE, FERRITIN, TIBC, IRON, RETICCTPCT in the last 72 hours. Sepsis Labs: No results for input(s): PROCALCITON, LATICACIDVEN in the last 168 hours.  Recent Results (from the past 240 hour(s))  Resp Panel by RT-PCR (Flu A&B, Covid) Nasopharyngeal Swab     Status: None   Collection Time: 08/05/20 12:39 PM   Specimen: Nasopharyngeal Swab; Nasopharyngeal(NP) swabs in vial transport medium  Result Value Ref Range Status   SARS Coronavirus 2 by RT PCR NEGATIVE NEGATIVE Final    Comment: (NOTE) SARS-CoV-2 target nucleic acids are NOT DETECTED.  The SARS-CoV-2 RNA is generally detectable in upper respiratory specimens during the acute phase of infection. The  lowest concentration of SARS-CoV-2 viral copies this assay can detect is 138 copies/mL. A negative result does not preclude SARS-Cov-2 infection and should not be used as the sole basis for treatment or other patient management decisions. A negative result may occur with  improper specimen collection/handling, submission of specimen other than nasopharyngeal swab, presence of viral mutation(s) within the areas targeted by this assay, and inadequate number of viral copies(<138 copies/mL). A negative result must be combined with clinical observations, patient history, and epidemiological information. The expected result is Negative.  Fact Sheet for Patients:  EntrepreneurPulse.com.au  Fact Sheet for Healthcare Providers:  IncredibleEmployment.be  This test is no t yet approved or cleared by the [place] FDA and  has been authorized for detection and/or diagnosis of SARS-CoV-2 by FDA under an Emergency Use Authorization (EUA). This EUA will remain  in effect (meaning this test can be used) for the duration of the COVID-19 declaration under Section 564(b)(1) of the Act, 21 U.S.C.section 360bbb-3(b)(1), unless the authorization is terminated  or revoked sooner.       Influenza A by PCR NEGATIVE NEGATIVE Final   Influenza B by PCR NEGATIVE NEGATIVE Final    Comment: (NOTE) The Xpert Xpress SARS-CoV-2/FLU/RSV plus assay is intended as an aid in the diagnosis of influenza from Nasopharyngeal swab specimens and should not be used as a sole basis for treatment. Nasal washings and aspirates are unacceptable for Xpert Xpress SARS-CoV-2/FLU/RSV testing.  Fact Sheet for Patients: EntrepreneurPulse.com.au  Fact Sheet for Healthcare Providers: IncredibleEmployment.be  This test is not yet approved or cleared by the [place] FDA and has been authorized for detection and/or diagnosis of SARS-CoV-2 by FDA under  an Emergency Use Authorization (EUA). This EUA will remain in effect (meaning this test can be used) for the duration of the COVID-19 declaration under Section 564(b)(1) of the Act, 21 U.S.C. section 360bbb-3(b)(1), unless the authorization is terminated or revoked.  Performed at [facility], [address], [place], [place] [address]   C Difficile Quick Screen w PCR reflex     Status: None   Collection Time: 08/05/20 10:52 PM   Specimen: Stool  Result Value Ref Range Status   C Diff antigen NEGATIVE NEGATIVE Final   C Diff toxin NEGATIVE NEGATIVE Final   C Diff interpretation No C. difficile detected.  Final    Comment: Performed at [facility], [place] [address], [place], [place] [address]  Gastrointestinal Panel by PCR , Stool  Status: None   Collection Time: 08/07/20 11:35 AM   Specimen: Stool  Result Value Ref Range Status   Campylobacter species NOT DETECTED NOT DETECTED Final   Plesimonas shigelloides NOT DETECTED NOT DETECTED Final   Salmonella species NOT DETECTED NOT DETECTED Final   Yersinia enterocolitica NOT DETECTED NOT DETECTED Final   Vibrio species NOT DETECTED NOT DETECTED Final   Vibrio cholerae NOT DETECTED NOT DETECTED Final   Enteroaggregative E coli (EAEC) NOT DETECTED NOT DETECTED Final   Enteropathogenic E coli (EPEC) NOT DETECTED NOT DETECTED Final   Enterotoxigenic E coli (ETEC) NOT DETECTED NOT DETECTED Final   Shiga like toxin producing E coli (STEC) NOT DETECTED NOT DETECTED Final   Shigella/Enteroinvasive E coli (EIEC) NOT DETECTED NOT DETECTED Final   Cryptosporidium NOT DETECTED NOT DETECTED Final   Cyclospora cayetanensis NOT DETECTED NOT DETECTED Final   Entamoeba histolytica NOT DETECTED NOT DETECTED Final   Giardia lamblia NOT DETECTED NOT DETECTED Final   Adenovirus F40/41 NOT DETECTED NOT DETECTED Final   Astrovirus NOT DETECTED NOT DETECTED Final   Norovirus GI/GII NOT DETECTED NOT DETECTED Final   Rotavirus A NOT DETECTED NOT  DETECTED Final   Sapovirus (I, II, IV, and V) NOT DETECTED NOT DETECTED Final    Comment: Performed at [facility], [address] [place], [place] [address]  Surgical pcr screen     Status: None   Collection Time: 08/09/20  9:00 PM   Specimen: Nasal Mucosa; Nasal Swab  Result Value Ref Range Status   MRSA, PCR NEGATIVE NEGATIVE Final   Staphylococcus aureus NEGATIVE NEGATIVE Final    Comment: (NOTE) The Xpert SA Assay (FDA approved for NASAL specimens in patients 39 years of age and older), is one component of a comprehensive surveillance program. It is not intended to diagnose infection nor to guide or monitor treatment. Performed at [facility], [place] [address] [place], [place] [address]     Radiology Studies: PERIPHERAL VASCULAR CATHETERIZATION  Result Date: 08/10/2020 Patient name: [name] [name] MRN: [id number] DOB: 11/29/1965 Sex: male 08/10/2020 Pre-operative Diagnosis: Extensive left lower extremity DVT Post-operative diagnosis:  Same Surgeon:  Erlene Quan C. Donzetta Matters, MD Procedure Performed: 1.  Ultrasound-guided cannulation left popliteal vein 2.  Left femoral, common femoral, external iliac and common iliac veins and IVC intravascular ultrasound 3.  Mechanical thrombectomy of left external iliac, left common femoral and left femoral veins with Inari clottriever 4.  Left lower extremity venography 5.  Balloon angioplasty left external iliac vein and left common femoral and femoral veins with 10 mm balloon 6.  Moderate sedation with fentanyl and Versed for 45 minutes Indications: 55 year old male presented with 1 month of swelling in his left lower extremity found to have what appeared to be acute and chronic appearing thrombus in the left lower extremity.  He also apparently has swelling in the right lower extremity but this appears more chronic.  He is indicated for venogram with intravascular ultrasound and possible intervention. Findings: The small saphenous vein did not  appear to communicate to the deep system.  There were 2 femoral veins.  The femoral vein that I cannulated was very diminutive throughout the femoral course of the common femoral vein was much larger.  There was a very small channel there at the beginning there appeared to be a tunnel thrombus extending up from the common femoral vein into the external iliac vein just to the level of the hypogastric vein.  After mechanical thrombectomy there was no  longer tongue there.  We had completely open external leg vein.  There was an 8 mm lumen after balloon angioplasty of the common femoral vein where previously there was only a very tiny lumen and was subtotally occluded.  Completion venography demonstrated no reflux into collateral veins with brisk flow centrally to the IVC. Plan will be for long-term anticoagulation given patient's likely previous DVT and high risk of recurrence.  Procedure:  The patient was identified in the holding area and taken to room 8.  The patient was then placed prone on the table and prepped and draped in the usual sterile fashion.  A time out was called.  Ultrasound was used to evaluate the left small saphenous vein this did not appear to communicate to the deep system.  I cannulated one of the femoral veins which appeared to be the smaller but more superficial.  This was done after the area was anesthetized with 1% lidocaine.  Patient was also administered moderate sedation with fentanyl and Versed his vital signs were monitored throughout the course of the case.  A micropuncture wire was placed followed by micropuncture sheath Glidewire advantage patient was given additional 6000 units of heparin.  The wire was easily traversed up to the level of the right subclavian vein.  An 8 French sheath was placed.  Intravascular ultrasound was performed from the femoral vein all the way through the IVC including the femoral, common femoral, external iliac and common iliac veins on the left as well as  the IVC with the above findings.  We then exchanged for the mechanical thrombectomy sheath.  Mechanical thrombectomy was performed where we had a tongue of thrombus extending in the external leg vein we performed this all the way down through the common femoral and femoral veins as well.  Completion IVUS demonstrated improvement in channel no further acute appearing thrombus.  We then performed balloon angioplasty with 10 mm balloon of the external iliac vein as well as the common femoral and femoral veins.  Completion IVUS demonstrated 8 mm lumen where previously was subtotally occluded in the common femoral vein.  Completion venography demonstrated no reflux into collateral veins with brisk flow centrally.  Satisfied with this the wire was removed.  A bolster was fashioned and the sheath was removed and the bolster was fashioned in place with 0 silk suture.  He tolerated procedure well without immediate complication Contrast: 30 cc Brandon C. Donzetta Matters, MD [facility] Office: [phone] Pager: [phone]   Scheduled Meds: . [START ON 08/11/2020] aspirin EC  81 mg Oral Daily  . atorvastatin  20 mg Oral Daily  . furosemide  20 mg Oral Daily  . mometasone-formoterol  2 puff Inhalation BID  . nicotine  14 mg Transdermal Daily  . pantoprazole  40 mg Oral Daily  . potassium chloride  40 mEq Oral Once  . sodium chloride flush  3 mL Intravenous Q12H  . sodium chloride flush  3 mL Intravenous Q12H   Continuous Infusions: . sodium chloride    . sodium chloride 100 mL/hr at 08/10/20 0648  . sodium chloride    . sodium chloride 1 mL/kg/hr (08/10/20 1052)  . heparin 1,200 Units/hr (08/10/20 1500)     LOS: 4 days    Time spent: 25 mins    Emmaleigh Longo, MD [facility]   If 7PM-7AM, please contact night-coverage

## 2020-08-11 ENCOUNTER — Other Ambulatory Visit (HOSPITAL_COMMUNITY): Payer: Self-pay

## 2020-08-11 ENCOUNTER — Telehealth: Payer: Self-pay

## 2020-08-11 DIAGNOSIS — I824Z2 Acute embolism and thrombosis of unspecified deep veins of left distal lower extremity: Secondary | ICD-10-CM

## 2020-08-11 DIAGNOSIS — R195 Other fecal abnormalities: Secondary | ICD-10-CM

## 2020-08-11 DIAGNOSIS — I959 Hypotension, unspecified: Secondary | ICD-10-CM

## 2020-08-11 DIAGNOSIS — E8809 Other disorders of plasma-protein metabolism, not elsewhere classified: Secondary | ICD-10-CM

## 2020-08-11 DIAGNOSIS — F17219 Nicotine dependence, cigarettes, with unspecified nicotine-induced disorders: Secondary | ICD-10-CM

## 2020-08-11 DIAGNOSIS — D638 Anemia in other chronic diseases classified elsewhere: Secondary | ICD-10-CM

## 2020-08-11 LAB — CBC WITH DIFFERENTIAL/PLATELET
Abs Immature Granulocytes: 0.06 10*3/uL (ref 0.00–0.07)
Basophils Absolute: 0.1 10*3/uL (ref 0.0–0.1)
Basophils Relative: 1 %
Eosinophils Absolute: 0.3 10*3/uL (ref 0.0–0.5)
Eosinophils Relative: 3 %
HCT: 30.6 % — ABNORMAL LOW (ref 39.0–52.0)
Hemoglobin: 10.1 g/dL — ABNORMAL LOW (ref 13.0–17.0)
Immature Granulocytes: 1 %
Lymphocytes Relative: 27 %
Lymphs Abs: 3.2 10*3/uL (ref 0.7–4.0)
MCH: 29.6 pg (ref 26.0–34.0)
MCHC: 33 g/dL (ref 30.0–36.0)
MCV: 89.7 fL (ref 80.0–100.0)
Monocytes Absolute: 1.4 10*3/uL — ABNORMAL HIGH (ref 0.1–1.0)
Monocytes Relative: 12 %
Neutro Abs: 6.6 10*3/uL (ref 1.7–7.7)
Neutrophils Relative %: 56 %
Platelets: 357 10*3/uL (ref 150–400)
RBC: 3.41 MIL/uL — ABNORMAL LOW (ref 4.22–5.81)
RDW: 14.6 % (ref 11.5–15.5)
WBC: 11.7 10*3/uL — ABNORMAL HIGH (ref 4.0–10.5)
nRBC: 0 % (ref 0.0–0.2)

## 2020-08-11 LAB — COMPREHENSIVE METABOLIC PANEL
ALT: 13 U/L (ref 0–44)
AST: 35 U/L (ref 15–41)
Albumin: 1.4 g/dL — ABNORMAL LOW (ref 3.5–5.0)
Alkaline Phosphatase: 94 U/L (ref 38–126)
Anion gap: 4 — ABNORMAL LOW (ref 5–15)
BUN: <5 mg/dL — ABNORMAL LOW (ref 6–20)
CO2: 23 mmol/L (ref 22–32)
Calcium: 7.7 mg/dL — ABNORMAL LOW (ref 8.9–10.3)
Chloride: 109 mmol/L (ref 98–111)
Creatinine, Ser: 0.98 mg/dL (ref 0.61–1.24)
GFR, Estimated: >60 mL/min (ref >60)
Glucose, Bld: 100 mg/dL — ABNORMAL HIGH (ref 70–99)
Potassium: 3.6 mmol/L (ref 3.5–5.1)
Sodium: 136 mmol/L (ref 135–145)
Total Bilirubin: 0.5 mg/dL (ref 0.3–1.2)
Total Protein: 5.6 g/dL — ABNORMAL LOW (ref 6.5–8.1)

## 2020-08-11 LAB — PHOSPHORUS: Phosphorus: 4.3 mg/dL (ref 2.5–4.6)

## 2020-08-11 LAB — MAGNESIUM: Magnesium: 1.6 mg/dL — ABNORMAL LOW (ref 1.7–2.4)

## 2020-08-11 MED ORDER — APIXABAN 5 MG PO TABS
10.0000 mg | ORAL_TABLET | Freq: Two times a day (BID) | ORAL | Status: DC
  Administered 2020-08-11: 10 mg via ORAL
  Filled 2020-08-11: qty 2

## 2020-08-11 MED ORDER — NICOTINE 14 MG/24HR TD PT24
14.0000 mg | MEDICATED_PATCH | Freq: Every day | TRANSDERMAL | 0 refills | Status: DC
  Filled 2020-08-11: qty 14, 14d supply, fill #0

## 2020-08-11 MED ORDER — APIXABAN 5 MG PO TABS: 5.0000 mg | ORAL_TABLET | Freq: Two times a day (BID) | ORAL | Status: DC

## 2020-08-11 MED ORDER — NICOTINE 14 MG/24HR TD PT24: 14.0000 mg | MEDICATED_PATCH | Freq: Every day | TRANSDERMAL | 0 refills | Status: DC

## 2020-08-11 MED ORDER — NICOTINE 7 MG/24HR TD PT24
7.0000 mg | MEDICATED_PATCH | Freq: Every day | TRANSDERMAL | 0 refills | Status: DC
  Filled 2020-08-11: qty 14, 14d supply, fill #0

## 2020-08-11 MED ORDER — NICOTINE 7 MG/24HR TD PT24: 7.0000 mg | MEDICATED_PATCH | Freq: Every day | TRANSDERMAL | 0 refills | Status: DC

## 2020-08-11 MED ORDER — APIXABAN (ELIQUIS) VTE STARTER PACK (10MG AND 5MG): ORAL_TABLET | ORAL | 0 refills | Status: DC

## 2020-08-11 MED ORDER — APIXABAN (ELIQUIS) VTE STARTER PACK (10MG AND 5MG)
ORAL_TABLET | ORAL | 0 refills | Status: DC
  Filled 2020-08-11: qty 74, 28d supply, fill #0

## 2020-08-11 MED ORDER — MAGNESIUM SULFATE 2 GM/50ML IV SOLN
2.0000 g | Freq: Once | INTRAVENOUS | Status: AC
  Administered 2020-08-11: 2 g via INTRAVENOUS
  Filled 2020-08-11: qty 50

## 2020-08-11 NOTE — Discharge Instructions (Signed)
You were cared for by a hospitalist during your hospital stay. If you have any questions about your discharge medications or the care you received while you were in the hospital after you are discharged, you can call the unit and asked to speak with the hospitalist on call if the hospitalist that took care of you is not available. Once you are discharged, your primary care physician will handle any further medical issues.   Please note that NO REFILLS for any discharge medications will be authorized once you are discharged, as it is imperative that you return to your primary care physician (or establish a relationship with a primary care physician if you do not have one) for your aftercare needs so that they can reassess your need for medications and monitor your lab values.  Please take all your medications with you for your next visit with your Primary MD. Please ask your Primary MD to get all Hospital records sent to his/her office. Please request your Primary MD to go over all hospital test results at the follow up.   If you experience worsening of your admission symptoms, develop shortness of breath, chest pain, suicidal or homicidal thoughts or a life threatening emergency, you must seek medical attention immediately by calling 911 or calling your MD.   Dennis Bast must read the complete instructions/literature along with all the possible adverse reactions/side effects for all the medicines you take including new medications that have been prescribed to you. Take new medicines after you have completely understood and accpet all the possible adverse reactions/side effects.    Do not drive when taking pain medications or sedatives.     Do not take more than prescribed Pain, Sleep and Anxiety Medications   If you have smoked or chewed Tobacco in the last 2 yrs please stop. Stop any regular alcohol  and or recreational drug use.   Wear Seat belts while driving.    Deep Vein Thrombosis  Deep vein  thrombosis (DVT) is a condition in which a blood clot forms in a deep vein, such as a vein in the lower leg, thigh, pelvis, or arm. Deep veins are veins in the deep venous system. A clot is blood that has thickened into a gel or solid. This condition is serious and can be life-threatening if the clot travels to the lungs and causes a blockage (pulmonary embolism) in the arteries of the lung. A DVT can also damage veins in the leg. This can lead to long-term, or chronic, venous disease, leg pain, swelling, discoloration, and ulcers or sores (post-thrombotic syndrome). What are the causes? This condition may be caused by:  A slowdown of blood flow.  Damage to a vein.  A condition that causes blood to clot more easily, such as certain blood-clotting disorders. What increases the risk? The following factors may make you more likely to develop this condition:  Having obesity.  Being older, especially older than age 19.  Being inactive (sedentary lifestyle) or not moving around. This may include: ? Sitting or lying down for longer than 4-6 hours other than to sleep at night. ? Being in the hospital, having major or lengthy surgery, or having a thin, flexible tube (central line catheter) placed in a large vein.  Being pregnant, giving birth, or having recently given birth.  Taking medicines that contain estrogen, such as birth control or hormone replacement therapy.  Using products that contain nicotine or tobacco, especially if you use hormonal birth control.  Having a history of  blood clots or a blood-clotting disease, a blood vessel disease (peripheral vascular disease), or congestive heart disease.  Having a history of cancer, especially if being treated with chemotherapy. What are the signs or symptoms? Symptoms of this condition include:  Swelling, pain, pressure, or tenderness in an arm or a leg.  An arm or a leg becoming warm, red, or discolored.  A leg turning very pale. You may  have a large DVT. This is rare. If the clot is in your leg, you may notice symptoms more or have worse symptoms when you stand or walk. In some cases, there are no symptoms. How is this diagnosed? This condition is diagnosed with:  Your medical history and a physical exam.  Tests, such as: ? Blood tests to check how well your blood clots. ? Doppler ultrasound. This is the best way to find a DVT. ? Venogram. Contrast dye is injected into a vein, and X-rays are taken to check for clots. How is this treated? Treatment for this condition depends on:  The cause of your DVT.  The size and location of your DVT, or having more than one DVT.  Your risk for bleeding or developing more clots.  Other medical conditions you may have. Treatment may include:  Taking a blood thinner, also called an anticoagulant, to prevent clots from forming and growing.  Wearing compression stockings, if directed.  Injecting medicines into the affected vein to break up the clot (catheter-directed thrombolysis). This is used only for severe DVT and only if a specialist recommends it.  Specific surgical procedures, when DVT is severe or hard to treat. These may be done to: ? Isolate and remove your clot. ? Place an inferior vena cava (IVC) filter in a large vein to catch blood clots before they reach your lungs. You may get some medical treatments for 6 months or longer. Follow these instructions at home: If you are taking blood thinners:  Talk with your health care provider before you take any medicines that contain aspirin or NSAIDs, such as ibuprofen. These medicines increase your risk for dangerous bleeding.  Take your medicine exactly as told, at the same time every day. Do not skip a dose. Do not take more than the prescribed dose. This is important.  Ask your health care provider about foods and medicines that could change the way your blood thinner works (may interact). Avoid these foods and  medicines if you are told to do so.  Avoid anything that may cause bleeding or bruising. You may bleed more easily while taking blood thinners. ? Be very careful when using knives, scissors, or other sharp objects. ? Use an electric razor instead of a blade. ? Avoid activities that could cause injury or bruising, and follow instructions for preventing falls. ? Tell your health care provider if you have had any internal bleeding, bleeding ulcers, or neurologic diseases, such as strokes or cerebral aneurysms.  Wear a medical alert bracelet or carry a card that lists what medicines you take. General instructions  Take over-the-counter and prescription medicines only as told by your health care provider.  Return to your normal activities as told by your health care provider. Ask your health care provider what activities are safe for you.  If recommended, wear compression stockings as told by your health care provider. These stockings help to prevent blood clots and reduce swelling in your legs.  Keep all follow-up visits as told by your health care provider. This is important. Contact a  health care provider if:  You miss a dose of your blood thinner.  You have new or worse pain, swelling, or redness in an arm or a leg.  You have worsening numbness or tingling in an arm or a leg.  You have unusual bruising. Get help right away if:  You have signs or symptoms that a blood clot has moved to the lungs. These may include: ? Shortness of breath. ? Chest pain. ? Fast or irregular heartbeats (palpitations). ? Light-headedness or dizziness. ? Coughing up blood.  You have signs or symptoms that your blood is too thin. These may include: ? Blood in your vomit, stool, or urine. ? A cut that will not stop bleeding. ? A menstrual period that is heavier than usual. ? A severe headache or confusion. These symptoms may represent a serious problem that is an emergency. Do not wait to see if the  symptoms will go away. Get medical help right away. Call your local emergency services (911 in the [place] Do not drive yourself to the hospital. Summary  Deep vein thrombosis (DVT) happens when a blood clot forms in a deep vein. This may occur in the lower leg, thigh, pelvis, or arm.  Symptoms affect the arm or leg and can include swelling, pain, tenderness, warmth, redness, or discoloration.  This condition may be treated with medicines or compression stockings. In severe cases, surgery may be done.  If you are taking blood thinners, take them exactly as told. Do not skip a dose. Do not take more than is prescribed.  Get help right away if you have shortness of breath, chest pain, fast or irregular heartbeats, or blood in your vomit, urine, or stool. This information is not intended to replace advice given to you by your health care provider. Make sure you discuss any questions you have with your health care provider. Document Revised: 04/12/2019 Document Reviewed: 04/12/2019 Elsevier Patient Education  2021 [employer] on my medicine - ELIQUIS (apixaban)   Why was Eliquis prescribed for you? Eliquis was prescribed to treat blood clots that may have been found in the veins of your legs (deep vein thrombosis) or in your lungs (pulmonary embolism) and to reduce the risk of them occurring again.  What do You need to know about Eliquis ? The starting dose is 10 mg (two 5 mg tablets) taken TWICE daily for the FIRST SEVEN (7) DAYS, then on 4/20  the dose is reduced to ONE 5 mg tablet taken TWICE daily.  Eliquis may be taken with or without food.   Try to take the dose about the same time in the morning and in the evening. If you have difficulty swallowing the tablet whole please discuss with your pharmacist how to take the medication safely.  Take Eliquis exactly as prescribed and DO NOT stop taking Eliquis without talking to the doctor who prescribed the medication.   Stopping may increase your risk of developing a new blood clot.  Refill your prescription before you run out.  After discharge, you should have regular check-up appointments with your healthcare provider that is prescribing your Eliquis.    What do you do if you miss a dose? If a dose of ELIQUIS is not taken at the scheduled time, take it as soon as possible on the same day and twice-daily administration should be resumed. The dose should not be doubled to make up for a missed dose.  Important Safety Information A possible side effect of Eliquis  is bleeding. You should call your healthcare provider right away if you experience any of the following: ? Bleeding from an injury or your nose that does not stop. ? Unusual colored urine (red or dark brown) or unusual colored stools (red or black). ? Unusual bruising for unknown reasons. ? A serious fall or if you hit your head (even if there is no bleeding).  Some medicines may interact with Eliquis and might increase your risk of bleeding or clotting while on Eliquis. To help avoid this, consult your healthcare provider or pharmacist prior to using any new prescription or non-prescription medications, including herbals, vitamins, non-steroidal anti-inflammatory drugs (NSAIDs) and supplements.  This website has more information on Eliquis (apixaban): http://www.eliquis.com/eliquis/home

## 2020-08-11 NOTE — Progress Notes (Addendum)
Progress Note    08/11/2020 7:42 AM 1 Day Post-Op  Subjective:  Rested well. No complaints.   Vitals:   08/11/20 0400 08/11/20 0735  BP: 109/77 107/86  Pulse: 85 (!) 101  Resp: 17 18  Temp: 98.5 F (36.9 C) 98.8 F (37.1 C)  SpO2: 100% 100%    Physical Exam: General appearance: Awake, alert in no apparent distress Cardiac: Heart rate and rhythm are regular Respirations: Nonlabored Extremities: LLE: thigh high compression stocking in place. Calf soft. 2+ DP pulse. Popliteal bandage/bolster in place.  CBC    Component Value Date/Time   WBC 11.7 (H) 08/11/2020 0420   RBC 3.41 (L) 08/11/2020 0420   HGB 10.1 (L) 08/11/2020 0420   HCT 30.6 (L) 08/11/2020 0420   PLT 357 08/11/2020 0420   MCV 89.7 08/11/2020 0420   MCH 29.6 08/11/2020 0420   MCHC 33.0 08/11/2020 0420   RDW 14.6 08/11/2020 0420   LYMPHSABS 3.2 08/11/2020 0420   MONOABS 1.4 (H) 08/11/2020 0420   EOSABS 0.3 08/11/2020 0420   BASOSABS 0.1 08/11/2020 0420    BMET    Component Value Date/Time   NA 136 08/11/2020 0420   K 3.6 08/11/2020 0420   CL 109 08/11/2020 0420   CO2 23 08/11/2020 0420   GLUCOSE 100 (H) 08/11/2020 0420   BUN <5 (L) 08/11/2020 0420   CREATININE 0.98 08/11/2020 0420   CREATININE 0.99 07/26/2020 0947   CALCIUM 7.7 (L) 08/11/2020 0420   GFRNONAA >60 08/11/2020 0420   GFRNONAA 69 01/13/2016 0818   GFRAA >60 05/27/2019 1257   GFRAA 80 01/13/2016 0818     Intake/Output Summary (Last 24 hours) at 08/11/2020 0742 Last data filed at 08/11/2020 0736 Gross per 24 hour  Intake 413.11 ml  Output 200 ml  Net 213.11 ml    HOSPITAL MEDICATIONS Scheduled Meds: . aspirin EC  81 mg Oral Daily  . atorvastatin  20 mg Oral Daily  . furosemide  20 mg Oral Daily  . mometasone-formoterol  2 puff Inhalation BID  . nicotine  14 mg Transdermal Daily  . pantoprazole  40 mg Oral Daily  . sodium chloride flush  3 mL Intravenous Q12H  . sodium chloride flush  3 mL Intravenous Q12H   Continuous  Infusions: . sodium chloride    . sodium chloride 100 mL/hr at 08/10/20 0648  . sodium chloride    . heparin 1,200 Units/hr (08/11/20 0415)   PRN Meds:.sodium chloride, sodium chloride, acetaminophen, albuterol, bisacodyl, fentaNYL (SUBLIMAZE) injection, hydrALAZINE, labetalol, labetalol, ondansetron (ZOFRAN) IV, ondansetron **OR** [DISCONTINUED] ondansetron (ZOFRAN) IV, oxyCODONE, polyethylene glycol, sodium chloride flush, sodium chloride flush  Assessment and Plan: 55 y.o. male is here with extensive left lower extremity DVT. He underwent he underwent mechanical thrombectomy and balloon angioplasty of the left external iliac vein and left common femoral veins yesterday. H and H stable with normal platelet count.  Remove bolster ? today or tomorrow.   Follow-up with [facility] in one month with venous duplex.  Recommend long-term anticoagulation given the patient's likely previous DVT and high risk of recurrence. Benefits check for DOAC already completed = $3  -DVT prophylaxis: Heparin infusion   Risa Grill, PA-C [facility] [phone] 08/11/2020  7:42 AM   I have independently interviewed and examined patient and agree with PA assessment and plan above.  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  Hopefully he can get at least 6 months of DOAC and then may be transition to daily aspirin.  He has been fitted for thigh-high compressions.  His swelling is much improved from initial admission and also since preprocedure yesterday.  I discussed the outcomes with the patient.  His bolster was removed.  He is okay for discharge from vascular standpoint and will follow up with [place] in approximately 1 month.  Piera Downs C. Donzetta Matters, MD [facility] [place] Office: [phone] Pager:  [phone]

## 2020-08-11 NOTE — Progress Notes (Signed)
D/c tele and IVs. SWAT nurse went over AVS with pt.    Lavenia Atlas, RN

## 2020-08-11 NOTE — Telephone Encounter (Signed)
[name] [name], please advise if okay to still proceed with MRI/MRCP after recent hospital D/C for DVT

## 2020-08-11 NOTE — Telephone Encounter (Signed)
Yes. We can go ahead with the MRI.  [name] [name], we need to get patient in sooner than June if at all possible. Let me know if any issues with that.

## 2020-08-11 NOTE — Telephone Encounter (Signed)
FYI Per AVS at pts ov last week, Roseanne Kaufman, NP requested BMP recheck. Pt is currently admitted in hospital. Labs can be reviewed.

## 2020-08-11 NOTE — Telephone Encounter (Signed)
[name] [name] had a cancellation on April 28 and I scheduled him.  The person at his home who answered the call will let him know

## 2020-08-11 NOTE — Discharge Summary (Addendum)
Physician Discharge Summary  [name] [name] ERX:[id number] DOB: 10/05/1965 DOA: 08/05/2020  PCP: Rosita Fire, MD  Admit date: 08/05/2020 Discharge date: 08/11/2020  Admitted From:home Disposition:  home   Recommendations for Outpatient Follow-up:  1. H/o gastritis- Please f/u Hb and FOBT as outpt as he is being started on Apixaban 2. PCP requested to please f/u Mg and K at next visit- thank you  Home Health:  none  Discharge Condition:  stable   CODE STATUS:  Full code   Diet recommendation:  Heart healthy Consultations:  Vascular surgery  Procedures/Studies: . See below   Discharge Diagnoses:  Principal Problem:   DVT (deep venous thrombosis) -Lt LL Extensive DVT Active Problems:   Anemia of chronic disease   Heme + stool   Alcoholic cirrhosis of liver with ascites (HCC)   COPD (chronic obstructive pulmonary disease) (HCC)   Hypokalemia   Hypomagnesemia   Hypoalbuminemia   Hypotension   Cigarette nicotine dependence with nicotine-induced disorder    Brief Summary: This 55 years old male with PMH significant for alcoholic liver disease (last EtOH intake was in February) Hypertension, Hyperlipidemia, Tobacco abuse, COPD presented in the ED with C/O: weight loss, loss of appetite,  regurgitation as well as worsening edema in lower extremities despite being on Lasix and Aldactone.    Lower extremity Doppler was ordered for worsening left leg swelling and demonstrated extensive lower extremity DVT.  Case was discussed with vascular surgeon Dr. Donzetta Matters who recommended transfer to [facility] for further vascular surgery evaluation.  CT venogram was done which shows extensive left lower extremity DVT beginning at the level of left external iliac vein and extending into the common femoral, superficial femoral and deep femoral veins to the level of popliteal vein.     Hospital Course:  Extensive left leg DVT involving left External iliac and common femoral vein - s/p mechanical  thrombectomy and balloon angioplasty on 4/12 by Dr Donzetta Matters - continued on Heparin infusion - per Vascular surgery today, we should switch to a DOAC- will start Apixaban today - Dc ASA (on 81 mg) per vascular surgery - advised to quit smoking and counseled on increase risk for DVT with smoking  Nicotine abuse - will prescribe Nicoderm patches - smokes 1 ppd- he feels the current dose of 14 mg is working well for him  Alcoholic cirrhosis, hypoalbuminemia  - no longer drinks since Feb 2022 - home meds include Lasix and Aldactone- may have underlying ascites- can continue this - see ECHO below- has normal EF and no diastolic failure  Hypokalemia - K replaced and is 3.6 today  Hypomagnesemia  -noted today - Mg 1.6- I have replaced this via IV r  Hypotension - BP is low (see vitals below) - does not appear dehydrated, thus can continue diuretics - hold Lisinopril and Amlodipine  H/o Gastritis with FOBT + (on 4/8) AOCD - cont Protonix that he was taking as outpt - Avoid alcohol - f/u Hb and FOBT as outpt - EGD on 3/24> Abnormal gastric mucosa of uncertain significance - Biopsy report>Mild chronic gastritis.  - Warthin-Starry stain is negative for Helicobacter pylori. - Anemia panel>  08/11/2020 11:10  Ref. Range 08/06/2020 12:20  Iron Latest Ref Range: 45 - 182 ug/dL 117  UIBC Latest Units: ug/dL 2  TIBC Latest Ref Range: 250 - 450 ug/dL 119 (L)  Saturation Ratios Latest Ref Range: 17.9 - 39.5 % 98 (H)  Ferritin Latest Ref Range: 24 - 336 ng/mL 857 (H)  Transferrin  Latest Ref Range: 180 - 329 mg/dL 85 (L)  Folate Latest Ref Range: >5.9 ng/mL 7.3  Results for [name] [name] (MRN [id number]) as of 08/11/2020 11:10  Ref. Range 08/06/2020 12:20  Vitamin B12 Latest Ref Range: 180 - 914 pg/mL 362   Diarrhea in the hospital - C diff and GI panel negative - resolved  COPD - no exacerbation- advised to stop smoking    Discharge Exam: Vitals:   08/11/20 0735 08/11/20 0838  BP: 107/86    Pulse: (!) 101   Resp: 18   Temp: 98.8 F (37.1 C)   SpO2: 100% 99%   Vitals:   08/10/20 1947 08/11/20 0400 08/11/20 0735 08/11/20 0838  BP: 118/81 109/77 107/86   Pulse: 91 85 (!) 101   Resp: 15 17 18    Temp: 98.9 F (37.2 C) 98.5 F (36.9 C) 98.8 F (37.1 C)   TempSrc: Oral Oral Oral   SpO2: 100% 100% 100% 99%  Weight:      Height:        General: Pt is alert, awake, not in acute distress Cardiovascular: RRR, S1/S2 +, no rubs, no gallops Respiratory: CTA bilaterally, no wheezing, no rhonchi Abdominal: Soft, NT, ND, bowel sounds + Extremities: no edema, no cyanosis   Discharge Instructions   Allergies as of 08/11/2020   No Known Allergies     Medication List    STOP taking these medications   amLODipine 10 MG tablet Commonly known as: NORVASC   lisinopril 10 MG tablet Commonly known as: ZESTRIL   potassium chloride 10 MEQ tablet Commonly known as: KLOR-CON     TAKE these medications   albuterol 108 (90 Base) MCG/ACT inhaler Commonly known as: Proventil HFA INHALE 2 PUFFS BY MOUTH EVERY 6 HOURS AS NEEDED FOR COUGHING, WHEEZING, OR SHORTNESS OF BREATH What changed:   how much to take  how to take this  when to take this  reasons to take this  additional instructions   Apixaban Starter Pack (10mg  and 5mg ) Commonly known as: ELIQUIS STARTER PACK Take as directed on package: start with two-5mg  tablets twice daily for 7 days. On day 8, switch to one-5mg  tablet twice daily.   atorvastatin 20 MG tablet Commonly known as: LIPITOR Take 1 tablet (20 mg total) by mouth daily. What changed: when to take this   Dulera 200-5 MCG/ACT Aero Generic drug: mometasone-formoterol Inhale 2 puffs into the lungs 2 (two) times daily. What changed:   when to take this  reasons to take this   furosemide 20 MG tablet Commonly known as: Lasix Take 1 tablet (20 mg total) by mouth daily.   nicotine 7 mg/24hr patch Commonly known as: Nicoderm CQ Place 1 patch  (7 mg total) onto the skin daily. Start after the 14 mg patches have finished   nicotine 14 mg/24hr patch Commonly known as: NICODERM CQ - dosed in mg/24 hours Place 1 patch (14 mg total) onto the skin daily. Start taking on: August 12, 2020   pantoprazole 40 MG tablet Commonly known as: PROTONIX Take 1 tablet (40 mg total) by mouth daily. Take 30 minutes   spironolactone 25 MG tablet Commonly known as: ALDACTONE Take 25 mg by mouth 2 (two) times daily.       Follow-up Information    Waynetta Sandy, MD Follow up.   Specialties: Vascular Surgery, Cardiology Why: Our office will call you with appointment time and day Contact information: [address] [place] [address] [phone]  No Known Allergies    US Abdomen Complete  Result Date: 07/15/2020 CLINICAL DATA:  Elevated LFT EXAM: ABDOMEN ULTRASOUND COMPLETE COMPARISON:  Ultrasound 03/07/2017 FINDINGS: Gallbladder: No shadowing stone. Slight gallbladder wall thickening up to 5.3 mm. No sonographic Murphy Common bile duct: Diameter: 3.5 mm Liver: Enlarged heterogeneous liver with coarse hepatic echotexture and contour nodularity suspicious for cirrhosis. No definitive focal hepatic abnormality. Portal vein is patent on color Doppler imaging with normal direction of blood flow towards the liver. IVC: No abnormality visualized. Pancreas: Visualized portion unremarkable. Spleen: Size and appearance within normal limits. Right Kidney: Length: 14 cm. Echogenicity within normal limits. No mass or hydronephrosis visualized. Left Kidney: Length: 9.5 cm. Echogenicity within normal limits. Cortical thinning and scarring. Cyst at the lower pole measuring 1.4 cm. Abdominal aorta: No aneurysm visualized. Other findings: Small amount of perihepatic ascites IMPRESSION: 1. Coarse heterogeneous liver with mild contour nodularity suspicious for cirrhosis. Trace perihepatic ascites. 2. Mild gallbladder wall thickening  without stone or sonographic Percell Miller, this is a nonspecific finding and can be seen in association with cholecystitis, liver disease, or edema forming states. 3. Cortical thinning and scarring involving left kidney. Electronically Signed   By: Donavan Foil M.D.   On: 07/15/2020 01:11   PERIPHERAL VASCULAR CATHETERIZATION  Result Date: 08/10/2020 Patient name: [name] [name] MRN: [id number] DOB: Sep 08, 1965 Sex: male 08/10/2020 Pre-operative Diagnosis: Extensive left lower extremity DVT Post-operative diagnosis:  Same Surgeon:  Erlene Quan C. Donzetta Matters, MD Procedure Performed: 1.  Ultrasound-guided cannulation left popliteal vein 2.  Left femoral, common femoral, external iliac and common iliac veins and IVC intravascular ultrasound 3.  Mechanical thrombectomy of left external iliac, left common femoral and left femoral veins with Inari clottriever 4.  Left lower extremity venography 5.  Balloon angioplasty left external iliac vein and left common femoral and femoral veins with 10 mm balloon 6.  Moderate sedation with fentanyl and Versed for 45 minutes Indications: 55 year old male presented with 1 month of swelling in his left lower extremity found to have what appeared to be acute and chronic appearing thrombus in the left lower extremity.  He also apparently has swelling in the right lower extremity but this appears more chronic.  He is indicated for venogram with intravascular ultrasound and possible intervention. Findings: The small saphenous vein did not appear to communicate to the deep system.  There were 2 femoral veins.  The femoral vein that I cannulated was very diminutive throughout the femoral course of the common femoral vein was much larger.  There was a very small channel there at the beginning there appeared to be a tunnel thrombus extending up from the common femoral vein into the external iliac vein just to the level of the hypogastric vein.  After mechanical thrombectomy there was no longer tongue there.  We  had completely open external leg vein.  There was an 8 mm lumen after balloon angioplasty of the common femoral vein where previously there was only a very tiny lumen and was subtotally occluded.  Completion venography demonstrated no reflux into collateral veins with brisk flow centrally to the IVC. Plan will be for long-term anticoagulation given patient's likely previous DVT and high risk of recurrence.  Procedure:  The patient was identified in the holding area and taken to room 8.  The patient was then placed prone on the table and prepped and draped in the usual sterile fashion.  A time out was called.  Ultrasound was used to evaluate the left small saphenous vein  this did not appear to communicate to the deep system.  I cannulated one of the femoral veins which appeared to be the smaller but more superficial.  This was done after the area was anesthetized with 1% lidocaine.  Patient was also administered moderate sedation with fentanyl and Versed his vital signs were monitored throughout the course of the case.  A micropuncture wire was placed followed by micropuncture sheath Glidewire advantage patient was given additional 6000 units of heparin.  The wire was easily traversed up to the level of the right subclavian vein.  An 8 French sheath was placed.  Intravascular ultrasound was performed from the femoral vein all the way through the IVC including the femoral, common femoral, external iliac and common iliac veins on the left as well as the IVC with the above findings.  We then exchanged for the mechanical thrombectomy sheath.  Mechanical thrombectomy was performed where we had a tongue of thrombus extending in the external leg vein we performed this all the way down through the common femoral and femoral veins as well.  Completion IVUS demonstrated improvement in channel no further acute appearing thrombus.  We then performed balloon angioplasty with 10 mm balloon of the external iliac vein as well as the  common femoral and femoral veins.  Completion IVUS demonstrated 8 mm lumen where previously was subtotally occluded in the common femoral vein.  Completion venography demonstrated no reflux into collateral veins with brisk flow centrally.  Satisfied with this the wire was removed.  A bolster was fashioned and the sheath was removed and the bolster was fashioned in place with 0 silk suture.  He tolerated procedure well without immediate complication Contrast: 30 cc Brandon C. Donzetta Matters, MD [facility] Office: [phone] Pager: [phone]   US Venous Img Lower Bilateral (DVT)  Result Date: 08/04/2020 CLINICAL DATA:  Bilateral lower extremity edema EXAM: BILATERAL LOWER EXTREMITY VENOUS DOPPLER ULTRASOUND TECHNIQUE: Gray-scale sonography with graded compression, as well as color Doppler and duplex ultrasound were performed to evaluate the lower extremity deep venous systems from the level of the common femoral vein and including the common femoral, femoral, profunda femoral, popliteal and calf veins including the posterior tibial, peroneal and gastrocnemius veins when visible. The superficial great saphenous vein was also interrogated. Spectral Doppler was utilized to evaluate flow at rest and with distal augmentation maneuvers in the common femoral, femoral and popliteal veins. COMPARISON:  None. FINDINGS: RIGHT LOWER EXTREMITY Common Femoral Vein: No evidence of thrombus. Normal compressibility, respiratory phasicity and response to augmentation. Saphenofemoral Junction: No evidence of thrombus. Normal compressibility and flow on color Doppler imaging. Profunda Femoral Vein: No evidence of thrombus. Normal compressibility and flow on color Doppler imaging. Femoral Vein: No evidence of thrombus. Normal compressibility, respiratory phasicity and response to augmentation. Popliteal Vein: No evidence of thrombus. Normal compressibility, respiratory phasicity and response to augmentation.  Calf Veins: No evidence of thrombus. Normal compressibility and flow on color Doppler imaging. Superficial Great Saphenous Vein: No evidence of thrombus. Normal compressibility. Venous Reflux:  None. Other Findings: Mildly prominent right inguinal lymph nodes noted. LEFT LOWER EXTREMITY Common Femoral Vein: There is acute appearing deep venous thrombosis in the left common femoral vein. There is no appreciable flow in this vessel. No compression or augmentation evident. Saphenofemoral Junction: No evidence of thrombus. Normal compressibility and flow on color Doppler imaging. Profunda Femoral Vein: There is acute appearing deep venous thrombosis in the profunda femoral vein. No appreciable flow in this vessel. No  compression or augmentation evident. Femoral Vein: There is acute appearing deep venous thrombosis throughout the left femoral vein without appreciable flow evident. No compression or augmentation evident. Popliteal Vein: Acute appearing deep venous thrombosis throughout the left popliteal vein without appreciable flow evident. No compression or augmentation evident. Calf Veins: Acute appearing deep venous thrombosis in the peroneal and posterior tibial veins with essentially absent flow in these vessels. Loss of compression augmentation in these vessels. Anterior tibial veins appear patent. Superficial Great Saphenous Vein: No evidence of thrombus. Normal compressibility. Venous Reflux:  None. Other Findings:  Mildly prominent left inguinal lymph nodes noted. IMPRESSION: 1. Extensive deep venous thrombosis throughout the left lower extremity venous structures. 2.  No deep venous thrombosis in the right lower extremity. 3.  Prominent inguinal lymph nodes bilaterally. These results will be called to the ordering clinician or representative by the Radiology Department at the imaging location. Electronically Signed   By: Lowella Grip III M.D.   On: 08/04/2020 15:03   ECHOCARDIOGRAM COMPLETE  Result  Date: 08/06/2020    ECHOCARDIOGRAM REPORT   Patient Name:   [name] [name] Date of Exam: 08/06/2020 Medical Rec #:  [id number]    Height:       72.0 in Accession #:    [id number]   Weight:       178.0 lb Date of Birth:  12/25/1965     BSA:          2.028 m Patient Age:    51 years     BP:           102/60 mmHg Patient Gender: M            HR:           87 bpm. Exam Location:  Inpatient Procedure: 2D Echo, Cardiac Doppler and Color Doppler Indications:    R06.02 SOB  History:        Patient has no prior history of Echocardiogram examinations.                 Risk Factors:Hypertension, Dyslipidemia and Current Smoker.  Sonographer:    Merrie Roof Referring Phys: [place]  1. Left ventricular ejection fraction, by estimation, is 60 to 65%. The left ventricle has normal function. The left ventricle has no regional wall motion abnormalities. There is mild left ventricular hypertrophy. Left ventricular diastolic parameters were normal.  2. Right ventricular systolic function is normal. The right ventricular size is normal. Tricuspid regurgitation signal is inadequate for assessing PA pressure.  3. The mitral valve is normal in structure. No evidence of mitral valve regurgitation.  4. The aortic valve was not well visualized. Aortic valve regurgitation is not visualized. Mild aortic valve sclerosis is present, with no evidence of aortic valve stenosis.  5. The inferior vena cava is normal in size with greater than 50% respiratory variability, suggesting right atrial pressure of 3 mmHg. FINDINGS  Left Ventricle: Left ventricular ejection fraction, by estimation, is 60 to 65%. The left ventricle has normal function. The left ventricle has no regional wall motion abnormalities. The left ventricular internal cavity size was normal in size. There is  mild left ventricular hypertrophy. Left ventricular diastolic parameters were normal. Right Ventricle: The right ventricular size is normal. No increase in right  ventricular wall thickness. Right ventricular systolic function is normal. Tricuspid regurgitation signal is inadequate for assessing PA pressure. Left Atrium: Left atrial size was normal in size. Right Atrium: Right atrial size was not  well visualized. Pericardium: There is no evidence of pericardial effusion. Mitral Valve: The mitral valve is normal in structure. No evidence of mitral valve regurgitation. Tricuspid Valve: The tricuspid valve is normal in structure. Tricuspid valve regurgitation is trivial. Aortic Valve: The aortic valve was not well visualized. Aortic valve regurgitation is not visualized. Mild aortic valve sclerosis is present, with no evidence of aortic valve stenosis. Aortic valve mean gradient measures 2.0 mmHg. Aortic valve peak gradient measures 3.4 mmHg. Aortic valve area, by VTI measures 3.49 cm. Pulmonic Valve: The pulmonic valve was not well visualized. Pulmonic valve regurgitation is not visualized. Aorta: The aortic root and ascending aorta are structurally normal, with no evidence of dilitation. Venous: The inferior vena cava is normal in size with greater than 50% respiratory variability, suggesting right atrial pressure of 3 mmHg. IAS/Shunts: The interatrial septum was not well visualized.  LEFT VENTRICLE PLAX 2D LVIDd:         4.30 cm  Diastology LVIDs:         2.90 cm  LV e' medial:    8.49 cm/s LV PW:         1.20 cm  LV E/e' medial:  7.7 LV IVS:        1.00 cm  LV e' lateral:   9.14 cm/s LVOT diam:     2.20 cm  LV E/e' lateral: 7.1 LV SV:         72 LV SV Index:   35 LVOT Area:     3.80 cm  RIGHT VENTRICLE TAPSE (M-mode): 1.4 cm LEFT ATRIUM           Index LA diam:      3.40 cm 1.68 cm/m LA Vol (A4C): 33.4 ml 16.47 ml/m  AORTIC VALVE AV Area (Vmax):    4.13 cm AV Area (Vmean):   3.93 cm AV Area (VTI):     3.49 cm AV Vmax:           91.60 cm/s AV Vmean:          58.100 cm/s AV VTI:            0.206 m AV Peak Grad:      3.4 mmHg AV Mean Grad:      2.0 mmHg LVOT Vmax:          99.40 cm/s LVOT Vmean:        60.000 cm/s LVOT VTI:          0.189 m LVOT/AV VTI ratio: 0.92  AORTA Ao Root diam: 3.00 cm Ao Asc diam:  3.20 cm MITRAL VALVE MV Area (PHT): 2.39 cm    SHUNTS MV Decel Time: 317 msec    Systemic VTI:  0.19 m MV E velocity: 65.20 cm/s  Systemic Diam: 2.20 cm MV A velocity: 60.20 cm/s MV E/A ratio:  1.08 Oswaldo Milian MD Electronically signed by Oswaldo Milian MD Signature Date/Time: 08/06/2020/6:54:31 PM    Final    CT VENOGRAM ABD/PEL  Result Date: 08/05/2020 CLINICAL DATA:  Left deep vein thrombosis with new symptoms. EXAM: CT VENOGRAM ABDOMEN AND PELVIS CT ABDOMEN AND PELVIS WITH CONTRAST TECHNIQUE: Multidetector CT imaging of the abdomen and pelvis was performed using the CT venogram protocol following bolus administration of intravenous contrast. Images are extended to the knees. CONTRAST:  117mL OMNIPAQUE IOHEXOL 350 MG/ML SOLN COMPARISON:  01/14/2013 FINDINGS: CT VENOGRAM: Inferior vena cava: Normal caliber and patent. Right pelvis and lower extremity: The right common iliac, external iliac, common femoral, deep femoral,  superficial femoral, and popliteal veins are patent. Note that there are prominent deep femoral vein collaterals with venous varices extending to the popliteal vein. Visualized popliteal vein appears patent. Left pelvis and lower extremity: The left common iliac vein is patent. There is expansile filling defect consistent with thrombus beginning at the level of the left external iliac vein origin and extending into the common femoral, superficial femoral, and deep femoral veins. Diameter of the distal superficial femoral vein is decreased but there are evidence of thrombosis distally with apparent patency of the popliteal vein. Other: Subcutaneous soft tissue edema demonstrated in both legs, greater on the left. No loculated collections are identified. Arterial vascular calcifications. CT ABDOMEN PELVIS: Lower chest: The lung bases are clear.  Hepatobiliary: Gallbladder is contracted with thickened edematous wall. Etiology is nonspecific. No gallstones or bile duct dilatation are demonstrated. No focal liver lesions. Portal veins are patent. Pancreas: Unremarkable. No pancreatic ductal dilatation or surrounding inflammatory changes. Spleen: Normal in size without focal abnormality. Adrenals/Urinary Tract: No adrenal gland nodules. Diffuse parenchymal atrophy and scarring involving the left kidney. Nephrograms remain symmetrical. No hydronephrosis or hydroureter. Bladder wall is thickened possibly due to under distention or cystitis. Stomach/Bowel: Stomach is normal. Diffusely fluid-filled small bowel and colon with wall thickening throughout. Mesenteric edema and stranding. Changes likely represent enterocolitis. Mesenteric vessels are patent. No evidence of obstruction. Appendix is not identified. Vascular/Lymphatic: Calcification of the aorta. No aortic aneurysm. 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 No significant lymphadenopathy. Scattered celiac axis, retroperitoneal, and mesenteric lymph nodes are not pathologically enlarged, likely reactive. Reproductive: Prostate gland is not enlarged. Other: Moderate free fluid in the abdomen and pelvis consistent with ascites. No free air. Musculoskeletal: Degenerative changes in the spine. Medullary sclerosis demonstrated in the mid/distal right femoral shaft, likely bone infarct or enchondroma. IMPRESSION: 1. Deep venous thrombosis beginning at the level of the left external iliac vein origin and extending into the common femoral, superficial femoral, and deep femoral veins to the level of the popliteal vein. 2. No deep venous thrombosis demonstrated in the inferior vena cava, right pelvic or right lower extremity veins. 3. Prominent deep femoral vein collaterals on the right with venous varices extending to the popliteal vein. 4. Subcutaneous soft tissue edema in both  legs, greater on the left. 5. Diffusely fluid-filled small bowel and colon with wall thickening and mesenteric edema likely representing enterocolitis. No evidence of obstruction. 6. Diffuse parenchymal atrophy and scarring involving the left kidney. Nephrograms remain symmetrical. 7. Bladder wall is thickened possibly due to under distention or cystitis. 8. Moderate free fluid in the abdomen and pelvis consistent with ascites. 9. 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 10. Medullary sclerosis in the mid/distal right femoral shaft, likely bone infarct or enchondroma. Aortic Atherosclerosis (ICD10-I70.0). Electronically Signed   By: Lucienne Capers M.D.   On: 08/05/2020 21:41     The results of significant diagnostics from this hospitalization (including imaging, microbiology, ancillary and laboratory) are listed below for reference.     Microbiology: Recent Results (from the past 240 hour(s))  Resp Panel by RT-PCR (Flu A&B, Covid) Nasopharyngeal Swab     Status: None   Collection Time: 08/05/20 12:39 PM   Specimen: Nasopharyngeal Swab; Nasopharyngeal(NP) swabs in vial transport medium  Result Value Ref Range Status   SARS Coronavirus 2 by RT PCR NEGATIVE NEGATIVE Final    Comment: (NOTE) SARS-CoV-2 target nucleic acids are NOT DETECTED.  The SARS-CoV-2 RNA  is generally detectable in upper respiratory specimens during the acute phase of infection. The lowest concentration of SARS-CoV-2 viral copies this assay can detect is 138 copies/mL. A negative result does not preclude SARS-Cov-2 infection and should not be used as the sole basis for treatment or other patient management decisions. A negative result may occur with  improper specimen collection/handling, submission of specimen other than nasopharyngeal swab, presence of viral mutation(s) within the areas targeted by this assay, and inadequate number of viral copies(<138 copies/mL). A negative result  must be combined with clinical observations, patient history, and epidemiological information. The expected result is Negative.  Fact Sheet for Patients:  EntrepreneurPulse.com.au  Fact Sheet for Healthcare Providers:  IncredibleEmployment.be  This test is no t yet approved or cleared by the [place] FDA and  has been authorized for detection and/or diagnosis of SARS-CoV-2 by FDA under an Emergency Use Authorization (EUA). This EUA will remain  in effect (meaning this test can be used) for the duration of the COVID-19 declaration under Section 564(b)(1) of the Act, 21 U.S.C.section 360bbb-3(b)(1), unless the authorization is terminated  or revoked sooner.       Influenza A by PCR NEGATIVE NEGATIVE Final   Influenza B by PCR NEGATIVE NEGATIVE Final    Comment: (NOTE) The Xpert Xpress SARS-CoV-2/FLU/RSV plus assay is intended as an aid in the diagnosis of influenza from Nasopharyngeal swab specimens and should not be used as a sole basis for treatment. Nasal washings and aspirates are unacceptable for Xpert Xpress SARS-CoV-2/FLU/RSV testing.  Fact Sheet for Patients: EntrepreneurPulse.com.au  Fact Sheet for Healthcare Providers: IncredibleEmployment.be  This test is not yet approved or cleared by the [place] FDA and has been authorized for detection and/or diagnosis of SARS-CoV-2 by FDA under an Emergency Use Authorization (EUA). This EUA will remain in effect (meaning this test can be used) for the duration of the COVID-19 declaration under Section 564(b)(1) of the Act, 21 U.S.C. section 360bbb-3(b)(1), unless the authorization is terminated or revoked.  Performed at [facility], [address] [place], [place] [address]   C Difficile Quick Screen w PCR reflex     Status: None   Collection Time: 08/05/20 10:52 PM   Specimen: Stool  Result Value Ref Range Status   C Diff antigen NEGATIVE  NEGATIVE Final   C Diff toxin NEGATIVE NEGATIVE Final   C Diff interpretation No C. difficile detected.  Final    Comment: Performed at [facility], [place] [address] [place], [place] [address]  Gastrointestinal Panel by PCR , Stool     Status: None   Collection Time: 08/07/20 11:35 AM   Specimen: Stool  Result Value Ref Range Status   Campylobacter species NOT DETECTED NOT DETECTED Final   Plesimonas shigelloides NOT DETECTED NOT DETECTED Final   Salmonella species NOT DETECTED NOT DETECTED Final   Yersinia enterocolitica NOT DETECTED NOT DETECTED Final   Vibrio species NOT DETECTED NOT DETECTED Final   Vibrio cholerae NOT DETECTED NOT DETECTED Final   Enteroaggregative E coli (EAEC) NOT DETECTED NOT DETECTED Final   Enteropathogenic E coli (EPEC) NOT DETECTED NOT DETECTED Final   Enterotoxigenic E coli (ETEC) NOT DETECTED NOT DETECTED Final   Shiga like toxin producing E coli (STEC) NOT DETECTED NOT DETECTED Final   Shigella/Enteroinvasive E coli (EIEC) NOT DETECTED NOT DETECTED Final   Cryptosporidium NOT DETECTED NOT DETECTED Final   Cyclospora cayetanensis NOT DETECTED NOT DETECTED Final   Entamoeba histolytica NOT DETECTED NOT DETECTED Final  Giardia lamblia NOT DETECTED NOT DETECTED Final   Adenovirus F40/41 NOT DETECTED NOT DETECTED Final   Astrovirus NOT DETECTED NOT DETECTED Final   Norovirus GI/GII NOT DETECTED NOT DETECTED Final   Rotavirus A NOT DETECTED NOT DETECTED Final   Sapovirus (I, II, IV, and V) NOT DETECTED NOT DETECTED Final    Comment: Performed at [facility], [address], [place], [place] [address]  Surgical pcr screen     Status: None   Collection Time: 08/09/20  9:00 PM   Specimen: Nasal Mucosa; Nasal Swab  Result Value Ref Range Status   MRSA, PCR NEGATIVE NEGATIVE Final   Staphylococcus aureus NEGATIVE NEGATIVE Final    Comment: (NOTE) The Xpert SA Assay (FDA approved for NASAL specimens in patients 24 years of age and older), is  one component of a comprehensive surveillance program. It is not intended to diagnose infection nor to guide or monitor treatment. Performed at [facility], [place] [address], [place], [place] [address]      Labs: BNP (last 3 results) No results for input(s): BNP in the last 8760 hours. Basic Metabolic Panel: Recent Labs  Lab 08/05/20 1648 08/06/20 0445 08/07/20 0102 08/09/20 0119 08/10/20 0246 08/11/20 0420  NA  --  137 137 137 135 136  K  --  3.8 3.5 3.3* 3.2* 3.6  CL  --  110 115* 110 107 109  CO2  --  22 17* 22 23 23   GLUCOSE  --  89 93 90 79 100*  BUN  --  <5* <5* <5* <5* <5*  CREATININE  --  1.00 1.02 1.07 0.99 0.98  CALCIUM  --  8.2* 8.0* 7.5* 7.7* 7.7*  MG 1.5*  --   --  1.2* 2.0 1.6*  PHOS  --   --   --  3.6 3.6 4.3   Liver Function Tests: Recent Labs  Lab 08/05/20 1236 08/07/20 0102 08/11/20 0420  AST 31 23 35  ALT 12 10 13   ALKPHOS 95 73 94  BILITOT 0.6 0.6 0.5  PROT 6.7 5.2* 5.6*  ALBUMIN 1.8* 1.3* 1.4*   No results for input(s): LIPASE, AMYLASE in the last 168 hours. No results for input(s): AMMONIA in the last 168 hours. CBC: Recent Labs  Lab 08/07/20 0102 08/08/20 0040 08/09/20 0626 08/10/20 0246 08/11/20 0420  WBC 9.9 9.7 10.3 11.0* 11.7*  NEUTROABS 5.1 4.9 5.7 5.6 6.6  HGB 9.6* 10.2* 10.3* 10.6* 10.1*  HCT 29.2* 30.6* 30.6* 31.7* 30.6*  MCV 91.8 90.3 90.5 91.1 89.7  PLT 393 442* 398 406* 357   Cardiac Enzymes: No results for input(s): CKTOTAL, CKMB, CKMBINDEX, TROPONINI in the last 168 hours. BNP: Invalid input(s): POCBNP CBG: Recent Labs  Lab 08/07/20 1644  GLUCAP 113*   D-Dimer No results for input(s): DDIMER in the last 72 hours. Hgb A1c No results for input(s): HGBA1C in the last 72 hours. Lipid Profile No results for input(s): CHOL, HDL, LDLCALC, TRIG, CHOLHDL, LDLDIRECT in the last 72 hours. Thyroid function studies No results for input(s): TSH, T4TOTAL, T3FREE, THYROIDAB in the last 72 hours.  Invalid  input(s): FREET3 Anemia work up No results for input(s): VITAMINB12, FOLATE, FERRITIN, TIBC, IRON, RETICCTPCT in the last 72 hours. Urinalysis    Component Value Date/Time   COLORURINE YELLOW 11/02/2013 0816   APPEARANCEUR CLOUDY (A) 11/02/2013 0816   LABSPEC 1.025 11/02/2013 0816   PHURINE 6.5 11/02/2013 0816   GLUCOSEU NEGATIVE 11/02/2013 0816   HGBUR NEGATIVE 11/02/2013 5631  BILIRUBINUR NEGATIVE 11/02/2013 0816   KETONESUR NEGATIVE 11/02/2013 0816   PROTEINUR 100 (A) 11/02/2013 0816   UROBILINOGEN 0.2 11/02/2013 0816   NITRITE POSITIVE (A) 11/02/2013 0816   LEUKOCYTESUR NEGATIVE 11/02/2013 0816   Sepsis Labs Invalid input(s): PROCALCITONIN,  WBC,  LACTICIDVEN Microbiology Recent Results (from the past 240 hour(s))  Resp Panel by RT-PCR (Flu A&B, Covid) Nasopharyngeal Swab     Status: None   Collection Time: 08/05/20 12:39 PM   Specimen: Nasopharyngeal Swab; Nasopharyngeal(NP) swabs in vial transport medium  Result Value Ref Range Status   SARS Coronavirus 2 by RT PCR NEGATIVE NEGATIVE Final    Comment: (NOTE) SARS-CoV-2 target nucleic acids are NOT DETECTED.  The SARS-CoV-2 RNA is generally detectable in upper respiratory specimens during the acute phase of infection. The lowest concentration of SARS-CoV-2 viral copies this assay can detect is 138 copies/mL. A negative result does not preclude SARS-Cov-2 infection and should not be used as the sole basis for treatment or other patient management decisions. A negative result may occur with  improper specimen collection/handling, submission of specimen other than nasopharyngeal swab, presence of viral mutation(s) within the areas targeted by this assay, and inadequate number of viral copies(<138 copies/mL). A negative result must be combined with clinical observations, patient history, and epidemiological information. The expected result is Negative.  Fact Sheet for Patients:   EntrepreneurPulse.com.au  Fact Sheet for Healthcare Providers:  IncredibleEmployment.be  This test is no t yet approved or cleared by the [place] FDA and  has been authorized for detection and/or diagnosis of SARS-CoV-2 by FDA under an Emergency Use Authorization (EUA). This EUA will remain  in effect (meaning this test can be used) for the duration of the COVID-19 declaration under Section 564(b)(1) of the Act, 21 U.S.C.section 360bbb-3(b)(1), unless the authorization is terminated  or revoked sooner.       Influenza A by PCR NEGATIVE NEGATIVE Final   Influenza B by PCR NEGATIVE NEGATIVE Final    Comment: (NOTE) The Xpert Xpress SARS-CoV-2/FLU/RSV plus assay is intended as an aid in the diagnosis of influenza from Nasopharyngeal swab specimens and should not be used as a sole basis for treatment. Nasal washings and aspirates are unacceptable for Xpert Xpress SARS-CoV-2/FLU/RSV testing.  Fact Sheet for Patients: EntrepreneurPulse.com.au  Fact Sheet for Healthcare Providers: IncredibleEmployment.be  This test is not yet approved or cleared by the [place] FDA and has been authorized for detection and/or diagnosis of SARS-CoV-2 by FDA under an Emergency Use Authorization (EUA). This EUA will remain in effect (meaning this test can be used) for the duration of the COVID-19 declaration under Section 564(b)(1) of the Act, 21 U.S.C. section 360bbb-3(b)(1), unless the authorization is terminated or revoked.  Performed at [facility], [address] [place], [place] [address]   C Difficile Quick Screen w PCR reflex     Status: None   Collection Time: 08/05/20 10:52 PM   Specimen: Stool  Result Value Ref Range Status   C Diff antigen NEGATIVE NEGATIVE Final   C Diff toxin NEGATIVE NEGATIVE Final   C Diff interpretation No C. difficile detected.  Final    Comment: Performed at [facility], [place] [address], [place], [place] [address]  Gastrointestinal Panel by PCR , Stool     Status: None   Collection Time: 08/07/20 11:35 AM   Specimen: Stool  Result Value Ref Range Status   Campylobacter species NOT DETECTED NOT DETECTED Final   Plesimonas shigelloides NOT DETECTED NOT  DETECTED Final   Salmonella species NOT DETECTED NOT DETECTED Final   Yersinia enterocolitica NOT DETECTED NOT DETECTED Final   Vibrio species NOT DETECTED NOT DETECTED Final   Vibrio cholerae NOT DETECTED NOT DETECTED Final   Enteroaggregative E coli (EAEC) NOT DETECTED NOT DETECTED Final   Enteropathogenic E coli (EPEC) NOT DETECTED NOT DETECTED Final   Enterotoxigenic E coli (ETEC) NOT DETECTED NOT DETECTED Final   Shiga like toxin producing E coli (STEC) NOT DETECTED NOT DETECTED Final   Shigella/Enteroinvasive E coli (EIEC) NOT DETECTED NOT DETECTED Final   Cryptosporidium NOT DETECTED NOT DETECTED Final   Cyclospora cayetanensis NOT DETECTED NOT DETECTED Final   Entamoeba histolytica NOT DETECTED NOT DETECTED Final   Giardia lamblia NOT DETECTED NOT DETECTED Final   Adenovirus F40/41 NOT DETECTED NOT DETECTED Final   Astrovirus NOT DETECTED NOT DETECTED Final   Norovirus GI/GII NOT DETECTED NOT DETECTED Final   Rotavirus A NOT DETECTED NOT DETECTED Final   Sapovirus (I, II, IV, and V) NOT DETECTED NOT DETECTED Final    Comment: Performed at [facility], [address] [place], [place] [address]  Surgical pcr screen     Status: None   Collection Time: 08/09/20  9:00 PM   Specimen: Nasal Mucosa; Nasal Swab  Result Value Ref Range Status   MRSA, PCR NEGATIVE NEGATIVE Final   Staphylococcus aureus NEGATIVE NEGATIVE Final    Comment: (NOTE) The Xpert SA Assay (FDA approved for NASAL specimens in patients 46 years of age and older), is one component of a comprehensive surveillance program. It is not intended to diagnose infection nor to guide or monitor treatment. Performed at [facility], [place] [address] [place],  [address]      Time coordinating discharge in minutes: 65  SIGNED:   Debbe Odea, MD  [facility] 08/11/2020, 11:29 AM

## 2020-08-12 NOTE — Telephone Encounter (Signed)
MRI scheduled for 4/27, arrival 6:30am, npo midnight  Called pt and spoke with Kathryne Sharper. Pt lost his phone. She is aware of appt details and will inform pt.

## 2020-08-22 LAB — HEMOCHROMATOSIS DNA-PCR(C282Y,H63D)

## 2020-08-24 ENCOUNTER — Ambulatory Visit: Payer: Medicaid Other | Admitting: Gastroenterology

## 2020-08-25 ENCOUNTER — Other Ambulatory Visit: Payer: Self-pay | Admitting: Gastroenterology

## 2020-08-25 ENCOUNTER — Ambulatory Visit (HOSPITAL_COMMUNITY)
Admission: RE | Admit: 2020-08-25 | Discharge: 2020-08-25 | Disposition: A | Discharge status: Home / Self Care | Payer: Medicaid Other | Source: Ambulatory Visit | Attending: Gastroenterology | Admitting: Gastroenterology

## 2020-08-25 DIAGNOSIS — R600 Localized edema: Secondary | ICD-10-CM | POA: Insufficient documentation

## 2020-08-25 DIAGNOSIS — K7030 Alcoholic cirrhosis of liver without ascites: Secondary | ICD-10-CM | POA: Insufficient documentation

## 2020-08-25 DIAGNOSIS — K760 Fatty (change of) liver, not elsewhere classified: Secondary | ICD-10-CM | POA: Diagnosis present

## 2020-08-25 IMAGING — MR MR ABDOMEN WO/W CM MRCP
22 of 25 series · 44 of 48 positions shown · IV contrast (gadavist)
Comparison: Abdominal ultrasound [DATE] and CT abdomen from
[DATE]

CLINICAL DATA: Abdominal pain with bulging, irregular, and enlarged
appearance of the ampulla on [DATE] EGD

EXAM:
MRI ABDOMEN WITHOUT AND WITH CONTRAST (INCLUDING MRCP)
TECHNIQUE: Multiplanar multisequence MR imaging of the abdomen was performed
both before and after the administration of intravenous contrast.
Heavily T2-weighted images of the biliary and pancreatic ducts were
obtained, and three-dimensional MRCP images were rendered by post
processing.
CONTRAST:  7.5mL GADAVIST GADOBUTROL 1 MMOL/ML IV SOLN

[Series 3: cor haste · coronal · 6.0mm · 1.25mm/px · 1 of 33 slices shown]
[im 1/33]
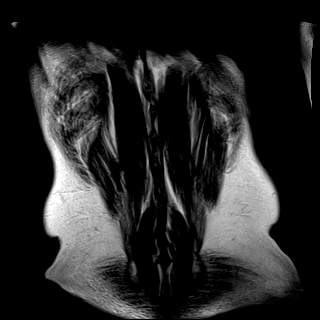

[Series 6: T2 fat-sat · axial · 6.0mm · 1.19mm/px · 1 of 6 slices shown (1 of 2)]
[im 1/6]
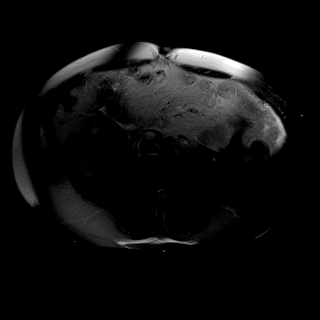

[Series 7: ax in and · axial · 3.0mm · 1.19mm/px · z∈[-130,+107]mm · 2 of 80 slices shown (1 of 2)]
[im 1/80]
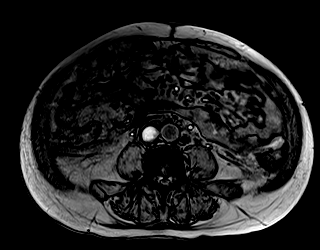
[im 80/80]
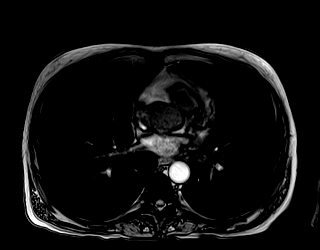

[Series 8: ax in and · axial · 3.0mm · 1.19mm/px · z∈[-130,+107]mm · 2 of 80 slices shown (2 of 2)]
[im 1/80]
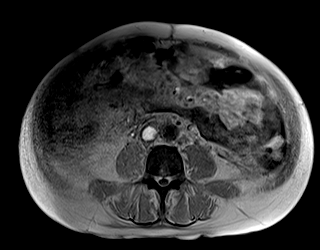
[im 80/80]
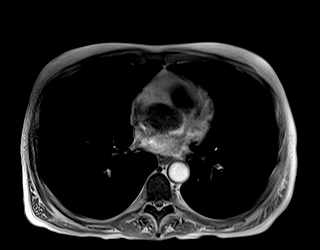

[Series 9: DWI · axial · 6.0mm · 1.42mm/px · 1 of 30 slices shown (1 of 4)]
[im 1/30]
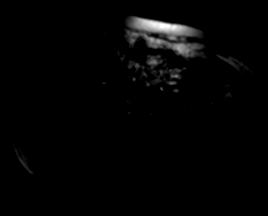

[Series 9: DWI · axial · 6.0mm · 1.42mm/px · 1 of 30 slices shown (2 of 4)]
[im 1/30]
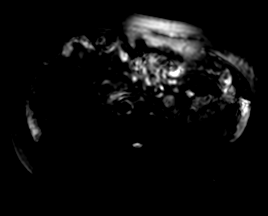

[Series 9: DWI · axial · 6.0mm · 1.42mm/px · 1 of 30 slices shown (3 of 4)]
[im 1/30]
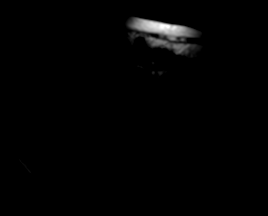

[Series 10: DWI · axial · 6.0mm · 1.42mm/px · 1 of 30 slices shown (4 of 4)]
[im 1/30]
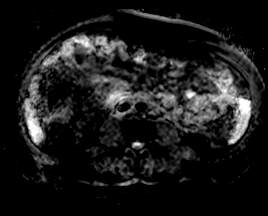

[Series 13: T2 fat-sat · axial · 6.0mm · 1.19mm/px · 1 of 36 slices shown (2 of 2)]
[im 1/36]
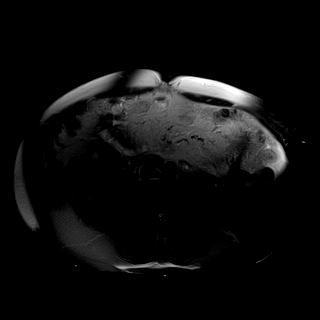

[Series 16: MRCP · coronal · 50.0mm · 0.78mm/px · 1 of 5 slices shown (1 of 2)]
[im 1/5]
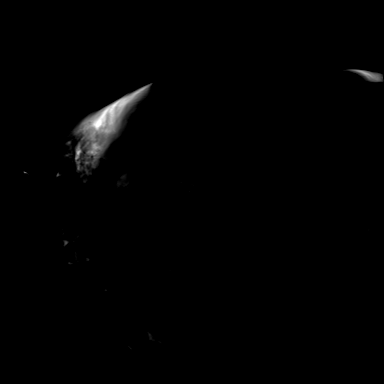

[Series 19: MRCP · coronal · 4.0mm · 1.12mm/px · 1 of 15 slices shown (2 of 2)]
[im 1/15]
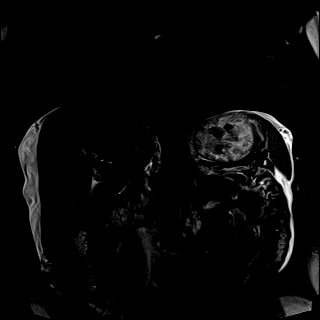

[Series 20: ax haste bh · axial · 6.0mm · 1.19mm/px · 1 of 30 slices shown]
[im 1/30]
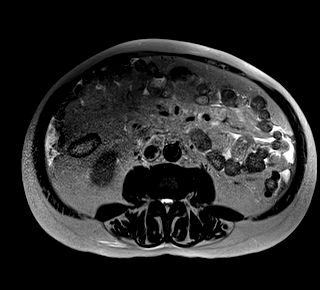

[Series 21: T1 dynamic · axial · 3.0mm · 1.19mm/px · z∈[-134,+102]mm · 3 of 80 slices shown (1 of 7)]
[im 1/80]
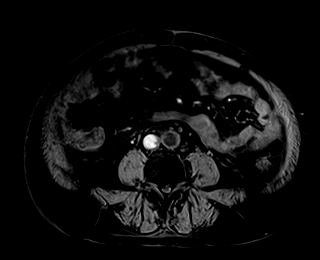
[im 40/80]
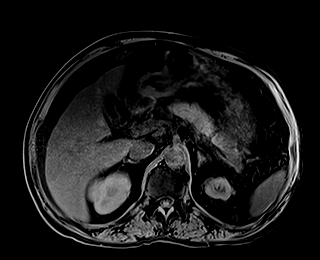
[im 80/80]
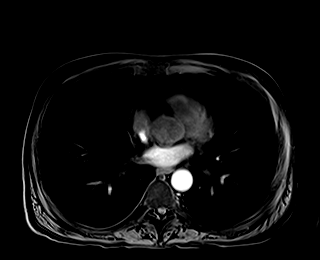

[Series 23: T1 dynamic · axial · 3.0mm · 1.19mm/px · z∈[-134,+102]mm · 3 of 80 slices shown (2 of 7)]
[im 1/80]
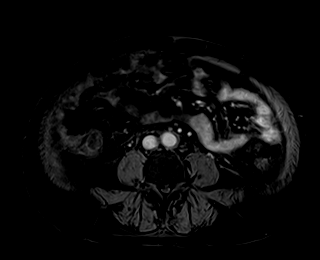
[im 40/80]
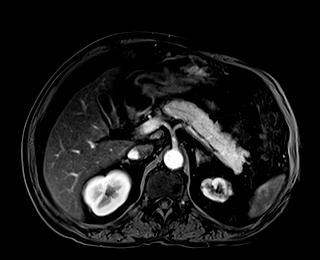
[im 80/80]
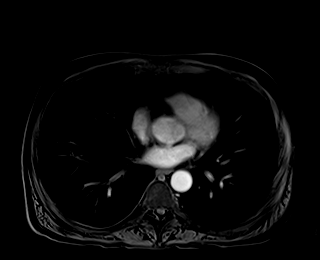

[Series 24: T1 dynamic · axial · 3.0mm · 1.19mm/px · z∈[-134,+102]mm · 3 of 80 slices shown (3 of 7)]
[im 1/80]
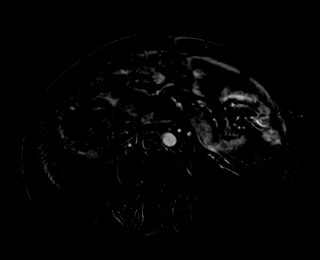
[im 40/80]
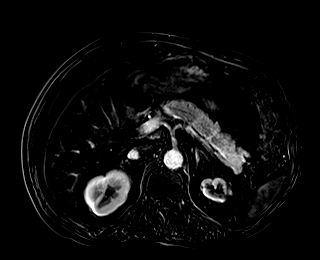
[im 80/80]
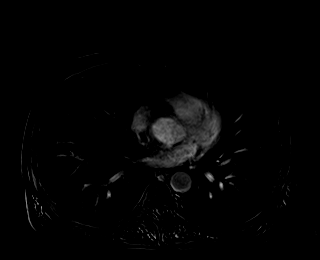

[Series 25: T1 dynamic · axial · 3.0mm · 1.19mm/px · z∈[-134,+102]mm · 3 of 80 slices shown (4 of 7)]
[im 1/80]
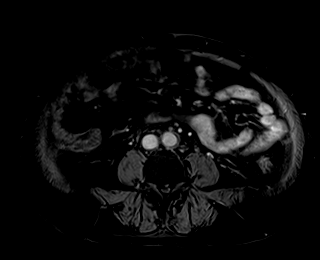
[im 40/80]
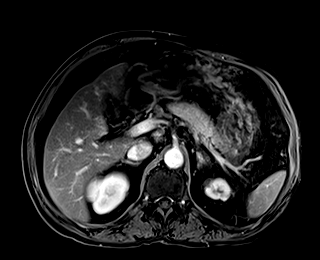
[im 80/80]
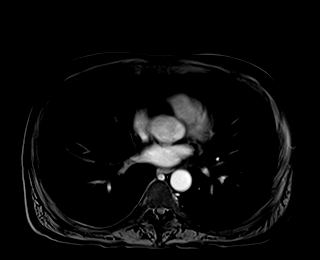

[Series 26: T1 dynamic · axial · 3.0mm · 1.19mm/px · z∈[-134,+102]mm · 3 of 80 slices shown (5 of 7)]
[im 1/80]
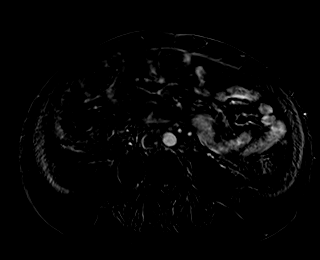
[im 40/80]
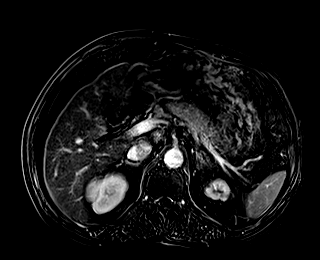
[im 80/80]
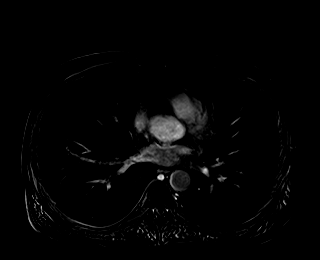

[Series 27: T1 dynamic · axial · 3.0mm · 1.19mm/px · z∈[-134,+102]mm · 3 of 80 slices shown (6 of 7)]
[im 1/80]
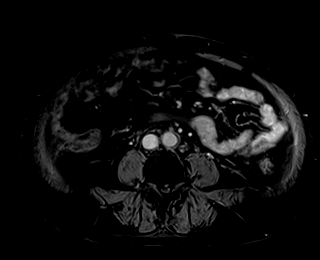
[im 40/80]
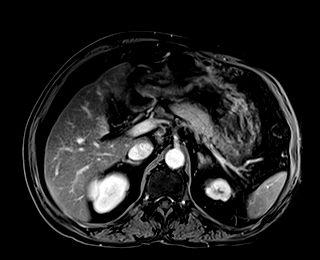
[im 80/80]
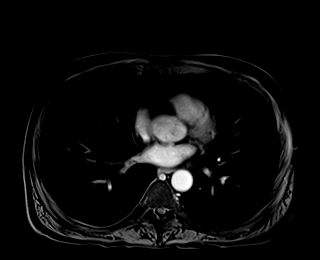

[Series 28: T1 dynamic · axial · 3.0mm · 1.19mm/px · z∈[-134,+102]mm · 3 of 80 slices shown (7 of 7)]
[im 1/80]
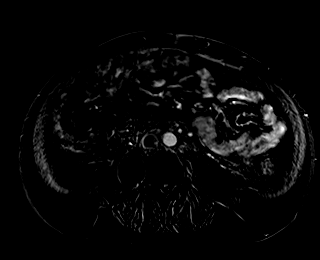
[im 40/80]
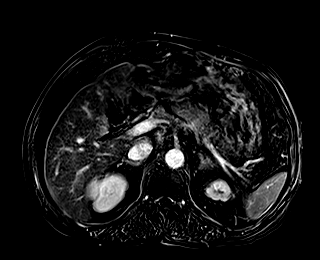
[im 80/80]
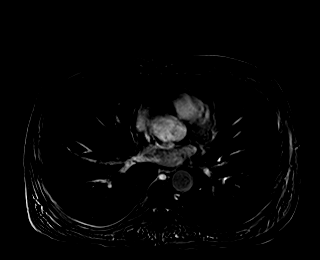

[Series 29: T1 dynamic post-contrast · coronal · 3.0mm · 1.31mm/px · 3 of 72 slices shown (1 of 3)]
[im 1/72]
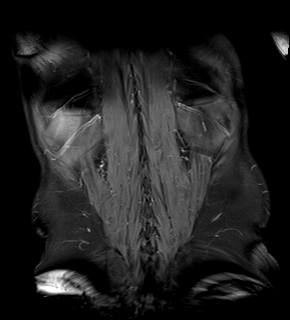
[im 36/72]
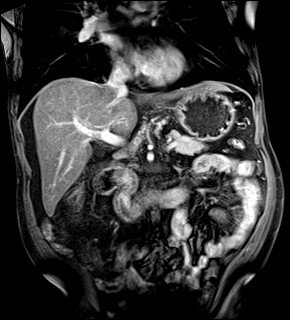
[im 72/72]
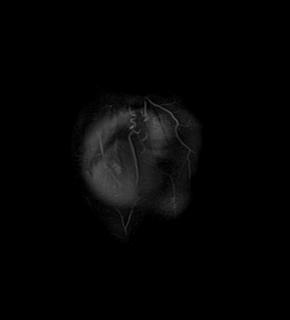

[Series 30: T1 dynamic post-contrast · axial · 3.0mm · 1.19mm/px · z∈[-134,+102]mm · 3 of 80 slices shown (2 of 3)]
[im 1/80]
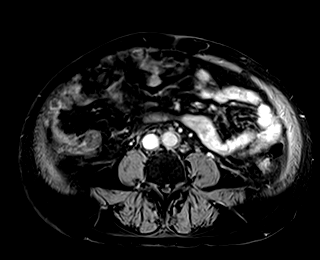
[im 40/80]
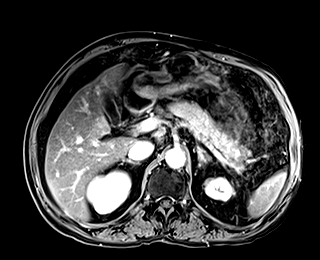
[im 80/80]
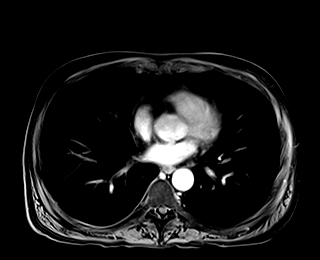

[Series 31: T1 dynamic post-contrast · axial · 3.0mm · 1.19mm/px · z∈[-134,+102]mm · 3 of 80 slices shown (3 of 3)]
[im 1/80]
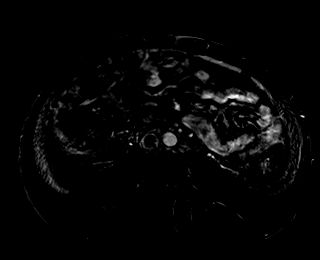
[im 40/80]
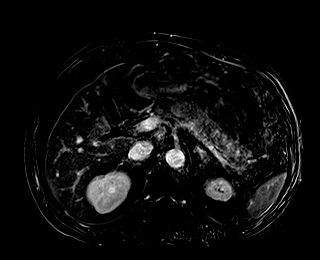
[im 80/80]
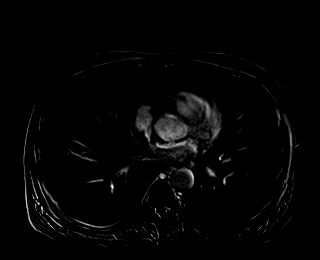

[44 of 48 positions shown; findings below may reference images not displayed]

FINDINGS: Despite efforts by the technologist and patient, motion artifact is
present on today's exam and could not be eliminated. This reduces
exam sensitivity and specificity.

Lower chest: Unremarkable

Hepatobiliary: Gallbladder wall thickening up to about 0.5 cm with
mild contraction of the gallbladder. No gallstones are identified.
Common bile duct 0.6 cm in diameter with expected normal distal
tapering and no well-defined filling defect. Diffuse hepatic
steatosis. Hepatic morphology suggests early cirrhosis. No hepatic
mass identified.

Pancreas: Pancreas divisum. Upper abdominal peripancreatic edema
although this may be related to the background mesenteric edema
rather than necessarily indicating pancreatitis. No dorsal
pancreatic duct dilatation. No pancreatic mass is identified. There
at most subtle prominence of the ampulla for example on image 55 of
series 25, but without a well-defined mass identified.

Spleen:  Unremarkable

Adrenals/Urinary Tract: Left renal atrophy. Adrenal glands
unremarkable.

Stomach/Bowel: No dilated bowel. Descending colon diverticulosis.
Scattered diverticula in the transverse colon.

Vascular/Lymphatic: Aortoiliac atherosclerotic vascular disease.
Small right gastric and retroperitoneal lymph nodes are not
pathologically enlarged by size criteria.

Other: Upper abdominal ascites. Upper abdominal mesenteric and
peripancreatic edema.

Musculoskeletal: Hemangioma in the L1 vertebral body. Degenerative
disc disease at L5-S1.
IMPRESSION: 1. There is only subtle prominence of the ampulla without a
well-defined mass identified. Given the discrepant findings between
MRI and endoscopy, a low threshold for repeat endoscopy should be
considered particularly if the patient develops any indicators of
biliary obstruction.
2. Pancreas divisum.
3. Upper abdominal ascites and mesenteric and peripancreatic edema.
Correlate with pancreatic enzyme levels and excluding pancreatitis.
4. Gallbladder wall thickening. Although probably from the patient
has known hypoalbuminemia, cholecystitis is not completely excluded.
5. Diffuse hepatic steatosis. Hepatic morphology raises the
possibility of early cirrhosis.
6.  Aortic Atherosclerosis ([5A]-[5A]).

## 2020-08-25 MED ORDER — GADOBUTROL 1 MMOL/ML IV SOLN
7.5000 mL | Freq: Once | INTRAVENOUS | Status: AC | PRN
  Administered 2020-08-25: 7.5 mL via INTRAVENOUS

## 2020-08-26 ENCOUNTER — Other Ambulatory Visit: Payer: Self-pay

## 2020-08-26 ENCOUNTER — Encounter: Payer: Self-pay | Admitting: Nurse Practitioner

## 2020-08-26 ENCOUNTER — Ambulatory Visit (INDEPENDENT_AMBULATORY_CARE_PROVIDER_SITE_OTHER): Payer: Medicaid Other | Admitting: Nurse Practitioner

## 2020-08-26 VITALS — BP 121/81 | HR 97 | Temp 97.0°F | Ht 72.0 in | Wt 170.0 lb

## 2020-08-26 DIAGNOSIS — K7030 Alcoholic cirrhosis of liver without ascites: Secondary | ICD-10-CM

## 2020-08-26 DIAGNOSIS — I82492 Acute embolism and thrombosis of other specified deep vein of left lower extremity: Secondary | ICD-10-CM | POA: Diagnosis not present

## 2020-08-26 DIAGNOSIS — Z148 Genetic carrier of other disease: Secondary | ICD-10-CM

## 2020-08-26 DIAGNOSIS — R945 Abnormal results of liver function studies: Secondary | ICD-10-CM

## 2020-08-26 DIAGNOSIS — R7989 Other specified abnormal findings of blood chemistry: Secondary | ICD-10-CM

## 2020-08-26 DIAGNOSIS — K7031 Alcoholic cirrhosis of liver with ascites: Secondary | ICD-10-CM

## 2020-08-26 LAB — COMPLETE METABOLIC PANEL WITH GFR
AG Ratio: 0.4 (calc) — ABNORMAL LOW (ref 1.0–2.5)
ALT: 11 U/L (ref 9–46)
AST: 26 U/L (ref 10–35)
Albumin: 2.1 g/dL — ABNORMAL LOW (ref 3.6–5.1)
Alkaline phosphatase (APISO): 108 U/L (ref 35–144)
BUN/Creatinine Ratio: 6 (calc) (ref 6–22)
BUN: 6 mg/dL — ABNORMAL LOW (ref 7–25)
CO2: 25 mmol/L (ref 20–32)
Calcium: 8.1 mg/dL — ABNORMAL LOW (ref 8.6–10.3)
Chloride: 103 mmol/L (ref 98–110)
Creat: 1.02 mg/dL (ref 0.70–1.33)
GFR, Est African American: 95 mL/min/{1.73_m2} (ref >=60)
GFR, Est Non African American: 82 mL/min/{1.73_m2} (ref >=60)
Globulin: 4.7 g/dL (calc) — ABNORMAL HIGH (ref 1.9–3.7)
Glucose, Bld: 84 mg/dL (ref 65–139)
Potassium: 3.6 mmol/L (ref 3.5–5.3)
Sodium: 137 mmol/L (ref 135–146)
Total Bilirubin: 0.4 mg/dL (ref 0.2–1.2)
Total Protein: 6.8 g/dL (ref 6.1–8.1)

## 2020-08-26 LAB — HEPATIC FUNCTION PANEL
AG Ratio: 0.4 (calc) — ABNORMAL LOW (ref 1.0–2.5)
ALT: 11 U/L (ref 9–46)
AST: 26 U/L (ref 10–35)
Albumin: 2.1 g/dL — ABNORMAL LOW (ref 3.6–5.1)
Alkaline phosphatase (APISO): 108 U/L (ref 35–144)
Bilirubin, Direct: 0.1 mg/dL (ref 0.0–0.2)
Globulin: 4.7 g/dL (calc) — ABNORMAL HIGH (ref 1.9–3.7)
Indirect Bilirubin: 0.3 mg/dL (calc) (ref 0.2–1.2)
Total Bilirubin: 0.4 mg/dL (ref 0.2–1.2)
Total Protein: 6.8 g/dL (ref 6.1–8.1)

## 2020-08-26 LAB — PROTIME-INR
INR: 1.3 — ABNORMAL HIGH
Prothrombin Time: 13.1 s — ABNORMAL HIGH (ref 9.0–11.5)

## 2020-08-26 LAB — IRON,TIBC AND FERRITIN PANEL
%SAT: 70 % (calc) — ABNORMAL HIGH (ref 20–48)
Ferritin: 657 ng/mL — ABNORMAL HIGH (ref 38–380)
Iron: 95 ug/dL (ref 50–180)
TIBC: 135 mcg/dL (calc) — ABNORMAL LOW (ref 250–425)

## 2020-08-26 LAB — AFP TUMOR MARKER: AFP-Tumor Marker: 2.4 ng/mL (ref <6.1)

## 2020-08-26 LAB — HEMOCHROMATOSIS DNA-PCR(C282Y,H63D)

## 2020-08-26 NOTE — Progress Notes (Signed)
Referring Provider: Rosita Fire, MD Primary Care Physician:  Rosita Fire, MD Primary GI:  Dr. Gala Romney  Chief Complaint  Patient presents with  . Hepatic Disease    F/u. Doing fine    HPI:   [name] [name] is a 55 y.o. male who presents for follow-up on liver disease.  The patient was last seen in our office [id number] for alcoholic cirrhosis, left lower extremity edema, fatty liver disease.  At that time noted history of alcoholic liver disease that is decompensated with notable lower extremity edema initially seen in March 2022.  Ultrasound liver was suspicious for cirrhosis.  Hepatitis B&C serologies negative.  Colonoscopy on file 2018 with tubular adenoma and surveillance due in 2023.  MELD Na 10.  Ferritin was markedly elevated, sats 93% and recommended hemochromatosis DNA.  Repeat EGD with normal esophagus, small hiatal hernia, abnormal gastric mucosa, abnormal appearing ampulla and periampullary mucosa overall found to be mild chronic gastritis.  His diuretics were increased the week prior to his last visit to Lasix 20 mg daily and Aldactone 50 mg daily with deemed need for recheck of BMP.  At his last visit noted chronic left lower extremity greater edema than the right with Tingling.  Notable hypoalbuminemia.  No alcohol in over a month, denies abdominal pain.  Appetite improving and using Ensure 1-2 shakes a day.  No overt GI bleeding.  Overall felt need for hemochromatosis DNA, diuretic titration.  The patient denied any added salt.  Noted hypoalbuminemia which will lead to persistent edema.  Recommended increasing Lasix to 40 mg daily, continue Aldactone 50 mg daily and check BMP in 1 week.  Recent history of hypokalemia arguing for cautious adjustment of diuretics.  Also recommended Doppler ultrasound for concerns of DVT in the left lower extremity.  Because of abnormal annual and EGD recommended MRI/MRCP.  Follow-up in 6 to 8 weeks.  Most recent CMP on 08/17/2020, normal potassium  3.6, low albumin at 2.1, normal LFTs.  Creatinine normal at 1.02.  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 Ferritin remains significantly elevated at 657, though improved compared to 1 month prior at 1,254.  Interestingly, 3 years prior was normal at 308.  Recent INR mildly elevated at 1.3.  AFP normal.  Overall felt likely need for diuretic adjustment.  Recommended referral to hematology for further evaluation of elevated ferritin in the setting of hemochromatosis heterozygote carrier.  Most recent MELD Na score 12  MRI/MRCP completed yesterday which found subtle prominence of the ampulla without a well-defined mass, low threshold for repeat endoscopy should be considered particularly if patient develops any indicators of biliary obstruction.  Noted pancreas divisum.  Upper abdominal ascites and mesenteric and peripancreatic edema with recommended pancreatic enzyme levels to exclude pancreatitis, gallbladder wall thickening likely from known hypoalbuminemia, hepatic morphology suggestive of cirrhosis.  Today he states he doing okay overall. He's doing much better, had a DVT and this is improved, swelling much better. He is now on Eliquis. No noted bleeding on anticoagulation. Denies hematochezia, melena. Denies abdominal pain, N/V, fever, chills, unintentional weight loss. Eliquis is being managed by Dr. Servando Snare in [place]. He underwent U/S guided lower extremity venous thrombectomy in the cath lab. Denies any yellowing of the skin/eyes, darkened urine, acute episodic confusion, generalized tremors, generalized pruritis. Denies URI or flu-like symptoms. Denies loss of sense of taste or smell. The patient has received COVID-19 vaccination(s). Denies chest pain, dyspnea, dizziness, lightheadedness, syncope,  near syncope. Denies any other upper or lower GI symptoms.  Currently on Lasix 20 mg daily and Aldactone 50 mg daily. He is  avoiding salt.   Past Medical History:  Diagnosis Date  . Asthma   . Dyspnea   . High cholesterol   . History of kidney stones   . Hypertension   . Kidney stones     Past Surgical History:  Procedure Laterality Date  . APPENDECTOMY    . BIOPSY  07/22/2020   Procedure: BIOPSY;  Surgeon: Daneil Dolin, MD;  [facility]: [facility];  Service: Endoscopy;;  . COLONOSCOPY WITH PROPOFOL N/A 04/18/2017   non-bleeding internal hemorrhoids, two 4-6 mm polyps in descending colon and cecum, pancolonic diverticulosis, single cecal AVM. Tubular adenomas, surveillance in 2023.   Marland Kitchen ESOPHAGOGASTRODUODENOSCOPY (EGD) WITH PROPOFOL N/A 07/22/2020   normal esophagus, small hiatal hernia, abnormal gastric mucosa, abnormal appearing ampula and periampullary mucosa. Mild chronic gastritis.  Marland Kitchen FRACTURE SURGERY     left arm  . LOWER EXTREMITY VENOGRAPHY N/A 08/10/2020   Procedure: LOWER EXTREMITY VENOGRAPHY;  Surgeon: Waynetta Sandy, MD;  Location: [place] [facility];  Service: Cardiovascular;  Laterality: N/A;  . PERIPHERAL VASCULAR BALLOON ANGIOPLASTY Left 08/10/2020   Procedure: PERIPHERAL VASCULAR BALLOON ANGIOPLASTY;  Surgeon: Waynetta Sandy, MD;  Location: [place] [facility];  Service: Cardiovascular;  Laterality: Left;  lower extremity venous  . PERIPHERAL VASCULAR THROMBECTOMY N/A 08/10/2020   Procedure: PERIPHERAL VASCULAR THROMBECTOMY;  Surgeon: Waynetta Sandy, MD;  Location: [place] [facility];  Service: Cardiovascular;  Laterality: N/A;  . POLYPECTOMY  04/18/2017   Procedure: POLYPECTOMY;  Surgeon: Daneil Dolin, MD;  Location: [facility];  Service: Endoscopy;;    Current Outpatient Medications  Medication Sig Dispense Refill  . albuterol (PROVENTIL HFA) 108 (90 Base) MCG/ACT inhaler INHALE 2 PUFFS BY MOUTH EVERY 6 HOURS AS NEEDED FOR COUGHING, WHEEZING, OR SHORTNESS OF BREATH (Patient taking differently: Inhale 1-2 puffs into the lungs every 6 (six) hours as  needed for wheezing or shortness of breath.) 20.1 g 1  . APIXABAN (ELIQUIS) VTE STARTER PACK (10MG  AND 5MG ) Take as directed on package: start with two-5mg  tablets twice daily for 7 days. On day 8, switch to one-5mg  tablet twice daily. 74 tablet 0  . atorvastatin (LIPITOR) 20 MG tablet Take 1 tablet (20 mg total) by mouth daily. (Patient taking differently: Take 20 mg by mouth in the morning.) 90 tablet 2  . furosemide (LASIX) 20 MG tablet Take 1 tablet (20 mg total) by mouth daily. 30 tablet 5  . mometasone-formoterol (DULERA) 200-5 MCG/ACT AERO Inhale 2 puffs into the lungs 2 (two) times daily. (Patient taking differently: Inhale 2 puffs into the lungs 2 (two) times daily as needed (wheezing/shortness of breath).) 39 g 3  . nicotine (NICODERM CQ - DOSED IN MG/24 HOURS) 14 mg/24hr patch Place 1 patch (14 mg total) onto the skin daily. 14 patch 0  . nicotine (NICODERM CQ) 7 mg/24hr patch Place 1 patch (7 mg total) onto the skin daily. Start after the 14 mg patches have finished 14 patch 0  . pantoprazole (PROTONIX) 40 MG tablet Take 1 tablet (40 mg total) by mouth daily. Take 30 minutes 30 tablet 3  . spironolactone (ALDACTONE) 25 MG tablet Take 25 mg by mouth 2 (two) times daily.     No current facility-administered medications for this visit.    Allergies as of 08/26/2020  . (No Known Allergies)    Family History  Problem Relation Age of Onset  . Cancer Father        throat  . Diabetes Sister   . Colon cancer Neg Hx     Social History   Socioeconomic History  . Marital status: Single    Spouse name: Not on file  . Number of children: Not on file  . Years of education: Not on file  . Highest education level: Not on file  Occupational History  . Not on file  Tobacco Use  . Smoking status: Current Every Day Smoker    Packs/day: 0.50    Years: 33.00    Pack years: 16.50    Types: Cigarettes  . Smokeless tobacco: Never Used  Vaping Use  . Vaping Use: Never used  Substance and  Sexual Activity  . 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 Drug use: No  . Sexual activity: Yes    Birth control/protection: None  Other Topics Concern  . Not on file  Social History Narrative  . Not on file   Social Determinants of Health   Financial Resource Strain: Not on file  Food Insecurity: Not on file  Transportation Needs: Not on file  Physical Activity: Not on file  Stress: Not on file  Social Connections: Not on file    Subjective: Review of Systems  Constitutional: Negative for chills, fever, malaise/fatigue and weight loss.  HENT: Negative for congestion and sore throat.   Respiratory: Negative for cough and shortness of breath.   Cardiovascular: Positive for leg swelling (improved). Negative for chest pain and palpitations.  Gastrointestinal: Negative for abdominal pain, blood in stool, diarrhea, melena, nausea and vomiting.  Musculoskeletal: Negative for joint pain and myalgias.  Skin: Negative for rash.  Neurological: Negative for dizziness and weakness.  Endo/Heme/Allergies: Does not bruise/bleed easily.  Psychiatric/Behavioral: Negative for depression. The patient is not nervous/anxious.   All other systems reviewed and are negative.    Objective: BP 121/81   Pulse 97   Temp (!) 97 F (36.1 C)   Ht 6' (1.829 m)   Wt 170 lb (77.1 kg)   BMI 23.06 kg/m  Physical Exam Vitals and nursing note reviewed.  Constitutional:      General: He is not in acute distress.    Appearance: Normal appearance. He is normal weight. He is not ill-appearing, toxic-appearing or diaphoretic.  HENT:     Head: Normocephalic and atraumatic.     Nose: No congestion or rhinorrhea.  Eyes:     General: No scleral icterus. Cardiovascular:     Rate and Rhythm: Normal rate and regular rhythm.     Heart sounds: Normal heart sounds.  Pulmonary:     Effort: Pulmonary effort is normal.      Breath sounds: Normal breath sounds.  Abdominal:     General: Bowel sounds are normal. There is no distension.     Palpations: Abdomen is soft. There is no hepatomegaly, splenomegaly or mass.     Tenderness: There is no abdominal tenderness. There is no guarding or rebound.     Hernia: No hernia is present.  Musculoskeletal:     Cervical back: Neck supple.     Right lower leg: 1+ Pitting Edema present.     Left lower leg: 1+ Pitting Edema present.  Skin:    General: Skin is warm and dry.     Coloration:  Skin is not jaundiced.     Findings: No bruising or rash.  Neurological:     General: No focal deficit present.     Mental Status: He is alert and oriented to person, place, and time. Mental status is at baseline.  Psychiatric:        Mood and Affect: Mood normal.        Behavior: Behavior normal.        Thought Content: Thought content normal.      Assessment:  Very pleasant 55 year old male who presents for follow-up on early cirrhosis, lower extremity edema, recent acute DVT on anticoagulation.  No red flag/warning signs or symptoms.  Overall he feels he is much improved.  No GI complaints today.  Cirrhosis with lower extremity edema: His edema in his left leg with a DVT is not significantly improved.  He does have bilateral 1+ pitting edema.  His diuretics have been stepwise increased and he is currently on Lasix 40 mg twice a day, Aldactone 50 mg twice a day.  I am hesitant to increase his diuretics any further at this time given adequate control.  We discussed low-salt diet and how to tell if there is salt and certain foods.  I will recheck his BMP in 2 weeks for status of his electrolytes as he has a history of hypokalemia.  Elevated ferritin: Elevated ferritin with a heterozygote hemochromatosis analysis as per labs.  He has had a significantly elevated ferritin in the past, although this could be an acute phase reactant.  Given his hemochromatosis genetic status I will refer him  to hematology as previously discussed for evaluation for anything that needs to occur related to elevated iron.  Further recommendations will follow after his evaluation with hematology  DVT: Significantly improved after hospitalization and treatment, underwent embolectomy in the Cath Lab at [facility].  He is now on chronic Eliquis with intentions for indefinite anticoagulation (per cardiology).  No adverse bleeding noted.  Recommend he continue to follow-up with cardiology and vascular based on their recommendations.   Plan: 1. Update BMP 2. Refer to hematology 3. Elevate lower extremities as much possible 4. Continue diuretics current regimen 5. Follow-up in 4 to 6 weeks for continued evaluation    Thank you for allowing [place] to participate in the care of Julian, DNP, AGNP-C Adult & Gerontological Nurse Practitioner [facility]   08/26/2020 3:37 PM   Disclaimer: This note was dictated with voice recognition software. Similar sounding words can inadvertently be transcribed and may not be corrected upon review.

## 2020-08-26 NOTE — Progress Notes (Signed)
CC'ED TO PCP 

## 2020-08-26 NOTE — Patient Instructions (Signed)
Your health issues we discussed today were:   Liver disease with continued swelling: 1. Continue your diuretics (Lasix and spironolactone) at the current doses 2. I will recheck your electrolytes in 2 weeks 3. Let us know if you have any worsening swelling 4. Continue to elevate your legs when you are sitting to help with the swelling 5. Call us for any worsening or severe symptoms  DVT (blood clot): 1. And Flagyl doing better! 2. Continue to take your blood thinners 3. Follow-up with the vein/vascular specialists based on their recommendations  Elevated iron levels: 1. As we discussed, I refer you to hematology (blood doctor) to evaluate your elevated iron 2. They can make recommendations for any treatment that needs to occur related to this 3. Further recommendations will follow  Overall I recommend:  1. Continue other current medications 2. Return for follow-up in 4 to 6 weeks with Anna 3. Call us if you have any questions or concerns   ---------------------------------------------------------------  I am glad you have gotten your COVID-19 vaccination!  Even though you are fully vaccinated you should continue to follow CDC and state/local guidelines.  ---------------------------------------------------------------   At [facility] we value your feedback. You may receive a survey about your visit today. Please share your experience as we strive to create trusting relationships with our patients to provide genuine, compassionate, quality care.  We appreciate your understanding and patience as we review any laboratory studies, imaging, and other diagnostic tests that are ordered as we care for you. Our office policy is 5 business days for review of these results, and any emergent or urgent results are addressed in a timely manner for your best interest. If you do not hear from our office in 1 week, please contact us.   We also encourage the use of MyChart, which  contains your medical information for your review as well. If you are not enrolled in this feature, an access code is on this after visit summary for your convenience. Thank you for allowing [place] to be involved in your care.  It was great to see you today!  I hope you have a great summer!!

## 2020-08-31 ENCOUNTER — Other Ambulatory Visit: Payer: Self-pay

## 2020-08-31 DIAGNOSIS — I82492 Acute embolism and thrombosis of other specified deep vein of left lower extremity: Secondary | ICD-10-CM

## 2020-08-31 LAB — BASIC METABOLIC PANEL
BUN: 7 mg/dL (ref 7–25)
CO2: 28 mmol/L (ref 20–32)
Calcium: 7.8 mg/dL — ABNORMAL LOW (ref 8.6–10.3)
Chloride: 102 mmol/L (ref 98–110)
Creat: 1.25 mg/dL (ref 0.70–1.33)
Glucose, Bld: 77 mg/dL (ref 65–99)
Potassium: 3.2 mmol/L — ABNORMAL LOW (ref 3.5–5.3)
Sodium: 137 mmol/L (ref 135–146)

## 2020-09-02 ENCOUNTER — Other Ambulatory Visit: Payer: Self-pay | Admitting: Gastroenterology

## 2020-09-02 ENCOUNTER — Other Ambulatory Visit: Payer: Self-pay

## 2020-09-02 ENCOUNTER — Telehealth: Payer: Self-pay | Admitting: Internal Medicine

## 2020-09-02 DIAGNOSIS — E876 Hypokalemia: Secondary | ICD-10-CM

## 2020-09-02 MED ORDER — POTASSIUM CHLORIDE CRYS ER 20 MEQ PO TBCR: 20.0000 meq | EXTENDED_RELEASE_TABLET | Freq: Two times a day (BID) | ORAL | 0 refills | Status: DC

## 2020-09-02 NOTE — Telephone Encounter (Signed)
[phone] patient returned call, please call back

## 2020-09-02 NOTE — Telephone Encounter (Signed)
Spoke with pt. Pt is taking Lasix 40 mg daily and Spironolactone 50 mg bid= 100 mg daily. Pt will complete labs next week and start his medication.

## 2020-09-03 ENCOUNTER — Other Ambulatory Visit: Payer: Self-pay | Admitting: Gastroenterology

## 2020-09-04 ENCOUNTER — Other Ambulatory Visit: Payer: Self-pay | Admitting: Gastroenterology

## 2020-09-05 ENCOUNTER — Other Ambulatory Visit: Payer: Self-pay | Admitting: Gastroenterology

## 2020-09-06 ENCOUNTER — Other Ambulatory Visit: Payer: Self-pay | Admitting: Gastroenterology

## 2020-09-07 ENCOUNTER — Other Ambulatory Visit: Payer: Self-pay | Admitting: Gastroenterology

## 2020-09-07 LAB — BASIC METABOLIC PANEL
BUN: 7 mg/dL (ref 7–25)
CO2: 25 mmol/L (ref 20–32)
Calcium: 7.9 mg/dL — ABNORMAL LOW (ref 8.6–10.3)
Chloride: 107 mmol/L (ref 98–110)
Creat: 1.13 mg/dL (ref 0.70–1.33)
Glucose, Bld: 76 mg/dL (ref 65–99)
Potassium: 3.5 mmol/L (ref 3.5–5.3)
Sodium: 139 mmol/L (ref 135–146)

## 2020-09-08 ENCOUNTER — Other Ambulatory Visit: Payer: Self-pay | Admitting: Gastroenterology

## 2020-09-08 ENCOUNTER — Telehealth: Payer: Self-pay | Admitting: Internal Medicine

## 2020-09-08 NOTE — Telephone Encounter (Signed)
Pt came by the office needs new Rx for Spironalactone because he ran out early due to the fact we increased how much he takes daily.

## 2020-09-08 NOTE — Telephone Encounter (Signed)
Pt came to front office window saying he is having trouble getting his prescription filled. Originally Dr Legrand Rams wrote the prescription, but he said we increased the dose and the pharmacy will not fill it without a new prescription. He uses [facility] on [address]. Spironalactone 25 mg was the bottle he showed me. Please advise. [phone]

## 2020-09-09 NOTE — Telephone Encounter (Signed)
Noted; it is not entirely clear in the reord.

## 2020-09-09 NOTE — Telephone Encounter (Signed)
Received a call from Dr. Josephine Cables office. Patient has ran out of his spironolactone due to it being increased by this office but a new Rx not sent in. Looks like this was done back in March by Vicente Males. She wanted to confirm what we have pt taking. Advised per last OV note in April by Randall Hiss he was taking Lasix 40 mg twice a day, Aldactone 50 mg twice a day. Dr. Blanch Media has sent in new Rx to the pharmacy for patient so nothing further needs to be done on our end.

## 2020-09-10 ENCOUNTER — Other Ambulatory Visit: Payer: Self-pay

## 2020-09-10 ENCOUNTER — Ambulatory Visit (INDEPENDENT_AMBULATORY_CARE_PROVIDER_SITE_OTHER)
Admission: RE | Admit: 2020-09-10 | Discharge: 2020-09-10 | Disposition: A | Discharge status: Home / Self Care | Payer: Medicaid Other | Source: Ambulatory Visit | Attending: Vascular Surgery | Admitting: Vascular Surgery

## 2020-09-10 ENCOUNTER — Ambulatory Visit (HOSPITAL_COMMUNITY)
Admission: RE | Admit: 2020-09-10 | Discharge: 2020-09-10 | Disposition: A | Discharge status: Home / Self Care | Payer: Medicaid Other | Source: Ambulatory Visit | Attending: Vascular Surgery | Admitting: Vascular Surgery

## 2020-09-10 ENCOUNTER — Ambulatory Visit (INDEPENDENT_AMBULATORY_CARE_PROVIDER_SITE_OTHER): Payer: Medicaid Other | Admitting: Physician Assistant

## 2020-09-10 VITALS — BP 147/89 | HR 94 | Temp 97.5°F | Resp 20 | Ht 72.0 in | Wt 171.4 lb

## 2020-09-10 DIAGNOSIS — I82492 Acute embolism and thrombosis of other specified deep vein of left lower extremity: Secondary | ICD-10-CM

## 2020-09-10 DIAGNOSIS — Z86718 Personal history of other venous thrombosis and embolism: Secondary | ICD-10-CM | POA: Diagnosis not present

## 2020-09-10 NOTE — Progress Notes (Signed)
Office Note     CC:  follow up Requesting Provider:  Rosita Fire, MD  HPI: [name] [name] is a 55 y.o. (Nov 23, 1965) male who presents for follow up  1.  Ultrasound-guided cannulation left popliteal vein 2.  Left femoral, common femoral, external iliac and common iliac veins and IVC intravascular ultrasound 3.  Mechanical thrombectomy of left external iliac, left common femoral and left femoral veins with Inari clottriever 4.  Left lower extremity venography 5.  Balloon angioplasty left external iliac vein and left common femoral and femoral veins with 10 mm balloon. This was performed by Dr. Donzetta Matters on 08/10/20. This was performed after presenting with 1 month of LLE swelling, with acute on chronic thrombus. He says that he has been doing well since the intervention. His symptoms are numbness and tingling and also intermittent sharp shooting pains down left leg. Overall though he says it is better. He has been wearing his thigh high compression stocking daily. He has been compliant with his Eliquis. He does report swelling of RLE. This has been present for some time, unchanged. He does elevate his legs as well but this does not really help.  The pt is on a statin for cholesterol management.  The pt is not on a daily aspirin.   Other AC: Eliquis The pt is on Diuretic for hypertension.   The pt is not diabetic.   Tobacco hx:  Current, 1/2  ppd  Past Medical History:  Diagnosis Date  . Asthma   . Dyspnea   . High cholesterol   . History of kidney stones   . Hypertension   . Kidney stones     Past Surgical History:  Procedure Laterality Date  . APPENDECTOMY    . BIOPSY  07/22/2020   Procedure: BIOPSY;  Surgeon: Daneil Dolin, MD;  Location: [facility];  Service: Endoscopy;;  . COLONOSCOPY WITH PROPOFOL N/A 04/18/2017   non-bleeding internal hemorrhoids, two 4-6 mm polyps in descending colon and cecum, pancolonic diverticulosis, single cecal AVM. Tubular adenomas, surveillance in  2023.   Marland Kitchen ESOPHAGOGASTRODUODENOSCOPY (EGD) WITH PROPOFOL N/A 07/22/2020   normal esophagus, small hiatal hernia, abnormal gastric mucosa, abnormal appearing ampula and periampullary mucosa. Mild chronic gastritis.  Marland Kitchen FRACTURE SURGERY     left arm  . LOWER EXTREMITY VENOGRAPHY N/A 08/10/2020   Procedure: LOWER EXTREMITY VENOGRAPHY;  Surgeon: Waynetta Sandy, MD;  Location: [facility];  Service: Cardiovascular;  Laterality: N/A;  . PERIPHERAL VASCULAR BALLOON ANGIOPLASTY Left 08/10/2020   Procedure: PERIPHERAL VASCULAR BALLOON ANGIOPLASTY;  Surgeon: Waynetta Sandy, MD;  Location: [facility];  Service: Cardiovascular;  Laterality: Left;  lower extremity venous  . PERIPHERAL VASCULAR THROMBECTOMY N/A 08/10/2020   Procedure: PERIPHERAL VASCULAR THROMBECTOMY;  Surgeon: Waynetta Sandy, MD;  Location: [facility];  Service: Cardiovascular;  Laterality: N/A;  . POLYPECTOMY  04/18/2017   Procedure: POLYPECTOMY;  Surgeon: Daneil Dolin, MD;  Location: [facility];  Service: Endoscopy;;    Social History   Socioeconomic History  . Marital status: Single    Spouse name: Not on file  . Number of children: Not on file  . Years of education: Not on file  . Highest education level: Not on file  Occupational History  . Not on file  Tobacco Use  . Smoking status: Current Every Day Smoker    Packs/day: 0.50    Years: 33.00    Pack years: 16.50    Types: Cigarettes  .  Smokeless tobacco: Never Used  Vaping Use  . Vaping Use: Never used  Substance and Sexual Activity  . 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 Drug use: No  . Sexual activity: Yes    Birth control/protection: None  Other Topics Concern  . Not on file  Social History Narrative  . Not on file   Social Determinants of Health   Financial Resource Strain: Not on file  Food Insecurity: Not  on file  Transportation Needs: Not on file  Physical Activity: Not on file  Stress: Not on file  Social Connections: Not on file  Intimate Partner Violence: Not on file    Family History  Problem Relation Age of Onset  . Cancer Father        throat  . Diabetes Sister   . Colon cancer Neg Hx     Current Outpatient Medications  Medication Sig Dispense Refill  . albuterol (PROVENTIL HFA) 108 (90 Base) MCG/ACT inhaler INHALE 2 PUFFS BY MOUTH EVERY 6 HOURS AS NEEDED FOR COUGHING, WHEEZING, OR SHORTNESS OF BREATH (Patient taking differently: Inhale 1-2 puffs into the lungs every 6 (six) hours as needed for wheezing or shortness of breath.) 20.1 g 1  . APIXABAN (ELIQUIS) VTE STARTER PACK (10MG  AND 5MG ) Take as directed on package: start with two-5mg  tablets twice daily for 7 days. On day 8, switch to one-5mg  tablet twice daily. 74 tablet 0  . atorvastatin (LIPITOR) 20 MG tablet Take 1 tablet (20 mg total) by mouth daily. (Patient taking differently: Take 20 mg by mouth in the morning.) 90 tablet 2  . furosemide (LASIX) 20 MG tablet Take 1 tablet (20 mg total) by mouth daily. (Patient taking differently: Take 40 mg by mouth in the morning and at bedtime.) 30 tablet 5  . mometasone-formoterol (DULERA) 200-5 MCG/ACT AERO Inhale 2 puffs into the lungs 2 (two) times daily. (Patient taking differently: Inhale 2 puffs into the lungs 2 (two) times daily as needed (wheezing/shortness of breath).) 39 g 3  . nicotine (NICODERM CQ - DOSED IN MG/24 HOURS) 14 mg/24hr patch Place 1 patch (14 mg total) onto the skin daily. 14 patch 0  . nicotine (NICODERM CQ) 7 mg/24hr patch Place 1 patch (7 mg total) onto the skin daily. Start after the 14 mg patches have finished 14 patch 0  . pantoprazole (PROTONIX) 40 MG tablet Take 1 tablet (40 mg total) by mouth daily. Take 30 minutes 30 tablet 3  . spironolactone (ALDACTONE) 25 MG tablet Take 50 mg by mouth 2 (two) times daily.    . potassium chloride SA (KLOR-CON) 20 MEQ  tablet Take 1 tablet (20 mEq total) by mouth 2 (two) times daily for 3 days. 6 tablet 0   No current facility-administered medications for this visit.    No Known Allergies   REVIEW OF SYSTEMS:  [X]  denotes positive finding, [ ]  denotes negative finding Cardiac  Comments:  Chest pain or chest pressure:    Shortness of breath upon exertion:    Short of breath when lying flat:    Irregular heart rhythm:        Vascular    Pain in calf, thigh, or hip brought on by ambulation:    Pain in feet at night that wakes you up from your sleep:     Blood clot in your veins:    Leg  swelling:         Pulmonary    Oxygen at home:    Productive cough:     Wheezing:         Neurologic    Sudden weakness in arms or legs:     Sudden numbness in arms or legs:     Sudden onset of difficulty speaking or slurred speech:    Temporary loss of vision in one eye:     Problems with dizziness:         Gastrointestinal    Blood in stool:     Vomited blood:         Genitourinary    Burning when urinating:     Blood in urine:        Psychiatric    Major depression:         Hematologic    Bleeding problems:    Problems with blood clotting too easily:        Skin    Rashes or ulcers:        Constitutional    Fever or chills:      PHYSICAL EXAMINATION:  Vitals:   09/10/20 1031  BP: (!) 147/89  Pulse: 94  Resp: 20  Temp: (!) 97.5 F (36.4 C)  TempSrc: Temporal  SpO2: 99%  Weight: 171 lb 6.4 oz (77.7 kg)  Height: 6' (1.829 m)    General:  WDWN in NAD; vital signs documented above Gait: Normal HENT: WNL, normocephalic Pulmonary: normal non-labored breathing , without Rales, rhonchi,  wheezing Cardiac: regular HR, without  Murmurs without carotid bruit Vascular Exam/Pulses:  Right Left  Radial 2+ (normal) 2+ (normal)  Femoral 2+ (normal) 2+ (normal)  Popliteal 2+ (normal) 2+ (normal)  DP 2+ (normal) 2+ (normal)  PT 2+ (normal) 2+ (normal)   Extremities: without ischemic  changes, without Gangrene , without cellulitis; without open wounds; bilateral lower extremity edema, right greater than left. Hyperpigmentation of bilateral lower extremities. Musculoskeletal: no muscle wasting or atrophy  Neurologic: A&O X 3;  No focal weakness or paresthesias are detected Psychiatric:  The pt has Normal affect.   Non-Invasive Vascular Imaging:   VAS Duplex LLE Venous (DVT): - There is no evidence of acute deep or superficial vein thrombosis in the lower extremity.  - Evidence of residual thrombus noted throughout the lower extremtiy, see above.   VAS [place] IVC/ Iliac bilat: IVC/Iliac: Suboptimal exam. The IVC appears patent, The proximal right common and external iliac veins appear patent. The segments of the left common iliac and external iliac veis visualized appear patent  ASSESSMENT/PLAN:: 55 y.o. male here for follow up for mechanical thrombectomy of left external iliac, left common femoral and left femoral veins by Dr. Donzetta Matters on 08/10/20. He is doing well post intervention. His left leg swelling is greatly improved. His has been compliant with compression and Eliquis. He will need to continue his Eliquis indefinitely. Encouraged him to elevate his bilateral lower extremities. I discussed with him that he would benefit from bilateral compression but he does not have finances at this time to get compression stockings but he may call to purchase some from our office in the future. He otherwise is doing well and will follow up in 1 year with LLE venous duplex and IVC/ bilat iliac duplex   Karoline Caldwell, PA-C [facility] [phone]  Clinic MD:  Donzetta Matters

## 2020-09-11 NOTE — Progress Notes (Shared)
[facility] 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 male is here because of an evaluation of his history of DVT of the lower extremity, at the request of [facility].  Today he reports feeling  MEDICAL HISTORY:  Past Medical History:  Diagnosis Date  . Asthma   . Dyspnea   . High cholesterol   . History of kidney stones   . Hypertension   . Kidney stones     SURGICAL HISTORY: Past Surgical History:  Procedure Laterality Date  . APPENDECTOMY    . BIOPSY  07/22/2020   Procedure: BIOPSY;  Surgeon: Corbin Ade, MD;  [facility];  Service: Endoscopy;;  . COLONOSCOPY WITH PROPOFOL N/A 04/18/2017   non-bleeding internal hemorrhoids, two 4-6 mm polyps in descending colon and cecum, pancolonic diverticulosis, single cecal AVM. Tubular adenomas, surveillance in 2023.   Marland Kitchen ESOPHAGOGASTRODUODENOSCOPY (EGD) WITH PROPOFOL N/A 07/22/2020   normal esophagus, small hiatal hernia, abnormal gastric mucosa, abnormal appearing ampula and periampullary mucosa. Mild chronic gastritis.  Marland Kitchen FRACTURE SURGERY     left arm  . LOWER EXTREMITY VENOGRAPHY N/A 08/10/2020   Procedure: LOWER EXTREMITY VENOGRAPHY;  Surgeon: Maeola Harman, MD;  Location: [facility] INVASIVE CV LAB;  Service: Cardiovascular;  Laterality: N/A;  . PERIPHERAL VASCULAR BALLOON ANGIOPLASTY Left 08/10/2020   Procedure: PERIPHERAL VASCULAR BALLOON ANGIOPLASTY;  Surgeon: Maeola Harman, MD;  Location: [facility] INVASIVE CV LAB;  Service: Cardiovascular;  Laterality: Left;  lower extremity venous  . PERIPHERAL VASCULAR THROMBECTOMY N/A 08/10/2020   Procedure: PERIPHERAL VASCULAR  THROMBECTOMY;  Surgeon: Maeola Harman, MD;  Location: [facility] INVASIVE CV LAB;  Service: Cardiovascular;  Laterality: N/A;  . POLYPECTOMY  04/18/2017   Procedure: POLYPECTOMY;  Surgeon: Corbin Ade, MD;  [facility];  Service: Endoscopy;;    SOCIAL HISTORY: Social History   Socioeconomic History  . Marital status: Single    Spouse name: Not on file  . Number of children: Not on file  . Years of education: Not on file  . Highest education level: Not on file  Occupational History  . Not on file  Tobacco Use  . Smoking status: Current Every Day Smoker    Packs/day: 0.50    Years: 33.00    Pack years: 16.50    Types: Cigarettes  . Smokeless tobacco: Never Used  Vaping Use  . Vaping Use: Never used  Substance and Sexual Activity  . 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 Drug use: No  . Sexual activity: Yes    Birth control/protection: None  Other Topics Concern  . Not on file  Social History Narrative  . Not on file   Social Determinants of Health   Financial Resource Strain: Not on file  Food Insecurity: Not on file  Transportation Needs: Not on file  Physical Activity: Not on file  Stress: Not on file  Social Connections: Not on file  Intimate Partner Violence: Not on file    FAMILY HISTORY: Family History  Problem Relation Age of Onset  . Cancer Father  throat  . Diabetes Sister   . Colon cancer Neg Hx     ALLERGIES:  has No Known Allergies.  MEDICATIONS:  Current Outpatient Medications  Medication Sig Dispense Refill  . albuterol (PROVENTIL HFA) 108 (90 Base) MCG/ACT inhaler INHALE 2 PUFFS BY MOUTH EVERY 6 HOURS AS NEEDED FOR COUGHING, WHEEZING, OR SHORTNESS OF BREATH (Patient taking differently: Inhale 1-2 puffs into the lungs every 6 (six) hours as needed for wheezing or shortness of breath.) 20.1 g 1  . APIXABAN (ELIQUIS) VTE STARTER  PACK (10MG  AND 5MG ) Take as directed on package: start with two-5mg  tablets twice daily for 7 days. On day 8, switch to one-5mg  tablet twice daily. 74 tablet 0  . atorvastatin (LIPITOR) 20 MG tablet Take 1 tablet (20 mg total) by mouth daily. (Patient taking differently: Take 20 mg by mouth in the morning.) 90 tablet 2  . furosemide (LASIX) 20 MG tablet Take 1 tablet (20 mg total) by mouth daily. (Patient taking differently: Take 40 mg by mouth in the morning and at bedtime.) 30 tablet 5  . mometasone-formoterol (DULERA) 200-5 MCG/ACT AERO Inhale 2 puffs into the lungs 2 (two) times daily. (Patient taking differently: Inhale 2 puffs into the lungs 2 (two) times daily as needed (wheezing/shortness of breath).) 39 g 3  . nicotine (NICODERM CQ - DOSED IN MG/24 HOURS) 14 mg/24hr patch Place 1 patch (14 mg total) onto the skin daily. 14 patch 0  . nicotine (NICODERM CQ) 7 mg/24hr patch Place 1 patch (7 mg total) onto the skin daily. Start after the 14 mg patches have finished 14 patch 0  . pantoprazole (PROTONIX) 40 MG tablet Take 1 tablet (40 mg total) by mouth daily. Take 30 minutes 30 tablet 3  . potassium chloride SA (KLOR-CON) 20 MEQ tablet Take 1 tablet (20 mEq total) by mouth 2 (two) times daily for 3 days. 6 tablet 0  . spironolactone (ALDACTONE) 25 MG tablet Take 50 mg by mouth 2 (two) times daily.     No current facility-administered medications for this visit.    REVIEW OF SYSTEMS:   Review of Systems  All other systems reviewed and are negative.    PHYSICAL EXAMINATION: ECOG PERFORMANCE STATUS: {CHL ONC ECOG PS:[id number]}  There were no vitals filed for this visit. There were no vitals filed for this visit. Physical Exam Vitals reviewed.  Constitutional:      Appearance: Normal appearance.  Cardiovascular:     Rate and Rhythm: Normal rate and regular rhythm.     Pulses: Normal pulses.     Heart sounds: Normal heart sounds.  Pulmonary:     Effort: Pulmonary effort is normal.      Breath sounds: Normal breath sounds.  Neurological:     General: No focal deficit present.     Mental Status: He is alert and oriented to person, place, and time.  Psychiatric:        Mood and Affect: Mood normal.        Behavior: Behavior normal.      LABORATORY DATA:  I have reviewed the data as listed Recent Results (from the past 2160 hour(s))  SARS CORONAVIRUS 2 (TAT 6-24 HRS) Nasopharyngeal Nasopharyngeal Swab     Status: None   Collection Time: 07/20/20 12:00 PM   Specimen: Nasopharyngeal Swab  Result Value Ref Range   SARS Coronavirus 2 NEGATIVE NEGATIVE    Comment: (NOTE) SARS-CoV-2 target nucleic acids are NOT DETECTED.  The SARS-CoV-2 RNA is generally detectable in upper  and lower respiratory specimens during the acute phase of infection. Negative results do not preclude SARS-CoV-2 infection, do not rule out co-infections with other pathogens, and should not be used as the sole basis for treatment or other patient management decisions. Negative results must be combined with clinical observations, patient history, and epidemiological information. The expected result is Negative.  Fact Sheet for Patients: SugarRoll.be  Fact Sheet for Healthcare Providers: https://www.woods-mathews.com/  This test is not yet approved or cleared by the [place] FDA and  has been authorized for detection and/or diagnosis of SARS-CoV-2 by FDA under an Emergency Use Authorization (EUA). This EUA will remain  in effect (meaning this test can be used) for the duration of the COVID-19 declaration under Se ction 564(b)(1) of the Act, 21 U.S.C. section 360bbb-3(b)(1), unless the authorization is terminated or revoked sooner.  Performed at [facility], [place] [address], [place], [place] [address]   Iron and TIBC     Status: Abnormal   Collection Time: 07/20/20 12:00 PM  Result Value Ref Range   Iron 137 45 - 182 ug/dL   TIBC 147  (L) 250 - 450 ug/dL   Saturation Ratios 93 (H) 17.9 - 39.5 %   UIBC 10 ug/dL    Comment: Performed at [facility], [address] [place], [place] [address]  Ferritin     Status: Abnormal   Collection Time: 07/20/20 12:00 PM  Result Value Ref Range   Ferritin 1,254 (H) 24 - 336 ng/mL    Comment: Performed at [facility], [address], [place], [place] [address]  AFP tumor marker     Status: None   Collection Time: 07/20/20 12:00 PM  Result Value Ref Range   AFP, Serum, Tumor Marker 2.0 0.0 - 8.4 ng/mL    Comment: (NOTE) Roche Diagnostics Electrochemiluminescence Immunoassay (ECLIA)                          **Please note reference interval change** Values obtained with different assay methods or kits cannot be used interchangeably.  Results cannot be interpreted as absolute evidence of the presence or absence of malignant disease. This test is not interpretable in pregnant females. Performed At: [facility] [place], [place] [id number] Rush Farmer MD [id number]   Comprehensive metabolic panel     Status: Abnormal   Collection Time: 07/20/20 12:00 PM  Result Value Ref Range   Sodium 135 135 - 145 mmol/L   Potassium 2.4 (LL) 3.5 - 5.1 mmol/L    Comment: CRITICAL RESULT CALLED TO, READ BACK BY AND VERIFIED WITH: CANADY,K. RN @1336  07/20/20 BILLINGSLEY,L    Chloride 99 98 - 111 mmol/L   CO2 25 22 - 32 mmol/L   Glucose, Bld 97 70 - 99 mg/dL    Comment: Glucose reference range applies only to samples taken after fasting for at least 8 hours.   BUN <5 (L) 6 - 20 mg/dL   Creatinine, Ser 0.93 0.61 - 1.24 mg/dL   Calcium 7.4 (L) 8.9 - 10.3 mg/dL   Total Protein 6.9 6.5 - 8.1 g/dL   Albumin 1.9 (L) 3.5 - 5.0 g/dL   AST 83 (H) 15 - 41 U/L   ALT 21 0 - 44 U/L   Alkaline Phosphatase 159 (H) 38 - 126 U/L   Total Bilirubin 1.1 0.3 - 1.2 mg/dL   GFR, Estimated >60 >60 mL/min    Comment: (NOTE) Calculated using the CKD-EPI Creatinine Equation (  2021)     Anion gap 11 5 - 15    Comment: Performed at [facility], [address] [place], [place] [address]  CBC with Differential/Platelet     Status: Abnormal   Collection Time: 07/20/20 12:00 PM  Result Value Ref Range   WBC 7.8 4.0 - 10.5 K/uL   RBC 4.15 (L) 4.22 - 5.81 MIL/uL   Hemoglobin 12.6 (L) 13.0 - 17.0 g/dL   HCT 37.6 (L) 39.0 - 52.0 %   MCV 90.6 80.0 - 100.0 fL   MCH 30.4 26.0 - 34.0 pg   MCHC 33.5 30.0 - 36.0 g/dL   RDW 14.7 11.5 - 15.5 %   Platelets 199 150 - 400 K/uL   nRBC 0.0 0.0 - 0.2 %   Neutrophils Relative % 69 %   Neutro Abs 5.3 1.7 - 7.7 K/uL   Lymphocytes Relative 21 %   Lymphs Abs 1.7 0.7 - 4.0 K/uL   Monocytes Relative 8 %   Monocytes Absolute 0.6 0.1 - 1.0 K/uL   Eosinophils Relative 1 %   Eosinophils Absolute 0.1 0.0 - 0.5 K/uL   Basophils Relative 1 %   Basophils Absolute 0.1 0.0 - 0.1 K/uL   Immature Granulocytes 0 %   Abs Immature Granulocytes 0.03 0.00 - 0.07 K/uL    Comment: Performed at [facility], [address] [place], [place] [address]  Protime-INR     Status: Abnormal   Collection Time: 07/20/20 12:00 PM  Result Value Ref Range   Prothrombin Time 15.4 (H) 11.4 - 15.2 seconds   INR 1.3 (H) 0.8 - 1.2    Comment: (NOTE) INR goal varies based on device and disease states. Performed at [name], [address], [place], [place] [address]   Carmon Ginsberg, [place] [address]     Status: Abnormal   Collection Time: 07/22/20  1:24 PM  Result Value Ref Range   Sodium 139 135 - 145 mmol/L   Potassium 3.9 3.5 - 5.1 mmol/L   Chloride 102 98 - 111 mmol/L   BUN <3 (L) 6 - 20 mg/dL   Creatinine, Ser 1.00 0.61 - 1.24 mg/dL   Glucose, Bld 91 70 - 99 mg/dL    Comment: Glucose reference range applies only to samples taken after fasting for at least 8 hours.   Calcium, Ion 1.05 (L) 1.15 - 1.40 mmol/L   TCO2 26 22 - 32 mmol/L   Hemoglobin 13.6 13.0 - 17.0 g/dL   HCT 40.0 39.0 - 52.0 %  Surgical pathology     Status: None   Collection Time: 07/22/20  2:41 PM  Result  Value Ref Range   SURGICAL PATHOLOGY      SURGICAL PATHOLOGY CASE: [id number] PATIENT: [name] [name] Surgical Pathology Report     Clinical History: Wt loss, loss of appetite, regurgitation     FINAL MICROSCOPIC DIAGNOSIS:  A. STOMACH, BIOPSY: - Mild chronic gastritis. - Warthin-Starry stain is negative for Helicobacter pylori.   GROSS DESCRIPTION:  Received in formalin are tan, soft tissue fragments that are submitted in toto. Number: 2 Size: 0.3-0.4 cm Blocks: 1 (GRP 07/23/2020)   Final Diagnosis performed by Gillie Manners, MD.   Electronically signed 07/26/2020 Technical component performed at [facility], [place] [address], [place], [place] [address].  Professional component performed at [employer]. [facility], [place] [address] [place], [place] [address].  Immunohistochemistry Technical component (if applicable) was performed at [facility]. [address], [place], [place], [place] [address].  IMMUNOHISTOCHEMISTRY DISCLAIMER (if applicable):  Some of these immunohistochemical stains may have been developed and the performance characteristics determine by [facility]. Some may not have been cleared or approved by the U.S. Food and Drug Administration. The FDA has determined that such clearance or approval is not necessary. This test is used for clinical purposes. It should not be regarded as investigational or for research. This laboratory is certified under the [place] (CLIA-88) as qualified to perform high complexity clinical laboratory testing.  The controls stained appropriately.   Basic Metabolic Panel (BMET)     Status: Abnormal   Collection Time: 07/26/20  9:47 AM  Result Value Ref Range   Glucose, Bld 83 65 - 99 mg/dL    Comment: .            Fasting reference interval .    BUN 3 (L) 7 - 25 mg/dL    Comment: Verified by repeat analysis. .     Creat 0.99 0.70 - 1.33 mg/dL    Comment: Verified by repeat analysis. . For patients >27 years of age, the reference limit for Creatinine is approximately 13% higher for people identified as African-American. .    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 This patient is negative for the C282Y pathogenic variant. 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 This result reduces the likelihood of hereditary hemochromatosis ([place]). However, it does not rule out the presence of other pathogenic variants within the HFE gene or a diagnosis of HH. The risk of this individual to carry an HFE pathogenic variant other than those tested in this assay depends greatly on family and clinical history as well as ethnicity. This assay does not test for other primary or secondary iron overload disorders.  Consider genetic counseling and DNA testing for at-risk family members. . . Laboratory results and submitted clinical information reviewed by Alethia Berthold, Ph.D., [facility], [place], [place]. . . . DE TAILED ASSAY INFORMATION: Hereditary hemochromatosis (HH) is an autosomal recessive disorder of iron metabolism that can result in iron overload and potential organ failure. It is one of the most common genetic disorders in individuals of European-Caucasian ancestry, with an estimated carrier frequency of 10%. HH is caused by pathogenic variants in  the HFE gene. Most individuals with HH (60-90%) are homozygous for the C282Y pathogenic variant. A smaller percentage of affected individuals are either compound heterozygous for the C282Y and H63D pathogenic variants (3%-8%), or homozygous for the H63D pathogenic variant (approximately 1%). . METHODOLOGY: This assay detects two pathogenic variants in the HFE gene, C282Y (NM 485462.7: c.845G>A, p.Cys282Tyr) and H63D (NM 035009.3: c.187C>G, p.His63Asp), that are commonly associated with HH. These variants are detected by multiplex-polymerase chain reaction (PCR) amplification, followed by restriction enzyme digestion and cap illary electrophoresis. Marland Kitchen LIMITATIONS: This assay does not detect  other pathogenic variants in the HFE gene that may be associated with HH. Although rare, false positive or false negative results may occur. All results should be interpreted in the context of clinical findings, relevant history, and other laboratory data. . Health care providers, please contact your local [facility] genetic counselor or call [phone] for assistance with the interpretation of these results. . This test was developed and its analytical performance characteristics have been determined by [facility]. It has not been cleared or approved by FDA. This assay has been validated pursuant to the CLIA regulations and is used for clinical purposes. . For more information, please refer to http://education.questdiagnostics.com/faq/hemochromatosis. (This link is being provided for informational/educational purposes only .) Reviewed and signed by Alethia Berthold, Ph.D., [facility], [place], CGMB, Signed on 08/22/2020 at 12:38   Comprehensive metabolic panel     Status: Abnormal   Collection Time: 08/05/20 12:36 PM  Result Value Ref Range   Sodium 136 135 - 145 mmol/L   Potassium 3.0 (L) 3.5 - 5.1 mmol/L    Chloride 105 98 - 111 mmol/L   CO2 22 22 - 32 mmol/L   Glucose, Bld 89 70 - 99 mg/dL    Comment: Glucose reference range applies only to samples taken after fasting for at least 8 hours.   BUN <5 (L) 6 - 20 mg/dL   Creatinine, Ser 1.06 0.61 - 1.24 mg/dL   Calcium 7.5 (L) 8.9 - 10.3 mg/dL   Total Protein 6.7 6.5 - 8.1 g/dL   Albumin 1.8 (L) 3.5 - 5.0 g/dL   AST 31 15 - 41 U/L   ALT 12 0 - 44 U/L   Alkaline Phosphatase 95 38 - 126 U/L   Total Bilirubin 0.6 0.3 - 1.2 mg/dL   GFR, Estimated >60 >60 mL/min    Comment: (NOTE) Calculated using the CKD-EPI Creatinine Equation (2021)    Anion gap 9 5 - 15    Comment: Performed at [facility], [address] [place], [place] [address]  CBC with Differential     Status: Abnormal   Collection Time: 08/05/20 12:36 PM  Result Value Ref Range   WBC 10.4 4.0 - 10.5 K/uL   RBC 4.06 (L) 4.22 - 5.81 MIL/uL   Hemoglobin 12.2 (L) 13.0 - 17.0 g/dL   HCT 37.1 (L) 39.0 - 52.0 %   MCV 91.4 80.0 - 100.0 fL   MCH 30.0 26.0 - 34.0 pg   MCHC 32.9 30.0 - 36.0 g/dL   RDW 14.6 11.5 - 15.5 %   Platelets 452 (H) 150 - 400 K/uL   nRBC 0.0 0.0 - 0.2 %   Neutrophils Relative % 67 %   Neutro Abs 6.9 1.7 - 7.7 K/uL   Lymphocytes Relative 19 %   Lymphs Abs 2.0 0.7 - 4.0 K/uL   Monocytes Relative 12 %   Monocytes Absolute 1.3 (H) 0.1 - 1.0 K/uL   Eosinophils Relative 1 %   Eosinophils Absolute 0.1 0.0 - 0.5 K/uL   Basophils Relative 1 %   Basophils Absolute 0.1 0.0 - 0.1 K/uL   Immature Granulocytes 0 %   Abs Immature Granulocytes 0.03 0.00 - 0.07 K/uL    Comment: Performed at [facility], [address] [place], [place] [address]  Protime-INR     Status: None   Collection Time: 08/05/20 12:36 PM  Result Value Ref Range   Prothrombin Time 14.3 11.4 - 15.2 seconds   INR 1.2 0.8 - 1.2  Comment: (NOTE) INR goal varies based on device and disease states. Performed at [facility], [address] [place], [place] [address]   Resp Panel by RT-PCR (Flu  A&B, Covid) Nasopharyngeal Swab     Status: None   Collection Time: 08/05/20 12:39 PM   Specimen: Nasopharyngeal Swab; Nasopharyngeal(NP) swabs in vial transport medium  Result Value Ref Range   SARS Coronavirus 2 by RT PCR NEGATIVE NEGATIVE    Comment: (NOTE) SARS-CoV-2 target nucleic acids are NOT DETECTED.  The SARS-CoV-2 RNA is generally detectable in upper respiratory specimens during the acute phase of infection. The lowest concentration of SARS-CoV-2 viral copies this assay can detect is 138 copies/mL. A negative result does not preclude SARS-Cov-2 infection and should not be used as the sole basis for treatment or other patient management decisions. A negative result may occur with  improper specimen collection/handling, submission of specimen other than nasopharyngeal swab, presence of viral mutation(s) within the areas targeted by this assay, and inadequate number of viral copies(<138 copies/mL). A negative result must be combined with clinical observations, patient history, and epidemiological information. The expected result is Negative.  Fact Sheet for Patients:  EntrepreneurPulse.com.au  Fact Sheet for Healthcare Providers:  IncredibleEmployment.be  This test is no t yet approved or cleared by the [place] FDA and  has been authorized for detection and/or diagnosis of SARS-CoV-2 by FDA under an Emergency Use Authorization (EUA). This EUA will remain  in effect (meaning this test can be used) for the duration of the COVID-19 declaration under Section 564(b)(1) of the Act, 21 U.S.C.section 360bbb-3(b)(1), unless the authorization is terminated  or revoked sooner.       Influenza A by PCR NEGATIVE NEGATIVE   Influenza B by PCR NEGATIVE NEGATIVE    Comment: (NOTE) The Xpert Xpress SARS-CoV-2/FLU/RSV plus assay is intended as an aid in the diagnosis of influenza from Nasopharyngeal swab specimens and should not be used as a  sole basis for treatment. Nasal washings and aspirates are unacceptable for Xpert Xpress SARS-CoV-2/FLU/RSV testing.  Fact Sheet for Patients: EntrepreneurPulse.com.au  Fact Sheet for Healthcare Providers: IncredibleEmployment.be  This test is not yet approved or cleared by the [place] FDA and has been authorized for detection and/or diagnosis of SARS-CoV-2 by FDA under an Emergency Use Authorization (EUA). This EUA will remain in effect (meaning this test can be used) for the duration of the COVID-19 declaration under Section 564(b)(1) of the Act, 21 U.S.C. section 360bbb-3(b)(1), unless the authorization is terminated or revoked.  Performed at [facility], [address] [place], [place] [address]   HIV Antibody (routine testing w rflx)     Status: None   Collection Time: 08/05/20  4:31 PM  Result Value Ref Range   HIV Screen 4th Generation wRfx Non Reactive Non Reactive    Comment: Performed at [facility], [place] [address], [place], [place] [address]  Heparin level (unfractionated)     Status: None   Collection Time: 08/05/20  4:31 PM  Result Value Ref Range   Heparin Unfractionated 0.38 0.30 - 0.70 IU/mL    Comment: (NOTE) If heparin results are below expected values, and patient dosage has  been confirmed, suggest follow up testing of antithrombin III levels. Performed at [facility], [place] [address], [place], [place] [address]   Magnesium     Status: Abnormal   Collection Time: 08/05/20  4:48 PM  Result Value Ref Range   Magnesium 1.5 (L) 1.7 - 2.4 mg/dL  Comment: Performed at [facility], [place] [address] [place], [place] [address]  Heparin level (unfractionated)     Status: Abnormal   Collection Time: 08/05/20  8:12 PM  Result Value Ref Range   Heparin Unfractionated 0.23 (L) 0.30 - 0.70 IU/mL    Comment: (NOTE) If heparin results are below expected values, and patient dosage has  been confirmed,  suggest follow up testing of antithrombin III levels. Performed at [facility], [place] [address] [place], [place] [address]   C Difficile Quick Screen w PCR reflex     Status: None   Collection Time: 08/05/20 10:52 PM   Specimen: Stool  Result Value Ref Range   C Diff antigen NEGATIVE NEGATIVE   C Diff toxin NEGATIVE NEGATIVE   C Diff interpretation No C. difficile detected.     Comment: Performed at [facility], [place] [address], [place], [place] [address]  CBC     Status: Abnormal   Collection Time: 08/06/20  4:45 AM  Result Value Ref Range   WBC 10.1 4.0 - 10.5 K/uL   RBC 3.70 (L) 4.22 - 5.81 MIL/uL   Hemoglobin 11.2 (L) 13.0 - 17.0 g/dL   HCT 33.3 (L) 39.0 - 52.0 %   MCV 90.0 80.0 - 100.0 fL   MCH 30.3 26.0 - 34.0 pg   MCHC 33.6 30.0 - 36.0 g/dL   RDW 14.5 11.5 - 15.5 %   Platelets 414 (H) 150 - 400 K/uL   nRBC 0.0 0.0 - 0.2 %    Comment: Performed at [facility], [place] [address] [place], [place] [id number]  Basic metabolic panel     Status: Abnormal   Collection Time: 08/06/20  4:45 AM  Result Value Ref Range   Sodium 137 135 - 145 mmol/L   Potassium 3.8 3.5 - 5.1 mmol/L   Chloride 110 98 - 111 mmol/L   CO2 22 22 - 32 mmol/L   Glucose, Bld 89 70 - 99 mg/dL    Comment: Glucose reference range applies only to samples taken after fasting for at least 8 hours.   BUN <5 (L) 6 - 20 mg/dL   Creatinine, Ser 1.00 0.61 - 1.24 mg/dL   Calcium 8.2 (L) 8.9 - 10.3 mg/dL   GFR, Estimated >60 >60 mL/min    Comment: (NOTE) Calculated using the CKD-EPI Creatinine Equation (2021)    Anion gap 5 5 - 15    Comment: Performed at [place] [address] [place], [place] [address]  Heparin level (unfractionated)     Status: None   Collection Time: 08/06/20  4:45 AM  Result Value Ref Range   Heparin Unfractionated 0.69 0.30 - 0.70 IU/mL    Comment: (NOTE) If heparin results are below expected values, and patient dosage has  been confirmed, suggest follow up  testing of antithrombin III levels. Performed at [facility], [place] [address] [place], [place] [address]   Heparin level (unfractionated)     Status: Abnormal   Collection Time: 08/06/20 10:32 AM  Result Value Ref Range   Heparin Unfractionated 0.92 (H) 0.30 - 0.70 IU/mL    Comment: (NOTE) If heparin results are below expected values, and patient dosage has  been confirmed, suggest follow up testing of antithrombin III levels. Performed at [facility], [place] [address] [place], [place] [address]   Occult blood card to lab, stool RN will collect     Status: Abnormal  Collection Time: 08/06/20 11:19 AM  Result Value Ref Range   Fecal Occult Bld POSITIVE (A) NEGATIVE    Comment: Performed at [facility], [place] [address], [place], [place] [address]  Iron and TIBC     Status: Abnormal   Collection Time: 08/06/20 12:20 PM  Result Value Ref Range   Iron 117 45 - 182 ug/dL   TIBC 119 (L) 250 - 450 ug/dL   Saturation Ratios 98 (H) 17.9 - 39.5 %   UIBC 2 ug/dL    Comment: Performed at [place] [address] [place], [place] [address]  Ferritin     Status: Abnormal   Collection Time: 08/06/20 12:20 PM  Result Value Ref Range   Ferritin 857 (H) 24 - 336 ng/mL    Comment: Performed at [facility], [place] [address] [place], [place] [address]  Transferrin     Status: Abnormal   Collection Time: 08/06/20 12:20 PM  Result Value Ref Range   Transferrin 85 (L) 180 - 329 mg/dL    Comment: Performed at [place] [address] [place], [place] [address]  Reticulocytes     Status: Abnormal   Collection Time: 08/06/20 12:20 PM  Result Value Ref Range   Retic Ct Pct 1.2 0.4 - 3.1 %   RBC. 3.74 (L) 4.22 - 5.81 MIL/uL   Retic Count, Absolute 43.0 19.0 - 186.0 K/uL   Immature Retic Fract 9.3 2.3 - 15.9 %    Comment: Performed at [place] [address] [place], [place] [address]  Vitamin B12     Status: None   Collection Time: 08/06/20  12:20 PM  Result Value Ref Range   Vitamin B-12 362 180 - 914 pg/mL    Comment: (NOTE) This assay is not validated for testing neonatal or myeloproliferative syndrome specimens for Vitamin B12 levels. Performed at [facility], [place] [address] [place], [place] [address]   Folate, serum, performed at [facility]     Status: None   Collection Time: 08/06/20 12:20 PM  Result Value Ref Range   Folate 7.3 >5.9 ng/mL    Comment: Performed at [facility], [place] [address], [place], [place] [address]  ECHOCARDIOGRAM COMPLETE     Status: None   Collection Time: 08/06/20  3:09 PM  Result Value Ref Range   Weight 2,848 oz   Height 72 in   BP 102/60 mmHg   S' Lateral 2.90 cm   AR max vel 4.13 cm2   AV Area VTI 3.49 cm2   AV Mean grad 2.0 mmHg   AV Peak grad 3.4 mmHg   Ao pk vel 0.92 m/s   Area-P 1/2 2.39 cm2   AV Area mean vel 3.93 cm2  Heparin level (unfractionated)     Status: None   Collection Time: 08/06/20  7:30 PM  Result Value Ref Range   Heparin Unfractionated 0.58 0.30 - 0.70 IU/mL    Comment: (NOTE) If heparin results are below expected values, and patient dosage has  been confirmed, suggest follow up testing of antithrombin III levels. Performed at [facility], [place] [address] [place], [place] [address]   Heparin level (unfractionated)     Status: None   Collection Time: 08/07/20  1:02 AM  Result Value Ref Range   Heparin Unfractionated 0.53 0.30 - 0.70 IU/mL    Comment: (NOTE) If heparin results are below expected values, and patient  dosage has  been confirmed, suggest follow up testing of antithrombin III levels. Performed at [facility], [place] [address], [place], [place] [address]   CBC with Differential/Platelet     Status: Abnormal   Collection Time: 08/07/20  1:02 AM  Result Value Ref Range   WBC 9.9 4.0 - 10.5 K/uL   RBC 3.18 (L) 4.22 - 5.81 MIL/uL   Hemoglobin 9.6 (L) 13.0 - 17.0 g/dL   HCT 29.2 (L) 39.0 - 52.0 %   MCV 91.8  80.0 - 100.0 fL   MCH 30.2 26.0 - 34.0 pg   MCHC 32.9 30.0 - 36.0 g/dL   RDW 14.3 11.5 - 15.5 %   Platelets 393 150 - 400 K/uL   nRBC 0.0 0.0 - 0.2 %   Neutrophils Relative % 51 %   Neutro Abs 5.1 1.7 - 7.7 K/uL   Lymphocytes Relative 32 %   Lymphs Abs 3.2 0.7 - 4.0 K/uL   Monocytes Relative 13 %   Monocytes Absolute 1.3 (H) 0.1 - 1.0 K/uL   Eosinophils Relative 3 %   Eosinophils Absolute 0.3 0.0 - 0.5 K/uL   Basophils Relative 1 %   Basophils Absolute 0.1 0.0 - 0.1 K/uL   Immature Granulocytes 0 %   Abs Immature Granulocytes 0.02 0.00 - 0.07 K/uL    Comment: Performed at [facility], [place] [address] [place], [place] [address]  Comprehensive metabolic panel     Status: Abnormal   Collection Time: 08/07/20  1:02 AM  Result Value Ref Range   Sodium 137 135 - 145 mmol/L   Potassium 3.5 3.5 - 5.1 mmol/L   Chloride 115 (H) 98 - 111 mmol/L   CO2 17 (L) 22 - 32 mmol/L   Glucose, Bld 93 70 - 99 mg/dL    Comment: Glucose reference range applies only to samples taken after fasting for at least 8 hours.   BUN <5 (L) 6 - 20 mg/dL   Creatinine, Ser 1.02 0.61 - 1.24 mg/dL   Calcium 8.0 (L) 8.9 - 10.3 mg/dL   Total Protein 5.2 (L) 6.5 - 8.1 g/dL   Albumin 1.3 (L) 3.5 - 5.0 g/dL   AST 23 15 - 41 U/L   ALT 10 0 - 44 U/L   Alkaline Phosphatase 73 38 - 126 U/L   Total Bilirubin 0.6 0.3 - 1.2 mg/dL   GFR, Estimated >60 >60 mL/min    Comment: (NOTE) Calculated using the CKD-EPI Creatinine Equation (2021)    Anion gap 5 5 - 15    Comment: Performed at [facility], [place] [address] [place], [place] [address]  Gastrointestinal Panel by PCR , Stool     Status: None   Collection Time: 08/07/20 11:35 AM   Specimen: Stool  Result Value Ref Range   Campylobacter species NOT DETECTED NOT DETECTED   Plesimonas shigelloides NOT DETECTED NOT DETECTED   Salmonella species NOT DETECTED NOT DETECTED   Yersinia enterocolitica NOT DETECTED NOT DETECTED   Vibrio species NOT DETECTED NOT  DETECTED   Vibrio cholerae NOT DETECTED NOT DETECTED   Enteroaggregative E coli (EAEC) NOT DETECTED NOT DETECTED   Enteropathogenic E coli (EPEC) NOT DETECTED NOT DETECTED   Enterotoxigenic E coli (ETEC) NOT DETECTED NOT DETECTED   Shiga like toxin producing E coli (STEC) NOT DETECTED NOT DETECTED   Shigella/Enteroinvasive E coli (EIEC) NOT DETECTED NOT DETECTED   Cryptosporidium NOT DETECTED NOT DETECTED   Cyclospora cayetanensis NOT DETECTED NOT DETECTED  Entamoeba histolytica NOT DETECTED NOT DETECTED   Giardia lamblia NOT DETECTED NOT DETECTED   Adenovirus F40/41 NOT DETECTED NOT DETECTED   Astrovirus NOT DETECTED NOT DETECTED   Norovirus GI/GII NOT DETECTED NOT DETECTED   Rotavirus A NOT DETECTED NOT DETECTED   Sapovirus (I, II, IV, and V) NOT DETECTED NOT DETECTED    Comment: Performed at [facility], [address] [place], [place] [address]  Glucose, capillary     Status: Abnormal   Collection Time: 08/07/20  4:44 PM  Result Value Ref Range   Glucose-Capillary 113 (H) 70 - 99 mg/dL    Comment: Glucose reference range applies only to samples taken after fasting for at least 8 hours.  Heparin level (unfractionated)     Status: None   Collection Time: 08/08/20 12:40 AM  Result Value Ref Range   Heparin Unfractionated 0.67 0.30 - 0.70 IU/mL    Comment: (NOTE) If heparin results are below expected values, and patient dosage has  been confirmed, suggest follow up testing of antithrombin III levels. Performed at [facility], [address] [place], [place] [address]   CBC with Differential/Platelet     Status: Abnormal   Collection Time: 08/08/20 12:40 AM  Result Value Ref Range   WBC 9.7 4.0 - 10.5 K/uL   RBC 3.39 (L) 4.22 - 5.81 MIL/uL   Hemoglobin 10.2 (L) 13.0 - 17.0 g/dL   HCT 09.830.6 (L) 11.939.0 - 14.752.0 %   MCV 90.3 80.0 - 100.0 fL   MCH 30.1 26.0 - 34.0 pg   MCHC 33.3 30.0 - 36.0 g/dL   RDW 82.914.5 56.211.5 - 13.015.5 %   Platelets 442 (H) 150 - 400 K/uL   nRBC  0.0 0.0 - 0.2 %   Neutrophils Relative % 51 %   Neutro Abs 4.9 1.7 - 7.7 K/uL   Lymphocytes Relative 33 %   Lymphs Abs 3.3 0.7 - 4.0 K/uL   Monocytes Relative 12 %   Monocytes Absolute 1.2 (H) 0.1 - 1.0 K/uL   Eosinophils Relative 3 %   Eosinophils Absolute 0.3 0.0 - 0.5 K/uL   Basophils Relative 1 %   Basophils Absolute 0.1 0.0 - 0.1 K/uL   Immature Granulocytes 0 %   Abs Immature Granulocytes 0.03 0.00 - 0.07 K/uL    Comment: Performed at [facility], [address] [place], [place] [address]  Heparin level (unfractionated)     Status: None   Collection Time: 08/09/20  1:19 AM  Result Value Ref Range   Heparin Unfractionated 0.58 0.30 - 0.70 IU/mL    Comment: (NOTE) If heparin results are below expected values, and patient dosage has  been confirmed, suggest follow up testing of antithrombin III levels. Performed at [facility], [address] [place], [place] [address]   Magnesium     Status: Abnormal   Collection Time: 08/09/20  1:19 AM  Result Value Ref Range   Magnesium 1.2 (L) 1.7 - 2.4 mg/dL    Comment: Performed at [facility], [address] [place], [place] [address]  Phosphorus     Status: None   Collection Time: 08/09/20  1:19 AM  Result Value Ref Range   Phosphorus 3.6 2.5 - 4.6 mg/dL    Comment: Performed at [facility], [address] [place], [place] [address]  Basic metabolic panel     Status: Abnormal   Collection Time: 08/09/20  1:19 AM  Result Value Ref  Range   Sodium 137 135 - 145 mmol/L   Potassium 3.3 (L) 3.5 - 5.1 mmol/L   Chloride 110 98 - 111 mmol/L   CO2 22 22 - 32 mmol/L   Glucose, Bld 90 70 - 99 mg/dL    Comment: Glucose reference range applies only to samples taken after fasting for at least 8 hours.   BUN <5 (L) 6 - 20 mg/dL   Creatinine, Ser 1.07 0.61 - 1.24 mg/dL   Calcium 7.5 (L) 8.9 - 10.3 mg/dL   GFR, Estimated >60 >60 mL/min    Comment: (NOTE) Calculated using the CKD-EPI Creatinine Equation (2021)     Anion gap 5 5 - 15    Comment: Performed at [facility] [place] [address] [place], [place] [address]  CBC with Differential/Platelet     Status: Abnormal   Collection Time: 08/09/20  6:26 AM  Result Value Ref Range   WBC 10.3 4.0 - 10.5 K/uL   RBC 3.38 (L) 4.22 - 5.81 MIL/uL   Hemoglobin 10.3 (L) 13.0 - 17.0 g/dL   HCT 30.6 (L) 39.0 - 52.0 %   MCV 90.5 80.0 - 100.0 fL   MCH 30.5 26.0 - 34.0 pg   MCHC 33.7 30.0 - 36.0 g/dL   RDW 14.5 11.5 - 15.5 %   Platelets 398 150 - 400 K/uL   nRBC 0.0 0.0 - 0.2 %   Neutrophils Relative % 55 %   Neutro Abs 5.7 1.7 - 7.7 K/uL   Lymphocytes Relative 29 %   Lymphs Abs 3.0 0.7 - 4.0 K/uL   Monocytes Relative 12 %   Monocytes Absolute 1.2 (H) 0.1 - 1.0 K/uL   Eosinophils Relative 3 %   Eosinophils Absolute 0.3 0.0 - 0.5 K/uL   Basophils Relative 1 %   Basophils Absolute 0.1 0.0 - 0.1 K/uL   Immature Granulocytes 0 %   Abs Immature Granulocytes 0.04 0.00 - 0.07 K/uL    Comment: Performed at [facility], [place] [address], [place], [place] [address]  Surgical pcr screen     Status: None   Collection Time: 08/09/20  9:00 PM   Specimen: Nasal Mucosa; Nasal Swab  Result Value Ref Range   MRSA, PCR NEGATIVE NEGATIVE   Staphylococcus aureus NEGATIVE NEGATIVE    Comment: (NOTE) The Xpert SA Assay (FDA approved for NASAL specimens in patients 46 years of age and older), is one component of a comprehensive surveillance program. It is not intended to diagnose infection nor to guide or monitor treatment. Performed at [facility], [place] [address] [place], [place] [address]   Heparin level (unfractionated)     Status: None   Collection Time: 08/10/20  2:46 AM  Result Value Ref Range   Heparin Unfractionated 0.69 0.30 - 0.70 IU/mL    Comment: (NOTE) If heparin results are below expected values, and patient dosage has  been confirmed, suggest follow up testing of antithrombin III levels. Performed at [facility], [place]  [address], [place], [place] [address]   CBC with Differential/Platelet     Status: Abnormal   Collection Time: 08/10/20  2:46 AM  Result Value Ref Range   WBC 11.0 (H) 4.0 - 10.5 K/uL   RBC 3.48 (L) 4.22 - 5.81 MIL/uL   Hemoglobin 10.6 (L) 13.0 - 17.0 g/dL   HCT 31.7 (L) 39.0 - 52.0 %   MCV 91.1 80.0 - 100.0 fL   MCH 30.5 26.0 - 34.0 pg  MCHC 33.4 30.0 - 36.0 g/dL   RDW 14.4 11.5 - 15.5 %   Platelets 406 (H) 150 - 400 K/uL   nRBC 0.0 0.0 - 0.2 %   Neutrophils Relative % 51 %   Neutro Abs 5.6 1.7 - 7.7 K/uL   Lymphocytes Relative 33 %   Lymphs Abs 3.6 0.7 - 4.0 K/uL   Monocytes Relative 12 %   Monocytes Absolute 1.3 (H) 0.1 - 1.0 K/uL   Eosinophils Relative 3 %   Eosinophils Absolute 0.3 0.0 - 0.5 K/uL   Basophils Relative 1 %   Basophils Absolute 0.1 0.0 - 0.1 K/uL   Immature Granulocytes 0 %   Abs Immature Granulocytes 0.04 0.00 - 0.07 K/uL    Comment: Performed at [place] [address], [place], [place] [id number]  Basic metabolic panel     Status: Abnormal   Collection Time: 08/10/20  2:46 AM  Result Value Ref Range   Sodium 135 135 - 145 mmol/L   Potassium 3.2 (L) 3.5 - 5.1 mmol/L   Chloride 107 98 - 111 mmol/L   CO2 23 22 - 32 mmol/L   Glucose, Bld 79 70 - 99 mg/dL    Comment: Glucose reference range applies only to samples taken after fasting for at least 8 hours.   BUN <5 (L) 6 - 20 mg/dL   Creatinine, Ser 0.99 0.61 - 1.24 mg/dL   Calcium 7.7 (L) 8.9 - 10.3 mg/dL   GFR, Estimated >60 >60 mL/min    Comment: (NOTE) Calculated using the CKD-EPI Creatinine Equation (2021)    Anion gap 5 5 - 15    Comment: Performed at [place] [address], [place], [place] [address]  Magnesium     Status: None   Collection Time: 08/10/20  2:46 AM  Result Value Ref Range   Magnesium 2.0 1.7 - 2.4 mg/dL    Comment: Performed at [place] [address] [place], [place] [address]  Phosphorus     Status: None   Collection Time: 08/10/20  2:46 AM   Result Value Ref Range   Phosphorus 3.6 2.5 - 4.6 mg/dL    Comment: Performed at [place] [address], [place], [place] [address]  CBC with Differential/Platelet     Status: Abnormal   Collection Time: 08/11/20  4:20 AM  Result Value Ref Range   WBC 11.7 (H) 4.0 - 10.5 K/uL   RBC 3.41 (L) 4.22 - 5.81 MIL/uL   Hemoglobin 10.1 (L) 13.0 - 17.0 g/dL   HCT 30.6 (L) 39.0 - 52.0 %   MCV 89.7 80.0 - 100.0 fL   MCH 29.6 26.0 - 34.0 pg   MCHC 33.0 30.0 - 36.0 g/dL   RDW 14.6 11.5 - 15.5 %   Platelets 357 150 - 400 K/uL   nRBC 0.0 0.0 - 0.2 %   Neutrophils Relative % 56 %   Neutro Abs 6.6 1.7 - 7.7 K/uL   Lymphocytes Relative 27 %   Lymphs Abs 3.2 0.7 - 4.0 K/uL   Monocytes Relative 12 %   Monocytes Absolute 1.4 (H) 0.1 - 1.0 K/uL   Eosinophils Relative 3 %   Eosinophils Absolute 0.3 0.0 - 0.5 K/uL   Basophils Relative 1 %   Basophils Absolute 0.1 0.0 - 0.1 K/uL   Immature Granulocytes 1 %   Abs Immature Granulocytes 0.06 0.00 - 0.07 K/uL    Comment: Performed at [facility],  [address] [place], [place] [address]  Magnesium     Status: Abnormal   Collection Time: 08/11/20  4:20 AM  Result Value Ref Range   Magnesium 1.6 (L) 1.7 - 2.4 mg/dL    Comment: Performed at [place] [address], [place], [place] [address]  Phosphorus     Status: None   Collection Time: 08/11/20  4:20 AM  Result Value Ref Range   Phosphorus 4.3 2.5 - 4.6 mg/dL    Comment: Performed at [place] [address] [place], [place] [address]  Comprehensive metabolic panel     Status: Abnormal   Collection Time: 08/11/20  4:20 AM  Result Value Ref Range   Sodium 136 135 - 145 mmol/L   Potassium 3.6 3.5 - 5.1 mmol/L   Chloride 109 98 - 111 mmol/L   CO2 23 22 - 32 mmol/L   Glucose, Bld 100 (H) 70 - 99 mg/dL    Comment: Glucose reference range applies only to samples taken after fasting for at least 8 hours.   BUN <5 (L) 6 - 20 mg/dL   Creatinine, Ser 0.98 0.61 -  1.24 mg/dL   Calcium 7.7 (L) 8.9 - 10.3 mg/dL   Total Protein 5.6 (L) 6.5 - 8.1 g/dL   Albumin 1.4 (L) 3.5 - 5.0 g/dL   AST 35 15 - 41 U/L   ALT 13 0 - 44 U/L   Alkaline Phosphatase 94 38 - 126 U/L   Total Bilirubin 0.5 0.3 - 1.2 mg/dL   GFR, Estimated >60 >60 mL/min    Comment: (NOTE) Calculated using the CKD-EPI Creatinine Equation (2021)    Anion gap 4 (L) 5 - 15    Comment: Performed at [facility], [place] [address] [place], [place] [address]  COMPLETE METABOLIC PANEL WITH GFR     Status: Abnormal   Collection Time: 08/17/20  9:30 AM  Result Value Ref Range   Glucose, Bld 84 65 - 139 mg/dL    Comment: .        Non-fasting reference interval .    BUN 6 (L) 7 - 25 mg/dL   Creat 1.02 0.70 - 1.33 mg/dL    Comment: For patients >59 years of age, the reference limit for Creatinine is approximately 13% higher for people identified as African-American. .    GFR, Est Non African American 82 > OR = 60 mL/min/1.63m2   GFR, Est African American 95 > OR = 60 mL/min/1.41m2   BUN/Creatinine Ratio 6 6 - 22 (calc)   Sodium 137 135 - 146 mmol/L   Potassium 3.6 3.5 - 5.3 mmol/L   Chloride 103 98 - 110 mmol/L   CO2 25 20 - 32 mmol/L   Calcium 8.1 (L) 8.6 - 10.3 mg/dL   Total Protein 6.8 6.1 - 8.1 g/dL   Albumin 2.1 (L) 3.6 - 5.1 g/dL   Globulin 4.7 (H) 1.9 - 3.7 g/dL (calc)   AG Ratio 0.4 (L) 1.0 - 2.5 (calc)   Total Bilirubin 0.4 0.2 - 1.2 mg/dL   Alkaline phosphatase (APISO) 108 35 - 144 U/L   AST 26 10 - 35 U/L   ALT 11 9 - 46 U/L  Hemochromatosis DNA-PCR(c282y,h63d)     Status: None   Collection Time: 08/17/20  9:30 AM  Result Value Ref Range   DNA MUTATION ANALYSIS See Below     Comment: 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 This patient is negative for the C282Y pathogenic variant. 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 This result reduces the likelihood of hereditary hemochromatosis ([place]). However, it does not rule out the presence of other pathogenic variants within the HFE gene or a diagnosis of HH. The risk of this individual to carry an HFE pathogenic variant other than those tested in this assay depends greatly on family and clinical history as well as ethnicity. This assay does not test for other primary or secondary iron overload disorders.  Consider genetic counseling and DNA testing for at-risk family members. . . Laboratory results and submitted clinical information reviewed by Juan Quam, MD, [place], [place], CGMBS. . . . [facility] LED ASSAY INFORMATION: Hereditary hemochromatosis (HH) is an autosomal recessive disorder of iron metabolism that can result in iron overload and potential organ failure. It is one of the most common genetic disorders in individuals of European-Caucasian ancestry, with an estimated carrier frequency of 10%. HH is caused by pathogenic variants in the HFE gene. Most individuals with HH (60-90%) are homozygous for the C282Y pathogenic variant. A smaller percentage of affected individuals are either compound heterozygous for the C282Y and H63D pathogenic variants (3%-8%), or homozygous for the H63D pathogenic variant (approximately 1%). . METHODOLOGY: This assay detects two pathogenic variants in the HFE gene, C282Y (NM 557322.0: c.845G>A, p.Cys282Tyr) and H63D (NM 254270.6: c.187C>G, p.His63Asp), that are commonly associated with HH. These variants are detected by multiplex-polymerase chain reaction (PCR) amplification, followed by restriction enzyme digestion and capill ary electrophoresis. [place] Kitchen LIMITATIONS: This assay does not detect other pathogenic variants in the HFE gene that may be associated with HH. Although rare, false positive or false negative results may occur. All results should be  interpreted in the context of clinical findings, relevant history, and other laboratory data. . Health care providers, please contact your local [facility] genetic counselor or call [phone] for assistance with the interpretation of these results. . This test was developed and its analytical performance characteristics have been determined by [facility]. It has not been cleared or approved by FDA. This assay has been validated pursuant to the CLIA regulations and is used for clinical purposes. . For more information, please refer to http://education.questdiagnostics.com/faq/hemochromatosis. (This link is being provided for informational/educational purposes only.)  Reviewed and signed by Juan Quam, MD, MHA, [facility], CGMBS, Signed on 08/26/2020 at 01:47   Iron, TIBC and Ferritin Panel     Status: Abnormal   Collection Time: 08/17/20  9:30 AM  Result Value Ref Range   Iron 95 50 - 180 mcg/dL   TIBC 135 (L) 250 - 425 mcg/dL (calc)   %SAT 70 (H) 20 - 48 % (calc)   Ferritin 657 (H) 38 - 380 ng/mL  Hepatic function panel     Status: Abnormal   Collection Time: 08/17/20  9:30 AM  Result Value Ref Range   Total Protein 6.8 6.1 - 8.1 g/dL   Albumin 2.1 (L) 3.6 - 5.1 g/dL   Globulin 4.7 (H) 1.9 - 3.7 g/dL (calc)   AG Ratio 0.4 (L) 1.0 - 2.5 (calc)   Total Bilirubin 0.4 0.2 - 1.2 mg/dL   Bilirubin, Direct 0.1 0.0 - 0.2 mg/dL   Indirect Bilirubin 0.3 0.2 - 1.2 mg/dL (calc)   Alkaline phosphatase (APISO) 108 35 -  144 U/L   AST 26 10 - 35 U/L   ALT 11 9 - 46 U/L  Protime-INR     Status: Abnormal   Collection Time: 08/17/20  9:30 AM  Result Value Ref Range   INR 1.3 (H)     Comment: Reference Range                     0.9-1.1 Moderate-intensity Warfarin Therapy 2.0-3.0 Higher-intensity Warfarin Therapy   3.0-4.0  .    Prothrombin Time 13.1 (H) 9.0 - 11.5 sec    Comment: For additional  information, please refer to http://education.questdiagnostics.com/faq/FAQ104 (This link is being provided for informational/ educational purposes only.)   AFP tumor marker     Status: None   Collection Time: 08/17/20  9:30 AM  Result Value Ref Range   AFP-Tumor Marker 2.4 <6.1 ng/mL    Comment: . This test was performed using the Beckman Coulter chemiluminescent method. Values obtained from different assay methods cannot be used interchangeably. AFP levels, regardless of value, should not be interpreted as absolute evidence of the presence or absence of disease. .   Basic Metabolic Panel (BMET)     Status: Abnormal   Collection Time: 08/31/20  9:58 AM  Result Value Ref Range   Glucose, Bld 77 65 - 99 mg/dL    Comment: .            Fasting reference interval .    BUN 7 7 - 25 mg/dL   Creat 1.25 0.70 - 1.33 mg/dL    Comment: For patients >65 years of age, the reference limit for Creatinine is approximately 13% higher for people identified as African-American. .    BUN/Creatinine Ratio NOT APPLICABLE 6 - 22 (calc)   Sodium 137 135 - 146 mmol/L   Potassium 3.2 (L) 3.5 - 5.3 mmol/L   Chloride 102 98 - 110 mmol/L   CO2 28 20 - 32 mmol/L   Calcium 7.8 (L) 8.6 - 10.3 mg/dL  Basic Metabolic Panel (BMET)     Status: Abnormal   Collection Time: 09/06/20 10:08 AM  Result Value Ref Range   Glucose, Bld 76 65 - 99 mg/dL    Comment: .            Fasting reference interval .    BUN 7 7 - 25 mg/dL   Creat 1.13 0.70 - 1.33 mg/dL    Comment: For patients >7 years of age, the reference limit for Creatinine is approximately 13% higher for people identified as African-American. .    BUN/Creatinine Ratio NOT APPLICABLE 6 - 22 (calc)   Sodium 139 135 - 146 mmol/L   Potassium 3.5 3.5 - 5.3 mmol/L   Chloride 107 98 - 110 mmol/L   CO2 25 20 - 32 mmol/L   Calcium 7.9 (L) 8.6 - 10.3 mg/dL    RADIOGRAPHIC STUDIES: I have personally reviewed the radiological images as listed and  agreed with the findings in the report. MR 3D Recon At Scanner  Result Date: 08/25/2020 CLINICAL DATA:  Abdominal pain with bulging, irregular, and enlarged appearance of the ampulla on 07/22/2020 EGD EXAM: MRI ABDOMEN WITHOUT AND WITH CONTRAST (INCLUDING MRCP) TECHNIQUE: Multiplanar multisequence MR imaging of the abdomen was performed both before and after the administration of intravenous contrast. Heavily T2-weighted images of the biliary and pancreatic ducts were obtained, and three-dimensional MRCP images were rendered by post processing. CONTRAST:  7.72mL GADAVIST GADOBUTROL 1 MMOL/ML IV SOLN COMPARISON:  Abdominal ultrasound 07/14/2020 and  CT abdomen from 01/14/2013 FINDINGS: Despite efforts by the technologist and patient, motion artifact is present on today's exam and could not be eliminated. This reduces exam sensitivity and specificity. Lower chest: Unremarkable Hepatobiliary: Gallbladder wall thickening up to about 0.5 cm with mild contraction of the gallbladder. No gallstones are identified. Common bile duct 0.6 cm in diameter with expected normal distal tapering and no well-defined filling defect. Diffuse hepatic steatosis. Hepatic morphology suggests early cirrhosis. No hepatic mass identified. Pancreas: Pancreas divisum. Upper abdominal peripancreatic edema although this may be related to the background mesenteric edema rather than necessarily indicating pancreatitis. No dorsal pancreatic duct dilatation. No pancreatic mass is identified. There at most subtle prominence of the ampulla for example on image 55 of series 25, but without a well-defined mass identified. Spleen:  Unremarkable Adrenals/Urinary Tract: Left renal atrophy. Adrenal glands unremarkable. Stomach/Bowel: No dilated bowel. Descending colon diverticulosis. Scattered diverticula in the transverse colon. Vascular/Lymphatic: Aortoiliac atherosclerotic vascular disease. Small right gastric and retroperitoneal lymph nodes are not  pathologically enlarged by size criteria. Other: Upper abdominal ascites. Upper abdominal mesenteric and peripancreatic edema. Musculoskeletal: Hemangioma in the L1 vertebral body. Degenerative disc disease at L5-S1. IMPRESSION: 1. There is only subtle prominence of the ampulla without a well-defined mass identified. Given the discrepant findings between MRI and endoscopy, a low threshold for repeat endoscopy should be considered particularly if the patient develops any indicators of biliary obstruction. 2. Pancreas divisum. 3. Upper abdominal ascites and mesenteric and peripancreatic edema. Correlate with pancreatic enzyme levels and excluding pancreatitis. 4. Gallbladder wall thickening. Although probably from the patient has known hypoalbuminemia, cholecystitis is not completely excluded. 5. Diffuse hepatic steatosis. Hepatic morphology raises the possibility of early cirrhosis. 6.  Aortic Atherosclerosis (ICD10-I70.0). Electronically Signed   By: Van Clines M.D.   On: 08/25/2020 11:31   VAS [place] IVC/ILIAC (VENOUS ONLY)  Result Date: 09/10/2020 IVC/ILIAC STUDY Patient Name:  NATALIO BUSTILLOS  Date of Exam:   09/10/2020 Medical Rec #: [id number]     Accession #:    [id number] Date of Birth: 1965-06-19      Patient Gender: M Patient Age:   55Y Exam Location:  [facility] Procedure:      VAS [place] IVC/ILIAC (VENOUS ONLY) Referring Phys: [id number] [place] Dom CAIN --------------------------------------------------------------------------------  Indications: IVC evaluation Vascular Interventions: 08/10/2020 Mechanical thrombectomy of left external                         iliac, left common femoral and left femoral veins with                         Inari clottriever, balloon angioplasty of left external                         iliac vein and left common femoral and femoral veins. Limitations: Air/bowel gas.  Performing Technologist: Alvia Grove RVT  Examination Guidelines: A complete evaluation  includes B-mode imaging, spectral Doppler, color Doppler, and power Doppler as needed of all accessible portions of each vessel. Bilateral testing is considered an integral part of a complete examination. Limited examinations for reoccurring indications may be performed as noted.  IVC/Iliac Findings: +----------+------+--------+--------+    IVC    PatentThrombusComments +----------+------+--------+--------+ IVC Prox  patent                 +----------+------+--------+--------+ IVC Mid   patent                 +----------+------+--------+--------+  IVC Distalpatent                 +----------+------+--------+--------+  +-------------------+---------+-----------+---------+-----------+--------+         CIV        RT-PatentRT-ThrombusLT-PatentLT-ThrombusComments +-------------------+---------+-----------+---------+-----------+--------+ Common Iliac Prox   patent              patent                      +-------------------+---------+-----------+---------+-----------+--------+ Common Iliac Mid      NV                patent                      +-------------------+---------+-----------+---------+-----------+--------+ Common Iliac Distal patent                NV                        +-------------------+---------+-----------+---------+-----------+--------+  +-------------------------+---------+-----------+---------+-----------+--------+            EIV           RT-PatentRT-ThrombusLT-PatentLT-ThrombusComments +-------------------------+---------+-----------+---------+-----------+--------+ External Iliac Vein Prox                                            NV    +-------------------------+---------+-----------+---------+-----------+--------+ External Iliac Vein Mid                                             NV    +-------------------------+---------+-----------+---------+-----------+--------+ External Iliac Vein       patent              patent                       Distal                                                                    +-------------------------+---------+-----------+---------+-----------+--------+   Summary: IVC/Iliac: Suboptimal exam. The IVC appears patent, The proximal right common and external iliac veins appear patent. The segments of the left common iliac and external iliac veis visualized appear patent.  *See table(s) above for measurements and observations.  Electronically signed by Servando Snare MD on 09/10/2020 at 10:23:34 AM.    Final    MR ABDOMEN MRCP W WO CONTAST  Result Date: 08/25/2020 CLINICAL DATA:  Abdominal pain with bulging, irregular, and enlarged appearance of the ampulla on 07/22/2020 EGD EXAM: MRI ABDOMEN WITHOUT AND WITH CONTRAST (INCLUDING MRCP) TECHNIQUE: Multiplanar multisequence MR imaging of the abdomen was performed both before and after the administration of intravenous contrast. Heavily T2-weighted images of the biliary and pancreatic ducts were obtained, and three-dimensional MRCP images were rendered by post processing. CONTRAST:  7.61mL GADAVIST GADOBUTROL 1 MMOL/ML IV SOLN COMPARISON:  Abdominal ultrasound 07/14/2020 and CT abdomen from 01/14/2013 FINDINGS: Despite efforts by the technologist and patient, motion artifact is present on today's exam and could not be eliminated. This reduces exam sensitivity and specificity. Lower  chest: Unremarkable Hepatobiliary: Gallbladder wall thickening up to about 0.5 cm with mild contraction of the gallbladder. No gallstones are identified. Common bile duct 0.6 cm in diameter with expected normal distal tapering and no well-defined filling defect. Diffuse hepatic steatosis. Hepatic morphology suggests early cirrhosis. No hepatic mass identified. Pancreas: Pancreas divisum. Upper abdominal peripancreatic edema although this may be related to the background mesenteric edema rather than necessarily indicating pancreatitis. No dorsal pancreatic duct  dilatation. No pancreatic mass is identified. There at most subtle prominence of the ampulla for example on image 55 of series 25, but without a well-defined mass identified. Spleen:  Unremarkable Adrenals/Urinary Tract: Left renal atrophy. Adrenal glands unremarkable. Stomach/Bowel: No dilated bowel. Descending colon diverticulosis. Scattered diverticula in the transverse colon. Vascular/Lymphatic: Aortoiliac atherosclerotic vascular disease. Small right gastric and retroperitoneal lymph nodes are not pathologically enlarged by size criteria. Other: Upper abdominal ascites. Upper abdominal mesenteric and peripancreatic edema. Musculoskeletal: Hemangioma in the L1 vertebral body. Degenerative disc disease at L5-S1. IMPRESSION: 1. There is only subtle prominence of the ampulla without a well-defined mass identified. Given the discrepant findings between MRI and endoscopy, a low threshold for repeat endoscopy should be considered particularly if the patient develops any indicators of biliary obstruction. 2. Pancreas divisum. 3. Upper abdominal ascites and mesenteric and peripancreatic edema. Correlate with pancreatic enzyme levels and excluding pancreatitis. 4. Gallbladder wall thickening. Although probably from the patient has known hypoalbuminemia, cholecystitis is not completely excluded. 5. Diffuse hepatic steatosis. Hepatic morphology raises the possibility of early cirrhosis. 6.  Aortic Atherosclerosis (ICD10-I70.0). Electronically Signed   By: Van Clines M.D.   On: 08/25/2020 11:31   VAS [place] LOWER EXTREMITY VENOUS (DVT)  Result Date: 09/10/2020  Lower Venous DVT Study Patient Name:  KANNIN QIN  Date of Exam:   09/10/2020 Medical Rec #: [id number]     Accession #:    [id number] Date of Birth: 1965-07-03      Patient Gender: M Patient Age:   55Y Exam Location:  [facility] Procedure:      VAS [place] LOWER EXTREMITY VENOUS (DVT) Referring Phys: [id number] [place]  --------------------------------------------------------------------------------  Indications: Follow up DVT left lower extremtiy. Other Indications: Mechanical thrombectomy of left external iliac, left common                    femoral and left femoral vein with Inari clottriever. Balloon                    angioplasty left external iliac vein and left common femoral                    and femoral veins. Risk Factors: DVT 08/04/2020 : Extensive DVT throughout the left lower extremity from the CFV to the calf veins.. Limitations: Poor ultrasound/tissue interface. Performing Technologist: Alvia Grove RVT  Examination Guidelines: A complete evaluation includes B-mode imaging, spectral Doppler, color Doppler, and power Doppler as needed of all accessible portions of each vessel. Bilateral testing is considered an integral part of a complete examination. Limited examinations for reoccurring indications may be performed as noted. The reflux portion of the exam is performed with the patient in reverse Trendelenburg.  +-----+---------------+---------+-----------+----------+--------------+ RIGHTCompressibilityPhasicitySpontaneityPropertiesThrombus Aging +-----+---------------+---------+-----------+----------+--------------+ CFV  Full           Yes      Yes                                 +-----+---------------+---------+-----------+----------+--------------+   +---------+---------------+---------+-----------+----------+--------------+  LEFT     CompressibilityPhasicitySpontaneityPropertiesThrombus Aging +---------+---------------+---------+-----------+----------+--------------+ CFV      Partial        Yes      Yes                  Chronic        +---------+---------------+---------+-----------+----------+--------------+ SFJ      Full           Yes      Yes                                 +---------+---------------+---------+-----------+----------+--------------+ FV Prox  Partial        Yes       Yes                                 +---------+---------------+---------+-----------+----------+--------------+ FV Mid   Partial        Yes      Yes                                 +---------+---------------+---------+-----------+----------+--------------+ FV DistalNone           No       No                                  +---------+---------------+---------+-----------+----------+--------------+ PFV      Full           Yes      Yes                                 +---------+---------------+---------+-----------+----------+--------------+ POP      None           No       No                                  +---------+---------------+---------+-----------+----------+--------------+ PTV      Partial        No       No                                  +---------+---------------+---------+-----------+----------+--------------+ PERO     NV                                                          +---------+---------------+---------+-----------+----------+--------------+ GSV      Full           Yes      Yes                                 +---------+---------------+---------+-----------+----------+--------------+ SSV      Full           Yes      Yes                                 +---------+---------------+---------+-----------+----------+--------------+  Summary: LEFT: - There is no evidence of acute deep or superficial vein thrombosis in the lower extremity. - Evidence of residual thrombus noted throughout the lower extremtiy, see above.  *See table(s) above for measurements and observations. Electronically signed by Servando Snare MD on 09/10/2020 at 5:03:22 PM.    Final     ASSESSMENT:  1. 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 The patient knows to call the clinic with any problems, questions or concerns. I spent {CHL ONC TIME VISIT - [id number] counseling the  patient face to face. The total time spent in the appointment was {CHL ONC TIME VISIT - [id number] and more than 50% was on counseling.   Derek Jack, MD 09/11/20 3:27 PM  [place] [phone]   I, Thana Ates, am acting as a scribe for Dr. Derek Jack.  {Add Barista Statement}

## 2020-09-13 ENCOUNTER — Inpatient Hospital Stay (HOSPITAL_COMMUNITY): Payer: Medicaid Other

## 2020-09-13 ENCOUNTER — Encounter (HOSPITAL_COMMUNITY): Payer: Self-pay | Admitting: Hematology

## 2020-09-13 ENCOUNTER — Inpatient Hospital Stay (HOSPITAL_COMMUNITY): Payer: Medicaid Other | Attending: Hematology | Admitting: Hematology

## 2020-09-13 ENCOUNTER — Other Ambulatory Visit: Payer: Self-pay

## 2020-09-13 VITALS — BP 117/78 | HR 80 | Temp 96.9°F | Resp 18 | Ht 72.0 in | Wt 169.0 lb

## 2020-09-13 DIAGNOSIS — R7989 Other specified abnormal findings of blood chemistry: Secondary | ICD-10-CM | POA: Diagnosis not present

## 2020-09-13 DIAGNOSIS — F17210 Nicotine dependence, cigarettes, uncomplicated: Secondary | ICD-10-CM | POA: Diagnosis not present

## 2020-09-13 DIAGNOSIS — Z79899 Other long term (current) drug therapy: Secondary | ICD-10-CM | POA: Diagnosis not present

## 2020-09-13 DIAGNOSIS — K760 Fatty (change of) liver, not elsewhere classified: Secondary | ICD-10-CM | POA: Diagnosis not present

## 2020-09-13 DIAGNOSIS — R5383 Other fatigue: Secondary | ICD-10-CM | POA: Insufficient documentation

## 2020-09-13 DIAGNOSIS — R200 Anesthesia of skin: Secondary | ICD-10-CM | POA: Diagnosis not present

## 2020-09-13 DIAGNOSIS — F1021 Alcohol dependence, in remission: Secondary | ICD-10-CM | POA: Diagnosis not present

## 2020-09-13 DIAGNOSIS — D649 Anemia, unspecified: Secondary | ICD-10-CM | POA: Insufficient documentation

## 2020-09-13 DIAGNOSIS — E876 Hypokalemia: Secondary | ICD-10-CM

## 2020-09-13 DIAGNOSIS — K7030 Alcoholic cirrhosis of liver without ascites: Secondary | ICD-10-CM | POA: Insufficient documentation

## 2020-09-13 DIAGNOSIS — J449 Chronic obstructive pulmonary disease, unspecified: Secondary | ICD-10-CM | POA: Diagnosis not present

## 2020-09-13 DIAGNOSIS — R202 Paresthesia of skin: Secondary | ICD-10-CM | POA: Diagnosis not present

## 2020-09-13 DIAGNOSIS — Z7901 Long term (current) use of anticoagulants: Secondary | ICD-10-CM | POA: Diagnosis not present

## 2020-09-13 DIAGNOSIS — I82402 Acute embolism and thrombosis of unspecified deep veins of left lower extremity: Secondary | ICD-10-CM | POA: Diagnosis not present

## 2020-09-13 DIAGNOSIS — Z7951 Long term (current) use of inhaled steroids: Secondary | ICD-10-CM | POA: Insufficient documentation

## 2020-09-13 DIAGNOSIS — I82492 Acute embolism and thrombosis of other specified deep vein of left lower extremity: Secondary | ICD-10-CM

## 2020-09-13 LAB — CBC WITH DIFFERENTIAL/PLATELET
Abs Immature Granulocytes: 0.03 10*3/uL (ref 0.00–0.07)
Basophils Absolute: 0.1 10*3/uL (ref 0.0–0.1)
Basophils Relative: 1 %
Eosinophils Absolute: 0.1 10*3/uL (ref 0.0–0.5)
Eosinophils Relative: 1 %
HCT: 40.3 % (ref 39.0–52.0)
Hemoglobin: 13.4 g/dL (ref 13.0–17.0)
Immature Granulocytes: 0 %
Lymphocytes Relative: 21 %
Lymphs Abs: 1.8 10*3/uL (ref 0.7–4.0)
MCH: 28.5 pg (ref 26.0–34.0)
MCHC: 33.3 g/dL (ref 30.0–36.0)
MCV: 85.7 fL (ref 80.0–100.0)
Monocytes Absolute: 0.7 10*3/uL (ref 0.1–1.0)
Monocytes Relative: 8 %
Neutro Abs: 6.2 10*3/uL (ref 1.7–7.7)
Neutrophils Relative %: 69 %
Platelets: 359 10*3/uL (ref 150–400)
RBC: 4.7 MIL/uL (ref 4.22–5.81)
RDW: 15.1 % (ref 11.5–15.5)
WBC: 8.9 10*3/uL (ref 4.0–10.5)
nRBC: 0 % (ref 0.0–0.2)

## 2020-09-13 LAB — IRON AND TIBC
Iron: 127 ug/dL (ref 45–182)
Saturation Ratios: 78 % — ABNORMAL HIGH (ref 17.9–39.5)
TIBC: 163 ug/dL — ABNORMAL LOW (ref 250–450)
UIBC: 36 ug/dL

## 2020-09-13 LAB — COMPREHENSIVE METABOLIC PANEL
ALT: 12 U/L (ref 0–44)
AST: 26 U/L (ref 15–41)
Albumin: 2 g/dL — ABNORMAL LOW (ref 3.5–5.0)
Alkaline Phosphatase: 96 U/L (ref 38–126)
Anion gap: 10 (ref 5–15)
BUN: 9 mg/dL (ref 6–20)
CO2: 25 mmol/L (ref 22–32)
Calcium: 7.5 mg/dL — ABNORMAL LOW (ref 8.9–10.3)
Chloride: 100 mmol/L (ref 98–111)
Creatinine, Ser: 1.46 mg/dL — ABNORMAL HIGH (ref 0.61–1.24)
GFR, Estimated: 56 mL/min — ABNORMAL LOW (ref >60)
Glucose, Bld: 85 mg/dL (ref 70–99)
Potassium: 2.6 mmol/L — CL (ref 3.5–5.1)
Sodium: 135 mmol/L (ref 135–145)
Total Bilirubin: 0.7 mg/dL (ref 0.3–1.2)
Total Protein: 7.8 g/dL (ref 6.5–8.1)

## 2020-09-13 LAB — LACTATE DEHYDROGENASE: LDH: 211 U/L — ABNORMAL HIGH (ref 98–192)

## 2020-09-13 LAB — FERRITIN: Ferritin: 488 ng/mL — ABNORMAL HIGH (ref 24–336)

## 2020-09-13 LAB — FOLATE: Folate: 4.9 ng/mL — ABNORMAL LOW (ref >5.9)

## 2020-09-13 LAB — RETICULOCYTES
Immature Retic Fract: 8.5 % (ref 2.3–15.9)
RBC.: 4.68 MIL/uL (ref 4.22–5.81)
Retic Count, Absolute: 59.4 10*3/uL (ref 19.0–186.0)
Retic Ct Pct: 1.3 % (ref 0.4–3.1)

## 2020-09-13 LAB — VITAMIN B12: Vitamin B-12: 241 pg/mL (ref 180–914)

## 2020-09-13 LAB — VITAMIN D 25 HYDROXY (VIT D DEFICIENCY, FRACTURES): Vit D, 25-Hydroxy: 20.88 ng/mL — ABNORMAL LOW (ref 30–100)

## 2020-09-13 MED ORDER — POTASSIUM CHLORIDE CRYS ER 20 MEQ PO TBCR: 20.0000 meq | EXTENDED_RELEASE_TABLET | Freq: Two times a day (BID) | ORAL | 0 refills | Status: DC

## 2020-09-13 NOTE — Patient Instructions (Signed)
[place] at [facility] Discharge Instructions  You were seen today by Dr. Delton Coombes and Tarri Abernethy PA-C for your elevated iron levels and mild anemia.  Your elevated iron is most likely due to your liver disease.  We will order some tests to find out why you have anemia.  This may be related to your other chronic diseases.  LABS: Get labs today before you leave the hospital, on the first floor  OTHER TESTS: Return stool cards to the labs as soon as they are completed  MEDICATIONS: No changes.  Do NOT take any iron supplements at home.  FOLLOW-UP APPOINTMENT: Return in 4 weeks to discuss lab results and next steps.   Thank you for choosing [place] at [facility] to provide your oncology and hematology care.  To afford each patient quality time with our provider, please arrive at least 15 minutes before your scheduled appointment time.   If you have a lab appointment with the [place] please come in thru the Main Entrance and check in at the main information desk.  You need to re-schedule your appointment should you arrive 10 or more minutes late.  We strive to give you quality time with our providers, and arriving late affects you and other patients whose appointments are after yours.  Also, if you no show three or more times for appointments you may be dismissed from the clinic at the providers discretion.     Again, thank you for choosing [facility].  Our hope is that these requests will decrease the amount of time that you wait before being seen by our physicians.       _____________________________________________________________  Should you have questions after your visit to [facility], please contact our office at [phone] and follow the prompts.  Our office hours are 8:00 a.m. and 4:30 p.m. Monday - Friday.  Please note that voicemails left after 4:00 p.m. may not be returned until the  following business day.  We are closed weekends and major holidays.  You do have access to a nurse 24-7, just call the main number to the clinic [phone] and do not press any options, hold on the line and a nurse will answer the phone.    For prescription refill requests, have your pharmacy contact our office and allow 72 hours.    Due to Covid, you will need to wear a mask upon entering the hospital. If you do not have a mask, a mask will be given to you at the Main Entrance upon arrival. For doctor visits, patients may have 1 support person age 46 or older with them. For treatment visits, patients can not have anyone with them due to social distancing guidelines and our immunocompromised population.

## 2020-09-13 NOTE — Progress Notes (Unsigned)
Reported Potassium by secure chat to [facility] PA 2.6. Written orders received from [place]. E-script sent to patient's pharmacy. Potassium 20 mEq BID x 4 days PO and follow up with PCP in one week for repeat labs. Per [facility] patient has a diagnosis of chronic Hypokalemia managed by his PCP.   Patient returned call to the clinic at 11:48 am. Patient instructed to pick up prescription for Potassium 20 mEq that he should take twice per day by mouth for 4 days. Follow up with PCP in one week for repeat labs. Understanding verbalized. Orders received from [place] PA.

## 2020-09-13 NOTE — Progress Notes (Signed)
[place] [address] [place] [address]   CLINIC:  Medical Oncology/Hematology  CONSULT NOTE  Patient Care Team: Rosita Fire, MD as PCP - General (Internal Medicine) Gala Romney [name], MD as Consulting Physician (Gastroenterology)  CHIEF COMPLAINTS/PURPOSE OF CONSULTATION:  Elevated ferritin  HISTORY OF PRESENTING ILLNESS:  [name] [name] [name] 55 y.o. male is here because at the request of NP Walden Field of [facility] due to elevated ferritin.  [name] [name] [name] was previously diagnosed with alcoholic liver cirrhosis, and was found to have elevated ferritin 1,254 on 07/20/2020 (normal ferritin noted in 2018).  This is decreased somewhat as of 08/17/2020, most recent ferritin 657.  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  He underwent MRI/MRCP on 08/25/2020, which did not mention any increased liver iron concentration, but did show diffuse hepatic steatosis with signs of early cirrhosis.  Most recent CBC on 08/11/2020 shows normocytic anemia with Hgb 10.1 and MCV 89.7.  Of note, the patient has also been treated elsewhere for left lower extremity DVT (beginning at the level of the left external iliac vein origin and extending into the common femoral, superficial femoral, and deep femoral veins to the level of the popliteal vein).  This was diagnosed on 08/04/2020, and he has been started on Eliquis.  His Eliquis and DVT are being managed by Dr. Servando Snare of vascular surgery, who performed ultrasound-guided LLE thrombectomy on the patient, and who continues to see the patient in follow-up.  He was last seen by vascular specialists on 09/10/2020, and has been instructed to be on indefinite Eliquis.  At the time of today's visit, the patient reports that he is feeling fair.  His only complaint is mild fatigue (energy 80%) and numbness and tingling in legs that has been ongoing since the time of his thrombectomy.  He reports  a good appetite and that he is maintaining a stable weight.  He denies leg swelling, pain, and erythema.  He does have ongoing left leg paresthesias since he was diagnosed with DVT.  No shortness of breath, dyspnea on exertion, chest pain, cough, hemoptysis, or palpitations.  He is faithfully taking Eliquis without any missed doses.  He denies any signs or symptoms of bleeding; no tarry black stool or bright red blood per rectum, no hematemesis, hemoptysis, hematuria, hematochezia, melena.  He was diagnosed with liver cirrhosis in the last 4-5 months.  The patient was formerly a heavy drinker, and drank about a 12-pack of beer daily for over 20 years.  He quit drinking alcohol 3 months ago (February 2022) and feels much better.   He denies any family history of hemochromatosis.  He eats red meat once or twice each week.  He does not cook in cast iron pans, and he does not take any oral iron supplement.    His PMH is also notable for COPD in addition to alcoholic liver cirrhosis and left lower extremity DVT as noted above.  The patient is a recovering alcoholic - previously drank 12 beers per day for 20 + years, quit in February 202.  He is an everyday tobacco user, smoking 0.5 PPD for the past 30 years.  He denies illicit drug use.  He is on disablity and lives at home with his fiance.  No family history of liver diease or blood clots.  [name] had throat cancer secondary to smoking.  No family hemochromatosis history.     MEDICAL HISTORY:  Past Medical  History:  Diagnosis Date  . Asthma   . Dyspnea   . High cholesterol   . History of [name] [name] stones   . Hypertension   . [name] [name] stones     SURGICAL HISTORY: Past Surgical History:  Procedure Laterality Date  . APPENDECTOMY    . BIOPSY  07/22/2020   Procedure: BIOPSY;  Surgeon: Daneil Dolin, MD;  [facility]: [facility];  Service: Endoscopy;;  . COLONOSCOPY WITH PROPOFOL N/A 04/18/2017   non-bleeding internal hemorrhoids, two 4-6 mm  polyps in descending colon and cecum, pancolonic diverticulosis, single cecal AVM. Tubular adenomas, surveillance in 2023.   Marland Kitchen ESOPHAGOGASTRODUODENOSCOPY (EGD) WITH PROPOFOL N/A 07/22/2020   normal esophagus, small hiatal hernia, abnormal gastric mucosa, abnormal appearing ampula and periampullary mucosa. Mild chronic gastritis.  Marland Kitchen FRACTURE SURGERY     left arm  . LOWER EXTREMITY VENOGRAPHY N/A 08/10/2020   Procedure: LOWER EXTREMITY VENOGRAPHY;  Surgeon: Waynetta Sandy, MD;  Location: [place] [facility];  Service: Cardiovascular;  Laterality: N/A;  . PERIPHERAL VASCULAR BALLOON ANGIOPLASTY Left 08/10/2020   Procedure: PERIPHERAL VASCULAR BALLOON ANGIOPLASTY;  Surgeon: Waynetta Sandy, MD;  Location: [place] [facility];  Service: Cardiovascular;  Laterality: Left;  lower extremity venous  . PERIPHERAL VASCULAR THROMBECTOMY N/A 08/10/2020   Procedure: PERIPHERAL VASCULAR THROMBECTOMY;  Surgeon: Waynetta Sandy, MD;  Location: [place] [facility];  Service: Cardiovascular;  Laterality: N/A;  . POLYPECTOMY  04/18/2017   Procedure: POLYPECTOMY;  Surgeon: Daneil Dolin, MD;  [facility];  Service: Endoscopy;;    SOCIAL HISTORY: Social History   Socioeconomic History  . Marital status: Single    Spouse name: Not on file  . Number of children: Not on file  . Years of education: Not on file  . Highest education level: Not on file  Occupational History  . Not on file  Tobacco Use  . Smoking status: Current Every Day Smoker    Packs/day: 0.50    Years: 33.00    Pack years: 16.50    Types: Cigarettes  . Smokeless tobacco: Never Used  Vaping Use  . Vaping Use: Never used  Substance and Sexual Activity  . 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 Drug use: No  . Sexual activity: Yes    Birth control/protection: None  Other Topics Concern  . Not on file  Social  History Narrative  . Not on file   Social Determinants of Health   Financial Resource Strain: High Risk  . Difficulty of Paying Living Expenses: Very hard  Food Insecurity: No Food Insecurity  . Worried About Charity fundraiser in the Last Year: Never true  . Ran Out of Food in the Last Year: Never true  Transportation Needs: No Transportation Needs  . Lack of Transportation (Medical): No  . Lack of Transportation (Non-Medical): No  Physical Activity: Sufficiently Active  . Days of Exercise per Week: 7 days  . Minutes of Exercise per Session: 30 min  Stress: No Stress Concern Present  . Feeling of Stress : Only a little  Social Connections: Moderately Isolated  . Frequency of Communication with Friends and Family: More than three times a week  . Frequency of Social Gatherings with Friends and Family: Once a week  . Attends Religious Services: More than 4 times per year  . Active Member of Clubs or  Organizations: No  . Attends Archivist Meetings: Never  . Marital Status: Never married  Intimate Partner Violence: Not At Risk  . Fear of Current or Ex-Partner: No  . Emotionally Abused: No  . Physically Abused: No  . Sexually Abused: No    FAMILY HISTORY: Family History  Problem Relation Age of Onset  . Cancer Father        throat  . Diabetes Sister   . Colon cancer Neg Hx     ALLERGIES:  has No Known Allergies.  MEDICATIONS:  Current Outpatient Medications  Medication Sig Dispense Refill  . albuterol (PROVENTIL HFA) 108 (90 Base) MCG/ACT inhaler INHALE 2 PUFFS BY MOUTH EVERY 6 HOURS AS NEEDED FOR COUGHING, WHEEZING, OR SHORTNESS OF BREATH (Patient taking differently: Inhale 1-2 puffs into the lungs every 6 (six) hours as needed for wheezing or shortness of breath.) 20.1 g 1  . APIXABAN (ELIQUIS) VTE STARTER PACK (10MG  AND 5MG ) Take as directed on package: start with two-5mg  tablets twice daily for 7 days. On day 8, switch to one-5mg  tablet twice daily. 74 tablet  0  . atorvastatin (LIPITOR) 20 MG tablet Take 1 tablet (20 mg total) by mouth daily. (Patient taking differently: Take 20 mg by mouth in the morning.) 90 tablet 2  . furosemide (LASIX) 20 MG tablet Take 1 tablet (20 mg total) by mouth daily. (Patient taking differently: Take 40 mg by mouth in the morning and at bedtime.) 30 tablet 5  . mometasone-formoterol (DULERA) 200-5 MCG/ACT AERO Inhale 2 puffs into the lungs 2 (two) times daily. (Patient taking differently: Inhale 2 puffs into the lungs 2 (two) times daily as needed (wheezing/shortness of breath).) 39 g 3  . nicotine (NICODERM CQ - DOSED IN MG/24 HOURS) 14 mg/24hr patch Place 1 patch (14 mg total) onto the skin daily. 14 patch 0  . nicotine (NICODERM CQ) 7 mg/24hr patch Place 1 patch (7 mg total) onto the skin daily. Start after the 14 mg patches have finished 14 patch 0  . pantoprazole (PROTONIX) 40 MG tablet Take 1 tablet (40 mg total) by mouth daily. Take 30 minutes 30 tablet 3  . potassium chloride SA (KLOR-CON) 20 MEQ tablet Take 1 tablet (20 mEq total) by mouth 2 (two) times daily for 4 days. 8 tablet 0  . spironolactone (ALDACTONE) 25 MG tablet Take 50 mg by mouth 2 (two) times daily.    . potassium chloride SA (KLOR-CON) 20 MEQ tablet Take 1 tablet (20 mEq total) by mouth 2 (two) times daily for 3 days. 6 tablet 0   No current facility-administered medications for this visit.    REVIEW OF SYSTEMS:   Review of Systems  Constitutional: Positive for fatigue (Energy 80%). Negative for appetite change, chills, diaphoresis, fever and unexpected weight change.  HENT:   Negative for lump/mass and nosebleeds.   Eyes: Negative for eye problems.  Respiratory: Negative for cough, hemoptysis and shortness of breath.   Cardiovascular: Negative for chest pain, leg swelling and palpitations.  Gastrointestinal: Negative for abdominal pain, blood in stool, constipation, diarrhea, nausea and vomiting.  Genitourinary: Negative for hematuria.   Skin:  Negative.   Neurological: Positive for numbness. Negative for dizziness, headaches and light-headedness.  Hematological: Does not bruise/bleed easily.      PHYSICAL EXAMINATION: ECOG PERFORMANCE STATUS: 1 - Symptomatic but completely ambulatory  Vitals:   09/13/20 0858  BP: 117/78  Pulse: 80  Resp: 18  Temp: (!) 96.9 F (36.1 C)  SpO2: 100%  Filed Weights   09/13/20 0858  Weight: 169 lb (76.7 kg)    Physical Exam Constitutional:      Appearance: Normal appearance.  HENT:     Head: Normocephalic and atraumatic.     Mouth/Throat:     Mouth: Mucous membranes are moist.  Eyes:     Extraocular Movements: Extraocular movements intact.     Pupils: Pupils are equal, round, and reactive to light.  Cardiovascular:     Rate and Rhythm: Normal rate and regular rhythm.     Pulses: Normal pulses.     Heart sounds: Normal heart sounds.  Pulmonary:     Effort: Pulmonary effort is normal.     Breath sounds: Normal breath sounds.  Abdominal:     General: Bowel sounds are normal. There is distension.     Palpations: Abdomen is soft.     Tenderness: There is no abdominal tenderness.  Musculoskeletal:        General: No swelling.     Right lower leg: No edema.     Left lower leg: No edema.  Lymphadenopathy:     Cervical: No cervical adenopathy.  Skin:    General: Skin is warm and dry.  Neurological:     General: No focal deficit present.     Mental Status: He is alert and oriented to person, place, and time.  Psychiatric:        Mood and Affect: Mood normal.        Behavior: Behavior normal.       LABORATORY DATA:  I have reviewed the relevant data as listed elsewhere in the patient's medical record - it is too extensive to include in this note.  RADIOGRAPHIC STUDIES: I have reviewed the relevant data as listed elsewhere in the patient's medical record - it is too extensive to include in this note.   ASSESSMENT: 1.  Elevated ferritin with H63D  -Previously diagnosed  with alcoholic liver cirrhosis, and was found to have elevated ferritin 1,254 on 07/20/2020 (normal ferritin noted in 2018).  This is decreased somewhat as of 08/17/2020, most recent ferritin 657.   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   - Most recent CBC on 08/11/2020 shows normocytic anemia with Hgb 10.1 and MCV 89.7. - Elevated ferritin most likely due to liver disease rather than H63D heterozygosity  2. Left lower extremity DVT -Patient being treated elsewhere for left lower extremity DVT (beginning at the level of the left external iliac vein origin and extending into the common femoral, superficial femoral, and deep femoral veins to the level of the popliteal vein).  This was diagnosed on 08/04/2020, and he has been started on Eliquis.   - His Eliquis and DVT are being managed by Dr. Servando Snare of vascular surgery, who performed ultrasound-guided LLE thrombectomy on the patient, and who continues to see the patient in follow-up.   - He was last seen by vascular specialists on 09/10/2020, and has been instructed to be on indefinite Eliquis.  3. Normocytic anemia - Most recent CBC on 08/11/2020 shows normocytic anemia with Hgb 10.1 and MCV 89.7. -He denies any signs or symptoms of blood loss -Possibly due to early CKD or anemia of chronic disease  4.  Other history -His PMH is also notable for COPD in addition to alcoholic liver cirrhosis and LLE  DVT as noted above. -The patient is a recovering alcoholic - previously drank 12 beers per day for 20 + years, quit in February 202.   - He is an everyday tobacco user, smoking 0.5 PPD for the past 30 years.   - He denies illicit drug use.   - He is on disablity and lives at home with his fiance. - No family history of liver diease or blood clots.  [name] had  throat cancer secondary to smoking.  No family hemochromatosis history.     PLAN:  1.  Elevated ferritin with H63D  - Elevated ferritin most likely due to liver disease rather than H63D heterozygosity - Patient counseled to continue to abstain from alcohol, avoid excessive dietary iron - Will repeat ferritin and iron panel today  2. Left lower extremity DVT -Continue Eliquis - Continue follow-up with vascular surgery  3. Normocytic anemia  -Check stool cards, nutritional panel, erythropoietin, reticulocytes, LDH, and MGUS panel  4.  Hypokalemia, acute on chronic - NOTE: Labs obtained at end of visit returned with critical potassium 2.6.  Oral potassium supplement prescribed and sent to pharmacy for 20 M EQ twice daily x4 days.  Patient instructed to take potassium as prescribed and follow-up with PCP next week for recheck.   PLAN SUMMARY & DISPOSITION: -Labs today - RTC in 1 month to discuss results and next steps  All questions were answered. The patient knows to call the clinic with any problems, questions or concerns.   Medical decision making: Moderate  Time spent on visit: I spent 25 minutes counseling the patient face to face. The total time spent in the appointment was 40 minutes and more than 50% was on counseling.  I, [name] Abernethy PA-C, have seen this patient in conjunction with Dr. Derek Jack. Greater than 50% of visit was performed by Dr. Delton Coombes.  Addendum: I have independently assessed this patient face-to-face and formulated my assessment and plan.  I agree with HPI written by Casey Burkitt, PA-C.  Heterozygous H63D unlikely to cause iron overload.  Elevated ferritin most likely secondary to alcoholic liver disease.  Recent MRI did not show any iron deposition.  We will repeat another ferritin level.  Will further work-up his normocytic anemia.  We have called in potassium supplements for his severe hypokalemia with follow-up with his PMD next  week.    Derek Jack, MD 09/13/20 5:37 PM

## 2020-09-13 NOTE — Progress Notes (Signed)
CRITICAL VALUE ALERT Critical value received: Potassium 2.6 Date of notification:  09/13/20 Time of notification: 11:13 am Critical value read back:  Yes.   Nurse who received alert:  Surveyor, quantity. Alert received from  [facility] Lab Technician  MD notified time and response:  Dr. Burna Cash RN, CEdwards LPN notified by secure chat at 11:14 am .   Called and left messages on mobile and home number . Unable to reach patient at this time.

## 2020-09-14 ENCOUNTER — Other Ambulatory Visit (HOSPITAL_COMMUNITY): Payer: Self-pay | Admitting: Physician Assistant

## 2020-09-14 DIAGNOSIS — E876 Hypokalemia: Secondary | ICD-10-CM

## 2020-09-14 LAB — KAPPA/LAMBDA LIGHT CHAINS
Kappa free light chain: 140.4 mg/L — ABNORMAL HIGH (ref 3.3–19.4)
Kappa, lambda light chain ratio: 1.12 (ref 0.26–1.65)
Lambda free light chains: 125.6 mg/L — ABNORMAL HIGH (ref 5.7–26.3)

## 2020-09-14 LAB — ERYTHROPOIETIN: Erythropoietin: 5.1 m[IU]/mL (ref 2.6–18.5)

## 2020-09-15 LAB — PROTEIN ELECTROPHORESIS, SERUM
A/G Ratio: 0.4 — ABNORMAL LOW (ref 0.7–1.7)
Albumin ELP: 2.1 g/dL — ABNORMAL LOW (ref 2.9–4.4)
Alpha-1-Globulin: 0.3 g/dL (ref 0.0–0.4)
Alpha-2-Globulin: 0.8 g/dL (ref 0.4–1.0)
Beta Globulin: 1.3 g/dL (ref 0.7–1.3)
Gamma Globulin: 2.5 g/dL — ABNORMAL HIGH (ref 0.4–1.8)
Globulin, Total: 4.8 g/dL — ABNORMAL HIGH (ref 2.2–3.9)
Total Protein ELP: 6.9 g/dL (ref 6.0–8.5)

## 2020-09-15 LAB — COPPER, SERUM: Copper: 123 ug/dL (ref 69–132)

## 2020-09-17 LAB — METHYLMALONIC ACID, SERUM: Methylmalonic Acid, Quantitative: 233 nmol/L (ref 0–378)

## 2020-09-18 LAB — IMMUNOFIXATION ELECTROPHORESIS
IgA: 1232 mg/dL — ABNORMAL HIGH (ref 90–386)
IgG (Immunoglobin G), Serum: 2552 mg/dL — ABNORMAL HIGH (ref 603–1613)
IgM (Immunoglobulin M), Srm: 147 mg/dL (ref 20–172)
Total Protein ELP: 7.1 g/dL (ref 6.0–8.5)

## 2020-09-20 LAB — HEPATIC FUNCTION PANEL
AG Ratio: 0.5 (calc) — ABNORMAL LOW (ref 1.0–2.5)
ALT: 6 U/L — ABNORMAL LOW (ref 9–46)
AST: 19 U/L (ref 10–35)
Albumin: 2.1 g/dL — ABNORMAL LOW (ref 3.6–5.1)
Alkaline phosphatase (APISO): 92 U/L (ref 35–144)
Bilirubin, Direct: 0.1 mg/dL (ref 0.0–0.2)
Globulin: 4.4 g/dL (calc) — ABNORMAL HIGH (ref 1.9–3.7)
Indirect Bilirubin: 0.3 mg/dL (calc) (ref 0.2–1.2)
Total Bilirubin: 0.4 mg/dL (ref 0.2–1.2)
Total Protein: 6.5 g/dL (ref 6.1–8.1)

## 2020-09-20 LAB — IRON,TIBC AND FERRITIN PANEL
%SAT: 72 % (calc) — ABNORMAL HIGH (ref 20–48)
Ferritin: 504 ng/mL — ABNORMAL HIGH (ref 38–380)
Iron: 112 ug/dL (ref 50–180)
TIBC: 156 mcg/dL (calc) — ABNORMAL LOW (ref 250–425)

## 2020-09-20 LAB — BASIC METABOLIC PANEL WITH GFR
BUN/Creatinine Ratio: 7 (calc) (ref 6–22)
BUN: 9 mg/dL (ref 7–25)
CO2: 27 mmol/L (ref 20–32)
Calcium: 7.8 mg/dL — ABNORMAL LOW (ref 8.6–10.3)
Chloride: 104 mmol/L (ref 98–110)
Creat: 1.38 mg/dL — ABNORMAL HIGH (ref 0.70–1.33)
GFR, Est African American: 66 mL/min/{1.73_m2} (ref >=60)
GFR, Est Non African American: 57 mL/min/{1.73_m2} — ABNORMAL LOW (ref >=60)
Glucose, Bld: 84 mg/dL (ref 65–99)
Potassium: 3.6 mmol/L (ref 3.5–5.3)
Sodium: 139 mmol/L (ref 135–146)

## 2020-09-20 LAB — AFP TUMOR MARKER: AFP-Tumor Marker: 2.6 ng/mL (ref <6.1)

## 2020-09-20 LAB — PROTIME-INR
INR: 1.2 — ABNORMAL HIGH
Prothrombin Time: 12.1 s — ABNORMAL HIGH (ref 9.0–11.5)

## 2020-09-24 ENCOUNTER — Telehealth: Payer: Self-pay | Admitting: *Deleted

## 2020-09-24 ENCOUNTER — Other Ambulatory Visit (HOSPITAL_COMMUNITY): Payer: Self-pay | Admitting: Physician Assistant

## 2020-09-24 DIAGNOSIS — E876 Hypokalemia: Secondary | ICD-10-CM

## 2020-09-24 NOTE — Telephone Encounter (Signed)
Pt called back.  Informed him that Creatinine 1.38, improved from previously. No change in diuretic therapy right now. MELD Na 12. Potassium normal.  Confirmed his appointment in June. Pt advised not to change diuretic therapy right now.  Advised to continue Lasix 40 mg BID, Aldactone 50 mg BID.  Pt voiced understanding.

## 2020-09-30 ENCOUNTER — Ambulatory Visit: Payer: Medicaid Other | Admitting: Gastroenterology

## 2020-10-02 ENCOUNTER — Other Ambulatory Visit (HOSPITAL_COMMUNITY): Payer: Self-pay | Admitting: Physician Assistant

## 2020-10-02 DIAGNOSIS — E876 Hypokalemia: Secondary | ICD-10-CM

## 2020-10-14 NOTE — Progress Notes (Deleted)
APPOINTMENT CANCELLED 

## 2020-10-15 ENCOUNTER — Inpatient Hospital Stay (HOSPITAL_COMMUNITY): Payer: Medicaid Other | Admitting: Physician Assistant

## 2020-10-19 ENCOUNTER — Encounter: Payer: Self-pay | Admitting: Internal Medicine

## 2020-10-19 ENCOUNTER — Other Ambulatory Visit: Payer: Self-pay

## 2020-10-19 ENCOUNTER — Ambulatory Visit (INDEPENDENT_AMBULATORY_CARE_PROVIDER_SITE_OTHER): Payer: Medicaid Other | Admitting: Gastroenterology

## 2020-10-19 ENCOUNTER — Encounter: Payer: Self-pay | Admitting: Gastroenterology

## 2020-10-19 VITALS — BP 127/81 | HR 94 | Temp 97.1°F | Ht 72.0 in | Wt 166.2 lb

## 2020-10-19 DIAGNOSIS — K7030 Alcoholic cirrhosis of liver without ascites: Secondary | ICD-10-CM | POA: Diagnosis not present

## 2020-10-19 NOTE — Progress Notes (Signed)
Referring Provider: Rosita Fire, MD Primary Care Physician:  Rosita Fire, MD Primary GI: Dr. Gala Romney    Chief Complaint  Patient presents with   Cirrhosis    F/u    HPI:   [name] [name] is a 55 y.o. male presenting today with a history of alcoholic liver disease, Colonoscopy on file from 2018 with tubular adenomas and surveillance due in 2023. MELD Na 12 in May 2022. Followed by Hematology for elevated ferritin. Recent EGD with normal esophagus, small hiatal hernia, abnormal gastric mucosa, abnormal appearing ampula and periampullary mucosa. Mild chronic gastritis. MRI/MRCP with subtle prominence of ampulla without well-defined mass. Pancreas divisum.   He was inpatient April 2022 after our office performed duplex ultrasound and found to have extensive LLE DVT. He underwent mechanical thrombectomy of left external iliac, left common femoral and left femoral veins by Dr. Donzetta Matters on 08/10/20. On Eliquis indefinitely.   Presents today with markedly improved bilateral lower extremity edema. No ETOH for 6 months. No jaundice, pruritis, confusion, mental status changes, overt GI bleeding. No abdominal pain or distension. Avoiding salt. He is interested in Transplant evaluation when appropriate.   Diuretic therapy: Lasix 40 mg BID, aldactone 50 BID.   Past Medical History:  Diagnosis Date   Asthma    Dyspnea    High cholesterol    History of kidney stones    Hypertension    Kidney stones     Past Surgical History:  Procedure Laterality Date   APPENDECTOMY     BIOPSY  07/22/2020   Procedure: BIOPSY;  Surgeon: Daneil Dolin, MD;  [facility];  Service: Endoscopy;;   COLONOSCOPY WITH PROPOFOL N/A 04/18/2017   non-bleeding internal hemorrhoids, two 4-6 mm polyps in descending colon and cecum, pancolonic diverticulosis, single cecal AVM. Tubular adenomas, surveillance in 2023.    ESOPHAGOGASTRODUODENOSCOPY (EGD) WITH PROPOFOL N/A 07/22/2020   normal esophagus, small hiatal  hernia, abnormal gastric mucosa, abnormal appearing ampula and periampullary mucosa. Mild chronic gastritis.   FRACTURE SURGERY     left arm   LOWER EXTREMITY VENOGRAPHY N/A 08/10/2020   Procedure: LOWER EXTREMITY VENOGRAPHY;  Surgeon: Waynetta Sandy, MD;  Location: [place] [facility];  Service: Cardiovascular;  Laterality: N/A;   PERIPHERAL VASCULAR BALLOON ANGIOPLASTY Left 08/10/2020   Procedure: PERIPHERAL VASCULAR BALLOON ANGIOPLASTY;  Surgeon: Waynetta Sandy, MD;  Location: [place] [facility];  Service: Cardiovascular;  Laterality: Left;  lower extremity venous   PERIPHERAL VASCULAR THROMBECTOMY N/A 08/10/2020   Procedure: PERIPHERAL VASCULAR THROMBECTOMY;  Surgeon: Waynetta Sandy, MD;  Location: [place] [facility];  Service: Cardiovascular;  Laterality: N/A;   POLYPECTOMY  04/18/2017   Procedure: POLYPECTOMY;  Surgeon: Daneil Dolin, MD;  [facility];  Service: Endoscopy;;    Current Outpatient Medications  Medication Sig Dispense Refill   albuterol (PROVENTIL HFA) 108 (90 Base) MCG/ACT inhaler INHALE 2 PUFFS BY MOUTH EVERY 6 HOURS AS NEEDED FOR COUGHING, WHEEZING, OR SHORTNESS OF BREATH (Patient taking differently: Inhale 1-2 puffs into the lungs every 6 (six) hours as needed for wheezing or shortness of breath.) 20.1 g 1   APIXABAN (ELIQUIS) VTE STARTER PACK (10MG  AND 5MG ) Take as directed on package: start with two-5mg  tablets twice daily for 7 days. On day 8, switch to one-5mg  tablet twice daily. (Patient taking differently: 1 tablet twice a day) 74 tablet 0   atorvastatin (LIPITOR) 20 MG tablet Take 1 tablet (20 mg total) by mouth daily. (Patient taking differently:  Take 20 mg by mouth in the morning.) 90 tablet 2   furosemide (LASIX) 20 MG tablet Take 1 tablet (20 mg total) by mouth daily. (Patient taking differently: Take 40 mg by mouth in the morning and at bedtime.) 30 tablet 5   mometasone-formoterol (DULERA) 200-5 MCG/ACT AERO Inhale 2  puffs into the lungs 2 (two) times daily. (Patient taking differently: Inhale 2 puffs into the lungs 2 (two) times daily as needed (wheezing/shortness of breath).) 39 g 3   nicotine (NICODERM CQ - DOSED IN MG/24 HOURS) 14 mg/24hr patch Place 1 patch (14 mg total) onto the skin daily. 14 patch 0   pantoprazole (PROTONIX) 40 MG tablet Take 1 tablet (40 mg total) by mouth daily. Take 30 minutes 30 tablet 3   spironolactone (ALDACTONE) 25 MG tablet Take 50 mg by mouth 2 (two) times daily.     nicotine (NICODERM CQ) 7 mg/24hr patch Place 1 patch (7 mg total) onto the skin daily. Start after the 14 mg patches have finished (Patient not taking: Reported on 10/19/2020) 14 patch 0   potassium chloride SA (KLOR-CON) 20 MEQ tablet Take 1 tablet (20 mEq total) by mouth 2 (two) times daily for 3 days. (Patient not taking: Reported on 10/19/2020) 6 tablet 0   potassium chloride SA (KLOR-CON) 20 MEQ tablet TAKE 1 TABLET(20 MEQ) BY MOUTH TWICE DAILY FOR 4 DAYS (Patient not taking: Reported on 10/19/2020) 8 tablet 0   No current facility-administered medications for this visit.    Allergies as of 10/19/2020   (No Known Allergies)    Family History  Problem Relation Age of Onset   Cancer Father        throat   Diabetes Sister    Colon cancer Neg Hx     Social History   Socioeconomic History   Marital status: Single    Spouse name: Not on file   Number of children: Not on file   Years of education: Not on file   Highest education level: Not on file  Occupational History   Not on file  Tobacco Use   Smoking status: Every Day    Packs/day: 0.50    Years: 33.00    Pack years: 16.50    Types: Cigarettes   Smokeless tobacco: Never  Vaping Use   Vaping Use: Never used  Substance and Sexual Activity   Alcohol use: No    Comment: history of ETOH use 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 76s, no ETOH for about 4 weeks as of 07/07/20   Drug use: No   Sexual activity: Yes    Birth control/protection: None   Other Topics Concern   Not on file  Social History Narrative   Not on file   Social Determinants of Health   Financial Resource Strain: High Risk   Difficulty of Paying Living Expenses: Very hard  Food Insecurity: No Food Insecurity   Worried About Charity fundraiser in the Last Year: Never true   Ran Out of Food in the Last Year: Never true  Transportation Needs: No Transportation Needs   Lack of Transportation (Medical): No   Lack of Transportation (Non-Medical): No  Physical Activity: Sufficiently Active   Days of Exercise per Week: 7 days   Minutes of Exercise per Session: 30 min  Stress: No Stress Concern Present   Feeling of Stress : Only a little  Social Connections: Moderately Isolated   Frequency of Communication with Friends and Family: More than three times a  week   Frequency of Social Gatherings with Friends and Family: Once a week   Attends Religious Services: More than 4 times per year   Active Member of [employer] or Organizations: No   Attends Archivist Meetings: Never   Marital Status: Never married    Review of Systems: Gen: Denies fever, chills, anorexia. Denies fatigue, weakness, weight loss.  CV: Denies chest pain, palpitations, syncope, peripheral edema, and claudication. Resp: Denies dyspnea at rest, cough, wheezing, coughing up blood, and pleurisy. GI: Denies vomiting blood, jaundice, and fecal incontinence.   Denies dysphagia or odynophagia. Derm: Denies rash, itching, dry skin Psych: Denies depression, anxiety, memory loss, confusion. No homicidal or suicidal ideation.  Heme: Denies bruising, bleeding, and enlarged lymph nodes.  Physical Exam: BP 127/81   Pulse 94   Temp (!) 97.1 F (36.2 C)   Ht 6' (1.829 m)   Wt 166 lb 3.2 oz (75.4 kg)   BMI 22.54 kg/m  General:   Alert and oriented. No distress noted. Pleasant and cooperative.  Head:  Normocephalic and atraumatic. Eyes:  Conjuctiva clear without scleral icterus. Mouth:  mask in  place Abdomen:  +BS, soft, non-tender and non-distended. No rebound or guarding. Liver margin palpable below right costal margin Msk:  Symmetrical without gross deformities. Normal posture. Extremities:  bilateral compression stockings, mild edema bilaterally. Much improved Neurologic:  Alert and  oriented x4 Psych:  Alert and cooperative. Normal mood and affect.  ASSESSMENT: [name] [name] is a 55 y.o. male presenting today with history of ETOH cirrhosis, elevated ferritin likely related to liver disease and followed by Hematology, recent extensive DVT April 2022 with mechanical thrombectomy and on Eliquis indefinitely, presenting in follow-up.  Cirrhosis: overall compensated. No obvious ascites on exam and lower extremity edema much improved. We will recheck labs today and hopefully decrease diuretic therapy if tolerated. Continues to avoid sodium. [place] due in Oct 2022. Lasix 40 mg BID, aldactone 50 BID. Interested in Transplant evaluation when appropriate.   Abnormal appearing ampulla on EGD earlier this year: MRI/MRCP with no obvious mass and shows subtle prominence. Monitoring for sign/symptoms of obstruction. At this point as he is newly on anticoagulation, holding off on early interval EGD for direct visualization. Could consider surveillance via MRI/MRCP in next 6 months or so.    PLAN:  Oct 2022 [place] for hepatoma screening CBC, CMP, INR Anticipate titrating diuretics down Consider early interval MRI/MRCP to follow-up on ampulla Continue ETOH abstinence and applauded on this Continue to follow low sodium diet Oct 2022 return  Annitta Needs, PhD, [facility]

## 2020-10-19 NOTE — Patient Instructions (Signed)
Please have blood work done so we can check your kidney and electrolytes. We may be decreasing the fluid pills!  We will see you back in October 2022!  Continue to avoid salt. There is no fluid restriction, so you can drink as much water/liquids as you would like. Avoid adding any extra salt to food and avoid processed, canned foods as this has a lot of salt as well.  I enjoyed seeing you again today! As you know, I value our relationship and want to provide genuine, compassionate, and quality care. I welcome your feedback. If you receive a survey regarding your visit,  I greatly appreciate you taking time to fill this out. See you next time!  Annitta Needs, PhD, ANP-BC [facility]

## 2020-10-28 ENCOUNTER — Telehealth: Payer: Self-pay | Admitting: Internal Medicine

## 2020-10-28 NOTE — Telephone Encounter (Signed)
Returned the pt's call he had turned off his cel phone. Called to [facility] spoke to staff and was advised that Rx went through they don't know why the pt called [place] regarding it. (I advised them to go ahead and fill it for the pt). Call pt back and he still had cell cut off. Called home number LM with his aunt for the pt to go pick up his Rx.

## 2020-10-28 NOTE — Telephone Encounter (Signed)
PATIENT NEEDS HIS PANTOPROZOLE SENT TO [facility] ON [address].  THE PAHRMACY TOLD HIM HE NEEDED TO CONTACT us ABOUT THE REFILL   HE THINKS THEY NEED AUTHORIZATION

## 2021-01-11 ENCOUNTER — Telehealth: Payer: Self-pay | Admitting: Internal Medicine

## 2021-01-11 NOTE — Telephone Encounter (Signed)
Recall sent 

## 2021-01-11 NOTE — Telephone Encounter (Signed)
Recall for ultrasound 

## 2021-01-19 ENCOUNTER — Telehealth: Payer: Self-pay | Admitting: Internal Medicine

## 2021-01-19 DIAGNOSIS — K7030 Alcoholic cirrhosis of liver without ascites: Secondary | ICD-10-CM

## 2021-01-19 NOTE — Telephone Encounter (Signed)
Pt called to schedule his Korea. [phone]

## 2021-01-20 NOTE — Telephone Encounter (Signed)
[place] abd complete scheduled for 02/15/21 at 9:30am, arrive at 9:15am. NPO after midnight before test.  Tried to call pt, no answer, unable to leave voicemail. Appt letter mailed.

## 2021-02-09 ENCOUNTER — Encounter (HOSPITAL_COMMUNITY): Payer: Self-pay | Admitting: *Deleted

## 2021-02-09 ENCOUNTER — Other Ambulatory Visit: Payer: Self-pay

## 2021-02-09 ENCOUNTER — Observation Stay (HOSPITAL_COMMUNITY)
Admission: EM | Admit: 2021-02-09 | Discharge: 2021-02-10 | Disposition: A | Discharge status: Home / Self Care | Payer: Medicaid Other | Attending: Family Medicine | Admitting: Family Medicine

## 2021-02-09 DIAGNOSIS — R5383 Other fatigue: Secondary | ICD-10-CM | POA: Diagnosis present

## 2021-02-09 DIAGNOSIS — D638 Anemia in other chronic diseases classified elsewhere: Secondary | ICD-10-CM | POA: Diagnosis not present

## 2021-02-09 DIAGNOSIS — F17210 Nicotine dependence, cigarettes, uncomplicated: Secondary | ICD-10-CM | POA: Diagnosis not present

## 2021-02-09 DIAGNOSIS — Z20822 Contact with and (suspected) exposure to covid-19: Secondary | ICD-10-CM | POA: Diagnosis not present

## 2021-02-09 DIAGNOSIS — J449 Chronic obstructive pulmonary disease, unspecified: Secondary | ICD-10-CM | POA: Diagnosis not present

## 2021-02-09 DIAGNOSIS — E871 Hypo-osmolality and hyponatremia: Secondary | ICD-10-CM | POA: Diagnosis not present

## 2021-02-09 DIAGNOSIS — Z7901 Long term (current) use of anticoagulants: Secondary | ICD-10-CM | POA: Insufficient documentation

## 2021-02-09 DIAGNOSIS — E876 Hypokalemia: Secondary | ICD-10-CM | POA: Diagnosis not present

## 2021-02-09 DIAGNOSIS — I10 Essential (primary) hypertension: Secondary | ICD-10-CM | POA: Insufficient documentation

## 2021-02-09 DIAGNOSIS — I82492 Acute embolism and thrombosis of other specified deep vein of left lower extremity: Secondary | ICD-10-CM

## 2021-02-09 DIAGNOSIS — I82409 Acute embolism and thrombosis of unspecified deep veins of unspecified lower extremity: Secondary | ICD-10-CM | POA: Diagnosis not present

## 2021-02-09 DIAGNOSIS — Z79899 Other long term (current) drug therapy: Secondary | ICD-10-CM | POA: Diagnosis not present

## 2021-02-09 DIAGNOSIS — K7031 Alcoholic cirrhosis of liver with ascites: Secondary | ICD-10-CM

## 2021-02-09 DIAGNOSIS — K7030 Alcoholic cirrhosis of liver without ascites: Secondary | ICD-10-CM | POA: Diagnosis present

## 2021-02-09 LAB — CBC WITH DIFFERENTIAL/PLATELET
Abs Immature Granulocytes: 0.14 10*3/uL — ABNORMAL HIGH (ref 0.00–0.07)
Absolute Monocytes: 1541 cells/uL — ABNORMAL HIGH (ref 200–950)
Basophils Absolute: 128 cells/uL (ref 0–200)
Basophils Absolute: 0.1 10*3/uL (ref 0.0–0.1)
Basophils Relative: 1.2 %
Basophils Relative: 1 %
Eosinophils Absolute: 32 cells/uL (ref 15–500)
Eosinophils Absolute: 0 10*3/uL (ref 0.0–0.5)
Eosinophils Relative: 0.3 %
Eosinophils Relative: 0 %
HCT: 30.8 % — ABNORMAL LOW (ref 38.5–50.0)
HCT: 25.4 % — ABNORMAL LOW (ref 39.0–52.0)
Hemoglobin: 10.8 g/dL — ABNORMAL LOW (ref 13.2–17.1)
Hemoglobin: 9.3 g/dL — ABNORMAL LOW (ref 13.0–17.0)
Immature Granulocytes: 1 %
Lymphocytes Relative: 22 %
Lymphs Abs: 2151 cells/uL (ref 850–3900)
Lymphs Abs: 2.4 10*3/uL (ref 0.7–4.0)
MCH: 32.1 pg (ref 27.0–33.0)
MCH: 32.7 pg (ref 26.0–34.0)
MCHC: 35.1 g/dL (ref 32.0–36.0)
MCHC: 36.6 g/dL — ABNORMAL HIGH (ref 30.0–36.0)
MCV: 91.7 fL (ref 80.0–100.0)
MCV: 89.4 fL (ref 80.0–100.0)
MPV: 10 fL (ref 7.5–12.5)
Monocytes Absolute: 1.8 10*3/uL — ABNORMAL HIGH (ref 0.1–1.0)
Monocytes Relative: 14.4 %
Monocytes Relative: 16 %
Neutro Abs: 6848 cells/uL (ref 1500–7800)
Neutro Abs: 6.6 10*3/uL (ref 1.7–7.7)
Neutrophils Relative %: 64 %
Neutrophils Relative %: 60 %
Platelets: 180 10*3/uL (ref 140–400)
Platelets: 244 10*3/uL (ref 150–400)
RBC: 3.36 10*6/uL — ABNORMAL LOW (ref 4.20–5.80)
RBC: 2.84 MIL/uL — ABNORMAL LOW (ref 4.22–5.81)
RDW: 14.9 % (ref 11.0–15.0)
RDW: 15.7 % — ABNORMAL HIGH (ref 11.5–15.5)
Total Lymphocyte: 20.1 %
WBC: 10.7 10*3/uL (ref 3.8–10.8)
WBC: 11.1 10*3/uL — ABNORMAL HIGH (ref 4.0–10.5)
nRBC: 0.2 % (ref 0.0–0.2)

## 2021-02-09 LAB — COMPREHENSIVE METABOLIC PANEL
ALT: 21 U/L (ref 0–44)
AST: 44 U/L — ABNORMAL HIGH (ref 15–41)
Albumin: 2.3 g/dL — ABNORMAL LOW (ref 3.5–5.0)
Alkaline Phosphatase: 191 U/L — ABNORMAL HIGH (ref 38–126)
Anion gap: 10 (ref 5–15)
BUN: 30 mg/dL — ABNORMAL HIGH (ref 6–20)
CO2: 32 mmol/L (ref 22–32)
Calcium: 7.6 mg/dL — ABNORMAL LOW (ref 8.9–10.3)
Chloride: 86 mmol/L — ABNORMAL LOW (ref 98–111)
Creatinine, Ser: 1.84 mg/dL — ABNORMAL HIGH (ref 0.61–1.24)
GFR, Estimated: 43 mL/min — ABNORMAL LOW (ref >60)
Glucose, Bld: 103 mg/dL — ABNORMAL HIGH (ref 70–99)
Potassium: 2.2 mmol/L — CL (ref 3.5–5.1)
Sodium: 128 mmol/L — ABNORMAL LOW (ref 135–145)
Total Bilirubin: 1.5 mg/dL — ABNORMAL HIGH (ref 0.3–1.2)
Total Protein: 7 g/dL (ref 6.5–8.1)

## 2021-02-09 LAB — RESP PANEL BY RT-PCR (FLU A&B, COVID) ARPGX2
Influenza A by PCR: NEGATIVE
Influenza B by PCR: NEGATIVE
SARS Coronavirus 2 by RT PCR: NEGATIVE

## 2021-02-09 LAB — PROTIME-INR
INR: 1.2 — ABNORMAL HIGH
Prothrombin Time: 11.9 s — ABNORMAL HIGH (ref 9.0–11.5)

## 2021-02-09 LAB — PHOSPHORUS: Phosphorus: 3.2 mg/dL (ref 2.5–4.6)

## 2021-02-09 LAB — MAGNESIUM: Magnesium: 1.3 mg/dL — ABNORMAL LOW (ref 1.7–2.4)

## 2021-02-09 MED ORDER — MOMETASONE FURO-FORMOTEROL FUM 200-5 MCG/ACT IN AERO
2.0000 | INHALATION_SPRAY | Freq: Two times a day (BID) | RESPIRATORY_TRACT | Status: DC
  Administered 2021-02-10: 2 via RESPIRATORY_TRACT
  Filled 2021-02-09: qty 8.8

## 2021-02-09 MED ORDER — POTASSIUM CHLORIDE IN NACL 40-0.9 MEQ/L-% IV SOLN
INTRAVENOUS | Status: DC
  Filled 2021-02-09: qty 1000

## 2021-02-09 MED ORDER — ENSURE ENLIVE PO LIQD
237.0000 mL | Freq: Two times a day (BID) | ORAL | Status: DC
  Administered 2021-02-10 (×2): 237 mL via ORAL

## 2021-02-09 MED ORDER — SODIUM CHLORIDE 0.9 % IV SOLN: INTRAVENOUS | Status: DC

## 2021-02-09 MED ORDER — APIXABAN 5 MG PO TABS
5.0000 mg | ORAL_TABLET | Freq: Two times a day (BID) | ORAL | Status: DC
  Administered 2021-02-09 – 2021-02-10 (×2): 5 mg via ORAL
  Filled 2021-02-09 (×2): qty 1

## 2021-02-09 MED ORDER — POTASSIUM CHLORIDE 10 MEQ/100ML IV SOLN: 10.0000 meq | INTRAVENOUS | Status: DC

## 2021-02-09 MED ORDER — ALBUTEROL SULFATE HFA 108 (90 BASE) MCG/ACT IN AERS
2.0000 | INHALATION_SPRAY | Freq: Once | RESPIRATORY_TRACT | Status: AC
  Administered 2021-02-09: 2 via RESPIRATORY_TRACT
  Filled 2021-02-09: qty 6.7

## 2021-02-09 MED ORDER — SODIUM CHLORIDE 0.9 % IV BOLUS
500.0000 mL | Freq: Once | INTRAVENOUS | Status: AC
  Administered 2021-02-09: 500 mL via INTRAVENOUS

## 2021-02-09 MED ORDER — MAGNESIUM SULFATE 2 GM/50ML IV SOLN
2.0000 g | Freq: Once | INTRAVENOUS | Status: AC
  Administered 2021-02-09: 2 g via INTRAVENOUS
  Filled 2021-02-09: qty 50

## 2021-02-09 MED ORDER — POTASSIUM CHLORIDE 10 MEQ/100ML IV SOLN
10.0000 meq | INTRAVENOUS | Status: DC
  Administered 2021-02-09: 10 meq via INTRAVENOUS
  Filled 2021-02-09 (×2): qty 100

## 2021-02-09 MED ORDER — PANTOPRAZOLE SODIUM 40 MG PO TBEC
40.0000 mg | DELAYED_RELEASE_TABLET | Freq: Every day | ORAL | Status: DC
  Administered 2021-02-10: 40 mg via ORAL
  Filled 2021-02-09: qty 1

## 2021-02-09 MED ORDER — SPIRONOLACTONE 25 MG PO TABS
50.0000 mg | ORAL_TABLET | Freq: Two times a day (BID) | ORAL | Status: DC
  Administered 2021-02-09 – 2021-02-10 (×2): 50 mg via ORAL
  Filled 2021-02-09 (×2): qty 2

## 2021-02-09 MED ORDER — POTASSIUM CHLORIDE 10 MEQ/100ML IV SOLN
10.0000 meq | INTRAVENOUS | Status: AC
Start: 2021-02-09 — End: 2021-02-10
  Administered 2021-02-09 – 2021-02-10 (×4): 10 meq via INTRAVENOUS
  Filled 2021-02-09 (×2): qty 100

## 2021-02-09 MED ORDER — ATORVASTATIN CALCIUM 20 MG PO TABS
20.0000 mg | ORAL_TABLET | Freq: Every day | ORAL | Status: DC
  Administered 2021-02-10: 20 mg via ORAL
  Filled 2021-02-09: qty 1

## 2021-02-09 MED ORDER — POTASSIUM CHLORIDE CRYS ER 20 MEQ PO TBCR
40.0000 meq | EXTENDED_RELEASE_TABLET | Freq: Once | ORAL | Status: AC
  Administered 2021-02-09: 40 meq via ORAL
  Filled 2021-02-09: qty 2

## 2021-02-09 MED ORDER — POTASSIUM CHLORIDE 10 MEQ/100ML IV SOLN
10.0000 meq | Freq: Once | INTRAVENOUS | Status: AC
  Administered 2021-02-09: 10 meq via INTRAVENOUS
  Filled 2021-02-09: qty 100

## 2021-02-09 NOTE — ED Triage Notes (Signed)
Dr. Josephine Cables office called and reported that pt's K  and NA levels were low.

## 2021-02-09 NOTE — ED Notes (Signed)
Date and time results received: 02/09/21 1737 (use smartphrase ".now" to insert current time)  Test: K Critical Value: 2.2  Name of Provider Notified: Dr Almyra Free  Orders Received? Or Actions Taken?: na

## 2021-02-09 NOTE — ED Provider Notes (Signed)
[facility] EMERGENCY DEPARTMENT Provider Note   CSN: [id number] Arrival date & time: 02/09/21  1505     History No chief complaint on file.   [name] [name] is a 55 y.o. male with a history of hypertension, history of DVT on Eliquis, alcoholic cirrhosis and prior history of hyponatremia and hypokalemia presenting at the request of his PCP secondary to a low potassium and sodium level per blood work performed in his office yesterday.  Patient denies any specific symptoms although does state he has intermittent problems with generalized fatigue and has had a few cramps in his leg muscles over the past several days.  He denies chest pain, palpitations, nausea vomiting or abdominal pain.  He does endorse some shortness of breath along with wheezing as he has run out of his albuterol inhaler.  He has a refill at the pharmacy but has been unable to get it picked up.  He does take potassium, Lasix and spironolactone and denies missing any doses.  The history is provided by the patient.      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   Elevated ferritin 08/26/2020   Hypomagnesemia 08/11/2020   Hypoalbuminemia 08/11/2020   Hypotension 08/11/2020   Anemia of chronic disease 08/11/2020   DVT (deep venous thrombosis) -Lt LL Extensive DVT 75/17/0017   Alcoholic cirrhosis of liver with ascites ([place]) 08/05/2020   COPD (chronic obstructive pulmonary disease) ([place]) 08/05/2020   Hypokalemia 08/05/2020   Hepatic cirrhosis ([place]) 08/04/2020   Edema of left lower extremity 08/04/2020   Loss of weight 07/07/2020   Loss of appetite 07/07/2020   Anasarca 07/07/2020   Heme + stool 01/22/2017   Abnormal LFTs 01/22/2017   Hyperlipidemia 02/08/2015   Cigarette nicotine dependence with  nicotine-induced disorder 02/08/2015   Leukocytosis, unspecified 01/07/2013   Hyponatremia 01/07/2013   Kidney stone 49/44/9675   Ureteral colic 91/63/8466    Past Surgical History:  Procedure Laterality Date   APPENDECTOMY     BIOPSY  07/22/2020   Procedure: BIOPSY;  Surgeon: Daneil Dolin, MD;  [facility];  Service: Endoscopy;;   COLONOSCOPY WITH PROPOFOL N/A 04/18/2017   non-bleeding internal hemorrhoids, two 4-6 mm polyps in descending colon and cecum, pancolonic diverticulosis, single cecal AVM. Tubular adenomas, surveillance in 2023.    ESOPHAGOGASTRODUODENOSCOPY (EGD) WITH PROPOFOL N/A 07/22/2020   normal esophagus, small hiatal hernia, abnormal gastric mucosa, abnormal appearing ampula and periampullary mucosa. Mild chronic gastritis.   FRACTURE SURGERY     left arm   LOWER EXTREMITY VENOGRAPHY N/A 08/10/2020   Procedure: LOWER EXTREMITY VENOGRAPHY;  Surgeon: Waynetta Sandy, MD;  Location: [place] [facility];  Service: Cardiovascular;  Laterality: N/A;   PERIPHERAL VASCULAR BALLOON ANGIOPLASTY Left 08/10/2020   Procedure: PERIPHERAL VASCULAR BALLOON ANGIOPLASTY;  Surgeon: Waynetta Sandy, MD;  Location: [place] [facility];  Service: Cardiovascular;  Laterality: Left;  lower extremity venous   PERIPHERAL VASCULAR THROMBECTOMY N/A 08/10/2020   Procedure: PERIPHERAL VASCULAR THROMBECTOMY;  Surgeon: Waynetta Sandy, MD;  Location: [place] [facility];  Service: Cardiovascular;  Laterality: N/A;   POLYPECTOMY  04/18/2017   Procedure: POLYPECTOMY;  Surgeon: Daneil Dolin, MD;  [facility];  Service:  Endoscopy;;       Family History  Problem Relation Age of Onset   Cancer Father        throat   Diabetes Sister    Colon cancer Neg Hx     Social History   Tobacco Use   Smoking status: Every Day    Packs/day: 0.50    Years: 33.00    Pack years: 16.50    Types: Cigarettes   Smokeless tobacco: Never  Vaping Use   Vaping  Use: Never used  Substance Use Topics   Alcohol use: No    Comment: history of ETOH use 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 33s, no ETOH for about 4 weeks as of 07/07/20   Drug use: No    Home Medications Prior to Admission medications   Medication Sig Start Date End Date Taking? Authorizing Provider  acetaminophen (TYLENOL) 500 MG tablet Take 1,000 mg by mouth every 6 (six) hours as needed.   Yes [provider]  ELIQUIS 5 MG TABS tablet Take 5 mg by mouth 2 (two) times daily. Take 5 mg at 9 am and 5 mg at 9 pm 01/13/21  Yes [provider]  furosemide (LASIX) 40 MG tablet Take 40 mg by mouth 2 (two) times daily. 11/23/20  Yes [provider]  pantoprazole (PROTONIX) 40 MG tablet Take 1 tablet (40 mg total) by mouth daily. Take 30 minutes 07/07/20  Yes Annitta Needs, NP  spironolactone (ALDACTONE) 50 MG tablet Take 50 mg by mouth 2 (two) times daily. 01/07/21  Yes [provider]  albuterol (PROVENTIL HFA) 108 (90 Base) MCG/ACT inhaler INHALE 2 PUFFS BY MOUTH EVERY 6 HOURS AS NEEDED FOR COUGHING, WHEEZING, OR SHORTNESS OF BREATH Patient not taking: No sig reported 06/02/19   Soyla Dryer, PA-C  amLODipine (NORVASC) 10 MG tablet Take 10 mg by mouth daily. Patient not taking: No sig reported 11/22/20   [provider]  APIXABAN Arne Cleveland) VTE STARTER PACK (10MG  AND 5MG ) Take as directed on package: start with two-5mg  tablets twice daily for 7 days. On day 8, switch to one-5mg  tablet twice daily. Patient not taking: Reported on 02/09/2021 08/11/20   Debbe Odea, MD  atorvastatin (LIPITOR) 20 MG tablet Take 1 tablet (20 mg total) by mouth daily. Patient taking differently: Take 20 mg by mouth in the morning. 06/02/19   Soyla Dryer, PA-C  furosemide (LASIX) 20 MG tablet Take 1 tablet (20 mg total) by mouth daily. Patient taking differently: Take 40 mg by mouth in the morning and at bedtime. 07/27/20   Annitta Needs, NP  lisinopril (ZESTRIL) 10 MG tablet Take  10 mg by mouth daily. Patient not taking: Reported on 02/09/2021 09/20/20   [provider]  mometasone-formoterol (DULERA) 200-5 MCG/ACT AERO Inhale 2 puffs into the lungs 2 (two) times daily. Patient taking differently: Inhale 2 puffs into the lungs 2 (two) times daily as needed (wheezing/shortness of breath). 06/02/19   Soyla Dryer, PA-C  nicotine (NICODERM CQ - DOSED IN MG/24 HOURS) 14 mg/24hr patch Place 1 patch (14 mg total) onto the skin daily. Patient not taking: Reported on 02/09/2021 08/12/20   Debbe Odea, MD  nicotine (NICODERM CQ) 7 mg/24hr patch Place 1 patch (7 mg total) onto the skin daily. Start after the 14 mg patches have finished Patient not taking: No sig reported 08/11/20   Debbe Odea, MD  potassium chloride SA (KLOR-CON) 20 MEQ tablet Take 1 tablet (20 mEq total) by mouth 2 (two) times  daily for 3 days. Patient not taking: Reported on 10/19/2020 09/02/20 09/05/20  Annitta Needs, NP  potassium chloride SA (KLOR-CON) 20 MEQ tablet TAKE 1 TABLET(20 MEQ) BY MOUTH TWICE DAILY FOR 4 DAYS Patient not taking: No sig reported 09/24/20   Tarri Abernethy M, PA-C  spironolactone (ALDACTONE) 25 MG tablet Take 50 mg by mouth 2 (two) times daily. Patient not taking: Reported on 02/09/2021 05/13/20   [provider]    Allergies    Patient has no known allergies.  Review of Systems   Review of Systems  Constitutional:  Positive for fatigue. Negative for fever.  HENT:  Negative for congestion and sore throat.   Eyes: Negative.   Respiratory:  Positive for shortness of breath and wheezing. Negative for chest tightness.   Cardiovascular:  Negative for chest pain, palpitations and leg swelling.  Gastrointestinal:  Negative for abdominal pain and nausea.  Genitourinary: Negative.   Musculoskeletal:  Negative for arthralgias, joint swelling and neck pain.  Skin: Negative.  Negative for rash and wound.  Neurological:  Negative for dizziness, weakness,  light-headedness, numbness and headaches.  Psychiatric/Behavioral: Negative.    All other systems reviewed and are negative.  Physical Exam Updated Vital Signs BP 108/87   Pulse 87   Temp 98.5 F (36.9 C) (Oral)   Resp 18   Ht 6' (1.829 m)   Wt 67.1 kg   SpO2 100%   BMI 20.06 kg/m   Physical Exam Vitals and nursing note reviewed.  Constitutional:      Appearance: He is well-developed.  HENT:     Head: Normocephalic and atraumatic.  Eyes:     Conjunctiva/sclera: Conjunctivae normal.  Cardiovascular:     Rate and Rhythm: Normal rate and regular rhythm.     Heart sounds: Normal heart sounds.  Pulmonary:     Effort: Pulmonary effort is normal.     Breath sounds: Wheezing present.     Comments: Expiratory wheeze throughout all lung fields with prolonged expirations. Abdominal:     General: Bowel sounds are normal.     Palpations: Abdomen is soft.     Tenderness: There is no abdominal tenderness.  Musculoskeletal:        General: Normal range of motion.     Cervical back: Normal range of motion.     Right lower leg: No edema.     Left lower leg: No edema.     Comments: Wearing compression stockings  Skin:    General: Skin is warm and dry.  Neurological:     Mental Status: He is alert.    ED Results / Procedures / Treatments   Labs (all labs ordered are listed, but only abnormal results are displayed) Labs Reviewed  COMPREHENSIVE METABOLIC PANEL - Abnormal; Notable for the following components:      Result Value   Sodium 128 (*)    Potassium 2.2 (*)    Chloride 86 (*)    Glucose, Bld 103 (*)    BUN 30 (*)    Creatinine, Ser 1.84 (*)    Calcium 7.6 (*)    Albumin 2.3 (*)    AST 44 (*)    Alkaline Phosphatase 191 (*)    Total Bilirubin 1.5 (*)    GFR, Estimated 43 (*)    All other components within normal limits  CBC WITH DIFFERENTIAL/PLATELET - Abnormal; Notable for the following components:   WBC 11.1 (*)    RBC 2.84 (*)    Hemoglobin 9.3 (*)  HCT  25.4 (*)    MCHC 36.6 (*)    RDW 15.7 (*)    Monocytes Absolute 1.8 (*)    Abs Immature Granulocytes 0.14 (*)    All other components within normal limits  MAGNESIUM - Abnormal; Notable for the following components:   Magnesium 1.3 (*)    All other components within normal limits  RESP PANEL BY RT-PCR (FLU A&B, COVID) [id number]    EKG EKG Interpretation  Date/Time:  Wednesday February 09 2021 19:30:48 EDT Ventricular Rate:  86 PR Interval:  142 QRS Duration: 95 QT Interval:  399 QTC Calculation: 478 R Axis:   33 Text Interpretation: Sinus rhythm Atrial premature complex Borderline prolonged QT interval Baseline wander in lead(s) I III aVL Confirmed by Thamas Jaegers ([id number]) on 02/09/2021 7:53:03 PM  Radiology No results found.  Procedures Procedures   Medications Ordered in ED Medications  magnesium sulfate IVPB 2 g 50 mL (2 g Intravenous New Bag/Given 02/09/21 1917)  sodium chloride 0.9 % bolus 500 mL (has no administration in time range)  potassium chloride 10 mEq in 100 mL IVPB (10 mEq Intravenous New Bag/Given 02/09/21 1902)  potassium chloride SA (KLOR-CON) CR tablet 40 mEq (40 mEq Oral Given 02/09/21 1902)  albuterol (VENTOLIN HFA) 108 (90 Base) MCG/ACT inhaler 2 puff (2 puffs Inhalation Given 02/09/21 1933)    ED Course  I have reviewed the triage vital signs and the nursing notes.  Pertinent labs & imaging results that were available during my care of the patient were reviewed by me and considered in my medical decision making (see chart for details).    MDM Rules/Calculators/A&P                           Patient presenting with hypokalemia and hyponatremia, also has a low magnesium as well.  Hyponatremia at is mild at 129, potassium level is severe at 2.2.  He was given a 500 cc bolus of normal saline to help address the hyponatremia, he was also given both p.o. and IV potassium, magnesium also ordered as well.  Patient will need admission for complete replacement  of his potassium.  Discussed with Dr. [facility] who accepts pt for admission. Final Clinical Impression(s) / ED Diagnoses Final diagnoses:  Hypokalemia  Hyponatremia  Hypomagnesemia    Rx / DC Orders ED Discharge Orders     None        Landis Martins 02/09/21 2005    Luna Fuse, MD 02/16/21 [phone]

## 2021-02-09 NOTE — H&P (Signed)
[facility] H&P   Patient Demographics:    [name] [name], is a 55 y.o. male  MRN: [id number]   DOB - [birth year]  Admit Date - 02/09/2021  Outpatient Primary MD for the patient is Rosita Fire, MD  Referring MD/NP/PA: PA Idol  Patient coming from: home  No chief complaint on file.  Sent by PCP for abnormal labs    HPI:    [name] [name]  is a 55 y.o. male, with PMH significant for alcoholic liver disease (last EtOH intake was in February) Hypertension, Hyperlipidemia, Tobacco abuse, COPD , DVT on Eliquis, patient presents to ED by request of his PCP secondary to abnormal labs, patient had labs performed yesterday at PCP office, which was significant for low potassium, low magnesium, and low sodium, patient reports generalized weakness and fatigue over the last few days, does report some few cramps in his legs over the last few days, he denies chest pain, nausea, vomiting, palpitation or abdominal pain, but he does endorse diarrhea a week ago, has totally resolved, reports he is compliant with his medications which include Lasix, Aldactone, and reported he is not on any potassium supplements anymore as it was stopped by his PCP. - in ED patient work-up significant for sodium of 128, potassium of 2.2, creatinine of 1.84, from baseline of 1.38, magnesium of 1.3, hemoglobin at baseline around 9.3, [facility] hospitalist consulted to admit.    Review of systems:    In addition to the HPI above,  No Fever-chills, reports generalized weakness and fatigue. No Headache, No changes with Vision or hearing, No problems swallowing food or Liquids, No Chest pain, Cough or Shortness of Breath, No Abdominal pain, No Nausea or Vommitting, Bowel movements are regular,(report he had diarrhea 1 week ago, but it had totally resolved) No Blood in stool or Urine, No dysuria, No new skin rashes or bruises, No new joints  pains-aches,  No new weakness, tingling, numbness in any extremity, No recent weight gain or loss, No polyuria, polydypsia or polyphagia, No significant Mental Stressors.  A full 10 point Review of Systems was done, except as stated above, all other Review of Systems were negative.   With Past History of the following :    Past Medical History:  Diagnosis Date   Asthma    Dyspnea    High cholesterol    History of kidney stones    Hypertension    Kidney stones       Past Surgical History:  Procedure Laterality Date   APPENDECTOMY     BIOPSY  07/22/2020   Procedure: BIOPSY;  Surgeon: Daneil Dolin, MD;  Location: [facility];  Service: Endoscopy;;   COLONOSCOPY WITH PROPOFOL N/A 04/18/2017   non-bleeding internal hemorrhoids, two 4-6 mm polyps in descending colon and cecum, pancolonic diverticulosis, single cecal AVM. Tubular adenomas, surveillance in 2023.    ESOPHAGOGASTRODUODENOSCOPY (EGD) WITH PROPOFOL N/A 07/22/2020  normal esophagus, small hiatal hernia, abnormal gastric mucosa, abnormal appearing ampula and periampullary mucosa. Mild chronic gastritis.   FRACTURE SURGERY     left arm   LOWER EXTREMITY VENOGRAPHY N/A 08/10/2020   Procedure: LOWER EXTREMITY VENOGRAPHY;  Surgeon: Waynetta Sandy, MD;  Location: [facility];  Service: Cardiovascular;  Laterality: N/A;   PERIPHERAL VASCULAR BALLOON ANGIOPLASTY Left 08/10/2020   Procedure: PERIPHERAL VASCULAR BALLOON ANGIOPLASTY;  Surgeon: Waynetta Sandy, MD;  Location: [facility];  Service: Cardiovascular;  Laterality: Left;  lower extremity venous   PERIPHERAL VASCULAR THROMBECTOMY N/A 08/10/2020   Procedure: PERIPHERAL VASCULAR THROMBECTOMY;  Surgeon: Waynetta Sandy, MD;  Location: [place] [facility];  Service: Cardiovascular;  Laterality: N/A;   POLYPECTOMY  04/18/2017   Procedure: POLYPECTOMY;  Surgeon: Daneil Dolin, MD;  Location: [facility];  Service: Endoscopy;;       Social History:     Social History   Tobacco Use   Smoking status: Every Day    Packs/day: 0.50    Years: 33.00    Pack years: 16.50    Types: Cigarettes   Smokeless tobacco: Never  Substance Use Topics   Alcohol use: No    Comment: history of ETOH use 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 45s, no ETOH for about 4 weeks as of 07/07/20       Family History :     Family History  Problem Relation Age of Onset   Cancer Father        throat   Diabetes Sister    Colon cancer Neg Hx      Home Medications:   Prior to Admission medications   Medication Sig Start Date End Date Taking? Authorizing Provider  acetaminophen (TYLENOL) 500 MG tablet Take 1,000 mg by mouth every 6 (six) hours as needed.   Yes [provider]  ELIQUIS 5 MG TABS tablet Take 5 mg by mouth 2 (two) times daily. Take 5 mg at 9 am and 5 mg at 9 pm 01/13/21  Yes [provider]  furosemide (LASIX) 40 MG tablet Take 40 mg by mouth 2 (two) times daily. 11/23/20  Yes [provider]  pantoprazole (PROTONIX) 40 MG tablet Take 1 tablet (40 mg total) by mouth daily. Take 30 minutes 07/07/20  Yes Annitta Needs, NP  spironolactone (ALDACTONE) 50 MG tablet Take 50 mg by mouth 2 (two) times daily. 01/07/21  Yes [provider]  albuterol (PROVENTIL HFA) 108 (90 Base) MCG/ACT inhaler INHALE 2 PUFFS BY MOUTH EVERY 6 HOURS AS NEEDED FOR COUGHING, WHEEZING, OR SHORTNESS OF BREATH Patient not taking: No sig reported 06/02/19   Soyla Dryer, PA-C  amLODipine (NORVASC) 10 MG tablet Take 10 mg by mouth daily. Patient not taking: No sig reported 11/22/20   [provider]  APIXABAN Arne Cleveland) VTE STARTER PACK (10MG  AND 5MG ) Take as directed on package: start with two-5mg  tablets twice daily for 7 days. On day 8, switch to one-5mg  tablet twice daily. Patient not taking: Reported on 02/09/2021 08/11/20   Debbe Odea, MD  atorvastatin (LIPITOR) 20 MG tablet Take 1 tablet (20 mg total) by mouth  daily. Patient taking differently: Take 20 mg by mouth in the morning. 06/02/19   Soyla Dryer, PA-C  furosemide (LASIX) 20 MG tablet Take 1 tablet (20 mg total) by mouth daily. Patient taking differently: Take 40 mg by mouth in the morning and at bedtime. 07/27/20   Annitta Needs, NP  lisinopril (  ZESTRIL) 10 MG tablet Take 10 mg by mouth daily. Patient not taking: Reported on 02/09/2021 09/20/20   [provider]  mometasone-formoterol (DULERA) 200-5 MCG/ACT AERO Inhale 2 puffs into the lungs 2 (two) times daily. Patient taking differently: Inhale 2 puffs into the lungs 2 (two) times daily as needed (wheezing/shortness of breath). 06/02/19   Soyla Dryer, PA-C  nicotine (NICODERM CQ - DOSED IN MG/24 HOURS) 14 mg/24hr patch Place 1 patch (14 mg total) onto the skin daily. Patient not taking: Reported on 02/09/2021 08/12/20   Debbe Odea, MD  nicotine (NICODERM CQ) 7 mg/24hr patch Place 1 patch (7 mg total) onto the skin daily. Start after the 14 mg patches have finished Patient not taking: No sig reported 08/11/20   Debbe Odea, MD  potassium chloride SA (KLOR-CON) 20 MEQ tablet Take 1 tablet (20 mEq total) by mouth 2 (two) times daily for 3 days. Patient not taking: Reported on 10/19/2020 09/02/20 09/05/20  Annitta Needs, NP  potassium chloride SA (KLOR-CON) 20 MEQ tablet TAKE 1 TABLET(20 MEQ) BY MOUTH TWICE DAILY FOR 4 DAYS Patient not taking: No sig reported 09/24/20   Tarri Abernethy M, PA-C  spironolactone (ALDACTONE) 25 MG tablet Take 50 mg by mouth 2 (two) times daily. Patient not taking: Reported on 02/09/2021 05/13/20   [provider]     Allergies:    No Known Allergies   Physical Exam:   Vitals  Blood pressure 108/87, pulse 87, temperature 98.5 F (36.9 C), temperature source Oral, resp. rate 18, height 6' (1.829 m), weight 67.1 kg, SpO2 100 %.   1. General developed male, laying in bed in no apparent distress.  2. Normal affect and insight, Not  Suicidal or Homicidal, Awake Alert, Oriented X 3.  3. No F.N deficits, ALL C.Nerves Intact, Strength 5/5 all 4 extremities, Sensation intact all 4 extremities, Plantars down going.  4. Ears and Eyes appear Normal, Conjunctivae clear, PERRLA. Moist Oral Mucosa.  5. Supple Neck, No JVD, No cervical lymphadenopathy appriciated, No Carotid Bruits.  6. Symmetrical Chest wall movement, Good air movement bilaterally, CTAB.  7. RRR, No Gallops, Rubs or Murmurs, No Parasternal Heave.  8. Positive Bowel Sounds, Abdomen Soft, No tenderness, No organomegaly appriciated,No rebound -guarding or rigidity.  9.  No Cyanosis, Normal Skin Turgor, No Skin Rash or Bruise.  10. Good muscle tone,  joints appear normal , no effusions, Normal ROM.  11. No Palpable Lymph Nodes in Neck or Axillae    Data Review:    CBC Recent Labs  Lab 02/08/21 1118 02/09/21 1616  WBC 10.7 11.1*  HGB 10.8* 9.3*  HCT 30.8* 25.4*  PLT 180 244  MCV 91.7 89.4  MCH 32.1 32.7  MCHC 35.1 36.6*  RDW 14.9 15.7*  LYMPHSABS 2,151 2.4  MONOABS  --  1.8*  EOSABS 32 0.0  BASOSABS 128 0.1   ------------------------------------------------------------------------------------------------------------------  Chemistries  Recent Labs  Lab 02/09/21 1616  NA 128*  K 2.2*  CL 86*  CO2 32  GLUCOSE 103*  BUN 30*  CREATININE 1.84*  CALCIUM 7.6*  MG 1.3*  AST 44*  ALT 21  ALKPHOS 191*  BILITOT 1.5*   ------------------------------------------------------------------------------------------------------------------ estimated creatinine clearance is 43.1 mL/min (A) (by C-G formula based on SCr of 1.84 mg/dL (H)). ------------------------------------------------------------------------------------------------------------------ No results for input(s): TSH, T4TOTAL, T3FREE, THYROIDAB in the last 72 hours.  Invalid input(s): FREET3  Coagulation profile Recent Labs  Lab 02/08/21 1118  INR 1.2*    ------------------------------------------------------------------------------------------------------------------- No results for input(s):  DDIMER in the last 72 hours. -------------------------------------------------------------------------------------------------------------------  Cardiac Enzymes No results for input(s): CKMB, TROPONINI, MYOGLOBIN in the last 168 hours.  Invalid input(s): CK ------------------------------------------------------------------------------------------------------------------ No results found for: BNP   ---------------------------------------------------------------------------------------------------------------  Urinalysis    Component Value Date/Time   COLORURINE YELLOW 11/02/2013 0816   APPEARANCEUR CLOUDY (A) 11/02/2013 0816   LABSPEC 1.025 11/02/2013 0816   PHURINE 6.5 11/02/2013 0816   GLUCOSEU NEGATIVE 11/02/2013 0816   HGBUR NEGATIVE 11/02/2013 0816   BILIRUBINUR NEGATIVE 11/02/2013 0816   KETONESUR NEGATIVE 11/02/2013 0816   PROTEINUR 100 (A) 11/02/2013 0816   UROBILINOGEN 0.2 11/02/2013 0816   NITRITE POSITIVE (A) 11/02/2013 0816   LEUKOCYTESUR NEGATIVE 11/02/2013 0816    ----------------------------------------------------------------------------------------------------------------   Imaging Results:    No results found.  EKG Vent. rate 86 BPM PR interval 142 ms QRS duration 95 ms QT/QTcB 399/478 ms P-R-T axes 70 33 49 Sinus rhythm Atrial premature complex Borderline prolonged QT interval Baseline wander in lead(s) I III aVL    Assessment & Plan:    Active Problems:   Hyponatremia   DVT (deep venous thrombosis) -Lt LL Extensive DVT   Alcoholic cirrhosis of liver with ascites (HCC)   COPD (chronic obstructive pulmonary disease) (HCC)   Hypokalemia   Hypomagnesemia   Anemia of chronic disease   Hypokalemia/hypomagnesemia hyponatremia -Patient presents with multiple electrolyte abnormalities, he does endorse  recent diarrheal illness, but this has resolved 1 week ago, most likely contributing to these electrolyte abnormalities. -Plan monitor on telemetry given his severe hypokalemia and significant hyponatremia -Electrolyte will be repleted aggressively overnight, will recheck level in a.m. -Received IV normal saline as well for his hyponatremia, I will hold his Lasix possibly need to be held on discharge -We will continue with Aldactone given hyperkalemia -Check phosphorus level and replete if needed.  AKI on CKD stage II -With worsening renal function, most likely volume depletion and dehydration as he was still using his Lasix while he had diarrheal illness last week -Patient had electrophoresis which was sent by hematology last May(09/13/2020), significantly elevated IgA and IgG, but no M spike, as well elevated free chains level, results are significantly abnormal, will have morning team discusse with hematology service regarding these findings.  DVT -Continue with Eliquis  COPD -Continue with home medications  History of alcoholic liver disease  -Denies any alcohol use since March of this year, no ascites, no edema, continue with Aldactone,  Anemia of chronic kidney disease -Hemoglobin at baseline.   DVT Prophylaxis on Eliquis   AM Labs Ordered, also please review Full Orders  Family Communication: Admission, patients condition and plan of care including tests being ordered have been discussed with the patient who indicate understanding and agree with the plan and Code Status.  Code Status Full  Likely DC to  home  Condition GUARDED    Consults called: none  Admission status: observation    Time spent in minutes : 60 minutes   Phillips Climes M.D on 02/09/2021 at 8:31 PM   [facility] - Office  [phone]

## 2021-02-09 NOTE — ED Triage Notes (Signed)
Pt states he had blood work done yesterday and was instructed by PCP to get to the emergency room. Pt denies any pain.

## 2021-02-10 DIAGNOSIS — J449 Chronic obstructive pulmonary disease, unspecified: Secondary | ICD-10-CM | POA: Diagnosis not present

## 2021-02-10 DIAGNOSIS — K7031 Alcoholic cirrhosis of liver with ascites: Secondary | ICD-10-CM | POA: Diagnosis not present

## 2021-02-10 DIAGNOSIS — E876 Hypokalemia: Secondary | ICD-10-CM | POA: Diagnosis not present

## 2021-02-10 LAB — BASIC METABOLIC PANEL
Anion gap: 5 (ref 5–15)
Anion gap: 4 — ABNORMAL LOW (ref 5–15)
BUN: 23 mg/dL — ABNORMAL HIGH (ref 6–20)
BUN: 21 mg/dL — ABNORMAL HIGH (ref 6–20)
CO2: 26 mmol/L (ref 22–32)
CO2: 27 mmol/L (ref 22–32)
Calcium: 7.2 mg/dL — ABNORMAL LOW (ref 8.9–10.3)
Calcium: 7.3 mg/dL — ABNORMAL LOW (ref 8.9–10.3)
Chloride: 99 mmol/L (ref 98–111)
Chloride: 102 mmol/L (ref 98–111)
Creatinine, Ser: 1.35 mg/dL — ABNORMAL HIGH (ref 0.61–1.24)
Creatinine, Ser: 1.23 mg/dL (ref 0.61–1.24)
GFR, Estimated: >60 mL/min (ref >60)
GFR, Estimated: >60 mL/min (ref >60)
Glucose, Bld: 113 mg/dL — ABNORMAL HIGH (ref 70–99)
Glucose, Bld: 135 mg/dL — ABNORMAL HIGH (ref 70–99)
Potassium: 3.2 mmol/L — ABNORMAL LOW (ref 3.5–5.1)
Potassium: 3.7 mmol/L (ref 3.5–5.1)
Sodium: 130 mmol/L — ABNORMAL LOW (ref 135–145)
Sodium: 133 mmol/L — ABNORMAL LOW (ref 135–145)

## 2021-02-10 LAB — CBC
HCT: 21 % — ABNORMAL LOW (ref 39.0–52.0)
HCT: 23.3 % — ABNORMAL LOW (ref 39.0–52.0)
Hemoglobin: 7.5 g/dL — ABNORMAL LOW (ref 13.0–17.0)
Hemoglobin: 8.4 g/dL — ABNORMAL LOW (ref 13.0–17.0)
MCH: 32.2 pg (ref 26.0–34.0)
MCH: 33.2 pg (ref 26.0–34.0)
MCHC: 35.7 g/dL (ref 30.0–36.0)
MCHC: 36.1 g/dL — ABNORMAL HIGH (ref 30.0–36.0)
MCV: 90.1 fL (ref 80.0–100.0)
MCV: 92.1 fL (ref 80.0–100.0)
Platelets: 215 10*3/uL (ref 150–400)
Platelets: 239 10*3/uL (ref 150–400)
RBC: 2.33 MIL/uL — ABNORMAL LOW (ref 4.22–5.81)
RBC: 2.53 MIL/uL — ABNORMAL LOW (ref 4.22–5.81)
RDW: 16 % — ABNORMAL HIGH (ref 11.5–15.5)
RDW: 16.5 % — ABNORMAL HIGH (ref 11.5–15.5)
WBC: 9.2 10*3/uL (ref 4.0–10.5)
WBC: 9.3 10*3/uL (ref 4.0–10.5)
nRBC: 0 % (ref 0.0–0.2)
nRBC: 0 % (ref 0.0–0.2)

## 2021-02-10 LAB — MAGNESIUM: Magnesium: 1.8 mg/dL (ref 1.7–2.4)

## 2021-02-10 MED ORDER — ALBUTEROL SULFATE HFA 108 (90 BASE) MCG/ACT IN AERS: INHALATION_SPRAY | RESPIRATORY_TRACT | 1 refills | Status: DC

## 2021-02-10 MED ORDER — APIXABAN 5 MG PO TABS
5.0000 mg | ORAL_TABLET | Freq: Two times a day (BID) | ORAL | 5 refills | Status: DC
Start: 2021-02-10 — End: 2021-02-10

## 2021-02-10 MED ORDER — DULERA 200-5 MCG/ACT IN AERO: 2.0000 | INHALATION_SPRAY | Freq: Two times a day (BID) | RESPIRATORY_TRACT | 3 refills | Status: DC

## 2021-02-10 MED ORDER — NICOTINE 14 MG/24HR TD PT24: 14.0000 mg | MEDICATED_PATCH | Freq: Every day | TRANSDERMAL | 0 refills | Status: DC

## 2021-02-10 MED ORDER — POTASSIUM CHLORIDE CRYS ER 20 MEQ PO TBCR
40.0000 meq | EXTENDED_RELEASE_TABLET | ORAL | Status: AC
Start: 2021-02-10 — End: 2021-02-10
  Administered 2021-02-10 (×2): 40 meq via ORAL
  Filled 2021-02-10 (×2): qty 2

## 2021-02-10 MED ORDER — PANTOPRAZOLE SODIUM 40 MG PO TBEC: 40.0000 mg | DELAYED_RELEASE_TABLET | Freq: Every day | ORAL | 1 refills | Status: DC

## 2021-02-10 NOTE — Discharge Summary (Signed)
[name] [name], is a 55 y.o. male  DOB May 03, 1965  MRN [id number].  Admission date:  02/09/2021  Admitting Physician  Albertine Patricia, MD  Discharge Date:  02/10/2021   Primary MD  Rosita Fire, MD  Recommendations for primary care physician for things to follow:   1)Please Avoid ibuprofen/Advil/Aleve/Motrin/Goody Powders/Naproxen/BC powders/Meloxicam/Diclofenac/Indomethacin and other Nonsteroidal anti-inflammatory medications as these will make you more likely to bleed and can cause stomach ulcers, can also cause Kidney problems.   2)follow up with Rosita Fire, MD in 1 week for repeat CBC and Repeat BMP Blood test   3)Please  discuss Eliquis use with Dr Rosita Fire, MD   4)--Patient had electrophoresis which was sent by hematology last May(09/13/2020), significantly elevated IgA and IgG, but no M spike, as well elevated free chains level, results are significantly abnormal ---Follow up with Dr Lesly Dukes   Admission Diagnosis  Hypokalemia [E87.6] Hypomagnesemia [E83.42] Hyponatremia [E87.1]   Discharge Diagnosis  Hypokalemia [E87.6] Hypomagnesemia [E83.42] Hyponatremia [E87.1]    Active Problems:   DVT (deep venous thrombosis) -Lt LL Extensive DVT   Alcoholic cirrhosis of liver with ascites (HCC)   COPD (chronic obstructive pulmonary disease) (HCC)   Hypokalemia   Hyponatremia   Hypomagnesemia   Anemia of chronic disease      Past Medical History:  Diagnosis Date   Asthma    Dyspnea    High cholesterol    History of kidney stones    Hypertension    Kidney stones     Past Surgical History:  Procedure Laterality Date   APPENDECTOMY     BIOPSY  07/22/2020   Procedure: BIOPSY;  Surgeon: Daneil Dolin, MD;  Location: [facility];  Service: Endoscopy;;   COLONOSCOPY WITH PROPOFOL N/A 04/18/2017   non-bleeding internal hemorrhoids, two 4-6 mm polyps in descending  colon and cecum, pancolonic diverticulosis, single cecal AVM. Tubular adenomas, surveillance in 2023.    ESOPHAGOGASTRODUODENOSCOPY (EGD) WITH PROPOFOL N/A 07/22/2020   normal esophagus, small hiatal hernia, abnormal gastric mucosa, abnormal appearing ampula and periampullary mucosa. Mild chronic gastritis.   FRACTURE SURGERY     left arm   LOWER EXTREMITY VENOGRAPHY N/A 08/10/2020   Procedure: LOWER EXTREMITY VENOGRAPHY;  Surgeon: Waynetta Sandy, MD;  Location: [facility];  Service: Cardiovascular;  Laterality: N/A;   PERIPHERAL VASCULAR BALLOON ANGIOPLASTY Left 08/10/2020   Procedure: PERIPHERAL VASCULAR BALLOON ANGIOPLASTY;  Surgeon: Waynetta Sandy, MD;  Location: [facility];  Service: Cardiovascular;  Laterality: Left;  lower extremity venous   PERIPHERAL VASCULAR THROMBECTOMY N/A 08/10/2020   Procedure: PERIPHERAL VASCULAR THROMBECTOMY;  Surgeon: Waynetta Sandy, MD;  Location: [facility];  Service: Cardiovascular;  Laterality: N/A;   POLYPECTOMY  04/18/2017   Procedure: POLYPECTOMY;  Surgeon: Daneil Dolin, MD;  Location: [facility];  Service: Endoscopy;;     HPI  from the history and physical done on the day of admission:   [name] [name]  is a 55 y.o. male, with PMH significant for  alcoholic liver disease (last EtOH intake was in February) Hypertension, Hyperlipidemia, Tobacco abuse, COPD , DVT on Eliquis, patient presents to ED by request of his PCP secondary to abnormal labs, patient had labs performed yesterday at PCP office, which was significant for low potassium, low magnesium, and low sodium, patient reports generalized weakness and fatigue over the last few days, does report some few cramps in his legs over the last few days, he denies chest pain, nausea, vomiting, palpitation or abdominal pain, but he does endorse diarrhea a week ago, has totally resolved, reports he is compliant with his medications which include Lasix,  Aldactone, and reported he is not on any potassium supplements anymore as it was stopped by his PCP. - in ED patient work-up significant for sodium of 128, potassium of 2.2, creatinine of 1.84, from baseline of 1.38, magnesium of 1.3, hemoglobin at baseline around 9.3, [facility] hospitalist consulted to admit.     Hospital Course:     1)Hypokalemia/hypomagnesemia hyponatremia-- -Sodium is up to 133 from 128 -Potassium is up to 3.7 from 2.2 -Chloride is up to 102 from 86  2)AKI--improved with hydration -BUN is down to 21 from 30 -Creatinine is down to 1.23 from 1.84 on admission --Patient had electrophoresis which was sent by hematology last May(09/13/2020), significantly elevated IgA and IgG, but no M spike, as well elevated free chains level, results are significantly abnormal follow up with Dr Lesly Dukes  3) acute on chronic anemia--- hemoglobin dropped initially from IV fluids, currently stable above 8 -Repeat CBC with PCP within a week  4)H/o DVT-- ??  Patient is not sure if he has completed his Eliquis treatment, he is advised to follow-up with PCP to discuss whether he needs to go back on Eliquis and not currently hemoglobin borderline if he restarts Eliquis H&H will have to be monitored  5)COPD-- c/n Bronchiodilators,   6)H/o Alcoholic Liver Disease- quit Etoh in March 2022  Discharge Condition: stable   Follow UP--Dr Legrand Rams   Consults obtained - N/a  Diet and Activity recommendation:  As advised  Discharge Instructions    Discharge Instructions     Call MD for:  persistant dizziness or light-headedness   Complete by: As directed    Call MD for:  persistant nausea and vomiting   Complete by: As directed    Call MD for:  temperature >100.4   Complete by: As directed    Diet - low sodium heart healthy   Complete by: As directed    Discharge instructions   Complete by: As directed    1)Please Avoid ibuprofen/Advil/Aleve/Motrin/Goody Powders/Naproxen/BC  powders/Meloxicam/Diclofenac/Indomethacin and other Nonsteroidal anti-inflammatory medications as these will make you more likely to bleed and can cause stomach ulcers, can also cause Kidney problems.   2)follow up with Rosita Fire, MD in 1 week for repeat CBC and Repeat BMP Blood test   3)Please  discuss Eliquis use with Dr Rosita Fire, MD   4)--Patient had electrophoresis which was sent by hematology last May(09/13/2020), significantly elevated IgA and IgG, but no M spike, as well elevated free chains level, results are significantly abnormal ---Follow up with Dr Lesly Dukes   Increase activity slowly   Complete by: As directed        Discharge Medications     Allergies as of 02/10/2021   No Known Allergies      Medication List     STOP taking these medications    amLODipine 10 MG tablet Commonly known as: NORVASC   Eliquis 5  MG Tabs tablet Generic drug: apixaban   Eliquis DVT/PE Starter Pack Generic drug: Apixaban Starter Pack (10mg  and 5mg )   furosemide 20 MG tablet Commonly known as: Lasix   furosemide 40 MG tablet Commonly known as: LASIX   lisinopril 10 MG tablet Commonly known as: ZESTRIL   potassium chloride SA 20 MEQ tablet Commonly known as: KLOR-CON   spironolactone 25 MG tablet Commonly known as: ALDACTONE   spironolactone 50 MG tablet Commonly known as: ALDACTONE       TAKE these medications    acetaminophen 500 MG tablet Commonly known as: TYLENOL Take 1,000 mg by mouth every 6 (six) hours as needed.   albuterol 108 (90 Base) MCG/ACT inhaler Commonly known as: Proventil HFA INHALE 2 PUFFS BY MOUTH EVERY 6 HOURS AS NEEDED FOR COUGHING, WHEEZING, OR SHORTNESS OF BREATH   atorvastatin 20 MG tablet Commonly known as: LIPITOR Take 1 tablet (20 mg total) by mouth daily. What changed: when to take this   Dulera 200-5 MCG/ACT Aero Generic drug: mometasone-formoterol Inhale 2 puffs into the lungs 2 (two) times daily. What  changed:  when to take this reasons to take this   nicotine 14 mg/24hr patch Commonly known as: NICODERM CQ - dosed in mg/24 hours Place 1 patch (14 mg total) onto the skin daily. What changed: Another medication with the same name was removed. Continue taking this medication, and follow the directions you see here.   pantoprazole 40 MG tablet Commonly known as: Protonix Take 1 tablet (40 mg total) by mouth daily. What changed: additional instructions        Major procedures and Radiology Reports - PLEASE review detailed and final reports for all details, in brief -    No results found.  Micro Results   Recent Results (from the past 240 hour(s))  Resp Panel by RT-PCR (Flu A&B, Covid) Nasopharyngeal Swab     Status: None   Collection Time: 02/09/21  7:16 PM   Specimen: Nasopharyngeal Swab; Nasopharyngeal(NP) swabs in vial transport medium  Result Value Ref Range Status   SARS Coronavirus 2 by RT PCR NEGATIVE NEGATIVE Final    Comment: (NOTE) SARS-CoV-2 target nucleic acids are NOT DETECTED.  The SARS-CoV-2 RNA is generally detectable in upper respiratory specimens during the acute phase of infection. The lowest concentration of SARS-CoV-2 viral copies this assay can detect is 138 copies/mL. A negative result does not preclude SARS-Cov-2 infection and should not be used as the sole basis for treatment or other patient management decisions. A negative result may occur with  improper specimen collection/handling, submission of specimen other than nasopharyngeal swab, presence of viral mutation(s) within the areas targeted by this assay, and inadequate number of viral copies(<138 copies/mL). A negative result must be combined with clinical observations, patient history, and epidemiological information. The expected result is Negative.  Fact Sheet for Patients:  EntrepreneurPulse.com.au  Fact Sheet for Healthcare Providers:   IncredibleEmployment.be  This test is no t yet approved or cleared by the [place] FDA and  has been authorized for detection and/or diagnosis of SARS-CoV-2 by FDA under an Emergency Use Authorization (EUA). This EUA will remain  in effect (meaning this test can be used) for the duration of the COVID-19 declaration under Section 564(b)(1) of the Act, 21 U.S.C.section 360bbb-3(b)(1), unless the authorization is terminated  or revoked sooner.       Influenza A by PCR NEGATIVE NEGATIVE Final   Influenza B by PCR NEGATIVE NEGATIVE Final    Comment: (NOTE)  The Xpert Xpress SARS-CoV-2/FLU/RSV plus assay is intended as an aid in the diagnosis of influenza from Nasopharyngeal swab specimens and should not be used as a sole basis for treatment. Nasal washings and aspirates are unacceptable for Xpert Xpress SARS-CoV-2/FLU/RSV testing.  Fact Sheet for Patients: EntrepreneurPulse.com.au  Fact Sheet for Healthcare Providers: IncredibleEmployment.be  This test is not yet approved or cleared by the [place] FDA and has been authorized for detection and/or diagnosis of SARS-CoV-2 by FDA under an Emergency Use Authorization (EUA). This EUA will remain in effect (meaning this test can be used) for the duration of the COVID-19 declaration under Section 564(b)(1) of the Act, 21 U.S.C. section 360bbb-3(b)(1), unless the authorization is terminated or revoked.  Performed at [facility], [address], [place], [place] [address]     Today   Subjective   [name] [name] today has no new complaints  No fever  Or chills   No Nausea, Vomiting or Diarrhea (diarrhea has resolved)         Patient has been seen and examined prior to discharge   Objective   Blood pressure 108/71, pulse 86, temperature 98.4 F (36.9 C), resp. rate 17, height 6' (1.829 m), weight 67.1 kg, SpO2 100 %.   Intake/Output Summary (Last 24 hours) at  02/10/2021 1701 Last data filed at 02/10/2021 1452 Gross per 24 hour  Intake 1424.23 ml  Output 700 ml  Net 724.23 ml    Exam Gen:- Awake Alert, no acute distress  HEENT:- [place].AT, No sclera icterus Neck-Supple Neck,No JVD,.  Lungs-  CTAB , good air movement bilaterally  CV- S1, S2 normal, regular Abd-  +ve B.Sounds, Abd Soft, No tenderness,    Extremity/Skin:- No  edema,   good pulses Psych-affect is appropriate, oriented x3 Neuro-no new focal deficits, no tremors    Data Review   CBC w Diff:  Lab Results  Component Value Date   WBC 9.3 02/10/2021   HGB 8.4 (L) 02/10/2021   HCT 23.3 (L) 02/10/2021   PLT 239 02/10/2021   LYMPHOPCT 22 02/09/2021   MONOPCT 16 02/09/2021   EOSPCT 0 02/09/2021   BASOPCT 1 02/09/2021    CMP:  Lab Results  Component Value Date   NA 133 (L) 02/10/2021   K 3.7 02/10/2021   CL 102 02/10/2021   CO2 27 02/10/2021   BUN 21 (H) 02/10/2021   CREATININE 1.23 02/10/2021   CREATININE 1.38 (H) 09/17/2020   PROT 7.0 02/09/2021   ALBUMIN 2.3 (L) 02/09/2021   BILITOT 1.5 (H) 02/09/2021   ALKPHOS 191 (H) 02/09/2021   AST 44 (H) 02/09/2021   ALT 21 02/09/2021    Total Discharge time is about 33 minutes  Roxan Hockey M.D on 02/10/2021 at 5:01 PM  Go to [email or web address] -  for contact info  [facility] - Office  [phone]

## 2021-02-10 NOTE — Discharge Instructions (Addendum)
1)Please Avoid ibuprofen/Advil/Aleve/Motrin/Goody Powders/Naproxen/BC powders/Meloxicam/Diclofenac/Indomethacin and other Nonsteroidal anti-inflammatory medications as these will make you more likely to bleed and can cause stomach ulcers, can also cause Kidney problems.   2)follow up with Rosita Fire, MD in 1 week for repeat CBC and Repeat BMP Blood test   3)Please  discuss Eliquis use with Dr Rosita Fire, MD   4)--Patient had electrophoresis which was sent by hematology last May(09/13/2020), significantly elevated IgA and IgG, but no M spike, as well elevated free chains level, results are significantly abnormal ---Follow up with Dr Lesly Dukes

## 2021-02-10 NOTE — Progress Notes (Signed)
Pt discharged via WC to POV with personal belongings in his possession.

## 2021-02-10 NOTE — Progress Notes (Signed)
Pt advised that MD has ordered repeat lab work at this time to reevaluate hgb level. Explained to pt that discharge is on hold for now until lab work is resulted and evaluate by MD. Pt states understanding but is concerned that if he cannot discharge today he will not have a ride home tomorrow. Will notify MD of pt concern.

## 2021-02-15 ENCOUNTER — Ambulatory Visit (HOSPITAL_COMMUNITY)
Admission: RE | Admit: 2021-02-15 | Discharge: 2021-02-15 | Disposition: A | Discharge status: Home / Self Care | Payer: Medicaid Other | Source: Ambulatory Visit | Attending: Gastroenterology | Admitting: Gastroenterology

## 2021-02-15 ENCOUNTER — Other Ambulatory Visit: Payer: Self-pay

## 2021-02-15 DIAGNOSIS — K7030 Alcoholic cirrhosis of liver without ascites: Secondary | ICD-10-CM | POA: Insufficient documentation

## 2021-02-15 IMAGING — US US ABDOMEN COMPLETE
1 series · 13 of 25 positions shown · non-contrast
Comparison: MRI abdomen [DATE]. ultrasound abdomen [DATE].

CLINICAL DATA: Cirrhosis, hepatoma screening.

EXAM:
ABDOMEN ULTRASOUND COMPLETE

[Series 1: us abdomen complete · 13 of 111 slices shown]
[im 1/111]
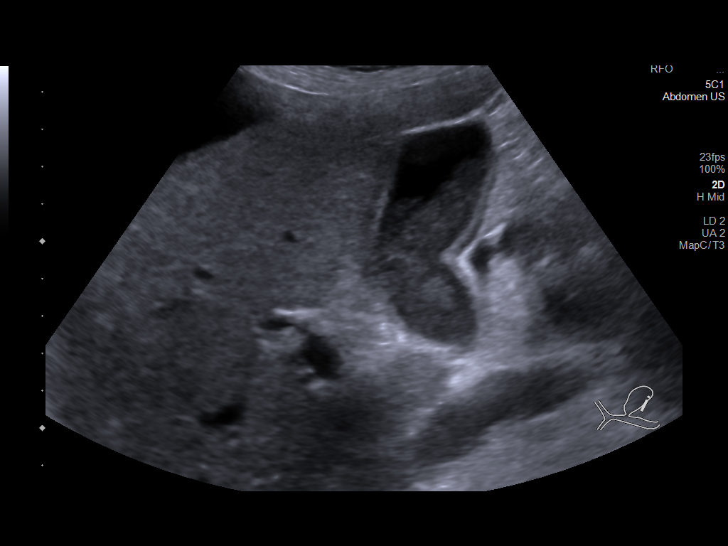
[im 10/111]
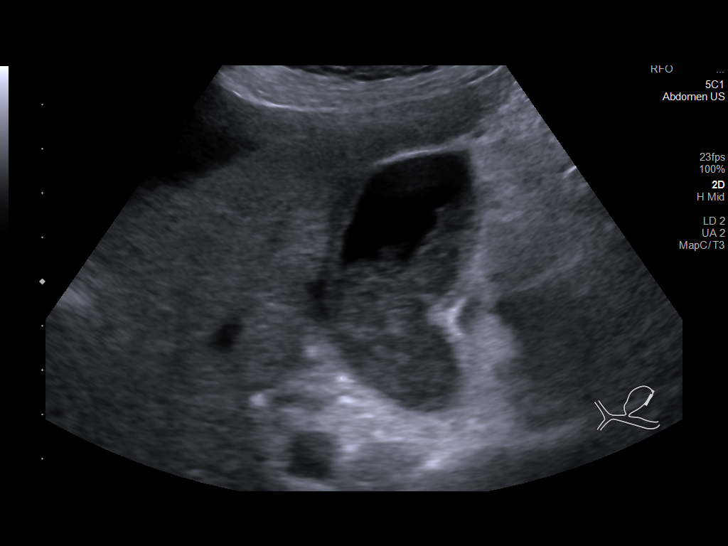
[im 19/111]
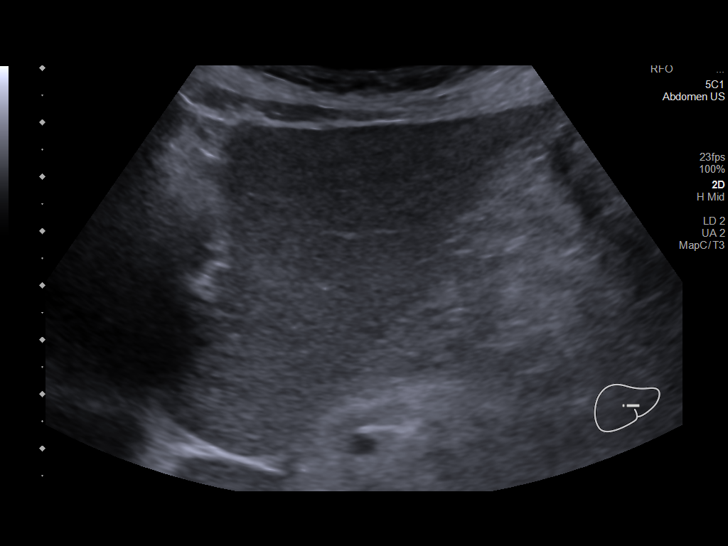
[im 28/111]
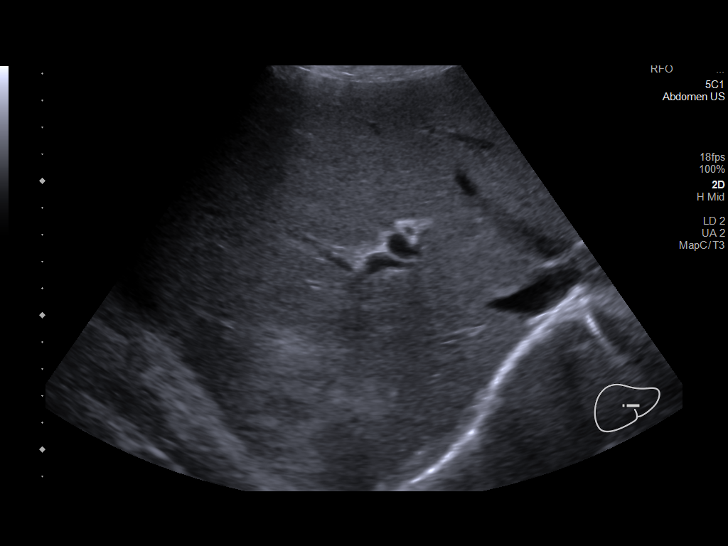
[im 37/111]
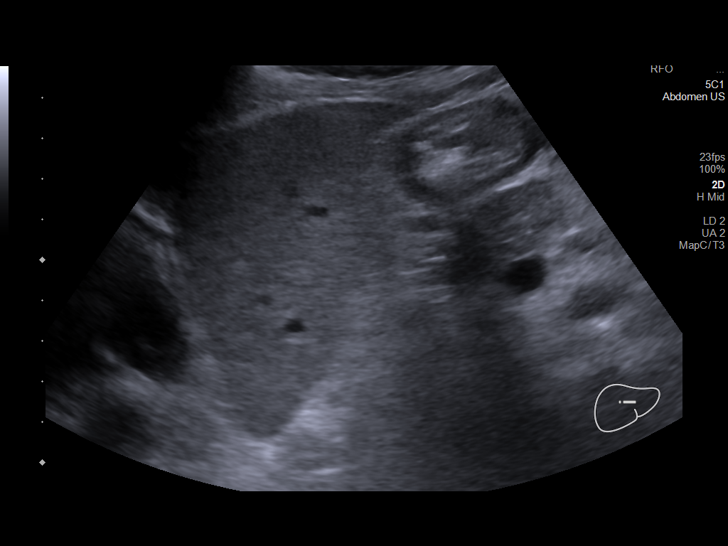
[im 46/111]
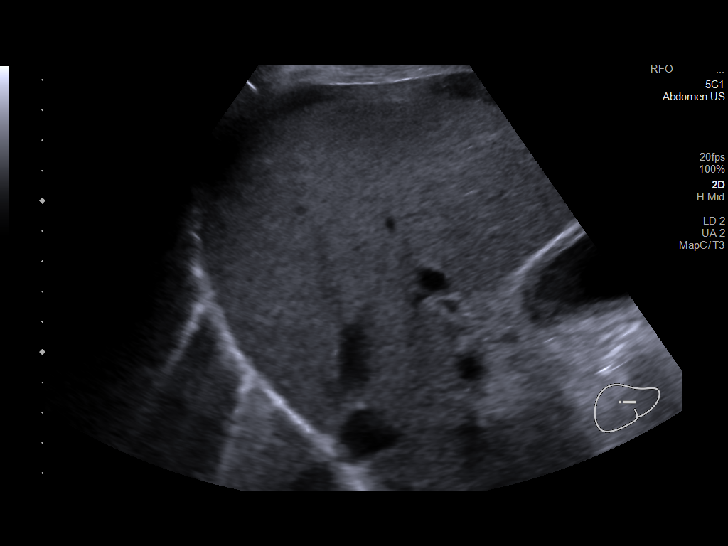
[im 56/111]
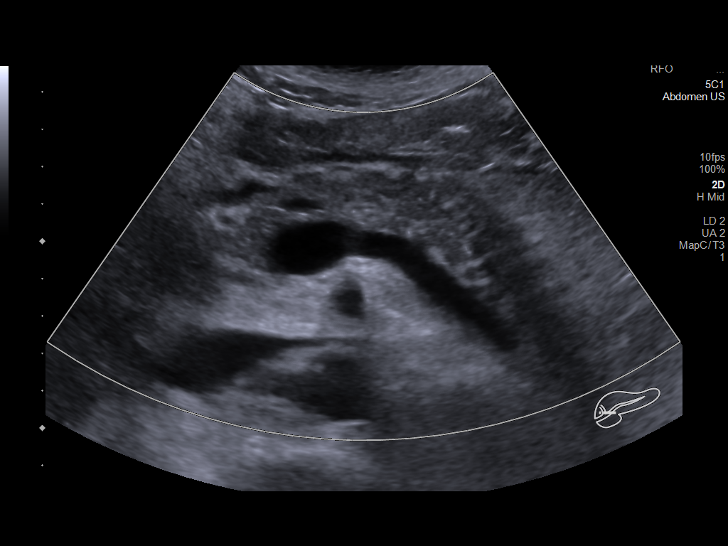
[im 65/111]
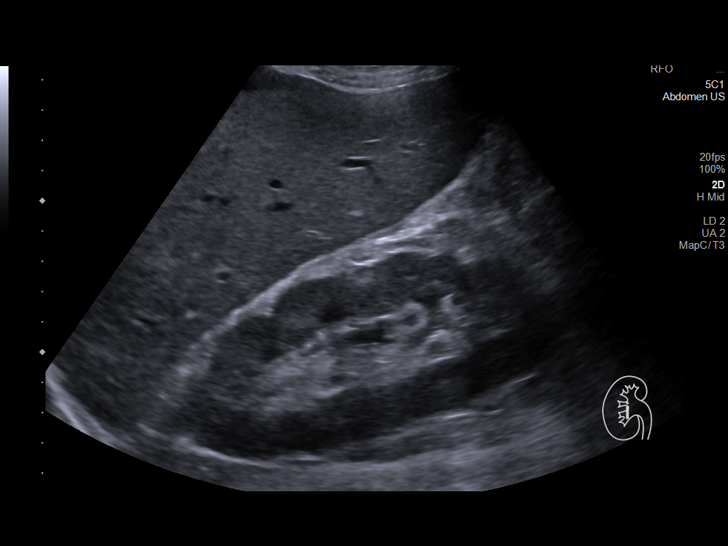
[im 74/111]
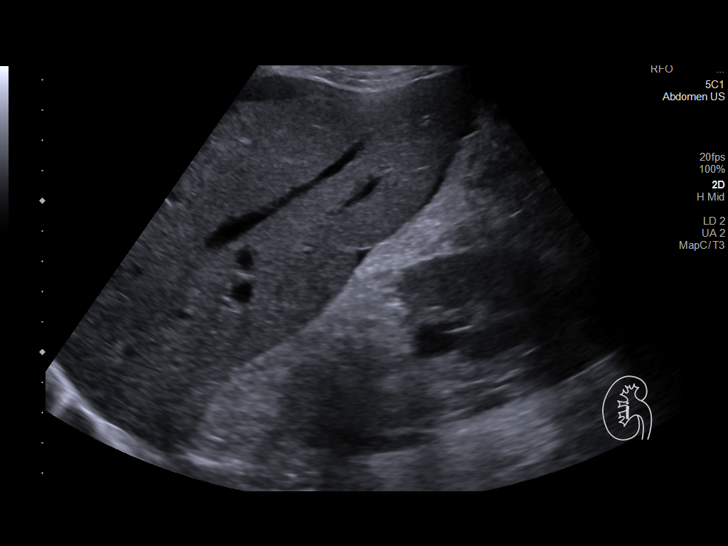
[im 83/111]
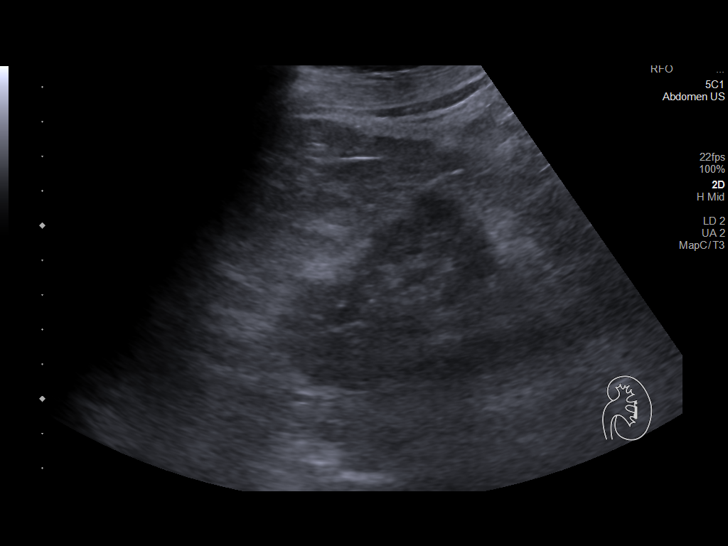
[im 92/111]
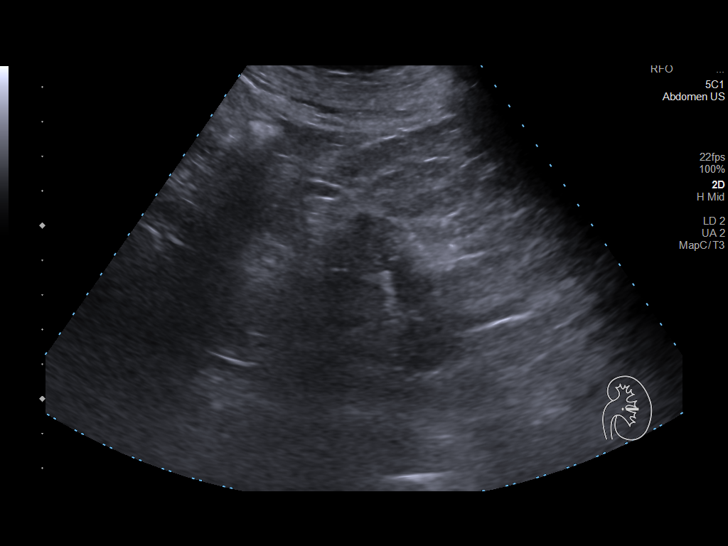
[im 101/111]
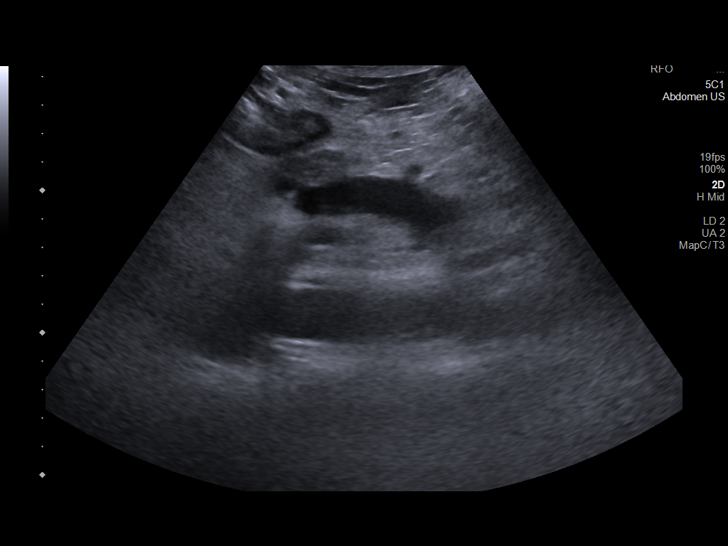
[im 111/111]
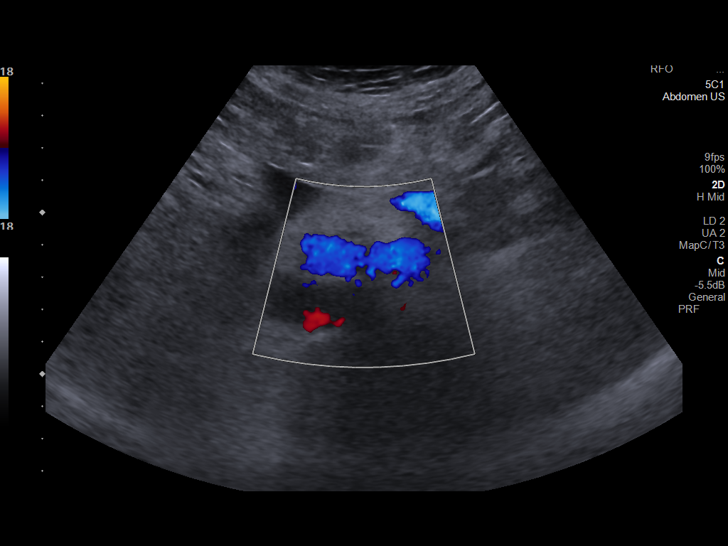

[13 of 25 positions shown; findings below may reference images not displayed]

FINDINGS: Gallbladder: Gallbladder sludge is present. No gallstones
identified. Gallbladder wall is mildly thickened measuring 8 mm.
Sonographic Murphy negative per sonographer.

Common bile duct: Diameter: 4.8 mm.

Liver: No focal lesion identified. Heterogeneous increased
echogenicity throughout the liver. Portal vein is patent on color
Doppler imaging with normal direction of blood flow towards the
liver.

IVC: No abnormality visualized.

Pancreas: Visualized portion unremarkable.

Spleen: Size and appearance within normal limits.

Right Kidney: Length: 13.5 cm. Echogenicity within normal limits. No
mass or hydronephrosis visualized.

Left Kidney: Length: 6.8 cm. There is some renal cortical thinning.
Increased echogenicity. No significant interval change. No mass or
hydronephrosis visualized.

Abdominal aorta: No aneurysm visualized.

Other findings: Small volume ascites.
IMPRESSION: 1. Heterogeneous echogenic liver likely related to diffuse
hepatocellular disease. No focal liver lesion.
2. Gallbladder sludge. Mild gallbladder wall thickening can be seen
in the setting of ascites, but acute or chronic cholecystitis is not
excluded. No biliary ductal dilatation. Negative Murphy sign.
3. Small volume ascites.
4. Left renal atrophy is unchanged.

## 2021-02-16 ENCOUNTER — Telehealth: Payer: Self-pay

## 2021-02-16 NOTE — Telephone Encounter (Addendum)
Did find notation about discontinuation of Eliquis, discussed with Dr. Donzetta Matters - this is fine -patient should take daily low dose aspirin. Was able to reach patient and confirm.  Patient left VM reporting that he went to the ED recently and some people have told him to stop his Eliquis and he should he? Unable to find documentation in ED notes about d/c Eliquis and from his last o/v he is to continue it indefinitely. Tried to call back, utr or leave vm.

## 2021-02-23 ENCOUNTER — Other Ambulatory Visit: Payer: Self-pay

## 2021-02-23 ENCOUNTER — Encounter: Payer: Self-pay | Admitting: Gastroenterology

## 2021-02-23 ENCOUNTER — Ambulatory Visit (INDEPENDENT_AMBULATORY_CARE_PROVIDER_SITE_OTHER): Payer: Medicaid Other | Admitting: Gastroenterology

## 2021-02-23 VITALS — BP 111/70 | HR 93 | Temp 96.6°F | Ht 72.0 in | Wt 165.5 lb

## 2021-02-23 DIAGNOSIS — K7031 Alcoholic cirrhosis of liver with ascites: Secondary | ICD-10-CM

## 2021-02-23 DIAGNOSIS — R7989 Other specified abnormal findings of blood chemistry: Secondary | ICD-10-CM

## 2021-02-23 DIAGNOSIS — K7030 Alcoholic cirrhosis of liver without ascites: Secondary | ICD-10-CM

## 2021-02-23 NOTE — Progress Notes (Signed)
Referring Provider: Rosita Fire, MD Primary Care Physician:  Rosita Fire, MD Primary GI: Dr. Gala Romney   Chief Complaint  Patient presents with   Follow-up    HPI:   [name] [name] is a 55 y.o. male presenting today with a history of alcoholic liver disease, Colonoscopy on file from 2018 with tubular adenomas and surveillance due in 2023.  Followed by Hematology for elevated ferritin. Recent EGD with normal esophagus, small hiatal hernia, abnormal gastric mucosa, abnormal appearing ampula and periampullary mucosa. Mild chronic gastritis. MRI/MRCP with subtle prominence of ampulla without well-defined mass. Pancreas divisum. US abdomen on file from Oct 2022.   He was inpatient April 2022 after our office performed duplex ultrasound and found to have extensive LLE DVT. He underwent mechanical thrombectomy of left external iliac, left common femoral and left femoral veins by Dr. Donzetta Matters on 08/10/20. He is off Eliquis and now should be on low dose aspirin.    Lasix 40, aldactone 50 daily. Mild lower extremity edema intermittently. No abdominal pain or distension. No N/V/D. No changes in bowel habits. Denies confusion or mental status changes. No overt GI bleeding. Desires liver transplant evaluation when appropriate. Needs to make follow-up with Hematology.   Inpatient recently with dehydration. Hgb drifting down. Will recheck labs today.   Past Medical History:  Diagnosis Date   Asthma    Dyspnea    High cholesterol    History of kidney stones    Hypertension    Kidney stones     Past Surgical History:  Procedure Laterality Date   APPENDECTOMY     BIOPSY  07/22/2020   Procedure: BIOPSY;  Surgeon: Daneil Dolin, MD;  Location: [facility];  Service: Endoscopy;;   COLONOSCOPY WITH PROPOFOL N/A 04/18/2017   non-bleeding internal hemorrhoids, two 4-6 mm polyps in descending colon and cecum, pancolonic diverticulosis, single cecal AVM. Tubular adenomas, surveillance in 2023.     ESOPHAGOGASTRODUODENOSCOPY (EGD) WITH PROPOFOL N/A 07/22/2020   normal esophagus, small hiatal hernia, abnormal gastric mucosa, abnormal appearing ampula and periampullary mucosa. Mild chronic gastritis.   FRACTURE SURGERY     left arm   LOWER EXTREMITY VENOGRAPHY N/A 08/10/2020   Procedure: LOWER EXTREMITY VENOGRAPHY;  Surgeon: Waynetta Sandy, MD;  Location: [facility];  Service: Cardiovascular;  Laterality: N/A;   PERIPHERAL VASCULAR BALLOON ANGIOPLASTY Left 08/10/2020   Procedure: PERIPHERAL VASCULAR BALLOON ANGIOPLASTY;  Surgeon: Waynetta Sandy, MD;  Location: [facility];  Service: Cardiovascular;  Laterality: Left;  lower extremity venous   PERIPHERAL VASCULAR THROMBECTOMY N/A 08/10/2020   Procedure: PERIPHERAL VASCULAR THROMBECTOMY;  Surgeon: Waynetta Sandy, MD;  Location: [facility];  Service: Cardiovascular;  Laterality: N/A;   POLYPECTOMY  04/18/2017   Procedure: POLYPECTOMY;  Surgeon: Daneil Dolin, MD;  Location: [facility];  Service: Endoscopy;;    Current Outpatient Medications  Medication Sig Dispense Refill   acetaminophen (TYLENOL) 500 MG tablet Take 1,000 mg by mouth every 6 (six) hours as needed.     albuterol (PROVENTIL HFA) 108 (90 Base) MCG/ACT inhaler INHALE 2 PUFFS BY MOUTH EVERY 6 HOURS AS NEEDED FOR COUGHING, WHEEZING, OR SHORTNESS OF BREATH 18 g 1   amlodipine-atorvastatin (CADUET) 10-10 MG tablet Take 1 tablet by mouth daily.     furosemide (LASIX) 40 MG tablet Take 40 mg by mouth.     mometasone-formoterol (DULERA) 200-5 MCG/ACT AERO Inhale 2 puffs into the lungs 2 (two) times daily. 13 g 3  nicotine (NICODERM CQ - DOSED IN MG/24 HOURS) 14 mg/24hr patch Place 1 patch (14 mg total) onto the skin daily. 14 patch 0   pantoprazole (PROTONIX) 40 MG tablet Take 1 tablet (40 mg total) by mouth daily. 30 tablet 1   spironolactone (ALDACTONE) 50 MG tablet Take 50 mg by mouth daily.     atorvastatin (LIPITOR) 20 MG  tablet Take 1 tablet (20 mg total) by mouth daily. (Patient not taking: Reported on 02/23/2021) 90 tablet 2   No current facility-administered medications for this visit.    Allergies as of 02/23/2021   (No Known Allergies)    Family History  Problem Relation Age of Onset   Cancer Father        throat   Diabetes Sister    Colon cancer Neg Hx     Social History   Socioeconomic History   Marital status: Single    Spouse name: Not on file   Number of children: Not on file   Years of education: Not on file   Highest education level: Not on file  Occupational History   Not on file  Tobacco Use   Smoking status: Every Day    Packs/day: 0.50    Years: 33.00    Pack years: 16.50    Types: Cigarettes   Smokeless tobacco: Never  Vaping Use   Vaping Use: Never used  Substance and Sexual Activity   Alcohol use: No    Comment: history of ETOH use 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 70s, no ETOH for about 4 weeks as of 07/07/20   Drug use: No   Sexual activity: Yes    Birth control/protection: None  Other Topics Concern   Not on file  Social History Narrative   Not on file   Social Determinants of Health   Financial Resource Strain: High Risk   Difficulty of Paying Living Expenses: Very hard  Food Insecurity: No Food Insecurity   Worried About Charity fundraiser in the Last Year: Never true   Ran Out of Food in the Last Year: Never true  Transportation Needs: No Transportation Needs   Lack of Transportation (Medical): No   Lack of Transportation (Non-Medical): No  Physical Activity: Sufficiently Active   Days of Exercise per Week: 7 days   Minutes of Exercise per Session: 30 min  Stress: No Stress Concern Present   Feeling of Stress : Only a little  Social Connections: Moderately Isolated   Frequency of Communication with Friends and Family: More than three times a week   Frequency of Social Gatherings with Friends and Family: Once a week   Attends Religious Services:  More than 4 times per year   Active Member of [employer] or Organizations: No   Attends Archivist Meetings: Never   Marital Status: Never married    Review of Systems: Gen: Denies fever, chills, anorexia. Denies fatigue, weakness, weight loss.  CV: Denies chest pain, palpitations, syncope, peripheral edema, and claudication. Resp: Denies dyspnea at rest, cough, wheezing, coughing up blood, and pleurisy. GI: see HPI Derm: Denies rash, itching, dry skin Psych: Denies depression, anxiety, memory loss, confusion. No homicidal or suicidal ideation.  Heme: Denies bruising, bleeding, and enlarged lymph nodes.  Physical Exam: BP 111/70   Pulse 93   Temp (!) 96.6 F (35.9 C) (Temporal)   Ht 6' (1.829 m)   Wt 165 lb 8 oz (75.1 kg) Comment: weighted again  BMI 22.45 kg/m  General:   Alert  and oriented. No distress noted. Pleasant and cooperative.  Head:  Normocephalic and atraumatic. Eyes:  Conjuctiva clear without scleral icterus. Mouth:  mask in place Abdomen:  +BS, soft, non-tender and non-distended. No rebound or guarding. No HSM or masses noted. Msk:  Symmetrical without gross deformities. Normal posture. Extremities:  With 1+ edema lower extremities  Neurologic:  Alert and  oriented x4 Psych:  Alert and cooperative. Normal mood and affect.  ASSESSMENT: [name] [name] is a 55 y.o. male presenting today with a history of alcoholic liver disease, Colonoscopy on file from 2018 with tubular adenomas and surveillance due in 2023.  Followed by Hematology for elevated ferritin. Recent EGD March 2022 with normal esophagus, small hiatal hernia, abnormal gastric mucosa, abnormal appearing ampula and periampullary mucosa. Mild chronic gastritis. MRI/MRCP with subtle prominence of ampulla without well-defined mass. Pancreas divisum. US abdomen on file from Oct 2022.   Cirrhosis fairly well compensated at this point. Remains on Lasix 40 and aldactone 50 mg daily. This may need to be adjusted in  light of hypokalemia intermittently. Will recheck labs today.   Anemia: chronic disease. Recheck CBC today. Inpatient Hgb 7.5 recently. No overt GI bleeding. Will also refer back to Hematology due to history of elevated ferritin.    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 Gala Romney next best steps. MRI/MRCP on file from April 2022.     PLAN:  CBC, CMP, INR today [place] in 6 months Applauded on continued ETOH cessation Desires liver transplant evaluation when appropriate. Check MELD today 6 month return Will discuss further evaluation of abnormal ampulla with Dr. Fanny Dance, PhD, [facility]

## 2021-02-23 NOTE — Patient Instructions (Signed)
Please have blood work done.  No changes to medications yet until we review blood work!  Remember to follow a low salt diet. I have added a handout here.  I am talking with Dr. Gala Romney about further imaging!  We will see you in 6 months!  I enjoyed seeing you again today! As you know, I value our relationship and want to provide genuine, compassionate, and quality care. I welcome your feedback. If you receive a survey regarding your visit,  I greatly appreciate you taking time to fill this out. See you next time!  Annitta Needs, PhD, ANP-BC [facility]

## 2021-02-25 LAB — COMPLETE METABOLIC PANEL WITH GFR
AG Ratio: 0.6 (calc) — ABNORMAL LOW (ref 1.0–2.5)
ALT: 13 U/L (ref 9–46)
AST: 23 U/L (ref 10–35)
Albumin: 2.4 g/dL — ABNORMAL LOW (ref 3.6–5.1)
Alkaline phosphatase (APISO): 109 U/L (ref 35–144)
BUN: 9 mg/dL (ref 7–25)
CO2: 26 mmol/L (ref 20–32)
Calcium: 7.8 mg/dL — ABNORMAL LOW (ref 8.6–10.3)
Chloride: 107 mmol/L (ref 98–110)
Creat: 1.11 mg/dL (ref 0.70–1.30)
Globulin: 4 g/dL (calc) — ABNORMAL HIGH (ref 1.9–3.7)
Glucose, Bld: 79 mg/dL (ref 65–99)
Potassium: 3.2 mmol/L — ABNORMAL LOW (ref 3.5–5.3)
Sodium: 141 mmol/L (ref 135–146)
Total Bilirubin: 0.7 mg/dL (ref 0.2–1.2)
Total Protein: 6.4 g/dL (ref 6.1–8.1)
eGFR: 78 mL/min/{1.73_m2} (ref >=60)

## 2021-02-25 LAB — CBC WITH DIFFERENTIAL/PLATELET
Absolute Monocytes: 1082 cells/uL — ABNORMAL HIGH (ref 200–950)
Basophils Absolute: 98 cells/uL (ref 0–200)
Basophils Relative: 0.8 %
Eosinophils Absolute: 111 cells/uL (ref 15–500)
Eosinophils Relative: 0.9 %
HCT: 27.5 % — ABNORMAL LOW (ref 38.5–50.0)
Hemoglobin: 9 g/dL — ABNORMAL LOW (ref 13.2–17.1)
Lymphs Abs: 2312 cells/uL (ref 850–3900)
MCH: 31.3 pg (ref 27.0–33.0)
MCHC: 32.7 g/dL (ref 32.0–36.0)
MCV: 95.5 fL (ref 80.0–100.0)
MPV: 9.6 fL (ref 7.5–12.5)
Monocytes Relative: 8.8 %
Neutro Abs: 8696 cells/uL — ABNORMAL HIGH (ref 1500–7800)
Neutrophils Relative %: 70.7 %
Platelets: 434 10*3/uL — ABNORMAL HIGH (ref 140–400)
RBC: 2.88 10*6/uL — ABNORMAL LOW (ref 4.20–5.80)
RDW: 13.9 % (ref 11.0–15.0)
Total Lymphocyte: 18.8 %
WBC: 12.3 10*3/uL — ABNORMAL HIGH (ref 3.8–10.8)

## 2021-02-25 LAB — PROTIME-INR
INR: 1.1
Prothrombin Time: 11.5 s (ref 9.0–11.5)

## 2021-02-28 ENCOUNTER — Other Ambulatory Visit (HOSPITAL_COMMUNITY): Payer: Self-pay | Admitting: *Deleted

## 2021-02-28 DIAGNOSIS — D649 Anemia, unspecified: Secondary | ICD-10-CM

## 2021-03-01 ENCOUNTER — Telehealth (HOSPITAL_COMMUNITY): Payer: Self-pay | Admitting: *Deleted

## 2021-03-01 ENCOUNTER — Inpatient Hospital Stay (HOSPITAL_COMMUNITY): Payer: Medicaid Other | Attending: Hematology

## 2021-03-01 ENCOUNTER — Other Ambulatory Visit (HOSPITAL_COMMUNITY): Payer: Self-pay | Admitting: Physician Assistant

## 2021-03-01 ENCOUNTER — Other Ambulatory Visit: Payer: Self-pay

## 2021-03-01 DIAGNOSIS — R768 Other specified abnormal immunological findings in serum: Secondary | ICD-10-CM | POA: Insufficient documentation

## 2021-03-01 DIAGNOSIS — I82412 Acute embolism and thrombosis of left femoral vein: Secondary | ICD-10-CM | POA: Diagnosis not present

## 2021-03-01 DIAGNOSIS — N1831 Chronic kidney disease, stage 3a: Secondary | ICD-10-CM | POA: Diagnosis not present

## 2021-03-01 DIAGNOSIS — I129 Hypertensive chronic kidney disease with stage 1 through stage 4 chronic kidney disease, or unspecified chronic kidney disease: Secondary | ICD-10-CM | POA: Diagnosis not present

## 2021-03-01 DIAGNOSIS — R7989 Other specified abnormal findings of blood chemistry: Secondary | ICD-10-CM | POA: Insufficient documentation

## 2021-03-01 DIAGNOSIS — I82422 Acute embolism and thrombosis of left iliac vein: Secondary | ICD-10-CM | POA: Insufficient documentation

## 2021-03-01 DIAGNOSIS — D649 Anemia, unspecified: Secondary | ICD-10-CM | POA: Insufficient documentation

## 2021-03-01 DIAGNOSIS — E559 Vitamin D deficiency, unspecified: Secondary | ICD-10-CM | POA: Insufficient documentation

## 2021-03-01 DIAGNOSIS — E876 Hypokalemia: Secondary | ICD-10-CM

## 2021-03-01 LAB — COMPREHENSIVE METABOLIC PANEL
ALT: 11 U/L (ref 0–44)
AST: 22 U/L (ref 15–41)
Albumin: 2 g/dL — ABNORMAL LOW (ref 3.5–5.0)
Alkaline Phosphatase: 94 U/L (ref 38–126)
Anion gap: 6 (ref 5–15)
BUN: 11 mg/dL (ref 6–20)
CO2: 23 mmol/L (ref 22–32)
Calcium: 7.4 mg/dL — ABNORMAL LOW (ref 8.9–10.3)
Chloride: 111 mmol/L (ref 98–111)
Creatinine, Ser: 1.29 mg/dL — ABNORMAL HIGH (ref 0.61–1.24)
GFR, Estimated: >60 mL/min (ref >60)
Glucose, Bld: 97 mg/dL (ref 70–99)
Potassium: 2.7 mmol/L — CL (ref 3.5–5.1)
Sodium: 140 mmol/L (ref 135–145)
Total Bilirubin: 0.7 mg/dL (ref 0.3–1.2)
Total Protein: 6.7 g/dL (ref 6.5–8.1)

## 2021-03-01 LAB — CBC WITH DIFFERENTIAL/PLATELET
Abs Immature Granulocytes: 0.07 10*3/uL (ref 0.00–0.07)
Basophils Absolute: 0.1 10*3/uL (ref 0.0–0.1)
Basophils Relative: 1 %
Eosinophils Absolute: 0.2 10*3/uL (ref 0.0–0.5)
Eosinophils Relative: 1 %
HCT: 27.1 % — ABNORMAL LOW (ref 39.0–52.0)
Hemoglobin: 9.2 g/dL — ABNORMAL LOW (ref 13.0–17.0)
Immature Granulocytes: 1 %
Lymphocytes Relative: 19 %
Lymphs Abs: 2.4 10*3/uL (ref 0.7–4.0)
MCH: 32.2 pg (ref 26.0–34.0)
MCHC: 33.9 g/dL (ref 30.0–36.0)
MCV: 94.8 fL (ref 80.0–100.0)
Monocytes Absolute: 1.1 10*3/uL — ABNORMAL HIGH (ref 0.1–1.0)
Monocytes Relative: 9 %
Neutro Abs: 8.7 10*3/uL — ABNORMAL HIGH (ref 1.7–7.7)
Neutrophils Relative %: 69 %
Platelets: 459 10*3/uL — ABNORMAL HIGH (ref 150–400)
RBC: 2.86 MIL/uL — ABNORMAL LOW (ref 4.22–5.81)
RDW: 15.8 % — ABNORMAL HIGH (ref 11.5–15.5)
WBC: 12.5 10*3/uL — ABNORMAL HIGH (ref 4.0–10.5)
nRBC: 0 % (ref 0.0–0.2)

## 2021-03-01 LAB — IRON AND TIBC
Iron: 103 ug/dL (ref 45–182)
Saturation Ratios: 69 % — ABNORMAL HIGH (ref 17.9–39.5)
TIBC: 150 ug/dL — ABNORMAL LOW (ref 250–450)
UIBC: 47 ug/dL

## 2021-03-01 LAB — FERRITIN: Ferritin: 1157 ng/mL — ABNORMAL HIGH (ref 24–336)

## 2021-03-01 MED ORDER — POTASSIUM CHLORIDE CRYS ER 20 MEQ PO TBCR: EXTENDED_RELEASE_TABLET | ORAL | 0 refills | Status: DC

## 2021-03-01 NOTE — Telephone Encounter (Signed)
Patient called to advise of low potassium.  Message left with family member to have patient call back for instructions.  Called Dr. Josephine Cables office to notify of results and plan.  Labs sent to office for his review.  Will call patient back if no return call.

## 2021-03-01 NOTE — Progress Notes (Unsigned)
CRITICAL VALUE ALERT Critical value received:  Potassium 2.7. Date of notification:  03/01/21 Time of notification: 09:39 am.  Critical value read back:  Yes.   Nurse who received alert:  B Laiana Fratus RN / Received alert from [facility].  MD notified time and response:  R Pennington PA by secure chat.

## 2021-03-01 NOTE — Progress Notes (Signed)
Labs obtained today show critical potassium 2.7.  Patient has a history of hypokalemia being followed by his PCP.  We will correct potassium as follows: - Prescription sent to pharmacy for potassium 60 MEQ orally x1 day, followed by potassium 20 MEQ daily x7 days. - Patient instructed to call primary care provider for ongoing work-up and management of his hypokalemia.

## 2021-03-01 NOTE — Telephone Encounter (Signed)
Spoke with patient.  Medication instructions given to him and advised to call Dr Josephine Cables office this afternoon for any further instruction.  Advised that script has been sent to the pharmacy.  Verbalized understanding.

## 2021-03-02 ENCOUNTER — Other Ambulatory Visit: Payer: Self-pay

## 2021-03-02 DIAGNOSIS — K7031 Alcoholic cirrhosis of liver with ascites: Secondary | ICD-10-CM

## 2021-03-02 DIAGNOSIS — K7460 Unspecified cirrhosis of liver: Secondary | ICD-10-CM

## 2021-03-02 NOTE — Progress Notes (Signed)
[place] [place], [place] [address]   CLINIC:  Medical Oncology/Hematology  PCP:  Rosita Fire, MD [place] [id number] [phone]   REASON FOR VISIT:  Follow-up for elevated ferritin (H63D heterozygous) and normocytic anemia  PRIOR THERAPY: None  CURRENT THERAPY: Under work-up/observation  INTERVAL HISTORY:  [name] [name] 55 y.o. male returns for routine follow-up of elevated ferritin and normocytic anemia.  He was seen for initial consultation by Dr. Delton Coombes and Tarri Abernethy PA-C on 09/13/2020.  He was scheduled for follow-up visit on 10/15/2020, but did not show up.    He was hospitalized from 02/09/2021 through 02/10/2021 due to electrolyte imbalances, AKI, and acute on chronic anemia.  Patient continues to complain of mild fatigue, with energy about 75%, which is his baseline.  He has a history of left lower extremity DVT which was managed by vascular specialists (Dr. Donzetta Matters), who had previously recommended indefinite anticoagulation.  However, due to anemia during most recent hospital stay, Eliquis was discontinued, and Dr. Donzetta Matters recommended that the patient take daily low-dose aspirin instead. He reports that for the last 2 weeks he has noticed increased bilateral lower extremity edema, left greater than right.  Edema is not relieved with leg elevation.  He reports that this occurred before he stopped taking Eliquis and switch to aspirin. No shortness of breath, dyspnea on exertion, chest pain, cough, hemoptysis, or palpitations. He denies any signs or symptoms of bleeding; no tarry black stool or bright red blood per rectum, no hematemesis, hemoptysis, hematuria, hematochezia, or melena.  Regarding his elevated iron, he eats red meat about once per week, but he does cook in a cast iron skillet.  He does not take any oral iron supplementation.  He has not consumed any alcohol since February 2022.  He has 75% energy  and 50% appetite. He endorses that he is maintaining a stable weight.   REVIEW OF SYSTEMS:  Review of Systems  Constitutional:  Positive for fatigue. Negative for appetite change, chills, diaphoresis, fever and unexpected weight change.  HENT:   Negative for lump/mass and nosebleeds.   Eyes:  Negative for eye problems.  Respiratory:  Negative for cough, hemoptysis and shortness of breath.   Cardiovascular:  Positive for leg swelling. Negative for chest pain and palpitations.  Gastrointestinal:  Negative for abdominal pain, blood in stool, constipation, diarrhea, nausea and vomiting.  Genitourinary:  Negative for hematuria.   Skin: Negative.   Neurological:  Negative for dizziness, headaches and light-headedness.  Hematological:  Does not bruise/bleed easily.     PAST MEDICAL/SURGICAL HISTORY:  Past Medical History:  Diagnosis Date   Asthma    Dyspnea    High cholesterol    History of kidney stones    Hypertension    Kidney stones    Past Surgical History:  Procedure Laterality Date   APPENDECTOMY     BIOPSY  07/22/2020   Procedure: BIOPSY;  Surgeon: Daneil Dolin, MD;  [facility];  Service: Endoscopy;;   COLONOSCOPY WITH PROPOFOL N/A 04/18/2017   non-bleeding internal hemorrhoids, two 4-6 mm polyps in descending colon and cecum, pancolonic diverticulosis, single cecal AVM. Tubular adenomas, surveillance in 2023.    ESOPHAGOGASTRODUODENOSCOPY (EGD) WITH PROPOFOL N/A 07/22/2020   normal esophagus, small hiatal hernia, abnormal gastric mucosa, abnormal appearing ampula and periampullary mucosa. Mild chronic gastritis.   FRACTURE SURGERY     left arm   LOWER EXTREMITY VENOGRAPHY N/A 08/10/2020   Procedure: LOWER  EXTREMITY VENOGRAPHY;  Surgeon: Waynetta Sandy, MD;  Location: [facility];  Service: Cardiovascular;  Laterality: N/A;   PERIPHERAL VASCULAR BALLOON ANGIOPLASTY Left 08/10/2020   Procedure: PERIPHERAL VASCULAR BALLOON ANGIOPLASTY;  Surgeon:  Waynetta Sandy, MD;  Location: [facility];  Service: Cardiovascular;  Laterality: Left;  lower extremity venous   PERIPHERAL VASCULAR THROMBECTOMY N/A 08/10/2020   Procedure: PERIPHERAL VASCULAR THROMBECTOMY;  Surgeon: Waynetta Sandy, MD;  Location: [facility];  Service: Cardiovascular;  Laterality: N/A;   POLYPECTOMY  04/18/2017   Procedure: POLYPECTOMY;  Surgeon: Daneil Dolin, MD;  [facility];  Service: Endoscopy;;     SOCIAL HISTORY:  Social History   Socioeconomic History   Marital status: Single    Spouse name: Not on file   Number of children: Not on file   Years of education: Not on file   Highest education level: Not on file  Occupational History   Not on file  Tobacco Use   Smoking status: Every Day    Packs/day: 0.50    Years: 33.00    Pack years: 16.50    Types: Cigarettes   Smokeless tobacco: Never  Vaping Use   Vaping Use: Never used  Substance and Sexual Activity   Alcohol use: No    Comment: history of ETOH use 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 39s, no ETOH for about 4 weeks as of 07/07/20   Drug use: No   Sexual activity: Yes    Birth control/protection: None  Other Topics Concern   Not on file  Social History Narrative   Not on file   Social Determinants of Health   Financial Resource Strain: High Risk   Difficulty of Paying Living Expenses: Very hard  Food Insecurity: No Food Insecurity   Worried About Charity fundraiser in the Last Year: Never true   Ran Out of Food in the Last Year: Never true  Transportation Needs: No Transportation Needs   Lack of Transportation (Medical): No   Lack of Transportation (Non-Medical): No  Physical Activity: Sufficiently Active   Days of Exercise per Week: 7 days   Minutes of Exercise per Session: 30 min  Stress: No Stress Concern Present   Feeling of Stress : Only a little  Social Connections: Moderately Isolated   Frequency of Communication with Friends and  Family: More than three times a week   Frequency of Social Gatherings with Friends and Family: Once a week   Attends Religious Services: More than 4 times per year   Active Member of [employer] or Organizations: No   Attends Music therapist: Never   Marital Status: Never married  Human resources officer Violence: Not At Risk   Fear of Current or Ex-Partner: No   Emotionally Abused: No   Physically Abused: No   Sexually Abused: No    FAMILY HISTORY:  Family History  Problem Relation Age of Onset   Cancer Father        throat   Diabetes Sister    Colon cancer Neg Hx     CURRENT MEDICATIONS:  Outpatient Encounter Medications as of 03/03/2021  Medication Sig   acetaminophen (TYLENOL) 500 MG tablet Take 1,000 mg by mouth every 6 (six) hours as needed.   albuterol (PROVENTIL HFA) 108 (90 Base) MCG/ACT inhaler INHALE 2 PUFFS BY MOUTH EVERY 6 HOURS AS NEEDED FOR COUGHING, WHEEZING, OR SHORTNESS OF BREATH   amlodipine-atorvastatin (CADUET) 10-10 MG tablet Take 1 tablet  by mouth daily.   furosemide (LASIX) 40 MG tablet Take 40 mg by mouth.   mometasone-formoterol (DULERA) 200-5 MCG/ACT AERO Inhale 2 puffs into the lungs 2 (two) times daily.   nicotine (NICODERM CQ - DOSED IN MG/24 HOURS) 14 mg/24hr patch Place 1 patch (14 mg total) onto the skin daily.   pantoprazole (PROTONIX) 40 MG tablet Take 1 tablet (40 mg total) by mouth daily.   potassium chloride SA (KLOR-CON) 20 MEQ tablet Take 3 tablets (60 mEq total) by mouth daily for 1 day, THEN 1 tablet (20 mEq total) daily for 7 days.   spironolactone (ALDACTONE) 50 MG tablet Take 50 mg by mouth daily.   No facility-administered encounter medications on file as of 03/03/2021.    ALLERGIES:  No Known Allergies   PHYSICAL EXAM:  ECOG PERFORMANCE STATUS: 1 - Symptomatic but completely ambulatory  There were no vitals filed for this visit. There were no vitals filed for this visit. Physical Exam Constitutional:      Appearance:  Normal appearance.  HENT:     Head: Normocephalic and atraumatic.     Mouth/Throat:     Mouth: Mucous membranes are moist.  Eyes:     Extraocular Movements: Extraocular movements intact.     Pupils: Pupils are equal, round, and reactive to light.  Cardiovascular:     Rate and Rhythm: Normal rate and regular rhythm.     Pulses: Normal pulses.     Heart sounds: Normal heart sounds.  Pulmonary:     Effort: Pulmonary effort is normal.     Breath sounds: Normal breath sounds.  Abdominal:     General: Bowel sounds are normal. There is distension.     Palpations: Abdomen is soft.     Tenderness: There is no abdominal tenderness.  Musculoskeletal:        General: No swelling.     Right lower leg: Edema (1+) present.     Left lower leg: Edema (2+) present.  Lymphadenopathy:     Cervical: No cervical adenopathy.  Skin:    General: Skin is warm and dry.  Neurological:     General: No focal deficit present.     Mental Status: He is alert and oriented to person, place, and time.  Psychiatric:        Mood and Affect: Mood normal.        Behavior: Behavior normal.     LABORATORY DATA:  I have reviewed the labs as listed.  CBC    Component Value Date/Time   WBC 12.5 (H) 03/01/2021 0822   RBC 2.86 (L) 03/01/2021 0822   HGB 9.2 (L) 03/01/2021 0822   HCT 27.1 (L) 03/01/2021 0822   PLT 459 (H) 03/01/2021 0822   MCV 94.8 03/01/2021 0822   MCH 32.2 03/01/2021 0822   MCHC 33.9 03/01/2021 0822   RDW 15.8 (H) 03/01/2021 0822   LYMPHSABS 2.4 03/01/2021 0822   MONOABS 1.1 (H) 03/01/2021 0822   EOSABS 0.2 03/01/2021 0822   BASOSABS 0.1 03/01/2021 0822   CMP Latest Ref Rng & Units 03/01/2021 02/25/2021 02/10/2021  Glucose 70 - 99 mg/dL 97 79 135(H)  BUN 6 - 20 mg/dL 11 9 21(H)  Creatinine 0.61 - 1.24 mg/dL 1.29(H) 1.11 1.23  Sodium 135 - 145 mmol/L 140 141 133(L)  Potassium 3.5 - 5.1 mmol/L 2.7(LL) 3.2(L) 3.7  Chloride 98 - 111 mmol/L 111 107 102  CO2 22 - 32 mmol/L 23 26 27   Calcium  8.9 - 10.3 mg/dL 7.4(L)  7.8(L) 7.3(L)  Total Protein 6.5 - 8.1 g/dL 6.7 6.4 -  Total Bilirubin 0.3 - 1.2 mg/dL 0.7 0.7 -  Alkaline Phos 38 - 126 U/L 94 - -  AST 15 - 41 U/L 22 23 -  ALT 0 - 44 U/L 11 13 -    DIAGNOSTIC IMAGING:  I have independently reviewed the relevant imaging and discussed with the patient.  ASSESSMENT & PLAN: 1.  Elevated ferritin with H63D - Previously diagnosed with alcoholic liver cirrhosis, and was found to have elevated ferritin 1,254 on 07/20/2020 (normal ferritin noted in 2018). 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   - Most recent iron panel (03/01/2021): Elevated ferritin 1157, iron saturation 69%, and low TIBC 450 - Elevated ferritin most likely due to liver disease rather than H63D heterozygosity - PLAN: Elevated ferritin likely due to liver disease more so than H63D heterozygosity. - Patient counseled to continue to abstain from alcohol and to avoid excessive dietary iron. - Patient instructed to STOP cooking in cast iron skillets - We will repeat ferritin and iron panel in 2 months.  2. Left lower extremity DVT - Patient being followed by vascular team for extensive left lower extremity DVT diagnosed 08/04/2020 - beginning at the level of the left external iliac vein origin and extending into the common femoral, superficial femoral, and deep femoral veins to the level of the popliteal vein - Ultrasound-guided LLE thrombectomy performed by Dr. Servando Snare of vascular surgery - Has been on Eliquis since 08/04/2020 through 02/10/2021. - Eliquis discontinued on 02/10/2021 due to anemia with concern for possible GI bleed, was placed on aspirin instead by the recommendation of Dr. Donzetta Matters - Reports bilateral lower extremity edema, left greater than right x2 weeks, not relieved  by elevation - PLAN: We will check venous duplex ultrasound of bilateral legs, and will call patient with results. - If any recurrent DVT, patient will likely need to restart Eliquis.  We would send him back to the vascular team for this, since he has been following with them for management of his DVT. - For the time being, continue aspirin and follow-up as directed by vascular team.  3. Normocytic anemia - CBC (08/11/2020) showed normocytic anemia with Hgb 10.1 and MCV 89.7. - Initial work-up for anemia was unremarkable and hemoglobin had self-corrected to 13.3 (09/13/2020). - SPEP and IFE showed polyclonal increase in immunoglobulins, and increased kappa/lambda light chains with normal ratio.  No M spike or monoclonal protein apparent. - Reticulocytes and erythropoietin normal.  LDH mildly elevated at 211.  Bilirubin normal. - Mild folate deficiency and vitamin D deficiency on labs from (09/13/2020).  Normal B12, methylmalonic acid, copper. - EGD (07/22/2020) with normal esophagus, small hiatal hernia, abnormal gastric mucosa, abnormal appearing ampula and periampullary mucosa. Mild chronic gastritis.  - CMP shows CKD stage II/IIIa, with most recent creatinine 1.29 (03/01/2021), but renal function may actually be slightly worse than this and skewed by underlying liver disease - Recurrent anemia noted during hospitalization with Hgb 7.5 (02/10/2021) - Most recent labs (03/01/2021): Hgb 9.2/MCV 94.8, ferritin 1157, iron saturation 69% - He denies any overt signs of bleeding - He is symptomatic only with mild chronic fatigue - PLAN: Hemoccult stool cards were ordered at initial consultation, but not yet completed.  Stool cards x3 have been reordered and sent home with  patient. - Unclear etiology of anemia at this time, suspect possible occult GI bleed versus anemia of CKD/chronic disease. - We will replete folate as below and recheck labs with RTC in 2 months.  4.  Folate deficiency - Folate low at  4.9 (09/13/2020) - PLAN: We will start patient on daily folic acid supplementation.  5.  Vitamin D deficiency - Vitamin D low at 20.88 (09/13/2020) - PLAN: We will start patient on vitamin D 2000 units daily.  6.  Hypokalemia - Patient has chronic issues with hypokalemia being managed by his primary care provider - He was hospitalized for hypokalemia from 02/09/2021 through 02/10/2021 - CMP on 03/01/2021 showed critical hypokalemia with potassium 2.7 - Prescription was sent to pharmacy for 60 mEq potassium x1 day followed by 20 mEq potassium daily x7 days - PLAN: Patient instructed to continue follow-up with primary care provider for ongoing management of hypokalemia.  7.  Other history - His PMH is also notable for COPD in addition to alcoholic liver cirrhosis and LLE DVT noted above. -The patient is a recovering alcoholic - previously drank 12 beers per day for 20 + years, quit in February 202.   - He is an everyday tobacco user, smoking 0.5 PPD for the past 30 years.   - He denies illicit drug use.   - He is on disability and lives at home with his fianc. - No family history of liver disease or blood clots.  Father had throat cancer secondary to smoking.  No family hemochromatosis history.      PLAN SUMMARY & DISPOSITION: -Stool cards x3 - Venous duplex of bilateral legs - Start taking folate and vitamin D - Labs and RTC in 2 months  All questions were answered. The patient knows to call the clinic with any problems, questions or concerns.  Medical decision making: Moderate  Time spent on visit: I spent 25 minutes counseling the patient face to face. The total time spent in the appointment was 40 minutes and more than 50% was on counseling.   [name], PA-C  03/03/2021 9:56 AM

## 2021-03-03 ENCOUNTER — Inpatient Hospital Stay (HOSPITAL_BASED_OUTPATIENT_CLINIC_OR_DEPARTMENT_OTHER): Payer: Medicaid Other | Admitting: Physician Assistant

## 2021-03-03 ENCOUNTER — Other Ambulatory Visit: Payer: Self-pay

## 2021-03-03 ENCOUNTER — Other Ambulatory Visit (HOSPITAL_COMMUNITY): Payer: Self-pay | Admitting: *Deleted

## 2021-03-03 VITALS — BP 115/76 | HR 89 | Temp 97.4°F | Resp 19 | Wt 167.1 lb

## 2021-03-03 DIAGNOSIS — D649 Anemia, unspecified: Secondary | ICD-10-CM

## 2021-03-03 DIAGNOSIS — I82492 Acute embolism and thrombosis of other specified deep vein of left lower extremity: Secondary | ICD-10-CM

## 2021-03-03 DIAGNOSIS — E559 Vitamin D deficiency, unspecified: Secondary | ICD-10-CM

## 2021-03-03 DIAGNOSIS — R7989 Other specified abnormal findings of blood chemistry: Secondary | ICD-10-CM | POA: Diagnosis not present

## 2021-03-03 DIAGNOSIS — E538 Deficiency of other specified B group vitamins: Secondary | ICD-10-CM

## 2021-03-03 MED ORDER — FOLIC ACID 1 MG PO TABS: 1.0000 mg | ORAL_TABLET | Freq: Every day | ORAL | 6 refills | Status: DC

## 2021-03-03 MED ORDER — CHOLECALCIFEROL 50 MCG (2000 UT) PO CAPS: 1.0000 | ORAL_CAPSULE | Freq: Every day | ORAL | 6 refills | Status: DC

## 2021-03-03 NOTE — Patient Instructions (Signed)
[place] at [facility] Discharge Instructions  You were seen today by Tarri Abernethy PA-C for your iron overload and your anemia.  IRON OVERLOAD: This is related to your underlying liver cirrhosis.  There is no need for treatment at this time, but it is important that you STOP using cast iron skillets to cook in.  Avoid excessive red meat consumption and do not take any iron supplement.  ANEMIA: We do not yet know why your blood counts are low.  It may be that you are losing some blood from your stomach or intestines.  We will check STOOL CARDS x3 to see if you are losing blood.  It is also possible that you have some vitamin and mineral deficiencies causing your low blood.  FOLATE DEFICIENCY: Your folic acid is low, which may be causing some of your anemia.  You will need to start taking folic acid supplement once daily.  VITAMIN D DEFICIENCY: Your vitamin D is low.  You will need to start taking vitamin D once daily.  HISTORY OF BLOOD CLOTS: We will check an ultrasound of your legs to make sure you do not have any blood clots that have come back.  The swelling may be due to your cirrhosis as well.  For the time being, continue to take aspirin.  If we notice any new blood clots, we will have you follow-up with your vascular doctor again.  LABS: Return in 2 months for repeat labs  OTHER TESTS: Ultrasound of legs  MEDICATIONS: - Vitamin D (cholecalciferol) 2000 units daily - Folic acid 1 mg daily  FOLLOW-UP APPOINTMENT: Office visit in 2 months, after labs   Thank you for choosing [place] at [facility] to provide your oncology and hematology care.  To afford each patient quality time with our provider, please arrive at least 15 minutes before your scheduled appointment time.   If you have a lab appointment with the [facility] please come in thru the Main Entrance and check in at the main information desk.  You need to  re-schedule your appointment should you arrive 10 or more minutes late.  We strive to give you quality time with our providers, and arriving late affects you and other patients whose appointments are after yours.  Also, if you no show three or more times for appointments you may be dismissed from the clinic at the providers discretion.     Again, thank you for choosing [facility].  Our hope is that these requests will decrease the amount of time that you wait before being seen by our physicians.       _____________________________________________________________  Should you have questions after your visit to [facility], please contact our office at [phone] and follow the prompts.  Our office hours are 8:00 a.m. and 4:30 p.m. Monday - Friday.  Please note that voicemails left after 4:00 p.m. may not be returned until the following business day.  We are closed weekends and major holidays.  You do have access to a nurse 24-7, just call the main number to the clinic [phone] and do not press any options, hold on the line and a nurse will answer the phone.    For prescription refill requests, have your pharmacy contact our office and allow 72 hours.    Due to Covid, you will need to wear a mask upon entering the hospital. If you do not have a mask, a  mask will be given to you at the Main Entrance upon arrival. For doctor visits, patients may have 1 support person age 25 or older with them. For treatment visits, patients can not have anyone with them due to social distancing guidelines and our immunocompromised population.

## 2021-03-09 ENCOUNTER — Other Ambulatory Visit (HOSPITAL_COMMUNITY): Payer: Self-pay | Admitting: Physician Assistant

## 2021-03-09 DIAGNOSIS — E876 Hypokalemia: Secondary | ICD-10-CM

## 2021-03-11 ENCOUNTER — Ambulatory Visit (HOSPITAL_COMMUNITY)
Admission: RE | Admit: 2021-03-11 | Discharge: 2021-03-11 | Disposition: A | Discharge status: Home / Self Care | Payer: Medicaid Other | Source: Ambulatory Visit | Attending: Physician Assistant | Admitting: Physician Assistant

## 2021-03-11 ENCOUNTER — Other Ambulatory Visit: Payer: Self-pay

## 2021-03-11 DIAGNOSIS — I82492 Acute embolism and thrombosis of other specified deep vein of left lower extremity: Secondary | ICD-10-CM | POA: Insufficient documentation

## 2021-03-11 IMAGING — US US EXTREM LOW VENOUS
1 series · 12 of 24 positions shown · non-contrast
Comparison: Prior duplex venous evaluation [DATE]

CLINICAL DATA: Bilateral lower extremity edema for the past 2
weeks. Prior history of left lower extremity DVT



[Series 1: us venous img lower bilat (dvt) · portal-venous · 12 of 95 slices shown]
[im 5/95]
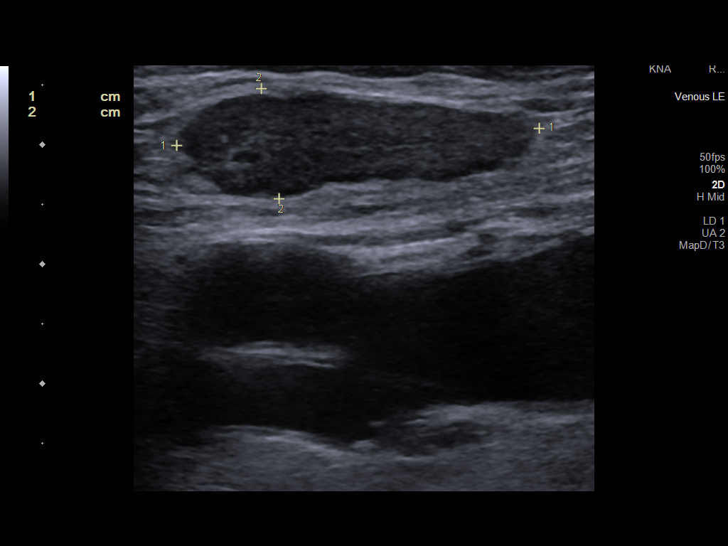
[im 13/95]
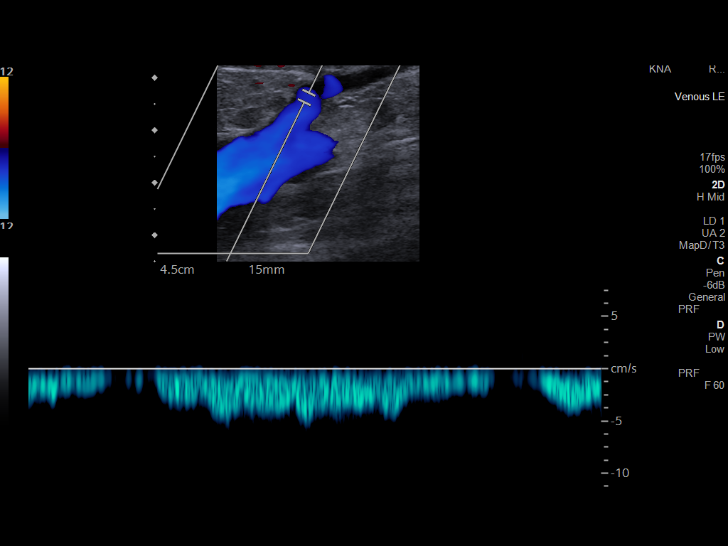
[im 21/95]
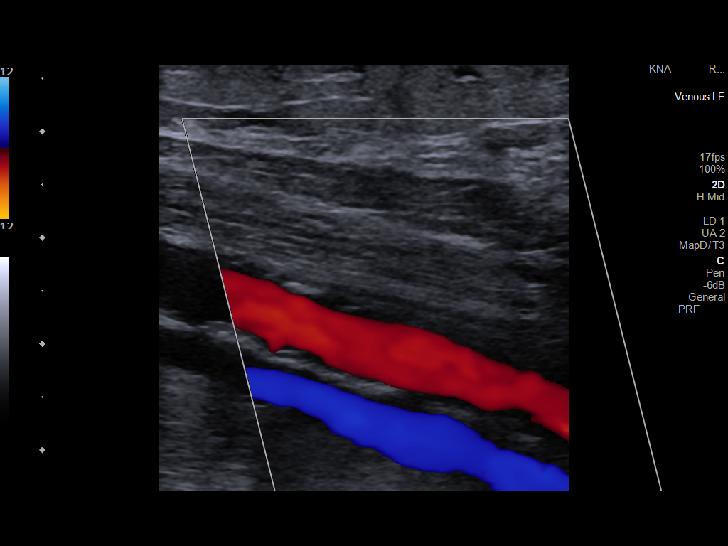
[im 29/95]
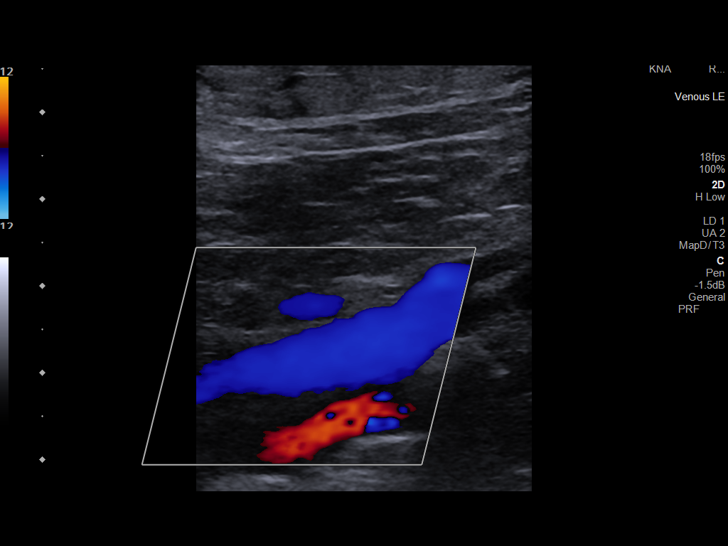
[im 37/95]
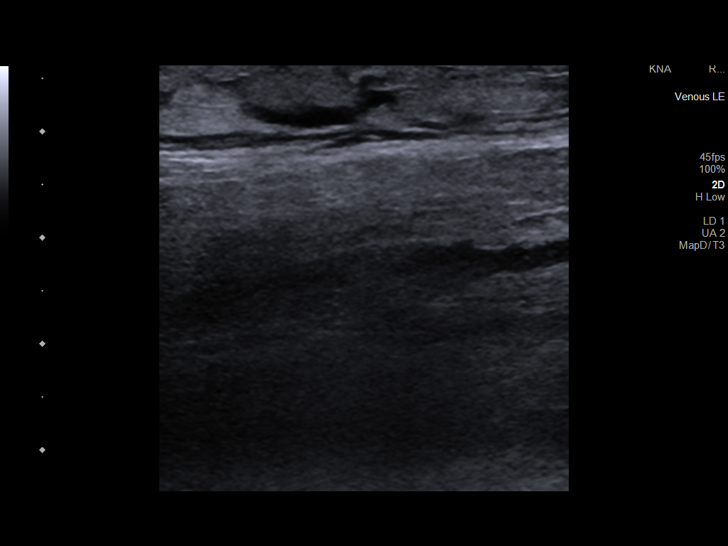
[im 45/95]
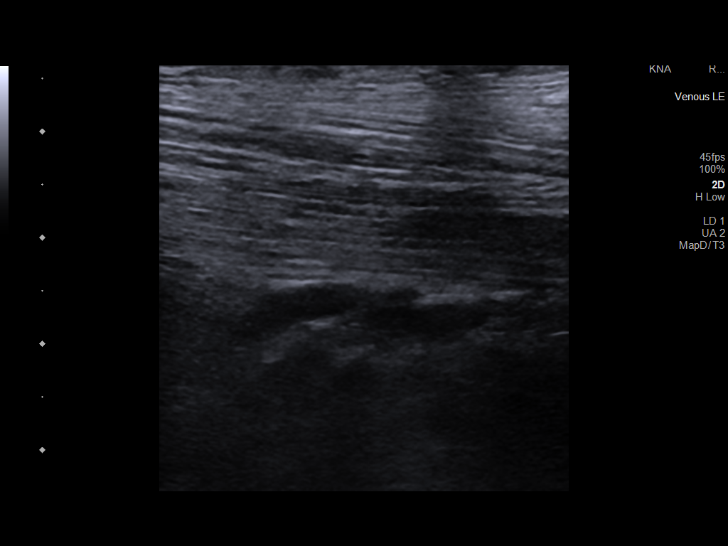
[im 54/95]
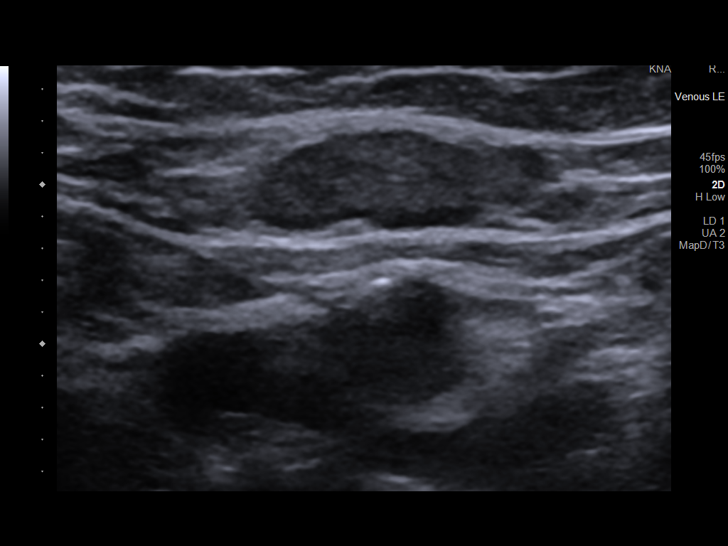
[im 62/95]
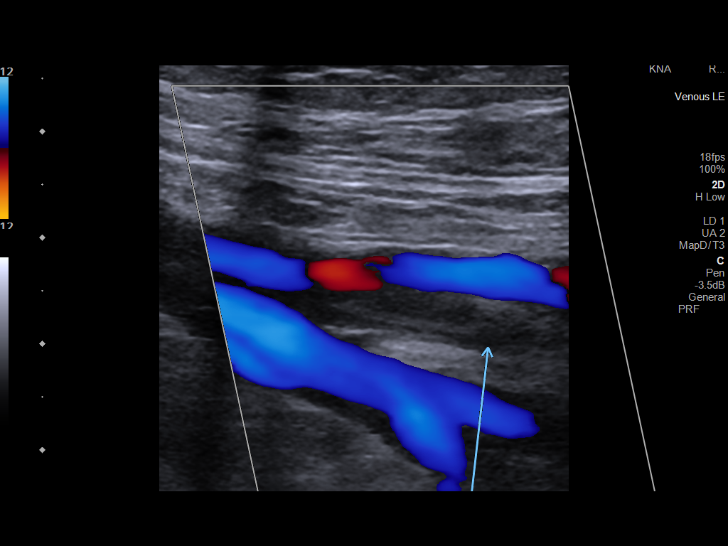
[im 70/95]
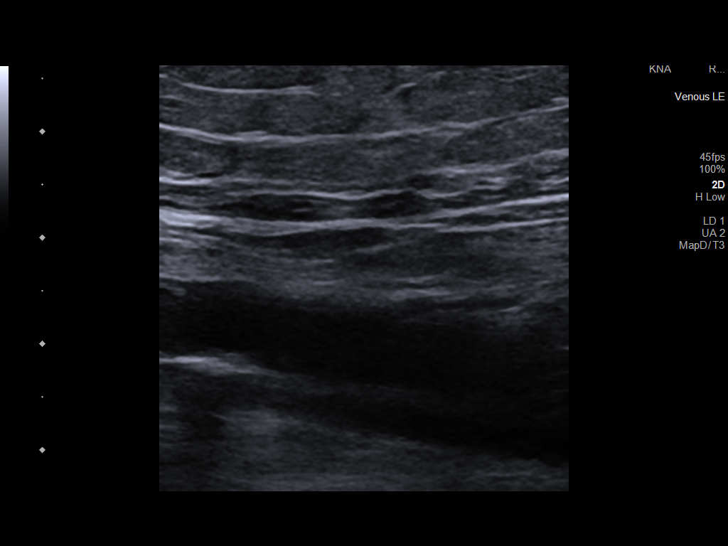
[im 78/95]
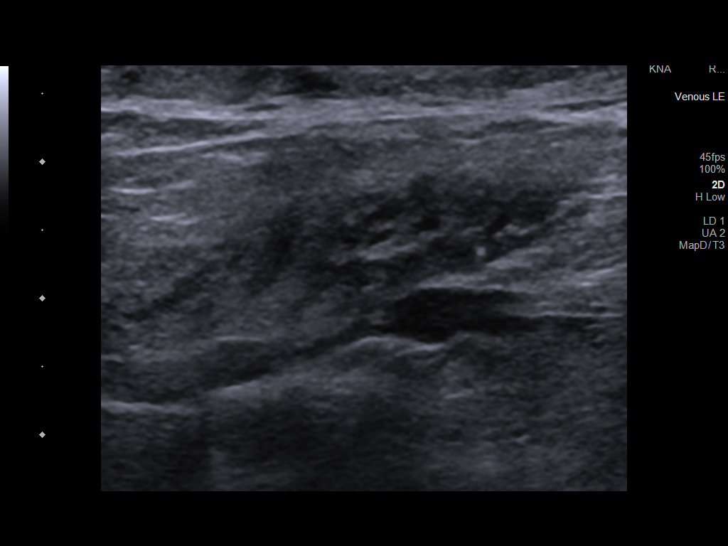
[im 86/95]
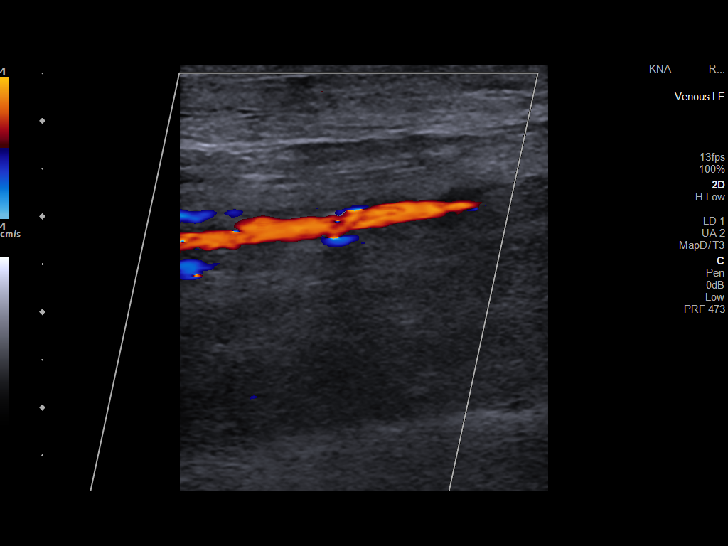
[im 95/95]
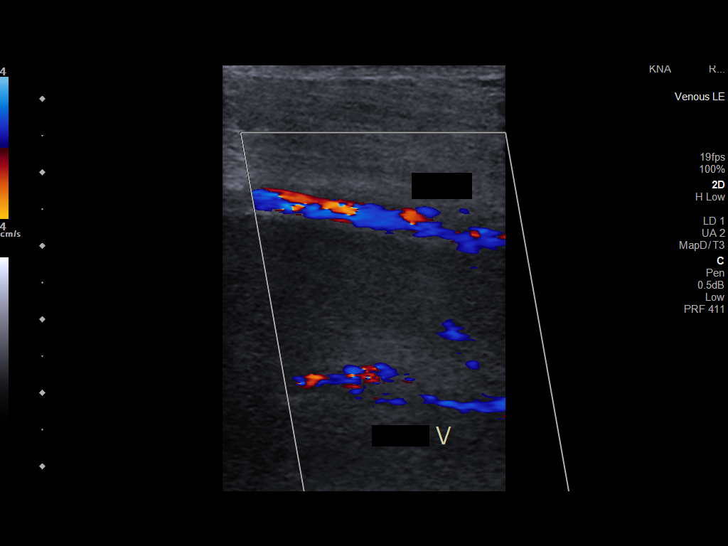

[12 of 24 positions shown; findings below may reference images not displayed]

FINDINGS: RIGHT LOWER EXTREMITY

Common Femoral Vein: No evidence of thrombus. Normal
compressibility, respiratory phasicity and response to augmentation.

Saphenofemoral Junction: No evidence of thrombus. Normal
compressibility and flow on color Doppler imaging.

Profunda Femoral Vein: No evidence of thrombus. Normal
compressibility and flow on color Doppler imaging.

Femoral Vein: No evidence of thrombus. Normal compressibility,
respiratory phasicity and response to augmentation.

Popliteal Vein: No evidence of thrombus. Normal compressibility,
respiratory phasicity and response to augmentation.

Calf Veins: No evidence of thrombus. Normal compressibility and flow
on color Doppler imaging.

Superficial Great Saphenous Vein: No evidence of thrombus. Normal
compressibility.

Venous Reflux:  None.

Other Findings:  None.

LEFT LOWER EXTREMITY

Common Femoral Vein: No evidence of thrombus. Normal
compressibility, respiratory phasicity and response to augmentation.

Saphenofemoral Junction: No evidence of thrombus. Normal
compressibility and flow on color Doppler imaging.

Profunda Femoral Vein: No evidence of thrombus. Normal
compressibility and flow on color Doppler imaging.

Femoral Vein: Eccentric wall thickening in the femoral vein in the
proximal and mid thigh. Positive color flow on color Doppler
imaging.

Popliteal Vein: Poor compressibility of the popliteal vein with
patchy areas of color flow on color Doppler imaging.

Calf Veins: No evidence of thrombus. Normal compressibility and flow
on color Doppler imaging.

Superficial Great Saphenous Vein: No evidence of thrombus. Normal
compressibility.

Venous Reflux:  None.

Other Findings:  None.
IMPRESSION: 1. Significant interval improvement in the degree of DVT in the left
lower extremity compared to [DATE]. There is some persistent and
now likely chronic nonocclusive DVT within the popliteal vein and
femoral vein.
2. No evidence of acute DVT in the right lower extremity.
3. Prominent superficial inguinal lymph nodes bilaterally are
favored to be reactive in nature.
4. Bilateral superficial subcutaneous edema.

## 2021-03-11 NOTE — Progress Notes (Signed)
RN POOL:  Please call [name] [name] to let him know that his ultrasound did not show any new DVT, and showed decreased size of his prior chronic DVT.  You can tell him that we discussed with Dr. Donzetta Matters (his vascular surgeon), who stated that he can remain on aspirin for the time being.  Patient should follow up with Dr. Donzetta Matters as previously scheduled.  If he has any new or worsening symptoms, he should reach out to either our office or his vascular team.

## 2021-03-11 NOTE — Progress Notes (Signed)
Dr. Donzetta Matters,  I believe that you are following Mr. Vinje for his history of extensive LLE DVT.  He has previously been on Eliquis, but I understand that after hospitalization in October, his Eliquis was switched to aspirin due to anemia with concern for possible occult bleeding.  I am seeing him at the hematology clinic for elevated ferritin, and at the time of his most recent visit he was complaining of some increased bilateral lower extremity edema, left greater than right.  Ultrasound results are attached.  Just wanted to run them past you to see if you are okay with him continuing on aspirin alone for the time being.  Please let me know if you have any further recommendations, or if you would like [place] to have him see you sooner rather than later.  Thanks much for collaborating in the care of this patient!  - Abdou Stocks  Eugene Garnet Leron Croak, PA-C at [facility])

## 2021-03-14 NOTE — Progress Notes (Signed)
Patient called.  Unable to reach patient or his aunt.  Will continue to reach out.

## 2021-03-15 NOTE — Progress Notes (Signed)
Patient called.  Patient aware and verbalized understanding.

## 2021-04-04 ENCOUNTER — Other Ambulatory Visit (HOSPITAL_COMMUNITY): Payer: Self-pay | Admitting: Physician Assistant

## 2021-04-04 DIAGNOSIS — R768 Other specified abnormal immunological findings in serum: Secondary | ICD-10-CM

## 2021-04-28 ENCOUNTER — Ambulatory Visit: Payer: Medicaid Other | Admitting: Podiatry

## 2021-05-05 ENCOUNTER — Ambulatory Visit: Payer: Medicaid Other | Admitting: Podiatry

## 2021-05-06 ENCOUNTER — Inpatient Hospital Stay (HOSPITAL_COMMUNITY): Payer: Medicaid Other | Attending: Hematology

## 2021-05-13 ENCOUNTER — Ambulatory Visit (HOSPITAL_COMMUNITY): Payer: Medicaid Other | Admitting: Physician Assistant

## 2021-05-24 ENCOUNTER — Other Ambulatory Visit (HOSPITAL_COMMUNITY): Payer: Self-pay | Admitting: Gerontology

## 2021-05-24 DIAGNOSIS — R188 Other ascites: Secondary | ICD-10-CM

## 2021-05-27 ENCOUNTER — Other Ambulatory Visit: Payer: Self-pay

## 2021-05-27 ENCOUNTER — Emergency Department (HOSPITAL_COMMUNITY)
Admission: EM | Admit: 2021-05-27 | Discharge: 2021-05-28 | Disposition: A | Discharge status: Home / Self Care | Payer: Medicaid Other | Attending: Student | Admitting: Student

## 2021-05-27 ENCOUNTER — Emergency Department (HOSPITAL_COMMUNITY): Payer: Medicaid Other

## 2021-05-27 ENCOUNTER — Encounter (HOSPITAL_COMMUNITY): Payer: Self-pay

## 2021-05-27 DIAGNOSIS — I10 Essential (primary) hypertension: Secondary | ICD-10-CM | POA: Insufficient documentation

## 2021-05-27 DIAGNOSIS — Z7901 Long term (current) use of anticoagulants: Secondary | ICD-10-CM | POA: Diagnosis not present

## 2021-05-27 DIAGNOSIS — E876 Hypokalemia: Secondary | ICD-10-CM | POA: Diagnosis not present

## 2021-05-27 DIAGNOSIS — Z79899 Other long term (current) drug therapy: Secondary | ICD-10-CM | POA: Diagnosis not present

## 2021-05-27 DIAGNOSIS — Z7951 Long term (current) use of inhaled steroids: Secondary | ICD-10-CM | POA: Diagnosis not present

## 2021-05-27 DIAGNOSIS — R1084 Generalized abdominal pain: Secondary | ICD-10-CM | POA: Diagnosis not present

## 2021-05-27 DIAGNOSIS — J449 Chronic obstructive pulmonary disease, unspecified: Secondary | ICD-10-CM | POA: Diagnosis not present

## 2021-05-27 DIAGNOSIS — K7031 Alcoholic cirrhosis of liver with ascites: Secondary | ICD-10-CM

## 2021-05-27 DIAGNOSIS — R109 Unspecified abdominal pain: Secondary | ICD-10-CM | POA: Diagnosis present

## 2021-05-27 DIAGNOSIS — J45909 Unspecified asthma, uncomplicated: Secondary | ICD-10-CM | POA: Diagnosis not present

## 2021-05-27 DIAGNOSIS — D72829 Elevated white blood cell count, unspecified: Secondary | ICD-10-CM | POA: Diagnosis not present

## 2021-05-27 DIAGNOSIS — F17210 Nicotine dependence, cigarettes, uncomplicated: Secondary | ICD-10-CM | POA: Diagnosis not present

## 2021-05-27 LAB — COMPREHENSIVE METABOLIC PANEL
ALT: 14 U/L (ref 0–44)
AST: 30 U/L (ref 15–41)
Albumin: 1.7 g/dL — ABNORMAL LOW (ref 3.5–5.0)
Alkaline Phosphatase: 104 U/L (ref 38–126)
Anion gap: 9 (ref 5–15)
BUN: 30 mg/dL — ABNORMAL HIGH (ref 6–20)
CO2: 25 mmol/L (ref 22–32)
Calcium: 7.8 mg/dL — ABNORMAL LOW (ref 8.9–10.3)
Chloride: 101 mmol/L (ref 98–111)
Creatinine, Ser: 1.32 mg/dL — ABNORMAL HIGH (ref 0.61–1.24)
GFR, Estimated: >60 mL/min (ref >60)
Glucose, Bld: 117 mg/dL — ABNORMAL HIGH (ref 70–99)
Potassium: 2.8 mmol/L — ABNORMAL LOW (ref 3.5–5.1)
Sodium: 135 mmol/L (ref 135–145)
Total Bilirubin: 0.2 mg/dL — ABNORMAL LOW (ref 0.3–1.2)
Total Protein: 6.8 g/dL (ref 6.5–8.1)

## 2021-05-27 LAB — CBC
HCT: 23.4 % — ABNORMAL LOW (ref 39.0–52.0)
Hemoglobin: 7.8 g/dL — ABNORMAL LOW (ref 13.0–17.0)
MCH: 31 pg (ref 26.0–34.0)
MCHC: 33.3 g/dL (ref 30.0–36.0)
MCV: 92.9 fL (ref 80.0–100.0)
Platelets: 398 10*3/uL (ref 150–400)
RBC: 2.52 MIL/uL — ABNORMAL LOW (ref 4.22–5.81)
RDW: 21.2 % — ABNORMAL HIGH (ref 11.5–15.5)
WBC: 12 10*3/uL — ABNORMAL HIGH (ref 4.0–10.5)
nRBC: 0 % (ref 0.0–0.2)

## 2021-05-27 LAB — GLUCOSE, PLEURAL OR PERITONEAL FLUID: Glucose, Fluid: 117 mg/dL

## 2021-05-27 LAB — GRAM STAIN

## 2021-05-27 LAB — BODY FLUID CELL COUNT WITH DIFFERENTIAL
Eos, Fluid: 0 %
Lymphs, Fluid: 66 %
Monocyte-Macrophage-Serous Fluid: 29 % — ABNORMAL LOW (ref 50–90)
Neutrophil Count, Fluid: 5 % (ref 0–25)
Total Nucleated Cell Count, Fluid: 229 cu mm (ref 0–1000)

## 2021-05-27 LAB — LIPASE, BLOOD: Lipase: 45 U/L (ref 11–51)

## 2021-05-27 LAB — CBG MONITORING, ED: Glucose-Capillary: 90 mg/dL (ref 70–99)

## 2021-05-27 IMAGING — CT CT ABD-PELV W/ CM
2 of 5 series · 16 of 46 positions shown, 18 images · IV contrast (Omnipaque or Isovue)
Comparison: [DATE]

CLINICAL DATA: Acute abdominal pain

EXAM:
CT ABDOMEN AND PELVIS WITH CONTRAST
TECHNIQUE: Multidetector CT imaging of the abdomen and pelvis was performed
using the standard protocol following bolus administration of
intravenous contrast.

[Series 2: axial st · axial · 0.82mm/px · z∈[-189,+276]mm · 13 of 105 slices shown, 15 images]
[im 6/105  soft-tissue]
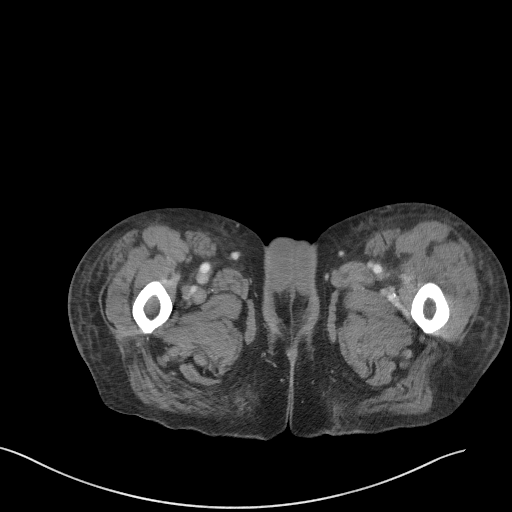
[im 6/105  bone]
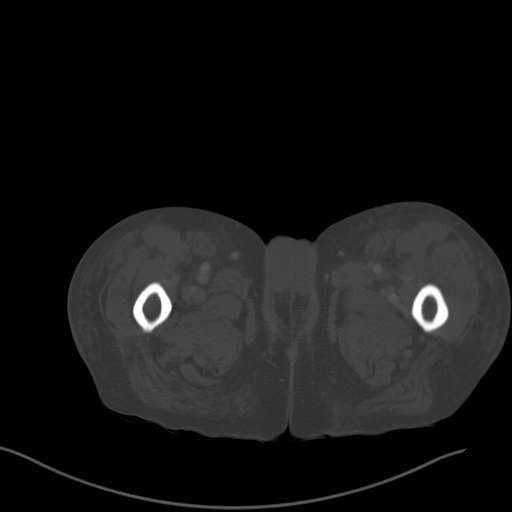
[im 17/105  soft-tissue]
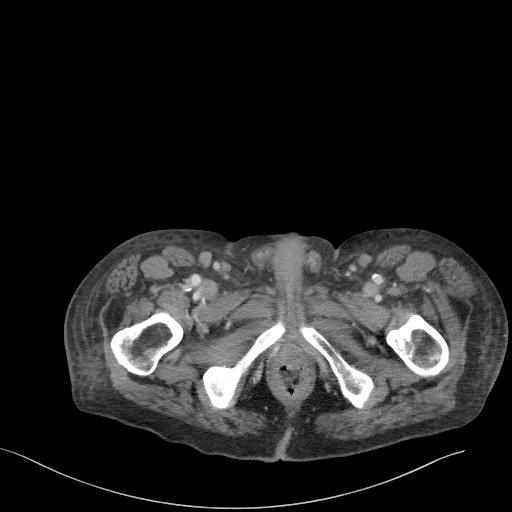
[im 22/105  soft-tissue]
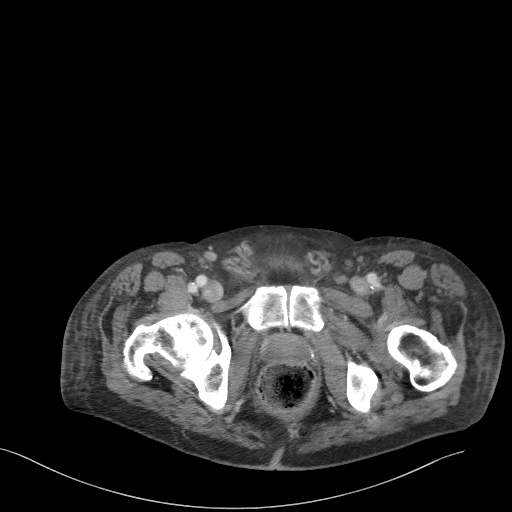
[im 28/105  soft-tissue]
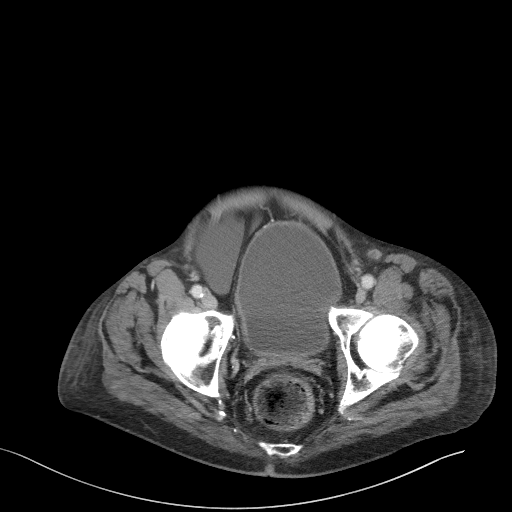
[im 39/105  soft-tissue]
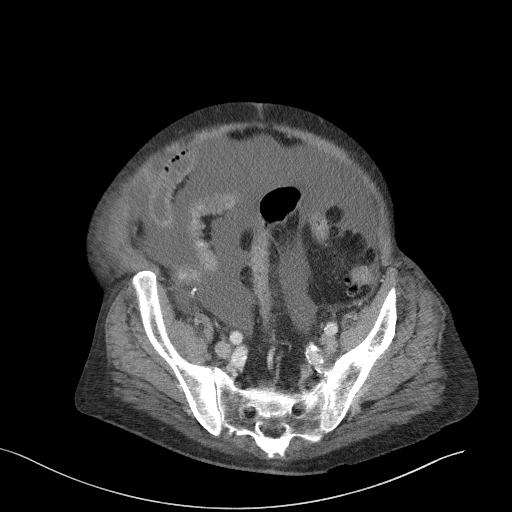
[im 44/105  soft-tissue]
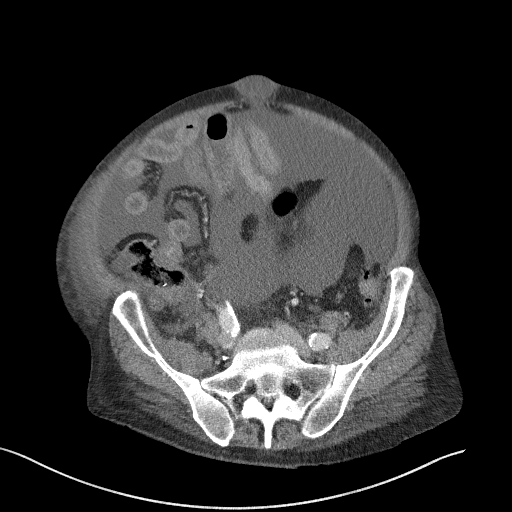
[im 55/105  soft-tissue]
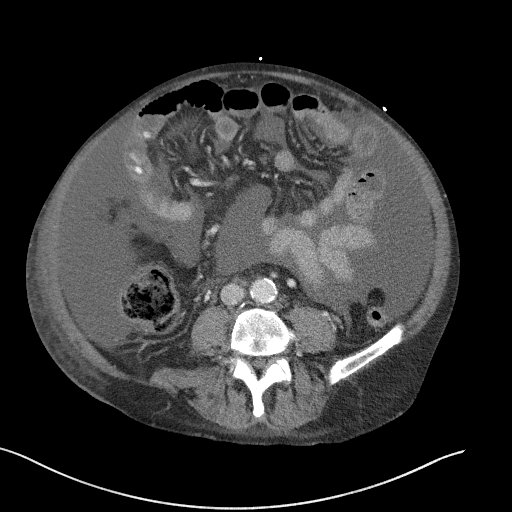
[im 61/105  soft-tissue]
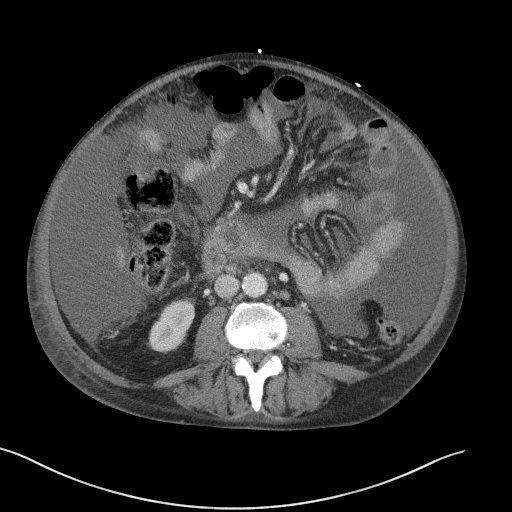
[im 66/105  soft-tissue]
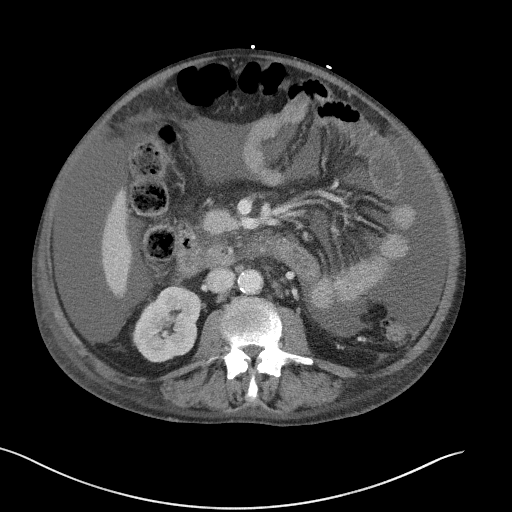
[im 66/105  bone]
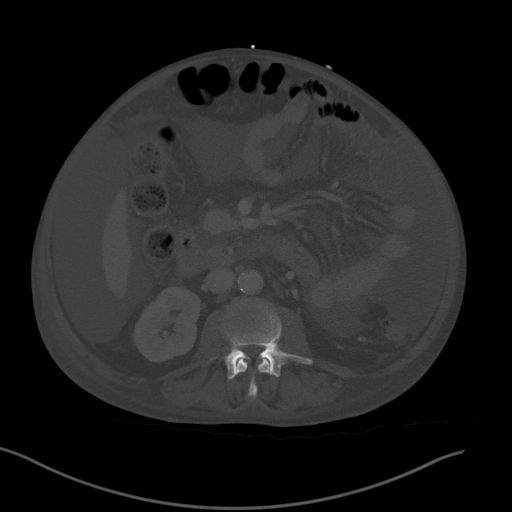
[im 77/105  soft-tissue]
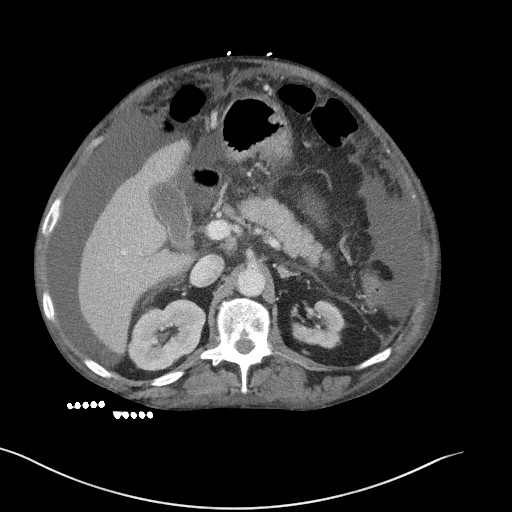
[im 83/105  soft-tissue]
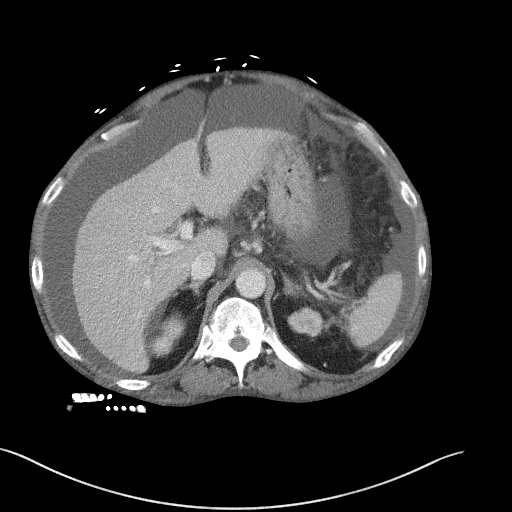
[im 88/105  soft-tissue]
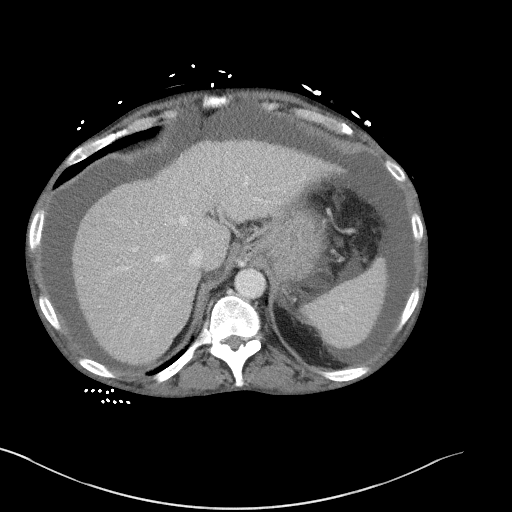
[im 99/105  soft-tissue]
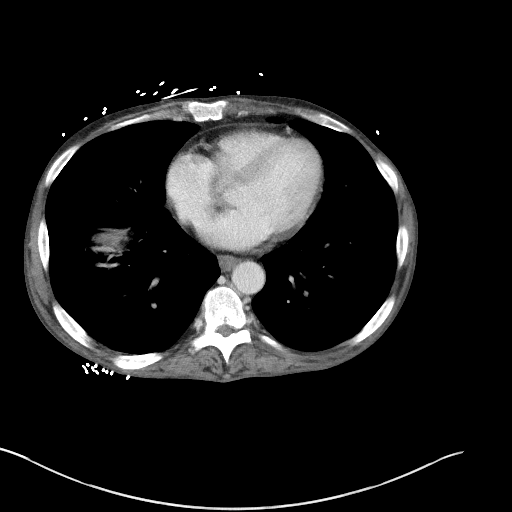

[Series 5: coronal st · coronal · 0.83mm/px · 3 of 123 slices shown]
[im 41/123  soft-tissue]
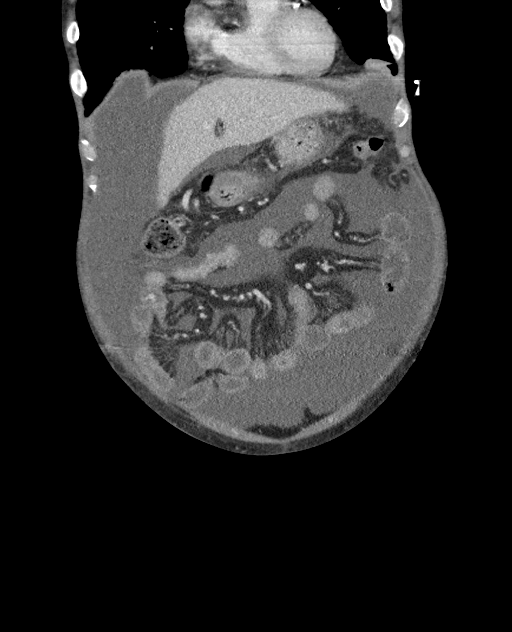
[im 55/123  soft-tissue]
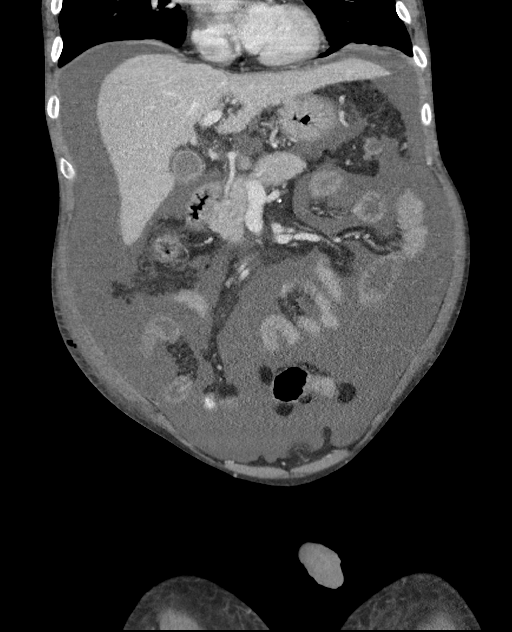
[im 68/123  soft-tissue]
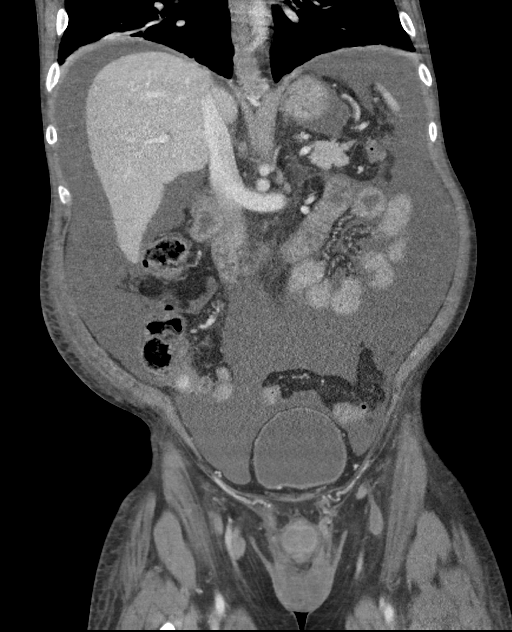

[16 of 46 positions shown; findings below may reference images not displayed]

RADIATION DOSE REDUCTION: This exam was performed according to the
departmental dose-optimization program which includes automated
exposure control, adjustment of the mA and/or kV according to
patient size and/or use of iterative reconstruction technique.

CONTRAST:  100mL OMNIPAQUE IOHEXOL 300 MG/ML  SOLN
FINDINGS: Lower chest: No acute abnormality.

Hepatobiliary: Liver is somewhat shrunken with mild nodularity
consistent with underlying cirrhosis.

Pancreas: Unremarkable. No pancreatic ductal dilatation or
surrounding inflammatory changes.

Spleen: Normal in size without focal abnormality.

Adrenals/Urinary Tract: Adrenal glands are within normal limits.
Right kidney is enlarged compared to the left. No calculi or
obstructive changes are seen. Normal excretion on delayed images is
noted. The bladder is well distended.

Stomach/Bowel: Scattered diverticular change of the colon is noted.
No obstructive or inflammatory changes are seen. The appendix has
been surgically removed. Small pending seal stump is noted. No
inflammatory changes are seen. Small bowel demonstrates some mild
wall thickening likely reactive to the underlying ascites.

Vascular/Lymphatic: Aortic atherosclerosis. No enlarged abdominal or
pelvic lymph nodes.

Reproductive: Prostate is unremarkable.

Other: Moderate to severe ascites is noted increased in the interval
from the prior exam.

Musculoskeletal: Degenerative changes of lumbar spine are seen.
IMPRESSION: Changes consistent with hepatic cirrhosis and ascites.

Diverticular change without diverticulitis.

Enlarged right kidney with respect to the left, similar to that
noted on the prior study.

## 2021-05-27 MED ORDER — POTASSIUM CHLORIDE CRYS ER 20 MEQ PO TBCR
40.0000 meq | EXTENDED_RELEASE_TABLET | Freq: Once | ORAL | Status: AC
  Administered 2021-05-27: 40 meq via ORAL
  Filled 2021-05-27: qty 2

## 2021-05-27 MED ORDER — LIDOCAINE HCL (PF) 1 % IJ SOLN
5.0000 mL | Freq: Once | INTRAMUSCULAR | Status: AC
  Administered 2021-05-27: 5 mL
  Filled 2021-05-27: qty 30

## 2021-05-27 MED ORDER — MAGNESIUM OXIDE -MG SUPPLEMENT 400 (240 MG) MG PO TABS
800.0000 mg | ORAL_TABLET | Freq: Once | ORAL | Status: AC
  Administered 2021-05-27: 800 mg via ORAL
  Filled 2021-05-27: qty 2

## 2021-05-27 NOTE — ED Triage Notes (Signed)
Pt BIB via EMS with c/o ABD pain that started about a week ago. Pt has hx of cirrhosis of the liver and has some ABD distention.

## 2021-05-27 NOTE — ED Provider Notes (Signed)
[facility] EMERGENCY DEPARTMENT Provider Note  CSN: [id number] Arrival date & time: 05/27/21 1629  Chief Complaint(s) Abdominal Pain  HPI DAJAUN GOLDRING is a 56 y.o. male with PMH cirrhosis, asthma, HTN, HLD, previous DVT, COPD who presents emergency department for evaluation of abdominal pain and swelling.  Patient states that over the last 1 week he has noticed significant abdominal swelling and associate abdominal pain.  He denies nausea, vomiting, chest pain, shortness of breath or other systemic symptoms.  Patient states he has never had a paracentesis before or had persistent abdominal swelling before.   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   Elevated ferritin 08/26/2020   Hypomagnesemia 08/11/2020   Hypoalbuminemia 08/11/2020   Hypotension 08/11/2020   Anemia of chronic disease 08/11/2020   DVT (deep venous thrombosis) -Lt LL Extensive DVT 54/27/0623   Alcoholic cirrhosis of liver with ascites ([place]) 08/05/2020   COPD (chronic obstructive pulmonary disease) ([place]) 08/05/2020   Hypokalemia 08/05/2020   Hepatic cirrhosis ([place]) 08/04/2020   Edema of left lower extremity 08/04/2020   Loss of weight 07/07/2020   Loss of appetite 07/07/2020   Anasarca 07/07/2020   Heme + stool 01/22/2017   Abnormal LFTs 01/22/2017   Hyperlipidemia 02/08/2015   Cigarette nicotine dependence with nicotine-induced disorder 02/08/2015   Leukocytosis, unspecified 01/07/2013   Hyponatremia 01/07/2013   Kidney stone 76/28/3151   Ureteral colic 76/16/0737   Home Medication(s) Prior to Admission medications   Medication Sig Start Date End Date Taking? Authorizing Provider  acetaminophen (TYLENOL) 500 MG tablet Take 1,000 mg by mouth every 6  (six) hours as needed. Patient not taking: Reported on 03/03/2021    [provider]  albuterol (PROVENTIL HFA) 108 (90 Base) MCG/ACT inhaler INHALE 2 PUFFS BY MOUTH EVERY 6 HOURS AS NEEDED FOR COUGHING, WHEEZING, OR SHORTNESS OF BREATH Patient not taking: Reported on 03/03/2021 02/10/21   Roxan Hockey, MD  amlodipine-atorvastatin (CADUET) 10-10 MG tablet Take 1 tablet by mouth daily.    [provider]  atorvastatin (LIPITOR) 20 MG tablet Take 20 mg by mouth daily. 02/27/21   [provider]  Cholecalciferol 50 MCG (2000 UT) CAPS Take 1 capsule (2,000 Units total) by mouth daily. 03/03/21   [name], PA-C  ELIQUIS 5 MG TABS tablet Take 5 mg by mouth 2 (two) times daily. 02/25/21   [provider]  folic acid (FOLVITE) 1 MG tablet Take 1 tablet (1 mg total) by mouth daily. 03/03/21   [name], PA-C  furosemide (LASIX) 40 MG tablet Take 40 mg by mouth.    [provider]  mometasone-formoterol (DULERA) 200-5 MCG/ACT AERO Inhale 2 puffs into the lungs 2 (two) times daily. 02/10/21   Roxan Hockey, MD  nicotine (NICODERM CQ - DOSED IN MG/24 HOURS) 14 mg/24hr patch Place 1 patch (14 mg total) onto the skin daily. 02/10/21   Roxan Hockey, MD  pantoprazole (PROTONIX) 40 MG tablet Take 1 tablet (40 mg total) by mouth daily. 02/10/21 02/10/22  Roxan Hockey, MD  potassium chloride SA (KLOR-CON) 20 MEQ tablet Take 3 tablets (60 mEq total) by mouth daily for 1 day, THEN 1 tablet (20 mEq total) daily for 7  days. 03/01/21 03/09/21  [name], PA-C  spironolactone (ALDACTONE) 50 MG tablet Take 50 mg by mouth daily.    [provider]                                                                                                                                    Past Surgical History Past Surgical History:  Procedure Laterality Date   APPENDECTOMY     BIOPSY  07/22/2020   Procedure: BIOPSY;  Surgeon: Daneil Dolin, MD;  Location: [facility];  Service: Endoscopy;;   COLONOSCOPY WITH PROPOFOL N/A 04/18/2017   non-bleeding internal hemorrhoids, two 4-6 mm polyps in descending colon and cecum, pancolonic diverticulosis, single cecal AVM. Tubular adenomas, surveillance in 2023.    ESOPHAGOGASTRODUODENOSCOPY (EGD) WITH PROPOFOL N/A 07/22/2020   normal esophagus, small hiatal hernia, abnormal gastric mucosa, abnormal appearing ampula and periampullary mucosa. Mild chronic gastritis.   FRACTURE SURGERY     left arm   LOWER EXTREMITY VENOGRAPHY N/A 08/10/2020   Procedure: LOWER EXTREMITY VENOGRAPHY;  Surgeon: Waynetta Sandy, MD;  Location: [facility];  Service: Cardiovascular;  Laterality: N/A;   PERIPHERAL VASCULAR BALLOON ANGIOPLASTY Left 08/10/2020   Procedure: PERIPHERAL VASCULAR BALLOON ANGIOPLASTY;  Surgeon: Waynetta Sandy, MD;  Location: [facility];  Service: Cardiovascular;  Laterality: Left;  lower extremity venous   PERIPHERAL VASCULAR THROMBECTOMY N/A 08/10/2020   Procedure: PERIPHERAL VASCULAR THROMBECTOMY;  Surgeon: Waynetta Sandy, MD;  Location: [facility];  Service: Cardiovascular;  Laterality: N/A;   POLYPECTOMY  04/18/2017   Procedure: POLYPECTOMY;  Surgeon: Daneil Dolin, MD;  Location: [facility];  Service: Endoscopy;;   Family History Family History  Problem Relation Age of Onset   Cancer Father        throat   Diabetes Sister    Colon cancer Neg Hx     Social History Social History   Tobacco Use   Smoking status: Every Day    Packs/day: 0.50    Years: 33.00    Pack years: 16.50    Types: Cigarettes   Smokeless tobacco: Never  Vaping Use   Vaping Use: Never used  Substance Use Topics   Alcohol use: No    Comment: history of ETOH use 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 33s, no ETOH for about 4 weeks as of 07/07/20   Drug use: No   Allergies Patient has no known allergies.  Review of Systems Review of  Systems  Gastrointestinal:  Positive for abdominal distention and abdominal pain.   Physical Exam Vital Signs  I have reviewed the triage vital signs BP 127/86    Pulse (!) 103    Temp 98.3 F (36.8 C) (Oral)    Resp 16    Ht 6' (1.829 m)    Wt 76.7 kg    SpO2 99%    BMI 22.92 kg/m  Physical Exam Vitals and nursing note reviewed.  Constitutional:      General: He is not in acute distress.    Appearance: He is well-developed.  HENT:     Head: Normocephalic and atraumatic.  Eyes:     Conjunctiva/sclera: Conjunctivae normal.  Cardiovascular:     Rate and Rhythm: Normal rate and regular rhythm.     Heart sounds: No murmur heard. Pulmonary:     Effort: Pulmonary effort is normal. No respiratory distress.     Breath sounds: Normal breath sounds.  Abdominal:     General: There is distension.     Palpations: Abdomen is soft.     Tenderness: There is generalized abdominal tenderness.  Musculoskeletal:        General: No swelling.     Cervical back: Neck supple.  Skin:    General: Skin is warm and dry.     Capillary Refill: Capillary refill takes less than 2 seconds.  Neurological:     Mental Status: He is alert.  Psychiatric:        Mood and Affect: Mood normal.    ED Results and Treatments Labs (all labs ordered are listed, but only abnormal results are displayed) Labs Reviewed  COMPREHENSIVE METABOLIC PANEL - Abnormal; Notable for the following components:      Result Value   Potassium 2.8 (*)    Glucose, Bld 117 (*)    BUN 30 (*)    Creatinine, Ser 1.32 (*)    Calcium 7.8 (*)    Albumin 1.7 (*)    Total Bilirubin 0.2 (*)    All other components within normal limits  CBC - Abnormal; Notable for the following components:   WBC 12.0 (*)    RBC 2.52 (*)    Hemoglobin 7.8 (*)    HCT 23.4 (*)    RDW 21.2 (*)    All other components within normal limits  LIPASE, BLOOD  URINALYSIS, ROUTINE W REFLEX MICROSCOPIC                                                                                                                           Radiology No results found.  Pertinent labs & imaging results that were available during my care of the patient were reviewed by me and considered in my medical decision making (see MDM for details).  Medications Ordered in ED Medications  potassium chloride SA (KLOR-CON M) CR tablet 40 mEq (40 mEq Oral Given 05/27/21 1840)  magnesium oxide (MAG-OX) tablet 800 mg (800 mg Oral Given 05/27/21 1840)  Procedures .Paracentesis  Date/Time: 05/27/2021 11:47 PM Performed by: Teressa Lower, MD Authorized by: Teressa Lower, MD   Consent:    Consent obtained:  Written   Consent given by:  Patient   Risks discussed:  Bleeding, infection, pain and bowel perforation   Alternatives discussed:  No treatment and delayed treatment Pre-procedure details:    Procedure purpose:  Diagnostic   Preparation: Patient was prepped and draped in usual sterile fashion   Anesthesia:    Anesthesia method:  Local infiltration   Local anesthetic:  Lidocaine 1% w/o epi Procedure details:    Needle gauge:  18   Ultrasound guidance: yes     Puncture site:  R lower quadrant   Fluid removed amount:  50 cc   Fluid appearance:  Serous   Dressing:  Adhesive bandage Post-procedure details:    Procedure completion:  Tolerated well, no immediate complications  (including critical care time)  Medical Decision Making / ED Course   This patient presents to the ED for concern of abdominal pain and swelling, this involves an extensive number of treatment options, and is a complaint that carries with it a high risk of complications and morbidity.  The differential diagnosis includes ascites, cirrhosis, SBP, renal failure  MDM: Patient seen emergency department for evaluation of abdominal pain and swelling.  Physical exam  reveals significant abdominal distention with a fluid wave concerning for ascites, generalized abdominal tenderness to palpation.  Laboratory evaluation with hypokalemia to 2.8, BUN 30, creatinine 1.37, mild leukocytosis to 12.0, hemoglobin 7.8 which is near the patient's baseline and likely downtrend in the setting of his persistent alcohol use.  A diagnostic paracentesis was performed that reassuringly shows a total cell count of 229 with only 5 neutrophils and is not consistent with SBP.  Potassium repleted here in the emergency department and CT pending at time of signout.  Please see provider signout for continuation of work-up.  If CT unremarkable, patient will be discharged with outpatient interventional radiology follow-up for therapeutic paracentesis, as we cannot admit the patient for this as we do not have US paracentesis services over the weekend.   Additional history obtained: -External records from outside source obtained and reviewed including: Chart review including previous notes, labs, imaging, consultation notes   Lab Tests: -I ordered, reviewed, and interpreted labs.   The pertinent results include:   Labs Reviewed  COMPREHENSIVE METABOLIC PANEL - Abnormal; Notable for the following components:      Result Value   Potassium 2.8 (*)    Glucose, Bld 117 (*)    BUN 30 (*)    Creatinine, Ser 1.32 (*)    Calcium 7.8 (*)    Albumin 1.7 (*)    Total Bilirubin 0.2 (*)    All other components within normal limits  CBC - Abnormal; Notable for the following components:   WBC 12.0 (*)    RBC 2.52 (*)    Hemoglobin 7.8 (*)    HCT 23.4 (*)    RDW 21.2 (*)    All other components within normal limits  LIPASE, BLOOD  URINALYSIS, ROUTINE W REFLEX MICROSCOPIC      Imaging Studies ordered: I ordered imaging studies including CTAP - pending at time of signout    Medicines ordered and prescription drug management: Meds ordered this encounter  Medications   potassium chloride  SA (KLOR-CON M) CR tablet 40 mEq   magnesium oxide (MAG-OX) tablet 800 mg    -I have reviewed the patients home medicines and  have made adjustments as needed  Critical interventions none  Consultations Obtained: I requested consultation with the interventional radiologist,  and discussed lab and imaging findings as well as pertinent plan - they recommend: If CT negative, outpt therapeutic paracentesis   Cardiac Monitoring: The patient was maintained on a cardiac monitor.  I personally viewed and interpreted the cardiac monitored which showed an underlying rhythm of: NSr  Social Determinants of Health:  Factors impacting patients care include: alcohol use   Reevaluation: After the interventions noted above, I reevaluated the patient and found that they have :improved  Co morbidities that complicate the patient evaluation  Past Medical History:  Diagnosis Date   Asthma    Dyspnea    High cholesterol    History of kidney stones    Hypertension    Kidney stones       Dispostion: I considered admission for this patient, but patient's disposition is pending CTAP.  Please see signout note for continuation of work-up     Final Clinical Impression(s) / ED Diagnoses Final diagnoses:  None     @PCDICTATION @    Elanore Talcott, Debe Coder, MD 05/27/21 2350

## 2021-05-28 LAB — URINALYSIS, ROUTINE W REFLEX MICROSCOPIC
Bilirubin Urine: NEGATIVE
Glucose, UA: NEGATIVE mg/dL
Ketones, ur: NEGATIVE mg/dL
Nitrite: POSITIVE — AB
Protein, ur: 30 mg/dL — AB
Specific Gravity, Urine: 1.01 (ref 1.005–1.030)
pH: 6 (ref 5.0–8.0)

## 2021-05-28 LAB — URINALYSIS, MICROSCOPIC (REFLEX)

## 2021-05-28 MED ORDER — IOHEXOL 300 MG/ML  SOLN
100.0000 mL | Freq: Once | INTRAMUSCULAR | Status: AC | PRN
  Administered 2021-05-28: 100 mL via INTRAVENOUS

## 2021-05-28 MED ORDER — CEFDINIR 300 MG PO CAPS: 300.0000 mg | ORAL_CAPSULE | Freq: Two times a day (BID) | ORAL | 0 refills | Status: DC

## 2021-05-28 MED ORDER — CEFTRIAXONE SODIUM 1 G IJ SOLR
1.0000 g | Freq: Once | INTRAMUSCULAR | Status: AC
  Administered 2021-05-28: 1 g via INTRAVENOUS
  Filled 2021-05-28: qty 10

## 2021-05-28 NOTE — ED Provider Notes (Signed)
Patient signed out to me at shift change.  Patient with history of cirrhosis and new abdominal pain with abdominal distention.  Diagnostic paracentesis did not suggest SBP.  CT scan has been performed.  It was pending at time of signout.  There were no acute findings other than cirrhosis and ascites.  Reviewing his labs, I do have some concern for possible urinary tract infection.  Urine culture is ordered.  We will give Rocephin here and discharged on outpatient antibiotic coverage.   Orpah Greek, MD 05/28/21 [phone]

## 2021-05-30 ENCOUNTER — Encounter (HOSPITAL_COMMUNITY): Payer: Self-pay | Admitting: Emergency Medicine

## 2021-05-30 ENCOUNTER — Emergency Department (HOSPITAL_COMMUNITY): Payer: Medicaid Other

## 2021-05-30 ENCOUNTER — Emergency Department (HOSPITAL_COMMUNITY)
Admission: EM | Admit: 2021-05-30 | Discharge: 2021-05-31 | Disposition: A | Discharge status: Home / Self Care | Payer: Medicaid Other | Attending: Emergency Medicine | Admitting: Emergency Medicine

## 2021-05-30 ENCOUNTER — Ambulatory Visit: Payer: Self-pay | Admitting: *Deleted

## 2021-05-30 ENCOUNTER — Other Ambulatory Visit: Payer: Self-pay

## 2021-05-30 DIAGNOSIS — M79605 Pain in left leg: Secondary | ICD-10-CM | POA: Insufficient documentation

## 2021-05-30 DIAGNOSIS — J45909 Unspecified asthma, uncomplicated: Secondary | ICD-10-CM | POA: Insufficient documentation

## 2021-05-30 DIAGNOSIS — M79604 Pain in right leg: Secondary | ICD-10-CM | POA: Diagnosis not present

## 2021-05-30 DIAGNOSIS — Z79899 Other long term (current) drug therapy: Secondary | ICD-10-CM | POA: Insufficient documentation

## 2021-05-30 DIAGNOSIS — I10 Essential (primary) hypertension: Secondary | ICD-10-CM | POA: Insufficient documentation

## 2021-05-30 DIAGNOSIS — Z7901 Long term (current) use of anticoagulants: Secondary | ICD-10-CM | POA: Diagnosis not present

## 2021-05-30 DIAGNOSIS — R1084 Generalized abdominal pain: Secondary | ICD-10-CM | POA: Diagnosis present

## 2021-05-30 DIAGNOSIS — K7031 Alcoholic cirrhosis of liver with ascites: Secondary | ICD-10-CM | POA: Diagnosis not present

## 2021-05-30 LAB — COMPREHENSIVE METABOLIC PANEL
ALT: 35 U/L (ref 0–44)
AST: 148 U/L — ABNORMAL HIGH (ref 15–41)
Albumin: 1.6 g/dL — ABNORMAL LOW (ref 3.5–5.0)
Alkaline Phosphatase: 174 U/L — ABNORMAL HIGH (ref 38–126)
Anion gap: 7 (ref 5–15)
BUN: 22 mg/dL — ABNORMAL HIGH (ref 6–20)
CO2: 24 mmol/L (ref 22–32)
Calcium: 8.1 mg/dL — ABNORMAL LOW (ref 8.9–10.3)
Chloride: 103 mmol/L (ref 98–111)
Creatinine, Ser: 1.46 mg/dL — ABNORMAL HIGH (ref 0.61–1.24)
GFR, Estimated: 56 mL/min — ABNORMAL LOW (ref >60)
Glucose, Bld: 107 mg/dL — ABNORMAL HIGH (ref 70–99)
Potassium: 4.1 mmol/L (ref 3.5–5.1)
Sodium: 134 mmol/L — ABNORMAL LOW (ref 135–145)
Total Bilirubin: 1.3 mg/dL — ABNORMAL HIGH (ref 0.3–1.2)
Total Protein: 7.3 g/dL (ref 6.5–8.1)

## 2021-05-30 LAB — CBC
HCT: 25.6 % — ABNORMAL LOW (ref 39.0–52.0)
Hemoglobin: 8.4 g/dL — ABNORMAL LOW (ref 13.0–17.0)
MCH: 30.9 pg (ref 26.0–34.0)
MCHC: 32.8 g/dL (ref 30.0–36.0)
MCV: 94.1 fL (ref 80.0–100.0)
Platelets: 445 10*3/uL — ABNORMAL HIGH (ref 150–400)
RBC: 2.72 MIL/uL — ABNORMAL LOW (ref 4.22–5.81)
RDW: 20 % — ABNORMAL HIGH (ref 11.5–15.5)
WBC: 11.7 10*3/uL — ABNORMAL HIGH (ref 4.0–10.5)
nRBC: 0 % (ref 0.0–0.2)

## 2021-05-30 LAB — LIPASE, BLOOD: Lipase: 45 U/L (ref 11–51)

## 2021-05-30 NOTE — ED Provider Triage Note (Signed)
Emergency Medicine Provider Triage Evaluation Note  [name] [name] , a 56 y.o. male  was evaluated in triage.  Pt complains of abdominal pain, tightness and distention.  Patient was seen on 1/27 at any [facility] for similar symptoms, and had a paracentesis done.  He states that he had no improvement of his symptoms after this.  Review of Systems  Positive: Abdominal pain, nausea, vomiting, diarrhea, shortness of breath, bilateral leg swelling Negative: Chest pain, fever, cough  Physical Exam  BP 125/90    Pulse (!) 119    Temp 98.8 F (37.1 C) (Oral)    Resp 16    Ht 6' (1.829 m)    Wt 76.7 kg    SpO2 99%    BMI 22.92 kg/m  Gen:   Awake, no distress   Resp:  Normal effort  MSK:   Moves extremities without difficulty  Other:    Medical Decision Making  Medically screening exam initiated at 6:10 PM.  Appropriate orders placed.  [name] was informed that the remainder of the evaluation will be completed by another provider, this initial triage assessment does not replace that evaluation, and the importance of remaining in the ED until their evaluation is complete.     Kateri Plummer, PA-C 05/30/21 1812

## 2021-05-30 NOTE — ED Triage Notes (Signed)
Patient c/o abdominal pain, tightness and distention. Abdomen appears swollen. C/o leg cramping. Reports eating normal and drinking plenty of fluids. States he has cirrhosis and went to [facility] recently but was told they could not help him.

## 2021-05-30 NOTE — Telephone Encounter (Signed)
° ° °  Reason for Disposition  [1] SEVERE pain (e.g., excruciating) AND [2] present > 1 hour  Answer Assessment - Initial Assessment Questions 1. LOCATION: "Where does it hurt?"      Entire 2. RADIATION: "Does the pain shoot anywhere else?" (e.g., chest, back)      3. ONSET: "When did the pain begin?" (Minutes, hours or days ago)      Weeks ago 4. SUDDEN: "Gradual or sudden onset?"     Sharp pains 5. PATTERN "Does the pain come and go, or is it constant?"    - If constant: "Is it getting better, staying the same, or worsening?"      (Note: Constant means the pain never goes away completely; most serious pain is constant and it progresses)     - If intermittent: "How long does it last?" "Do you have pain now?"     (Note: Intermittent means the pain goes away completely between bouts)      6. SEVERITY: "How bad is the pain?"  (e.g., Scale 1-10; mild, moderate, or severe)    - MILD (1-3): doesn't interfere with normal activities, abdomen soft and not tender to touch     - MODERATE (4-7): interferes with normal activities or awakens from sleep, abdomen tender to touch     - SEVERE (8-10): excruciating pain, doubled over, unable to do any normal activities       9/10 7. RECURRENT SYMPTOM: "Have you ever had this type of stomach pain before?" If Yes, ask: "When was the last time?" and "What happened that time?"       8. CAUSE: "What do you think is causing the stomach pain?"  9. RELIEVING/AGGRAVATING FACTORS: "What makes it better or worse?" (e.g., movement, antacids, bowel movement)     10. OTHER SYMPTOMS: "Do you have any other symptoms?" (e.g., back pain, diarrhea, fever, urination pain, vomiting)       Weakness, stomach hard distended. Diarrhea  Protocols used: Abdominal Pain - Male-A-AH

## 2021-05-30 NOTE — Telephone Encounter (Signed)
Pt reports 9/10 abdominal pain. In ED 05/27/21. States stomach distended. Advised to alert PCP, ED.

## 2021-05-31 ENCOUNTER — Emergency Department (HOSPITAL_COMMUNITY): Payer: Medicaid Other

## 2021-05-31 HISTORY — PX: IR PARACENTESIS: IMG2679

## 2021-05-31 LAB — URINALYSIS, MICROSCOPIC (REFLEX)

## 2021-05-31 LAB — PATHOLOGIST SMEAR REVIEW

## 2021-05-31 LAB — URINE CULTURE

## 2021-05-31 LAB — GRAM STAIN

## 2021-05-31 LAB — URINALYSIS, ROUTINE W REFLEX MICROSCOPIC
Glucose, UA: NEGATIVE mg/dL
Hgb urine dipstick: NEGATIVE
Ketones, ur: NEGATIVE mg/dL
Nitrite: NEGATIVE
Protein, ur: 30 mg/dL — AB
Specific Gravity, Urine: 1.015 (ref 1.005–1.030)
pH: 5.5 (ref 5.0–8.0)

## 2021-05-31 LAB — BODY FLUID CELL COUNT WITH DIFFERENTIAL
Eos, Fluid: 0 %
Lymphs, Fluid: 59 %
Monocyte-Macrophage-Serous Fluid: 27 % — ABNORMAL LOW (ref 50–90)
Neutrophil Count, Fluid: 14 % (ref 0–25)
Total Nucleated Cell Count, Fluid: 273 cu mm (ref 0–1000)

## 2021-05-31 LAB — GLUCOSE, PLEURAL OR PERITONEAL FLUID: Glucose, Fluid: 101 mg/dL

## 2021-05-31 IMAGING — US IR PARACENTESIS
1 series · 5 of 5 positions shown · non-contrast
Comparison: none

INDICATION: EXAM:
ULTRASOUND GUIDED RLQ PARACENTESIS

[Series 1: ir (person_name)/(person_name) · 5 of 5 slices shown]
[im 1/5]
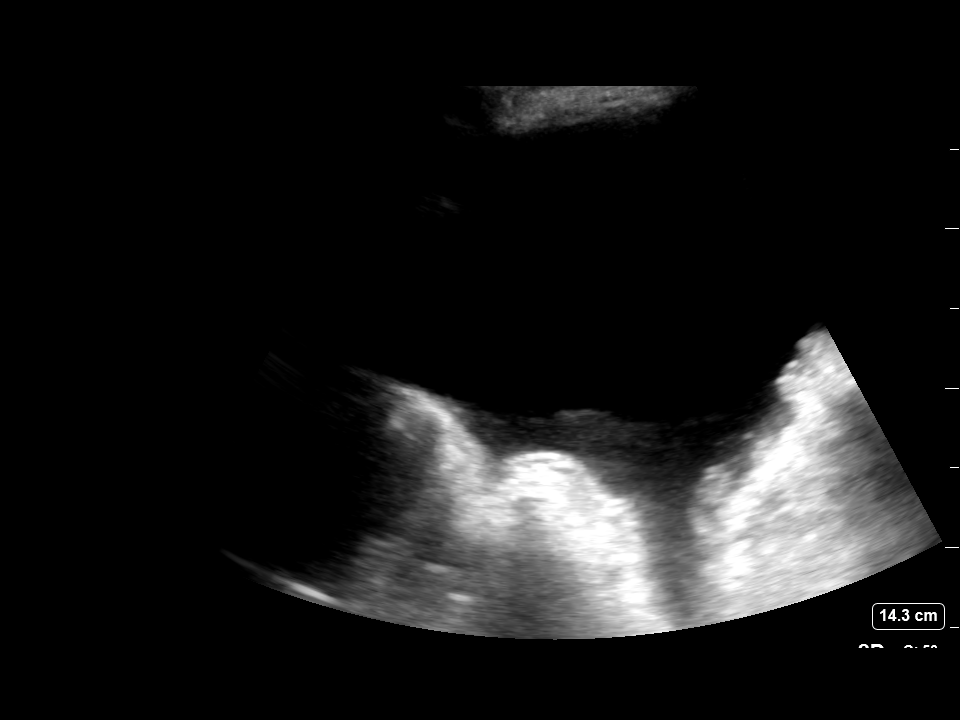
[im 2/5]
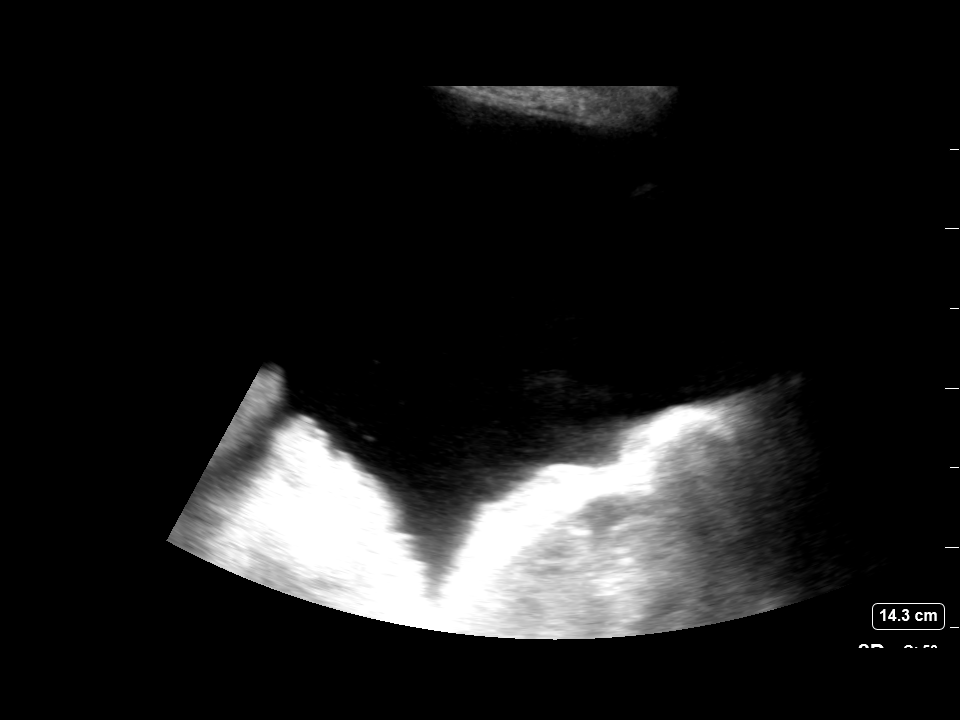
[im 3/5]
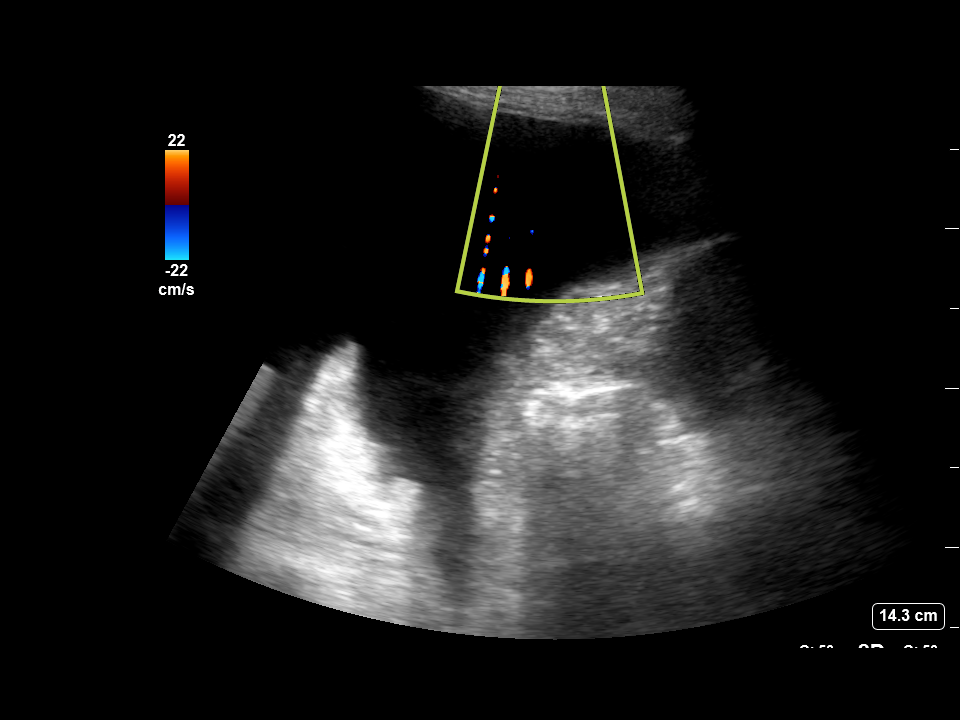
[im 4/5]
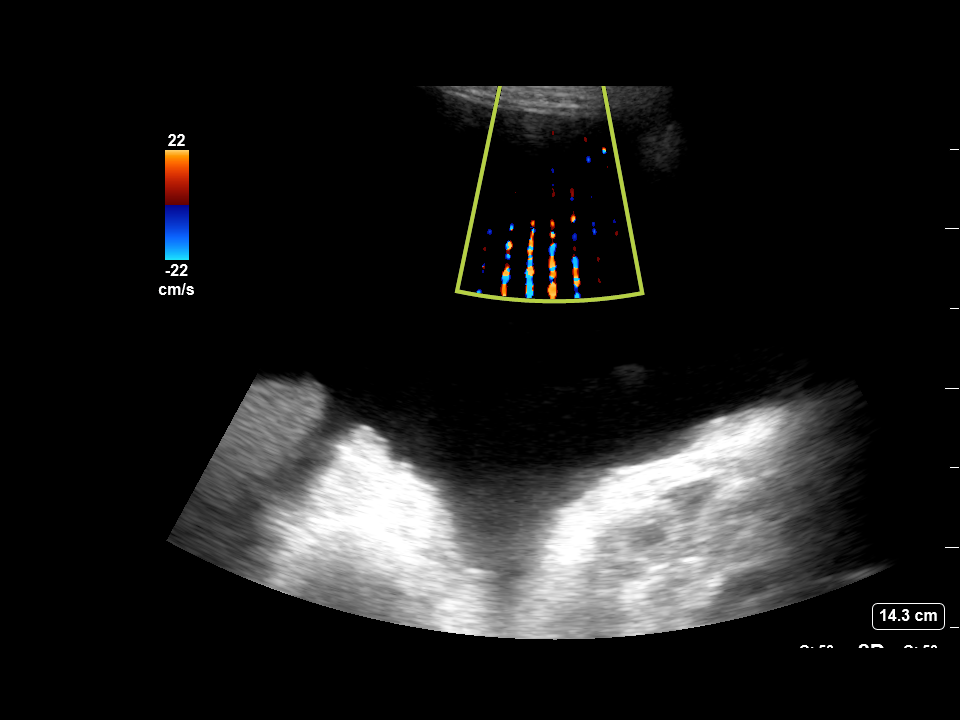
[im 5/5]
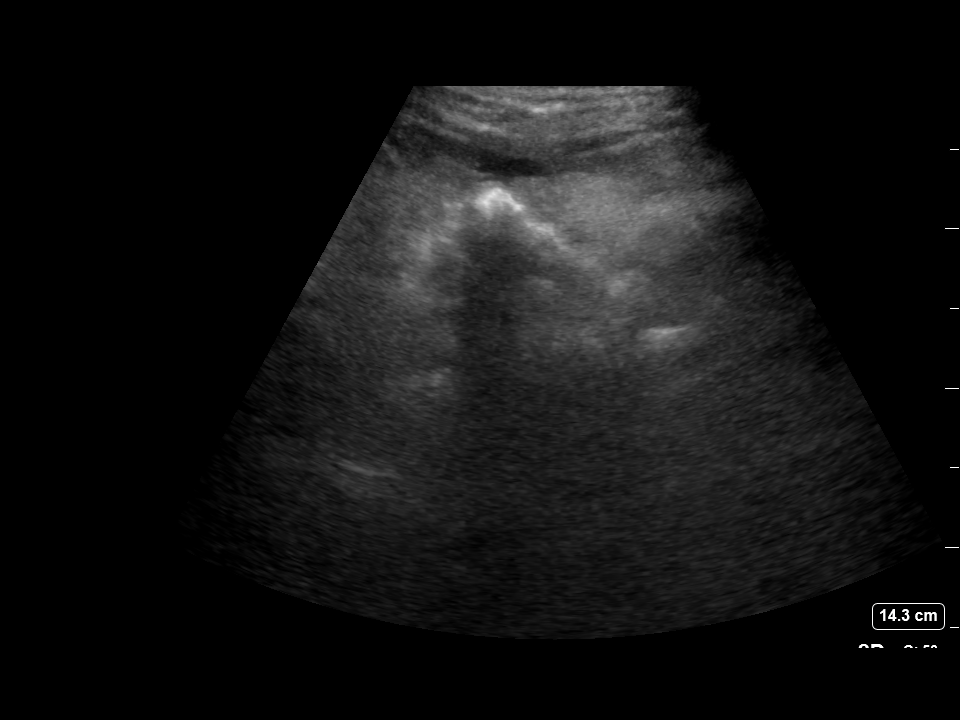

[5 of 5 positions shown; findings below may reference images not displayed]

MEDICATIONS:
None.

COMPLICATIONS:
None immediate.

PROCEDURE:
Informed written consent was obtained from the patient after a
discussion of the risks, benefits and alternatives to treatment. A
timeout was performed prior to the initiation of the procedure.

Initial ultrasound scanning demonstrates a large amount of ascites
within the right lower abdominal quadrant. The right lower abdomen
was prepped and draped in the usual sterile fashion. 1% lidocaine
was used for local anesthesia.

Following this, a 19 gauge, 7-cm, Yueh catheter was introduced. An
ultrasound image was saved for documentation purposes. The
paracentesis was performed. The catheter was removed and a dressing
was applied. The patient tolerated the procedure well without
immediate post procedural complication.
Patient received post-procedure intravenous albumin; see nursing
notes for details.
FINDINGS: A total of approximately 6L of cloudy yellow fluid was removed.
Samples were sent to the laboratory as requested by the clinical
team.
IMPRESSION: Successful ultrasound-guided paracentesis yielding 6.0 liters of
peritoneal fluid.

Performed and Read by ENICK

## 2021-05-31 MED ORDER — FENTANYL CITRATE PF 50 MCG/ML IJ SOSY
50.0000 ug | PREFILLED_SYRINGE | Freq: Once | INTRAMUSCULAR | Status: AC
Start: 2021-05-31 — End: 2021-05-31
  Administered 2021-05-31: 50 ug via INTRAVENOUS
  Filled 2021-05-31: qty 1

## 2021-05-31 MED ORDER — LIDOCAINE HCL 1 % IJ SOLN
INTRAMUSCULAR | Status: AC
  Filled 2021-05-31: qty 20

## 2021-05-31 MED ORDER — ALBUMIN HUMAN 25 % IV SOLN
12.5000 g | Freq: Once | INTRAVENOUS | Status: AC
  Administered 2021-05-31: 12.5 g via INTRAVENOUS
  Filled 2021-05-31: qty 50

## 2021-05-31 NOTE — ED Notes (Signed)
Pt not in room at this time

## 2021-05-31 NOTE — Procedures (Signed)
PROCEDURE SUMMARY:  Successful US guided paracentesis from RLQ.  Yielded 6L of yellow cloudy fluid.  No immediate complications.  Pt tolerated well.   Specimen was sent for labs.  EBL < 34mL  Willian Donson PA-C 05/31/2021 11:44 AM

## 2021-05-31 NOTE — ED Notes (Signed)
Reviewed discharge instructions with patient. Follow-up care reviewed. Patient verbalized understanding. Patient A&Ox4, VSS, and ambulatory with steady gait upon discharge.  

## 2021-05-31 NOTE — ED Notes (Signed)
Tech advised pt he needs to try to urinate.

## 2021-05-31 NOTE — ED Notes (Signed)
Pt attempting to provide urine specimen at this time.  

## 2021-05-31 NOTE — ED Notes (Signed)
Received phone call from transporter at this time that pt is being transported to IR

## 2021-05-31 NOTE — ED Provider Notes (Signed)
[facility] EMERGENCY DEPARTMENT Provider Note   CSN: [id number] Arrival date & time: 05/30/21  1733     History  Chief Complaint  Patient presents with   Abdominal Pain    [name] [name] is a 56 y.o. male.  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 MRI/MRCP with subtle prominence of ampulla without well-defined mass, Pancreas divisum, recent evaluation 4 days ago in the emergency department with diagnostic paracentesis, culture reviewed without signs of SBP, CT abdomen pelvis with signs of ascites and cirrhosis without other acute abnormalities, Rocephin and antibiotic given for possible UTI who presents with concern for continuing diffuse pain including his abdomen and legs.  Presents with severe diffuse pain, present for 2-3 weeks, including abdominal pain. Pain worsening, difficulty sleeping at times due to the abdominal pain.  Has only been taking Tylenol as he was told he cannot take anything else for pain.  Diarrhea, light brown color began a few days ago, about 10 per day No fevers or chills No nausea or vomiting   Diarrhea and severe pain  Has not had hx of therapeutic paracentesis Had some dyspnea at night, uses an inhaler which helps, has had increased leg swelling, no other dyspnea, no chest pain. Hx of bilateral leg pain for several months  No pain with urination  Pain everywhere 10/10, legs abdomen   Past Medical History:  Diagnosis Date   Asthma    Dyspnea    High cholesterol    History of kidney stones    Hypertension    Kidney stones    Past Surgical History:  Procedure Laterality Date    APPENDECTOMY     BIOPSY  07/22/2020   Procedure: BIOPSY;  Surgeon: Daneil Dolin, MD;  Location: [facility];  Service: Endoscopy;;   COLONOSCOPY WITH PROPOFOL N/A 04/18/2017   non-bleeding internal hemorrhoids, two 4-6 mm polyps in descending colon and cecum, pancolonic diverticulosis, single cecal AVM. Tubular adenomas, surveillance in 2023.    ESOPHAGOGASTRODUODENOSCOPY (EGD) WITH PROPOFOL N/A 07/22/2020   normal esophagus, small hiatal hernia, abnormal gastric mucosa, abnormal appearing ampula and periampullary mucosa. Mild chronic gastritis.   FRACTURE SURGERY     left arm   IR PARACENTESIS  05/31/2021   LOWER EXTREMITY VENOGRAPHY N/A 08/10/2020   Procedure: LOWER EXTREMITY VENOGRAPHY;  Surgeon: Waynetta Sandy, MD;  Location: [facility];  Service: Cardiovascular;  Laterality: N/A;   PERIPHERAL VASCULAR BALLOON ANGIOPLASTY Left 08/10/2020   Procedure: PERIPHERAL VASCULAR BALLOON ANGIOPLASTY;  Surgeon: Waynetta Sandy, MD;  Location: [place] [facility];  Service: Cardiovascular;  Laterality: Left;  lower extremity venous   PERIPHERAL VASCULAR THROMBECTOMY N/A 08/10/2020   Procedure: PERIPHERAL VASCULAR THROMBECTOMY;  Surgeon: Waynetta Sandy, MD;  Location: [place] [facility];  Service: Cardiovascular;  Laterality: N/A;   POLYPECTOMY  04/18/2017   Procedure: POLYPECTOMY;  Surgeon: Daneil Dolin, MD;  Location: [facility];  Service: Endoscopy;;    Home Medications Prior to Admission medications   Medication Sig Start Date End Date Taking? Authorizing Provider  acetaminophen (TYLENOL) 500 MG tablet Take 1,000 mg by mouth every 6 (six) hours as needed. Patient not taking: Reported on 03/03/2021    [provider]  albuterol (PROVENTIL HFA) 108 (90 Base) MCG/ACT inhaler INHALE 2 PUFFS BY MOUTH EVERY 6 HOURS AS NEEDED FOR COUGHING, WHEEZING, OR SHORTNESS OF BREATH Patient not taking: Reported on 03/03/2021 02/10/21   Roxan Hockey, MD   amlodipine-atorvastatin (CADUET) 10-10 MG tablet Take 1 tablet by mouth daily.    [provider]  atorvastatin (LIPITOR) 20 MG tablet Take 20 mg by mouth daily. 02/27/21   [provider]  cefdinir (OMNICEF) 300 MG capsule Take 1 capsule (300 mg total) by mouth 2 (two) times daily. 05/28/21   Orpah Greek, MD  Cholecalciferol 50 MCG (2000 UT) CAPS Take 1 capsule (2,000 Units total) by mouth daily. 03/03/21   [name], PA-C  ELIQUIS 5 MG TABS tablet Take 5 mg by mouth 2 (two) times daily. 02/25/21   [provider]  folic acid (FOLVITE) 1 MG tablet Take 1 tablet (1 mg total) by mouth daily. 03/03/21   [name], PA-C  furosemide (LASIX) 40 MG tablet Take 40 mg by mouth.    [provider]  mometasone-formoterol (DULERA) 200-5 MCG/ACT AERO Inhale 2 puffs into the lungs 2 (two) times daily. 02/10/21   Roxan Hockey, MD  nicotine (NICODERM CQ - DOSED IN MG/24 HOURS) 14 mg/24hr patch Place 1 patch (14 mg total) onto the skin daily. 02/10/21   Roxan Hockey, MD  pantoprazole (PROTONIX) 40 MG tablet Take 1 tablet (40 mg total) by mouth daily. 02/10/21 02/10/22  Roxan Hockey, MD  potassium chloride SA (KLOR-CON) 20 MEQ tablet Take 3 tablets (60 mEq total) by mouth daily for 1 day, THEN 1 tablet (20 mEq total) daily for 7 days. 03/01/21 03/09/21  [name], PA-C  spironolactone (ALDACTONE) 50 MG tablet Take 50 mg by mouth daily.    [provider]      Allergies    Patient has no known allergies.    Review of Systems   Review of Systems  See above  Physical Exam Updated Vital Signs BP 124/84 (BP Location: Right Arm)    Pulse (!) 105    Temp 98.7 F (37.1 C) (Oral)    Resp 20    Ht 6' (1.829 m)    Wt 76.7 kg    SpO2 99%    BMI 22.92 kg/m  Physical Exam Vitals and nursing note reviewed.  Constitutional:      General: He is not in acute distress.    Appearance: He is well-developed. He is not  diaphoretic.  HENT:     Head: Normocephalic and atraumatic.  Eyes:     Conjunctiva/sclera: Conjunctivae normal.  Cardiovascular:     Rate and Rhythm: Normal rate and regular rhythm.     Heart sounds: Normal heart sounds. No murmur heard.   No friction rub. No gallop.  Pulmonary:     Effort: Pulmonary effort is normal. No respiratory distress.     Breath sounds: Normal breath sounds. No wheezing or rales.  Abdominal:     General: Abdomen is protuberant. There is distension.     Palpations: Abdomen is soft. There is fluid wave.     Tenderness: There is generalized abdominal tenderness. There is no guarding.  Musculoskeletal:     Cervical back: Normal range of motion.     Comments: 1-2+ bilateral LE edema  Skin:    General: Skin is warm and dry.  Neurological:     Mental Status: He is alert and oriented to person, place, and time.  ED Results / Procedures / Treatments   Labs (all labs ordered are listed, but only abnormal results are displayed) Labs Reviewed  COMPREHENSIVE METABOLIC PANEL - Abnormal; Notable for the following components:      Result Value   Sodium 134 (*)    Glucose, Bld 107 (*)    BUN 22 (*)    Creatinine, Ser 1.46 (*)    Calcium 8.1 (*)    Albumin 1.6 (*)    AST 148 (*)    Alkaline Phosphatase 174 (*)    Total Bilirubin 1.3 (*)    GFR, Estimated 56 (*)    All other components within normal limits  CBC - Abnormal; Notable for the following components:   WBC 11.7 (*)    RBC 2.72 (*)    Hemoglobin 8.4 (*)    HCT 25.6 (*)    RDW 20.0 (*)    Platelets 445 (*)    All other components within normal limits  URINALYSIS, ROUTINE W REFLEX MICROSCOPIC - Abnormal; Notable for the following components:   Bilirubin Urine SMALL (*)    Protein, ur 30 (*)    Leukocytes,Ua SMALL (*)    All other components within normal limits  URINALYSIS, MICROSCOPIC (REFLEX) - Abnormal; Notable for the following components:   Bacteria, UA RARE (*)    Non Squamous Epithelial  PRESENT (*)    All other components within normal limits  BODY FLUID CELL COUNT WITH DIFFERENTIAL - Abnormal; Notable for the following components:   Color, Fluid YELLOW (*)    Appearance, Fluid HAZY (*)    Monocyte-Macrophage-Serous Fluid 27 (*)    All other components within normal limits  GRAM STAIN  CULTURE, BODY FLUID W GRAM STAIN -BOTTLE  LIPASE, BLOOD  GLUCOSE, PLEURAL OR PERITONEAL FLUID  PATHOLOGIST SMEAR REVIEW    EKG None  Radiology DG Chest 2 View  Result Date: 05/30/2021 CLINICAL DATA:  Shortness of breath EXAM: CHEST - 2 VIEW COMPARISON:  None. FINDINGS: The heart size and mediastinal contours are within normal limits. Both lungs are clear. No pleural effusion or pneumothorax. The visualized skeletal structures are unremarkable. IMPRESSION: No active cardiopulmonary disease. Electronically Signed   By: Macy Mis M.D.   On: 05/30/2021 19:03   IR Paracentesis  Result Date: 05/31/2021 INDICATION: Ascites, causing SOB EXAM: ULTRASOUND GUIDED RLQ PARACENTESIS MEDICATIONS: None. COMPLICATIONS: None immediate. PROCEDURE: Informed written consent was obtained from the patient after a discussion of the risks, benefits and alternatives to treatment. A timeout was performed prior to the initiation of the procedure. Initial ultrasound scanning demonstrates a large amount of ascites within the right lower abdominal quadrant. The right lower abdomen was prepped and draped in the usual sterile fashion. 1% lidocaine was used for local anesthesia. Following this, a 19 gauge, 7-cm, Yueh catheter was introduced. An ultrasound image was saved for documentation purposes. The paracentesis was performed. The catheter was removed and a dressing was applied. The patient tolerated the procedure well without immediate post procedural complication. Patient received post-procedure intravenous albumin; see nursing notes for details. FINDINGS: A total of approximately 6L of cloudy yellow fluid was  removed. Samples were sent to the laboratory as requested by the clinical team. IMPRESSION: Successful ultrasound-guided paracentesis yielding 6.0 liters of peritoneal fluid. Performed and Read by Pasty Spillers, PA-C Electronically Signed   By: Jerilynn Mages.  Shick M.D.   On: 05/31/2021 13:04    Procedures Procedures    Medications Ordered in ED Medications  lidocaine (XYLOCAINE) 1 % (with pres) injection (  has no administration in time range)  fentaNYL (SUBLIMAZE) injection 50 mcg (50 mcg Intravenous Given 05/31/21 0926)  albumin human 25 % solution 12.5 g (0 g Intravenous Stopped 05/31/21 1238)    ED Course/ Medical Decision Making/ A&P                           Medical Decision Making Amount and/or Complexity of Data Reviewed Labs: ordered.  Risk Prescription drug management.   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 MRI/MRCP with subtle prominence of ampulla without well-defined mass, Pancreas divisum, recent evaluation 4 days ago in the emergency department with diagnostic paracentesis, culture reviewed without signs of SBP, CT abdomen pelvis with signs of ascites and cirrhosis without other acute abnormalities, Rocephin and antibiotic given for possible UTI who presents with concern for continuing diffuse pain including his abdomen and legs.  Leg pain is chronic and not acutely changed, has palpable pulses bilaterally doubt acute arterial thrombus.  Given chronicity of symptoms do not suspect acute DVT  Reviewed recent visit, have low suspicion for new development of other intraabdominal findings. New SBP seems unlikely in setting of normal culture from 4 days  ago, no fever, no increased leukocytosis.  Labs with increase in bili from 0.2-1.3, alk phos up to 174, AST 148.  Does not have specific right upper quadrant pain or tenderness to suggest cholecystitis.  Suspect his abdominal discomfort is secondary to his significant ascites.  Has some dyspnea when laying flat but he does not have any acute shortness of breath and have low suspicion at this time for pulmonary embolus.    Called IR regarding significant ascites and they were able to perform therapeutic paracentesis. Culture sent again, no significant neutrophil count on cell count.  Given albumin given hypoalbuminemia.    On reevaluation, he is significantly improved.  He reports improvement of pain, is tolerating p.o.  His heart rate has decreased to the 100s, noted to have some tachycardia at last visit.  Considered admission given initial presentation, however is feeling improved, no signs of infection, and feel he is appropriate for close GI follow for his worsening complications of cirrhosis.            Final Clinical Impression(s) / ED Diagnoses Final diagnoses:  Ascites due to alcoholic cirrhosis ([place])  Generalized abdominal pain  Bilateral leg pain    Rx / DC Orders ED Discharge Orders     None         Gareth Morgan, MD 05/31/21 1652

## 2021-06-01 LAB — CULTURE, BODY FLUID W GRAM STAIN -BOTTLE: Culture: NO GROWTH

## 2021-06-01 LAB — PATHOLOGIST SMEAR REVIEW

## 2021-06-03 ENCOUNTER — Other Ambulatory Visit: Payer: Self-pay

## 2021-06-03 ENCOUNTER — Ambulatory Visit (HOSPITAL_COMMUNITY)
Admission: RE | Admit: 2021-06-03 | Discharge: 2021-06-03 | Disposition: A | Discharge status: Home / Self Care | Payer: Medicaid Other | Source: Ambulatory Visit | Attending: Gerontology | Admitting: Gerontology

## 2021-06-03 DIAGNOSIS — R188 Other ascites: Secondary | ICD-10-CM | POA: Diagnosis not present

## 2021-06-03 IMAGING — US US ABDOMEN COMPLETE
1 series · 13 of 25 positions shown · non-contrast
Comparison: None.

CLINICAL DATA: Alcoholic cirrhosis, ascites

EXAM:
ABDOMEN ULTRASOUND COMPLETE

[Series 1: us abdomen complete · 13 of 91 slices shown]
[im 1/91]
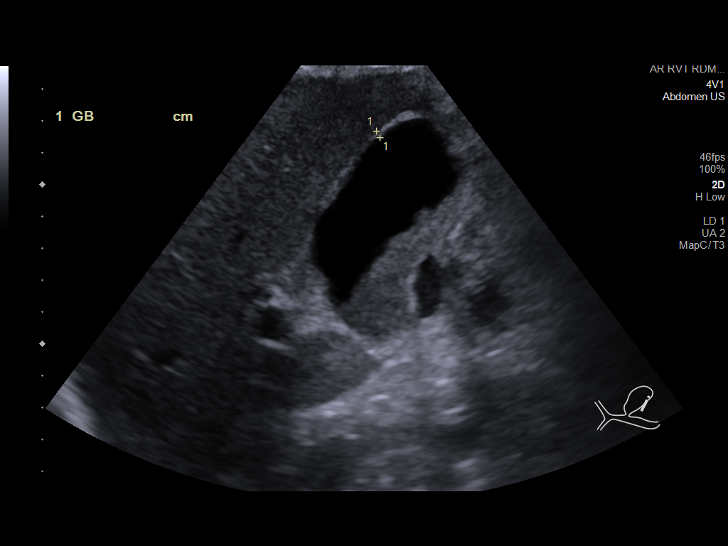
[im 8/91]
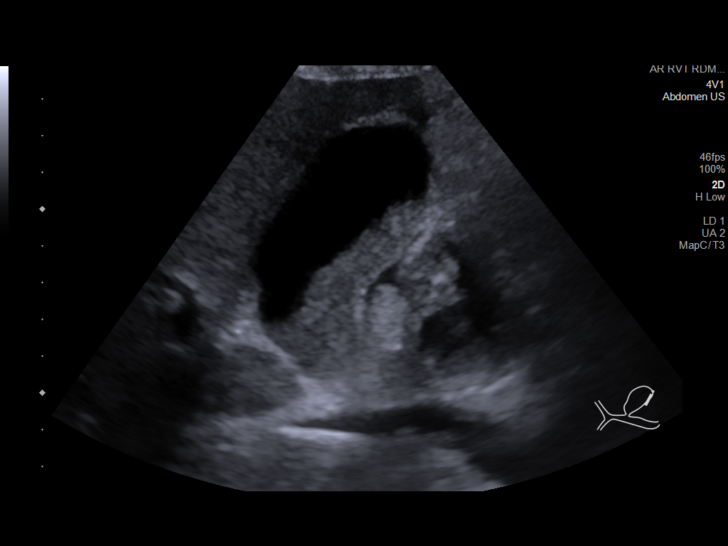
[im 16/91]
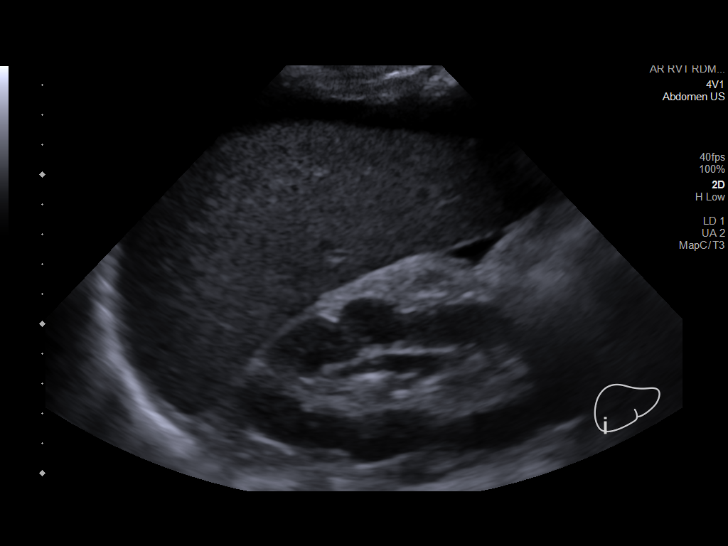
[im 23/91]
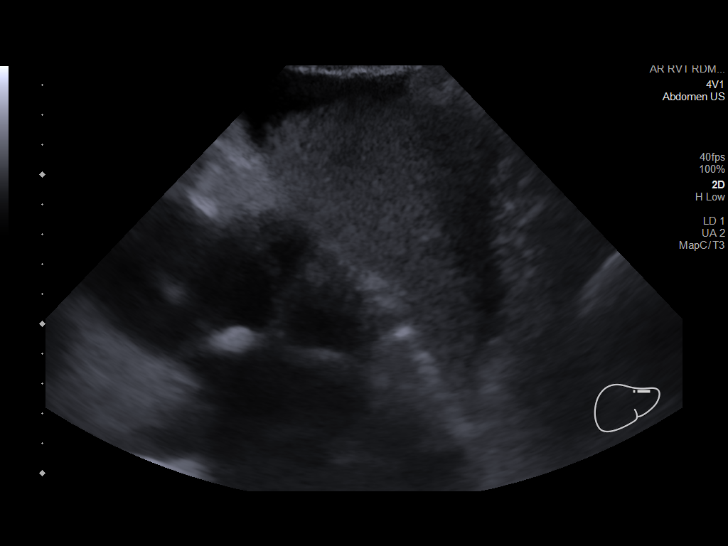
[im 31/91]
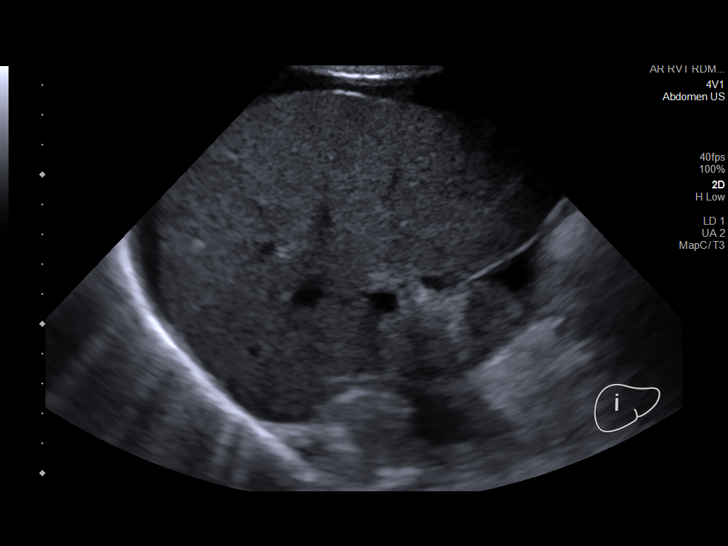
[im 38/91]
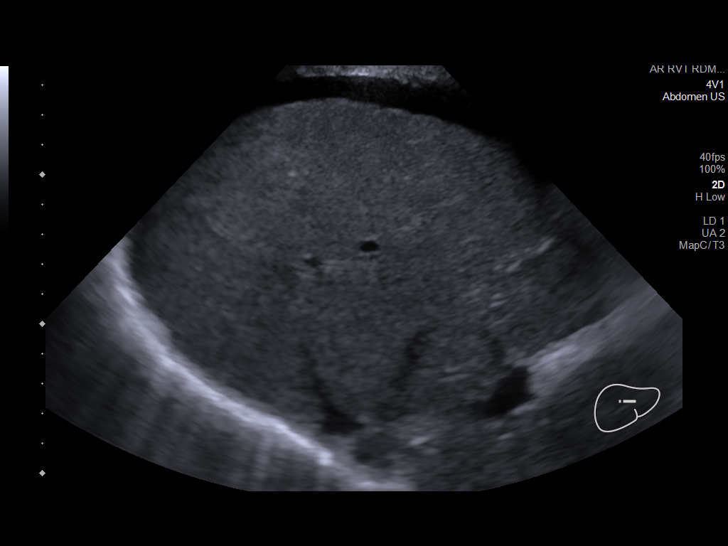
[im 46/91]
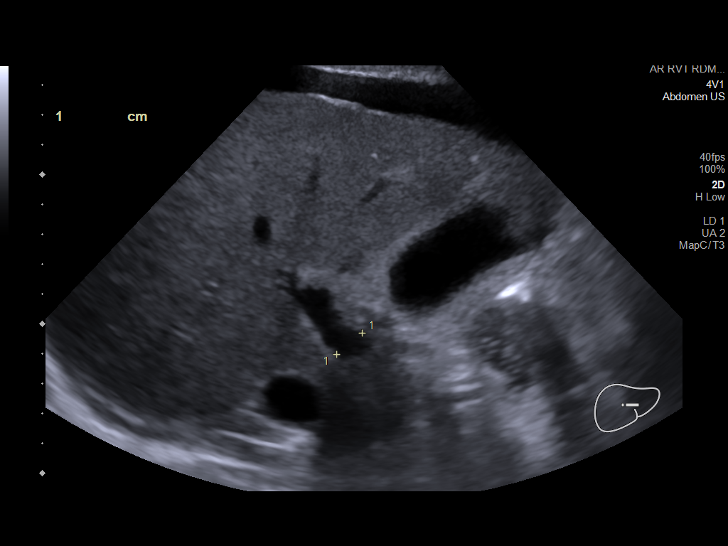
[im 53/91]
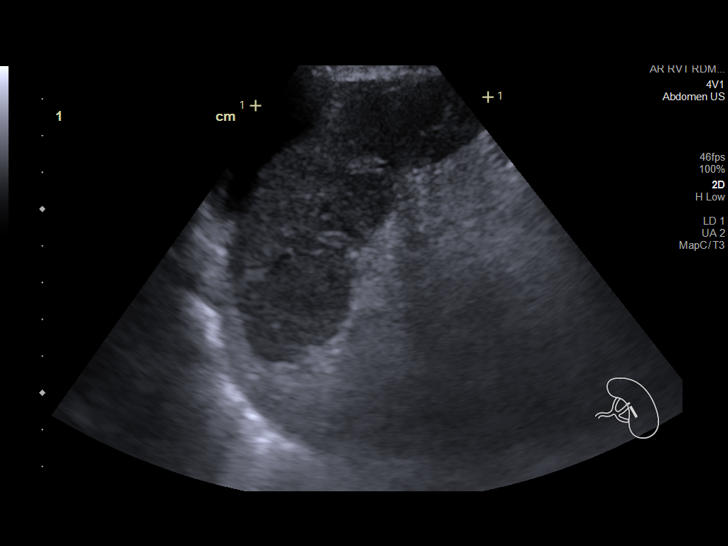
[im 61/91]
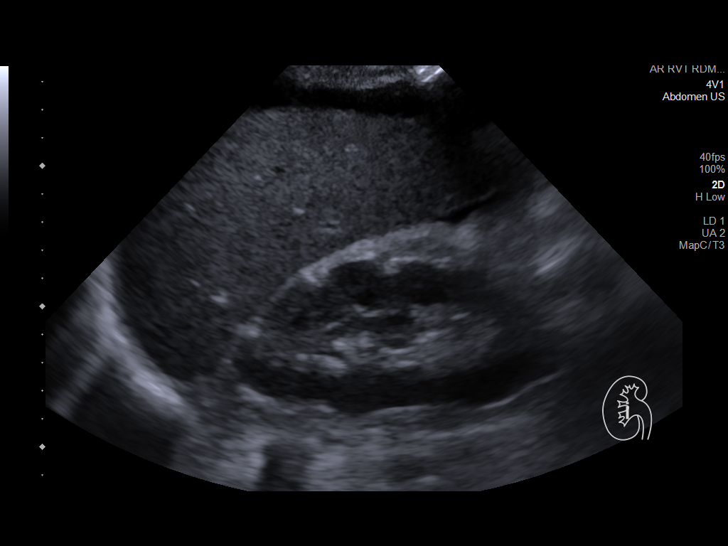
[im 68/91]
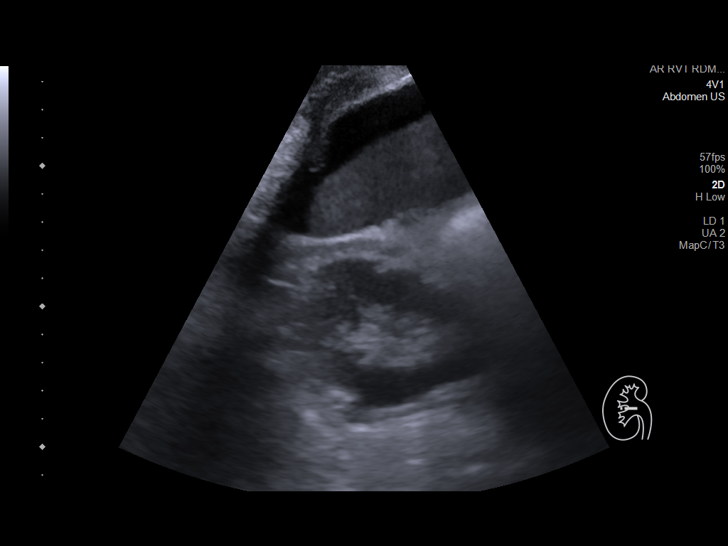
[im 76/91]
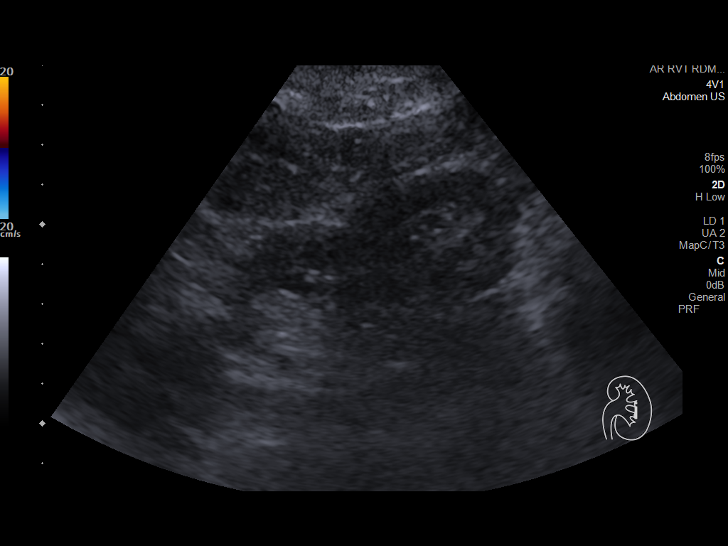
[im 83/91]
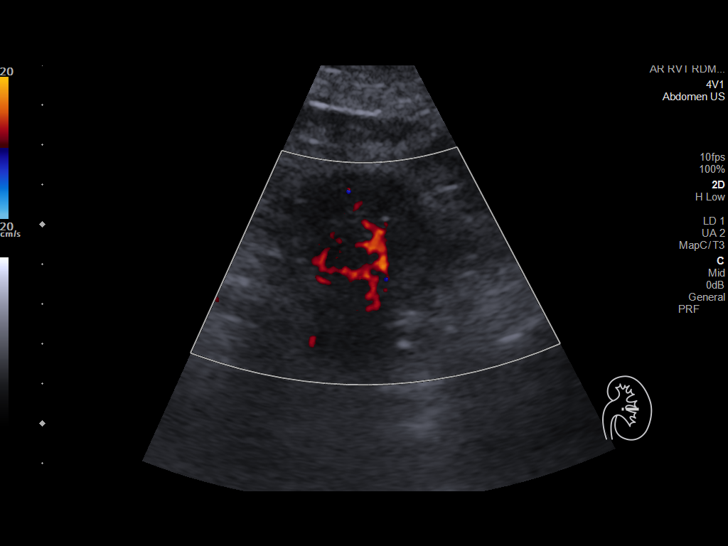
[im 91/91]
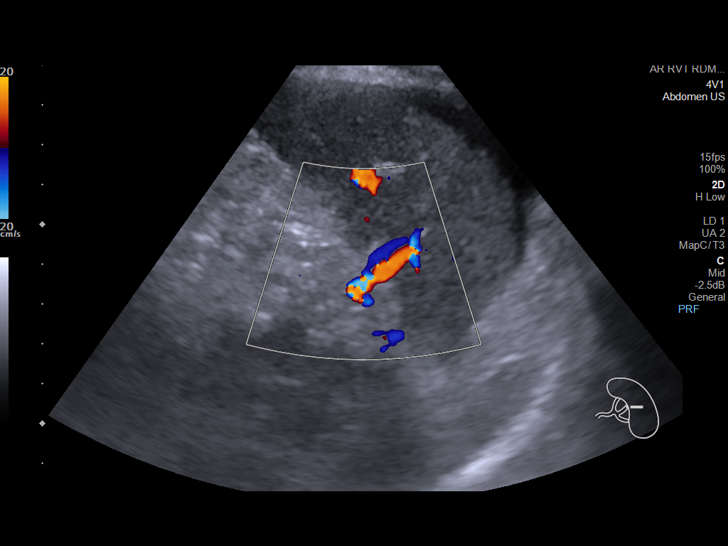

[13 of 25 positions shown; findings below may reference images not displayed]

FINDINGS: Gallbladder: Layering sludge is seen within the gallbladder. The
gallbladder, however, is not distended, there is no gallbladder wall
thickening, and no pericholecystic fluid is identified. The
sonographic Murphy sign is reportedly negative.

Common bile duct: Diameter: 6 mm in proximal diameter

Liver: Hepatic parenchymal echogenicity and echotexture is normal.
Hepatic size is within normal limits. The liver contour demonstrates
subtle nodularity in keeping with changes of cirrhosis. No focal
intrahepatic masses are seen. There is no intrahepatic biliary
ductal dilation. Portal vein is patent on color Doppler imaging with
normal direction of blood flow towards the liver.

IVC: No abnormality visualized.

Pancreas: Obscured by overlying bowel gas

Spleen: Size and appearance within normal limits.

Right Kidney: Length: 12.8 cm. Echogenicity within normal limits. No
mass or hydronephrosis visualized.

Left Kidney: Length: 8.7 cm. Parenchymal scarring is noted. Mild
relative parenchymal atrophy noted. Parenchymal echogenicity is
normal. No hydronephrosis. No intrarenal masses or calcifications
are seen.

Abdominal aorta: No aneurysm visualized.

Other findings: Mild perihepatic and perisplenic ascites is present
IMPRESSION: Parenchymal changes in keeping with cirrhosis. No focal intrahepatic
mass identified.

Cholelithiasis without sonographic evidence of acute cholecystitis.

Mild ascites.

Asymmetric left renal cortical atrophy and scarring. No
hydronephrosis.

## 2021-06-05 LAB — CULTURE, BODY FLUID W GRAM STAIN -BOTTLE: Culture: NO GROWTH

## 2021-06-15 ENCOUNTER — Other Ambulatory Visit: Payer: Self-pay

## 2021-06-15 ENCOUNTER — Encounter: Payer: Self-pay | Admitting: Podiatry

## 2021-06-15 ENCOUNTER — Ambulatory Visit (INDEPENDENT_AMBULATORY_CARE_PROVIDER_SITE_OTHER): Payer: Medicaid Other | Admitting: Podiatry

## 2021-06-15 DIAGNOSIS — K7031 Alcoholic cirrhosis of liver with ascites: Secondary | ICD-10-CM

## 2021-06-15 DIAGNOSIS — K7030 Alcoholic cirrhosis of liver without ascites: Secondary | ICD-10-CM

## 2021-06-15 DIAGNOSIS — M21622 Bunionette of left foot: Secondary | ICD-10-CM

## 2021-06-15 DIAGNOSIS — M2041 Other hammer toe(s) (acquired), right foot: Secondary | ICD-10-CM | POA: Diagnosis not present

## 2021-06-15 DIAGNOSIS — M21621 Bunionette of right foot: Secondary | ICD-10-CM

## 2021-06-15 LAB — BASIC METABOLIC PANEL
BUN: 15 mg/dL (ref 7–25)
CO2: 24 mmol/L (ref 20–32)
Calcium: 8.3 mg/dL — ABNORMAL LOW (ref 8.6–10.3)
Chloride: 109 mmol/L (ref 98–110)
Creat: 1.22 mg/dL (ref 0.70–1.30)
Glucose, Bld: 86 mg/dL (ref 65–99)
Potassium: 4.4 mmol/L (ref 3.5–5.3)
Sodium: 139 mmol/L (ref 135–146)

## 2021-06-15 NOTE — Progress Notes (Signed)
Subjective:   Patient ID: [name] [name], male   DOB: 56 y.o.   MRN: [id number]   HPI Patient presents in poor health with severe bunion formation digital deformities lesion formation and nail disease   ROS      Objective:  Physical Exam  Neurovascular status unchanged with patient found to have thick brittle nailbeds that are painful lesions and structural damage     Assessment:  Patient in very poor health with chronic nail disease lesion formation and structural deformity     Plan:  H&P all conditions reviewed debridement accomplished no iatrogenic bleeding discussed structural deformity do not recommend correction at this time due to poor health and recommended that he continue with conservative care.  Reappoint to recheck

## 2021-06-16 ENCOUNTER — Telehealth: Payer: Self-pay

## 2021-06-16 ENCOUNTER — Encounter: Payer: Self-pay | Admitting: Gastroenterology

## 2021-06-16 ENCOUNTER — Ambulatory Visit (INDEPENDENT_AMBULATORY_CARE_PROVIDER_SITE_OTHER): Payer: Medicaid Other | Admitting: Gastroenterology

## 2021-06-16 VITALS — BP 118/78 | HR 100 | Temp 97.7°F | Ht 72.0 in | Wt 172.6 lb

## 2021-06-16 DIAGNOSIS — K7031 Alcoholic cirrhosis of liver with ascites: Secondary | ICD-10-CM | POA: Diagnosis not present

## 2021-06-16 MED ORDER — FUROSEMIDE 20 MG PO TABS
40.0000 mg | ORAL_TABLET | Freq: Every day | ORAL | 5 refills | Status: DC
Start: 2021-06-16 — End: 2021-07-13

## 2021-06-16 NOTE — Progress Notes (Signed)
Referring Provider: Carrolyn Meiers* Primary Care Physician:  Carrolyn Meiers, MD Primary GI: Dr. Gala Romney    Chief Complaint  Patient presents with   Cirrhosis    Abd swelling    HPI:   [name] [name] is a 56 y.o. male presenting today with a history of alcoholic liver disease, Colonoscopy on file from 2018 with tubular adenomas and surveillance due in 2023.  Followed by Hematology for elevated ferritin. Recent EGD with normal esophagus, small hiatal hernia, abnormal gastric mucosa, abnormal appearing ampula and periampullary mucosa. Mild chronic gastritis. MRI/MRCP with subtle prominence of ampulla without well-defined mass. Pancreas divisum. US abdomen on file from Feb 2023 with mild ascites and cirrhosis.  Needs follow-up MRCP to reassess ampulla.   He recently required initial para in Jan 2023, with 6 liters removed. Culture with lymphocytosis. PMNs 38. Portal vein patent on [place] 06/03/21. Lasix 20 mg, aldactone 50 BID. No added salt. No confusion. No overt GI bleeding. Abdomen distended again. Mild lower extremity edema. No overt GI bleeding. No jaundice or pruritis.   Past Medical History:  Diagnosis Date   Asthma    Dyspnea    High cholesterol    History of kidney stones    Hypertension    Kidney stones     Past Surgical History:  Procedure Laterality Date   APPENDECTOMY     BIOPSY  07/22/2020   Procedure: BIOPSY;  Surgeon: Daneil Dolin, MD;  [facility]: [facility];  Service: Endoscopy;;   COLONOSCOPY WITH PROPOFOL N/A 04/18/2017   non-bleeding internal hemorrhoids, two 4-6 mm polyps in descending colon and cecum, pancolonic diverticulosis, single cecal AVM. Tubular adenomas, surveillance in 2023.    ESOPHAGOGASTRODUODENOSCOPY (EGD) WITH PROPOFOL N/A 07/22/2020   normal esophagus, small hiatal hernia, abnormal gastric mucosa, abnormal appearing ampula and periampullary mucosa. Mild chronic gastritis.   FRACTURE SURGERY     left arm   IR PARACENTESIS   05/31/2021   LOWER EXTREMITY VENOGRAPHY N/A 08/10/2020   Procedure: LOWER EXTREMITY VENOGRAPHY;  Surgeon: Waynetta Sandy, MD;  Location: [place] [facility];  Service: Cardiovascular;  Laterality: N/A;   PERIPHERAL VASCULAR BALLOON ANGIOPLASTY Left 08/10/2020   Procedure: PERIPHERAL VASCULAR BALLOON ANGIOPLASTY;  Surgeon: Waynetta Sandy, MD;  Location: [place] [facility];  Service: Cardiovascular;  Laterality: Left;  lower extremity venous   PERIPHERAL VASCULAR THROMBECTOMY N/A 08/10/2020   Procedure: PERIPHERAL VASCULAR THROMBECTOMY;  Surgeon: Waynetta Sandy, MD;  Location: [place] [facility];  Service: Cardiovascular;  Laterality: N/A;   POLYPECTOMY  04/18/2017   Procedure: POLYPECTOMY;  Surgeon: Daneil Dolin, MD;  [facility]: [facility];  Service: Endoscopy;;    Current Outpatient Medications  Medication Sig Dispense Refill   acetaminophen (TYLENOL) 500 MG tablet Take 1,000 mg by mouth every 6 (six) hours as needed.     albuterol (PROVENTIL HFA) 108 (90 Base) MCG/ACT inhaler INHALE 2 PUFFS BY MOUTH EVERY 6 HOURS AS NEEDED FOR COUGHING, WHEEZING, OR SHORTNESS OF BREATH 18 g 1   amLODipine (NORVASC) 10 MG tablet Take 10 mg by mouth daily.     aspirin 81 MG chewable tablet Chew 81 mg by mouth daily.     atorvastatin (LIPITOR) 20 MG tablet Take 20 mg by mouth daily.     metoprolol tartrate (LOPRESSOR) 25 MG tablet Take 25 mg by mouth 2 (two) times daily.     mometasone-formoterol (DULERA) 200-5 MCG/ACT AERO Inhale 2 puffs into the lungs 2 (two)  times daily. 13 g 3   nicotine (NICODERM CQ - DOSED IN MG/24 HOURS) 14 mg/24hr patch Place 1 patch (14 mg total) onto the skin daily. (Patient taking differently: Place 14 mg onto the skin as needed.) 14 patch 0   pantoprazole (PROTONIX) 40 MG tablet Take 1 tablet (40 mg total) by mouth daily. 30 tablet 1   spironolactone (ALDACTONE) 50 MG tablet Take 50 mg by mouth 2 (two) times daily.     furosemide (LASIX) 20 MG tablet  Take 2 tablets (40 mg total) by mouth daily. Take with spironolactone each morning 60 tablet 5   No current facility-administered medications for this visit.    Allergies as of 06/16/2021   (No Known Allergies)    Family History  Problem Relation Age of Onset   Cancer Father        throat   Diabetes Sister    Colon cancer Neg Hx     Social History   Socioeconomic History   Marital status: Single    Spouse name: Not on file   Number of children: Not on file   Years of education: Not on file   Highest education level: Not on file  Occupational History   Not on file  Tobacco Use   Smoking status: Every Day    Packs/day: 0.50    Years: 33.00    Pack years: 16.50    Types: Cigarettes   Smokeless tobacco: Never  Vaping Use   Vaping Use: Never used  Substance and Sexual Activity   Alcohol use: No    Comment: history of ETOH use 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 73s, no ETOH for about 4 weeks as of 07/07/20   Drug use: No   Sexual activity: Yes    Birth control/protection: None  Other Topics Concern   Not on file  Social History Narrative   Not on file   Social Determinants of Health   Financial Resource Strain: High Risk   Difficulty of Paying Living Expenses: Very hard  Food Insecurity: No Food Insecurity   Worried About Charity fundraiser in the Last Year: Never true   Ran Out of Food in the Last Year: Never true  Transportation Needs: No Transportation Needs   Lack of Transportation (Medical): No   Lack of Transportation (Non-Medical): No  Physical Activity: Sufficiently Active   Days of Exercise per Week: 7 days   Minutes of Exercise per Session: 30 min  Stress: No Stress Concern Present   Feeling of Stress : Only a little  Social Connections: Moderately Isolated   Frequency of Communication with Friends and Family: More than three times a week   Frequency of Social Gatherings with Friends and Family: Once a week   Attends Religious Services: More than 4  times per year   Active Member of [employer] or Organizations: No   Attends Archivist Meetings: Never   Marital Status: Never married    Review of Systems: Gen: Denies fever, chills, anorexia. Denies fatigue, weakness, weight loss.  CV: Denies chest pain, palpitations, syncope, peripheral edema, and claudication. Resp: Denies dyspnea at rest, cough, wheezing, coughing up blood, and pleurisy. GI: see HPI Derm: Denies rash, itching, dry skin Psych: Denies depression, anxiety, memory loss, confusion. No homicidal or suicidal ideation.  Heme: Denies bruising, bleeding, and enlarged lymph nodes.  Physical Exam: BP 118/78    Pulse 100    Temp 97.7 F (36.5 C) (Temporal)    Ht 6' (1.829  m)    Wt 172 lb 9.6 oz (78.3 kg)    BMI 23.41 kg/m  General:   Alert and oriented. No distress noted. Pleasant and cooperative.  Head:  Normocephalic and atraumatic. Eyes:  Conjuctiva clear without scleral icterus. Mouth:  mask in place Abdomen:  +BS, distended with moderately tense ascites, umbilical hernia. No rebound or guarding. No HSM or masses noted. Msk:  Symmetrical without gross deformities. Normal posture. Extremities:  With trace lower extremity edema Neurologic:  Alert and  oriented x4 Psych:  Alert and cooperative. Normal mood and affect.  ASSESSMENT: [name] [name] is a 56 y.o. male presenting today with a history of alcoholic liver disease, colonoscopy on file from 2018 with tubular adenomas and surveillance due in 2023.  Followed by Hematology for elevated ferritin. Recent EGD with normal esophagus, small hiatal hernia, abnormal gastric mucosa, abnormal appearing ampula and periampullary mucosa. Mild chronic gastritis. MRI/MRCP with subtle prominence of ampulla without well-defined mass. Pancreas divisum. US abdomen on file from Feb 2023 with mild ascites and cirrhosis.  Needs follow-up MRCP to reassess ampulla.   Cirrhosis: due to history of ETOH. Now with first ever para in Jan 2023.  Notably, no portal vein thrombosis on [place] Feb 2023. His diuretics were adjusted several months ago in setting of hypokalemia, which likely contributed to onset of ascites. He denies any added salt. We will pursue MRI in future due to history of abnormal ampulla and reassess. I have asked that he increase Lasix to 40 mg daily and continue aldactone at 100 mg daily. Will need para in near future with cell count, cytology, culture. Will need close following of BMP.   History of adenomas: surveillance due Dec 2023.    PLAN:  Lasix increased from 20 mg to 40 mg daily, continue aldactone 100 mg daily Repeat BMP in 1 week MRI/MRCP due to history of abnormal ampulla [place] para with fluid analysis CBC, CMP, INR, AFP next week  Refer to Transplant in future to establish care; patient continues to avoid ETOH since March 2022.  Return in 4 weeks for close follow-up  Annitta Needs, PhD, [facility]

## 2021-06-16 NOTE — Patient Instructions (Signed)
Please increase furosemide (Lasix) to a total of 40 milligrams each morning (Take two of the 20 milligram tablets together).  Continue taking spironolactone 2 tablets daily, but take in morning at one time with the Lasix.   I have ordered more fluid removal from your abdomen. On that day, don't take your fluid pills. You can start taking them again the next day.   I have ordered a special MRI of liver.  Please have blood work done next Wednesday.  I will see you in 4 weeks!  I enjoyed seeing you again today! As you know, I value our relationship and want to provide genuine, compassionate, and quality care. I welcome your feedback. If you receive a survey regarding your visit,  I greatly appreciate you taking time to fill this out. See you next time!  Annitta Needs, PhD, ANP-BC [facility] Gastroenterology

## 2021-06-16 NOTE — Telephone Encounter (Signed)
PA for MRI abdomen w/wo contrast submitted via AIM website. Order ID: [id number], valid 06/16/21-08/14/21.

## 2021-06-20 ENCOUNTER — Ambulatory Visit (HOSPITAL_COMMUNITY)
Admission: RE | Admit: 2021-06-20 | Discharge: 2021-06-20 | Disposition: A | Discharge status: Home / Self Care | Payer: Medicaid Other | Source: Ambulatory Visit | Attending: Gastroenterology | Admitting: Gastroenterology

## 2021-06-20 ENCOUNTER — Encounter (HOSPITAL_COMMUNITY): Payer: Self-pay

## 2021-06-20 ENCOUNTER — Other Ambulatory Visit: Payer: Self-pay

## 2021-06-20 DIAGNOSIS — K7031 Alcoholic cirrhosis of liver with ascites: Secondary | ICD-10-CM | POA: Diagnosis not present

## 2021-06-20 LAB — BODY FLUID CELL COUNT WITH DIFFERENTIAL
Lymphs, Fluid: 65 %
Monocyte-Macrophage-Serous Fluid: 29 % — ABNORMAL LOW (ref 50–90)
Neutrophil Count, Fluid: 6 % (ref 0–25)
Total Nucleated Cell Count, Fluid: 255 cu mm (ref 0–1000)

## 2021-06-20 LAB — GRAM STAIN

## 2021-06-20 IMAGING — US US PARACENTESIS
1 series · 2 of 2 positions shown · non-contrast
Comparison: none

INDICATION: Alcoholic cirrhosis, ascites

[Series 1: us paracentesis · 0.25mm/px · 2 of 2 slices shown]
[im 1/2]
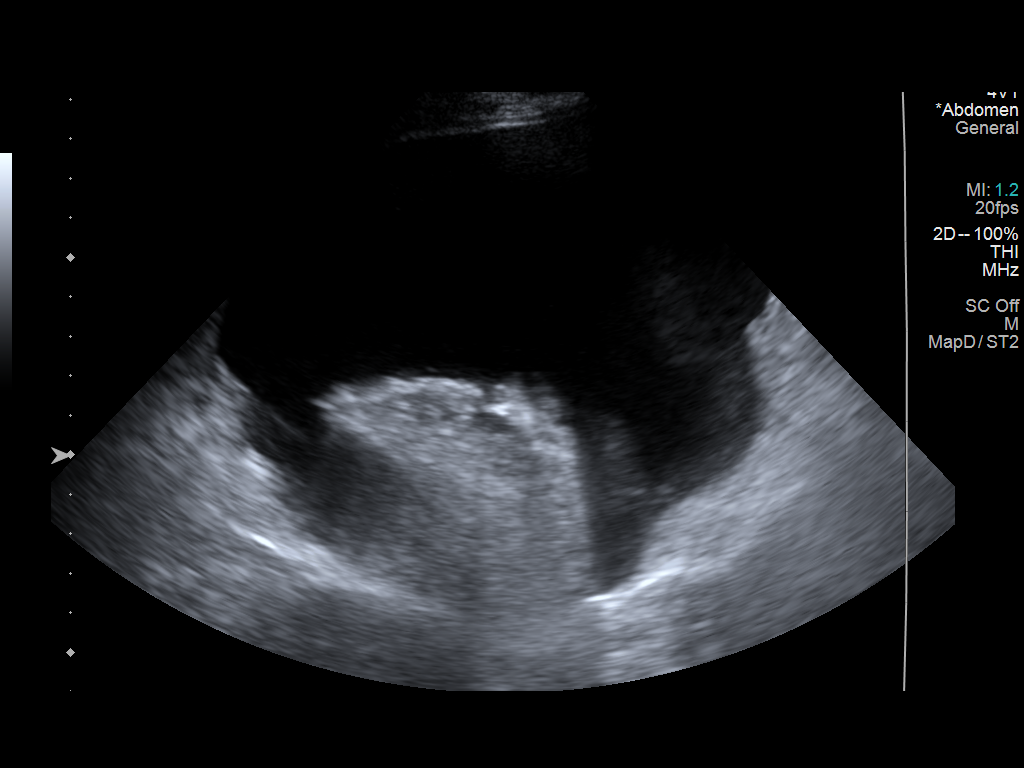
[im 2/2]
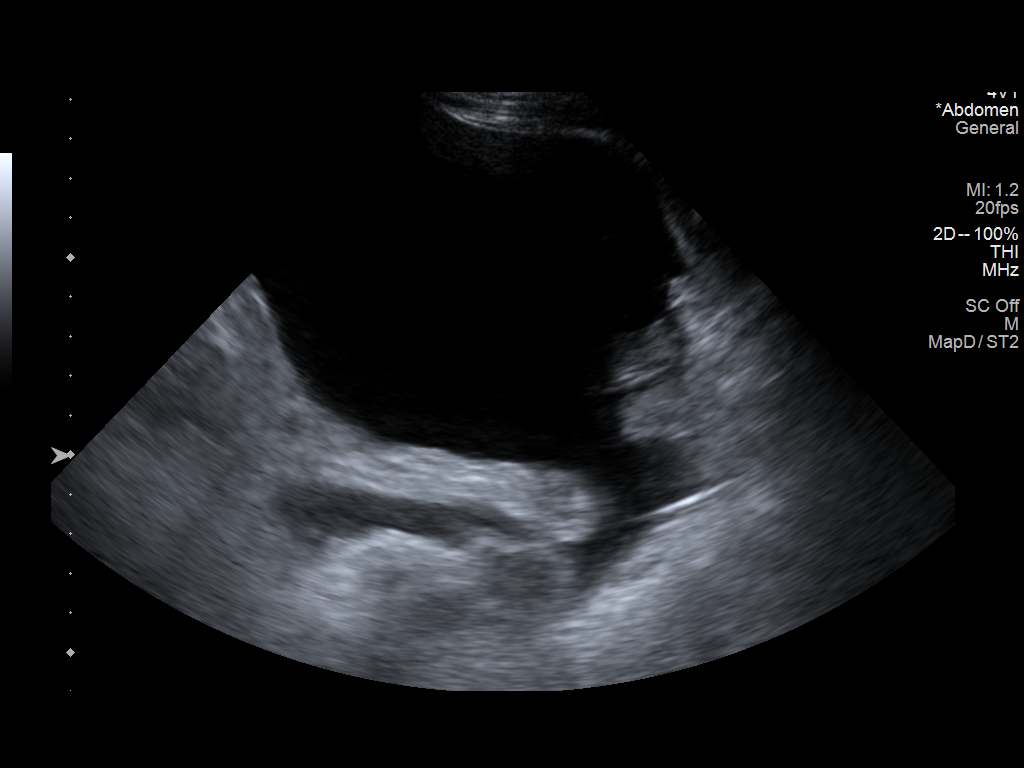

[2 of 2 positions shown; findings below may reference images not displayed]

EXAM:
ULTRASOUND GUIDED  PARACENTESIS

MEDICATIONS:
None.

COMPLICATIONS:
None immediate.

PROCEDURE:
Informed written consent was obtained from the patient after a
discussion of the risks, benefits and alternatives to treatment. A
timeout was performed prior to the initiation of the procedure.

Initial ultrasound scanning demonstrates a large amount of ascites
within the right lower abdominal quadrant. The right lower abdomen
was prepped and draped in the usual sterile fashion. 1% lidocaine
was used for local anesthesia.

Following this, a 5 French Yueh catheter was introduced. An
ultrasound image was saved for documentation purposes. The
paracentesis was performed. The catheter was removed and a dressing
was applied. The patient tolerated the procedure well without
immediate post procedural complication.
Patient received post-procedure intravenous albumin; see nursing
notes for details.
FINDINGS: A total of approximately 6 L of clear yellow ascitic fluid was
removed, requested maximum. Samples were sent to the laboratory as
requested by the clinical team.
IMPRESSION: Successful ultrasound-guided paracentesis yielding 6 liters of
peritoneal fluid.

## 2021-06-20 NOTE — Procedures (Signed)
PreOperative Dx: Alcoholic cirrhosis, ascites Postoperative Dx: Alcoholic cirrhosis, ascites Procedure:   US guided paracentesis Radiologist:  Arelly Whittenberg Anesthesia:  10 ml of 1% lidocaine Specimen:  6 L of yellow ascitic fluid EBL:   < 1 ml Complications:  None  

## 2021-06-20 NOTE — Progress Notes (Signed)
PT tolerated right sided paracentesis procedure well today and 6 Liters of clear yellow fluid removed with labs collected and sent for processing. PT verbalized understanding of discharge instructions and  ambulatory at discharge with no acute distress noted.

## 2021-06-21 LAB — ACID FAST SMEAR (AFB, MYCOBACTERIA): Acid Fast Smear: NEGATIVE

## 2021-06-21 LAB — CYTOLOGY - NON PAP

## 2021-06-23 LAB — COMPLETE METABOLIC PANEL WITH GFR
AG Ratio: 0.5 (calc) — ABNORMAL LOW (ref 1.0–2.5)
ALT: 11 U/L (ref 9–46)
AST: 21 U/L (ref 10–35)
Albumin: 2.1 g/dL — ABNORMAL LOW (ref 3.6–5.1)
Alkaline phosphatase (APISO): 107 U/L (ref 35–144)
BUN/Creatinine Ratio: 10 (calc) (ref 6–22)
BUN: 15 mg/dL (ref 7–25)
CO2: 21 mmol/L (ref 20–32)
Calcium: 8.3 mg/dL — ABNORMAL LOW (ref 8.6–10.3)
Chloride: 111 mmol/L — ABNORMAL HIGH (ref 98–110)
Creat: 1.44 mg/dL — ABNORMAL HIGH (ref 0.70–1.30)
Globulin: 4.3 g/dL (calc) — ABNORMAL HIGH (ref 1.9–3.7)
Glucose, Bld: 102 mg/dL — ABNORMAL HIGH (ref 65–99)
Potassium: 3.7 mmol/L (ref 3.5–5.3)
Sodium: 141 mmol/L (ref 135–146)
Total Bilirubin: 0.3 mg/dL (ref 0.2–1.2)
Total Protein: 6.4 g/dL (ref 6.1–8.1)
eGFR: 57 mL/min/{1.73_m2} — ABNORMAL LOW (ref >=60)

## 2021-06-23 LAB — CBC WITH DIFFERENTIAL/PLATELET
Absolute Monocytes: 1062 cells/uL — ABNORMAL HIGH (ref 200–950)
Basophils Absolute: 106 cells/uL (ref 0–200)
Basophils Relative: 0.9 %
Eosinophils Absolute: 295 cells/uL (ref 15–500)
Eosinophils Relative: 2.5 %
HCT: 29.2 % — ABNORMAL LOW (ref 38.5–50.0)
Hemoglobin: 9.5 g/dL — ABNORMAL LOW (ref 13.2–17.1)
Lymphs Abs: 2159 cells/uL (ref 850–3900)
MCH: 30.5 pg (ref 27.0–33.0)
MCHC: 32.5 g/dL (ref 32.0–36.0)
MCV: 93.9 fL (ref 80.0–100.0)
MPV: 9.7 fL (ref 7.5–12.5)
Monocytes Relative: 9 %
Neutro Abs: 8177 cells/uL — ABNORMAL HIGH (ref 1500–7800)
Neutrophils Relative %: 69.3 %
Platelets: 417 10*3/uL — ABNORMAL HIGH (ref 140–400)
RBC: 3.11 10*6/uL — ABNORMAL LOW (ref 4.20–5.80)
RDW: 12.8 % (ref 11.0–15.0)
Total Lymphocyte: 18.3 %
WBC: 11.8 10*3/uL — ABNORMAL HIGH (ref 3.8–10.8)

## 2021-06-23 LAB — AFP TUMOR MARKER: AFP-Tumor Marker: 2.8 ng/mL (ref <6.1)

## 2021-06-23 LAB — PROTIME-INR
INR: 1.1
Prothrombin Time: 10.9 s (ref 9.0–11.5)

## 2021-06-25 LAB — CULTURE, BODY FLUID W GRAM STAIN -BOTTLE
Culture: NO GROWTH
Special Requests: ADEQUATE

## 2021-06-29 ENCOUNTER — Other Ambulatory Visit: Payer: Self-pay

## 2021-06-29 ENCOUNTER — Ambulatory Visit (HOSPITAL_COMMUNITY)
Admission: RE | Admit: 2021-06-29 | Discharge: 2021-06-29 | Disposition: A | Discharge status: Home / Self Care | Payer: Medicaid Other | Source: Ambulatory Visit | Attending: Gastroenterology | Admitting: Gastroenterology

## 2021-06-29 DIAGNOSIS — K7031 Alcoholic cirrhosis of liver with ascites: Secondary | ICD-10-CM | POA: Insufficient documentation

## 2021-06-29 IMAGING — MR MR ABDOMEN WO/W CM
20 series · 48 of 48 positions shown · IV contrast (8 ml Gadavist)
Comparison: MRI abdomen [DATE]

CLINICAL DATA: Ascites, abnormal ampulla on endoscopy

EXAM:
MRI ABDOMEN WITHOUT AND WITH CONTRAST
TECHNIQUE: Multiplanar multisequence MR imaging of the abdomen was performed
both before and after the administration of intravenous contrast.
CONTRAST:  8mL GADAVIST GADOBUTROL 1 MMOL/ML IV SOLN

[Series 3: cor haste · coronal · 6.0mm · 1.19mm/px · 2 of 38 slices shown]
[im 1/38]
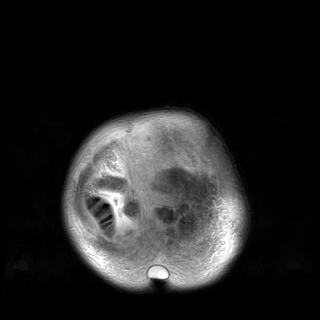
[im 38/38]
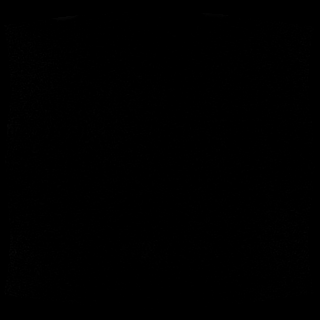

[Series 4: ax haste · axial · 6.0mm · 1.19mm/px · z∈[-162,+176]mm · 3 of 48 slices shown]
[im 1/48]
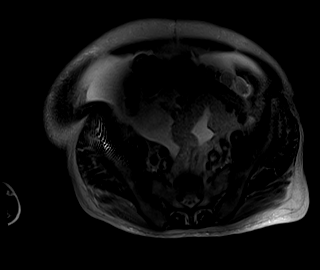
[im 24/48]
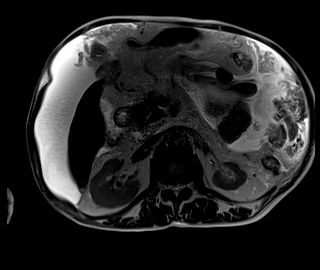
[im 48/48]
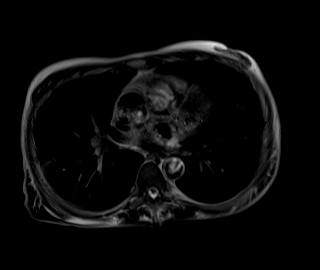

[Series 6: T2 fat-sat · axial · 6.0mm · 1.19mm/px · z∈[-64,+174]mm · 2 of 34 slices shown]
[im 1/34]
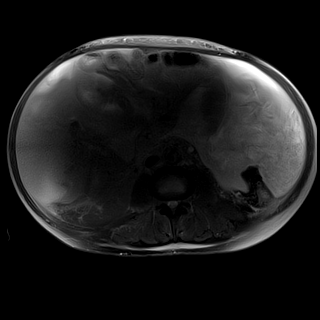
[im 34/34]
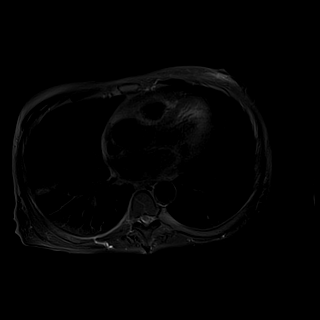

[Series 7: DWI · axial · 6.0mm · 1.42mm/px · 1 of 39 slices shown (1 of 4)]
[im 1/39]
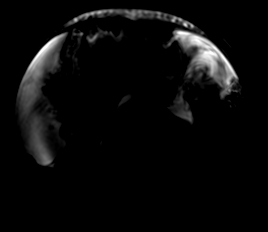

[Series 7: DWI · axial · 6.0mm · 1.42mm/px · 1 of 39 slices shown (2 of 4)]
[im 1/39]
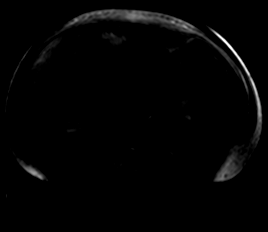

[Series 7: DWI · axial · 6.0mm · 1.42mm/px · 1 of 39 slices shown (3 of 4)]
[im 1/39]
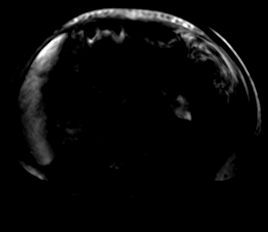

[Series 8: DWI · axial · 6.0mm · 1.42mm/px · 1 of 39 slices shown (4 of 4)]
[im 1/39]
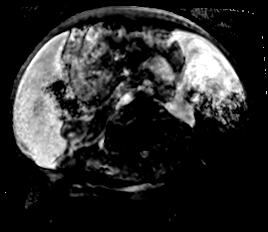

[Series 9: bSSFP · axial · 6.0mm · 0.74mm/px · 1 of 38 slices shown]
[im 1/38]
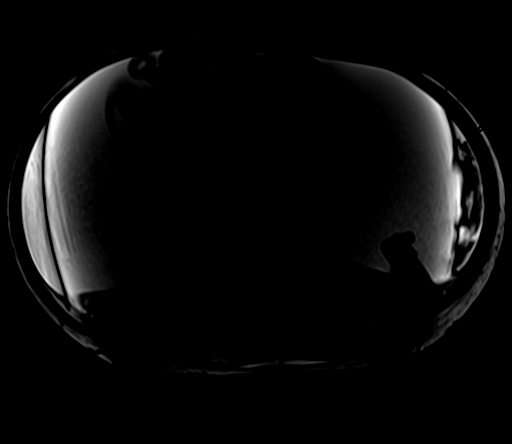

[Series 10: ax in and · axial · 3.0mm · 1.19mm/px · z∈[-72,+165]mm · 3 of 80 slices shown (1 of 2)]
[im 1/80]
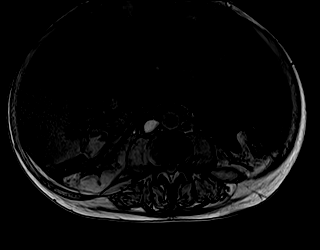
[im 40/80]
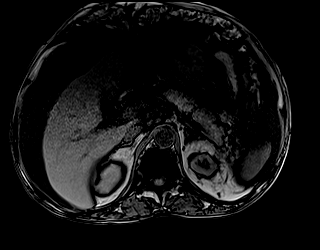
[im 80/80]
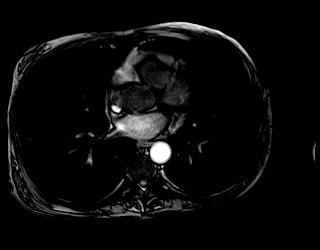

[Series 11: ax in and · axial · 3.0mm · 1.19mm/px · z∈[-72,+165]mm · 3 of 80 slices shown (2 of 2)]
[im 1/80]
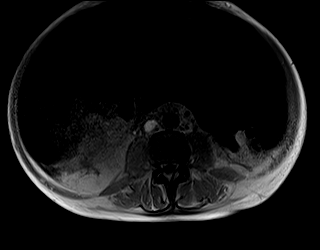
[im 40/80]
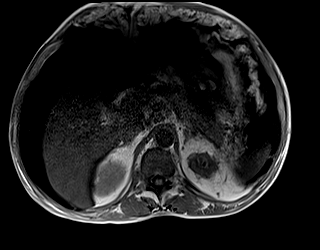
[im 80/80]
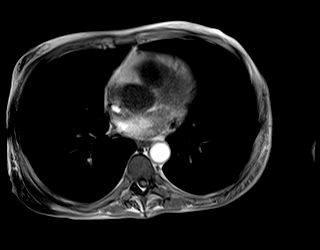

[Series 12: T1 dynamic · axial · non-contrast · 3.0mm · 1.19mm/px · z∈[-72,+165]mm · 3 of 80 slices shown (1 of 4)]
[im 1/80]
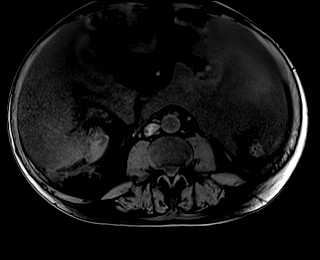
[im 40/80]
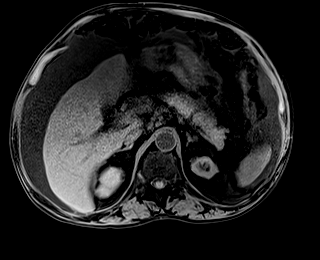
[im 80/80]
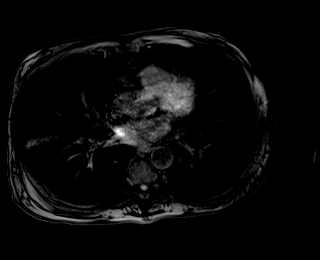

[Series 14: T1 dynamic post-contrast · axial · 3.0mm · 1.19mm/px · z∈[-72,+165]mm · 3 of 80 slices shown (1 of 6)]
[im 1/80]
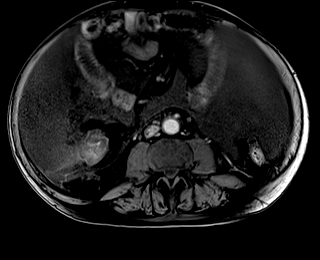
[im 40/80]
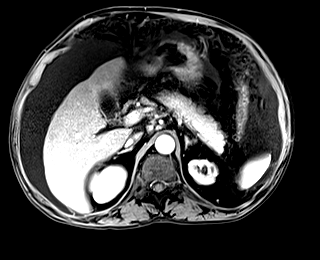
[im 80/80]
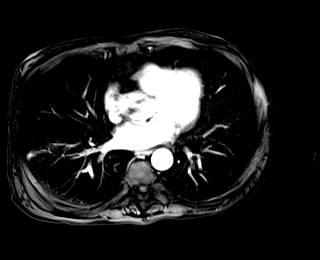

[Series 15: T1 dynamic · axial · 3.0mm · 1.19mm/px · z∈[-72,+165]mm · 3 of 80 slices shown (2 of 4)]
[im 1/80]
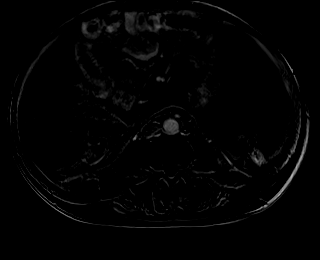
[im 40/80]
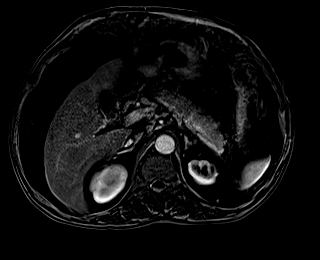
[im 80/80]
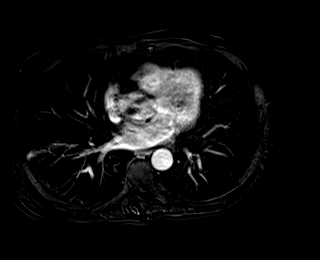

[Series 16: T1 dynamic post-contrast · axial · 3.0mm · 1.19mm/px · z∈[-72,+165]mm · 3 of 80 slices shown (2 of 6)]
[im 1/80]
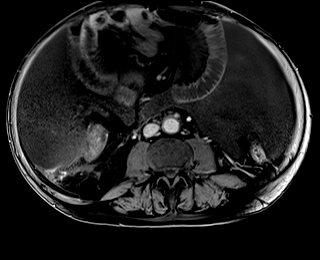
[im 40/80]
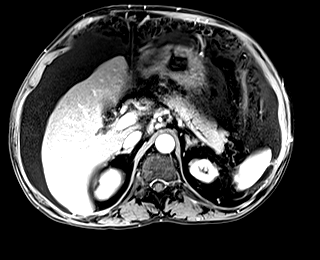
[im 80/80]
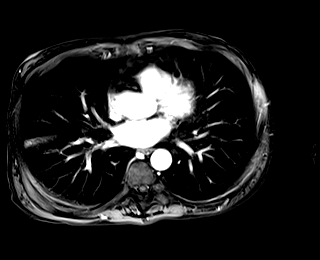

[Series 17: T1 dynamic · axial · 3.0mm · 1.19mm/px · z∈[-72,+165]mm · 3 of 80 slices shown (3 of 4)]
[im 1/80]
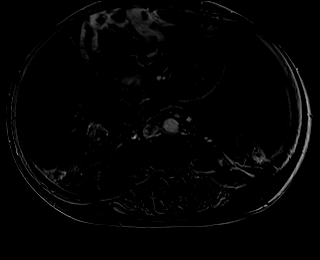
[im 40/80]
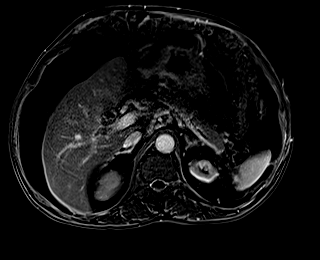
[im 80/80]
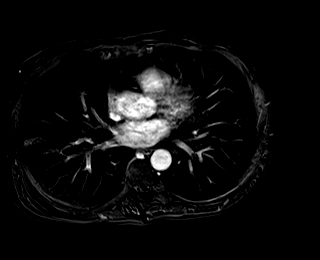

[Series 18: T1 dynamic post-contrast · axial · 3.0mm · 1.19mm/px · z∈[-72,+165]mm · 3 of 80 slices shown (3 of 6)]
[im 1/80]
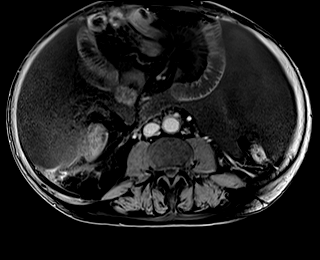
[im 40/80]
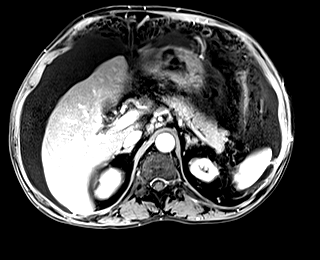
[im 80/80]
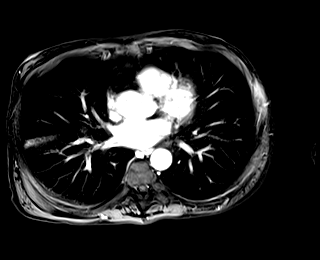

[Series 19: T1 dynamic · axial · 3.0mm · 1.19mm/px · z∈[-72,+165]mm · 3 of 80 slices shown (4 of 4)]
[im 1/80]
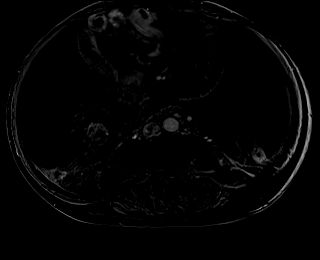
[im 40/80]
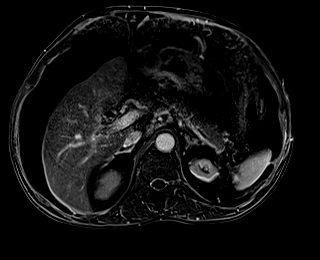
[im 80/80]
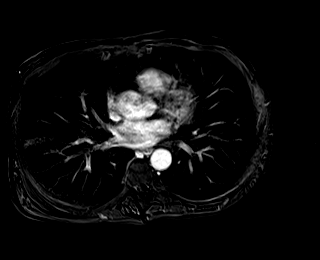

[Series 20: T1 dynamic post-contrast · coronal · 3.0mm · 1.19mm/px · 3 of 80 slices shown (4 of 6)]
[im 1/80]
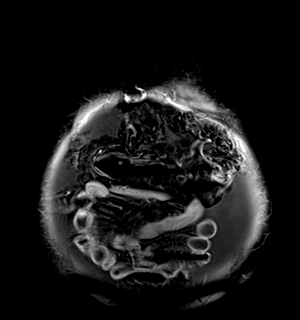
[im 40/80]
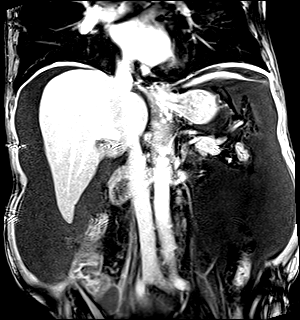
[im 80/80]
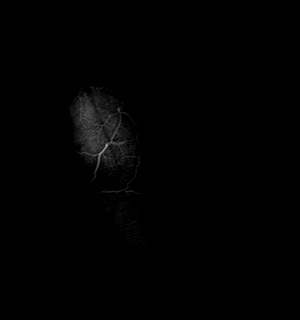

[Series 21: T1 dynamic post-contrast · axial · 3.0mm · 1.19mm/px · z∈[-72,+165]mm · 3 of 80 slices shown (5 of 6)]
[im 1/80]
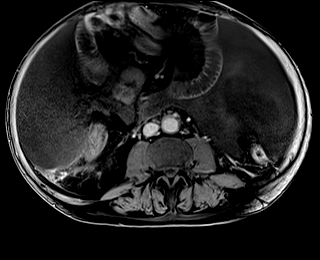
[im 40/80]
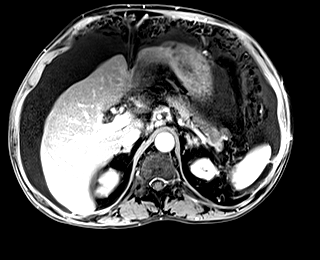
[im 80/80]
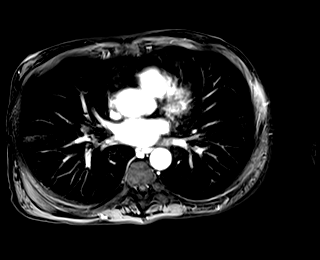

[Series 22: T1 dynamic post-contrast · axial · 3.0mm · 1.19mm/px · z∈[-72,+165]mm · 3 of 80 slices shown (6 of 6)]
[im 1/80]
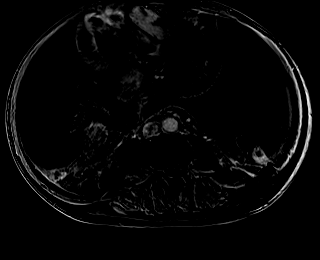
[im 40/80]
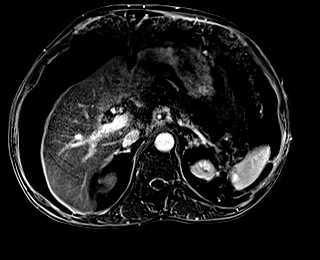
[im 80/80]
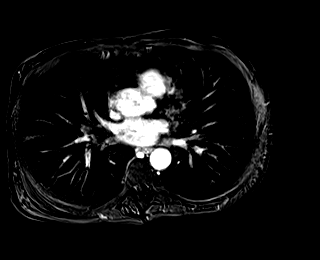

[48 of 48 positions shown; findings below may reference images not displayed]

FINDINGS: Lower chest: No acute findings.

Hepatobiliary: Liver is upper normal size with subtle nodularity of
the contour. The parenchyma is diffusely hypointense T2 with
relatively increased signal on out of phase T1, suggesting
hemosiderosis. No suspicious hepatic mass visualized. Small amount
of tiny calculi/sludge visualized in the gallbladder. No significant
gallbladder wall thickening visualized. No biliary ductal
dilatation. No definite evidence of an ampullary mass.

Pancreas: No suspicious mass or ductal dilatation identified.
Pancreatic divisum again noted.

Spleen:  Within normal limits in size and appearance.

Adrenals/Urinary Tract: Adrenal glands appear normal. Left kidney is
atrophic and lobulated with a few tiny cysts. No hydronephrosis
identified bilaterally. No suspicious enhancing renal mass
visualized.

Stomach/Bowel: No evidence of bowel obstruction. Centralization of
bowel loops secondary to ascites.

Vascular/Lymphatic: No pathologically enlarged lymph nodes
identified. No abdominal aortic aneurysm demonstrated.

Other:  Large volume ascites.

Musculoskeletal: No suspicious bone lesions identified.
IMPRESSION: 1. Subtle nodular contour of the liver likely representing early
cirrhotic change. Hepatic parenchymal signal suggestive of
hemosiderosis.
2. No definite evidence of an ampullary mass. No biliary ductal
dilatation.
3. Left renal atrophy.
4. Large volume ascites.

## 2021-06-29 MED ORDER — GADOBUTROL 1 MMOL/ML IV SOLN
8.0000 mL | Freq: Once | INTRAVENOUS | Status: AC | PRN
  Administered 2021-06-29: 8 mL via INTRAVENOUS

## 2021-06-30 ENCOUNTER — Other Ambulatory Visit: Payer: Self-pay | Admitting: *Deleted

## 2021-06-30 DIAGNOSIS — K7031 Alcoholic cirrhosis of liver with ascites: Secondary | ICD-10-CM

## 2021-07-06 ENCOUNTER — Encounter (HOSPITAL_COMMUNITY): Payer: Self-pay

## 2021-07-06 ENCOUNTER — Telehealth: Payer: Self-pay | Admitting: Internal Medicine

## 2021-07-06 ENCOUNTER — Other Ambulatory Visit: Payer: Self-pay

## 2021-07-06 ENCOUNTER — Ambulatory Visit (HOSPITAL_COMMUNITY)
Admission: RE | Admit: 2021-07-06 | Discharge: 2021-07-06 | Disposition: A | Discharge status: Home / Self Care | Payer: Medicaid Other | Source: Ambulatory Visit | Attending: Student | Admitting: Student

## 2021-07-06 DIAGNOSIS — R188 Other ascites: Secondary | ICD-10-CM | POA: Insufficient documentation

## 2021-07-06 IMAGING — US US PARACENTESIS
1 series · 2 of 2 positions shown · non-contrast
Comparison: none

INDICATION: Recurrent ascites

[Series 1: us paracentesis · 2 of 2 slices shown]
[im 1/2]
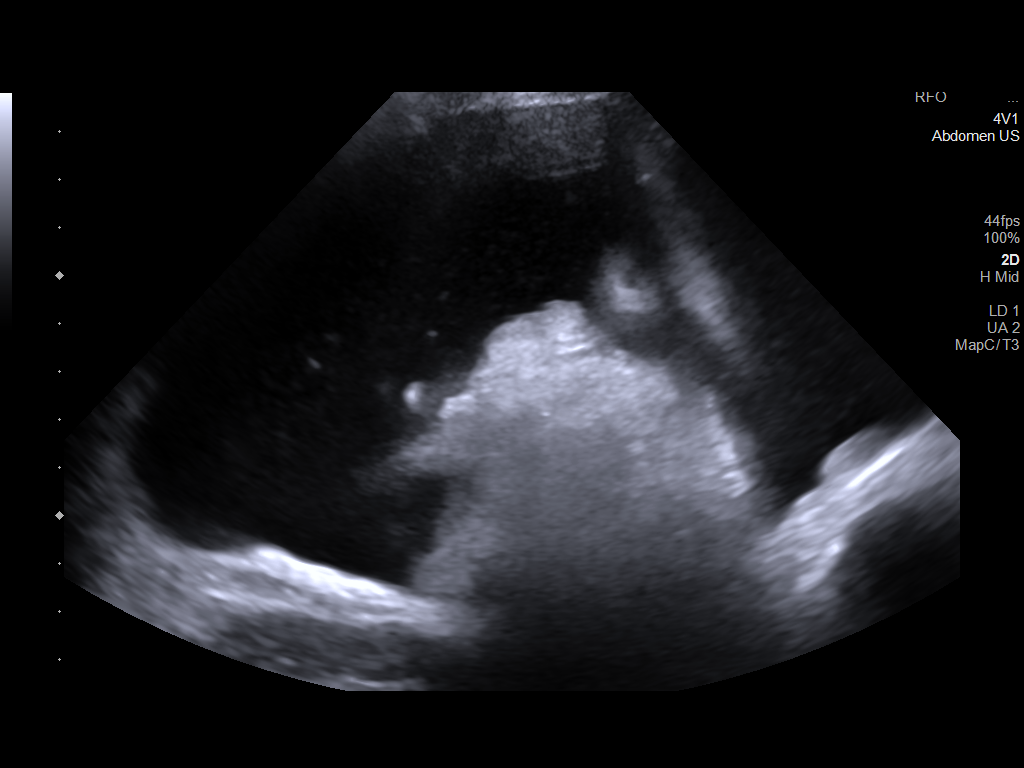
[im 2/2]
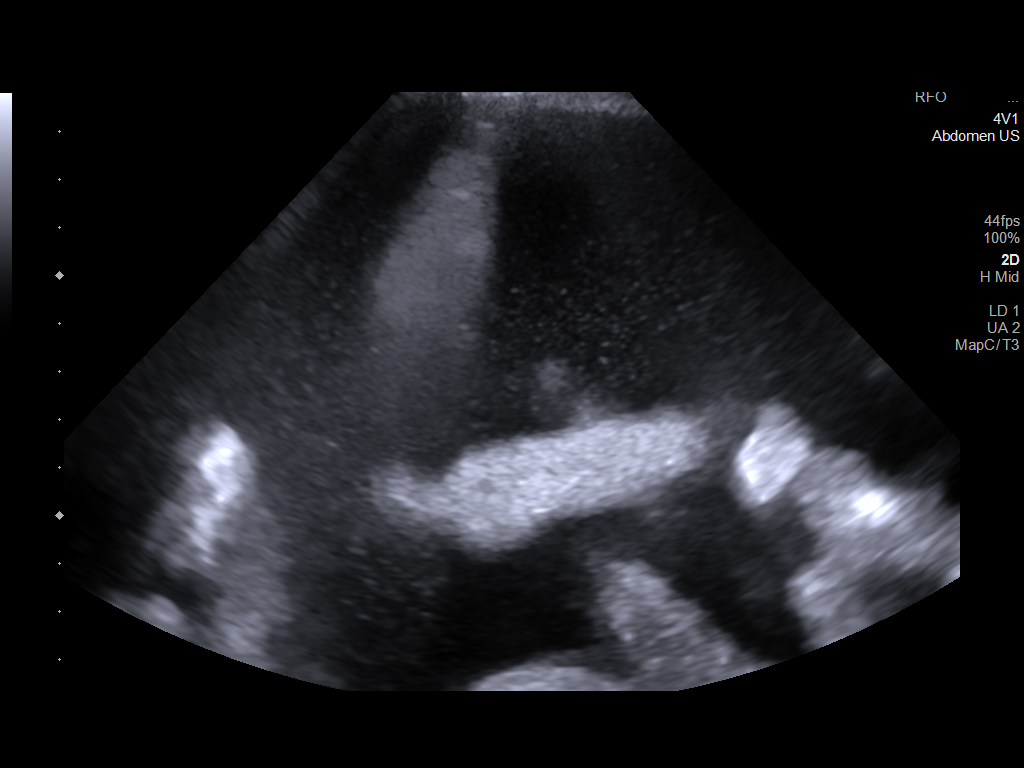

[2 of 2 positions shown; findings below may reference images not displayed]

EXAM:
ULTRASOUND GUIDED RLQ PARACENTESIS

MEDICATIONS:
10 cc 1% lidocaine.

COMPLICATIONS:
None immediate.

PROCEDURE:
Informed written consent was obtained from the patient after a
discussion of the risks, benefits and alternatives to treatment. A
timeout was performed prior to the initiation of the procedure.

Initial ultrasound scanning demonstrates a large amount of ascites
within the right lower abdominal quadrant. The right lower abdomen
was prepped and draped in the usual sterile fashion. 1% lidocaine
was used for local anesthesia.

Following this, a Yueh catheter was introduced. An ultrasound image
was saved for documentation purposes. The paracentesis was
performed. The catheter was removed and a dressing was applied. The
patient tolerated the procedure well without immediate post
procedural complication.
Patient received post-procedure intravenous albumin; see nursing
notes for details.
FINDINGS: A total of approximately 5 liters of yellow fluid was removed.
IMPRESSION: Successful ultrasound-guided paracentesis yielding 5 liters of
peritoneal fluid.

Read by

THOLENTZ

## 2021-07-06 NOTE — Telephone Encounter (Signed)
Pt said he was told by radiology to call us to schedule him another para in 10 days. He had one this morning. Please advise. [phone] ?

## 2021-07-06 NOTE — Progress Notes (Signed)
PT tolerated right sided paracentesis procedure well today and 5 Liters of clear yellow fluid removed with vital signs remaining stable. PT verbalized understanding of discharge instructions and ambulatory with no acute distress noted.  ?

## 2021-07-06 NOTE — Telephone Encounter (Signed)
Patient states that he is still feeling full. He is currently taking lasix and aldactone. Pt is going to have his bmp drawn on Fri when he has transportation set up. Pt was instructed to call us next wed and let us know how he is doing. Pt verbalized understanding.  ?

## 2021-07-06 NOTE — Procedures (Signed)
? ?  US guided RLQ paracentesis ? ?5 Liters yellow fluid obtained ?No labs per MD ? ?Tolerated well ? ?EBL: none ?

## 2021-07-08 ENCOUNTER — Other Ambulatory Visit (INDEPENDENT_AMBULATORY_CARE_PROVIDER_SITE_OTHER): Payer: Self-pay | Admitting: *Deleted

## 2021-07-08 DIAGNOSIS — K7030 Alcoholic cirrhosis of liver without ascites: Secondary | ICD-10-CM

## 2021-07-08 DIAGNOSIS — K7031 Alcoholic cirrhosis of liver with ascites: Secondary | ICD-10-CM

## 2021-07-09 LAB — BASIC METABOLIC PANEL
BUN: 14 mg/dL (ref 7–25)
CO2: 24 mmol/L (ref 20–32)
Calcium: 8.1 mg/dL — ABNORMAL LOW (ref 8.6–10.3)
Chloride: 109 mmol/L (ref 98–110)
Creat: 1.14 mg/dL (ref 0.70–1.30)
Glucose, Bld: 88 mg/dL (ref 65–99)
Potassium: 4.2 mmol/L (ref 3.5–5.3)
Sodium: 139 mmol/L (ref 135–146)

## 2021-07-13 ENCOUNTER — Other Ambulatory Visit: Payer: Self-pay | Admitting: Gastroenterology

## 2021-07-13 ENCOUNTER — Other Ambulatory Visit: Payer: Self-pay | Admitting: *Deleted

## 2021-07-13 DIAGNOSIS — K7031 Alcoholic cirrhosis of liver with ascites: Secondary | ICD-10-CM

## 2021-07-13 NOTE — Telephone Encounter (Signed)
Last office visit 06/16/21 ?

## 2021-07-14 ENCOUNTER — Ambulatory Visit (HOSPITAL_COMMUNITY): Payer: Medicaid Other

## 2021-07-15 ENCOUNTER — Other Ambulatory Visit: Payer: Self-pay

## 2021-07-15 ENCOUNTER — Encounter (HOSPITAL_COMMUNITY): Payer: Self-pay

## 2021-07-15 ENCOUNTER — Ambulatory Visit (HOSPITAL_COMMUNITY)
Admission: RE | Admit: 2021-07-15 | Discharge: 2021-07-15 | Disposition: A | Discharge status: Home / Self Care | Payer: Medicaid Other | Source: Ambulatory Visit | Attending: Gastroenterology | Admitting: Gastroenterology

## 2021-07-15 DIAGNOSIS — K7031 Alcoholic cirrhosis of liver with ascites: Secondary | ICD-10-CM | POA: Insufficient documentation

## 2021-07-15 LAB — BODY FLUID CELL COUNT WITH DIFFERENTIAL
Eos, Fluid: 0 %
Lymphs, Fluid: 51 %
Monocyte-Macrophage-Serous Fluid: 43 % — ABNORMAL LOW (ref 50–90)
Neutrophil Count, Fluid: 6 % (ref 0–25)
Total Nucleated Cell Count, Fluid: 390 cu mm (ref 0–1000)

## 2021-07-15 LAB — GRAM STAIN

## 2021-07-15 IMAGING — US US PARACENTESIS
1 series · 2 of 2 positions shown · non-contrast
Comparison: none

INDICATION: Ascites, alcoholic cirrhosis

[Series 1: us paracentesis · 0.21mm/px · 2 of 2 slices shown]
[im 1/2]
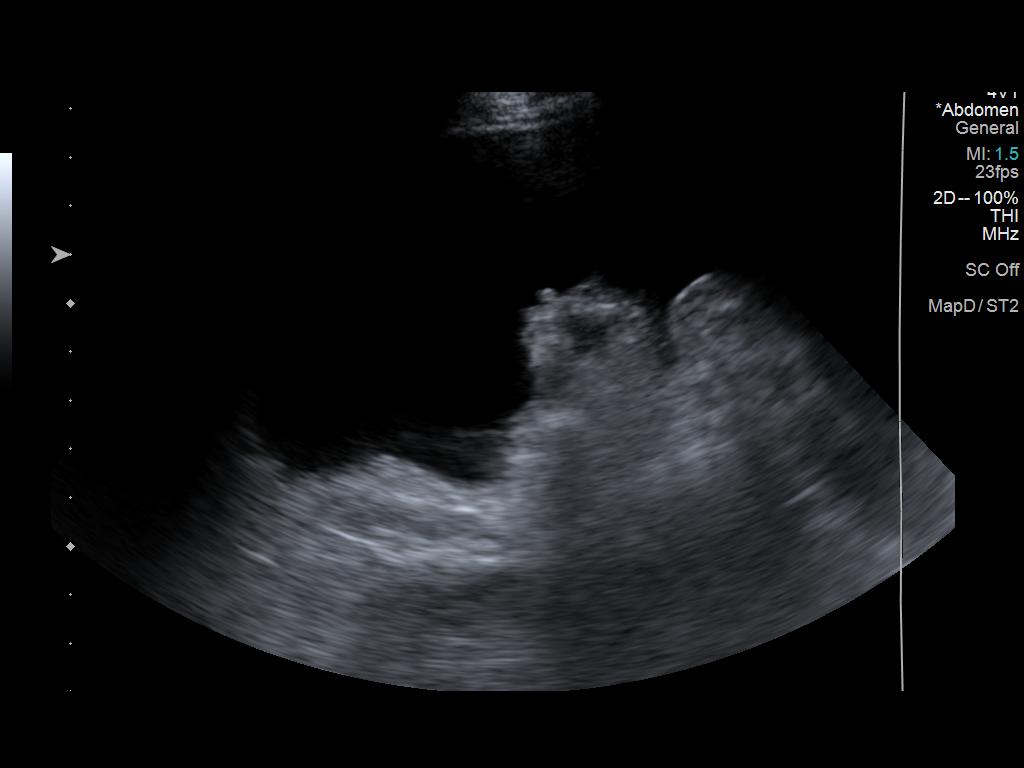
[im 2/2]
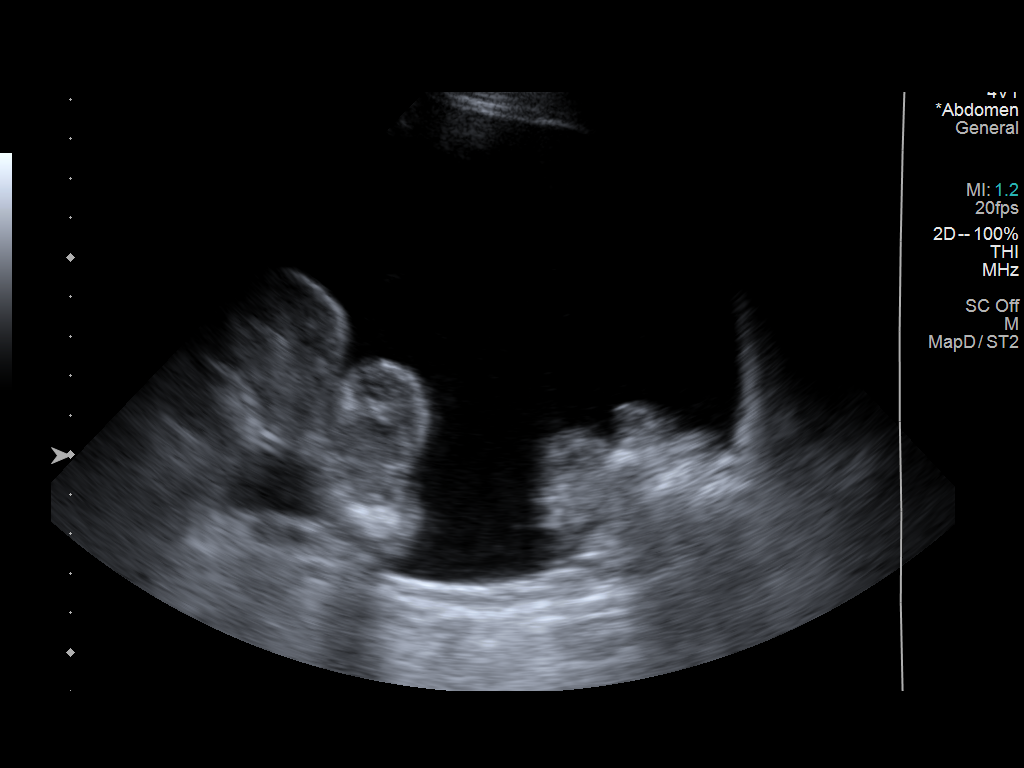

[2 of 2 positions shown; findings below may reference images not displayed]

EXAM:
ULTRASOUND GUIDED DIAGNOSTIC AND THERAPEUTIC PARACENTESIS

MEDICATIONS:
None.

COMPLICATIONS:
None immediate.

PROCEDURE:
Informed written consent was obtained from the patient after a
discussion of the risks, benefits and alternatives to treatment. A
timeout was performed prior to the initiation of the procedure.

Initial ultrasound scanning demonstrates a large amount of ascites
within the right lower abdominal quadrant. The right lower abdomen
was prepped and draped in the usual sterile fashion. 1% lidocaine
was used for local anesthesia.

Following this, a 7 French Yueh catheter was introduced. An
ultrasound image was saved for documentation purposes. The
paracentesis was performed. The catheter was removed and a dressing
was applied. The patient tolerated the procedure well without
immediate post procedural complication.
FINDINGS: A total of approximately 3.6 L of yellow ascitic fluid was removed.
Samples were sent to the laboratory as requested by the clinical
team.
IMPRESSION: Successful ultrasound-guided paracentesis yielding 3.6 liters of
peritoneal fluid.

## 2021-07-15 MED ORDER — ALBUMIN HUMAN 25 % IV SOLN: 0.0000 g | Freq: Once | INTRAVENOUS | Status: DC

## 2021-07-15 NOTE — Procedures (Signed)
PreOperative Dx: Alcoholic cirrhosis, ascites ?Postoperative Dx: Alcoholic cirrhosis, ascites ?Procedure:   US guided paracentesis ?Radiologist:  Thornton Papas ?Anesthesia:  10 ml of1% lidocaine ?Specimen:  3.6 L of yellow ascitic fluid ?EBL:   < 1 ml ?Complications:  None   ?

## 2021-07-15 NOTE — Progress Notes (Signed)
Patient tolerated right sided paracentesis procedure well today and 3.6 Liters of clear yellow fluid removed with labs collected and sent for processing. Pt verbalized understanding of discharge instructions and ambulatory at departure with no acute distress noted.  ?

## 2021-07-18 LAB — PATHOLOGIST SMEAR REVIEW

## 2021-07-20 LAB — BASIC METABOLIC PANEL
BUN: 18 mg/dL (ref 7–25)
CO2: 23 mmol/L (ref 20–32)
Calcium: 8.6 mg/dL (ref 8.6–10.3)
Chloride: 110 mmol/L (ref 98–110)
Creat: 1.22 mg/dL (ref 0.70–1.30)
Glucose, Bld: 98 mg/dL (ref 65–99)
Potassium: 4.2 mmol/L (ref 3.5–5.3)
Sodium: 141 mmol/L (ref 135–146)

## 2021-07-20 LAB — CULTURE, BODY FLUID W GRAM STAIN -BOTTLE: Culture: NO GROWTH

## 2021-07-25 ENCOUNTER — Other Ambulatory Visit: Payer: Self-pay | Admitting: *Deleted

## 2021-07-25 ENCOUNTER — Telehealth: Payer: Self-pay | Admitting: Internal Medicine

## 2021-07-25 DIAGNOSIS — K7031 Alcoholic cirrhosis of liver with ascites: Secondary | ICD-10-CM

## 2021-07-25 NOTE — Telephone Encounter (Signed)
Patient called and said he needs fluid taken off    please call  ?

## 2021-07-25 NOTE — Telephone Encounter (Signed)
Phoned pt and "wireless customer not available".  ?

## 2021-07-25 NOTE — Telephone Encounter (Signed)
We don't have standing orders on pt to schedule patient. Sending to nurse to get more info to send to provider ?

## 2021-07-25 NOTE — Telephone Encounter (Signed)
Spoke to pt, informed him of para appt. Dena then spoke to pt. ?

## 2021-07-25 NOTE — Telephone Encounter (Signed)
I spoke to the pt and was advised by him he was only taking 2 of each pill at the same time in the morning. I advised the pt that his Lasix should be 2 in the morning and 1 in the afternoons. With the other medication 2 pills in the morning and one pill at night. The pt expressed understanding.  ?

## 2021-07-25 NOTE — Telephone Encounter (Signed)
Called home # and was advised to call pt mobile #. Called that # and received message "wireless customer not available". ? ?PARA scheduled for Friday 3/31, arrival time 12:45pm. ?

## 2021-07-25 NOTE — Telephone Encounter (Signed)
This pt is needing standing orders as much as he is having to have fluid drawn off. Please advise ?

## 2021-07-25 NOTE — Telephone Encounter (Signed)
Noted I will also try again ?

## 2021-07-25 NOTE — Telephone Encounter (Signed)
He can have a repeat para but let's not do standing just yet ? ?Needs cell count and culture at time of para. Albumin per protocol.  ? ? ?Dena: can we see what diuretic therapy he is taking? Should be on Lasix 40 mg in morning, 20 mg in afternoon, and aldactone 100 mg in morning and 50 mg in afternoon. I may need to increase afternoon dosage.  ?

## 2021-07-26 ENCOUNTER — Telehealth: Payer: Self-pay | Admitting: Internal Medicine

## 2021-07-26 NOTE — Telephone Encounter (Signed)
Patient called and asked if we needed any lab work done for him this week? Please advise  ?

## 2021-07-26 NOTE — Telephone Encounter (Signed)
The pt phoned to see if he needed to do blood work this week or not? Please advise ?

## 2021-07-26 NOTE — Telephone Encounter (Signed)
Returned the pt's call "pt could not be reached at this time" ?

## 2021-07-26 NOTE — Telephone Encounter (Signed)
He needs to be on Lasix 40 mg in morning, 20 mg in afternoon, and spironolactone 100 mg in morning and 50 mg in afternoon. Then, repeat BMP in 1 week.  ?

## 2021-07-28 NOTE — Telephone Encounter (Signed)
Wireless called is not available ?

## 2021-07-29 ENCOUNTER — Ambulatory Visit (HOSPITAL_COMMUNITY)
Admission: RE | Admit: 2021-07-29 | Discharge: 2021-07-29 | Disposition: A | Discharge status: Home / Self Care | Payer: Medicaid Other | Source: Ambulatory Visit | Attending: Gastroenterology | Admitting: Gastroenterology

## 2021-07-29 ENCOUNTER — Encounter (HOSPITAL_COMMUNITY): Payer: Self-pay

## 2021-07-29 ENCOUNTER — Other Ambulatory Visit: Payer: Self-pay

## 2021-07-29 DIAGNOSIS — K7031 Alcoholic cirrhosis of liver with ascites: Secondary | ICD-10-CM | POA: Insufficient documentation

## 2021-07-29 DIAGNOSIS — R7989 Other specified abnormal findings of blood chemistry: Secondary | ICD-10-CM

## 2021-07-29 DIAGNOSIS — K7460 Unspecified cirrhosis of liver: Secondary | ICD-10-CM

## 2021-07-29 LAB — GRAM STAIN: Gram Stain: NONE SEEN

## 2021-07-29 LAB — BODY FLUID CELL COUNT WITH DIFFERENTIAL
Eos, Fluid: 0 %
Lymphs, Fluid: 42 %
Monocyte-Macrophage-Serous Fluid: 51 % (ref 50–90)
Neutrophil Count, Fluid: 7 % (ref 0–25)
Total Nucleated Cell Count, Fluid: 601 cu mm (ref 0–1000)

## 2021-07-29 IMAGING — US US PARACENTESIS
1 series · 4 of 4 positions shown · non-contrast
Comparison: none

INDICATION: Alcoholic cirrhosis with ascites

[Series 1: us paracentesis · 4 of 4 slices shown]
[im 1/4]
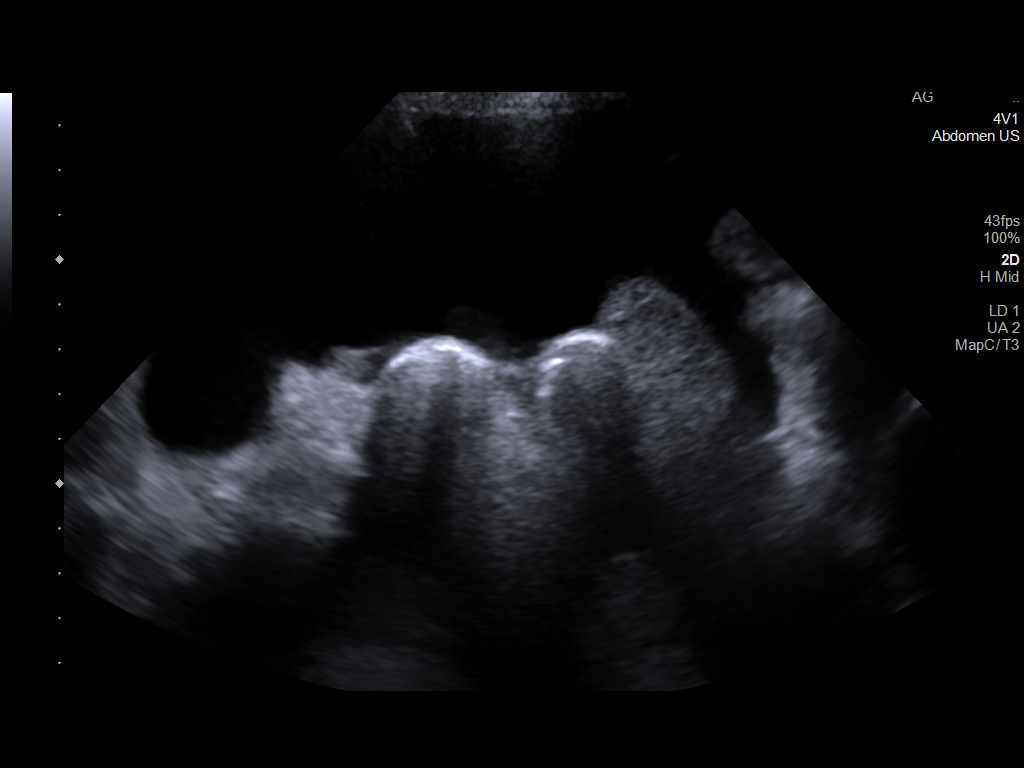
[im 2/4]
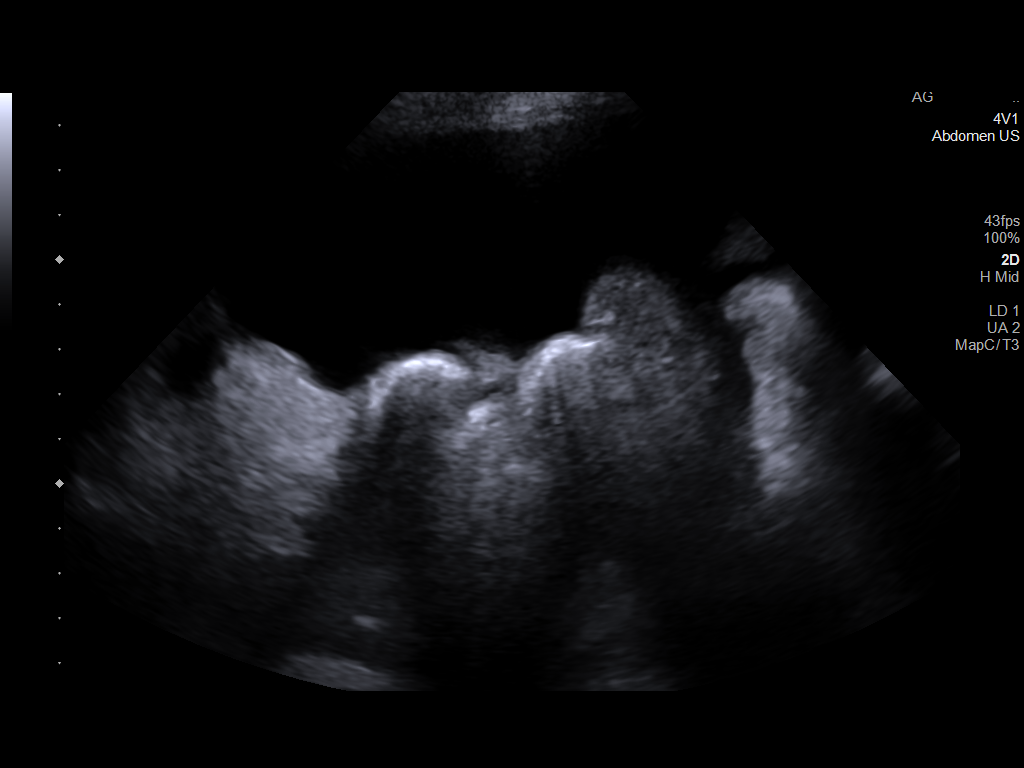
[im 3/4]
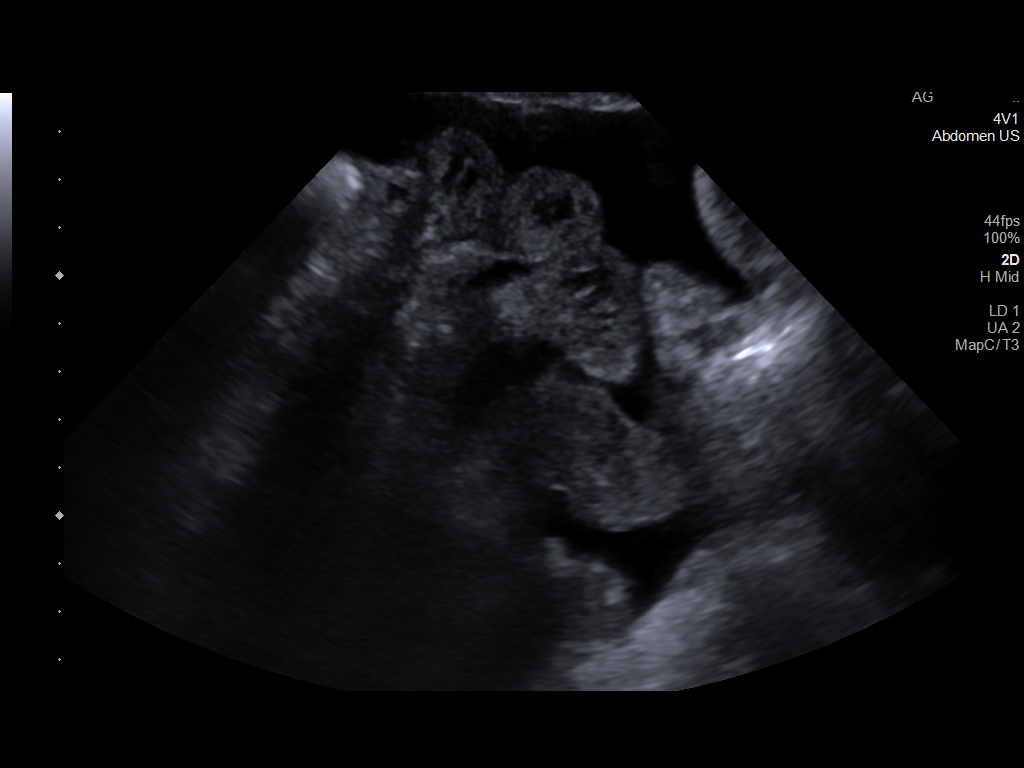
[im 4/4]
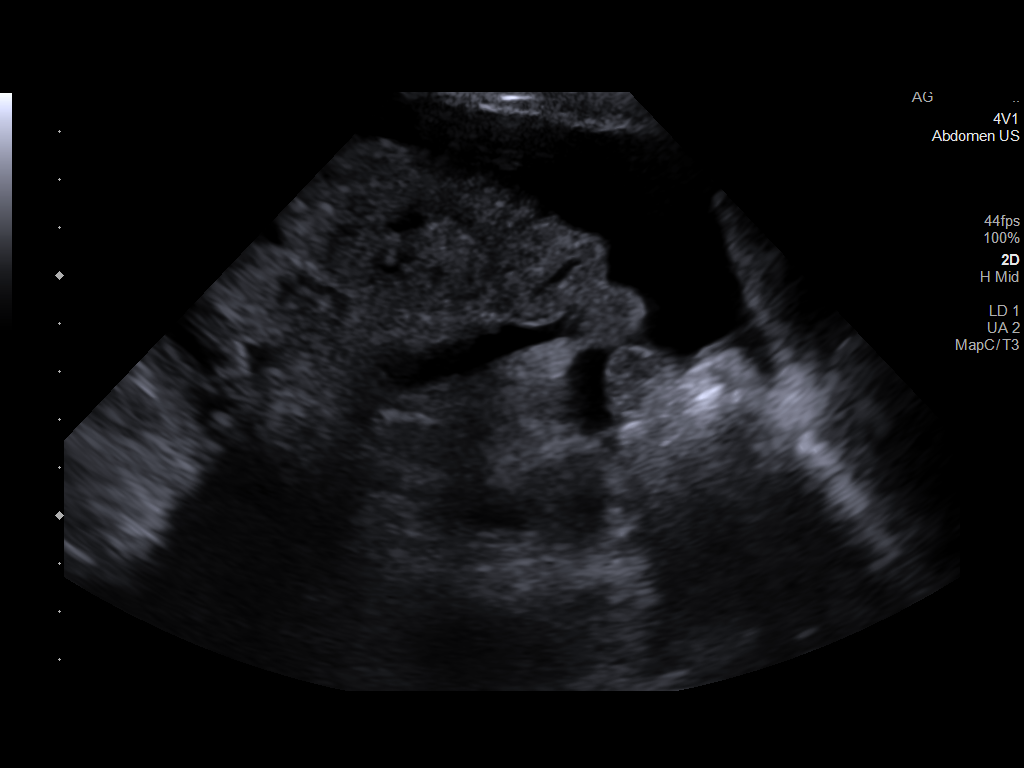

[4 of 4 positions shown; findings below may reference images not displayed]

EXAM:
ULTRASOUND GUIDED DIAGNOSTIC AND THERAPEUTIC PARACENTESIS

MEDICATIONS:
None.

COMPLICATIONS:
None immediate.

PROCEDURE:
Informed written consent was obtained from the patient after a
discussion of the risks, benefits and alternatives to treatment. A
timeout was performed prior to the initiation of the procedure.

Initial ultrasound scanning demonstrates a large amount of ascites
within the right lower abdominal quadrant. The right lower abdomen
was prepped and draped in the usual sterile fashion. 1% lidocaine
was used for local anesthesia.

Following this, a 5 French Yueh catheter was introduced. An
ultrasound image was saved for documentation purposes. The
paracentesis was performed. The catheter was removed and a dressing
was applied. The patient tolerated the procedure well without
immediate post procedural complication.
FINDINGS: A total of approximately 3.7 L of yellow ascitic fluid was removed.
Samples were sent to the laboratory as requested by the clinical
team.
IMPRESSION: Successful ultrasound-guided paracentesis yielding 3.7 liters of
peritoneal fluid.

## 2021-07-29 NOTE — Procedures (Signed)
PreOperative Dx: Alcoholic cirrhosis, ascites ?Postoperative Dx: Alcoholic cirrhosis, ascites ?Procedure:   US guided paracentesis ?Radiologist:  Thornton Papas ?Anesthesia:  10 ml of1% lidocaine ?Specimen:  3.7 L of clear yellow ascitic fluid ?EBL:   < 1 ml ?Complications: None   ?

## 2021-07-29 NOTE — Telephone Encounter (Signed)
FYI: ? ?Wireless customer called cannot be reached. Will send out a letter with instructions of blood work and medication. ?

## 2021-07-29 NOTE — Progress Notes (Signed)
PT tolerated right sided paracentesis procedure well today and 3.7 Liters of clear yellow fluid removed with labs collected and sent for processing. Pt verbalized understanding of discharge instructions and ambulatory at departure with no acute distress noted.  ?

## 2021-08-01 LAB — PATHOLOGIST SMEAR REVIEW

## 2021-08-03 DIAGNOSIS — E440 Moderate protein-calorie malnutrition: Secondary | ICD-10-CM | POA: Insufficient documentation

## 2021-08-03 DIAGNOSIS — K766 Portal hypertension: Secondary | ICD-10-CM | POA: Insufficient documentation

## 2021-08-03 DIAGNOSIS — R188 Other ascites: Secondary | ICD-10-CM | POA: Insufficient documentation

## 2021-08-03 LAB — CULTURE, BODY FLUID W GRAM STAIN -BOTTLE: Culture: NO GROWTH

## 2021-08-08 ENCOUNTER — Other Ambulatory Visit: Payer: Self-pay

## 2021-08-08 ENCOUNTER — Telehealth: Payer: Self-pay | Admitting: Internal Medicine

## 2021-08-08 DIAGNOSIS — K7031 Alcoholic cirrhosis of liver with ascites: Secondary | ICD-10-CM

## 2021-08-08 NOTE — Telephone Encounter (Signed)
We can do a para. Needs cell count and culture. Albumin per protocol. ?He needs an urgent visit THIS WEEK with any APP. We need to have him bring his diuretic medication. I am concerned he is not taking it appropriately. Will need to tweak this in person and consider additional imaging due to his need for repeated paras.  ?

## 2021-08-08 NOTE — Telephone Encounter (Signed)
Para scheduled for 08/10/21 at 10:00am, arrive at 9:45am. ? ?Tried to call pt, mobile# was wrong#. Called home#, LMOAM for return call. ?

## 2021-08-08 NOTE — Telephone Encounter (Signed)
PATIENT CALLED AND SAID HE NEEDS AN ORDER FOR A PARACENTESIS    ?

## 2021-08-08 NOTE — Telephone Encounter (Signed)
Please advise 

## 2021-08-08 NOTE — Telephone Encounter (Signed)
Spoke to pt, informed him of para appt and OV. Advised him to bring medications to OV. Verbalized understanding. ?

## 2021-08-09 NOTE — Progress Notes (Signed)
? ? ?Referring Provider: Carrolyn Meiers* ?Primary Care Physician:  Carrolyn Meiers, MD ?Primary GI Physician: Dr. Gala Romney ? ?Chief Complaint  ?Patient presents with  ? Cirrhosis  ? ? ?HPI:   ?[name] [name] is a 56 y.o. 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 ? ?He was referred to liver clinic [facility] for transplant evaluation on 3/2.   ? ?He saw Roosevelt Locks on 4/5 Noted patient was not following a strict low-sodium diet.  Recommended further serologic evaluation of cirrhosis.  Also recommended repeat EGD as he has had progressive decompensation since his last EGD and this would allow revisualization of previously reported abnormal appearing ampulla.  No diuretic adjustments made as he was mildly hypotensive.  Stated he may be a reasonable candidate for TIPS in the future, but recommended optimizing fluid volume via diuretics, sodium restriction, better clinical nutrition. ? ?Labs 08/03/2021: IgG elevated at 2757, IgM within normal limits.  AMA and ANA negative, ASMA elevated at 25.  No alpha-1 antitrypsin deficiency.  aFP within normal limits.  Hep B surface antibody not consistent with immunity.  Hep B core antibody negative.  Alk phos elevated at 138, otherwise liver enzymes and bilirubin within normal limits.  MELD Na 11.  ? ? ?Today:  ?Continues to feel that his abdomen is tight, primarily across the upper abdomen.   Abdominal distention has much improved/resolved.  Lower extremity edema has also resolved.  He is taking spironolactone 50 mg 2 in the morning and 1 in the afternoon, furosemide 40 mg 2 in the morning and 1 in the afternoon.  Denies any worsening abdominal pain with eating.  Reports the pain was not present when he had his MRI in March. Denies nausea, vomiting, early satiety, constipation, diarrhea, BRBPR, melena.  Documented 30 pound weight loss over the last 2 months. ? ?Eats 3 meals a day and drinks 2 Ensure daily.  He thinks Ensure contain 30 g of protein.  24-hour recall includes to politics and follow cereal for breakfast, sandwich with ham and cheese for lunch, salad, Steak, mashed potatoes and gravy for dinner. ? ?No confusion or mental status changes.  ? ?Ascites history: ?- First paracentesis in January 2023 yielding 6 L. ?- Abdominal ultrasound 2/3 revealed patent portal vein on Doppler imaging with normal directional blood flow towards the liver. ?- Paracentesis 06/20/2021 yielding 6 L. No SBP.  ?- Lasix increased to 40 mg daily on 06/16/2021.  Aldactone 100 mg daily was continued. ?- Paracentesis 3/8 yielding 5 L.  ?- Paracentesis 3/17 yielding 3.6 L. No SBP.  ?- Diuretics again increased on 3/27 to Lasix 40 mg in the morning with Aldactone 100 mg and Lasix 20 mg without doctor and 50 mg in the evening. ?- Paracentesis 3/31 yielding 3.7 L. No SBP.  ?- He was scheduled for paracentesis on 4/12, but he was found to have minimal ascites, insufficient for para. ? ? ?Cr up to 1.54 on 4/5 from 1.22 on 3/21. Yesterday, Cr increased to 2.25.  ? ? ?Needs EGD. ?Likely TIPS  once recovered.  ?May need liver biopsy. Will need to discuss with Dawn who ordered additional labs.  ?Due for colonoscopy this year.  ? ?Past Medical History:  ?Diagnosis Date  ? Asthma   ? Cirrhosis ([place])   ? Dyspnea   ? High cholesterol   ? History of kidney stones   ? Hypertension   ? Kidney stones   ? ? ?Past Surgical History:  ?Procedure  Laterality Date  ? APPENDECTOMY    ? BIOPSY  07/22/2020  ? Procedure: BIOPSY;  Surgeon: Daneil Dolin, MD;  [facility]: [facility];  Service: Endoscopy;;  ? COLONOSCOPY WITH PROPOFOL N/A 04/18/2017  ? non-bleeding internal hemorrhoids, two 4-6 mm polyps in descending colon and cecum, pancolonic diverticulosis, single cecal AVM. Tubular adenomas, surveillance in 2023.   ? ESOPHAGOGASTRODUODENOSCOPY (EGD) WITH PROPOFOL N/A 07/22/2020  ? normal esophagus, small hiatal hernia, abnormal gastric mucosa, abnormal appearing ampula and periampullary mucosa. Mild chronic gastritis.  ? FRACTURE SURGERY    ? left arm  ? IR PARACENTESIS  05/31/2021  ? LOWER EXTREMITY VENOGRAPHY N/A 08/10/2020  ? Procedure: LOWER EXTREMITY VENOGRAPHY;  Surgeon: Waynetta Sandy, MD;  Location: [place] [facility];  Service: Cardiovascular;  Laterality: N/A;  ? PERIPHERAL VASCULAR BALLOON ANGIOPLASTY Left 08/10/2020  ? Procedure: PERIPHERAL VASCULAR BALLOON ANGIOPLASTY;  Surgeon: Waynetta Sandy, MD;  Location: [place] [facility];  Service: Cardiovascular;  Laterality: Left;  lower extremity venous  ? PERIPHERAL VASCULAR THROMBECTOMY N/A 08/10/2020  ? Procedure: PERIPHERAL VASCULAR THROMBECTOMY;  Surgeon: Waynetta Sandy, MD;  Location: [place] [facility];  Service: Cardiovascular;  Laterality: N/A;  ? POLYPECTOMY  04/18/2017  ? Procedure: POLYPECTOMY;  Surgeon: Daneil Dolin, MD;  [facility];  Service: Endoscopy;;  ? ? ?Current Outpatient Medications  ?Medication Sig Dispense Refill  ? acetaminophen (TYLENOL) 500 MG tablet Take 1,000 mg by mouth every 6 (six) hours as needed.    ? albuterol (PROVENTIL HFA) 108 (90 Base) MCG/ACT inhaler INHALE 2 PUFFS BY MOUTH EVERY 6 HOURS AS NEEDED FOR COUGHING, WHEEZING, OR SHORTNESS OF BREATH 18 g 1  ? amLODipine (NORVASC) 10 MG tablet Take 10 mg by mouth daily.    ? aspirin 81 MG chewable tablet Chew 81 mg by mouth daily.    ? atorvastatin (LIPITOR) 20 MG tablet Take 20  mg by mouth daily.    ? furosemide (LASIX) 20 MG tablet Take 2 tablets in morning and 1 tablet in the afternoon. (40 milligrams in morning and 20 milligrams in afternoon). 90 tablet 3  ? metoprolol tartrate (LOPRESSOR) 25 MG tablet Take 25 mg by mouth 2 (two) times daily.    ? mometasone-formoterol (DULERA) 200-5 MCG/ACT AERO Inhale 2 puffs into the lungs 2 (two) times daily. 13 g 3  ? pantoprazole (PROTONIX) 40 MG tablet Take 1 tablet (40 mg total) by mouth daily. 30 tablet 1  ? spironolactone (ALDACTONE) 50 MG tablet Take 50 mg by mouth 2 (two) times daily.    ? nicotine (NICODERM CQ - DOSED IN MG/24 HOURS) 14 mg/24hr patch Place 1 patch (14 mg total) onto the skin daily. (Patient not taking: Reported on 08/11/2021) 14 patch 0  ? ?No current facility-administered medications for this visit.  ? ? ?Allergies as of 08/11/2021  ? (No Known Allergies)  ? ? ?Family History  ?Problem Relation Age of Onset  ? Cancer Father   ?     throat  ? Diabetes Sister   ? Colon cancer Neg  Hx   ? ? ?Social History  ? ?Socioeconomic History  ? Marital status: Single  ?  Spouse name: Not on file  ? Number of children: Not on file  ? Years of education: Not on file  ? Highest education level: Not on file  ?Occupational History  ? Not on file  ?Tobacco Use  ? Smoking status: Every Day  ?  Packs/day: 0.50  ?  Years: 33.00  ?  Pack years: 16.50  ?  Types: Cigarettes  ? Smokeless tobacco: Never  ?Vaping Use  ? Vaping Use: Never used  ?Substance and Sexual Activity  ? Alcohol use: No  ?  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 Drug use: No  ? Sexual activity: Yes  ?  Birth control/protection: None  ?Other Topics Concern  ? Not on file  ?Social History Narrative  ? Not on file  ? ?Social Determinants of Health  ? ?Financial Resource Strain: High Risk  ? Difficulty of Paying Living Expenses: Very hard  ?Food Insecurity: No Food Insecurity  ? Worried About Charity fundraiser  in the Last Year: Never true  ? Ran Out of Food in the Last Year: Never true  ?Transportation Needs: No Transportation Needs  ? Lack of Transportation (Medical): No  ? Lack of Transportation (Non-Medical): N

## 2021-08-10 ENCOUNTER — Ambulatory Visit (HOSPITAL_COMMUNITY)
Admission: RE | Admit: 2021-08-10 | Discharge: 2021-08-10 | Disposition: A | Discharge status: Home / Self Care | Payer: Medicaid Other | Source: Ambulatory Visit | Attending: Gastroenterology | Admitting: Gastroenterology

## 2021-08-10 ENCOUNTER — Other Ambulatory Visit: Payer: Self-pay | Admitting: Gastroenterology

## 2021-08-10 DIAGNOSIS — K7031 Alcoholic cirrhosis of liver with ascites: Secondary | ICD-10-CM

## 2021-08-11 ENCOUNTER — Encounter: Payer: Self-pay | Admitting: Gastroenterology

## 2021-08-11 ENCOUNTER — Emergency Department (HOSPITAL_COMMUNITY)
Admission: EM | Admit: 2021-08-11 | Discharge: 2021-08-11 | Disposition: A | Discharge status: Home / Self Care | Payer: Medicaid Other | Attending: Emergency Medicine | Admitting: Emergency Medicine

## 2021-08-11 ENCOUNTER — Encounter (HOSPITAL_COMMUNITY): Payer: Self-pay | Admitting: Emergency Medicine

## 2021-08-11 ENCOUNTER — Ambulatory Visit (INDEPENDENT_AMBULATORY_CARE_PROVIDER_SITE_OTHER): Payer: Medicaid Other | Admitting: Gastroenterology

## 2021-08-11 ENCOUNTER — Other Ambulatory Visit: Payer: Self-pay

## 2021-08-11 VITALS — BP 116/62 | HR 74 | Temp 98.2°F | Ht 72.0 in | Wt 142.6 lb

## 2021-08-11 DIAGNOSIS — J45909 Unspecified asthma, uncomplicated: Secondary | ICD-10-CM | POA: Diagnosis not present

## 2021-08-11 DIAGNOSIS — Z7982 Long term (current) use of aspirin: Secondary | ICD-10-CM | POA: Diagnosis not present

## 2021-08-11 DIAGNOSIS — R634 Abnormal weight loss: Secondary | ICD-10-CM

## 2021-08-11 DIAGNOSIS — N179 Acute kidney failure, unspecified: Secondary | ICD-10-CM | POA: Diagnosis not present

## 2021-08-11 DIAGNOSIS — R140 Abdominal distension (gaseous): Secondary | ICD-10-CM | POA: Diagnosis not present

## 2021-08-11 DIAGNOSIS — Z87442 Personal history of urinary calculi: Secondary | ICD-10-CM | POA: Insufficient documentation

## 2021-08-11 DIAGNOSIS — E871 Hypo-osmolality and hyponatremia: Secondary | ICD-10-CM | POA: Insufficient documentation

## 2021-08-11 DIAGNOSIS — I10 Essential (primary) hypertension: Secondary | ICD-10-CM | POA: Insufficient documentation

## 2021-08-11 DIAGNOSIS — D649 Anemia, unspecified: Secondary | ICD-10-CM | POA: Diagnosis not present

## 2021-08-11 DIAGNOSIS — R109 Unspecified abdominal pain: Secondary | ICD-10-CM | POA: Diagnosis present

## 2021-08-11 DIAGNOSIS — K7460 Unspecified cirrhosis of liver: Secondary | ICD-10-CM

## 2021-08-11 DIAGNOSIS — R748 Abnormal levels of other serum enzymes: Secondary | ICD-10-CM | POA: Insufficient documentation

## 2021-08-11 DIAGNOSIS — R1010 Upper abdominal pain, unspecified: Secondary | ICD-10-CM | POA: Diagnosis not present

## 2021-08-11 DIAGNOSIS — Z7951 Long term (current) use of inhaled steroids: Secondary | ICD-10-CM | POA: Insufficient documentation

## 2021-08-11 DIAGNOSIS — F17200 Nicotine dependence, unspecified, uncomplicated: Secondary | ICD-10-CM | POA: Insufficient documentation

## 2021-08-11 DIAGNOSIS — R1011 Right upper quadrant pain: Secondary | ICD-10-CM

## 2021-08-11 HISTORY — DX: Unspecified cirrhosis of liver: K74.60

## 2021-08-11 LAB — COMPREHENSIVE METABOLIC PANEL
ALT: 15 U/L (ref 0–44)
AST: 23 U/L (ref 15–41)
Albumin: 2.9 g/dL — ABNORMAL LOW (ref 3.5–5.0)
Alkaline Phosphatase: 91 U/L (ref 38–126)
Anion gap: 11 (ref 5–15)
BUN: 31 mg/dL — ABNORMAL HIGH (ref 6–20)
CO2: 19 mmol/L — ABNORMAL LOW (ref 22–32)
Calcium: 8.7 mg/dL — ABNORMAL LOW (ref 8.9–10.3)
Chloride: 98 mmol/L (ref 98–111)
Creatinine, Ser: 1.77 mg/dL — ABNORMAL HIGH (ref 0.61–1.24)
GFR, Estimated: 45 mL/min — ABNORMAL LOW (ref >60)
Glucose, Bld: 89 mg/dL (ref 70–99)
Potassium: 4.4 mmol/L (ref 3.5–5.1)
Sodium: 128 mmol/L — ABNORMAL LOW (ref 135–145)
Total Bilirubin: 0.3 mg/dL (ref 0.3–1.2)
Total Protein: 8.3 g/dL — ABNORMAL HIGH (ref 6.5–8.1)

## 2021-08-11 LAB — CBC WITH DIFFERENTIAL/PLATELET
Abs Immature Granulocytes: 0.02 10*3/uL (ref 0.00–0.07)
Basophils Absolute: 0.1 10*3/uL (ref 0.0–0.1)
Basophils Relative: 1 %
Eosinophils Absolute: 0.2 10*3/uL (ref 0.0–0.5)
Eosinophils Relative: 2 %
HCT: 36.9 % — ABNORMAL LOW (ref 39.0–52.0)
Hemoglobin: 12.2 g/dL — ABNORMAL LOW (ref 13.0–17.0)
Immature Granulocytes: 0 %
Lymphocytes Relative: 26 %
Lymphs Abs: 2 10*3/uL (ref 0.7–4.0)
MCH: 28.4 pg (ref 26.0–34.0)
MCHC: 33.1 g/dL (ref 30.0–36.0)
MCV: 86 fL (ref 80.0–100.0)
Monocytes Absolute: 1 10*3/uL (ref 0.1–1.0)
Monocytes Relative: 13 %
Neutro Abs: 4.4 10*3/uL (ref 1.7–7.7)
Neutrophils Relative %: 58 %
Platelets: 336 10*3/uL (ref 150–400)
RBC: 4.29 MIL/uL (ref 4.22–5.81)
RDW: 14.4 % (ref 11.5–15.5)
WBC: 7.7 10*3/uL (ref 4.0–10.5)
nRBC: 0 % (ref 0.0–0.2)

## 2021-08-11 LAB — BASIC METABOLIC PANEL
BUN/Creatinine Ratio: 13 (calc) (ref 6–22)
BUN: 29 mg/dL — ABNORMAL HIGH (ref 7–25)
CO2: 23 mmol/L (ref 20–32)
Calcium: 9.6 mg/dL (ref 8.6–10.3)
Chloride: 102 mmol/L (ref 98–110)
Creat: 2.25 mg/dL — ABNORMAL HIGH (ref 0.70–1.30)
Glucose, Bld: 103 mg/dL — ABNORMAL HIGH (ref 65–99)
Potassium: 4.5 mmol/L (ref 3.5–5.3)
Sodium: 133 mmol/L — ABNORMAL LOW (ref 135–146)

## 2021-08-11 LAB — LIPASE, BLOOD: Lipase: 58 U/L — ABNORMAL HIGH (ref 11–51)

## 2021-08-11 MED ORDER — SODIUM CHLORIDE 0.9 % IV BOLUS
1000.0000 mL | Freq: Once | INTRAVENOUS | Status: DC
Start: 2021-08-11 — End: 2021-08-11

## 2021-08-11 MED ORDER — SODIUM CHLORIDE 0.9 % IV BOLUS
500.0000 mL | Freq: Once | INTRAVENOUS | Status: AC
  Administered 2021-08-11: 500 mL via INTRAVENOUS

## 2021-08-11 NOTE — Discharge Instructions (Signed)
Please come to the front desk tomorrow and they will direct you only need to go for your ultrasound.  Otherwise I would like for you to follow-up with your gastroenterologist in 1 week for further evaluation. ?

## 2021-08-11 NOTE — Patient Instructions (Addendum)
You have an acute injury to your kidney.  You need to proceed to [facility] emergency department for this to be treated. ? ?Your fluid pills will also need to be adjusted while you are in the hospital. ? ?You can also have your upper abdominal pain evaluated while you are in the hospital. ? ?You will need an upper endoscopy in the near future.  We will work on scheduling this after you have been treated at the hospital.  ? ?Nutrition:  ?High-protein diet from a primarily plant-based diet. ?Consume at least 75 g of protein daily.  ?Avoid red meat.  No raw or undercooked meat, seafood, or shellfish. ?Low-fat/cholesterol/carbohydrate diet. ?Limit sodium to no more than 2000 mg/day including everything that you eat and drink. ?Recommend at least 30 minutes of aerobic and resistance exercise 3 days/week. ? ?Aliene Altes, PA-C ?[facility] ? ?

## 2021-08-11 NOTE — ED Triage Notes (Signed)
Pt sent from GI due to AKI and r and L lower abd pain. Abd pain "for months" pt does have cirrhosis. A/o. Ambulatory. Denies n/v/d.  ?

## 2021-08-11 NOTE — ED Provider Notes (Signed)
?[facility] ?Provider Note ? ? ?CSN: [id number] ?Arrival date & time: 08/11/21  1946 ? ?  ? ?History ?Chief Complaint  ?Patient presents with  ? Abdominal Pain  ? ? ?[name] [name] is a 56 y.o. male with history of alcoholic cirrhosis, hereditary hemochromatosis who was sent to the emergency department from his gastroenterologist secondary to an AKI.  Patient had labs drawn yesterday that showed a creatinine of 2.25 which was up from 1.22 on 07/19/2021.  Per gastroenterologist note, they recommended CBC, CMP, and right upper quadrant ultrasound.  Patient today complains of some abdominal distention but he states it is significantly improved from previous.  He also notes some upper abdominal tightness.  He has been eating and drinking normally.  He denies any urinary symptoms.  No nausea, vomiting, diarrhea, fever, chills, chest pain, shortness of breath. ? ? ?Abdominal Pain ? ?  ? ?Home Medications ?Prior to Admission medications   ?Medication Sig Start Date End Date Taking? Authorizing Provider  ?acetaminophen (TYLENOL) 500 MG tablet Take 1,000 mg by mouth every 6 (six) hours as needed.    [provider]  ?albuterol (PROVENTIL HFA) 108 (90 Base) MCG/ACT inhaler INHALE 2 PUFFS BY MOUTH EVERY 6 HOURS AS NEEDED FOR COUGHING, WHEEZING, OR SHORTNESS OF BREATH 02/10/21   Denton Brick, Courage, MD  ?amLODipine (NORVASC) 10 MG tablet Take 10 mg by mouth daily.    [provider]  ?aspirin 81 MG chewable tablet Chew 81 mg by mouth daily.    [provider]  ?atorvastatin (LIPITOR) 20 MG tablet Take 20 mg by mouth daily. 02/27/21   [provider]  ?furosemide (LASIX) 20 MG tablet Take 2 tablets in morning and 1 tablet in the afternoon. (40 milligrams in morning and 20 milligrams in afternoon). 07/13/21   Annitta Needs, NP  ?metoprolol tartrate (LOPRESSOR) 25 MG tablet Take 25 mg by mouth 2 (two) times daily. 05/24/21   [provider]  ?mometasone-formoterol (DULERA)  200-5 MCG/ACT AERO Inhale 2 puffs into the lungs 2 (two) times daily. 02/10/21   Roxan Hockey, MD  ?nicotine (NICODERM CQ - DOSED IN MG/24 HOURS) 14 mg/24hr patch Place 1 patch (14 mg total) onto the skin daily. ?Patient not taking: Reported on 08/11/2021 02/10/21   Roxan Hockey, MD  ?pantoprazole (PROTONIX) 40 MG tablet Take 1 tablet (40 mg total) by mouth daily. 02/10/21 02/10/22  Roxan Hockey, MD  ?spironolactone (ALDACTONE) 50 MG tablet Take 50 mg by mouth 2 (two) times daily.    [provider]  ?   ? ?Allergies    ?Patient has no known allergies.   ? ?Review of Systems   ?Review of Systems  ?Gastrointestinal:  Positive for abdominal pain.  ?All other systems reviewed and are negative. ? ?Physical Exam ?Updated Vital Signs ?BP 118/78 (BP Location: Right Arm)   Pulse 70   Temp 97.8 ?F (36.6 ?C) (Oral)   Resp 16   SpO2 99%  ?Physical Exam ?Vitals and nursing note reviewed.  ?Constitutional:   ?   General: He is not in acute distress. ?   Appearance: Normal appearance.  ?HENT:  ?   Head: Normocephalic and atraumatic.  ?Eyes:  ?   General:     ?   Right eye: No discharge.     ?   Left eye: No discharge.  ?Cardiovascular:  ?   Comments: Regular rate and rhythm.  S1/S2 are distinct without any evidence of murmur, rubs, or gallops.  Radial pulses  are 2+ bilaterally.  Dorsalis pedis pulses are 2+ bilaterally.  No evidence of pedal edema. ?Pulmonary:  ?   Comments: Clear to auscultation bilaterally.  Normal effort.  No respiratory distress.  No evidence of wheezes, rales, or rhonchi heard throughout. ?Abdominal:  ?   General: Abdomen is flat. Bowel sounds are normal. There is no distension.  ?   Tenderness: There is no abdominal tenderness. There is no guarding or rebound.  ?   Comments: Distended abdomen.  ?Musculoskeletal:     ?   General: Normal range of motion.  ?   Cervical back: Neck supple.  ?Skin: ?   General: Skin is warm and dry.  ?   Findings: No rash.  ?Neurological:  ?   General: No  focal deficit present.  ?   Mental Status: He is alert.  ?Psychiatric:     ?   Mood and Affect: Mood normal.     ?   Behavior: Behavior normal.  ? ? ?ED Results / Procedures / Treatments   ?Labs ?(all labs ordered are listed, but only abnormal results are displayed) ?Labs Reviewed  ?COMPREHENSIVE METABOLIC PANEL - Abnormal; Notable for the following components:  ?    Result Value  ? Sodium 128 (*)   ? CO2 19 (*)   ? BUN 31 (*)   ? Creatinine, Ser 1.77 (*)   ? Calcium 8.7 (*)   ? Total Protein 8.3 (*)   ? Albumin 2.9 (*)   ? GFR, Estimated 45 (*)   ? All other components within normal limits  ?CBC WITH DIFFERENTIAL/PLATELET - Abnormal; Notable for the following components:  ? Hemoglobin 12.2 (*)   ? HCT 36.9 (*)   ? All other components within normal limits  ?LIPASE, BLOOD - Abnormal; Notable for the following components:  ? Lipase 58 (*)   ? All other components within normal limits  ? ? ?EKG ?None ? ?Radiology ?[place] ASCITES (ABDOMEN LIMITED) ? ?Result Date: 08/10/2021 ?CLINICAL DATA:  Ascites EXAM: LIMITED ABDOMEN ULTRASOUND FOR ASCITES TECHNIQUE: Limited ultrasound survey for ascites was performed in all four abdominal quadrants. COMPARISON:  07/29/2021 FINDINGS: Minimal ascites identified. Volume of ascites is insufficient for expected therapeutic benefit of paracentesis. Paracentesis not performed. IMPRESSION: Minimal ascites, insufficient for paracentesis. Electronically Signed   By: Lavonia Dana M.D.   On: 08/10/2021 09:32   ? ?Procedures ?Procedures  ? ? ?Medications Ordered in ED ?Medications  ?sodium chloride 0.9 % bolus 500 mL (500 mLs Intravenous New Bag/Given 08/11/21 2119)  ? ? ?ED Course/ Medical Decision Making/ A&P ?Clinical Course as of 08/11/21 2205  ?Thu Aug 11, 2021  ?2203 I discussed this case with my attending physician who cosigned this note including patient's presenting symptoms, physical exam, and planned diagnostics and interventions. Attending physician stated agreement with plan or made  changes to plan which were implemented.  ? ? [CF]  ?  ?Clinical Course User Index ?[CF] Myna Bright M, PA-C  ? ?                        ?Medical Decision Making ?Amount and/or Complexity of Data Reviewed ?Labs: ordered. ?Radiology: ordered. ? ? ?This patient presents to the ED for concern of elevated creatinine concern for AKI, this involves an extensive number of treatment options, and is a complaint that carries with it a high risk of complications and morbidity.  The differential diagnosis includes AKI, electrolyte abnormalities. ? ? ?Co morbidities that complicate  the patient evaluation ? ?Past Medical History:  ?Diagnosis Date  ? Asthma   ? Cirrhosis ([place])   ? Dyspnea   ? High cholesterol   ? History of kidney stones   ? Hypertension   ? Kidney stones   ? ? ?Additional history obtained: ? ?Additional history obtained from nursing note ?External records from outside source obtained and reviewed including gastroenterology note from earlier today.  This is highlighted in the HPI ? ? ?Lab Tests: ? ?I Ordered, and personally interpreted labs.  The pertinent results include: CBC without evidence of leukocytosis.  There is evidence of chronic anemia but today's value is significantly improved from baseline.  Lipase is slightly elevated.  CMP shows hyponatremia and elevated creatinine of 1.77 today this is significantly improved from 2.25. ? ? ?Imaging Studies ordered: ? ?I ordered a right upper quadrant ultrasound but ultrasound left after 5 PM today.  I then ordered a CT abdomen pelvis without contrast patient had a normal right upper quadrant ultrasound yesterday when reviewing old records.  Patient is in no acute distress and has no somatic complaints at this time.  I will schedule a right upper quadrant ultrasound for tomorrow morning.  Shared decision-making was done at the bedside with the patient.  He is amenable this plan. ? ? ?Cardiac Monitoring: ? ?The patient was maintained on a cardiac monitor.  I  personally viewed and interpreted the cardiac monitored which showed an underlying rhythm of: Normal sinus rhythm ? ? ?Medicines ordered and prescription drug management: ? ?I ordered medication including fluid bol

## 2021-08-12 ENCOUNTER — Telehealth: Payer: Self-pay | Admitting: Gastroenterology

## 2021-08-12 DIAGNOSIS — R7989 Other specified abnormal findings of blood chemistry: Secondary | ICD-10-CM

## 2021-08-12 DIAGNOSIS — K7469 Other cirrhosis of liver: Secondary | ICD-10-CM

## 2021-08-12 NOTE — Telephone Encounter (Signed)
BMP order placed

## 2021-08-12 NOTE — Telephone Encounter (Signed)
Reviewed ER evaluation from yesterday for AKI.  In the emergency room, labs revealed a creatinine of 1.77 which was down from 2.25 on 4/12, LFTs were normal, sodium low at 128, hemoglobin 12.2, up from 9.5, 55-monthago, white blood cell count within normal limits, lipase slightly elevated at 58.  He was given 500 mL NS bolus and order for RUQ ultrasound was placed.  He was discharged and advised to follow-up with GI. ? ?Spoke with [name] [name] morning.  He is feeling well.  He plans to go for his ultrasound today around lunchtime.  I have recommended holding his evening dose of diuretics (Lasix 20 and spironolactone 50), continuing only his morning dose (Lasix 40 and spironolactone 100) over the weekend and [place] ? ?I also spoke with DRoosevelt Locks NP [place] liver [place] as LFTs are normal.  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  Could consider biopsy if LFTs rise. ? ?I will follow-up on his [place] week. We will hold off on scheduling EGD and colonoscopy until next week after labs have been updated.   ? ? ?

## 2021-08-16 ENCOUNTER — Telehealth: Payer: Self-pay | Admitting: Gastroenterology

## 2021-08-16 ENCOUNTER — Ambulatory Visit (HOSPITAL_COMMUNITY)
Admission: RE | Admit: 2021-08-16 | Discharge: 2021-08-16 | Disposition: A | Discharge status: Home / Self Care | Payer: Medicaid Other | Source: Ambulatory Visit | Attending: Emergency Medicine | Admitting: Emergency Medicine

## 2021-08-16 DIAGNOSIS — R1011 Right upper quadrant pain: Secondary | ICD-10-CM | POA: Diagnosis present

## 2021-08-16 IMAGING — US US ABDOMEN LIMITED
1 series · 14 of 25 positions shown · non-contrast
Comparison: Abdominal ultrasound [DATE] and abdominal MRI
[DATE]

CLINICAL DATA: Right upper quadrant pain.

EXAM:
ULTRASOUND ABDOMEN LIMITED RIGHT UPPER QUADRANT

[Series 1: us abdomen limited ruq (liver/gb) · 14 of 54 slices shown]
[im 1/54]
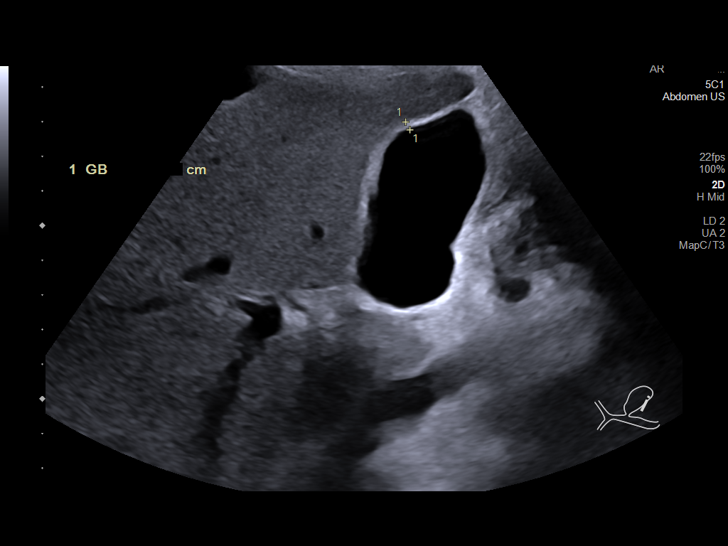
[im 5/54]
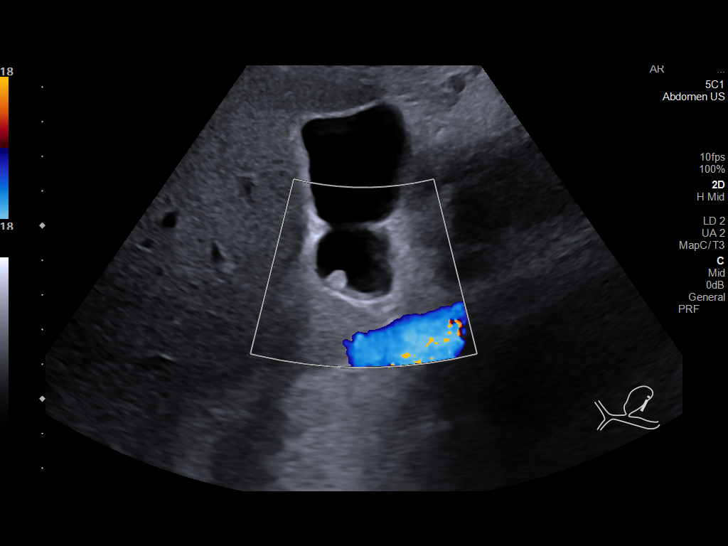
[im 9/54]
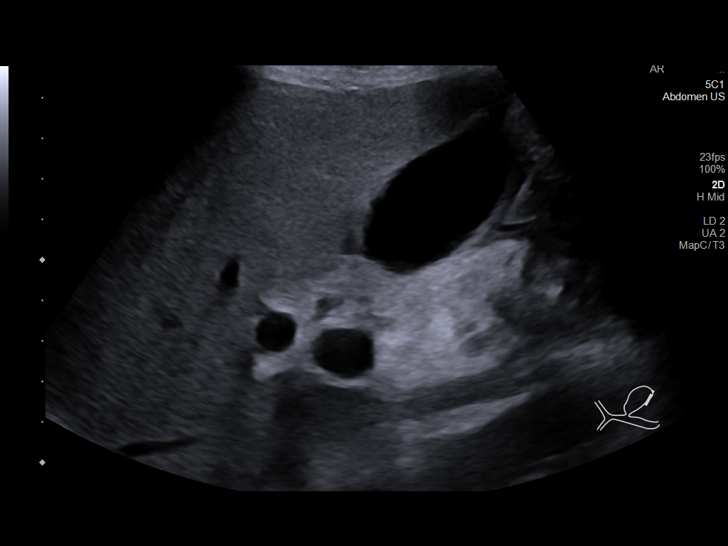
[im 14/54]
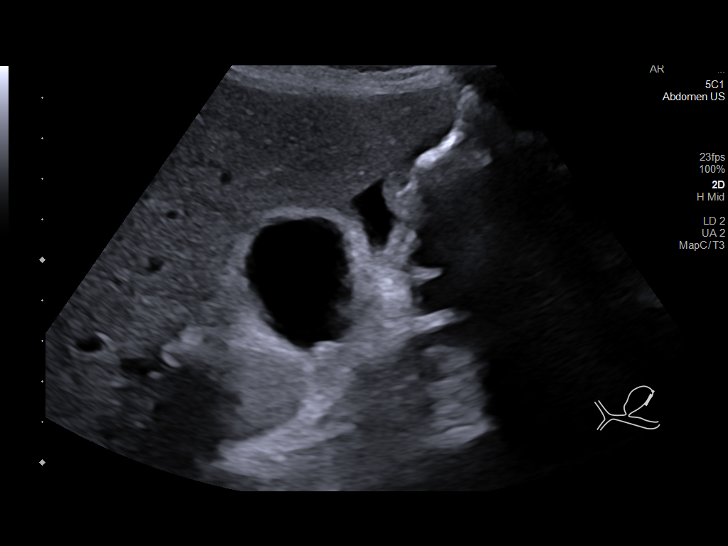
[im 18/54]
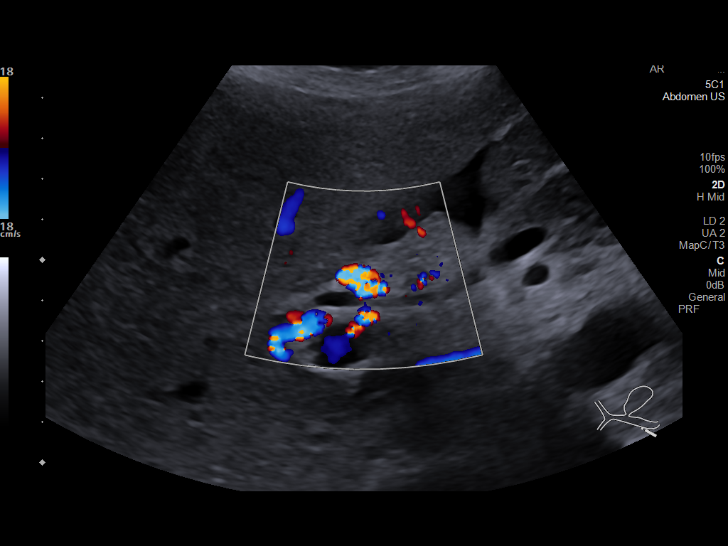
[im 20/54]
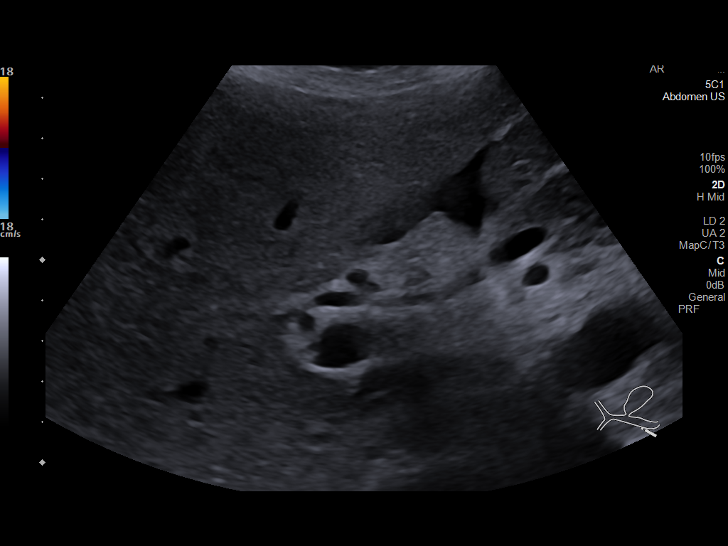
[im 25/54]
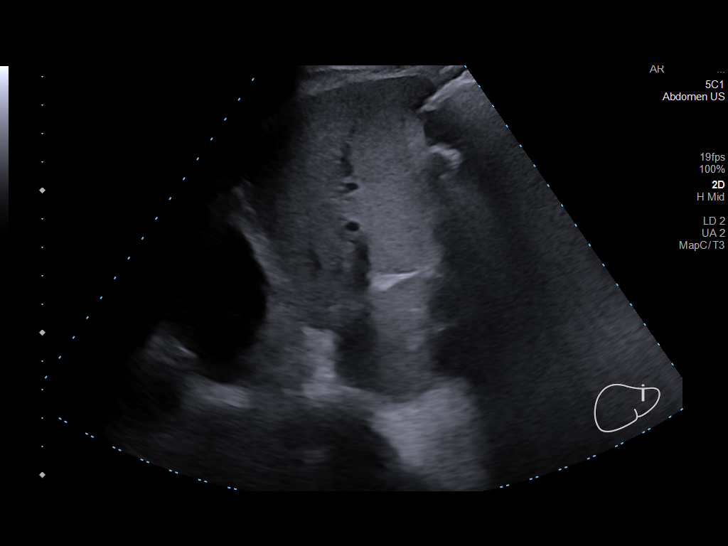
[im 29/54]
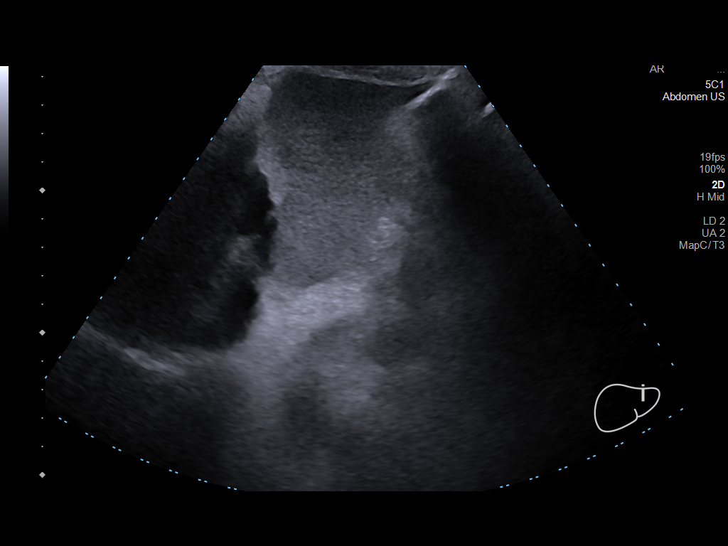
[im 34/54]
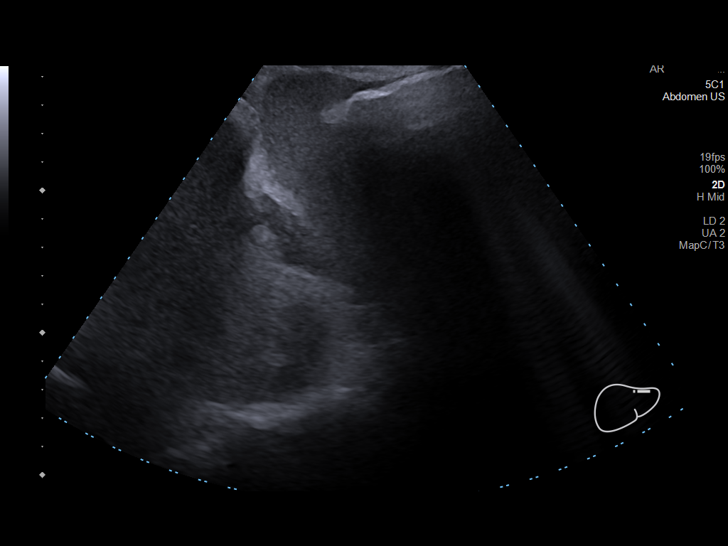
[im 36/54]
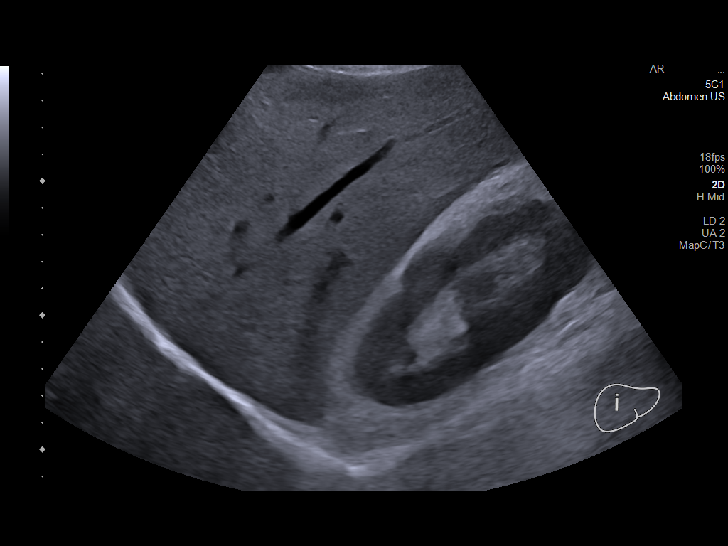
[im 40/54]
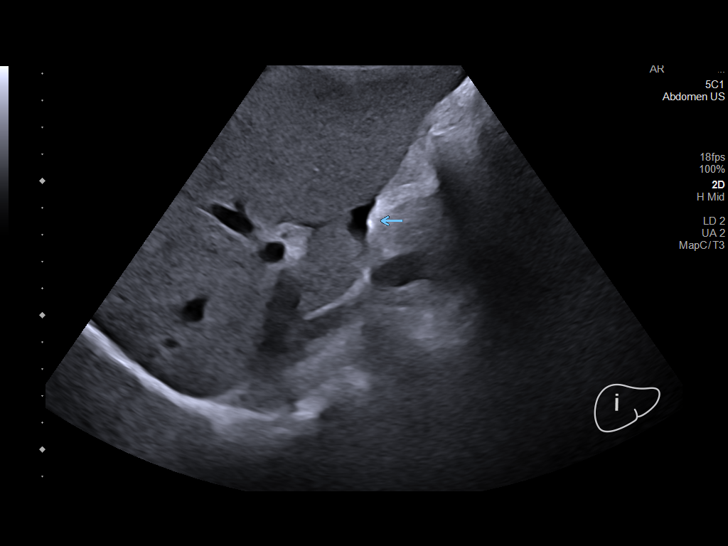
[im 45/54]
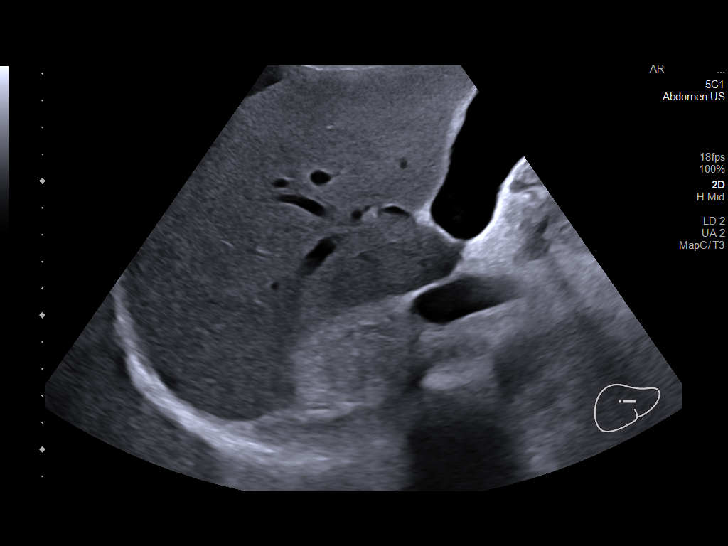
[im 49/54]
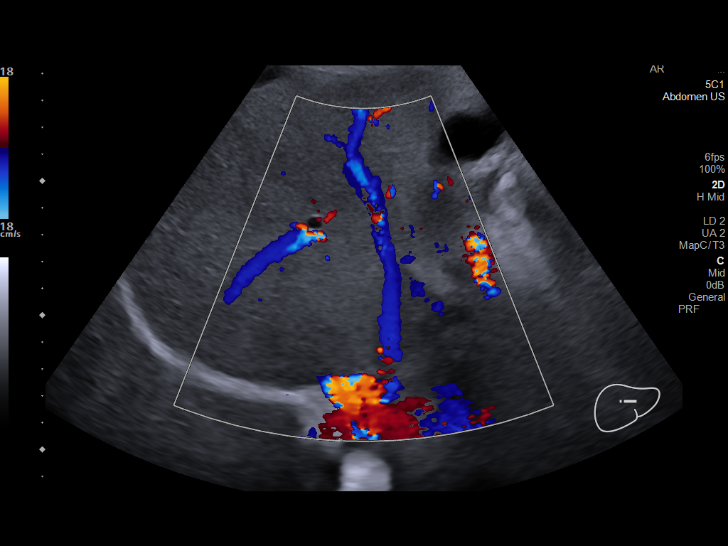
[im 54/54]
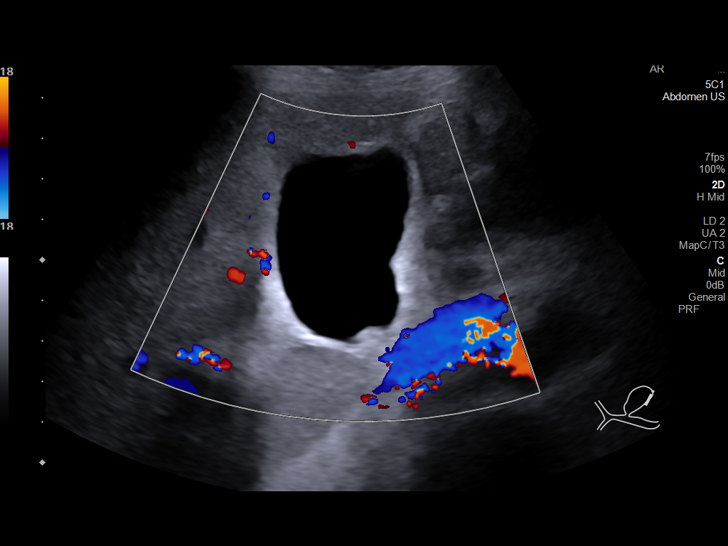

[14 of 25 positions shown; findings below may reference images not displayed]

FINDINGS: Gallbladder:

Mild gallbladder distension. Small non shadowing echogenic structure
in the gallbladder measures 8 mm. This could represent polyp or
stone. Small amount of echogenic sludge in the gallbladder. No
gallbladder wall thickening. Reportedly, the patient does not have a
sonographic Murphy sign.

Common bile duct:

Diameter: 3 mm

Liver:

Slightly increased echogenicity in the liver. Liver parenchyma is
slightly heterogeneous. Minimal perihepatic ascites. Portal vein is
patent on color Doppler imaging with normal direction of blood flow
towards the liver.

Other: Minimal perihepatic ascites.
IMPRESSION: 1. Gallbladder contains a small amount of sludge and there is a
small polyp or stone measuring 8 mm.
2. No biliary dilatation.
3. Minimal perihepatic ascites.
4. Liver is slightly heterogeneous and slightly increased
echogenicity. Please refer to the recent abdominal MRI.

## 2021-08-16 NOTE — Telephone Encounter (Signed)
Patient was to have BMP yesterday at [facility] to follow-up on AKI. Doesn't look like this was completed. Please call patient to remind him to complete BMP today.  ?

## 2021-08-16 NOTE — Telephone Encounter (Signed)
Great, thanks

## 2021-08-16 NOTE — Telephone Encounter (Signed)
Spoke to pt, he is going after his [place] today  ?

## 2021-08-17 ENCOUNTER — Inpatient Hospital Stay (HOSPITAL_COMMUNITY): Payer: Medicaid Other

## 2021-08-17 ENCOUNTER — Inpatient Hospital Stay (HOSPITAL_COMMUNITY): Payer: Medicaid Other | Attending: Physician Assistant | Admitting: Physician Assistant

## 2021-08-17 ENCOUNTER — Other Ambulatory Visit (HOSPITAL_COMMUNITY): Payer: Self-pay | Admitting: Physician Assistant

## 2021-08-17 ENCOUNTER — Other Ambulatory Visit: Payer: Self-pay | Admitting: *Deleted

## 2021-08-17 DIAGNOSIS — D649 Anemia, unspecified: Secondary | ICD-10-CM | POA: Diagnosis not present

## 2021-08-17 DIAGNOSIS — E538 Deficiency of other specified B group vitamins: Secondary | ICD-10-CM | POA: Diagnosis not present

## 2021-08-17 DIAGNOSIS — I10 Essential (primary) hypertension: Secondary | ICD-10-CM | POA: Insufficient documentation

## 2021-08-17 DIAGNOSIS — J449 Chronic obstructive pulmonary disease, unspecified: Secondary | ICD-10-CM | POA: Diagnosis not present

## 2021-08-17 DIAGNOSIS — E559 Vitamin D deficiency, unspecified: Secondary | ICD-10-CM | POA: Diagnosis not present

## 2021-08-17 DIAGNOSIS — Z808 Family history of malignant neoplasm of other organs or systems: Secondary | ICD-10-CM | POA: Diagnosis not present

## 2021-08-17 DIAGNOSIS — R768 Other specified abnormal immunological findings in serum: Secondary | ICD-10-CM | POA: Diagnosis not present

## 2021-08-17 DIAGNOSIS — K7030 Alcoholic cirrhosis of liver without ascites: Secondary | ICD-10-CM | POA: Insufficient documentation

## 2021-08-17 DIAGNOSIS — F17210 Nicotine dependence, cigarettes, uncomplicated: Secondary | ICD-10-CM | POA: Diagnosis not present

## 2021-08-17 DIAGNOSIS — R7989 Other specified abnormal findings of blood chemistry: Secondary | ICD-10-CM

## 2021-08-17 DIAGNOSIS — I82402 Acute embolism and thrombosis of unspecified deep veins of left lower extremity: Secondary | ICD-10-CM | POA: Insufficient documentation

## 2021-08-17 DIAGNOSIS — R634 Abnormal weight loss: Secondary | ICD-10-CM | POA: Insufficient documentation

## 2021-08-17 DIAGNOSIS — K7469 Other cirrhosis of liver: Secondary | ICD-10-CM

## 2021-08-17 LAB — IRON AND TIBC
Iron: 71 ug/dL (ref 45–182)
Saturation Ratios: 24 % (ref 17.9–39.5)
TIBC: 301 ug/dL (ref 250–450)
UIBC: 230 ug/dL

## 2021-08-17 LAB — COMPREHENSIVE METABOLIC PANEL
ALT: 15 U/L (ref 0–44)
AST: 29 U/L (ref 15–41)
Albumin: 3.1 g/dL — ABNORMAL LOW (ref 3.5–5.0)
Alkaline Phosphatase: 97 U/L (ref 38–126)
Anion gap: 8 (ref 5–15)
BUN: 39 mg/dL — ABNORMAL HIGH (ref 6–20)
CO2: 21 mmol/L — ABNORMAL LOW (ref 22–32)
Calcium: 9.4 mg/dL (ref 8.9–10.3)
Chloride: 103 mmol/L (ref 98–111)
Creatinine, Ser: 1.77 mg/dL — ABNORMAL HIGH (ref 0.61–1.24)
GFR, Estimated: 45 mL/min — ABNORMAL LOW (ref >60)
Glucose, Bld: 126 mg/dL — ABNORMAL HIGH (ref 70–99)
Potassium: 4.4 mmol/L (ref 3.5–5.1)
Sodium: 132 mmol/L — ABNORMAL LOW (ref 135–145)
Total Bilirubin: 0.4 mg/dL (ref 0.3–1.2)
Total Protein: 8.4 g/dL — ABNORMAL HIGH (ref 6.5–8.1)

## 2021-08-17 LAB — FOLATE: Folate: 7.7 ng/mL (ref >5.9)

## 2021-08-17 LAB — BASIC METABOLIC PANEL
BUN/Creatinine Ratio: 20 (calc) (ref 6–22)
BUN: 37 mg/dL — ABNORMAL HIGH (ref 7–25)
CO2: 24 mmol/L (ref 20–32)
Calcium: 9.5 mg/dL (ref 8.6–10.3)
Chloride: 100 mmol/L (ref 98–110)
Creat: 1.81 mg/dL — ABNORMAL HIGH (ref 0.70–1.30)
Glucose, Bld: 92 mg/dL (ref 65–99)
Potassium: 4.8 mmol/L (ref 3.5–5.3)
Sodium: 133 mmol/L — ABNORMAL LOW (ref 135–146)

## 2021-08-17 LAB — CBC WITH DIFFERENTIAL/PLATELET
Abs Immature Granulocytes: 0.03 10*3/uL (ref 0.00–0.07)
Basophils Absolute: 0.1 10*3/uL (ref 0.0–0.1)
Basophils Relative: 1 %
Eosinophils Absolute: 0.3 10*3/uL (ref 0.0–0.5)
Eosinophils Relative: 3 %
HCT: 37 % — ABNORMAL LOW (ref 39.0–52.0)
Hemoglobin: 11.9 g/dL — ABNORMAL LOW (ref 13.0–17.0)
Immature Granulocytes: 0 %
Lymphocytes Relative: 21 %
Lymphs Abs: 1.9 10*3/uL (ref 0.7–4.0)
MCH: 27.9 pg (ref 26.0–34.0)
MCHC: 32.2 g/dL (ref 30.0–36.0)
MCV: 86.7 fL (ref 80.0–100.0)
Monocytes Absolute: 1.1 10*3/uL — ABNORMAL HIGH (ref 0.1–1.0)
Monocytes Relative: 12 %
Neutro Abs: 5.8 10*3/uL (ref 1.7–7.7)
Neutrophils Relative %: 63 %
Platelets: 355 10*3/uL (ref 150–400)
RBC: 4.27 MIL/uL (ref 4.22–5.81)
RDW: 14.6 % (ref 11.5–15.5)
WBC: 9.2 10*3/uL (ref 4.0–10.5)
nRBC: 0 % (ref 0.0–0.2)

## 2021-08-17 LAB — VITAMIN B12: Vitamin B-12: 361 pg/mL (ref 180–914)

## 2021-08-17 LAB — FERRITIN: Ferritin: 492 ng/mL — ABNORMAL HIGH (ref 24–336)

## 2021-08-17 LAB — LACTATE DEHYDROGENASE: LDH: 107 U/L (ref 98–192)

## 2021-08-17 NOTE — Progress Notes (Signed)
? ?[facility] ?[address] ?[place], [place] [address] ? ? ?CLINIC:  ?Medical Oncology/Hematology ? ?PCP:  ?Carrolyn Meiers, MD ?[place] ?[place] [address] ?[phone] ? ? ?REASON FOR VISIT:  ?Follow-up for elevated ferritin (H63D heterozygous) and normocytic anemia ?  ?PRIOR THERAPY: None ?  ?CURRENT THERAPY: Surveillance ? ?INTERVAL HISTORY:  ?[name] [name] 56 y.o. male returns for routine follow-up of his elevated ferritin and normocytic anemia.  He was last seen by Tarri Abernethy PA-C on 03/03/2021. ? ?At today's visit, he reports feeling fair.  He reports waxing and waning energy level with some chronic fatigue (energy about 60%).  He denies any abdominal pain, but does report some abdominal distention and tightness.  No abnormal joint pain or skin changes.  He denies any excessive iron intake such as iron supplementation, organ meats, or large amounts of red meat.  He stopped drinking alcohol in February 2022. ? 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  He has bilateral leg swelling, but denies any unilateral leg swelling or other signs or symptoms of current DVT or PE. ? ?He denies any epistaxis, hematemesis, hematochezia, or melena. ? ?He has been losing weight.  He lost about 30 pounds in the past 3 months.  He reports decreased appetite and early satiety secondary to ascites.  He is drinking Ensure or Boost "when he can afford them."  He wonders if some of his weight fluctuation has been due to his large volume paracentesis. ? ?He has 60% energy and 100% appetite.  ? ? ?REVIEW OF SYSTEMS:  ?Review of Systems  ?Constitutional:  Positive for appetite change, fatigue and unexpected weight change. Negative for chills, diaphoresis and fever.  ?HENT:   Negative for lump/mass and nosebleeds.   ?Eyes:  Negative for eye problems.  ?Respiratory:  Positive for cough and shortness of breath. Negative for hemoptysis.    ?Cardiovascular:  Positive for leg swelling. Negative for chest pain and palpitations.  ?Gastrointestinal:  Negative for abdominal pain, blood in stool, constipation, diarrhea, nausea and vomiting.  ?Genitourinary:  Negative for hematuria.   ?Skin: Negative.   ?Neurological:  Positive for numbness. Negative for dizziness, headaches and light-headedness.  ?Hematological:  Does not bruise/bleed easily.   ? ? ?PAST MEDICAL/SURGICAL HISTORY:  ?Past Medical History:  ?Diagnosis Date  ? Asthma   ? Cirrhosis ([place])   ? Dyspnea   ? High cholesterol   ? History of kidney stones   ? Hypertension   ? Kidney stones   ? ?Past Surgical History:  ?Procedure Laterality Date  ? APPENDECTOMY    ? BIOPSY  07/22/2020  ? Procedure: BIOPSY;  Surgeon: Daneil Dolin, MD;  [facility];  Service: Endoscopy;;  ? COLONOSCOPY WITH PROPOFOL N/A 04/18/2017  ? non-bleeding internal hemorrhoids, two 4-6 mm polyps in descending colon and cecum, pancolonic diverticulosis, single cecal AVM. Tubular adenomas, surveillance in 2023.   ? ESOPHAGOGASTRODUODENOSCOPY (EGD) WITH PROPOFOL N/A 07/22/2020  ? normal esophagus, small hiatal hernia, abnormal gastric mucosa, abnormal appearing ampula and periampullary mucosa. Mild chronic gastritis.  ? FRACTURE SURGERY    ? left arm  ? IR PARACENTESIS  05/31/2021  ? LOWER EXTREMITY VENOGRAPHY N/A 08/10/2020  ? Procedure: LOWER EXTREMITY VENOGRAPHY;  Surgeon: Waynetta Sandy, MD;  Location: [facility];  Service: Cardiovascular;  Laterality: N/A;  ? PERIPHERAL VASCULAR BALLOON ANGIOPLASTY Left 08/10/2020  ? Procedure: PERIPHERAL VASCULAR BALLOON ANGIOPLASTY;  Surgeon:  Waynetta Sandy, MD;  Location: [facility];  Service: Cardiovascular;  Laterality: Left;  lower extremity venous  ? PERIPHERAL VASCULAR THROMBECTOMY N/A 08/10/2020  ? Procedure: PERIPHERAL VASCULAR THROMBECTOMY;  Surgeon: Waynetta Sandy, MD;  Location: [facility];  Service: Cardiovascular;   Laterality: N/A;  ? POLYPECTOMY  04/18/2017  ? Procedure: POLYPECTOMY;  Surgeon: Daneil Dolin, MD;  [facility];  Service: Endoscopy;;  ? ? ? ?SOCIAL HISTORY:  ?Social History  ? ?Socioeconomic History  ? Marital status: Single  ?  Spouse name: Not on file  ? Number of children: Not on file  ? Years of education: Not on file  ? Highest education level: Not on file  ?Occupational History  ? Not on file  ?Tobacco Use  ? Smoking status: Every Day  ?  Packs/day: 0.50  ?  Years: 33.00  ?  Pack years: 16.50  ?  Types: Cigarettes  ? Smokeless tobacco: Never  ?Vaping Use  ? Vaping Use: Never used  ?Substance and Sexual Activity  ? Alcohol use: No  ?  Comment: history of ETOH use 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 9s, no ETOH for about 4 weeks as of 07/07/20  ? Drug use: No  ? Sexual activity: Yes  ?  Birth control/protection: None  ?Other Topics Concern  ? Not on file  ?Social History Narrative  ? Not on file  ? ?Social Determinants of Health  ? ?Financial Resource Strain: High Risk  ? Difficulty of Paying Living Expenses: Very hard  ?Food Insecurity: No Food Insecurity  ? Worried About Charity fundraiser in the Last Year: Never true  ? Ran Out of Food in the Last Year: Never true  ?Transportation Needs: No Transportation Needs  ? Lack of Transportation (Medical): No  ? Lack of Transportation (Non-Medical): No  ?Physical Activity: Sufficiently Active  ? Days of Exercise per Week: 7 days  ? Minutes of Exercise per Session: 30 min  ?Stress: No Stress Concern Present  ? Feeling of Stress : Only a little  ?Social Connections: Moderately Isolated  ? Frequency of Communication with Friends and Family: More than three times a week  ? Frequency of Social Gatherings with Friends and Family: Once a week  ? Attends Religious Services: More than 4 times per year  ? Active Member of Clubs or Organizations: No  ? Attends Archivist Meetings: Never  ? Marital Status: Never married  ?Intimate Partner Violence: Not  At Risk  ? Fear of Current or Ex-Partner: No  ? Emotionally Abused: No  ? Physically Abused: No  ? Sexually Abused: No  ? ? ?FAMILY HISTORY:  ?Family History  ?Problem Relation Age of Onset  ? Cancer Father   ?     throat  ? Diabetes Sister   ? Colon cancer Neg Hx   ? ? ?CURRENT MEDICATIONS:  ?Outpatient Encounter Medications as of 08/17/2021  ?Medication Sig  ? acetaminophen (TYLENOL) 500 MG tablet Take 1,000 mg by mouth every 6 (six) hours as needed.  ? albuterol (PROVENTIL HFA) 108 (90 Base) MCG/ACT inhaler INHALE 2 PUFFS BY MOUTH EVERY 6 HOURS AS NEEDED FOR COUGHING, WHEEZING, OR SHORTNESS OF BREATH  ? amLODipine (NORVASC) 10 MG tablet Take 10 mg by mouth daily.  ? aspirin 81 MG chewable tablet Chew 81 mg by mouth daily.  ? atorvastatin (LIPITOR) 20 MG tablet Take 20 mg by mouth daily.  ? furosemide (LASIX) 20 MG tablet Take  2 tablets in morning and 1 tablet in the afternoon. (40 milligrams in morning and 20 milligrams in afternoon).  ? metoprolol tartrate (LOPRESSOR) 25 MG tablet Take 25 mg by mouth 2 (two) times daily.  ? mometasone-formoterol (DULERA) 200-5 MCG/ACT AERO Inhale 2 puffs into the lungs 2 (two) times daily.  ? nicotine (NICODERM CQ - DOSED IN MG/24 HOURS) 14 mg/24hr patch Place 1 patch (14 mg total) onto the skin daily. (Patient not taking: Reported on 08/11/2021)  ? pantoprazole (PROTONIX) 40 MG tablet Take 1 tablet (40 mg total) by mouth daily.  ? spironolactone (ALDACTONE) 50 MG tablet Take 50 mg by mouth 2 (two) times daily.  ? ?No facility-administered encounter medications on file as of 08/17/2021.  ? ? ?ALLERGIES:  ?No Known Allergies ? ? ?PHYSICAL EXAM:  ?ECOG PERFORMANCE STATUS: 1 - Symptomatic but completely ambulatory ? ?There were no vitals filed for this visit. ?There were no vitals filed for this visit. ?Physical Exam ?Constitutional:   ?   Appearance: Normal appearance.  ?HENT:  ?   Head: Normocephalic and atraumatic.  ?   Mouth/Throat:  ?   Mouth: Mucous membranes are moist.  ?Eyes:   ?   Extraocular Movements: Extraocular movements intact.  ?   Pupils: Pupils are equal, round, and reactive to light.  ?Cardiovascular:  ?   Rate and Rhythm: Normal rate and regular rhythm.  ?   Pulses: Normal p

## 2021-08-17 NOTE — Patient Instructions (Signed)
[facility] at [facility] ?Discharge Instructions ? ?You were seen today by Tarri Abernethy PA-C for your follow-up visit.  We are monitoring you for your elevated iron and your anemia.  We checked labs today, and we will see you for follow-up visit to discuss those labs and next steps in 2 to 3 weeks. ? ?OTHER TESTS: Check stool cards x3 ? ?MEDICATIONS: No changes to home medications ? ?FOLLOW-UP APPOINTMENT: Office visit in 2 to 3 weeks to discuss lab results and next steps ? ? ?Thank you for choosing [facility] at [facility] to provide your oncology and hematology care.  To afford each patient quality time with our provider, please arrive at least 15 minutes before your scheduled appointment time.  ? ?If you have a lab appointment with the [facility] please come in thru the Main Entrance and check in at the main information desk. ? ?You need to re-schedule your appointment should you arrive 10 or more minutes late.  We strive to give you quality time with our providers, and arriving late affects you and other patients whose appointments are after yours.  Also, if you no show three or more times for appointments you may be dismissed from the clinic at the providers discretion.     ?Again, thank you for choosing [facility].  Our hope is that these requests will decrease the amount of time that you wait before being seen by our physicians.       ?_____________________________________________________________ ? ?Should you have questions after your visit to [facility], please contact our office at [phone] and follow the prompts.  Our office hours are 8:00 a.m. and 4:30 p.m. Monday - Friday.  Please note that voicemails left after 4:00 p.m. may not be returned until the following business day.  We are closed weekends and major holidays.  You do have access to a nurse 24-7, just call the main number to the clinic [phone] and do  not press any options, hold on the line and a nurse will answer the phone.   ? ?For prescription refill requests, have your pharmacy contact our office and allow 72 hours.   ? ?Due to Covid, you will need to wear a mask upon entering the hospital. If you do not have a mask, a mask will be given to you at the Main Entrance upon arrival. For doctor visits, patients may have 1 support person age 76 or older with them. For treatment visits, patients can not have anyone with them due to social distancing guidelines and our immunocompromised population.  ? ? ? ?

## 2021-08-18 LAB — KAPPA/LAMBDA LIGHT CHAINS
Kappa free light chain: 170.5 mg/L — ABNORMAL HIGH (ref 3.3–19.4)
Kappa, lambda light chain ratio: 1.57 (ref 0.26–1.65)
Lambda free light chains: 108.7 mg/L — ABNORMAL HIGH (ref 5.7–26.3)

## 2021-08-19 LAB — METHYLMALONIC ACID, SERUM: Methylmalonic Acid, Quantitative: 684 nmol/L — ABNORMAL HIGH (ref 0–378)

## 2021-08-19 LAB — PROTEIN ELECTROPHORESIS, SERUM
A/G Ratio: 0.6 — ABNORMAL LOW (ref 0.7–1.7)
Albumin ELP: 3.1 g/dL (ref 2.9–4.4)
Alpha-1-Globulin: 0.3 g/dL (ref 0.0–0.4)
Alpha-2-Globulin: 0.8 g/dL (ref 0.4–1.0)
Beta Globulin: 1.3 g/dL (ref 0.7–1.3)
Gamma Globulin: 2.3 g/dL — ABNORMAL HIGH (ref 0.4–1.8)
Globulin, Total: 4.8 g/dL — ABNORMAL HIGH (ref 2.2–3.9)
Total Protein ELP: 7.9 g/dL (ref 6.0–8.5)

## 2021-08-19 LAB — IMMUNOFIXATION ELECTROPHORESIS
IgA: 769 mg/dL — ABNORMAL HIGH (ref 90–386)
IgG (Immunoglobin G), Serum: 2295 mg/dL — ABNORMAL HIGH (ref 603–1613)
IgM (Immunoglobulin M), Srm: 83 mg/dL (ref 20–172)
Total Protein ELP: 8.2 g/dL (ref 6.0–8.5)

## 2021-08-19 LAB — HOMOCYSTEINE: Homocysteine: 38.7 umol/L — ABNORMAL HIGH (ref 0.0–14.5)

## 2021-08-22 ENCOUNTER — Telehealth: Payer: Self-pay | Admitting: Internal Medicine

## 2021-08-22 ENCOUNTER — Other Ambulatory Visit: Payer: Self-pay | Admitting: *Deleted

## 2021-08-22 DIAGNOSIS — K7469 Other cirrhosis of liver: Secondary | ICD-10-CM

## 2021-08-22 DIAGNOSIS — R1010 Upper abdominal pain, unspecified: Secondary | ICD-10-CM

## 2021-08-22 DIAGNOSIS — R634 Abnormal weight loss: Secondary | ICD-10-CM

## 2021-08-22 NOTE — Telephone Encounter (Signed)
PATIEINT SAID HE WAS RETURNING A CALL FROM Friday   ?

## 2021-08-22 NOTE — Telephone Encounter (Signed)
Reviewed result note.  ?

## 2021-08-22 NOTE — Telephone Encounter (Signed)
See previous note

## 2021-08-24 ENCOUNTER — Ambulatory Visit: Payer: Medicaid Other | Admitting: Gastroenterology

## 2021-08-27 LAB — BASIC METABOLIC PANEL WITH GFR
BUN/Creatinine Ratio: 18 (calc) (ref 6–22)
BUN: 27 mg/dL — ABNORMAL HIGH (ref 7–25)
CO2: 23 mmol/L (ref 20–32)
Calcium: 9.7 mg/dL (ref 8.6–10.3)
Chloride: 99 mmol/L (ref 98–110)
Creat: 1.53 mg/dL — ABNORMAL HIGH (ref 0.70–1.30)
Glucose, Bld: 95 mg/dL (ref 65–99)
Potassium: 4.3 mmol/L (ref 3.5–5.3)
Sodium: 130 mmol/L — ABNORMAL LOW (ref 135–146)
eGFR: 53 mL/min/{1.73_m2} — ABNORMAL LOW (ref >=60)

## 2021-08-29 ENCOUNTER — Other Ambulatory Visit: Payer: Self-pay | Admitting: Gastroenterology

## 2021-08-29 NOTE — Telephone Encounter (Signed)
[name] [name], can you verify that patient is taking Protonix 40 mg daily? ?

## 2021-08-29 NOTE — Telephone Encounter (Signed)
Routing back to [facility] Clinical Pool. Doesn't look like the need to arrange procedures has been addressed. Please note, patient needs EGD and colonoscopy. See original note below for order details.  ?

## 2021-08-29 NOTE — Telephone Encounter (Signed)
Last ov 08/11/2021 ?

## 2021-08-29 NOTE — Telephone Encounter (Signed)
Noted  

## 2021-08-29 NOTE — Telephone Encounter (Signed)
We have orders. We are currently awaiting Dr. Roseanne Kaufman June/July schedule to call and get patient scheduled/ ?

## 2021-08-30 ENCOUNTER — Other Ambulatory Visit: Payer: Self-pay | Admitting: Gastroenterology

## 2021-08-30 NOTE — Telephone Encounter (Signed)
LMOM for pt to call office back 

## 2021-08-30 NOTE — Telephone Encounter (Signed)
Last ov 08/11/21 ?

## 2021-09-01 ENCOUNTER — Telehealth: Payer: Self-pay

## 2021-09-01 ENCOUNTER — Ambulatory Visit (INDEPENDENT_AMBULATORY_CARE_PROVIDER_SITE_OTHER): Payer: Medicaid Other | Admitting: Gastroenterology

## 2021-09-01 ENCOUNTER — Encounter: Payer: Self-pay | Admitting: Gastroenterology

## 2021-09-01 ENCOUNTER — Telehealth: Payer: Self-pay | Admitting: *Deleted

## 2021-09-01 VITALS — BP 110/78 | HR 87 | Temp 98.4°F | Ht 72.0 in | Wt 147.4 lb

## 2021-09-01 DIAGNOSIS — K7030 Alcoholic cirrhosis of liver without ascites: Secondary | ICD-10-CM | POA: Diagnosis not present

## 2021-09-01 MED ORDER — PANTOPRAZOLE SODIUM 40 MG PO TBEC: 40.0000 mg | DELAYED_RELEASE_TABLET | Freq: Every day | ORAL | 3 refills | Status: DC

## 2021-09-01 NOTE — H&P (View-Only) (Signed)
? ? ? ? ? ?Gastroenterology Office Note   ? ? ?Primary Care Physician:  Carrolyn Meiers, MD  ?Primary Gastroenterologist: Dr. Gala Romney  ? ? ?Chief Complaint  ? ?Chief Complaint  ?Patient presents with  ? Follow-up  ?  Still having some tightness in his stomach. Needs refills on his pantoprazole.  ? ? ? ?History of Present Illness  ? ?[name] [name] is a 56 y.o. 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  ? ?Unfortunately, PEth positive April 2023. He admits to drinking in March.  ? ?Difficulty in titrating diuretics in the past due to bumps in creatinine and actually required IV hydration in the ED April 2023. However, he is now tolerating Lasix 40 mg in morning, 20 mg in afternoon and aldactone 100 mg in morning and 50 mg in afternoon. Last para was actually in March 2023. He has not required paras since that time. No lower extremity edema.  ? ?States low salt but eats hot dogs and pork chops. Not adding additional salt. No canned foods. No overt GI bleeding, no mental status changes or confusion. Denies jaundice or pruritis.  ? ?Roosevelt Locks, NP, has recommended early interval EGD due to recent decompensation with need for paras. MELD Na 11 in Feb 2023. MELD  9 in April 2023.  ? ?He is not immune to Hep B. Unknown if immune to Hep A.  ? ? ? ? ?Past Medical History:  ?Diagnosis Date  ? Asthma   ? Cirrhosis ([place])   ? Dyspnea   ? High cholesterol   ? History of kidney stones   ? Hypertension   ? Kidney stones   ? ? ?Past Surgical History:  ?Procedure Laterality Date  ? APPENDECTOMY    ? BIOPSY  07/22/2020  ? Procedure: BIOPSY;  Surgeon: Daneil Dolin, MD;  [facility];  Service: Endoscopy;;  ? COLONOSCOPY WITH PROPOFOL N/A 04/18/2017  ? non-bleeding internal hemorrhoids, two 4-6 mm polyps in descending colon and cecum, pancolonic diverticulosis, single cecal AVM. Tubular adenomas, surveillance in 2023.   ? ESOPHAGOGASTRODUODENOSCOPY (EGD) WITH PROPOFOL N/A 07/22/2020  ? normal esophagus, small hiatal hernia, abnormal gastric mucosa, abnormal appearing ampula and periampullary mucosa. Mild chronic gastritis.  ? FRACTURE SURGERY    ? left arm  ? IR PARACENTESIS  05/31/2021  ? LOWER EXTREMITY VENOGRAPHY N/A 08/10/2020  ? Procedure: LOWER EXTREMITY VENOGRAPHY;  Surgeon: Waynetta Sandy, MD;  Location: [facility];  Service: Cardiovascular;  Laterality: N/A;  ? PERIPHERAL VASCULAR BALLOON ANGIOPLASTY Left 08/10/2020  ? Procedure: PERIPHERAL VASCULAR BALLOON ANGIOPLASTY;  Surgeon: Waynetta Sandy, MD;  Location: [facility];  Service: Cardiovascular;  Laterality: Left;  lower extremity venous  ? PERIPHERAL VASCULAR THROMBECTOMY N/A 08/10/2020  ? Procedure: PERIPHERAL VASCULAR THROMBECTOMY;  Surgeon: Waynetta Sandy, MD;  Location: [facility];  Service: Cardiovascular;  Laterality: N/A;  ? POLYPECTOMY  04/18/2017  ? Procedure: POLYPECTOMY;  Surgeon: Daneil Dolin, MD;  [facility];  Service: Endoscopy;;  ? ? ?Current Outpatient Medications  ?Medication Sig Dispense Refill  ? acetaminophen (TYLENOL) 500 MG tablet Take 1,000 mg by mouth every 6 (six) hours as needed for moderate pain.    ? albuterol (PROVENTIL  HFA) 108 (90 Base) MCG/ACT inhaler INHALE 2 PUFFS BY MOUTH EVERY 6 HOURS AS NEEDED FOR COUGHING, WHEEZING, OR SHORTNESS OF BREATH 18 g 1  ? amLODipine (NORVASC) 10 MG tablet Take 10 mg by mouth daily.    ? aspirin 81 MG chewable tablet Chew 81 mg by mouth daily.    ? atorvastatin (LIPITOR) 20 MG tablet Take 20 mg by mouth daily.    ? furosemide (LASIX) 20 MG tablet Take 2 tablets in morning and 1 tablet in the afternoon. (40 milligrams in morning and 20 milligrams in afternoon). 90 tablet 3  ? metoprolol tartrate (LOPRESSOR) 25 MG tablet Take 25 mg by mouth 2 (two) times daily.    ? mometasone-formoterol (DULERA) 200-5 MCG/ACT AERO Inhale 2 puffs into the lungs 2 (two) times daily. 13 g 3  ? spironolactone (ALDACTONE) 50 MG tablet Take 50-100 mg by mouth See admin instructions. Take 100 mg by mouth in morning and 50 mg in the afternoon    ? nicotine (NICODERM CQ - DOSED IN MG/24 HOURS) 14 mg/24hr patch Place 1 patch (14 mg total) onto the skin daily. (Patient not taking: Reported on 09/01/2021) 14 patch 0  ? pantoprazole (PROTONIX) 40 MG tablet Take 1 tablet (40 mg total) by mouth daily. 90 tablet 3  ? ?No current facility-administered medications for this visit.  ? ? ?Allergies as of 09/01/2021  ? (No Known Allergies)  ? ? ?Family History  ?Problem Relation Age of Onset  ? Cancer Father   ?     throat  ? Diabetes Sister   ? Colon cancer Neg Hx   ? ? ?Social History  ? ?Socioeconomic History  ? Marital status: Single  ?  Spouse name: Not on file  ? Number of children: Not on file  ? Years of education: Not on file  ? Highest education level: Not on file  ?Occupational History  ? Not on file  ?Tobacco Use  ? Smoking status: Every Day  ?  Packs/day: 0.50  ?  Years: 33.00  ?  Pack years: 16.50  ?  Types: Cigarettes  ? Smokeless tobacco: Never  ?Vaping Use  ? Vaping Use: Never used  ?Substance and Sexual Activity  ? Alcohol use: No  ?  Comment: history of ETOH use 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 54s, no ETOH for about 4  weeks as of 07/07/20  ? Drug use: No  ? Sexual activity: Yes  ?  Birth control/protection: None  ?Other Topics Concern  ? Not on file  ?Social History Narrative  ? Not on file  ? ?Social Determinants of Health  ? ?Financial Resource Strain: High Risk  ? Difficulty of Paying Living Expenses: Very hard  ?Food Insecurity: No Food Insecurity  ? Worried About Charity fundraiser in the Last Year: Never true  ? Ran Out of Food in the Last Year: Never true  ?Transportation Needs: No Transportation Needs  ? Lack of Transportation (Medical): No  ? Lack of Transportation (Non-Medical): No  ?Physical Activity: Sufficiently Active  ? Days of Exercise per Week: 7 days  ?  Minutes of Exercise per Session: 30 min  ?Stress: No Stress Concern Present  ? Feeling of Stress : Only a little  ?Social Connections: Moderately Isolated  ? Frequency of Communication with Friends and Family: More than three times a week  ? Frequency of Social Gatherings with Friends and Family: Once a week  ? Attends Religious Services: More than 4 times per year  ? Active Member of Clubs or Organizations: No  ? Attends Archivist Meetings: Never  ? Marital Status: Never married  ?Intimate Partner Violence: Not At Risk  ? Fear of Current or Ex-Partner: No  ? Emotionally Abused: No  ? Physically Abused: No  ? Sexually Abused: No  ? ? ? ?Review of Systems  ? ?Gen: Denies any fever, chills, fatigue, weight loss, lack of appetite.  ?CV: Denies chest pain, heart palpitations, peripheral edema, syncope.  ?Resp: Denies shortness of breath at rest or with exertion. Denies wheezing or cough.  ?GI: see HPI ?GU : Denies urinary burning, urinary frequency, urinary hesitancy ?MS: Denies joint pain, muscle weakness, cramps, or limitation of movement.  ?Derm: Denies rash, itching, dry skin ?Psych: Denies depression, anxiety, memory loss, and confusion ?Heme: Denies bruising, bleeding, and enlarged lymph nodes. ? ? ?Physical Exam  ? ?BP 110/78 (BP Location: Left Arm,  Patient Position: Sitting, Cuff Size: Normal)   Pulse 87   Temp 98.4 ?F (36.9 ?C) (Oral)   Ht 6' (1.829 m)   Wt 147 lb 6.4 oz (66.9 kg)   SpO2 98%   BMI 19.99 kg/m?  ?General:   Alert and oriented.

## 2021-09-01 NOTE — Telephone Encounter (Signed)
See other phone note, spoke to pt. ?

## 2021-09-01 NOTE — Progress Notes (Signed)
? ? ? ? ? ?Gastroenterology Office Note   ? ? ?Primary Care Physician:  Carrolyn Meiers, MD  ?Primary Gastroenterologist: Dr. Gala Romney  ? ? ?Chief Complaint  ? ?Chief Complaint  ?Patient presents with  ? Follow-up  ?  Still having some tightness in his stomach. Needs refills on his pantoprazole.  ? ? ? ?History of Present Illness  ? ?[name] [name] is a 56 y.o. 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  ? ?Unfortunately, PEth positive April 2023. He admits to drinking in March.  ? ?Difficulty in titrating diuretics in the past due to bumps in creatinine and actually required IV hydration in the ED April 2023. However, he is now tolerating Lasix 40 mg in morning, 20 mg in afternoon and aldactone 100 mg in morning and 50 mg in afternoon. Last para was actually in March 2023. He has not required paras since that time. No lower extremity edema.  ? ?States low salt but eats hot dogs and pork chops. Not adding additional salt. No canned foods. No overt GI bleeding, no mental status changes or confusion. Denies jaundice or pruritis.  ? ?Roosevelt Locks, NP, has recommended early interval EGD due to recent decompensation with need for paras. MELD Na 11 in Feb 2023. MELD  9 in April 2023.  ? ?He is not immune to Hep B. Unknown if immune to Hep A.  ? ? ? ? ?Past Medical History:  ?Diagnosis Date  ? Asthma   ? Cirrhosis ([place])   ? Dyspnea   ? High cholesterol   ? History of kidney stones   ? Hypertension   ? Kidney stones   ? ? ?Past Surgical History:  ?Procedure Laterality Date  ? APPENDECTOMY    ? BIOPSY  07/22/2020  ? Procedure: BIOPSY;  Surgeon: Daneil Dolin, MD;  [facility];  Service: Endoscopy;;  ? COLONOSCOPY WITH PROPOFOL N/A 04/18/2017  ? non-bleeding internal hemorrhoids, two 4-6 mm polyps in descending colon and cecum, pancolonic diverticulosis, single cecal AVM. Tubular adenomas, surveillance in 2023.   ? ESOPHAGOGASTRODUODENOSCOPY (EGD) WITH PROPOFOL N/A 07/22/2020  ? normal esophagus, small hiatal hernia, abnormal gastric mucosa, abnormal appearing ampula and periampullary mucosa. Mild chronic gastritis.  ? FRACTURE SURGERY    ? left arm  ? IR PARACENTESIS  05/31/2021  ? LOWER EXTREMITY VENOGRAPHY N/A 08/10/2020  ? Procedure: LOWER EXTREMITY VENOGRAPHY;  Surgeon: Waynetta Sandy, MD;  Location: [facility];  Service: Cardiovascular;  Laterality: N/A;  ? PERIPHERAL VASCULAR BALLOON ANGIOPLASTY Left 08/10/2020  ? Procedure: PERIPHERAL VASCULAR BALLOON ANGIOPLASTY;  Surgeon: Waynetta Sandy, MD;  Location: [facility];  Service: Cardiovascular;  Laterality: Left;  lower extremity venous  ? PERIPHERAL VASCULAR THROMBECTOMY N/A 08/10/2020  ? Procedure: PERIPHERAL VASCULAR THROMBECTOMY;  Surgeon: Waynetta Sandy, MD;  Location: [facility];  Service: Cardiovascular;  Laterality: N/A;  ? POLYPECTOMY  04/18/2017  ? Procedure: POLYPECTOMY;  Surgeon: Daneil Dolin, MD;  [facility];  Service: Endoscopy;;  ? ? ?Current Outpatient Medications  ?Medication Sig Dispense Refill  ? acetaminophen (TYLENOL) 500 MG tablet Take 1,000 mg by mouth every 6 (six) hours as needed for moderate pain.    ? albuterol (PROVENTIL  HFA) 108 (90 Base) MCG/ACT inhaler INHALE 2 PUFFS BY MOUTH EVERY 6 HOURS AS NEEDED FOR COUGHING, WHEEZING, OR SHORTNESS OF BREATH 18 g 1  ? amLODipine (NORVASC) 10 MG tablet Take 10 mg by mouth daily.    ? aspirin 81 MG chewable tablet Chew 81 mg by mouth daily.    ? atorvastatin (LIPITOR) 20 MG tablet Take 20 mg by mouth daily.    ? furosemide (LASIX) 20 MG tablet Take 2 tablets in morning and 1 tablet in the afternoon. (40 milligrams in morning and 20 milligrams in afternoon). 90 tablet 3  ? metoprolol tartrate (LOPRESSOR) 25 MG tablet Take 25 mg by mouth 2 (two) times daily.    ? mometasone-formoterol (DULERA) 200-5 MCG/ACT AERO Inhale 2 puffs into the lungs 2 (two) times daily. 13 g 3  ? spironolactone (ALDACTONE) 50 MG tablet Take 50-100 mg by mouth See admin instructions. Take 100 mg by mouth in morning and 50 mg in the afternoon    ? nicotine (NICODERM CQ - DOSED IN MG/24 HOURS) 14 mg/24hr patch Place 1 patch (14 mg total) onto the skin daily. (Patient not taking: Reported on 09/01/2021) 14 patch 0  ? pantoprazole (PROTONIX) 40 MG tablet Take 1 tablet (40 mg total) by mouth daily. 90 tablet 3  ? ?No current facility-administered medications for this visit.  ? ? ?Allergies as of 09/01/2021  ? (No Known Allergies)  ? ? ?Family History  ?Problem Relation Age of Onset  ? Cancer Father   ?     throat  ? Diabetes Sister   ? Colon cancer Neg Hx   ? ? ?Social History  ? ?Socioeconomic History  ? Marital status: Single  ?  Spouse name: Not on file  ? Number of children: Not on file  ? Years of education: Not on file  ? Highest education level: Not on file  ?Occupational History  ? Not on file  ?Tobacco Use  ? Smoking status: Every Day  ?  Packs/day: 0.50  ?  Years: 33.00  ?  Pack years: 16.50  ?  Types: Cigarettes  ? Smokeless tobacco: Never  ?Vaping Use  ? Vaping Use: Never used  ?Substance and Sexual Activity  ? Alcohol use: No  ?  Comment: history of ETOH use 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 58s, no ETOH for about 4  weeks as of 07/07/20  ? Drug use: No  ? Sexual activity: Yes  ?  Birth control/protection: None  ?Other Topics Concern  ? Not on file  ?Social History Narrative  ? Not on file  ? ?Social Determinants of Health  ? ?Financial Resource Strain: High Risk  ? Difficulty of Paying Living Expenses: Very hard  ?Food Insecurity: No Food Insecurity  ? Worried About Charity fundraiser in the Last Year: Never true  ? Ran Out of Food in the Last Year: Never true  ?Transportation Needs: No Transportation Needs  ? Lack of Transportation (Medical): No  ? Lack of Transportation (Non-Medical): No  ?Physical Activity: Sufficiently Active  ? Days of Exercise per Week: 7 days  ?  Minutes of Exercise per Session: 30 min  ?Stress: No Stress Concern Present  ? Feeling of Stress : Only a little  ?Social Connections: Moderately Isolated  ? Frequency of Communication with Friends and Family: More than three times a week  ? Frequency of Social Gatherings with Friends and Family: Once a week  ? Attends Religious Services: More than 4 times per year  ? Active Member of Clubs or Organizations: No  ? Attends Archivist Meetings: Never  ? Marital Status: Never married  ?Intimate Partner Violence: Not At Risk  ? Fear of Current or Ex-Partner: No  ? Emotionally Abused: No  ? Physically Abused: No  ? Sexually Abused: No  ? ? ? ?Review of Systems  ? ?Gen: Denies any fever, chills, fatigue, weight loss, lack of appetite.  ?CV: Denies chest pain, heart palpitations, peripheral edema, syncope.  ?Resp: Denies shortness of breath at rest or with exertion. Denies wheezing or cough.  ?GI: see HPI ?GU : Denies urinary burning, urinary frequency, urinary hesitancy ?MS: Denies joint pain, muscle weakness, cramps, or limitation of movement.  ?Derm: Denies rash, itching, dry skin ?Psych: Denies depression, anxiety, memory loss, and confusion ?Heme: Denies bruising, bleeding, and enlarged lymph nodes. ? ? ?Physical Exam  ? ?BP 110/78 (BP Location: Left Arm,  Patient Position: Sitting, Cuff Size: Normal)   Pulse 87   Temp 98.4 ?F (36.9 ?C) (Oral)   Ht 6' (1.829 m)   Wt 147 lb 6.4 oz (66.9 kg)   SpO2 98%   BMI 19.99 kg/m?  ?General:   Alert and oriented.

## 2021-09-01 NOTE — Telephone Encounter (Signed)
Called pt, informed him of pre-op appt 09/06/21 at 9:00am. ?

## 2021-09-01 NOTE — Telephone Encounter (Signed)
error 

## 2021-09-01 NOTE — Telephone Encounter (Signed)
Pre-op appt 09/06/21 at 9:00am. Tried to call pt on mobile#, received message call can't be completed at this time. ?

## 2021-09-01 NOTE — Patient Instructions (Signed)
I have refilled Protonix for you! ? ?Continue the fluid pills as you are doing. 2 Lasix and 2 aldactone in morning, and 1 Lasix and 1 Aldactone in afternoon. ? ?Please repeat labs next week so we can make sure your kidneys are doing well. ? ?We are arranging an upper endoscopy in the near future with Dr. Gala Romney! ? ?I will see you in 3 months ? ?I enjoyed seeing you again today! As you know, I value our relationship and want to provide genuine, compassionate, and quality care. I welcome your feedback. If you receive a survey regarding your visit,  I greatly appreciate you taking time to fill this out. See you next time! ? ?Annitta Needs, PhD, ANP-BC ?[facility]  ? ?

## 2021-09-02 NOTE — Telephone Encounter (Signed)
Rx was refilled at [facility] yesterday. Nothing further needed. Madelyn Flavors.  ?

## 2021-09-05 NOTE — Patient Instructions (Signed)
? ? ? ? ? ? ? ? [name] ? 09/05/2021  ?  ? '@PREFPERIOPPHARMACY'$ @ ? ? Your procedure is scheduled on  09/08/2021. ? ? Report to [facility] at  [place]  A.M. ? ? Call this number if you have problems the morning of surgery: ? [phone] ? ? Remember: ? Follow the diet instructions given to you by the office. ?  ? Take these medicines the morning of surgery with A SIP OF WATER  ? ?                          norvasc, metoprolol, protonix. ?  ? ? Do not wear jewelry, make-up or nail polish. ? Do not wear lotions, powders, or perfumes, or deodorant. ? Do not shave 48 hours prior to surgery.  Men may shave face and neck. ? Do not bring valuables to the hospital. ? [facility] is not responsible for any belongings or valuables. ? ?Contacts, dentures or bridgework may not be worn into surgery.  Leave your suitcase in the car.  After surgery it may be brought to your room. ? ?For patients admitted to the hospital, discharge time will be determined by your treatment team. ? ?Patients discharged the day of surgery will not be allowed to drive home and must have someone with them for 24 hours.  ? ? ?Special instructions:   DO NOT smoke tobacco or vape for 24 hours before your procedure. ? ?Please read over the following fact sheets that you were given. ?Anesthesia Post-op Instructions and Care and Recovery After Surgery ?  ? ? ? Upper Endoscopy, Adult, Care After ?This sheet gives you information about how to care for yourself after your procedure. Your health care provider may also give you more specific instructions. If you have problems or questions, contact your health care provider. ?What can I expect after the procedure? ?After the procedure, it is common to have: ?A sore throat. ?Mild stomach pain or discomfort. ?Bloating. ?Nausea. ?Follow these instructions at home: ? ?Follow instructions from your health care provider about what to eat or drink after your procedure. ?Return to your normal activities as told by your  health care provider. Ask your health care provider what activities are safe for you. ?Take over-the-counter and prescription medicines only as told by your health care provider. ?If you were given a sedative during the procedure, it can affect you for several hours. Do not drive or operate machinery until your health care provider says that it is safe. ?Keep all follow-up visits as told by your health care provider. This is important. ?Contact a health care provider if you have: ?A sore throat that lasts longer than one day. ?Trouble swallowing. ?Get help right away if: ?You vomit blood or your vomit looks like coffee grounds. ?You have: ?A fever. ?Bloody, black, or tarry stools. ?A severe sore throat or you cannot swallow. ?Difficulty breathing. ?Severe pain in your chest or abdomen. ?Summary ?After the procedure, it is common to have a sore throat, mild stomach discomfort, bloating, and nausea. ?If you were given a sedative during the procedure, it can affect you for several hours. Do not drive or operate machinery until your health care provider says that it is safe. ?Follow instructions from your health care provider about what to eat or drink after your procedure. ?Return to your normal activities as told by your health care provider. ?This information is not intended to replace advice given  to you by your health care provider. Make sure you discuss any questions you have with your health care provider. ?Document Revised: 02/21/2019 Document Reviewed: 09/17/2017 ?Elsevier Patient Education ? [place]. ?Monitored Anesthesia Care, Care After ?This sheet gives you information about how to care for yourself after your procedure. Your health care provider may also give you more specific instructions. If you have problems or questions, contact your health care provider. ?What can I expect after the procedure? ?After the procedure, it is common to have: ?Tiredness. ?Forgetfulness about what happened after  the procedure. ?Impaired judgment for important decisions. ?Nausea or vomiting. ?Some difficulty with balance. ?Follow these instructions at home: ?For the time period you were told by your health care provider: ? ?  ? ?Rest as needed. ?Do not participate in activities where you could fall or become injured. ?Do not drive or use machinery. ?Do not drink alcohol. ?Do not take sleeping pills or medicines that cause drowsiness. ?Do not make important decisions or sign legal documents. ?Do not take care of children on your own. ?Eating and drinking ?Follow the diet that is recommended by your health care provider. ?Drink enough fluid to keep your urine pale yellow. ?If you vomit: ?Drink water, juice, or soup when you can drink without vomiting. ?Make sure you have little or no nausea before eating solid foods. ?General instructions ?Have a responsible adult stay with you for the time you are told. It is important to have someone help care for you until you are awake and alert. ?Take over-the-counter and prescription medicines only as told by your health care provider. ?If you have sleep apnea, surgery and certain medicines can increase your risk for breathing problems. Follow instructions from your health care provider about wearing your sleep device: ?Anytime you are sleeping, including during daytime naps. ?While taking prescription pain medicines, sleeping medicines, or medicines that make you drowsy. ?Avoid smoking. ?Keep all follow-up visits as told by your health care provider. This is important. ?Contact a health care provider if: ?You keep feeling nauseous or you keep vomiting. ?You feel light-headed. ?You are still sleepy or having trouble with balance after 24 hours. ?You develop a rash. ?You have a fever. ?You have redness or swelling around the IV site. ?Get help right away if: ?You have trouble breathing. ?You have new-onset confusion at home. ?Summary ?For several hours after your procedure, you may feel  tired. You may also be forgetful and have poor judgment. ?Have a responsible adult stay with you for the time you are told. It is important to have someone help care for you until you are awake and alert. ?Rest as told. Do not drive or operate machinery. Do not drink alcohol or take sleeping pills. ?Get help right away if you have trouble breathing, or if you suddenly become confused. ?This information is not intended to replace advice given to you by your health care provider. Make sure you discuss any questions you have with your health care provider. ?Document Revised: 03/22/2021 Document Reviewed: 03/20/2019 ?Elsevier Patient Education ? [place]. ? ?

## 2021-09-06 ENCOUNTER — Encounter (HOSPITAL_COMMUNITY)
Admission: RE | Admit: 2021-09-06 | Discharge: 2021-09-06 | Disposition: A | Discharge status: Home / Self Care | Payer: Medicaid Other | Source: Ambulatory Visit | Attending: Internal Medicine | Admitting: Internal Medicine

## 2021-09-06 NOTE — Progress Notes (Signed)
? ?[facility] ?[address] ?[place], [place] [address] ? ? ?CLINIC:  ?Medical Oncology/Hematology ? ?PCP:  ?Carrolyn Meiers, MD ?[facility] ?[place] [address] ?[phone] ? ? ?REASON FOR VISIT:  ?Follow-up for elevated ferritin (H63D heterozygous) and normocytic anemia ?  ?PRIOR THERAPY: None ?  ?CURRENT THERAPY: Surveillance ?  ?INTERVAL HISTORY:  ?[name] [name] 56 y.o. male returns for routine follow-up of his elevated ferritin and normocytic anemia.  He was last seen by Tarri Abernethy PA-C on 08/17/2021. ?  ?At today's visit, he reports feeling about the same. ?He reports waxing and waning energy level with some chronic fatigue (energy about 75%). ?He denies any abdominal pain, but does report some abdominal distention and tightness. ?No abnormal joint pain or skin changes. ?He denies any excessive iron intake such as iron supplementation, organ meats, or large amounts of red meat. ?He stopped drinking alcohol in February 2022, but admits that he relapsed and drank some alcohol in March 2023.   ? ? 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  He has bilateral leg swelling, but denies any unilateral leg swelling or other signs or symptoms of current DVT or PE. ?He denies any epistaxis, hematemesis, hematochezia, or melena. ?  ?He lost about 30 pounds in the past 3 months, but his weight has been stable since his last office visit.  He reports decreased appetite and early satiety secondary to ascites.  He is drinking Ensure or Boost "when he can afford them."  He wonders if some of his weight fluctuation has been due to his large volume paracentesis. ? ?He has 75% energy and 100% appetite. He endorses that he is maintaining a stable weight. ? ? ? ?REVIEW OF SYSTEMS:  ?Review of Systems  ?Constitutional:  Negative for appetite change, chills, diaphoresis, fatigue, fever and unexpected weight change.  ?HENT:   Negative for lump/mass  and nosebleeds.   ?Eyes:  Negative for eye problems.  ?Respiratory:  Negative for cough, hemoptysis and shortness of breath.   ?Cardiovascular:  Negative for chest pain, leg swelling and palpitations.  ?Gastrointestinal:  Positive for abdominal distention. Negative for abdominal pain, blood in stool, constipation, diarrhea, nausea and vomiting.  ?Genitourinary:  Negative for hematuria.   ?Skin: Negative.   ?Neurological:  Positive for numbness. Negative for dizziness, headaches and light-headedness.  ?Hematological:  Does not bruise/bleed easily.   ? ? ?PAST MEDICAL/SURGICAL HISTORY:  ?Past Medical History:  ?Diagnosis Date  ? Asthma   ? Cirrhosis ([place])   ? Dyspnea   ? High cholesterol   ? History of kidney stones   ? Hypertension   ? Kidney stones   ? ?Past Surgical History:  ?Procedure Laterality Date  ? APPENDECTOMY    ? BIOPSY  07/22/2020  ? Procedure: BIOPSY;  Surgeon: Daneil Dolin, MD;  [facility];  Service: Endoscopy;;  ? COLONOSCOPY WITH PROPOFOL N/A 04/18/2017  ? non-bleeding internal hemorrhoids, two 4-6 mm polyps in descending colon and cecum, pancolonic diverticulosis, single cecal AVM. Tubular adenomas, surveillance in 2023.   ? ESOPHAGOGASTRODUODENOSCOPY (EGD) WITH PROPOFOL N/A 07/22/2020  ? normal esophagus, small hiatal hernia, abnormal gastric mucosa, abnormal appearing ampula and periampullary mucosa. Mild chronic gastritis.  ? FRACTURE SURGERY    ? left arm  ? IR PARACENTESIS  05/31/2021  ? LOWER EXTREMITY VENOGRAPHY N/A 08/10/2020  ? Procedure: LOWER EXTREMITY VENOGRAPHY;  Surgeon: Waynetta Sandy, MD;  Location: [facility];  Service: Cardiovascular;  Laterality: N/A;  ? PERIPHERAL VASCULAR BALLOON ANGIOPLASTY Left 08/10/2020  ? Procedure: PERIPHERAL VASCULAR BALLOON ANGIOPLASTY;  Surgeon: Waynetta Sandy, MD;  Location: [facility];  Service: Cardiovascular;  Laterality: Left;  lower extremity venous  ? PERIPHERAL VASCULAR THROMBECTOMY N/A 08/10/2020   ? Procedure: PERIPHERAL VASCULAR THROMBECTOMY;  Surgeon: Waynetta Sandy, MD;  Location: [facility];  Service: Cardiovascular;  Laterality: N/A;  ? POLYPECTOMY  04/18/2017  ? Procedure: POLYPECTOMY;  Surgeon: Daneil Dolin, MD;  [facility];  Service: Endoscopy;;  ? ? ? ?SOCIAL HISTORY:  ?Social History  ? ?Socioeconomic History  ? Marital status: Single  ?  Spouse name: Not on file  ? Number of children: Not on file  ? Years of education: Not on file  ? Highest education level: Not on file  ?Occupational History  ? Not on file  ?Tobacco Use  ? Smoking status: Every Day  ?  Packs/day: 0.50  ?  Years: 33.00  ?  Pack years: 16.50  ?  Types: Cigarettes  ? Smokeless tobacco: Never  ?Vaping Use  ? Vaping Use: Never used  ?Substance and Sexual Activity  ? Alcohol use: No  ?  Comment: history of ETOH use 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 24s, no ETOH for about 4 weeks as of 07/07/20  ? Drug use: No  ? Sexual activity: Yes  ?  Birth control/protection: None  ?Other Topics Concern  ? Not on file  ?Social History Narrative  ? Not on file  ? ?Social Determinants of Health  ? ?Financial Resource Strain: High Risk  ? Difficulty of Paying Living Expenses: Very hard  ?Food Insecurity: No Food Insecurity  ? Worried About Charity fundraiser in the Last Year: Never true  ? Ran Out of Food in the Last Year: Never true  ?Transportation Needs: No Transportation Needs  ? Lack of Transportation (Medical): No  ? Lack of Transportation (Non-Medical): No  ?Physical Activity: Sufficiently Active  ? Days of Exercise per Week: 7 days  ? Minutes of Exercise per Session: 30 min  ?Stress: No Stress Concern Present  ? Feeling of Stress : Only a little  ?Social Connections: Moderately Isolated  ? Frequency of Communication with Friends and Family: More than three times a week  ? Frequency of Social Gatherings with Friends and Family: Once a week  ? Attends Religious Services: More than 4 times per year  ? Active  Member of Clubs or Organizations: No  ? Attends Archivist Meetings: Never  ? Marital Status: Never married  ?Intimate Partner Violence: Not At Risk  ? Fear of Current or Ex-Partner: No  ? Emotionally Abused: No  ? Physically Abused: No  ? Sexually Abused: No  ? ? ?FAMILY HISTORY:  ?Family History  ?Problem Relation Age of Onset  ? Cancer Father   ?     throat  ? Diabetes Sister   ? Colon cancer Neg Hx   ? ? ?CURRENT MEDICATIONS:  ?Outpatient Encounter Medications as of 09/07/2021  ?Medication Sig  ? acetaminophen (TYLENOL) 500 MG tablet Take 1,000 mg by mouth every 6 (six) hours as needed for moderate pain.  ? albuterol (PROVENTIL HFA) 108 (90 Base) MCG/ACT inhaler INHALE 2 PUFFS BY MOUTH EVERY 6 HOURS AS NEEDED FOR COUGHING, WHEEZING, OR SHORTNESS OF BREATH  ? amLODipine (NORVASC) 10 MG tablet Take 10 mg by mouth daily.  ? aspirin 81 MG chewable  tablet Chew 81 mg by mouth daily.  ? atorvastatin (LIPITOR) 20 MG tablet Take 20 mg by mouth daily.  ? furosemide (LASIX) 20 MG tablet Take 2 tablets in morning and 1 tablet in the afternoon. (40 milligrams in morning and 20 milligrams in afternoon).  ? metoprolol tartrate (LOPRESSOR) 25 MG tablet Take 25 mg by mouth 2 (two) times daily.  ? mometasone-formoterol (DULERA) 200-5 MCG/ACT AERO Inhale 2 puffs into the lungs 2 (two) times daily.  ? nicotine (NICODERM CQ - DOSED IN MG/24 HOURS) 14 mg/24hr patch Place 1 patch (14 mg total) onto the skin daily. (Patient not taking: Reported on 09/01/2021)  ? pantoprazole (PROTONIX) 40 MG tablet Take 1 tablet (40 mg total) by mouth daily.  ? spironolactone (ALDACTONE) 50 MG tablet Take 50-100 mg by mouth See admin instructions. Take 100 mg by mouth in morning and 50 mg in the afternoon  ? ?No facility-administered encounter medications on file as of 09/07/2021.  ? ? ?ALLERGIES:  ?No Known Allergies ? ? ?PHYSICAL EXAM:  ?ECOG PERFORMANCE STATUS: 1 - Symptomatic but completely ambulatory ? ?There were no vitals filed for this  visit. ?There were no vitals filed for this visit. ?Physical Exam ?Constitutional:   ?   Appearance: Normal appearance.  ?HENT:  ?   Head: Normocephalic and atraumatic.  ?   Mouth/Throat:  ?   Mouth: Mucous

## 2021-09-06 NOTE — Telephone Encounter (Signed)
Noted  

## 2021-09-07 ENCOUNTER — Inpatient Hospital Stay (HOSPITAL_COMMUNITY): Payer: Medicaid Other | Attending: Hematology | Admitting: Physician Assistant

## 2021-09-07 VITALS — BP 124/72 | HR 67 | Temp 98.6°F | Resp 18 | Ht 72.0 in | Wt 144.8 lb

## 2021-09-07 DIAGNOSIS — Z7901 Long term (current) use of anticoagulants: Secondary | ICD-10-CM | POA: Diagnosis not present

## 2021-09-07 DIAGNOSIS — Z808 Family history of malignant neoplasm of other organs or systems: Secondary | ICD-10-CM | POA: Diagnosis not present

## 2021-09-07 DIAGNOSIS — N1832 Chronic kidney disease, stage 3b: Secondary | ICD-10-CM | POA: Insufficient documentation

## 2021-09-07 DIAGNOSIS — I129 Hypertensive chronic kidney disease with stage 1 through stage 4 chronic kidney disease, or unspecified chronic kidney disease: Secondary | ICD-10-CM | POA: Diagnosis not present

## 2021-09-07 DIAGNOSIS — I82492 Acute embolism and thrombosis of other specified deep vein of left lower extremity: Secondary | ICD-10-CM | POA: Diagnosis not present

## 2021-09-07 DIAGNOSIS — E538 Deficiency of other specified B group vitamins: Secondary | ICD-10-CM | POA: Diagnosis not present

## 2021-09-07 DIAGNOSIS — K7030 Alcoholic cirrhosis of liver without ascites: Secondary | ICD-10-CM | POA: Insufficient documentation

## 2021-09-07 DIAGNOSIS — R634 Abnormal weight loss: Secondary | ICD-10-CM | POA: Diagnosis not present

## 2021-09-07 DIAGNOSIS — D649 Anemia, unspecified: Secondary | ICD-10-CM | POA: Diagnosis not present

## 2021-09-07 DIAGNOSIS — Z86718 Personal history of other venous thrombosis and embolism: Secondary | ICD-10-CM | POA: Diagnosis not present

## 2021-09-07 DIAGNOSIS — F17210 Nicotine dependence, cigarettes, uncomplicated: Secondary | ICD-10-CM | POA: Diagnosis not present

## 2021-09-07 DIAGNOSIS — D631 Anemia in chronic kidney disease: Secondary | ICD-10-CM | POA: Diagnosis not present

## 2021-09-07 DIAGNOSIS — R7989 Other specified abnormal findings of blood chemistry: Secondary | ICD-10-CM | POA: Insufficient documentation

## 2021-09-07 LAB — BASIC METABOLIC PANEL
BUN/Creatinine Ratio: 13 (calc) (ref 6–22)
BUN: 19 mg/dL (ref 7–25)
CO2: 22 mmol/L (ref 20–32)
Calcium: 9.4 mg/dL (ref 8.6–10.3)
Chloride: 104 mmol/L (ref 98–110)
Creat: 1.48 mg/dL — ABNORMAL HIGH (ref 0.70–1.30)
Glucose, Bld: 104 mg/dL — ABNORMAL HIGH (ref 65–99)
Potassium: 4.3 mmol/L (ref 3.5–5.3)
Sodium: 135 mmol/L (ref 135–146)

## 2021-09-07 MED ORDER — CYANOCOBALAMIN 500 MCG PO TABS: 500.0000 ug | ORAL_TABLET | Freq: Every day | ORAL | 0 refills | Status: DC

## 2021-09-07 MED ORDER — FOLIC ACID 400 MCG PO TABS: 400.0000 ug | ORAL_TABLET | Freq: Every day | ORAL | 3 refills | Status: DC

## 2021-09-07 NOTE — Patient Instructions (Signed)
[place] at [facility] ?Discharge Instructions ? ?You were seen today by Tarri Abernethy PA-C for your anemia and your high iron levels. ? ?Your anemia is looking better! ? ?Your high iron is from your liver diease.  Continue to see your GI doctor and your liver doctor. ? ?You have some low vitamins and minerals.  Start taking... ?Vitamin B12 500 mcg daily ?Folic acid 381 mcg daily   ? ?FOLLOW-UP APPOINTMENT: Repeat labs in 6 months with office visit the week after labs ? ? ?Thank you for choosing [place] at [facility] to provide your oncology and hematology care.  To afford each patient quality time with our provider, please arrive at least 15 minutes before your scheduled appointment time.  ? ?If you have a lab appointment with the [facility] please come in thru the Main Entrance and check in at the main information desk. ? ?You need to re-schedule your appointment should you arrive 10 or more minutes late.  We strive to give you quality time with our providers, and arriving late affects you and other patients whose appointments are after yours.  Also, if you no show three or more times for appointments you may be dismissed from the clinic at the providers discretion.     ?Again, thank you for choosing [facility].  Our hope is that these requests will decrease the amount of time that you wait before being seen by our physicians.       ?_____________________________________________________________ ? ?Should you have questions after your visit to [facility], please contact our office at [phone] and follow the prompts.  Our office hours are 8:00 a.m. and 4:30 p.m. Monday - Friday.  Please note that voicemails left after 4:00 p.m. may not be returned until the following business day.  We are closed weekends and major holidays.  You do have access to a nurse 24-7, just call the main number to the clinic [phone] and do not  press any options, hold on the line and a nurse will answer the phone.   ? ?For prescription refill requests, have your pharmacy contact our office and allow 72 hours.   ? ?Due to Covid, you will need to wear a mask upon entering the hospital. If you do not have a mask, a mask will be given to you at the Main Entrance upon arrival. For doctor visits, patients may have 1 support person age 44 or older with them. For treatment visits, patients can not have anyone with them due to social distancing guidelines and our immunocompromised population.  ? ? ? ?

## 2021-09-08 ENCOUNTER — Ambulatory Visit (HOSPITAL_COMMUNITY): Payer: Medicaid Other | Admitting: Anesthesiology

## 2021-09-08 ENCOUNTER — Encounter (HOSPITAL_COMMUNITY): Payer: Self-pay | Admitting: Internal Medicine

## 2021-09-08 ENCOUNTER — Ambulatory Visit (HOSPITAL_BASED_OUTPATIENT_CLINIC_OR_DEPARTMENT_OTHER): Payer: Medicaid Other | Admitting: Anesthesiology

## 2021-09-08 ENCOUNTER — Encounter (HOSPITAL_COMMUNITY)
Admission: RE | Disposition: A | Discharge status: Home / Self Care | Payer: Self-pay | Source: Ambulatory Visit | Attending: Internal Medicine

## 2021-09-08 ENCOUNTER — Ambulatory Visit (HOSPITAL_COMMUNITY)
Admission: RE | Admit: 2021-09-08 | Discharge: 2021-09-08 | Disposition: A | Discharge status: Home / Self Care | Payer: Medicaid Other | Source: Ambulatory Visit | Attending: Internal Medicine | Admitting: Internal Medicine

## 2021-09-08 DIAGNOSIS — I10 Essential (primary) hypertension: Secondary | ICD-10-CM | POA: Diagnosis not present

## 2021-09-08 DIAGNOSIS — Z79899 Other long term (current) drug therapy: Secondary | ICD-10-CM | POA: Insufficient documentation

## 2021-09-08 DIAGNOSIS — R1013 Epigastric pain: Secondary | ICD-10-CM | POA: Insufficient documentation

## 2021-09-08 DIAGNOSIS — K7030 Alcoholic cirrhosis of liver without ascites: Secondary | ICD-10-CM | POA: Diagnosis not present

## 2021-09-08 DIAGNOSIS — F17210 Nicotine dependence, cigarettes, uncomplicated: Secondary | ICD-10-CM | POA: Insufficient documentation

## 2021-09-08 DIAGNOSIS — D649 Anemia, unspecified: Secondary | ICD-10-CM | POA: Diagnosis not present

## 2021-09-08 DIAGNOSIS — I8510 Secondary esophageal varices without bleeding: Secondary | ICD-10-CM | POA: Diagnosis not present

## 2021-09-08 DIAGNOSIS — J449 Chronic obstructive pulmonary disease, unspecified: Secondary | ICD-10-CM | POA: Diagnosis not present

## 2021-09-08 DIAGNOSIS — Z86010 Personal history of colonic polyps: Secondary | ICD-10-CM | POA: Insufficient documentation

## 2021-09-08 HISTORY — PX: ESOPHAGOGASTRODUODENOSCOPY (EGD) WITH PROPOFOL: SHX5813

## 2021-09-08 SURGERY — ESOPHAGOGASTRODUODENOSCOPY (EGD) WITH PROPOFOL
Anesthesia: General

## 2021-09-08 MED ORDER — PROPOFOL 500 MG/50ML IV EMUL
INTRAVENOUS | Status: DC | PRN
  Administered 2021-09-08: 180 ug/kg/min via INTRAVENOUS

## 2021-09-08 MED ORDER — PROPOFOL 10 MG/ML IV BOLUS
INTRAVENOUS | Status: DC | PRN
  Administered 2021-09-08: 80 mg via INTRAVENOUS

## 2021-09-08 MED ORDER — PROPOFOL 500 MG/50ML IV EMUL
INTRAVENOUS | Status: AC
  Filled 2021-09-08: qty 50

## 2021-09-08 MED ORDER — LIDOCAINE HCL (CARDIAC) PF 100 MG/5ML IV SOSY
PREFILLED_SYRINGE | INTRAVENOUS | Status: DC | PRN
  Administered 2021-09-08: 50 mg via INTRAVENOUS

## 2021-09-08 MED ORDER — LACTATED RINGERS IV SOLN
INTRAVENOUS | Status: DC | PRN
Start: 2021-09-08 — End: 2021-09-08

## 2021-09-08 MED ORDER — ETOMIDATE 2 MG/ML IV SOLN
INTRAVENOUS | Status: DC | PRN
  Administered 2021-09-08: 6 mg via INTRAVENOUS

## 2021-09-08 NOTE — Anesthesia Preprocedure Evaluation (Addendum)
Anesthesia Evaluation  ?Patient identified by MRN, date of birth, ID band ?Patient awake ? ? ? ?Reviewed: ?Allergy & Precautions, H&P , NPO status , Patient's Chart, lab work & pertinent test results, reviewed documented beta blocker date and time  ? ?Airway ?Mallampati: II ? ?TM Distance: >3 FB ?Neck ROM: full ? ? ? Dental ?no notable dental hx. ?(+) Missing ?  ?Pulmonary ?neg pulmonary ROS, asthma , COPD, Current Smoker and Patient abstained from smoking.,  ?  ?Pulmonary exam normal ?breath sounds clear to auscultation ? ? ? ? ? ? Cardiovascular ?Exercise Tolerance: Good ?hypertension, negative cardio ROS ? ? ?Rhythm:regular Rate:Normal ? ? ?  ?Neuro/Psych ?PSYCHIATRIC DISORDERS negative neurological ROS ? negative psych ROS  ? GI/Hepatic ?negative GI ROS, Neg liver ROS, (+) Cirrhosis  ? Esophageal Varices ?  ? ,   ?Endo/Other  ?negative endocrine ROS ? Renal/GU ?negative Renal ROS  ?negative genitourinary ?  ?Musculoskeletal ? ? Abdominal ?  ?Peds ? Hematology ?negative hematology ROS ?(+) Blood dyscrasia, anemia ,   ?Anesthesia Other Findings ? ? Reproductive/Obstetrics ?negative OB ROS ? ?  ? ? ? ? ? ? ? ? ? ? ? ? ? ?  ?  ? ? ? ? ? ? ? ?Anesthesia Physical ?Anesthesia Plan ? ?ASA: 3 ? ?Anesthesia Plan: General  ? ?Post-op Pain Management:   ? ?Induction:  ? ?PONV Risk Score and Plan: Propofol infusion ? ?Airway Management Planned:  ? ?Additional Equipment:  ? ?Intra-op Plan:  ? ?Post-operative Plan:  ? ?Informed Consent: I have reviewed the patients History and Physical, chart, labs and discussed the procedure including the risks, benefits and alternatives for the proposed anesthesia with the patient or authorized representative who has indicated his/her understanding and acceptance.  ? ? ? ?Dental Advisory Given ? ?Plan Discussed with: CRNA ? ?Anesthesia Plan Comments:   ? ? ? ? ? ? ?Anesthesia Quick Evaluation ? ?

## 2021-09-08 NOTE — Interval H&P Note (Signed)
History and Physical Interval Note: ? ?09/08/2021 ?9:24 AM ? ?[name] [name]  has presented today for surgery, with the diagnosis of variceal screening, history of cirrhosis.  The various methods of treatment have been discussed with the patient and family. After consideration of risks, benefits and other options for treatment, the patient has consented to  Procedure(s) with comments: ?ESOPHAGOGASTRODUODENOSCOPY (EGD) WITH PROPOFOL (N/A) - 9:15am as a surgical intervention.  The patient's history has been reviewed, patient examined, no change in status, stable for surgery.  I have reviewed the patient's chart and labs.  Questions were answered to the patient's satisfaction.   ? ? ?[name] [name] ? ?Patient seen and examined.  No change.   mild abdominal discomfort.  No dysphagia.  EGD today per plan. ?The risks, benefits, limitations, alternatives and imponderables have been reviewed with the patient. Potential for esophageal dilation, biopsy, etc. have also been reviewed.  Questions have been answered. All parties agreeable.  ?

## 2021-09-08 NOTE — Anesthesia Postprocedure Evaluation (Signed)
Anesthesia Post Note ? ?Patient: JUSTUS DROKE ? ?Procedure(s) Performed: ESOPHAGOGASTRODUODENOSCOPY (EGD) WITH PROPOFOL ? ?Patient location during evaluation: Phase II ?Anesthesia Type: General ?Level of consciousness: awake ?Pain management: pain level controlled ?Vital Signs Assessment: post-procedure vital signs reviewed and stable ?Respiratory status: spontaneous breathing and respiratory function stable ?Cardiovascular status: blood pressure returned to baseline and stable ?Postop Assessment: no headache and no apparent nausea or vomiting ?Anesthetic complications: no ?Comments: Late entry ? ? ?No notable events documented. ? ? ?Last Vitals:  ?Vitals:  ? 09/08/21 0800 09/08/21 0937  ?BP: 129/78 105/68  ?Pulse:  77  ?Resp: 19 18  ?Temp: 36.7 ?C   ?SpO2: 100% 100%  ?  ?Last Pain:  ?Vitals:  ? 09/08/21 0937  ?TempSrc: Axillary  ?PainSc: Asleep  ? ? ?  ?  ?  ?  ?  ?  ? ?Louann Sjogren ? ? ? ? ?

## 2021-09-08 NOTE — Transfer of Care (Signed)
Immediate Anesthesia Transfer of Care Note ? ?Patient: [name] [name] ? ?Procedure(s) Performed: ESOPHAGOGASTRODUODENOSCOPY (EGD) WITH PROPOFOL ? ?Patient Location: PACU ? ?Anesthesia Type:General ? ?Level of Consciousness: awake, alert  and oriented ? ?Airway & Oxygen Therapy: Patient Spontanous Breathing ? ?Post-op Assessment: Report given to RN, Post -op Vital signs reviewed and stable, Patient moving all extremities X 4 and Patient able to stick tongue midline ? ?Post vital signs: Reviewed ? ?Last Vitals:  ?Vitals Value Taken Time  ?BP 105/68 09/08/21 0937  ?Temp 97.8   ?Pulse 77 09/08/21 0937  ?Resp 18 09/08/21 0937  ?SpO2 100 % 09/08/21 0937  ? ? ?Last Pain:  ?Vitals:  ? 09/08/21 0937  ?TempSrc: Axillary  ?PainSc: Asleep  ?   ? ?  ? ?Complications: No notable events documented. ?

## 2021-09-08 NOTE — Discharge Instructions (Signed)
EGD ?Discharge instructions ?Please read the instructions outlined below and refer to this sheet in the next few weeks. These discharge instructions provide you with general information on caring for yourself after you leave the hospital. Your doctor may also give you specific instructions. While your treatment has been planned according to the most current medical practices available, unavoidable complications occasionally occur. If you have any problems or questions after discharge, please call your doctor. ?ACTIVITY ?You may resume your regular activity but move at a slower pace for the next 24 hours.  ?Take frequent rest periods for the next 24 hours.  ?Walking will help expel (get rid of) the air and reduce the bloated feeling in your abdomen.  ?No driving for 24 hours (because of the anesthesia (medicine) used during the test).  ?You may shower.  ?Do not sign any important legal documents or operate any machinery for 24 hours (because of the anesthesia used during the test).  ?NUTRITION ?Drink plenty of fluids.  ?You may resume your normal diet.  ?Begin with a light meal and progress to your normal diet.  ?Avoid alcoholic beverages for 24 hours or as instructed by your caregiver.  ?MEDICATIONS ?You may resume your normal medications unless your caregiver tells you otherwise.  ?WHAT YOU CAN EXPECT TODAY ?You may experience abdominal discomfort such as a feeling of fullness or ?gas? pains.  ?FOLLOW-UP ?Your doctor will discuss the results of your test with you.  ?SEEK IMMEDIATE MEDICAL ATTENTION IF ANY OF THE FOLLOWING OCCUR: ?Excessive nausea (feeling sick to your stomach) and/or vomiting.  ?Severe abdominal pain and distention (swelling).  ?Trouble swallowing.  ?Temperature over 101? F (37.8? C).  ?Rectal bleeding or vomiting of blood.   ? ? ?  Your EGD could not be completed due to his food remaining in your stomach. ? ?You do not have any varicose veins in your esophagus. ? ?  At this time, I recommend you  just keep your appointment with Roseanne Kaufman in July of this year. ? ?  At patient request, I called Ronney Asters at [phone] -  reviewed findings and recommendations ?

## 2021-09-08 NOTE — Op Note (Signed)
[facility] ?Patient Name: [name] [name] ?Procedure Date: 09/08/2021 9:12 AM ?MRN: [id number] ?Date of Birth: 10-Mar-1966 ?Attending MD: Norvel Richards , MD ?CSN: [id number] ?Age: 56 ?Admit Type: Outpatient ?Procedure:                Upper GI endoscopy ?Indications:              Epigastric abdominal pain ?Providers:                Norvel Richards, MD, Janeece Riggers, RN, [place]  ?                          Page ?Referring MD:              ?Medicines:                Propofol per Anesthesia ?Complications:            No immediate complications. ?Estimated Blood Loss:     Estimated blood loss: none. ?Procedure:                Pre-Anesthesia Assessment: ?                          - Prior to the procedure, a History and Physical  ?                          was performed, and patient medications and  ?                          allergies were reviewed. The patient's tolerance of  ?                          previous anesthesia was also reviewed. The risks  ?                          and benefits of the procedure and the sedation  ?                          options and risks were discussed with the patient.  ?                          All questions were answered, and informed consent  ?                          was obtained. Prior Anticoagulants: The patient has  ?                          taken no previous anticoagulant or antiplatelet  ?                          agents. ASA Grade Assessment: III - A patient with  ?                          severe systemic disease. After reviewing the risks  ?  and benefits, the patient was deemed in  ?                          satisfactory condition to undergo the procedure. ?                          After obtaining informed consent, the endoscope was  ?                          passed under direct vision. Throughout the  ?                          procedure, the patient's blood pressure, pulse, and  ?                          oxygen saturations were  monitored continuously. The  ?                          GIF-H190 ([id number]) scope was introduced through the  ?                          mouth, and advanced to the second part of duodenum.  ?                          The upper GI endoscopy was accomplished without  ?                          difficulty. The patient tolerated the procedure  ?                          well. ?Scope In: 9:28:53 AM ?Scope Out: 9:31:20 AM ?Total Procedure Duration: 0 hours 2 minutes 27 seconds  ?Findings: ?     The examined esophagus was normal. ?     Retained food in the stomach and duodenum precluded complete  ?     examination. 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 Was unable to see the ampulla and other structures due to  ?     the presence of retained food. ?Impression:               - Normal esophagus. ?                          -Retained food in stomach and duodenum precluded  ?                          complete examination. Patient reports last intake  ?                          of food 12 hours prior to this procedure. However,  ?                          family members state he likely ate since that time. ?Moderate Sedation: ?     Moderate (conscious) sedation was personally administered by an  ?  anesthesia professional. The following parameters were monitored: oxygen  ?     saturation, heart rate, blood pressure, respiratory rate, EKG, adequacy  ?     of pulmonary ventilation, and response to care. ?Recommendation:           - Patient has a contact number available for  ?                          emergencies. The signs and symptoms of potential  ?                          delayed complications were discussed with the  ?                          patient. Return to normal activities tomorrow.  ?                          Written discharge instructions were provided to the  ?                          patient. ?                          - Advance diet as tolerated. Keep July follow-up  ?                           appointment with [place]. Further recommendations to  ?                          follow. ?Procedure Code(s):        --- Professional --- ?                          [phone], Esophagogastroduodenoscopy, flexible,  ?                          transoral; diagnostic, including collection of  ?                          specimen(s) by brushing or washing, when performed  ?                          (separate procedure) ?Diagnosis Code(s):        --- Professional --- ?                          R10.13, Epigastric pain ?CPT copyright 2019 American Medical Association. All rights reserved. ?The codes documented in this report are preliminary and upon coder review may  ?be revised to meet current compliance requirements. ?[name]. [name], MD ?Norvel Richards, MD ?09/08/2021 9:39:08 AM ?This report has been signed electronically. ?Number of Addenda: 0 ?

## 2021-09-12 NOTE — Progress Notes (Signed)
Noted, thank you

## 2021-09-15 ENCOUNTER — Encounter (HOSPITAL_COMMUNITY): Payer: Self-pay | Admitting: Internal Medicine

## 2021-11-03 ENCOUNTER — Telehealth: Payer: Self-pay | Admitting: Internal Medicine

## 2021-11-03 NOTE — Telephone Encounter (Signed)
Per 09/01/21 OV note "[place] in October 2023". Please NIC if not already. Thanks!

## 2021-11-03 NOTE — Telephone Encounter (Signed)
RECALL FOR ULTRASOUND 

## 2021-11-15 ENCOUNTER — Telehealth: Payer: Self-pay

## 2021-11-15 NOTE — Telephone Encounter (Signed)
Documentation from [facility]-Potential Adverse Drug Reaction to make you aware of a potential drug reaction with this pt.

## 2021-11-28 ENCOUNTER — Telehealth: Payer: Self-pay

## 2021-11-28 DIAGNOSIS — F1010 Alcohol abuse, uncomplicated: Secondary | ICD-10-CM | POA: Insufficient documentation

## 2021-11-28 NOTE — Telephone Encounter (Signed)
Consult note from [place] and Liver Transplant has been scanned into patient's chart and can be reviewed under the media tabl.

## 2021-12-06 ENCOUNTER — Ambulatory Visit (INDEPENDENT_AMBULATORY_CARE_PROVIDER_SITE_OTHER): Payer: Medicaid Other | Admitting: Gastroenterology

## 2021-12-06 ENCOUNTER — Encounter: Payer: Self-pay | Admitting: Gastroenterology

## 2021-12-06 VITALS — BP 144/87 | HR 62 | Temp 97.7°F | Ht 72.0 in | Wt 151.4 lb

## 2021-12-06 DIAGNOSIS — K7031 Alcoholic cirrhosis of liver with ascites: Secondary | ICD-10-CM

## 2021-12-06 NOTE — Progress Notes (Signed)
Gastroenterology Office Note     Primary Care Physician:  Carrolyn Meiers, MD  Primary Gastroenterologist: Dr. Gala Romney   Chief Complaint   Chief Complaint  Patient presents with   Follow-up    Pt states no problems today     History of Present Illness   [name] [name] is a 56 y.o. male presenting today in follow-up with a history of cirrhosis due to ETOH, followed by Liver clinic in [place] as well Roosevelt Locks, FNP). Recent EGD May 2023 with normal esophagus but retained food in stomach and duodenum precluded exam.   INR was elevated at 2.1 in July 2023; he was asked by the liver clinic to repeat this but hasn't completed yet. We will check today.   Admits to drinking one beer a few weeks ago. Abdomen feels tight only when sitting up.  Uses salt on food but says "only small amount".   Lasix 40 mg in morning and 20 mg in afternoon, spironolactone 100 mg in morning and 50 mg in the afternoon.   No lower extremity edema. Denies overt GI bleeding. No mental status changes or confusion.   Last para in March 2023. No SBP.     Past Medical History:  Diagnosis Date   Asthma    Cirrhosis ([place])    Dyspnea    High cholesterol    History of kidney stones    Hypertension    Kidney stones     Past Surgical History:  Procedure Laterality Date   APPENDECTOMY     BIOPSY  07/22/2020   Procedure: BIOPSY;  Surgeon: Daneil Dolin, MD;  [facility];  Service: Endoscopy;;   COLONOSCOPY WITH PROPOFOL N/A 04/18/2017   non-bleeding internal hemorrhoids, two 4-6 mm polyps in descending colon and cecum, pancolonic diverticulosis, single cecal AVM. Tubular adenomas, surveillance in 2023.    ESOPHAGOGASTRODUODENOSCOPY (EGD) WITH PROPOFOL N/A 07/22/2020   normal esophagus, small hiatal hernia, abnormal gastric mucosa, abnormal appearing ampula and periampullary mucosa. Mild chronic gastritis.   ESOPHAGOGASTRODUODENOSCOPY (EGD) WITH PROPOFOL N/A 09/08/2021    normal esophagus, retained food in stomach and duodenum precluded complete examination.   FRACTURE SURGERY     left arm   IR PARACENTESIS  05/31/2021   LOWER EXTREMITY VENOGRAPHY N/A 08/10/2020   Procedure: LOWER EXTREMITY VENOGRAPHY;  Surgeon: Waynetta Sandy, MD;  Location: [facility] [place] [facility];  Service: Cardiovascular;  Laterality: N/A;   PERIPHERAL VASCULAR BALLOON ANGIOPLASTY Left 08/10/2020   Procedure: PERIPHERAL VASCULAR BALLOON ANGIOPLASTY;  Surgeon: Waynetta Sandy, MD;  Location: [facility];  Service: Cardiovascular;  Laterality: Left;  lower extremity venous   PERIPHERAL VASCULAR THROMBECTOMY N/A 08/10/2020   Procedure: PERIPHERAL VASCULAR THROMBECTOMY;  Surgeon: Waynetta Sandy, MD;  Location: [place] [facility];  Service: Cardiovascular;  Laterality: N/A;   POLYPECTOMY  04/18/2017   Procedure: POLYPECTOMY;  Surgeon: Daneil Dolin, MD;  [facility];  Service: Endoscopy;;    Current Outpatient Medications  Medication Sig Dispense Refill   acetaminophen (TYLENOL) 500 MG tablet Take 1,000 mg by mouth every 6 (six) hours as needed for moderate pain.     albuterol (PROVENTIL HFA) 108 (90 Base) MCG/ACT inhaler INHALE 2 PUFFS BY MOUTH EVERY 6 HOURS AS NEEDED FOR COUGHING, WHEEZING, OR SHORTNESS OF BREATH 18 g 1   aspirin 81 MG chewable tablet Chew 81 mg by mouth daily.     folic acid (V-R FOLIC ACID) 563 MCG tablet Take  1 tablet (400 mcg total) by mouth daily. 90 tablet 3   furosemide (LASIX) 20 MG tablet Take 2 tablets in morning and 1 tablet in the afternoon. (40 milligrams in morning and 20 milligrams in afternoon). 90 tablet 3   metoprolol tartrate (LOPRESSOR) 25 MG tablet Take 25 mg by mouth 2 (two) times daily.     mometasone-formoterol (DULERA) 200-5 MCG/ACT AERO Inhale 2 puffs into the lungs 2 (two) times daily. 13 g 3   pantoprazole (PROTONIX) 40 MG tablet Take 1 tablet (40 mg total) by mouth daily. 90 tablet 3   spironolactone  (ALDACTONE) 50 MG tablet Take 50-100 mg by mouth See admin instructions. Take 100 mg by mouth in morning and 50 mg in the afternoon     No current facility-administered medications for this visit.    Allergies as of 12/06/2021   (No Known Allergies)    Family History  Problem Relation Age of Onset   Cancer Father        throat   Diabetes Sister    Colon cancer Neg Hx     Social History   Socioeconomic History   Marital status: Single    Spouse name: Not on file   Number of children: Not on file   Years of education: Not on file   Highest education level: Not on file  Occupational History   Not on file  Tobacco Use   Smoking status: Every Day    Packs/day: 0.50    Years: 33.00    Total pack years: 16.50    Types: Cigarettes   Smokeless tobacco: Never  Vaping Use   Vaping Use: Never used  Substance and Sexual Activity   Alcohol use: No    Comment: history of ETOH use 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 42s, no ETOH for about 4 weeks as of 07/07/20   Drug use: No   Sexual activity: Yes    Birth control/protection: None  Other Topics Concern   Not on file  Social History Narrative   Not on file   Social Determinants of Health   Financial Resource Strain: High Risk (09/13/2020)   Overall Financial Resource Strain (CARDIA)    Difficulty of Paying Living Expenses: Very hard  Food Insecurity: No Food Insecurity (09/13/2020)   Hunger Vital Sign    Worried About Running Out of Food in the Last Year: Never true    [place] in the Last Year: Never true  Transportation Needs: No Transportation Needs (09/13/2020)   PRAPARE - Hydrologist (Medical): No    Lack of Transportation (Non-Medical): No  Physical Activity: Sufficiently Active (09/13/2020)   Exercise Vital Sign    Days of Exercise per Week: 7 days    Minutes of Exercise per Session: 30 min  Stress: No Stress Concern Present (09/13/2020)   [place]    Feeling of Stress : Only a little  Social Connections: Moderately Isolated (09/13/2020)   Social Connection and Isolation Panel [NHANES]    Frequency of Communication with Friends and Family: More than three times a week    Frequency of Social Gatherings with Friends and Family: Once a week    Attends Religious Services: More than 4 times per year    Active Member of [employer] or Organizations: No    Attends Archivist Meetings: Never    Marital Status: Never married  Intimate Partner Violence: Not At  Risk (09/13/2020)   Humiliation, Afraid, Rape, and Kick questionnaire    Fear of Current or Ex-Partner: No    Emotionally Abused: No    Physically Abused: No    Sexually Abused: No     Review of Systems   Gen: Denies any fever, chills, fatigue, weight loss, lack of appetite.  CV: Denies chest pain, heart palpitations, peripheral edema, syncope.  Resp: Denies shortness of breath at rest or with exertion. Denies wheezing or cough.  GI:see HPI GU : Denies urinary burning, urinary frequency, urinary hesitancy MS: Denies joint pain, muscle weakness, cramps, or limitation of movement.  Derm: Denies rash, itching, dry skin Psych: Denies depression, anxiety, memory loss, and confusion Heme: Denies bruising, bleeding, and enlarged lymph nodes.   Physical Exam   BP (!) 144/87   Pulse 62   Temp 97.7 F (36.5 C)   Ht 6' (1.829 m)   Wt 151 lb 6.4 oz (68.7 kg)   BMI 20.53 kg/m  General:   Alert and oriented. Pleasant and cooperative. Well-nourished and well-developed.  Head:  Normocephalic and atraumatic. Eyes:  Without icterus Abdomen:  +BS, soft, non-tender and non-distended. No tense ascites. Hepatomegaly noted with liver margin palpable several finger breadths below costal margin. a Rectal:  Deferred  Msk:  Symmetrical without gross deformities. Normal posture. Extremities:  Without edema. Neurologic:  Alert and  oriented x4; Skin:  Intact  without significant lesions or rashes. Psych:  Alert and cooperative. Normal mood and affect.   Assessment   [name] [name] is a 56 y.o. male presenting today in follow-up with a history of ETOH cirrhosis, history of paras but negative SBP (last in March 2023), returning in follow-up today.    Stable at this time from a cirrhosis standpoint; however, he continues to drink alcohol intermittently and use added salt. We discussed this again in detail today. Appreciate Roosevelt Locks, NP, following patient as well. Although he reports abdomen feeling distended, I appreciate no tense ascites on exam. He will monitor this and call if needed. We discussed avoiding added salt and ETOH cessation. Will need to recheck INR as this was elevated in July 2023 on outside labs.   I have also provided Hep A/B vaccinations to be completed.  PLAN    Check INR Hep A/B vaccination prescription provided [place] in Oct 2023 EGD in 1 year, especially if continues to drink Lasix 40 mg in morning, 20 mg in afternoon. Aldactone 100 mg in morning, 50 mg in afternoon No added salt. ETOH cessation 3 month close follow-up   Annitta Needs, PhD, ANP-BC [facility]

## 2021-12-06 NOTE — Patient Instructions (Signed)
Please have blood work done.   We also recommend getting the Hepatitis A and B vaccinations. I have given a prescription for you to have done.  Avoid putting any extra salt on your food.  We will see you in 3 months!  Please call if your belly gets very tight or swollen!  I enjoyed seeing you again today! As you know, I value our relationship and want to provide genuine, compassionate, and quality care. I welcome your feedback. If you receive a survey regarding your visit,  I greatly appreciate you taking time to fill this out. See you next time!  Annitta Needs, PhD, ANP-BC [facility]

## 2021-12-09 LAB — PROTIME-INR
INR: 1
Prothrombin Time: 10.7 s (ref 9.0–11.5)

## 2022-01-18 ENCOUNTER — Encounter: Payer: Self-pay | Admitting: *Deleted

## 2022-01-18 ENCOUNTER — Telehealth: Payer: Self-pay | Admitting: *Deleted

## 2022-01-18 NOTE — Telephone Encounter (Signed)
Patient is on recall  for 3 month [place]

## 2022-01-18 NOTE — Telephone Encounter (Signed)
Recall mailed 

## 2022-01-24 ENCOUNTER — Telehealth: Payer: Self-pay

## 2022-01-24 NOTE — Telephone Encounter (Signed)
Pt phoned [place] regarding a refill on his Pantoprazole 40 mg. I advised the pt that he had 3 additional on file and he said they advised at [facility]/[address] to call us. I phoned to the pharmacy and was advised that they didn't know who he communicated to but he had 3 additional on file. I asked them to refill for the pt. Phoned the pt back and advised him that they are refilling his Rx for Pantoprazole. Pt expressed understanding.

## 2022-02-13 ENCOUNTER — Telehealth: Payer: Self-pay | Admitting: Internal Medicine

## 2022-02-13 NOTE — Telephone Encounter (Signed)
Pt left a message that he needs an ultrasoun scheduled.

## 2022-02-14 ENCOUNTER — Other Ambulatory Visit: Payer: Self-pay | Admitting: *Deleted

## 2022-02-14 DIAGNOSIS — K7469 Other cirrhosis of liver: Secondary | ICD-10-CM

## 2022-02-14 NOTE — Telephone Encounter (Signed)
Please arrange RUQ [place] due to history of cirrhosis.

## 2022-02-14 NOTE — Telephone Encounter (Signed)
Pt called back and left vm, tried to return call , voicemail not set up

## 2022-02-14 NOTE — Telephone Encounter (Signed)
LM with aunt to have pt call office back

## 2022-02-14 NOTE — Telephone Encounter (Signed)
Pt informed us scheduled for 02/22/22 arrive at 7:15 am for 7:30 am appointment. Nothing to eat or drink after midnight. Verbalized understanding.

## 2022-02-14 NOTE — Telephone Encounter (Signed)
Pt is due for his [place]. Please advise. Thank you

## 2022-02-14 NOTE — Progress Notes (Signed)
error 

## 2022-02-22 ENCOUNTER — Ambulatory Visit (HOSPITAL_COMMUNITY)
Admission: RE | Admit: 2022-02-22 | Discharge: 2022-02-22 | Disposition: A | Discharge status: Home / Self Care | Payer: Medicaid Other | Source: Ambulatory Visit | Attending: Gastroenterology | Admitting: Gastroenterology

## 2022-02-22 DIAGNOSIS — K7469 Other cirrhosis of liver: Secondary | ICD-10-CM | POA: Insufficient documentation

## 2022-03-01 ENCOUNTER — Ambulatory Visit: Payer: Medicaid Other | Admitting: Podiatry

## 2022-03-01 DIAGNOSIS — K824 Cholesterolosis of gallbladder: Secondary | ICD-10-CM | POA: Insufficient documentation

## 2022-03-02 ENCOUNTER — Inpatient Hospital Stay (HOSPITAL_COMMUNITY): Payer: Medicaid Other

## 2022-03-02 ENCOUNTER — Inpatient Hospital Stay (HOSPITAL_COMMUNITY)
Admission: EM | Admit: 2022-03-02 | Discharge: 2022-03-04 | DRG: 683 | Disposition: A | Discharge status: Home / Self Care | Payer: Medicaid Other | Attending: Internal Medicine | Admitting: Internal Medicine

## 2022-03-02 ENCOUNTER — Other Ambulatory Visit: Payer: Self-pay

## 2022-03-02 ENCOUNTER — Encounter (HOSPITAL_COMMUNITY): Payer: Self-pay | Admitting: Emergency Medicine

## 2022-03-02 DIAGNOSIS — E871 Hypo-osmolality and hyponatremia: Secondary | ICD-10-CM | POA: Diagnosis present

## 2022-03-02 DIAGNOSIS — K824 Cholesterolosis of gallbladder: Secondary | ICD-10-CM | POA: Diagnosis present

## 2022-03-02 DIAGNOSIS — J4489 Other specified chronic obstructive pulmonary disease: Secondary | ICD-10-CM | POA: Diagnosis present

## 2022-03-02 DIAGNOSIS — E872 Acidosis, unspecified: Secondary | ICD-10-CM | POA: Diagnosis present

## 2022-03-02 DIAGNOSIS — Z87442 Personal history of urinary calculi: Secondary | ICD-10-CM

## 2022-03-02 DIAGNOSIS — R809 Proteinuria, unspecified: Secondary | ICD-10-CM | POA: Diagnosis present

## 2022-03-02 DIAGNOSIS — D631 Anemia in chronic kidney disease: Secondary | ICD-10-CM | POA: Diagnosis present

## 2022-03-02 DIAGNOSIS — Z634 Disappearance and death of family member: Secondary | ICD-10-CM | POA: Diagnosis not present

## 2022-03-02 DIAGNOSIS — Z86718 Personal history of other venous thrombosis and embolism: Secondary | ICD-10-CM

## 2022-03-02 DIAGNOSIS — F17219 Nicotine dependence, cigarettes, with unspecified nicotine-induced disorders: Secondary | ICD-10-CM | POA: Diagnosis present

## 2022-03-02 DIAGNOSIS — K7030 Alcoholic cirrhosis of liver without ascites: Secondary | ICD-10-CM | POA: Diagnosis present

## 2022-03-02 DIAGNOSIS — Z79899 Other long term (current) drug therapy: Secondary | ICD-10-CM | POA: Diagnosis not present

## 2022-03-02 DIAGNOSIS — Z7982 Long term (current) use of aspirin: Secondary | ICD-10-CM | POA: Diagnosis not present

## 2022-03-02 DIAGNOSIS — I129 Hypertensive chronic kidney disease with stage 1 through stage 4 chronic kidney disease, or unspecified chronic kidney disease: Secondary | ICD-10-CM | POA: Diagnosis present

## 2022-03-02 DIAGNOSIS — K7031 Alcoholic cirrhosis of liver with ascites: Secondary | ICD-10-CM | POA: Diagnosis present

## 2022-03-02 DIAGNOSIS — N179 Acute kidney failure, unspecified: Secondary | ICD-10-CM | POA: Diagnosis present

## 2022-03-02 DIAGNOSIS — Z833 Family history of diabetes mellitus: Secondary | ICD-10-CM

## 2022-03-02 DIAGNOSIS — Z801 Family history of malignant neoplasm of trachea, bronchus and lung: Secondary | ICD-10-CM

## 2022-03-02 DIAGNOSIS — K766 Portal hypertension: Secondary | ICD-10-CM | POA: Diagnosis present

## 2022-03-02 DIAGNOSIS — N1831 Chronic kidney disease, stage 3a: Secondary | ICD-10-CM | POA: Diagnosis present

## 2022-03-02 DIAGNOSIS — D638 Anemia in other chronic diseases classified elsewhere: Secondary | ICD-10-CM | POA: Diagnosis present

## 2022-03-02 DIAGNOSIS — J449 Chronic obstructive pulmonary disease, unspecified: Secondary | ICD-10-CM | POA: Diagnosis present

## 2022-03-02 DIAGNOSIS — R309 Painful micturition, unspecified: Secondary | ICD-10-CM | POA: Diagnosis present

## 2022-03-02 DIAGNOSIS — E7800 Pure hypercholesterolemia, unspecified: Secondary | ICD-10-CM | POA: Diagnosis present

## 2022-03-02 LAB — CBC WITH DIFFERENTIAL/PLATELET
Abs Immature Granulocytes: 0.05 10*3/uL (ref 0.00–0.07)
Basophils Absolute: 0.1 10*3/uL (ref 0.0–0.1)
Basophils Relative: 1 %
Eosinophils Absolute: 0.2 10*3/uL (ref 0.0–0.5)
Eosinophils Relative: 2 %
HCT: 37.8 % — ABNORMAL LOW (ref 39.0–52.0)
Hemoglobin: 12.7 g/dL — ABNORMAL LOW (ref 13.0–17.0)
Immature Granulocytes: 1 %
Lymphocytes Relative: 22 %
Lymphs Abs: 2.3 10*3/uL (ref 0.7–4.0)
MCH: 31.8 pg (ref 26.0–34.0)
MCHC: 33.6 g/dL (ref 30.0–36.0)
MCV: 94.7 fL (ref 80.0–100.0)
Monocytes Absolute: 1.3 10*3/uL — ABNORMAL HIGH (ref 0.1–1.0)
Monocytes Relative: 13 %
Neutro Abs: 6.3 10*3/uL (ref 1.7–7.7)
Neutrophils Relative %: 61 %
Platelets: 151 10*3/uL (ref 150–400)
RBC: 3.99 MIL/uL — ABNORMAL LOW (ref 4.22–5.81)
RDW: 18.6 % — ABNORMAL HIGH (ref 11.5–15.5)
WBC: 10.1 10*3/uL (ref 4.0–10.5)
nRBC: 0.2 % (ref 0.0–0.2)

## 2022-03-02 LAB — COMPREHENSIVE METABOLIC PANEL
ALT: 15 U/L (ref 0–44)
AST: 42 U/L — ABNORMAL HIGH (ref 15–41)
Albumin: 2.6 g/dL — ABNORMAL LOW (ref 3.5–5.0)
Alkaline Phosphatase: 152 U/L — ABNORMAL HIGH (ref 38–126)
Anion gap: 11 (ref 5–15)
BUN: 43 mg/dL — ABNORMAL HIGH (ref 6–20)
CO2: 21 mmol/L — ABNORMAL LOW (ref 22–32)
Calcium: 7.6 mg/dL — ABNORMAL LOW (ref 8.9–10.3)
Chloride: 101 mmol/L (ref 98–111)
Creatinine, Ser: 2.88 mg/dL — ABNORMAL HIGH (ref 0.61–1.24)
GFR, Estimated: 25 mL/min — ABNORMAL LOW (ref >60)
Glucose, Bld: 76 mg/dL (ref 70–99)
Potassium: 3.6 mmol/L (ref 3.5–5.1)
Sodium: 133 mmol/L — ABNORMAL LOW (ref 135–145)
Total Bilirubin: 0.3 mg/dL (ref 0.3–1.2)
Total Protein: 7.8 g/dL (ref 6.5–8.1)

## 2022-03-02 LAB — URINALYSIS, ROUTINE W REFLEX MICROSCOPIC
Bacteria, UA: NONE SEEN
Bilirubin Urine: NEGATIVE
Glucose, UA: NEGATIVE mg/dL
Hgb urine dipstick: NEGATIVE
Ketones, ur: NEGATIVE mg/dL
Nitrite: NEGATIVE
Protein, ur: >=300 mg/dL — AB
Specific Gravity, Urine: 1.011 (ref 1.005–1.030)
pH: 6 (ref 5.0–8.0)

## 2022-03-02 LAB — CREATININE, SERUM
Creatinine, Ser: 2.97 mg/dL — ABNORMAL HIGH (ref 0.61–1.24)
GFR, Estimated: 24 mL/min — ABNORMAL LOW (ref >60)

## 2022-03-02 LAB — HIV ANTIBODY (ROUTINE TESTING W REFLEX): HIV Screen 4th Generation wRfx: NONREACTIVE

## 2022-03-02 LAB — CBC
HCT: 36.7 % — ABNORMAL LOW (ref 39.0–52.0)
Hemoglobin: 12.3 g/dL — ABNORMAL LOW (ref 13.0–17.0)
MCH: 32.1 pg (ref 26.0–34.0)
MCHC: 33.5 g/dL (ref 30.0–36.0)
MCV: 95.8 fL (ref 80.0–100.0)
Platelets: 151 10*3/uL (ref 150–400)
RBC: 3.83 MIL/uL — ABNORMAL LOW (ref 4.22–5.81)
RDW: 18.1 % — ABNORMAL HIGH (ref 11.5–15.5)
WBC: 8.6 10*3/uL (ref 4.0–10.5)
nRBC: 0 % (ref 0.0–0.2)

## 2022-03-02 LAB — SODIUM, URINE, RANDOM: Sodium, Ur: 30 mmol/L

## 2022-03-02 LAB — LIPASE, BLOOD: Lipase: 91 U/L — ABNORMAL HIGH (ref 11–51)

## 2022-03-02 LAB — CREATININE, URINE, RANDOM: Creatinine, Urine: 99.22 mg/dL

## 2022-03-02 MED ORDER — METOPROLOL TARTRATE 25 MG PO TABS
25.0000 mg | ORAL_TABLET | Freq: Two times a day (BID) | ORAL | Status: DC
  Administered 2022-03-02 – 2022-03-04 (×4): 25 mg via ORAL
  Filled 2022-03-02 (×4): qty 1

## 2022-03-02 MED ORDER — PANTOPRAZOLE SODIUM 40 MG PO TBEC
40.0000 mg | DELAYED_RELEASE_TABLET | Freq: Every day | ORAL | Status: DC
  Administered 2022-03-03 – 2022-03-04 (×2): 40 mg via ORAL
  Filled 2022-03-02 (×2): qty 1

## 2022-03-02 MED ORDER — SODIUM CHLORIDE 0.9 % IV SOLN: INTRAVENOUS | Status: AC

## 2022-03-02 MED ORDER — SODIUM CHLORIDE 0.9 % IV BOLUS
1000.0000 mL | Freq: Once | INTRAVENOUS | Status: AC
  Administered 2022-03-02: 1000 mL via INTRAVENOUS

## 2022-03-02 MED ORDER — NICOTINE 7 MG/24HR TD PT24
7.0000 mg | MEDICATED_PATCH | Freq: Every day | TRANSDERMAL | Status: DC
  Administered 2022-03-02 – 2022-03-04 (×3): 7 mg via TRANSDERMAL
  Filled 2022-03-02 (×3): qty 1

## 2022-03-02 MED ORDER — MOMETASONE FURO-FORMOTEROL FUM 200-5 MCG/ACT IN AERO
2.0000 | INHALATION_SPRAY | Freq: Two times a day (BID) | RESPIRATORY_TRACT | Status: DC
  Administered 2022-03-02 – 2022-03-04 (×4): 2 via RESPIRATORY_TRACT
  Filled 2022-03-02: qty 8.8

## 2022-03-02 MED ORDER — HEPARIN SODIUM (PORCINE) 5000 UNIT/ML IJ SOLN
5000.0000 [IU] | Freq: Three times a day (TID) | INTRAMUSCULAR | Status: DC
  Administered 2022-03-02 – 2022-03-04 (×4): 5000 [IU] via SUBCUTANEOUS
  Filled 2022-03-02 (×5): qty 1

## 2022-03-02 MED ORDER — ONDANSETRON HCL 4 MG PO TABS: 4.0000 mg | ORAL_TABLET | Freq: Four times a day (QID) | ORAL | Status: DC | PRN

## 2022-03-02 MED ORDER — ACETAMINOPHEN 325 MG PO TABS
650.0000 mg | ORAL_TABLET | Freq: Four times a day (QID) | ORAL | Status: DC | PRN
  Administered 2022-03-04: 650 mg via ORAL
  Filled 2022-03-02: qty 2

## 2022-03-02 MED ORDER — LORAZEPAM 2 MG/ML IJ SOLN: 1.0000 mg | INTRAMUSCULAR | Status: DC | PRN

## 2022-03-02 MED ORDER — ALBUTEROL SULFATE HFA 108 (90 BASE) MCG/ACT IN AERS: 1.0000 | INHALATION_SPRAY | RESPIRATORY_TRACT | Status: DC | PRN

## 2022-03-02 MED ORDER — THIAMINE MONONITRATE 100 MG PO TABS
100.0000 mg | ORAL_TABLET | Freq: Every day | ORAL | Status: DC
  Administered 2022-03-02 – 2022-03-04 (×3): 100 mg via ORAL
  Filled 2022-03-02 (×3): qty 1

## 2022-03-02 MED ORDER — ALBUTEROL SULFATE (2.5 MG/3ML) 0.083% IN NEBU: 2.5000 mg | INHALATION_SOLUTION | RESPIRATORY_TRACT | Status: DC | PRN

## 2022-03-02 MED ORDER — ADULT MULTIVITAMIN W/MINERALS CH
1.0000 | ORAL_TABLET | Freq: Every day | ORAL | Status: DC
  Administered 2022-03-02 – 2022-03-04 (×3): 1 via ORAL
  Filled 2022-03-02 (×3): qty 1

## 2022-03-02 MED ORDER — ASPIRIN 81 MG PO CHEW
81.0000 mg | CHEWABLE_TABLET | Freq: Every day | ORAL | Status: DC
  Administered 2022-03-03 – 2022-03-04 (×2): 81 mg via ORAL
  Filled 2022-03-02 (×2): qty 1

## 2022-03-02 MED ORDER — FOLIC ACID 1 MG PO TABS
1.0000 mg | ORAL_TABLET | Freq: Every day | ORAL | Status: DC
  Administered 2022-03-02 – 2022-03-04 (×3): 1 mg via ORAL
  Filled 2022-03-02 (×3): qty 1

## 2022-03-02 MED ORDER — THIAMINE HCL 100 MG/ML IJ SOLN: 100.0000 mg | Freq: Every day | INTRAMUSCULAR | Status: DC

## 2022-03-02 MED ORDER — ACETAMINOPHEN 650 MG RE SUPP: 650.0000 mg | Freq: Four times a day (QID) | RECTAL | Status: DC | PRN

## 2022-03-02 MED ORDER — ONDANSETRON HCL 4 MG/2ML IJ SOLN: 4.0000 mg | Freq: Four times a day (QID) | INTRAMUSCULAR | Status: DC | PRN

## 2022-03-02 MED ORDER — ACETAMINOPHEN 500 MG PO TABS
1000.0000 mg | ORAL_TABLET | Freq: Four times a day (QID) | ORAL | Status: DC | PRN
  Administered 2022-03-03: 1000 mg via ORAL
  Filled 2022-03-02: qty 2

## 2022-03-02 MED ORDER — FOLIC ACID 400 MCG PO TABS: 400.0000 ug | ORAL_TABLET | Freq: Every day | ORAL | Status: DC

## 2022-03-02 MED ORDER — LORAZEPAM 1 MG PO TABS: 1.0000 mg | ORAL_TABLET | ORAL | Status: DC | PRN

## 2022-03-02 NOTE — ED Notes (Signed)
Ultrasound at bedside, floor to come get pt and take upstairs

## 2022-03-02 NOTE — H&P (Signed)
Date: 03/02/2022               Patient Name:  [name] [name] MRN: [id number]  DOB: November 17, 1965 Age / Sex: 56 y.o., male   PCP: Carrolyn Meiers, MD              Medical Service: Internal Medicine Teaching Service              Attending Physician: Dr. Manuella Ghazi, Pratik D, DO    First Contact: Stanford Breed, MS     Second Contact: Dr. Manuella Ghazi, Pratik D, DO                         Chief Complaint: Abnormal Lab  History of Present Illness: [name] [name] is a 56 year old male with a history of alcoholic cirrhosis of the liver, COPD, prior kidney stones, prior DVT, chronic tobacco use, and anemia of chronic disease who presented to the ED today due to elevated creatinine. Patient had a follow-up appointment yesterday with [facility] in [place] to address alcoholic cirrhosis of the liver with ascites, portal hypertension, and a gallbladder polyp. Labs from this appointment revealed a creatinine of 2.98 so patient was told by PCP to come to the ED. Also of note during this appointment was a recommendation that his gallbladder be removed due to an increase in polyp size. Patient endorses 5 out of 10 pain with urination that has been ongoing for the past month. He states he recently completed a 10-day course of antibiotics (unsure of which one) for a suspected UTI without resolution of his symptoms. He also endorses foamy urine for the past month. Patient is otherwise asymptomatic. Patient states that he recently started drinking again last month (3 beers/day) after his mother passed away. Denies any other lifestyle changes.  ED Course: Patient is afebrile with stable vitals. Creatinine is 2.88 with GFR of 25. Patient has elevated LFT's with Alkaline Phosphatase (152), AST (42), with normal ALT (15). Lipase also elevated at 91. UA reveals moderate leukocytes and > 300 protein with no bacteria and 21-50 WBC on microscopy. Culture pending. Patient received one NS fluid bolus.  Patient currently undergoing renal ultrasound.    Meds:  Current Meds  Medication Sig   acetaminophen (TYLENOL) 500 MG tablet Take 1,000 mg by mouth every 6 (six) hours as needed for moderate pain.   albuterol (PROVENTIL HFA) 108 (90 Base) MCG/ACT inhaler INHALE 2 PUFFS BY MOUTH EVERY 6 HOURS AS NEEDED FOR COUGHING, WHEEZING, OR SHORTNESS OF BREATH   aspirin 81 MG chewable tablet Chew 81 mg by mouth daily.   folic acid (V-R FOLIC ACID) 709 MCG tablet Take 1 tablet (400 mcg total) by mouth daily.   furosemide (LASIX) 20 MG tablet Take 2 tablets in morning and 1 tablet in the afternoon. (40 milligrams in morning and 20 milligrams in afternoon).   metoprolol tartrate (LOPRESSOR) 25 MG tablet Take 25 mg by mouth 2 (two) times daily.   mometasone-formoterol (DULERA) 200-5 MCG/ACT AERO Inhale 2 puffs into the lungs 2 (two) times daily.   pantoprazole (PROTONIX) 40 MG tablet Take 1 tablet (40 mg total) by mouth daily.   spironolactone (ALDACTONE) 50 MG tablet Take 50-100 mg by mouth See admin instructions. Take 100 mg by mouth in morning and 50 mg in the afternoon     Allergies: Allergies as of 03/02/2022   (No Known Allergies)   Past Medical History:  Diagnosis Date  Asthma    Cirrhosis ([place])    Dyspnea    High cholesterol    History of kidney stones    Hypertension    Kidney stones     Family History: Father deceased in Jul 27, 1997 from lung cancer, mother deceased from kidney failure, sister has diabetes.  Social History: Lives at home with girlfriend of 13 years. Does not work (disabled from fall that occurred years ago affecting elbow/ability to lift), endorses 1/2 pack per day tobacco use, 3 beers/day, and occasional marijuana use. Denies any other illegal drugs. No recent travel.  Review of Systems: A complete ROS was negative except as per HPI.   Physical Exam: Blood pressure 108/78, pulse 74, temperature 98.3 F (36.8 C), temperature source Oral, resp. rate 20, SpO2 96  %.  Physical Examination  General: lying in bed, in no acute distress Head: normocephalic, atraumatic Mouth: poor dentition, oropharynx is clear Neck: supple, without masses or lymphadenopathy Cardiovascular: regular rate and rhythm without murmurs, rubs, or gallops, no LEE Respiratory: normal respiratory effort, lungs CTAB Abdominal: normoactive bowel sounds, hepatomegaly, no tenderness to palpation Skin: warm, dry, no lesions Central Nervous System: alert and oriented x 3 Psychiatric: Normal mood and affect   Assessment & Plan by Problem: Principal Problem:   AKI (acute kidney injury) ([place])  Acute Kidney Injury: Unclear etiology but is most likely multifactorial. Patient's creatinine is 2.88 with a baseline around 1.5. Patient with stable vitals and normal WBC. UA showing sterile pyuria with nephrotic range proteinuria. Patient has a recent history of antibiotic use for suspected UTI without resolution of dysuria. Patient also has a history of kidney stones and patient is currently undergoing renal ultrasound. Patient denies flank pain and no blood found in urine.  -Continue IV fluids -Follow-up with renal ultrasound results -Monitor BMP -Avoid nephrotoxic medications if possible  Cigarette Nicotine Dependence with Nicotine-Induced Disorder: -Nicotine patch ordered  Anemia of Chronic Disease: Hgb is 12.7 which is at or better than baseline. -Monitor CBC  COPD: Patient without wheezing on exam Continue home regimen of Proventil PRN and Dulera  Dispo: Admit patient to Observation with expected length of stay less than 2 midnights.  Signed: Stanford Breed, Medical Student 03/02/2022, 2:41 PM

## 2022-03-02 NOTE — ED Provider Notes (Signed)
[facility] EMERGENCY DEPARTMENT Provider Note   CSN: [id number] Arrival date & time: 03/02/22  1301     History  Chief Complaint  Patient presents with   Abnormal Lab    [name] [name] is a 56 y.o. male with a history of alcoholic cirrhosis of the liver, COPD, prior kidney stones, prior DVT, chronic tobacco use, and anemia of chronic disease.  Presenting to the ED today due to an abnormal lab finding yesterday.  Reportedly creatinine of 2.99 per patient.  Patient states "I feel fine".  Denies abdominal pain, changes in bowel habits, N/V/D, fevers, chills, dizziness, chest pain, or shortness of breath.  Though does admit to an occasional "zinging" sensation occasionally when urinating, however denies any pain, discharge, or discomfort.  Denies change in urine color, frequency, or amount.  Denies flank pain or back pain.  Per review of records, seen by [facility] in [facility] yesterday for follow-up of the alcoholic cirrhosis of the liver with ascites, portal hypertension, and gallbladder polyp.  Was recommended to have his gallbladder removed due to an associated polyp's increased in size.  Was provided information for [facility] surgery to schedule consult, and was recommended to cease alcohol consumption.  Creatinine noted to be 2.98 with a GFR of 24 at that time.  The history is provided by the patient and medical records.  Abnormal Lab     Home Medications Prior to Admission medications   Medication Sig Start Date End Date Taking? Authorizing Provider  acetaminophen (TYLENOL) 500 MG tablet Take 1,000 mg by mouth every 6 (six) hours as needed for moderate pain.   Yes [provider]  albuterol (PROVENTIL HFA) 108 (90 Base) MCG/ACT inhaler INHALE 2 PUFFS BY MOUTH EVERY 6 HOURS AS NEEDED FOR COUGHING, WHEEZING, OR SHORTNESS OF BREATH 02/10/21  Yes Emokpae, Courage, MD  aspirin 81 MG chewable tablet Chew 81 mg by mouth daily.   Yes [provider]  folic acid (V-R FOLIC ACID) 754 MCG tablet Take 1 tablet (400 mcg total) by mouth daily. 09/07/21  Yes Pennington, Rebekah M, PA-C  furosemide (LASIX) 20 MG tablet Take 2 tablets in morning and 1 tablet in the afternoon. (40 milligrams in morning and 20 milligrams in afternoon). 07/13/21  Yes Annitta Needs, NP  metoprolol tartrate (LOPRESSOR) 25 MG tablet Take 25 mg by mouth 2 (two) times daily. 05/24/21  Yes [provider]  mometasone-formoterol (DULERA) 200-5 MCG/ACT AERO Inhale 2 puffs into the lungs 2 (two) times daily. 02/10/21  Yes Emokpae, Courage, MD  pantoprazole (PROTONIX) 40 MG tablet Take 1 tablet (40 mg total) by mouth daily. 09/01/21 09/01/22 Yes Annitta Needs, NP  spironolactone (ALDACTONE) 50 MG tablet Take 50-100 mg by mouth See admin instructions. Take 100 mg by mouth in morning and 50 mg in the afternoon   Yes [provider]      Allergies    Patient has no known allergies.    Review of Systems   Review of Systems  Constitutional:        Abnormal lab    Physical Exam Updated Vital Signs BP 108/78 (BP Location: Right Arm)   Pulse 74   Temp 98.3 F (36.8 C) (Oral)   Resp 20   SpO2 96%  Physical Exam Vitals and nursing note reviewed.  Constitutional:      General: He is not in acute distress.    Appearance: He is well-developed. He is not ill-appearing, toxic-appearing or  diaphoretic.     Comments: Malodorous of tobacco  HENT:     Head: Normocephalic and atraumatic.     Mouth/Throat:     Pharynx: Oropharynx is clear.  Eyes:     Conjunctiva/sclera: Conjunctivae normal.  Cardiovascular:     Rate and Rhythm: Normal rate and regular rhythm.     Pulses: Normal pulses.     Heart sounds: No murmur heard. Pulmonary:     Effort: Pulmonary effort is normal. No respiratory distress.     Breath sounds: Normal breath sounds.  Abdominal:     General: Bowel sounds are normal.     Palpations: Abdomen is soft. There is hepatomegaly (With mild  tenderness).     Tenderness: There is no abdominal tenderness. There is no guarding. Negative signs include Murphy's sign and McBurney's sign.  Musculoskeletal:        General: No swelling.     Cervical back: Neck supple.     Right lower leg: No edema.     Left lower leg: No edema.  Skin:    General: Skin is warm and dry.     Capillary Refill: Capillary refill takes less than 2 seconds.     Coloration: Skin is not jaundiced or pale.  Neurological:     Mental Status: He is alert and oriented to person, place, and time.     Coordination: Coordination normal.  Psychiatric:        Mood and Affect: Mood normal.     ED Results / Procedures / Treatments   Labs (all labs ordered are listed, but only abnormal results are displayed) Labs Reviewed  CBC WITH DIFFERENTIAL/PLATELET - Abnormal; Notable for the following components:      Result Value   RBC 3.99 (*)    Hemoglobin 12.7 (*)    HCT 37.8 (*)    RDW 18.6 (*)    Monocytes Absolute 1.3 (*)    All other components within normal limits  COMPREHENSIVE METABOLIC PANEL - Abnormal; Notable for the following components:   Sodium 133 (*)    CO2 21 (*)    BUN 43 (*)    Creatinine, Ser 2.88 (*)    Calcium 7.6 (*)    Albumin 2.6 (*)    AST 42 (*)    Alkaline Phosphatase 152 (*)    GFR, Estimated 25 (*)    All other components within normal limits  LIPASE, BLOOD - Abnormal; Notable for the following components:   Lipase 91 (*)    All other components within normal limits  URINE CULTURE  URINALYSIS, ROUTINE W REFLEX MICROSCOPIC    EKG None  Radiology No results found.  Procedures Procedures    Medications Ordered in ED Medications  sodium chloride 0.9 % bolus 1,000 mL (1,000 mLs Intravenous New Bag/Given 03/02/22 1338)    ED Course/ Medical Decision Making/ A&P Clinical Course as of 03/02/22 1432  Thu Mar 02, 2022  1425 Creatinine(!): 2.88 [AC]  1425 GFR, Estimated(!): 25 [AC]    Clinical Course User Index [AC]  Prince Rome, PA-C                           Medical Decision Making Amount and/or Complexity of Data Reviewed Labs: ordered. Decision-making details documented in ED Course.  Risk Decision regarding hospitalization.   56 y.o. male presents to the ED for concern of Abnormal Lab     This involves an extensive number of treatment options, and  is a complaint that carries with it a high risk of complications and morbidity.  The emergent differential diagnosis prior to evaluation includes, but is not limited to: AKI, UTI, acute cystitis, renal calculi  This is not an exhaustive differential.   Past Medical History / Co-morbidities / Social History: Hx of  alcoholic cirrhosis of the liver, COPD, prior kidney stones, prior DVT, chronic tobacco use, portal hypertension, and anemia of chronic disease Social Determinants of Health include: chronic severe tobacco use for which cessation counseling was provided  Additional History:  Obtained by chart review.  Notably recent visit with [facility]  Lab Tests: I ordered, and personally interpreted labs.  The pertinent results include:   Creatinine 2.88 and GFR 25; significant change from 7/23 Creatinine 1.58 and GFR 51 Lipase 91 Albumin 2.6, Alk phos 152, AST 42 Sodium 133, stable Mild stable anemia Total bili 0.3. UA and culture pending  Imaging Studies: None  ED Course / Critical Interventions: Pt overall well-appearing on exam.  Nontoxic, nonseptic in NAD.  AAOx4.  Afebrile.  Hemodynamically stable.  Presenting today with abnormal labs.  Reported significant change in kidney function noted at yesterday's appointment with [facility].  Today's labs supportive of same, with increased creatinine from baseline and GFR 25.  Total bili 0.3, does not appear jaundiced.  Was recommended to come to the ED for further evaluation and likely admission.  Patient without significant urinary  symptoms.  UA and culture pending.  No suprapubic tenderness or flank pain.  No hematuria.  Low suspicion for renal calculi.  With known Hx of alcoholic cirrhosis of the liver.  Also was recommended to follow-up with general surgery next week to discuss likely cholecystectomy, though was informed this would likely need to be done using an alternative method due to his advanced liver cirrhosis. Patient appears disheveled on exam.  Hepatomegaly with notable abdominal firmness/distention.  With mild tenderness appreciated.  Negative Murphy sign however.  No evidence of new lower extremity edema.  Extremities appear neurovascularly intact.  Due to significant change in renal function and with consideration of patient's history and recent visit with liver specialist, recommend admission for further work-up and evaluation.  Consulted with Dr. Manuella Ghazi of hospitalist team, discussed patient's case and history in detail.  Agrees with plan for admission.  Disposition: Admission  I discussed this case with my attending, Dr. Alvino Chapel, who agreed with the proposed treatment course and cosigned this note.  Attending physician stated agreement with plan or made changes to plan which were implemented.     This chart was dictated using voice recognition software.  Despite best efforts to proofread, errors can occur which can change the documentation meaning.         Final Clinical Impression(s) / ED Diagnoses Final diagnoses:  AKI (acute kidney injury) ([place])  Alcoholic cirrhosis of liver with ascites [facility])    Rx / DC Orders ED Discharge Orders     None         Candace Cruise 80/03/49 1500    Davonna Belling, MD 03/02/22 1535

## 2022-03-02 NOTE — ED Triage Notes (Signed)
Pt sent by pcp for creatinine 2.99. pt states he feels fine. Denies pain/gu/gi symptoms.pt mildy groggy in triage with mild unsteady gait.

## 2022-03-03 DIAGNOSIS — N179 Acute kidney failure, unspecified: Secondary | ICD-10-CM | POA: Diagnosis not present

## 2022-03-03 LAB — BASIC METABOLIC PANEL
Anion gap: 9 (ref 5–15)
Anion gap: 6 (ref 5–15)
BUN: 45 mg/dL — ABNORMAL HIGH (ref 6–20)
BUN: 41 mg/dL — ABNORMAL HIGH (ref 6–20)
CO2: 19 mmol/L — ABNORMAL LOW (ref 22–32)
CO2: 21 mmol/L — ABNORMAL LOW (ref 22–32)
Calcium: 7.3 mg/dL — ABNORMAL LOW (ref 8.9–10.3)
Calcium: 7.5 mg/dL — ABNORMAL LOW (ref 8.9–10.3)
Chloride: 109 mmol/L (ref 98–111)
Chloride: 108 mmol/L (ref 98–111)
Creatinine, Ser: 2.41 mg/dL — ABNORMAL HIGH (ref 0.61–1.24)
Creatinine, Ser: 2.44 mg/dL — ABNORMAL HIGH (ref 0.61–1.24)
GFR, Estimated: 31 mL/min — ABNORMAL LOW (ref >60)
GFR, Estimated: 30 mL/min — ABNORMAL LOW (ref >60)
Glucose, Bld: 100 mg/dL — ABNORMAL HIGH (ref 70–99)
Glucose, Bld: 123 mg/dL — ABNORMAL HIGH (ref 70–99)
Potassium: 4.1 mmol/L (ref 3.5–5.1)
Potassium: 3.8 mmol/L (ref 3.5–5.1)
Sodium: 137 mmol/L (ref 135–145)
Sodium: 135 mmol/L (ref 135–145)

## 2022-03-03 LAB — CBC
HCT: 35 % — ABNORMAL LOW (ref 39.0–52.0)
HCT: 33.9 % — ABNORMAL LOW (ref 39.0–52.0)
Hemoglobin: 11.8 g/dL — ABNORMAL LOW (ref 13.0–17.0)
Hemoglobin: 11.3 g/dL — ABNORMAL LOW (ref 13.0–17.0)
MCH: 32.2 pg (ref 26.0–34.0)
MCH: 32 pg (ref 26.0–34.0)
MCHC: 33.7 g/dL (ref 30.0–36.0)
MCHC: 33.3 g/dL (ref 30.0–36.0)
MCV: 95.6 fL (ref 80.0–100.0)
MCV: 96 fL (ref 80.0–100.0)
Platelets: 137 10*3/uL — ABNORMAL LOW (ref 150–400)
Platelets: 123 10*3/uL — ABNORMAL LOW (ref 150–400)
RBC: 3.66 MIL/uL — ABNORMAL LOW (ref 4.22–5.81)
RBC: 3.53 MIL/uL — ABNORMAL LOW (ref 4.22–5.81)
RDW: 18.5 % — ABNORMAL HIGH (ref 11.5–15.5)
RDW: 18.8 % — ABNORMAL HIGH (ref 11.5–15.5)
WBC: 6.8 10*3/uL (ref 4.0–10.5)
WBC: 6.6 10*3/uL (ref 4.0–10.5)
nRBC: 0 % (ref 0.0–0.2)
nRBC: 0 % (ref 0.0–0.2)

## 2022-03-03 LAB — MAGNESIUM
Magnesium: 1.4 mg/dL — ABNORMAL LOW (ref 1.7–2.4)
Magnesium: 2 mg/dL (ref 1.7–2.4)

## 2022-03-03 MED ORDER — LACTATED RINGERS IV SOLN: INTRAVENOUS | Status: DC

## 2022-03-03 MED ORDER — SODIUM BICARBONATE 650 MG PO TABS
650.0000 mg | ORAL_TABLET | Freq: Three times a day (TID) | ORAL | Status: DC
  Administered 2022-03-03 – 2022-03-04 (×4): 650 mg via ORAL
  Filled 2022-03-03 (×4): qty 1

## 2022-03-03 MED ORDER — MAGNESIUM SULFATE 2 GM/50ML IV SOLN
2.0000 g | Freq: Once | INTRAVENOUS | Status: AC
  Administered 2022-03-03: 2 g via INTRAVENOUS
  Filled 2022-03-03: qty 50

## 2022-03-03 NOTE — Progress Notes (Signed)
   Subjective:  Patient feels well overall and states that he has been urinating normally. No acute events overnight.  Objective:  Vital signs in last 24 hours: Vitals:   03/02/22 2356 03/03/22 0412 03/03/22 0613 03/03/22 0800  BP: 120/72 111/75 106/72   Pulse: 76 68 71   Resp: '14 16 18   '$ Temp: 99.6 F (37.6 C) 98.3 F (36.8 C) 98.9 F (37.2 C)   TempSrc:  Oral Oral   SpO2: 99% 100% 98% 97%  Weight:      Height:       Weight change:   Intake/Output Summary (Last 24 hours) at 03/03/2022 1030 Last data filed at 03/03/2022 0533 Gross per 24 hour  Intake 2105.1 ml  Output 250 ml  Net 1855.1 ml   Physical Examination   General: lying in bed, in no acute distress Head: normocephalic, atraumatic Cardiovascular: regular rate and rhythm without murmurs, rubs, or gallops, no LEE Respiratory: normal respiratory effort, lungs CTAB Abdominal: normoactive bowel sounds, hepatomegaly, no tenderness to palpation Skin: warm, dry, no lesions Central Nervous System: alert and oriented x 3 Psychiatric: Normal mood and affect    Assessment/Plan:  Principal Problem:   AKI (acute kidney injury) ([place]) Active Problems:   Cigarette nicotine dependence with nicotine-induced disorder   Alcoholic cirrhosis of liver with ascites (H[facility])   COPD (chronic obstructive pulmonary disease) (H[facility])   Anemia of chronic disease   AKI on CKD stage IIIa with associated mild metabolic acidosis Creatinine down-trended to 2.41 (2.97 yesterday). Baseline is 1.4-1.5. Bicarbonate is 19 (21 yesterday). Patient received 1 bolus NS in ED and is receiving LR infusion. Patient also initiated on sodium bicarbonate po 650 mg TID today. Urine sodium and urine creatinine normal. FENa is 0.7% consistent with prerenal etiology. Nephrology consulted and agrees with treatment plan. Patient underwent renal ultrasound with findings as below: 1. Sessile polypoid irregularity posteriorly in the urinary bladder wall. Cannot  exclude neoplasm such as transitional cell carcinoma. Cystoscopy may be helpful for further evaluation. 2. Chronically atrophic left kidney. 3. No hydronephrosis. -Discontinue home Lasix and Spironolactone for now -Follow strict I's and O's -Avoid other nephrotoxic agents -Trend BMP -Patient follows with nephrology (Dr. Hollie Salk) as an outpatient -Follow-up with ultrasound findings with outpatient Urology   Proteinuria -Seen on urine analysis and noted to have foamy urine -Related to above with history of CKD  Mild hyponatremia -Possibly related to ongoing alcohol use. Na was 133 on admission and has normalized today with IV fluids. -Continue LR infusion  Alcoholic liver cirrhosis with ongoing alcohol use Patient followed by [facility] in [place]. Patient presented yesterday with mild transaminitis that is associated. No significant anasarca and albumin from yesterday was 2.6. CIWA score is 2. -Continue to follow-up outpatient -CIWA protocol for alcohol use and monitor for withdrawal symptoms   Gallbladder polyp -Following up with [facility] outpatient -Will not plan to address while inpatient unless significant symptomatology arises   Anemia of chronic disease Likely related to chronic medical issues of CKD and cirrhosis. Hgb currently stable without overt signs of bleeding. -Monitor CBC  Hypomagnesemia Mg is 1.4. 2 g IV MgSO4 given today. -Continue to monitor   COPD with Ongoing Tobacco Abuse Nicotine patch. Patient without acute bronchospasms and was counseled on importance of cessation. -Continue Dulera BID and Proventil PRN      LOS: 1 day   Stanford Breed, Medical Student 03/03/2022, 10:30 AM

## 2022-03-03 NOTE — TOC Initial Note (Signed)
  Transition of Care [facility]) Screening Note   Patient Details  Name: [name] [name] Date of Birth: 01-27-1966   Transition of Care [facility]) CM/SW Contact:    Boneta Lucks, RN Phone Number: 03/03/2022, 11:46 AM    Transition of Care Department [facility]) has reviewed patient and no TOC needs have been identified at this time. We will continue to monitor patient advancement through interdisciplinary progression rounds. If new patient transition needs arise, please place a TOC consult.    Expected Discharge Plan: Home/Self Care Barriers to Discharge: Continued Medical Work up, No Barriers Identified     Expected Discharge Plan and Services Expected Discharge Plan: Home/Self Care      Activities of Daily Living Home Assistive Devices/Equipment: None ADL Screening (condition at time of admission) Patient's cognitive ability adequate to safely complete daily activities?: Yes Is the patient deaf or have difficulty hearing?: No Does the patient have difficulty seeing, even when wearing glasses/contacts?: No Does the patient have difficulty concentrating, remembering, or making decisions?: No Patient able to express need for assistance with ADLs?: Yes Does the patient have difficulty dressing or bathing?: No Independently performs ADLs?: Yes (appropriate for developmental age) Does the patient have difficulty walking or climbing stairs?: No Weakness of Legs: None Weakness of Arms/Hands: None     Admission diagnosis:  Alcoholic cirrhosis of liver with ascites ([place]) [K70.31] AKI (acute kidney injury) ([place]) [N17.9] Patient Active Problem List   Diagnosis Date Noted   Upper abdominal pain 08/11/2021   AKI (acute kidney injury) ([place]) 08/11/2021   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   Elevated ferritin 08/26/2020   Hypomagnesemia 08/11/2020   Hypoalbuminemia 08/11/2020   Hypotension 08/11/2020   Anemia of chronic disease 08/11/2020   DVT (deep venous thrombosis) -Lt  LL Extensive DVT 86/76/1950   Alcoholic cirrhosis of liver with ascites ([place]) 08/05/2020   COPD (chronic obstructive pulmonary disease) ([place]) 08/05/2020   Hypokalemia 08/05/2020   Cirrhosis of liver without ascites ([place]) 08/04/2020   Edema of left lower extremity 08/04/2020   Loss of weight 07/07/2020   Loss of appetite 07/07/2020   Anasarca 07/07/2020   Heme + stool 01/22/2017   Abnormal LFTs 01/22/2017   Hyperlipidemia 02/08/2015   Cigarette nicotine dependence with nicotine-induced disorder 02/08/2015   Leukocytosis, unspecified 01/07/2013   Hyponatremia 01/07/2013   Kidney stone 93/26/7124   Ureteral colic 58/12/9831   PCP:  Carrolyn Meiers, MD Pharmacy:   [facility], [address] [place] [address] Phone: [phone] Fax: [phone]

## 2022-03-04 DIAGNOSIS — N179 Acute kidney failure, unspecified: Secondary | ICD-10-CM | POA: Diagnosis not present

## 2022-03-04 LAB — BASIC METABOLIC PANEL
Anion gap: 5 (ref 5–15)
BUN: 31 mg/dL — ABNORMAL HIGH (ref 6–20)
CO2: 22 mmol/L (ref 22–32)
Calcium: 7.8 mg/dL — ABNORMAL LOW (ref 8.9–10.3)
Chloride: 109 mmol/L (ref 98–111)
Creatinine, Ser: 1.87 mg/dL — ABNORMAL HIGH (ref 0.61–1.24)
GFR, Estimated: 42 mL/min — ABNORMAL LOW (ref >60)
Glucose, Bld: 142 mg/dL — ABNORMAL HIGH (ref 70–99)
Potassium: 3.7 mmol/L (ref 3.5–5.1)
Sodium: 136 mmol/L (ref 135–145)

## 2022-03-04 MED ORDER — VITAMIN B-1 100 MG PO TABS: 100.0000 mg | ORAL_TABLET | Freq: Every day | ORAL | 0 refills | Status: DC

## 2022-03-04 MED ORDER — ADULT MULTIVITAMIN W/MINERALS CH: 1.0000 | ORAL_TABLET | Freq: Every day | ORAL | 0 refills | Status: DC

## 2022-03-04 MED ORDER — SODIUM BICARBONATE 650 MG PO TABS: 650.0000 mg | ORAL_TABLET | Freq: Two times a day (BID) | ORAL | 0 refills | Status: AC

## 2022-03-04 NOTE — Discharge Summary (Signed)
Physician Discharge Summary  [name] [name] RDE:[id number] DOB: 1965/10/28 DOA: 03/02/2022  PCP: Carrolyn Meiers, MD  Admit date: 03/02/2022  Discharge date: 03/04/2022  Admitted From:Home  Disposition:  Home  Recommendations for Outpatient Follow-up:  Follow up with PCP in 1-2 weeks Follow-up with Dr. Hollie Salk, nephrologist which will be scheduled Continue home medications as prior with addition of sodium bicarbonate as noted below Counseled on cessation of alcohol use and tobacco use  Home Health: None  Equipment/Devices:None  Discharge Condition:Stable  CODE STATUS: Full  Diet recommendation: Heart Healthy  Brief/Interim Summary:  [name] [name] is a 56 year old male with a history of alcoholic cirrhosis of the liver, COPD, prior kidney stones, prior DVT, chronic tobacco use, and anemia of chronic disease who presented to the ED today due to elevated creatinine. Patient had a follow-up appointment yesterday with [facility] in [place] to address alcoholic cirrhosis of the liver with ascites, portal hypertension, and a gallbladder polyp. Labs from this appointment revealed a creatinine of 2.98 so patient was told by PCP to come to the ED. he was otherwise asymptomatic and was admitted for AKI on CKD stage IIIa with mild metabolic acidosis.  This appears to be prerenal in nature and he was given IV fluid with improvement in his creatinine levels which are nearing his usual baseline of 1.4-1.5.  He is currently at creatinine of 1.8 and is in stable condition for discharge with close follow-up to nephrology as noted.  He may resume his home diuretics and sodium bicarbonate has been added to help with metabolic acidosis.  He has been encouraged to discontinue alcohol and tobacco use.  No other acute events noted during the course of this admission.  Discharge Diagnoses:  Principal Problem:   AKI (acute kidney injury) ([place]) Active Problems:   Alcoholic  cirrhosis of liver with ascites (HCC)   COPD (chronic obstructive pulmonary disease) (HCC)   Cigarette nicotine dependence with nicotine-induced disorder   Anemia of chronic disease  Principal discharge diagnosis: Prerenal AKI on CKD stage IIIa with mild metabolic acidosis.  Discharge Instructions  Discharge Instructions     Diet - low sodium heart healthy   Complete by: As directed    Increase activity slowly   Complete by: As directed       Allergies as of 03/04/2022   No Known Allergies      Medication List     TAKE these medications    acetaminophen 500 MG tablet Commonly known as: TYLENOL Take 1,000 mg by mouth every 6 (six) hours as needed for moderate pain.   albuterol 108 (90 Base) MCG/ACT inhaler Commonly known as: Proventil HFA INHALE 2 PUFFS BY MOUTH EVERY 6 HOURS AS NEEDED FOR COUGHING, WHEEZING, OR SHORTNESS OF BREATH   aspirin 81 MG chewable tablet Chew 81 mg by mouth daily.   Dulera 200-5 MCG/ACT Aero Generic drug: mometasone-formoterol Inhale 2 puffs into the lungs 2 (two) times daily.   folic acid 631 MCG tablet Commonly known as: V-R FOLIC ACID Take 1 tablet (400 mcg total) by mouth daily.   furosemide 20 MG tablet Commonly known as: LASIX Take 2 tablets in morning and 1 tablet in the afternoon. (40 milligrams in morning and 20 milligrams in afternoon).   metoprolol tartrate 25 MG tablet Commonly known as: LOPRESSOR Take 25 mg by mouth 2 (two) times daily.   multivitamin with minerals Tabs tablet Take 1 tablet by mouth daily. Start taking on: March 05, 2022  pantoprazole 40 MG tablet Commonly known as: Protonix Take 1 tablet (40 mg total) by mouth daily.   sodium bicarbonate 650 MG tablet Take 1 tablet (650 mg total) by mouth 2 (two) times daily.   spironolactone 50 MG tablet Commonly known as: ALDACTONE Take 50-100 mg by mouth See admin instructions. Take 100 mg by mouth in morning and 50 mg in the afternoon   thiamine 100 MG  tablet Commonly known as: Vitamin B-1 Take 1 tablet (100 mg total) by mouth daily. Start taking on: March 05, 2022        Follow-up Information     Fanta, Normajean Baxter, MD. Schedule an appointment as soon as possible for a visit in 1 week(s).   Specialty: Internal Medicine Contact information: [place] [address] [phone]         Madelon Lips, MD. Go to.   Specialty: Nephrology Contact information: [place] [address] [phone]                No Known Allergies  Consultations: None   Procedures/Studies: US RENAL  Result Date: 03/02/2022 CLINICAL DATA:  Acute kidney injury, left renal atrophy EXAM: RENAL / URINARY TRACT ULTRASOUND COMPLETE COMPARISON:  MRI abdomen 06/29/2021 FINDINGS: Right Kidney: Renal measurements: 14.2 by 6.0 by 7.3 cm = volume: 324 mL. Echogenicity within normal limits. No mass or hydronephrosis visualized. Left Kidney: Renal measurements: 7.8 by 3.5 by 4.6 cm = volume: 66 mL. Echogenicity within normal limits. No mass or hydronephrosis visualized. Bladder: Sessile polypoid irregularity posteriorly in the urinary bladder wall example on image 29 series 1, measuring about 1.6 by 0.8 cm. Other: None. IMPRESSION: 1. Sessile polypoid irregularity posteriorly in the urinary bladder wall. Cannot exclude neoplasm such as transitional cell carcinoma. Cystoscopy may be helpful for further evaluation. 2. Chronically atrophic left kidney. 3. No hydronephrosis. Electronically Signed   By: Van Clines M.D.   On: 03/02/2022 15:30   US ABDOMEN LIMITED RUQ (LIVER/GB)  Result Date: 02/22/2022 CLINICAL DATA:  Cirrhosis follow-up. EXAM: ULTRASOUND ABDOMEN LIMITED RIGHT UPPER QUADRANT COMPARISON:  None Available. FINDINGS: Gallbladder: The non mobile oval echogenic region is identified in the gallbladder measuring up to 13 mm. This abnormality measured 8 mm previously. The gallbladder wall is otherwise  normal with no stones, sludge, pericholecystic fluid, wall thickening, or Murphy's sign. Common bile duct: Diameter: 6 mm Liver: Mildly nodular contour. Increased heterogeneous echogenicity. Portal vein is patent on color Doppler imaging with normal direction of blood flow towards the liver. Other: None. IMPRESSION: 1. The non mobile oval echogenic region in the gallbladder measuring 13 mm is larger than on the previous study when it measured 8 mm. This could represent a polyp, sludge, or a nonshadowing stone. Recommend surgical consultation given the possibility of a polyp measuring greater than 1 cm. 2. The common bile duct is normal in caliber. 3. The mildly nodular hepatic contour and the increased heterogeneous echogenicity are consistent with the history of cirrhosis. No liver mass identified. Electronically Signed   By: Dorise Bullion III M.D.   On: 02/22/2022 09:48     Discharge Exam: Vitals:   03/04/22 0756 03/04/22 0822  BP:  135/72  Pulse:  69  Resp:    Temp:    SpO2: 96%    Vitals:   03/04/22 0300 03/04/22 0527 03/04/22 0756 03/04/22 0822  BP: 138/74 136/80  135/72  Pulse: 72 66  69  Resp:  19    Temp:  97.9  F (36.6 C)    TempSrc:  Oral    SpO2:  99% 96%   Weight:      Height:        General: Pt is alert, awake, not in acute distress Cardiovascular: RRR, S1/S2 +, no rubs, no gallops Respiratory: CTA bilaterally, no wheezing, no rhonchi Abdominal: Soft, NT, ND, bowel sounds + Extremities: no edema, no cyanosis    The results of significant diagnostics from this hospitalization (including imaging, microbiology, ancillary and laboratory) are listed below for reference.     Microbiology: Recent Results (from the past 240 hour(s))  Urine Culture     Status: Abnormal (Preliminary result)   Collection Time: 03/02/22  1:27 PM   Specimen: Urine, Clean Catch  Result Value Ref Range Status   Specimen Description   Final    URINE, CLEAN CATCH Performed at [facility], [address] [place], [place] [address]    Special Requests   Final    NONE Performed at [facility], [address] [place], [place] [address]    Culture (A)  Final    >=100,000 COLONIES/mL GRAM POSITIVE COCCI CULTURE REINCUBATED FOR BETTER GROWTH Performed at [facility], [place] [address] [place], [place] [address]    Report Status PENDING  Incomplete     Labs: BNP (last 3 results) No results for input(s): "BNP" in the last 8760 hours. Basic Metabolic Panel: Recent Labs  Lab 03/02/22 1326 03/02/22 1639 03/03/22 0431 03/03/22 1559 03/04/22 0927  NA 133*  --  137 135 136  K 3.6  --  4.1 3.8 3.7  CL 101  --  109 108 109  CO2 21*  --  19* 21* 22  GLUCOSE 76  --  100* 123* 142*  BUN 43*  --  45* 41* 31*  CREATININE 2.88* 2.97* 2.41* 2.44* 1.87*  CALCIUM 7.6*  --  7.3* 7.5* 7.8*  MG  --   --  1.4* 2.0  --    Liver Function Tests: Recent Labs  Lab 03/02/22 1326  AST 42*  ALT 15  ALKPHOS 152*  BILITOT 0.3  PROT 7.8  ALBUMIN 2.6*   Recent Labs  Lab 03/02/22 1326  LIPASE 91*   No results for input(s): "AMMONIA" in the last 168 hours. CBC: Recent Labs  Lab 03/02/22 1326 03/02/22 1639 03/03/22 0431 03/03/22 1559  WBC 10.1 8.6 6.8 6.6  NEUTROABS 6.3  --   --   --   HGB 12.7* 12.3* 11.8* 11.3*  HCT 37.8* 36.7* 35.0* 33.9*  MCV 94.7 95.8 95.6 96.0  PLT 151 151 137* 123*   Cardiac Enzymes: No results for input(s): "CKTOTAL", "CKMB", "CKMBINDEX", "TROPONINI" in the last 168 hours. BNP: Invalid input(s): "POCBNP" CBG: No results for input(s): "GLUCAP" in the last 168 hours. D-Dimer No results for input(s): "DDIMER" in the last 72 hours. Hgb A1c No results for input(s): "HGBA1C" in the last 72 hours. Lipid Profile No results for input(s): "CHOL", "HDL", "LDLCALC", "TRIG", "CHOLHDL", "LDLDIRECT" in the last 72 hours. Thyroid function studies No results for input(s): "TSH", "T4TOTAL", "T3FREE", "THYROIDAB" in the last 72 hours.  Invalid  input(s): "FREET3" Anemia work up No results for input(s): "VITAMINB12", "FOLATE", "FERRITIN", "TIBC", "IRON", "RETICCTPCT" in the last 72 hours. Urinalysis    Component Value Date/Time   COLORURINE YELLOW 03/02/2022 1430   APPEARANCEUR HAZY (A) 03/02/2022 1430   LABSPEC 1.011 03/02/2022 1430   PHURINE 6.0 03/02/2022 1430   GLUCOSEU NEGATIVE 03/02/2022 1430   HGBUR  NEGATIVE 03/02/2022 1430   BILIRUBINUR NEGATIVE 03/02/2022 1430   KETONESUR NEGATIVE 03/02/2022 1430   PROTEINUR >=300 (A) 03/02/2022 1430   UROBILINOGEN 0.2 11/02/2013 0816   NITRITE NEGATIVE 03/02/2022 1430   LEUKOCYTESUR MODERATE (A) 03/02/2022 1430   Sepsis Labs Recent Labs  Lab 03/02/22 1326 03/02/22 1639 03/03/22 0431 03/03/22 1559  WBC 10.1 8.6 6.8 6.6   Microbiology Recent Results (from the past 240 hour(s))  Urine Culture     Status: Abnormal (Preliminary result)   Collection Time: 03/02/22  1:27 PM   Specimen: Urine, Clean Catch  Result Value Ref Range Status   Specimen Description   Final    URINE, CLEAN CATCH Performed at [facility], [address], [place], [place] [address]    Special Requests   Final    NONE Performed at [facility], [address] [place], [place] [address]    Culture (A)  Final    >=100,000 COLONIES/mL GRAM POSITIVE COCCI CULTURE REINCUBATED FOR BETTER GROWTH Performed at [facility], [name] [address], [place], [place] [address]    Report Status PENDING  Incomplete     Time coordinating discharge: 35 minutes  SIGNED:   Rodena Goldmann, DO [facility] 03/04/2022, 11:55 AM  If 7PM-7AM, please contact night-coverage [email or web address]

## 2022-03-05 LAB — URINE CULTURE: Culture: >=100000 — AB

## 2022-03-08 ENCOUNTER — Inpatient Hospital Stay: Payer: Medicaid Other | Attending: Physician Assistant

## 2022-03-08 DIAGNOSIS — R140 Abdominal distension (gaseous): Secondary | ICD-10-CM | POA: Insufficient documentation

## 2022-03-08 DIAGNOSIS — R634 Abnormal weight loss: Secondary | ICD-10-CM | POA: Diagnosis not present

## 2022-03-08 DIAGNOSIS — R7989 Other specified abnormal findings of blood chemistry: Secondary | ICD-10-CM | POA: Diagnosis not present

## 2022-03-08 DIAGNOSIS — D649 Anemia, unspecified: Secondary | ICD-10-CM | POA: Diagnosis not present

## 2022-03-08 DIAGNOSIS — N183 Chronic kidney disease, stage 3 unspecified: Secondary | ICD-10-CM | POA: Insufficient documentation

## 2022-03-08 DIAGNOSIS — E538 Deficiency of other specified B group vitamins: Secondary | ICD-10-CM | POA: Insufficient documentation

## 2022-03-08 DIAGNOSIS — K7030 Alcoholic cirrhosis of liver without ascites: Secondary | ICD-10-CM | POA: Diagnosis not present

## 2022-03-08 DIAGNOSIS — J4489 Other specified chronic obstructive pulmonary disease: Secondary | ICD-10-CM | POA: Insufficient documentation

## 2022-03-08 DIAGNOSIS — Z86718 Personal history of other venous thrombosis and embolism: Secondary | ICD-10-CM | POA: Diagnosis not present

## 2022-03-08 DIAGNOSIS — F17210 Nicotine dependence, cigarettes, uncomplicated: Secondary | ICD-10-CM | POA: Insufficient documentation

## 2022-03-08 DIAGNOSIS — Z7982 Long term (current) use of aspirin: Secondary | ICD-10-CM | POA: Diagnosis not present

## 2022-03-08 DIAGNOSIS — Z800 Family history of malignant neoplasm of digestive organs: Secondary | ICD-10-CM | POA: Diagnosis not present

## 2022-03-08 DIAGNOSIS — I129 Hypertensive chronic kidney disease with stage 1 through stage 4 chronic kidney disease, or unspecified chronic kidney disease: Secondary | ICD-10-CM | POA: Insufficient documentation

## 2022-03-08 DIAGNOSIS — F1021 Alcohol dependence, in remission: Secondary | ICD-10-CM | POA: Insufficient documentation

## 2022-03-08 DIAGNOSIS — M7989 Other specified soft tissue disorders: Secondary | ICD-10-CM | POA: Insufficient documentation

## 2022-03-08 LAB — IRON AND TIBC
Iron: 206 ug/dL — ABNORMAL HIGH (ref 45–182)
Saturation Ratios: 86 % — ABNORMAL HIGH (ref 17.9–39.5)
TIBC: 241 ug/dL — ABNORMAL LOW (ref 250–450)
UIBC: 35 ug/dL

## 2022-03-08 LAB — CBC WITH DIFFERENTIAL/PLATELET
Abs Immature Granulocytes: 0.02 10*3/uL (ref 0.00–0.07)
Basophils Absolute: 0.1 10*3/uL (ref 0.0–0.1)
Basophils Relative: 1 %
Eosinophils Absolute: 0.2 10*3/uL (ref 0.0–0.5)
Eosinophils Relative: 2 %
HCT: 35.4 % — ABNORMAL LOW (ref 39.0–52.0)
Hemoglobin: 12 g/dL — ABNORMAL LOW (ref 13.0–17.0)
Immature Granulocytes: 0 %
Lymphocytes Relative: 30 %
Lymphs Abs: 2.2 10*3/uL (ref 0.7–4.0)
MCH: 32.6 pg (ref 26.0–34.0)
MCHC: 33.9 g/dL (ref 30.0–36.0)
MCV: 96.2 fL (ref 80.0–100.0)
Monocytes Absolute: 1 10*3/uL (ref 0.1–1.0)
Monocytes Relative: 13 %
Neutro Abs: 3.7 10*3/uL (ref 1.7–7.7)
Neutrophils Relative %: 54 %
Platelets: 290 10*3/uL (ref 150–400)
RBC: 3.68 MIL/uL — ABNORMAL LOW (ref 4.22–5.81)
RDW: 19.1 % — ABNORMAL HIGH (ref 11.5–15.5)
WBC: 7.1 10*3/uL (ref 4.0–10.5)
nRBC: 0 % (ref 0.0–0.2)

## 2022-03-08 LAB — FERRITIN: Ferritin: 544 ng/mL — ABNORMAL HIGH (ref 24–336)

## 2022-03-08 LAB — FOLATE: Folate: 4.4 ng/mL — ABNORMAL LOW (ref >5.9)

## 2022-03-08 LAB — VITAMIN B12: Vitamin B-12: 138 pg/mL — ABNORMAL LOW (ref 180–914)

## 2022-03-09 ENCOUNTER — Encounter: Payer: Self-pay | Admitting: *Deleted

## 2022-03-09 ENCOUNTER — Ambulatory Visit (INDEPENDENT_AMBULATORY_CARE_PROVIDER_SITE_OTHER): Payer: Medicaid Other | Admitting: Gastroenterology

## 2022-03-09 VITALS — BP 130/73 | HR 68 | Temp 98.1°F | Ht 72.0 in | Wt 164.2 lb

## 2022-03-09 DIAGNOSIS — K7460 Unspecified cirrhosis of liver: Secondary | ICD-10-CM

## 2022-03-09 DIAGNOSIS — Z86010 Personal history of colonic polyps: Secondary | ICD-10-CM

## 2022-03-09 LAB — HOMOCYSTEINE: Homocysteine: 42.2 umol/L — ABNORMAL HIGH (ref 0.0–14.5)

## 2022-03-09 MED ORDER — PEG 3350-KCL-NA BICARB-NACL 420 G PO SOLR: 4000.0000 mL | Freq: Once | ORAL | 0 refills | Status: AC

## 2022-03-09 NOTE — Progress Notes (Signed)
Gastroenterology Office Note     Primary Care Physician:  Carrolyn Meiers, MD  Primary Gastroenterologist: Dr. Gala Romney    Chief Complaint   Chief Complaint  Patient presents with   [facility] follow up     History of Present Illness   [name] [name] is a 56 y.o. male presenting today in follow-up with a history of cirrhosis due to ETOH, followed by Liver clinic in [place] as well Roosevelt Locks, FNP). Recent EGD May 2023 with normal esophagus but retained food in stomach and duodenum precluded exam.       Admits to drinking recently after the death of his mom. Hospitalized 11/2-11/4 with AKI. [place] in Oct 2023 with concern for gallbladder polyp measuring 13 mm. He has already been referred to [facility]. Renal ultrasound while inpatient with irregular polypoid area of the bladder.   States Hep A/B vaccinations werent covered by insurance. Has not tried Health Dept.   Lasix 40 mg in am and 20 mg in pm, spironolactone 100 mg in morning and 50 mg in afternoon. No added salt. No abdominal pain, N/V, changes in bowel habits, constipation, diarrhea, overt GI bleeding, GERD, dysphagia, unexplained weight loss, lack of appetite, unexplained weight gain. No confusion or mental status changes. No pruritus, jaundice.    Past Medical History:  Diagnosis Date   Asthma    Cirrhosis ([place])    Dyspnea    High cholesterol    History of kidney stones    Hypertension    Kidney stones     Past Surgical History:  Procedure Laterality Date   APPENDECTOMY     BIOPSY  07/22/2020   Procedure: BIOPSY;  Surgeon: Daneil Dolin, MD;  [facility];  Service: Endoscopy;;   COLONOSCOPY WITH PROPOFOL N/A 04/18/2017   non-bleeding internal hemorrhoids, two 4-6 mm polyps in descending colon and cecum, pancolonic diverticulosis, single cecal AVM. Tubular adenomas, surveillance in 2023.    ESOPHAGOGASTRODUODENOSCOPY (EGD) WITH PROPOFOL N/A 07/22/2020    normal esophagus, small hiatal hernia, abnormal gastric mucosa, abnormal appearing ampula and periampullary mucosa. Mild chronic gastritis.   ESOPHAGOGASTRODUODENOSCOPY (EGD) WITH PROPOFOL N/A 09/08/2021   normal esophagus, retained food in stomach and duodenum precluded complete examination.   FRACTURE SURGERY     left arm   IR PARACENTESIS  05/31/2021   LOWER EXTREMITY VENOGRAPHY N/A 08/10/2020   Procedure: LOWER EXTREMITY VENOGRAPHY;  Surgeon: Waynetta Sandy, MD;  Location: [place] [facility];  Service: Cardiovascular;  Laterality: N/A;   PERIPHERAL VASCULAR BALLOON ANGIOPLASTY Left 08/10/2020   Procedure: PERIPHERAL VASCULAR BALLOON ANGIOPLASTY;  Surgeon: Waynetta Sandy, MD;  Location: [place] [facility];  Service: Cardiovascular;  Laterality: Left;  lower extremity venous   PERIPHERAL VASCULAR THROMBECTOMY N/A 08/10/2020   Procedure: PERIPHERAL VASCULAR THROMBECTOMY;  Surgeon: Waynetta Sandy, MD;  Location: [place] [facility];  Service: Cardiovascular;  Laterality: N/A;   POLYPECTOMY  04/18/2017   Procedure: POLYPECTOMY;  Surgeon: Daneil Dolin, MD;  [facility];  Service: Endoscopy;;    Current Outpatient Medications  Medication Sig Dispense Refill   acetaminophen (TYLENOL) 500 MG tablet Take 1,000 mg by mouth every 6 (six) hours as needed for moderate pain.     albuterol (PROVENTIL HFA) 108 (90 Base) MCG/ACT inhaler INHALE 2 PUFFS BY MOUTH EVERY 6 HOURS AS NEEDED FOR COUGHING, WHEEZING, OR SHORTNESS OF BREATH 18 g 1   aspirin 81 MG chewable tablet  Chew 81 mg by mouth daily.     furosemide (LASIX) 20 MG tablet Take 2 tablets in morning and 1 tablet in the afternoon. (40 milligrams in morning and 20 milligrams in afternoon). 90 tablet 3   metoprolol tartrate (LOPRESSOR) 25 MG tablet Take 25 mg by mouth 2 (two) times daily.     mometasone-formoterol (DULERA) 200-5 MCG/ACT AERO Inhale 2 puffs into the lungs 2 (two) times daily. 13 g 3    pantoprazole (PROTONIX) 40 MG tablet Take 1 tablet (40 mg total) by mouth daily. 90 tablet 3   sodium bicarbonate 650 MG tablet Take 1 tablet (650 mg total) by mouth 2 (two) times daily. 60 tablet 0   spironolactone (ALDACTONE) 50 MG tablet Take 50-100 mg by mouth See admin instructions. Take 100 mg by mouth in morning and 50 mg in the afternoon     No current facility-administered medications for this visit.    Allergies as of 03/09/2022   (No Known Allergies)    Family History  Problem Relation Age of Onset   Cancer Father        throat   Diabetes Sister    Colon cancer Neg Hx     Social History   Socioeconomic History   Marital status: Single    Spouse name: Not on file   Number of children: Not on file   Years of education: Not on file   Highest education level: Not on file  Occupational History   Not on file  Tobacco Use   Smoking status: Every Day    Packs/day: 0.50    Years: 33.00    Total pack years: 16.50    Types: Cigarettes   Smokeless tobacco: Never  Vaping Use   Vaping Use: Never used  Substance and Sexual Activity   Alcohol use: Yes    Comment: last intake today   Drug use: No   Sexual activity: Yes    Birth control/protection: None  Other Topics Concern   Not on file  Social History Narrative   Not on file   Social Determinants of Health   Financial Resource Strain: High Risk (09/13/2020)   Overall Financial Resource Strain (CARDIA)    Difficulty of Paying Living Expenses: Very hard  Food Insecurity: No Food Insecurity (09/13/2020)   Hunger Vital Sign    Worried About Running Out of Food in the Last Year: Never true    Ran Out of Food in the Last Year: Never true  Transportation Needs: No Transportation Needs (09/13/2020)   PRAPARE - Hydrologist (Medical): No    Lack of Transportation (Non-Medical): No  Physical Activity: Sufficiently Active (09/13/2020)   Exercise Vital Sign    Days of Exercise per Week: 7 days     Minutes of Exercise per Session: 30 min  Stress: No Stress Concern Present (09/13/2020)   [place]    Feeling of Stress : Only a little  Social Connections: Moderately Isolated (09/13/2020)   Social Connection and Isolation Panel [NHANES]    Frequency of Communication with Friends and Family: More than three times a week    Frequency of Social Gatherings with Friends and Family: Once a week    Attends Religious Services: More than 4 times per year    Active Member of [employer] or Organizations: No    Attends Archivist Meetings: Never    Marital Status: Never married  Intimate Partner Violence:  Not At Risk (09/13/2020)   Humiliation, Afraid, Rape, and Kick questionnaire    Fear of Current or Ex-Partner: No    Emotionally Abused: No    Physically Abused: No    Sexually Abused: No     Review of Systems   Gen: Denies any fever, chills, fatigue, weight loss, lack of appetite.  CV: Denies chest pain, heart palpitations, peripheral edema, syncope.  Resp: Denies shortness of breath at rest or with exertion. Denies wheezing or cough.  GI: see HPI GU : Denies urinary burning, urinary frequency, urinary hesitancy MS: Denies joint pain, muscle weakness, cramps, or limitation of movement.  Derm: Denies rash, itching, dry skin Psych: Denies depression, anxiety, memory loss, and confusion Heme: Denies bruising, bleeding, and enlarged lymph nodes.   Physical Exam   BP 130/73   Pulse 68   Temp 98.1 F (36.7 C)   Ht 6' (1.829 m)   Wt 164 lb 3.2 oz (74.5 kg)   BMI 22.27 kg/m  General:   Alert and oriented. Pleasant and cooperative. Well-nourished and well-developed.  Head:  Normocephalic and atraumatic. Eyes:  Without icterus Abdomen:  +BS, soft, non-tender and non-distended. Liver margin palpable below right costal margin Rectal:  Deferred  Msk:  Symmetrical without gross deformities. Normal  posture. Extremities:  Without edema. Neurologic:  Alert and  oriented x4;  grossly normal neurologically. Skin:  Intact without significant lesions or rashes. Psych:  Alert and cooperative. Normal mood and affect.  Lab Results  Component Value Date   CREATININE 1.87 (H) 03/04/2022   BUN 31 (H) 03/04/2022   NA 136 03/04/2022   K 3.7 03/04/2022   CL 109 03/04/2022   CO2 22 03/04/2022       Assessment   JAQUAVIOUS MERCER is a 56 y.o. male presenting today in follow-up with a history of cirrhosis due to ETOH, followed by Liver clinic in [place] as well Roosevelt Locks, FNP). Recent EGD May 2023 with normal esophagus but retained food in stomach and duodenum precluded exam.   Cirrhosis: recently drinking after death of his mother. Again discussed risks of continued alcohol use. Needs Hep A/B vaccinations and encouraged to go to Health Dept. Continue with current diuretic regimen. EGD up-to-date as of May 2023. [place] due in April 2024.  Gallbladder polyp: keep referral to [facility].   History of adenomas: 5-year-surveillance due now.    AKI: recently hospitalized. Discharge creatinine 1.87. Upcoming Nephrology appt. Continue Lasix 40 mg in am and 20 mg in pm, spironolactone 100 mg in am and 50 mg in pm. Recheck BMP in 1 week.   Abnormal renal ultrasound: irregular polypoid area of bladder. I do not see where this has been addressed. Refer to Urology.    PLAN    Proceed with colonoscopy by Dr. Gala Romney in near future: the risks, benefits, and alternatives have been discussed with the patient in detail. The patient states understanding and desires to proceed. ASA 3 BMP in 1 week Hep A/B vaccinations provided. [facility] for gallbladder polyp. Number provided Avoid ETOH [place] in April 2024 Urology referral  3 month follow-up   Annitta Needs, PhD, ANP-BC [facility] Gastroenterology

## 2022-03-09 NOTE — Patient Instructions (Addendum)
Please see if you can get the Hepatitis A and B vaccinations at the Health Department.  Please complete the blood work in about 1 week.   We are arranging a colonoscopy in the near future!  Please call [facility] [phone] to get an appointment to discuss removing your gallbladder.   We will see you in 3 months!   I am so sorry for your loss.  I enjoyed seeing you again today! As you know, I value our relationship and want to provide genuine, compassionate, and quality care. I welcome your feedback. If you receive a survey regarding your visit,  I greatly appreciate you taking time to fill this out. See you next time!  Annitta Needs, PhD, ANP-BC [facility] Gastroenterology

## 2022-03-10 ENCOUNTER — Encounter: Payer: Self-pay | Admitting: *Deleted

## 2022-03-11 LAB — METHYLMALONIC ACID, SERUM: Methylmalonic Acid, Quantitative: 447 nmol/L — ABNORMAL HIGH (ref 0–378)

## 2022-03-13 ENCOUNTER — Ambulatory Visit (INDEPENDENT_AMBULATORY_CARE_PROVIDER_SITE_OTHER): Payer: Medicaid Other | Admitting: Podiatry

## 2022-03-13 ENCOUNTER — Encounter: Payer: Self-pay | Admitting: Podiatry

## 2022-03-13 DIAGNOSIS — M21622 Bunionette of left foot: Secondary | ICD-10-CM

## 2022-03-13 DIAGNOSIS — M21621 Bunionette of right foot: Secondary | ICD-10-CM

## 2022-03-13 DIAGNOSIS — M2041 Other hammer toe(s) (acquired), right foot: Secondary | ICD-10-CM

## 2022-03-13 NOTE — Progress Notes (Signed)
Subjective:   Patient ID: [name], male   DOB: 56 y.o.   MRN: [id number]   HPI Patient states he is having a lot of pain in both feet and states he is not sure if surgery at 1 point will be necessary   ROS      Objective:  Physical Exam  Neurovascular status intact muscle strength still found to be adequate with closure  Severe keratotic lesion fifth metatarsal head of both feet that are thickened and moderately painful when pressed.  Patient has good digital perfusion well-oriented     Assessment:  Chronic lesion that is secondary to the structure of his feet with patient who probably has some underlying skin pathology     Plan:  H&P reviewed condition discussed at great length the considerations for surgical intervention do not recommend currently but may be necessary someday.  Explained this to him and educated him on what might be necessary in future

## 2022-03-14 NOTE — Progress Notes (Signed)
[facility] [place], [place] [address]   CLINIC:  Medical Oncology/Hematology  PCP:  Carrolyn Meiers, MD [place] [address] [phone]   REASON FOR VISIT:  Follow-up for elevated ferritin (H63D heterozygous) and normocytic anemia   PRIOR THERAPY: None   CURRENT THERAPY: Surveillance   INTERVAL HISTORY:  [name] [name] 56 y.o. male returns for routine follow-up of his elevated ferritin and normocytic anemia.  He was last seen by Tarri Abernethy PA-C on 09/07/2021.  Since his last visit, he was hospitalized from 03/02/2022 through 03/04/2022 due to AKI on CKD stage III.   At today's visit, he reports feeling fair.  He reports waxing and waning energy level with some chronic fatigue (energy about 80%).  He denies any abdominal pain, but does report some abdominal distention and tightness.  No abnormal joint pain or skin changes.  He denies any excessive iron intake such as iron supplementation, organ meats, or large amounts of red meat.  Patient stopped drinking alcohol in February 2022, although he has had a few mild relapses since that time; most recently he drank "1 beer" in 02-01-2023 after his mother passed away.   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   He has bilateral leg swelling, but denies any unilateral leg swelling or other signs or symptoms of current DVT or PE.  He denies any epistaxis, hematemesis, hematochezia, or melena.   He continues to have fluctuating weight, likely secondary to his intermittent paracentesis.  Overall, his weight is remaining within the same 15 pound range.  He reports decreased appetite and early satiety secondary to ascites.  He is drinking Ensure or Boost "when he can afford them."  He has 80% energy and 90% appetite. He endorses that he is maintaining a stable weight.   REVIEW OF SYSTEMS:  Review of Systems  Constitutional:  Negative  for appetite change, chills, diaphoresis, fatigue, fever and unexpected weight change.  HENT:   Negative for lump/mass and nosebleeds.   Eyes:  Negative for eye problems.  Respiratory:  Negative for cough, hemoptysis and shortness of breath.   Cardiovascular:  Negative for chest pain, leg swelling and palpitations.  Gastrointestinal:  Positive for abdominal distention. Negative for abdominal pain, blood in stool, constipation, diarrhea, nausea and vomiting.  Genitourinary:  Negative for hematuria.   Skin: Negative.   Neurological:  Positive for dizziness, headaches and numbness. Negative for light-headedness.  Hematological:  Does not bruise/bleed easily.      PAST MEDICAL/SURGICAL HISTORY:  Past Medical History:  Diagnosis Date   Asthma    Cirrhosis ([place])    Dyspnea    High cholesterol    History of kidney stones    Hypertension    Kidney stones    Past Surgical History:  Procedure Laterality Date   APPENDECTOMY     BIOPSY  07/22/2020   Procedure: BIOPSY;  Surgeon: Daneil Dolin, MD;  [facility];  Service: Endoscopy;;   COLONOSCOPY WITH PROPOFOL N/A 04/18/2017   non-bleeding internal hemorrhoids, two 4-6 mm polyps in descending colon and cecum, pancolonic diverticulosis, single cecal AVM. Tubular adenomas, surveillance in 2023.    ESOPHAGOGASTRODUODENOSCOPY (EGD) WITH PROPOFOL N/A 07/22/2020   normal esophagus, small hiatal hernia, abnormal gastric mucosa, abnormal appearing ampula and periampullary mucosa. Mild chronic gastritis.   ESOPHAGOGASTRODUODENOSCOPY (EGD) WITH PROPOFOL N/A 09/08/2021   normal esophagus, retained food in stomach and duodenum precluded  complete examination.   FRACTURE SURGERY     left arm   IR PARACENTESIS  05/31/2021   LOWER EXTREMITY VENOGRAPHY N/A 08/10/2020   Procedure: LOWER EXTREMITY VENOGRAPHY;  Surgeon: Waynetta Sandy, MD;  Location: [facility];  Service: Cardiovascular;  Laterality: N/A;   PERIPHERAL VASCULAR  BALLOON ANGIOPLASTY Left 08/10/2020   Procedure: PERIPHERAL VASCULAR BALLOON ANGIOPLASTY;  Surgeon: Waynetta Sandy, MD;  Location: [facility];  Service: Cardiovascular;  Laterality: Left;  lower extremity venous   PERIPHERAL VASCULAR THROMBECTOMY N/A 08/10/2020   Procedure: PERIPHERAL VASCULAR THROMBECTOMY;  Surgeon: Waynetta Sandy, MD;  Location: [facility];  Service: Cardiovascular;  Laterality: N/A;   POLYPECTOMY  04/18/2017   Procedure: POLYPECTOMY;  Surgeon: Daneil Dolin, MD;  [facility];  Service: Endoscopy;;     SOCIAL HISTORY:  Social History   Socioeconomic History   Marital status: Single    Spouse name: Not on file   Number of children: Not on file   Years of education: Not on file   Highest education level: Not on file  Occupational History   Not on file  Tobacco Use   Smoking status: Every Day    Packs/day: 0.50    Years: 33.00    Total pack years: 16.50    Types: Cigarettes   Smokeless tobacco: Never  Vaping Use   Vaping Use: Never used  Substance and Sexual Activity   Alcohol use: Yes    Comment: last intake today   Drug use: No   Sexual activity: Yes    Birth control/protection: None  Other Topics Concern   Not on file  Social History Narrative   Not on file   Social Determinants of Health   Financial Resource Strain: High Risk (09/13/2020)   Overall Financial Resource Strain (CARDIA)    Difficulty of Paying Living Expenses: Very hard  Food Insecurity: No Food Insecurity (09/13/2020)   Hunger Vital Sign    Worried About Running Out of Food in the Last Year: Never true    Ran Out of Food in the Last Year: Never true  Transportation Needs: No Transportation Needs (09/13/2020)   PRAPARE - Hydrologist (Medical): No    Lack of Transportation (Non-Medical): No  Physical Activity: Sufficiently Active (09/13/2020)   Exercise Vital Sign    Days of Exercise per Week: 7 days     Minutes of Exercise per Session: 30 min  Stress: No Stress Concern Present (09/13/2020)   [place]    Feeling of Stress : Only a little  Social Connections: Moderately Isolated (09/13/2020)   Social Connection and Isolation Panel [NHANES]    Frequency of Communication with Friends and Family: More than three times a week    Frequency of Social Gatherings with Friends and Family: Once a week    Attends Religious Services: More than 4 times per year    Active Member of [employer] or Organizations: No    Attends Archivist Meetings: Never    Marital Status: Never married  Intimate Partner Violence: Not At Risk (09/13/2020)   Humiliation, Afraid, Rape, and Kick questionnaire    Fear of Current or Ex-Partner: No    Emotionally Abused: No    Physically Abused: No    Sexually Abused: No    FAMILY HISTORY:  Family History  Problem Relation Age of Onset   Cancer Father  throat   Diabetes Sister    Colon cancer Neg Hx     CURRENT MEDICATIONS:  Outpatient Encounter Medications as of 03/15/2022  Medication Sig   acetaminophen (TYLENOL) 500 MG tablet Take 1,000 mg by mouth every 6 (six) hours as needed for moderate pain.   albuterol (PROVENTIL HFA) 108 (90 Base) MCG/ACT inhaler INHALE 2 PUFFS BY MOUTH EVERY 6 HOURS AS NEEDED FOR COUGHING, WHEEZING, OR SHORTNESS OF BREATH   aspirin 81 MG chewable tablet Chew 81 mg by mouth daily.   furosemide (LASIX) 20 MG tablet Take 2 tablets in morning and 1 tablet in the afternoon. (40 milligrams in morning and 20 milligrams in afternoon).   metoprolol tartrate (LOPRESSOR) 25 MG tablet Take 25 mg by mouth 2 (two) times daily.   mometasone-formoterol (DULERA) 200-5 MCG/ACT AERO Inhale 2 puffs into the lungs 2 (two) times daily.   pantoprazole (PROTONIX) 40 MG tablet Take 1 tablet (40 mg total) by mouth daily.   sodium bicarbonate 650 MG tablet Take 1 tablet (650 mg total) by mouth  2 (two) times daily.   spironolactone (ALDACTONE) 50 MG tablet Take 50-100 mg by mouth See admin instructions. Take 100 mg by mouth in morning and 50 mg in the afternoon   No facility-administered encounter medications on file as of 03/15/2022.    ALLERGIES:  No Known Allergies   PHYSICAL EXAM:  ECOG PERFORMANCE STATUS: 1 - Symptomatic but completely ambulatory  There were no vitals filed for this visit. There were no vitals filed for this visit. Physical Exam Constitutional:      Appearance: Normal appearance.  HENT:     Head: Normocephalic and atraumatic.     Mouth/Throat:     Mouth: Mucous membranes are moist.  Eyes:     Extraocular Movements: Extraocular movements intact.     Pupils: Pupils are equal, round, and reactive to light.  Cardiovascular:     Rate and Rhythm: Normal rate and regular rhythm.     Pulses: Normal pulses.     Heart sounds: Normal heart sounds.  Pulmonary:     Effort: Pulmonary effort is normal.     Breath sounds: Decreased breath sounds present.  Abdominal:     General: Bowel sounds are normal. There is distension (mild).     Palpations: Abdomen is soft.     Tenderness: There is no abdominal tenderness.  Musculoskeletal:        General: No swelling.     Right lower leg: Edema (trace) present.     Left lower leg: Edema (trace) present.  Lymphadenopathy:     Cervical: No cervical adenopathy.  Skin:    General: Skin is warm and dry.  Neurological:     General: No focal deficit present.     Mental Status: He is alert and oriented to person, place, and time.  Psychiatric:        Mood and Affect: Mood normal.        Behavior: Behavior normal.      LABORATORY DATA:  I have reviewed the labs as listed.  CBC    Component Value Date/Time   WBC 7.1 03/08/2022 1035   RBC 3.68 (L) 03/08/2022 1035   HGB 12.0 (L) 03/08/2022 1035   HCT 35.4 (L) 03/08/2022 1035   PLT 290 03/08/2022 1035   MCV 96.2 03/08/2022 1035   MCH 32.6 03/08/2022 1035    MCHC 33.9 03/08/2022 1035   RDW 19.1 (H) 03/08/2022 1035   LYMPHSABS 2.2 03/08/2022 1035  MONOABS 1.0 03/08/2022 1035   EOSABS 0.2 03/08/2022 1035   BASOSABS 0.1 03/08/2022 1035      Latest Ref Rng & Units 03/04/2022    9:27 AM 03/03/2022    3:59 PM 03/03/2022    4:31 AM  CMP  Glucose 70 - 99 mg/dL 142  123  100   BUN 6 - 20 mg/dL 31  41  45   Creatinine 0.61 - 1.24 mg/dL 1.87  2.44  2.41   Sodium 135 - 145 mmol/L 136  135  137   Potassium 3.5 - 5.1 mmol/L 3.7  3.8  4.1   Chloride 98 - 111 mmol/L 109  108  109   CO2 22 - 32 mmol/L '22  21  19   '$ Calcium 8.9 - 10.3 mg/dL 7.8  7.5  7.3     DIAGNOSTIC IMAGING:  I have independently reviewed the relevant imaging and discussed with the patient.  ASSESSMENT & PLAN: 1.  Elevated ferritin with hemosiderosis, H63D heterozygous - Previously diagnosed with alcoholic liver cirrhosis, and was found to have elevated ferritin 1,254 on 07/20/2020 (normal ferritin noted in 2018). - Genetic testing showed patient to be H63D heterozygote for hemochromatosis gene - MRI abdomen (06/29/2021): Hepatic parenchymal signal suggestive of hemosiderosis - Iron panel (08/17/2021): Ferritin 492, iron saturation 24% - Most recent iron panel (03/08/2022): Ferritin 544, iron saturation 86% - Patient follows with NP Roseanne Kaufman [facility]) as well as NP Roosevelt Locks ([facility]) - Stopped drinking alcohol in February 2022, few small relapses since that time.  Most recent alcoholic beverage x1 in September 2023 after patient's mother passed away. - Discussed extensively with Dr. Delton Coombes (supervising physician): Elevated ferritin thought most likely to be due to liver disease rather than H63D heterozygosity, since alcoholic liver disease is associated with increased iron deposition in the liver.  Additionally, H63D heterozygosity is unlikely to cause increased ferritin that would be high enough to cause primary liver damage.  Patient is not felt to be a  candidate for phlebotomy due to his anemia.  No indication for chelating agents with his current ferritin < 1,000.  However, it is somewhat difficult to determine whether elevated ferritin caused liver cirrhosis versus liver cirrhosis causing elevated ferritin.  If liver biopsy were to show worsening cirrhosis definitively caused by increased iron deposition in hepatocytes, we would certainly consider chelation therapy. - Discussed with gastroenterologist (NP Roseanne Kaufman): Agrees that cirrhosis and alcohol use are likely causing elevated ferritin, and does not suspect that elevated ferritin is playing significant role in liver disease.  Agrees with holding off on treatment protocol for elevated ferritin for the time being, especially in light of his continued drinking. - PLAN: Elevated ferritin likely due to liver disease more so than H63D heterozygosity. - Patient counseled to continue to abstain from alcohol and to avoid excessive dietary iron. - Repeat iron panel and RTC in 6 months.  2. Left lower extremity DVT - Patient being followed by vascular team for extensive left lower extremity DVT diagnosed 08/04/2020 - beginning at the level of the left external iliac vein origin and extending into the common femoral, superficial femoral, and deep femoral veins to the level of the popliteal vein - Ultrasound-guided LLE thrombectomy performed by Dr. Servando Snare of vascular surgery - Was on Eliquis since 08/04/2020 through 02/10/2021. - Eliquis discontinued on 02/10/2021 due to anemia with concern for possible GI bleed, was placed on aspirin instead by the recommendation of Dr. Donzetta Matters - PLAN: Continue aspirin  and follow-up as directed by vascular team.   3. Normocytic anemia - Intermittent anemia with Hgb ranging from 7.5 to normal over the past 12 months - Hematology work-up: SPEP and IFE showed polyclonal increase in immunoglobulins, and increased kappa/lambda light chains with normal ratio.  No M spike or  monoclonal protein apparent. Reticulocytes and erythropoietin normal.  LDH mildly elevated.  Bilirubin normal. Normal folate 7.7, but elevated homocystine 38.7 Normal B12 at 361, elevated methylmalonic acid 684 CMP with creatinine 1.77/GFR 45 (CKD stage IIIb) - EGD (07/22/2020) with normal esophagus, small hiatal hernia, abnormal gastric mucosa, abnormal appearing ampula and periampullary mucosa. Mild chronic gastritis. - EGD (09/08/2021): Limited due to retained food in stomach/duodenum, but normal esophagus without any varices per report - Recurrent anemia noted during hospitalization with Hgb 7.5 (02/10/2021) - Most recent CBC (03/08/2022): Hgb 12.0/MCV 96.2.  Additional labs show elevated ferritin as above, as well as persistent B12 and folate deficiency (see below) - He denies any overt signs of bleeding - He is symptomatic only with mild chronic fatigue - DIFFERENTIAL DIAGNOSIS favors anemia of chronic disease/CKD 3B, further complicated by T01 and folic acid deficiency as below - PLAN: S01 and folic acid supplementation as below.  Otherwise, no indication for treatment at this time, but we will continue to monitor with repeat labs and RTC in 6 months.  We will consider initiation of ESA if Hgb less than 10.0.  4.  Folate & vitamin B12 deficiency - Most recent labs (08/17/2021): Normal folate 7.7/elevated homocystine 38.7, normal B12 361/elevated methylmalonic acid 684 - Patient instructed to start taking folic acid 093 mcg and vitamin B12 500 mcg daily, but was unable to find them OTC per his report. - Most recent labs (03/08/2022) show persistent B12 deficiency (B12 138, MMA 447) and folate deficiency (folate 4.4, homocystine 42.2) - PLAN: Prescription sent for folic acid 235 mcg daily.  We will start patient on vitamin B12 injections weekly x4 weeks, followed by monthly injections.  We will recheck labs at follow-up visit in 6 months.     5.  Weight loss -Patient has significant weight  fluctuations, likely secondary to his large volume paracentesis - He is drinking Ensure or Boost "when he can afford them."  - Weight today is 158 pounds, overall stable since his last visit - PLAN: We will continue to monitor at follow-up appointments and consider additional imaging if he has ongoing unintentional weight loss.  Would also consider referral to dietitian.   6.  Other history - His PMH is also notable for COPD in addition to alcoholic liver cirrhosis and LLE DVT noted above. -The patient is a recovering alcoholic - previously drank 12 beers per day for 20 + years, quit in February 202.   - He is an everyday tobacco user, smoking 0.5 PPD for the past 30 years.   - He denies illicit drug use.   - He is on disability and lives at home with his fianc. - No family history of liver disease or blood clots.  Father had throat cancer secondary to smoking.  No family hemochromatosis history.     PLAN SUMMARY: >> Weekly B12 injections x4, followed by monthly B12 injections >> Labs in 6 months (CBC/D, CMP, ferritin, iron/TIBC, B12, MMA, folate, homocystine) >> Office visit in 6 months, 1 week after labs   All questions were answered. The patient knows to call the clinic with any problems, questions or concerns.  Medical decision making: Moderate  Time spent on  visit: I spent 20 minutes counseling the patient face to face. The total time spent in the appointment was 30 minutes and more than 50% was on counseling.   [name] Rush, PA-C  03/15/22 11:55 AM

## 2022-03-15 ENCOUNTER — Encounter: Payer: Self-pay | Admitting: Physician Assistant

## 2022-03-15 ENCOUNTER — Inpatient Hospital Stay: Payer: Medicaid Other

## 2022-03-15 ENCOUNTER — Other Ambulatory Visit: Payer: Self-pay

## 2022-03-15 ENCOUNTER — Inpatient Hospital Stay (HOSPITAL_BASED_OUTPATIENT_CLINIC_OR_DEPARTMENT_OTHER): Payer: Medicaid Other | Admitting: Physician Assistant

## 2022-03-15 VITALS — BP 169/94 | HR 69 | Temp 98.4°F | Resp 18 | Ht 72.0 in | Wt 158.1 lb

## 2022-03-15 DIAGNOSIS — D649 Anemia, unspecified: Secondary | ICD-10-CM

## 2022-03-15 DIAGNOSIS — E538 Deficiency of other specified B group vitamins: Secondary | ICD-10-CM

## 2022-03-15 DIAGNOSIS — R7989 Other specified abnormal findings of blood chemistry: Secondary | ICD-10-CM | POA: Diagnosis not present

## 2022-03-15 DIAGNOSIS — E559 Vitamin D deficiency, unspecified: Secondary | ICD-10-CM

## 2022-03-15 HISTORY — DX: Deficiency of other specified B group vitamins: E53.8

## 2022-03-15 MED ORDER — CYANOCOBALAMIN 1000 MCG/ML IJ SOLN
1000.0000 ug | Freq: Once | INTRAMUSCULAR | Status: AC
  Administered 2022-03-15: 1000 ug via INTRAMUSCULAR
  Filled 2022-03-15: qty 1

## 2022-03-15 MED ORDER — FOLIC ACID 400 MCG PO TABS: 400.0000 ug | ORAL_TABLET | Freq: Every day | ORAL | 3 refills | Status: DC

## 2022-03-15 NOTE — Patient Instructions (Signed)
[name] [name]  Discharge Instructions: Thank you for choosing [facility] to provide your oncology and hematology care.  If you have a lab appointment with the [facility], please come in thru the Main Entrance and check in at the main information desk.  Wear comfortable clothing and clothing appropriate for easy access to any Portacath or PICC line.   We strive to give you quality time with your provider. You may need to reschedule your appointment if you arrive late (15 or more minutes).  Arriving late affects you and other patients whose appointments are after yours.  Also, if you miss three or more appointments without notifying the office, you may be dismissed from the clinic at the provider's discretion.      For prescription refill requests, have your pharmacy contact our office and allow 72 hours for refills to be completed.    Today you received the Vitamin B12 injection. Vitamin B12 Injection What is this medication? Vitamin B12 (VAHY tuh min B12) prevents and treats low vitamin B12 levels in your body. It is used in people who do not get enough vitamin B12 from their diet or when their digestive tract does not absorb enough. Vitamin B12 plays an important role in maintaining the health of your nervous system and red blood cells. This medicine may be used for other purposes; ask your health care provider or pharmacist if you have questions. COMMON BRAND NAME(S): B-12 Compliance Kit, B-12 Injection Kit, Cyomin, Dodex, LA-12, Nutri-Twelve, Physicians EZ Use B-12, Primabalt What should I tell my care team before I take this medication? They need to know if you have any of these conditions: Kidney disease Leber's disease Megaloblastic anemia An unusual or allergic reaction to cyanocobalamin, cobalt, other medications, foods, dyes, or preservatives Pregnant or trying to get pregnant Breast-feeding How should I use this medication? This medication is  injected into a muscle or deeply under the skin. It is usually given in a clinic or care team's office. However, your care team may teach you how to inject yourself. Follow all instructions. Talk to your care team about the use of this medication in children. Special care may be needed. Overdosage: If you think you have taken too much of this medicine contact a poison control center or emergency room at once. NOTE: This medicine is only for you. Do not share this medicine with others. What if I miss a dose? If you are given your dose at a clinic or care team's office, call to reschedule your appointment. If you give your own injections, and you miss a dose, take it as soon as you can. If it is almost time for your next dose, take only that dose. Do not take double or extra doses. What may interact with this medication? Alcohol Colchicine This list may not describe all possible interactions. Give your health care provider a list of all the medicines, herbs, non-prescription drugs, or dietary supplements you use. Also tell them if you smoke, drink alcohol, or use illegal drugs. Some items may interact with your medicine. What should I watch for while using this medication? Visit your care team regularly. You may need blood work done while you are taking this medication. You may need to follow a special diet. Talk to your care team. Limit your alcohol intake and avoid smoking to get the best benefit. What side effects may I notice from receiving this medication? Side effects that you should report to your care team  as soon as possible: Allergic reactions--skin rash, itching, hives, swelling of the face, lips, tongue, or throat Swelling of the ankles, hands, or feet Trouble breathing Side effects that usually do not require medical attention (report to your care team if they continue or are bothersome): Diarrhea This list may not describe all possible side effects. Call your doctor for medical advice  about side effects. You may report side effects to FDA at 1-800-FDA-1088. Where should I keep my medication? Keep out of the reach of children. Store at room temperature between 15 and 30 degrees C (59 and 85 degrees F). Protect from light. Throw away any unused medication after the expiration date. NOTE: This sheet is a summary. It may not cover all possible information. If you have questions about this medicine, talk to your doctor, pharmacist, or health care provider.  2023 Elsevier/Gold Standard (2007-06-08 00:00:00)      To help prevent nausea and vomiting after your treatment, we encourage you to take your nausea medication as directed.  BELOW ARE SYMPTOMS THAT SHOULD BE REPORTED IMMEDIATELY: *FEVER GREATER THAN 100.4 F (38 C) OR HIGHER *CHILLS OR SWEATING *NAUSEA AND VOMITING THAT IS NOT CONTROLLED WITH YOUR NAUSEA MEDICATION *UNUSUAL SHORTNESS OF BREATH *UNUSUAL BRUISING OR BLEEDING *URINARY PROBLEMS (pain or burning when urinating, or frequent urination) *BOWEL PROBLEMS (unusual diarrhea, constipation, pain near the anus) TENDERNESS IN MOUTH AND THROAT WITH OR WITHOUT PRESENCE OF ULCERS (sore throat, sores in mouth, or a toothache) UNUSUAL RASH, SWELLING OR PAIN  UNUSUAL VAGINAL DISCHARGE OR ITCHING   Items with * indicate a potential emergency and should be followed up as soon as possible or go to the Emergency Department if any problems should occur.  Please show the CHEMOTHERAPY ALERT CARD or IMMUNOTHERAPY ALERT CARD at check-in to the Emergency Department and triage nurse.  Should you have questions after your visit or need to cancel or reschedule your appointment, please contact [facility] [phone]  and follow the prompts.  Office hours are 8:00 a.m. to 4:30 p.m. Monday - Friday. Please note that voicemails left after 4:00 p.m. may not be returned until the following business day.  We are closed weekends and major holidays. You have access to a  nurse at all times for urgent questions. Please call the main number to the clinic [phone] and follow the prompts.  For any non-urgent questions, you may also contact your provider using MyChart. We now offer e-Visits for anyone 89 and older to request care online for non-urgent symptoms. For details visit [email or web address].   Also download the MyChart app! Go to the app store, search "MyChart", open the app, select [facility], and log in with your MyChart username and password.  Masks are optional in the cancer centers. If you would like for your care team to wear a mask while they are taking care of you, please let them know. You may have one support person who is at least 56 years old accompany you for your appointments.

## 2022-03-15 NOTE — Progress Notes (Signed)
[name] [name] presents today for injection per the provider's orders.  Vitamin B12 injection administration without incident; injection site WNL; see MAR for injection details.  Patient tolerated procedure well and without incident. Patient remained stable throughout. No questions or complaints noted at this time. Patient discharged ambulatory and in stable condition.

## 2022-03-15 NOTE — Patient Instructions (Signed)
[name] [name] at [facility] Discharge Instructions  You were seen today by Tarri Abernethy PA-C for your anemia and your high iron levels.  Your anemia is looking better!  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  Continue to see your GI doctor and your liver doctor.  You have some low vitamins and minerals.  Start taking... folic acid 161 mcg daily.  We will start you on vitamin B12 injections (once a week x4 weeks, then once a month)  FOLLOW-UP APPOINTMENT: Repeat labs in 6 months with office visit the week after labs  ** Thank you for trusting me with your healthcare!  I strive to provide all of my patients with quality care at each visit.  If you receive a survey for this visit, I would be so grateful to you for taking the time to provide feedback.  Thank you in advance!  ~ Anders Hohmann                   Dr. Derek Jack   &   Tarri Abernethy, PA-C   - - - - - - - - - - - - - - - - - -     Thank you for choosing [place] at [facility] to provide your oncology and hematology care.  To afford each patient quality time with our provider, please arrive at least 15 minutes before your scheduled appointment time.   If you have a lab appointment with the [place] please come in thru the Main Entrance and check in at the main information desk.  You need to re-schedule your appointment should you arrive 10 or more minutes late.  We strive to give you quality time with our providers, and arriving late affects you and other patients whose appointments are after yours.  Also, if you no show three or more times for appointments you may be dismissed from the clinic at the providers discretion.     Again, thank you for choosing [facility].  Our hope is that these requests will decrease the amount of time that you wait before being seen by our physicians.        _____________________________________________________________  Should you have questions after your visit to [facility], please contact our office at [phone] and follow the prompts.  Our office hours are 8:00 a.m. and 4:30 p.m. Monday - Friday.  Please note that voicemails left after 4:00 p.m. may not be returned until the following business day.  We are closed weekends and major holidays.  You do have access to a nurse 24-7, just call the main number to the clinic [phone] and do not press any options, hold on the line and a nurse will answer the phone.    For prescription refill requests, have your pharmacy contact our office and allow 72 hours.    Due to Covid, you will need to wear a mask upon entering the hospital. If you do not have a mask, a mask will be given to you at the Main Entrance upon arrival. For doctor visits, patients may have 1 support person age 79 or older with them. For treatment visits, patients can not have anyone with them due to social distancing guidelines and our immunocompromised population.

## 2022-03-20 ENCOUNTER — Telehealth: Payer: Self-pay | Admitting: Gastroenterology

## 2022-03-20 NOTE — Telephone Encounter (Signed)
Mindy/Tammy:  During recent hospitalization, patient had an  irregular polypoid area of bladder on renal ultrasound. I do not see where this has been addressed. Please refer to Urology. Thanks!

## 2022-03-21 ENCOUNTER — Other Ambulatory Visit: Payer: Self-pay | Admitting: *Deleted

## 2022-03-21 DIAGNOSIS — R9389 Abnormal findings on diagnostic imaging of other specified body structures: Secondary | ICD-10-CM

## 2022-03-21 NOTE — Telephone Encounter (Signed)
Referral sent ti urology

## 2022-03-22 ENCOUNTER — Inpatient Hospital Stay: Payer: Medicaid Other

## 2022-03-22 VITALS — BP 144/86 | HR 80 | Temp 98.3°F | Resp 16

## 2022-03-22 DIAGNOSIS — E538 Deficiency of other specified B group vitamins: Secondary | ICD-10-CM

## 2022-03-22 MED ORDER — CYANOCOBALAMIN 1000 MCG/ML IJ SOLN
1000.0000 ug | Freq: Once | INTRAMUSCULAR | Status: AC
  Administered 2022-03-22: 1000 ug via INTRAMUSCULAR
  Filled 2022-03-22: qty 1

## 2022-03-22 NOTE — Patient Instructions (Signed)
[facility] [place]  Discharge Instructions: Thank you for choosing [facility] to provide your oncology and hematology care.  If you have a lab appointment with the Cancer Center, please come in thru the Main Entrance and check in at the main information desk.  Wear comfortable clothing and clothing appropriate for easy access to any Portacath or PICC line.   We strive to give you quality time with your provider. You may need to reschedule your appointment if you arrive late (15 or more minutes).  Arriving late affects you and other patients whose appointments are after yours.  Also, if you miss three or more appointments without notifying the office, you may be dismissed from the clinic at the provider's discretion.      For prescription refill requests, have your pharmacy contact our office and allow 72 hours for refills to be completed.     To help prevent nausea and vomiting after your treatment, we encourage you to take your nausea medication as directed.  BELOW ARE SYMPTOMS THAT SHOULD BE REPORTED IMMEDIATELY: *FEVER GREATER THAN 100.4 F (38 C) OR HIGHER *CHILLS OR SWEATING *NAUSEA AND VOMITING THAT IS NOT CONTROLLED WITH YOUR NAUSEA MEDICATION *UNUSUAL SHORTNESS OF BREATH *UNUSUAL BRUISING OR BLEEDING *URINARY PROBLEMS (pain or burning when urinating, or frequent urination) *BOWEL PROBLEMS (unusual diarrhea, constipation, pain near the anus) TENDERNESS IN MOUTH AND THROAT WITH OR WITHOUT PRESENCE OF ULCERS (sore throat, sores in mouth, or a toothache) UNUSUAL RASH, SWELLING OR PAIN  UNUSUAL VAGINAL DISCHARGE OR ITCHING   Items with * indicate a potential emergency and should be followed up as soon as possible or go to the Emergency Department if any problems should occur.  Please show the CHEMOTHERAPY ALERT CARD or IMMUNOTHERAPY ALERT CARD at check-in to the Emergency Department and triage nurse.  Should you have questions after your visit or need to  cancel or reschedule your appointment, please contact [facility] [place] [phone]  and follow the prompts.  Office hours are 8:00 a.m. to 4:30 p.m. Monday - Friday. Please note that voicemails left after 4:00 p.m. may not be returned until the following business day.  We are closed weekends and major holidays. You have access to a nurse at all times for urgent questions. Please call the main number to the clinic [phone] and follow the prompts.  For any non-urgent questions, you may also contact your provider using MyChart. We now offer e-Visits for anyone 18 and older to request care online for non-urgent symptoms. For details visit [email or web address].   Also download the MyChart app! Go to the app store, search "MyChart", open the app, select [place], and log in with your MyChart username and password.  Masks are optional in the cancer centers. If you would like for your care team to wear a mask while they are taking care of you, please let them know. You may have one support person who is at least 56 years old accompany you for your appointments.  

## 2022-03-22 NOTE — Progress Notes (Signed)
Patient tolerated injection with no complaints voiced.  Site clean and dry with no bruising or swelling noted at site.  See MAR for details.  Band aid applied.  Patient stable during and after injection.  Vss with discharge and left in satisfactory condition with no s/s of distress noted.  

## 2022-03-23 LAB — BASIC METABOLIC PANEL
BUN/Creatinine Ratio: 9 (calc) (ref 6–22)
BUN: 18 mg/dL (ref 7–25)
CO2: 24 mmol/L (ref 20–32)
Calcium: 7.7 mg/dL — ABNORMAL LOW (ref 8.6–10.3)
Chloride: 102 mmol/L (ref 98–110)
Creat: 2.01 mg/dL — ABNORMAL HIGH (ref 0.70–1.30)
Glucose, Bld: 116 mg/dL — ABNORMAL HIGH (ref 65–99)
Potassium: 3.9 mmol/L (ref 3.5–5.3)
Sodium: 137 mmol/L (ref 135–146)

## 2022-03-29 ENCOUNTER — Inpatient Hospital Stay: Payer: Medicaid Other

## 2022-03-29 VITALS — BP 154/87 | HR 71 | Temp 98.0°F | Resp 18

## 2022-03-29 DIAGNOSIS — E538 Deficiency of other specified B group vitamins: Secondary | ICD-10-CM | POA: Diagnosis not present

## 2022-03-29 MED ORDER — CYANOCOBALAMIN 1000 MCG/ML IJ SOLN
1000.0000 ug | Freq: Once | INTRAMUSCULAR | Status: AC
  Administered 2022-03-29: 1000 ug via INTRAMUSCULAR
  Filled 2022-03-29: qty 1

## 2022-03-29 NOTE — Patient Instructions (Signed)
[place]  Discharge Instructions: Thank you for choosing [facility] to provide your oncology and hematology care.  If you have a lab appointment with the [facility], please come in thru the Main Entrance and check in at the main information desk.  Wear comfortable clothing and clothing appropriate for easy access to any Portacath or PICC line.   We strive to give you quality time with your provider. You may need to reschedule your appointment if you arrive late (15 or more minutes).  Arriving late affects you and other patients whose appointments are after yours.  Also, if you miss three or more appointments without notifying the office, you may be dismissed from the clinic at the provider's discretion.      For prescription refill requests, have your pharmacy contact our office and allow 72 hours for refills to be completed.    Today you received your b12 injection.      To help prevent nausea and vomiting after your treatment, we encourage you to take your nausea medication as directed.  BELOW ARE SYMPTOMS THAT SHOULD BE REPORTED IMMEDIATELY: *FEVER GREATER THAN 100.4 F (38 C) OR HIGHER *CHILLS OR SWEATING *NAUSEA AND VOMITING THAT IS NOT CONTROLLED WITH YOUR NAUSEA MEDICATION *UNUSUAL SHORTNESS OF BREATH *UNUSUAL BRUISING OR BLEEDING *URINARY PROBLEMS (pain or burning when urinating, or frequent urination) *BOWEL PROBLEMS (unusual diarrhea, constipation, pain near the anus) TENDERNESS IN MOUTH AND THROAT WITH OR WITHOUT PRESENCE OF ULCERS (sore throat, sores in mouth, or a toothache) UNUSUAL RASH, SWELLING OR PAIN  UNUSUAL VAGINAL DISCHARGE OR ITCHING   Items with * indicate a potential emergency and should be followed up as soon as possible or go to the Emergency Department if any problems should occur.  Please show the CHEMOTHERAPY ALERT CARD or IMMUNOTHERAPY ALERT CARD at check-in to the Emergency Department and triage nurse.  Should you  have questions after your visit or need to cancel or reschedule your appointment, please contact [facility] [phone]  and follow the prompts.  Office hours are 8:00 a.m. to 4:30 p.m. Monday - Friday. Please note that voicemails left after 4:00 p.m. may not be returned until the following business day.  We are closed weekends and major holidays. You have access to a nurse at all times for urgent questions. Please call the main number to the clinic [phone] and follow the prompts.  For any non-urgent questions, you may also contact your provider using MyChart. We now offer e-Visits for anyone 3 and older to request care online for non-urgent symptoms. For details visit [email or web address].   Also download the MyChart app! Go to the app store, search "MyChart", open the app, select [place], and log in with your MyChart username and password.  Masks are optional in the cancer centers. If you would like for your care team to wear a mask while they are taking care of you, please let them know. You may have one support person who is at least 56 years old accompany you for your appointments.

## 2022-03-29 NOTE — Progress Notes (Signed)
B12 injection  given per orders. Patient tolerated it well without problems. Vitals stable and discharged home from clinic ambulatory. Follow up as scheduled.  

## 2022-03-30 ENCOUNTER — Other Ambulatory Visit: Payer: Self-pay

## 2022-03-30 DIAGNOSIS — K7460 Unspecified cirrhosis of liver: Secondary | ICD-10-CM

## 2022-03-30 DIAGNOSIS — R9389 Abnormal findings on diagnostic imaging of other specified body structures: Secondary | ICD-10-CM

## 2022-03-30 DIAGNOSIS — R7989 Other specified abnormal findings of blood chemistry: Secondary | ICD-10-CM

## 2022-03-30 NOTE — Telephone Encounter (Signed)
Phoned the pt and advised of his labs and recommendations. Pt has appt with Nephrology on Dec 7th. Labs sent to [facility].

## 2022-04-05 ENCOUNTER — Inpatient Hospital Stay: Payer: Medicaid Other | Attending: Physician Assistant

## 2022-04-05 VITALS — BP 167/96 | HR 88 | Temp 98.9°F | Resp 18

## 2022-04-05 DIAGNOSIS — E538 Deficiency of other specified B group vitamins: Secondary | ICD-10-CM | POA: Diagnosis present

## 2022-04-05 MED ORDER — CYANOCOBALAMIN 1000 MCG/ML IJ SOLN
1000.0000 ug | Freq: Once | INTRAMUSCULAR | Status: AC
  Administered 2022-04-05: 1000 ug via INTRAMUSCULAR
  Filled 2022-04-05: qty 1

## 2022-04-05 NOTE — Progress Notes (Signed)
Patient tolerated Vitamin B 12 injection with no complaints voiced.  Site clean and dry with no bruising or swelling noted.  No complaints of pain.  Discharged with vital signs stable and no signs or symptoms of distress noted.   ?

## 2022-04-05 NOTE — Patient Instructions (Signed)
[facility] [place]  Discharge Instructions: Thank you for choosing [facility] to provide your oncology and hematology care.  If you have a lab appointment with the Cancer Center, please come in thru the Main Entrance and check in at the main information desk.  Wear comfortable clothing and clothing appropriate for easy access to any Portacath or PICC line.   We strive to give you quality time with your provider. You may need to reschedule your appointment if you arrive late (15 or more minutes).  Arriving late affects you and other patients whose appointments are after yours.  Also, if you miss three or more appointments without notifying the office, you may be dismissed from the clinic at the provider's discretion.      For prescription refill requests, have your pharmacy contact our office and allow 72 hours for refills to be completed.     To help prevent nausea and vomiting after your treatment, we encourage you to take your nausea medication as directed.  BELOW ARE SYMPTOMS THAT SHOULD BE REPORTED IMMEDIATELY: *FEVER GREATER THAN 100.4 F (38 C) OR HIGHER *CHILLS OR SWEATING *NAUSEA AND VOMITING THAT IS NOT CONTROLLED WITH YOUR NAUSEA MEDICATION *UNUSUAL SHORTNESS OF BREATH *UNUSUAL BRUISING OR BLEEDING *URINARY PROBLEMS (pain or burning when urinating, or frequent urination) *BOWEL PROBLEMS (unusual diarrhea, constipation, pain near the anus) TENDERNESS IN MOUTH AND THROAT WITH OR WITHOUT PRESENCE OF ULCERS (sore throat, sores in mouth, or a toothache) UNUSUAL RASH, SWELLING OR PAIN  UNUSUAL VAGINAL DISCHARGE OR ITCHING   Items with * indicate a potential emergency and should be followed up as soon as possible or go to the Emergency Department if any problems should occur.  Please show the CHEMOTHERAPY ALERT CARD or IMMUNOTHERAPY ALERT CARD at check-in to the Emergency Department and triage nurse.  Should you have questions after your visit or need to  cancel or reschedule your appointment, please contact [facility] [phone]  and follow the prompts.  Office hours are 8:00 a.m. to 4:30 p.m. Monday - Friday. Please note that voicemails left after 4:00 p.m. may not be returned until the following business day.  We are closed weekends and major holidays. You have access to a nurse at all times for urgent questions. Please call the main number to the clinic [phone] and follow the prompts.  For any non-urgent questions, you may also contact your provider using MyChart. We now offer e-Visits for anyone 18 and older to request care online for non-urgent symptoms. For details visit [email or web address].   Also download the MyChart app! Go to the app store, search "MyChart", open the app, select [place], and log in with your MyChart username and password.  Masks are optional in the cancer centers. If you would like for your care team to wear a mask while they are taking care of you, please let them know. You may have one support person who is at least 56 years old accompany you for your appointments.  

## 2022-04-10 NOTE — Patient Instructions (Signed)
[name] [name]  04/10/2022     '@PREFPERIOPPHARMACY'$ @   Your procedure is scheduled on 04/13/2022.   Report to [facility] at  [facility].     Call this number if you have problems the morning of surgery:  [phone]  If you experience any cold or flu symptoms such as cough, fever, chills, shortness of breath, etc. between now and your scheduled surgery, please notify us at the above number.   Remember:  Follow the diet and prep instructions given to you by the office.     Take these medicines the morning of surgery with A SIP OF WATER                                      metoprolol, protonix.     Do not wear jewelry, make-up or nail polish.  Do not wear lotions, powders, or perfumes, or deodorant.  Do not shave 48 hours prior to surgery.  Men may shave face and neck.  Do not bring valuables to the hospital.  [facility] is not responsible for any belongings or valuables.  Contacts, dentures or bridgework may not be worn into surgery.  Leave your suitcase in the car.  After surgery it may be brought to your room.  For patients admitted to the hospital, discharge time will be determined by your treatment team.  Patients discharged the day of surgery will not be allowed to drive home and must have someone with them for 24 hours.    Special instructions:   DO  NOT smoke tobacco or vape for 24 hours before your procedure.  Please read over the following fact sheets that you were given. Anesthesia Post-op Instructions and Care and Recovery After Surgery      Colonoscopy, Adult, Care After The following information offers guidance on how to care for yourself after your procedure. Your health care provider may also give you more specific instructions. If you have problems or questions, contact your health care provider. What can I expect after the procedure? After the procedure, it is common to have: A small amount of blood in your stool for 24 hours after the  procedure. Some gas. Mild cramping or bloating of your abdomen. Follow these instructions at home: Eating and drinking  Drink enough fluid to keep your urine pale yellow. Follow instructions from your health care provider about eating or drinking restrictions. Resume your normal diet as told by your health care provider. Avoid heavy or fried foods that are hard to digest. Activity Rest as told by your health care provider. Avoid sitting for a long time without moving. Get up to take short walks every 1-2 hours. This is important to improve blood flow and breathing. Ask for help if you feel weak or unsteady. Return to your normal activities as told by your health care provider. Ask your health care provider what activities are safe for you. Managing cramping and bloating  Try walking around when you have cramps or feel bloated. If directed, apply heat to your abdomen as told by your health care provider. Use the heat source that your health care provider recommends, such as a moist heat pack or a heating pad. Place a towel between your skin and the heat source. Leave the heat on for 20-30 minutes. Remove the heat if your skin turns bright red. This is especially important if  you are unable to feel pain, heat, or cold. You have a greater risk of getting burned. General instructions If you were given a sedative during the procedure, it can affect you for several hours. Do not drive or operate machinery until your health care provider says that it is safe. For the first 24 hours after the procedure: Do not sign important documents. Do not drink alcohol. Do your regular daily activities at a slower pace than normal. Eat soft foods that are easy to digest. Take over-the-counter and prescription medicines only as told by your health care provider. Keep all follow-up visits. This is important. Contact a health care provider if: You have blood in your stool 2-3 days after the procedure. Get  help right away if: You have more than a small spotting of blood in your stool. You have large blood clots in your stool. You have swelling of your abdomen. You have nausea or vomiting. You have a fever. You have increasing pain in your abdomen that is not relieved with medicine. These symptoms may be an emergency. Get help right away. Call 911. Do not wait to see if the symptoms will go away. Do not drive yourself to the hospital. Summary After the procedure, it is common to have a small amount of blood in your stool. You may also have mild cramping and bloating of your abdomen. If you were given a sedative during the procedure, it can affect you for several hours. Do not drive or operate machinery until your health care provider says that it is safe. Get help right away if you have a lot of blood in your stool, nausea or vomiting, a fever, or increased pain in your abdomen. This information is not intended to replace advice given to you by your health care provider. Make sure you discuss any questions you have with your health care provider. Document Revised: 12/08/2020 Document Reviewed: 12/08/2020 Elsevier Patient Education  [place] After The following information offers guidance on how to care for yourself after your procedure. Your health care provider may also give you more specific instructions. If you have problems or questions, contact your health care provider. What can I expect after the procedure? After the procedure, it is common to have: Tiredness. Little or no memory about what happened during or after the procedure. Impaired judgment when it comes to making decisions. Nausea or vomiting. Some trouble with balance. Follow these instructions at home: For the time period you were told by your health care provider:  Rest. Do not participate in activities where you could fall or become injured. Do not drive or use machinery. Do  not drink alcohol. Do not take sleeping pills or medicines that cause drowsiness. Do not make important decisions or sign legal documents. Do not take care of children on your own. Medicines Take over-the-counter and prescription medicines only as told by your health care provider. If you were prescribed antibiotics, take them as told by your health care provider. Do not stop using the antibiotic even if you start to feel better. Eating and drinking Follow instructions from your health care provider about what you may eat and drink. Drink enough fluid to keep your urine pale yellow. If you vomit: Drink clear fluids slowly and in small amounts as you are able. Clear fluids include water, ice chips, low-calorie sports drinks, and fruit juice that has water added to it (diluted fruit juice). Eat light and bland foods in small amounts  as you are able. These foods include bananas, applesauce, rice, lean meats, toast, and crackers. General instructions  Have a responsible adult stay with you for the time you are told. It is important to have someone help care for you until you are awake and alert. If you have sleep apnea, surgery and some medicines can increase your risk for breathing problems. Follow instructions from your health care provider about wearing your sleep device: When you are sleeping. This includes during daytime naps. While taking prescription pain medicines, sleeping medicines, or medicines that make you drowsy. Do not use any products that contain nicotine or tobacco. These products include cigarettes, chewing tobacco, and vaping devices, such as e-cigarettes. If you need help quitting, ask your health care provider. Contact a health care provider if: You feel nauseous or vomit every time you eat or drink. You feel light-headed. You are still sleepy or having trouble with balance after 24 hours. You get a rash. You have a fever. You have redness or swelling around the IV  site. Get help right away if: You have trouble breathing. You have new confusion after you get home. These symptoms may be an emergency. Get help right away. Call 911. Do not wait to see if the symptoms will go away. Do not drive yourself to the hospital. This information is not intended to replace advice given to you by your health care provider. Make sure you discuss any questions you have with your health care provider. Document Revised: 09/12/2021 Document Reviewed: 09/12/2021 Elsevier Patient Education  [place].

## 2022-04-11 ENCOUNTER — Encounter (HOSPITAL_COMMUNITY)
Admission: RE | Admit: 2022-04-11 | Discharge: 2022-04-11 | Disposition: A | Discharge status: Home / Self Care | Payer: Medicaid Other | Source: Ambulatory Visit | Attending: Internal Medicine | Admitting: Internal Medicine

## 2022-04-11 ENCOUNTER — Encounter (HOSPITAL_COMMUNITY): Payer: Self-pay

## 2022-04-11 VITALS — BP 145/79 | HR 65 | Temp 98.6°F | Resp 18 | Ht 72.0 in | Wt 158.1 lb

## 2022-04-11 DIAGNOSIS — I10 Essential (primary) hypertension: Secondary | ICD-10-CM | POA: Insufficient documentation

## 2022-04-11 DIAGNOSIS — Z01810 Encounter for preprocedural cardiovascular examination: Secondary | ICD-10-CM | POA: Insufficient documentation

## 2022-04-13 ENCOUNTER — Encounter (HOSPITAL_COMMUNITY): Payer: Self-pay | Admitting: Internal Medicine

## 2022-04-13 ENCOUNTER — Other Ambulatory Visit (HOSPITAL_COMMUNITY): Payer: Self-pay

## 2022-04-13 ENCOUNTER — Ambulatory Visit (HOSPITAL_COMMUNITY): Payer: Medicaid Other | Admitting: Anesthesiology

## 2022-04-13 ENCOUNTER — Ambulatory Visit (HOSPITAL_BASED_OUTPATIENT_CLINIC_OR_DEPARTMENT_OTHER): Payer: Medicaid Other | Admitting: Anesthesiology

## 2022-04-13 ENCOUNTER — Encounter (HOSPITAL_COMMUNITY)
Admission: RE | Disposition: A | Discharge status: Home / Self Care | Payer: Self-pay | Source: Ambulatory Visit | Attending: Internal Medicine

## 2022-04-13 ENCOUNTER — Ambulatory Visit (HOSPITAL_COMMUNITY)
Admission: RE | Admit: 2022-04-13 | Discharge: 2022-04-13 | Disposition: A | Discharge status: Home / Self Care | Payer: Medicaid Other | Source: Ambulatory Visit | Attending: Internal Medicine | Admitting: Internal Medicine

## 2022-04-13 DIAGNOSIS — K5730 Diverticulosis of large intestine without perforation or abscess without bleeding: Secondary | ICD-10-CM | POA: Insufficient documentation

## 2022-04-13 DIAGNOSIS — I10 Essential (primary) hypertension: Secondary | ICD-10-CM | POA: Insufficient documentation

## 2022-04-13 DIAGNOSIS — D124 Benign neoplasm of descending colon: Secondary | ICD-10-CM | POA: Insufficient documentation

## 2022-04-13 DIAGNOSIS — Z86010 Personal history of colonic polyps: Secondary | ICD-10-CM | POA: Diagnosis not present

## 2022-04-13 DIAGNOSIS — Z1211 Encounter for screening for malignant neoplasm of colon: Secondary | ICD-10-CM | POA: Diagnosis present

## 2022-04-13 DIAGNOSIS — Z09 Encounter for follow-up examination after completed treatment for conditions other than malignant neoplasm: Secondary | ICD-10-CM | POA: Diagnosis not present

## 2022-04-13 DIAGNOSIS — J449 Chronic obstructive pulmonary disease, unspecified: Secondary | ICD-10-CM | POA: Diagnosis not present

## 2022-04-13 DIAGNOSIS — D123 Benign neoplasm of transverse colon: Secondary | ICD-10-CM | POA: Insufficient documentation

## 2022-04-13 DIAGNOSIS — D122 Benign neoplasm of ascending colon: Secondary | ICD-10-CM | POA: Diagnosis not present

## 2022-04-13 DIAGNOSIS — K7030 Alcoholic cirrhosis of liver without ascites: Secondary | ICD-10-CM | POA: Insufficient documentation

## 2022-04-13 DIAGNOSIS — Z87891 Personal history of nicotine dependence: Secondary | ICD-10-CM | POA: Insufficient documentation

## 2022-04-13 HISTORY — PX: POLYPECTOMY: SHX5525

## 2022-04-13 HISTORY — PX: COLONOSCOPY WITH PROPOFOL: SHX5780

## 2022-04-13 SURGERY — COLONOSCOPY WITH PROPOFOL
Anesthesia: General

## 2022-04-13 MED ORDER — STERILE WATER FOR IRRIGATION IR SOLN
Status: DC | PRN
  Administered 2022-04-13: 100 mL

## 2022-04-13 MED ORDER — LACTATED RINGERS IV SOLN: INTRAVENOUS | Status: DC

## 2022-04-13 MED ORDER — GLUCAGON HCL RDNA (DIAGNOSTIC) 1 MG IJ SOLR
INTRAMUSCULAR | Status: DC | PRN
  Administered 2022-04-13: .5 mg via INTRAVENOUS

## 2022-04-13 MED ORDER — PROPOFOL 500 MG/50ML IV EMUL
INTRAVENOUS | Status: DC | PRN
  Administered 2022-04-13: 200 ug/kg/min via INTRAVENOUS

## 2022-04-13 MED ORDER — GLUCAGON HCL RDNA (DIAGNOSTIC) 1 MG IJ SOLR
INTRAMUSCULAR | Status: AC
  Filled 2022-04-13: qty 1

## 2022-04-13 MED ORDER — PROPOFOL 10 MG/ML IV BOLUS
INTRAVENOUS | Status: DC | PRN
  Administered 2022-04-13 (×2): 100 mg via INTRAVENOUS

## 2022-04-13 MED ORDER — LIDOCAINE HCL 1 % IJ SOLN
INTRAMUSCULAR | Status: DC | PRN
  Administered 2022-04-13: 50 mg via INTRADERMAL

## 2022-04-13 NOTE — H&P (Signed)
$'@LOGO'u$ @   Primary Care Physician:  Carrolyn Meiers, MD Primary Gastroenterologist:  Dr.   Pre-Procedure History & Physical: HPI:  [name] [name] is a 56 y.o. male here for a surveillance colonoscopy.  Multiple adenomas removed his colon 2019.  History of EtOH related cirrhosis.  Bowel symptoms at this time.  Past Medical History:  Diagnosis Date   Asthma    B12 deficiency 03/15/2022   Cirrhosis ([place])    Dyspnea    High cholesterol    History of kidney stones    Hypertension    Kidney stones     Past Surgical History:  Procedure Laterality Date   APPENDECTOMY     BIOPSY  07/22/2020   Procedure: BIOPSY;  Surgeon: Daneil Dolin, MD;  [facility]: [facility];  Service: Endoscopy;;   COLONOSCOPY WITH PROPOFOL N/A 04/18/2017   non-bleeding internal hemorrhoids, two 4-6 mm polyps in descending colon and cecum, pancolonic diverticulosis, single cecal AVM. Tubular adenomas, surveillance in 2023.    ESOPHAGOGASTRODUODENOSCOPY (EGD) WITH PROPOFOL N/A 07/22/2020   normal esophagus, small hiatal hernia, abnormal gastric mucosa, abnormal appearing ampula and periampullary mucosa. Mild chronic gastritis.   ESOPHAGOGASTRODUODENOSCOPY (EGD) WITH PROPOFOL N/A 09/08/2021   normal esophagus, retained food in stomach and duodenum precluded complete examination.   FRACTURE SURGERY     left arm   IR PARACENTESIS  05/31/2021   LOWER EXTREMITY VENOGRAPHY N/A 08/10/2020   Procedure: LOWER EXTREMITY VENOGRAPHY;  Surgeon: Waynetta Sandy, MD;  Location: [facility];  Service: Cardiovascular;  Laterality: N/A;   PERIPHERAL VASCULAR BALLOON ANGIOPLASTY Left 08/10/2020   Procedure: PERIPHERAL VASCULAR BALLOON ANGIOPLASTY;  Surgeon: Waynetta Sandy, MD;  Location: [facility] [facility];  Service: Cardiovascular;  Laterality: Left;  lower extremity venous   PERIPHERAL VASCULAR THROMBECTOMY N/A 08/10/2020   Procedure: PERIPHERAL VASCULAR THROMBECTOMY;  Surgeon: Waynetta Sandy, MD;  Location: [place] [facility];  Service: Cardiovascular;  Laterality: N/A;   POLYPECTOMY  04/18/2017   Procedure: POLYPECTOMY;  Surgeon: Daneil Dolin, MD;  [facility];  Service: Endoscopy;;    Prior to Admission medications   Medication Sig Start Date End Date Taking? Authorizing Provider  acetaminophen (TYLENOL) 500 MG tablet Take 1,000 mg by mouth every 6 (six) hours as needed for moderate pain.   Yes [provider]  albuterol (PROVENTIL HFA) 108 (90 Base) MCG/ACT inhaler INHALE 2 PUFFS BY MOUTH EVERY 6 HOURS AS NEEDED FOR COUGHING, WHEEZING, OR SHORTNESS OF BREATH 02/10/21  Yes Emokpae, Courage, MD  aspirin 81 MG chewable tablet Chew 81 mg by mouth daily.   Yes [provider]  Cyanocobalamin (B-12 COMPLIANCE INJECTION IJ) Inject 1 Dose as directed every 30 (thirty) days.   Yes [provider]  furosemide (LASIX) 20 MG tablet Take 2 tablets in morning and 1 tablet in the afternoon. (40 milligrams in morning and 20 milligrams in afternoon). 07/13/21  Yes Annitta Needs, NP  Menthol-Methyl Salicylate (MUSCLE RUB) 10-15 % CREA Apply 1 Application topically as needed for muscle pain.   Yes [provider]  metoprolol tartrate (LOPRESSOR) 25 MG tablet Take 25 mg by mouth 2 (two) times daily. 05/24/21  Yes [provider]  mometasone-formoterol (DULERA) 200-5 MCG/ACT AERO Inhale 2 puffs into the lungs 2 (two) times daily. 02/10/21  Yes Emokpae, Courage, MD  pantoprazole (PROTONIX) 40 MG tablet Take 1 tablet (40 mg total) by mouth daily. 09/01/21 09/01/22 Yes Annitta Needs, NP  sodium bicarbonate 650 MG  tablet Take 650 mg by mouth 2 (two) times daily.   Yes [provider]  spironolactone (ALDACTONE) 50 MG tablet Take 50-100 mg by mouth See admin instructions. Take 100 mg by mouth in morning and 50 mg in the afternoon   Yes [provider]  folic acid (FOLVITE) 614 MCG tablet Take 1 tablet (400 mcg total) by mouth  daily. Patient not taking: Reported on 04/05/2022 03/15/22   [name], PA-C    Allergies as of 03/09/2022   (No Known Allergies)    Family History  Problem Relation Age of Onset   Cancer Father        throat   Diabetes Sister    Colon cancer Neg Hx     Social History   Socioeconomic History   Marital status: Single    Spouse name: Not on file   Number of children: Not on file   Years of education: Not on file   Highest education level: Not on file  Occupational History   Not on file  Tobacco Use   Smoking status: Every Day    Packs/day: 0.50    Years: 33.00    Total pack years: 16.50    Types: Cigarettes   Smokeless tobacco: Never  Vaping Use   Vaping Use: Never used  Substance and Sexual Activity   Alcohol use: Not Currently    Comment: last intake today   Drug use: No   Sexual activity: Yes    Birth control/protection: None  Other Topics Concern   Not on file  Social History Narrative   Not on file   Social Determinants of Health   Financial Resource Strain: High Risk (09/13/2020)   Overall Financial Resource Strain (CARDIA)    Difficulty of Paying Living Expenses: Very hard  Food Insecurity: No Food Insecurity (09/13/2020)   Hunger Vital Sign    Worried About Running Out of Food in the Last Year: Never true    Bear Dance in the Last Year: Never true  Transportation Needs: No Transportation Needs (09/13/2020)   PRAPARE - Hydrologist (Medical): No    Lack of Transportation (Non-Medical): No  Physical Activity: Sufficiently Active (09/13/2020)   Exercise Vital Sign    Days of Exercise per Week: 7 days    Minutes of Exercise per Session: 30 min  Stress: No Stress Concern Present (09/13/2020)   [place]    Feeling of Stress : Only a little  Social Connections: Moderately Isolated (09/13/2020)   Social Connection and Isolation Panel [NHANES]     Frequency of Communication with Friends and Family: More than three times a week    Frequency of Social Gatherings with Friends and Family: Once a week    Attends Religious Services: More than 4 times per year    Active Member of [employer] or Organizations: No    Attends Archivist Meetings: Never    Marital Status: Never married  Intimate Partner Violence: Not At Risk (09/13/2020)   Humiliation, Afraid, Rape, and Kick questionnaire    Fear of Current or Ex-Partner: No    Emotionally Abused: No    Physically Abused: No    Sexually Abused: No    Review of Systems: See HPI, otherwise negative ROS  Physical Exam: BP (!) 154/99 (BP Location: Left Arm)   Pulse 63   Temp 98.5 F (36.9 C)   Resp 11   SpO2  99%  General:   Alert,  Well-developed, well-nourished, pleasant and cooperative in NAD Neck:  Supple; no masses or thyromegaly. No significant cervical adenopathy. Lungs:  Clear throughout to auscultation.   No wheezes, crackles, or rhonchi. No acute distress. Heart:  Regular rate and rhythm; no murmurs, clicks, rubs,  or gallops. Abdomen: Non-distended, normal bowel sounds.  Soft and nontender without appreciable mass or hepatosplenomegaly.  Pulses:  Normal pulses noted. Extremities:  Without clubbing or edema.  Impression/Plan:   56 year old gentleman with a EtOH related cirrhosis here for surveillance colonoscopy.  History of multiple colonic adenomas removed 5 years ago.  I have offered the patient a surveillance colonoscopy today. The risks, benefits, limitations, alternatives and imponderables have been reviewed with the patient. Questions have been answered. All parties are agreeable.       Notice: This dictation was prepared with Dragon dictation along with smaller phrase technology. Any transcriptional errors that result from this process are unintentional and may not be corrected upon review.

## 2022-04-13 NOTE — Transfer of Care (Signed)
Immediate Anesthesia Transfer of Care Note  Patient: [name] [name]  Procedure(s) Performed: COLONOSCOPY WITH PROPOFOL POLYPECTOMY  Patient Location: Short Stay  Anesthesia Type:General  Level of Consciousness: awake  Airway & Oxygen Therapy: Patient Spontanous Breathing  Post-op Assessment: Report given to RN  Post vital signs: Reviewed  Last Vitals:  Vitals Value Taken Time  BP    Temp    Pulse    Resp    SpO2      Last Pain:  Vitals:   04/13/22 0948  PainSc: 0-No pain         Complications: No notable events documented.

## 2022-04-13 NOTE — Anesthesia Postprocedure Evaluation (Signed)
Anesthesia Post Note  Patient: [name] [name]  Procedure(s) Performed: COLONOSCOPY WITH PROPOFOL POLYPECTOMY  Patient location during evaluation: Short Stay Anesthesia Type: General Level of consciousness: awake and alert Pain management: pain level controlled Vital Signs Assessment: post-procedure vital signs reviewed and stable Respiratory status: spontaneous breathing Cardiovascular status: blood pressure returned to baseline and stable Postop Assessment: no apparent nausea or vomiting Anesthetic complications: no   No notable events documented.   Last Vitals:  Vitals:   04/13/22 0800 04/13/22 1036  BP: (!) 154/99 135/88  Pulse: 63 80  Resp: 11 18  Temp: 36.9 C 36.4 C  SpO2: 99% 100%    Last Pain:  Vitals:   04/13/22 1036  TempSrc: Oral  PainSc: 0-No pain                 Delane Wessinger

## 2022-04-13 NOTE — Discharge Instructions (Signed)
  Colonoscopy Discharge Instructions  Read the instructions outlined below and refer to this sheet in the next few weeks. These discharge instructions provide you with general information on caring for yourself after you leave the hospital. Your doctor may also give you specific instructions. While your treatment has been planned according to the most current medical practices available, unavoidable complications occasionally occur. If you have any problems or questions after discharge, call Dr. Gala Romney at [phone]. ACTIVITY You may resume your regular activity, but move at a slower pace for the next 24 hours.  Take frequent rest periods for the next 24 hours.  Walking will help get rid of the air and reduce the bloated feeling in your belly (abdomen).  No driving for 24 hours (because of the medicine (anesthesia) used during the test).   Do not sign any important legal documents or operate any machinery for 24 hours (because of the anesthesia used during the test).  NUTRITION Drink plenty of fluids.  You may resume your normal diet as instructed by your doctor.  Begin with a light meal and progress to your normal diet. Heavy or fried foods are harder to digest and may make you feel sick to your stomach (nauseated).  Avoid alcoholic beverages for 24 hours or as instructed.  MEDICATIONS You may resume your normal medications unless your doctor tells you otherwise.  WHAT YOU CAN EXPECT TODAY Some feelings of bloating in the abdomen.  Passage of more gas than usual.  Spotting of blood in your stool or on the toilet paper.  IF YOU HAD POLYPS REMOVED DURING THE COLONOSCOPY: No aspirin products for 7 days or as instructed.  No alcohol for 7 days or as instructed.  Eat a soft diet for the next 24 hours.  FINDING OUT THE RESULTS OF YOUR TEST Not all test results are available during your visit. If your test results are not back during the visit, make an appointment with your caregiver to find out the  results. Do not assume everything is normal if you have not heard from your caregiver or the medical facility. It is important for you to follow up on all of your test results.  SEEK IMMEDIATE MEDICAL ATTENTION IF: You have more than a spotting of blood in your stool.  Your belly is swollen (abdominal distention).  You are nauseated or vomiting.  You have a temperature over 101.  You have abdominal pain or discomfort that is severe or gets worse throughout the day.        Multiple polyps removed from your colon   colon polyp and diverticulosis information provided   further recommendations to follow pending review of pathology report   at patient request,   I called Orson Aloe at [phone] -

## 2022-04-13 NOTE — Anesthesia Preprocedure Evaluation (Signed)
Anesthesia Evaluation  Patient identified by MRN, date of birth, ID band Patient awake    Reviewed: Allergy & Precautions, H&P , NPO status , Patient's Chart, lab work & pertinent test results, reviewed documented beta blocker date and time   Airway Mallampati: II  TM Distance: >3 FB Neck ROM: full    Dental no notable dental hx. (+) Missing   Pulmonary asthma , COPD, Current Smoker and Patient abstained from smoking.   Pulmonary exam normal breath sounds clear to auscultation       Cardiovascular Exercise Tolerance: Good hypertension,  Rhythm:regular Rate:Normal     Neuro/Psych negative neurological ROS  negative psych ROS   GI/Hepatic negative GI ROS,,,(+) Cirrhosis   Esophageal Varices      Endo/Other  negative endocrine ROS    Renal/GU negative Renal ROS  negative genitourinary   Musculoskeletal   Abdominal   Peds  Hematology  (+) Blood dyscrasia, anemia   Anesthesia Other Findings   Reproductive/Obstetrics negative OB ROS                             Anesthesia Physical Anesthesia Plan  ASA: 3  Anesthesia Plan: General   Post-op Pain Management: Minimal or no pain anticipated   Induction:   PONV Risk Score and Plan: Propofol infusion and TIVA  Airway Management Planned:   Additional Equipment:   Intra-op Plan:   Post-operative Plan:   Informed Consent: I have reviewed the patients History and Physical, chart, labs and discussed the procedure including the risks, benefits and alternatives for the proposed anesthesia with the patient or authorized representative who has indicated his/her understanding and acceptance.       Plan Discussed with: CRNA  Anesthesia Plan Comments:         Anesthesia Quick Evaluation

## 2022-04-13 NOTE — Op Note (Signed)
[facility] Patient Name: [name] [name] Procedure Date: 04/13/2022 9:25 AM MRN: [id number] Date of Birth: February 17, 1966 Attending MD: Norvel Richards , MD, [id number] CSN: [id number] Age: 56 Admit Type: Outpatient Procedure:                Colonoscopy Indications:              High risk colon cancer surveillance: Personal                            history of colonic polyps Providers:                Norvel Richards, MD, Caprice Kluver, Ladoris Gene                            Technician, Technician Referring MD:              Medicines:                Propofol per Anesthesia Complications:            No immediate complications. Estimated Blood Loss:     Estimated blood loss was minimal. Procedure:                Pre-Anesthesia Assessment:                           - Prior to the procedure, a History and Physical                            was performed, and patient medications and                            allergies were reviewed. The patient's tolerance of                            previous anesthesia was also reviewed. The risks                            and benefits of the procedure and the sedation                            options and risks were discussed with the patient.                            All questions were answered, and informed consent                            was obtained. Prior Anticoagulants: The patient has                            taken no anticoagulant or antiplatelet agents. ASA                            Grade Assessment: III - A patient with severe  systemic disease. After reviewing the risks and                            benefits, the patient was deemed in satisfactory                            condition to undergo the procedure.                           After obtaining informed consent, the colonoscope                            was passed under direct vision. Throughout the                            procedure, the  patient's blood pressure, pulse, and                            oxygen saturations were monitored continuously. The                            [id number]) scope was introduced through the                            anus and advanced to the the cecum, identified by                            appendiceal orifice and ileocecal valve. The                            colonoscopy was performed without difficulty. The                            patient tolerated the procedure well. The quality                            of the bowel preparation was adequate. The                            ileocecal valve, appendiceal orifice, and rectum                            were photographed. Scope In: 9:53:49 AM Scope Out: 10:31:11 AM Scope Withdrawal Time: 0 hours 32 minutes 26 seconds  Total Procedure Duration: 0 hours 37 minutes 22 seconds  Findings:      The perianal and digital rectal examinations were normal.      Multiple medium-mouthed diverticula were found in the entire colon.      Eight semi-pedunculated polyps were found in the splenic flexure,       ascending colon and proximal ascending colon. The polyps were 5 to 8 mm       in size. These polyps were removed with a cold snare. Resection and       retrieval were complete. Estimated blood loss was minimal.      An 11  mm polyp was found in the descending colon. The polyp was       pedunculated. The polyp was removed with a hot snare. Resection and       retrieval were complete. Estimated blood loss: none.      The exam was otherwise without abnormality on direct and retroflexion       views. Impression:               - Diverticulosis in the entire examined colon.                           - Eight 5 to 8 mm polyps at the splenic flexure, in                            the ascending colon and in the proximal ascending                            colon, removed with a cold snare. Resected and                            retrieved.                            - One 11 mm polyp in the descending colon, removed                            with a hot snare. Resected and retrieved.                           - The examination was otherwise normal on direct                            and retroflexion views. Moderate Sedation:      Moderate (conscious) sedation was personally administered by an       anesthesia professional. The following parameters were monitored: oxygen       saturation, heart rate, blood pressure, respiratory rate, EKG, adequacy       of pulmonary ventilation, and response to care. Recommendation:           - Patient has a contact number available for                            emergencies. The signs and symptoms of potential                            delayed complications were discussed with the                            patient. Return to normal activities tomorrow.                            Written discharge instructions were provided to the                            patient.                           -  Resume previous diet.                           - Continue present medications.                           - Repeat colonoscopy date to be determined after                            pending pathology results are reviewed for                            surveillance.                           - Return to GI office in 3 months. Procedure Code(s):        --- Professional ---                           [phone], Colonoscopy, flexible; with removal of                            tumor(s), polyp(s), or other lesion(s) by snare                            technique Diagnosis Code(s):        --- Professional ---                           Z86.010, Personal history of colonic polyps                           D12.3, Benign neoplasm of transverse colon (hepatic                            flexure or splenic flexure)                           D12.2, Benign neoplasm of ascending colon                           D12.4, Benign  neoplasm of descending colon                           K57.30, Diverticulosis of large intestine without                            perforation or abscess without bleeding CPT copyright 2022 American [employer] Association. All rights reserved. The codes documented in this report are preliminary and upon coder review may  be revised to meet current compliance requirements. [name]. Allye Hoyos, MD Norvel Richards, MD 04/13/2022 10:47:09 AM This report has been signed electronically. Number of Addenda: 0

## 2022-04-14 ENCOUNTER — Encounter: Payer: Self-pay | Admitting: Internal Medicine

## 2022-04-14 LAB — SURGICAL PATHOLOGY

## 2022-04-20 ENCOUNTER — Encounter (HOSPITAL_COMMUNITY): Payer: Self-pay | Admitting: Internal Medicine

## 2022-04-27 ENCOUNTER — Ambulatory Visit: Payer: Medicaid Other | Admitting: Urology

## 2022-05-05 ENCOUNTER — Inpatient Hospital Stay: Payer: Medicaid Other | Attending: Physician Assistant

## 2022-05-05 VITALS — BP 125/78 | HR 68 | Temp 97.5°F | Resp 18

## 2022-05-05 DIAGNOSIS — E538 Deficiency of other specified B group vitamins: Secondary | ICD-10-CM

## 2022-05-05 MED ORDER — CYANOCOBALAMIN 1000 MCG/ML IJ SOLN
1000.0000 ug | Freq: Once | INTRAMUSCULAR | Status: AC
  Administered 2022-05-05: 1000 ug via INTRAMUSCULAR
  Filled 2022-05-05: qty 1

## 2022-05-05 NOTE — Progress Notes (Signed)
[name] [name] presents today for injection per the provider's orders.  B12 injection administration without incident; injection site WNL; see MAR for injection details.  Patient tolerated procedure well and without incident.  No questions or complaints noted at this time. Vital signs stable. No complaints at this time. Discharged from clinic ambulatory in stable condition. Alert and oriented x 3. F/U with [facility] as scheduled.

## 2022-05-05 NOTE — Patient Instructions (Signed)
[facility]  Discharge Instructions: Thank you for choosing [facility] to provide your oncology and hematology care.  If you have a lab appointment with the [facility], please come in thru the Main Entrance and check in at the main information desk.  Wear comfortable clothing and clothing appropriate for easy access to any Portacath or PICC line.   We strive to give you quality time with your provider. You may need to reschedule your appointment if you arrive late (15 or more minutes).  Arriving late affects you and other patients whose appointments are after yours.  Also, if you miss three or more appointments without notifying the office, you may be dismissed from the clinic at the provider's discretion.      For prescription refill requests, have your pharmacy contact our office and allow 72 hours for refills to be completed.    Today you received the following chemotherapy and/or immunotherapy agents Vitamin B12 injection.  Vitamin B12 Injection What is this medication? Vitamin B12 (VAHY tuh min B12) prevents and treats low vitamin B12 levels in your body. It is used in people who do not get enough vitamin B12 from their diet or when their digestive tract does not absorb enough. Vitamin B12 plays an important role in maintaining the health of your nervous system and red blood cells. This medicine may be used for other purposes; ask your health care provider or pharmacist if you have questions. COMMON BRAND NAME(S): B-12 Compliance Kit, B-12 Injection Kit, Cyomin, Dodex, LA-12, Nutri-Twelve, Physicians EZ Use B-12, Primabalt What should I tell my care team before I take this medication? They need to know if you have any of these conditions: Kidney disease Leber's disease Megaloblastic anemia An unusual or allergic reaction to cyanocobalamin, cobalt, other medications, foods, dyes, or preservatives Pregnant or trying to get pregnant Breast-feeding How  should I use this medication? This medication is injected into a muscle or deeply under the skin. It is usually given in a clinic or care team's office. However, your care team may teach you how to inject yourself. Follow all instructions. Talk to your care team about the use of this medication in children. Special care may be needed. Overdosage: If you think you have taken too much of this medicine contact a poison control center or emergency room at once. NOTE: This medicine is only for you. Do not share this medicine with others. What if I miss a dose? If you are given your dose at a clinic or care team's office, call to reschedule your appointment. If you give your own injections, and you miss a dose, take it as soon as you can. If it is almost time for your next dose, take only that dose. Do not take double or extra doses. What may interact with this medication? Alcohol Colchicine This list may not describe all possible interactions. Give your health care provider a list of all the medicines, herbs, non-prescription drugs, or dietary supplements you use. Also tell them if you smoke, drink alcohol, or use illegal drugs. Some items may interact with your medicine. What should I watch for while using this medication? Visit your care team regularly. You may need blood work done while you are taking this medication. You may need to follow a special diet. Talk to your care team. Limit your alcohol intake and avoid smoking to get the best benefit. What side effects may I notice from receiving this medication? Side effects that you  should report to your care team as soon as possible: Allergic reactions--skin rash, itching, hives, swelling of the face, lips, tongue, or throat Swelling of the ankles, hands, or feet Trouble breathing Side effects that usually do not require medical attention (report to your care team if they continue or are bothersome): Diarrhea This list may not describe all possible  side effects. Call your doctor for medical advice about side effects. You may report side effects to FDA at 1-800-FDA-1088. Where should I keep my medication? Keep out of the reach of children. Store at room temperature between 15 and 30 degrees C (59 and 85 degrees F). Protect from light. Throw away any unused medication after the expiration date. NOTE: This sheet is a summary. It may not cover all possible information. If you have questions about this medicine, talk to your doctor, pharmacist, or health care provider.  2023 Elsevier/Gold Standard (2007-06-08 00:00:00)       To help prevent nausea and vomiting after your treatment, we encourage you to take your nausea medication as directed.  BELOW ARE SYMPTOMS THAT SHOULD BE REPORTED IMMEDIATELY: *FEVER GREATER THAN 100.4 F (38 C) OR HIGHER *CHILLS OR SWEATING *NAUSEA AND VOMITING THAT IS NOT CONTROLLED WITH YOUR NAUSEA MEDICATION *UNUSUAL SHORTNESS OF BREATH *UNUSUAL BRUISING OR BLEEDING *URINARY PROBLEMS (pain or burning when urinating, or frequent urination) *BOWEL PROBLEMS (unusual diarrhea, constipation, pain near the anus) TENDERNESS IN MOUTH AND THROAT WITH OR WITHOUT PRESENCE OF ULCERS (sore throat, sores in mouth, or a toothache) UNUSUAL RASH, SWELLING OR PAIN  UNUSUAL VAGINAL DISCHARGE OR ITCHING   Items with * indicate a potential emergency and should be followed up as soon as possible or go to the Emergency Department if any problems should occur.  Please show the CHEMOTHERAPY ALERT CARD or IMMUNOTHERAPY ALERT CARD at check-in to the Emergency Department and triage nurse.  Should you have questions after your visit or need to cancel or reschedule your appointment, please contact [facility] [phone]  and follow the prompts.  Office hours are 8:00 a.m. to 4:30 p.m. Monday - Friday. Please note that voicemails left after 4:00 p.m. may not be returned until the following business day.  We are closed  weekends and major holidays. You have access to a nurse at all times for urgent questions. Please call the main number to the clinic [phone] and follow the prompts.  For any non-urgent questions, you may also contact your provider using MyChart. We now offer e-Visits for anyone 21 and older to request care online for non-urgent symptoms. For details visit [email or web address].   Also download the MyChart app! Go to the app store, search "MyChart", open the app, select [facility], and log in with your MyChart username and password.

## 2022-05-19 ENCOUNTER — Emergency Department (HOSPITAL_COMMUNITY): Payer: Medicaid Other

## 2022-05-19 ENCOUNTER — Encounter (HOSPITAL_COMMUNITY): Payer: Self-pay

## 2022-05-19 ENCOUNTER — Other Ambulatory Visit: Payer: Self-pay

## 2022-05-19 ENCOUNTER — Inpatient Hospital Stay (HOSPITAL_COMMUNITY)
Admission: EM | Admit: 2022-05-19 | Discharge: 2022-05-24 | DRG: 682 | Disposition: A | Discharge status: Home / Self Care | Payer: Medicaid Other | Attending: Internal Medicine | Admitting: Internal Medicine

## 2022-05-19 DIAGNOSIS — E871 Hypo-osmolality and hyponatremia: Secondary | ICD-10-CM | POA: Diagnosis present

## 2022-05-19 DIAGNOSIS — J45909 Unspecified asthma, uncomplicated: Secondary | ICD-10-CM | POA: Diagnosis present

## 2022-05-19 DIAGNOSIS — E860 Dehydration: Secondary | ICD-10-CM | POA: Diagnosis present

## 2022-05-19 DIAGNOSIS — Z6823 Body mass index (BMI) 23.0-23.9, adult: Secondary | ICD-10-CM

## 2022-05-19 DIAGNOSIS — E8809 Other disorders of plasma-protein metabolism, not elsewhere classified: Secondary | ICD-10-CM | POA: Diagnosis present

## 2022-05-19 DIAGNOSIS — D539 Nutritional anemia, unspecified: Secondary | ICD-10-CM | POA: Diagnosis present

## 2022-05-19 DIAGNOSIS — K219 Gastro-esophageal reflux disease without esophagitis: Secondary | ICD-10-CM | POA: Diagnosis present

## 2022-05-19 DIAGNOSIS — F1011 Alcohol abuse, in remission: Secondary | ICD-10-CM | POA: Diagnosis present

## 2022-05-19 DIAGNOSIS — Z87442 Personal history of urinary calculi: Secondary | ICD-10-CM

## 2022-05-19 DIAGNOSIS — E778 Other disorders of glycoprotein metabolism: Secondary | ICD-10-CM | POA: Diagnosis present

## 2022-05-19 DIAGNOSIS — E7800 Pure hypercholesterolemia, unspecified: Secondary | ICD-10-CM | POA: Diagnosis present

## 2022-05-19 DIAGNOSIS — K7031 Alcoholic cirrhosis of liver with ascites: Secondary | ICD-10-CM | POA: Diagnosis present

## 2022-05-19 DIAGNOSIS — E861 Hypovolemia: Secondary | ICD-10-CM | POA: Diagnosis present

## 2022-05-19 DIAGNOSIS — F17200 Nicotine dependence, unspecified, uncomplicated: Secondary | ICD-10-CM | POA: Insufficient documentation

## 2022-05-19 DIAGNOSIS — K8020 Calculus of gallbladder without cholecystitis without obstruction: Secondary | ICD-10-CM | POA: Diagnosis present

## 2022-05-19 DIAGNOSIS — F17210 Nicotine dependence, cigarettes, uncomplicated: Secondary | ICD-10-CM | POA: Diagnosis present

## 2022-05-19 DIAGNOSIS — N179 Acute kidney failure, unspecified: Principal | ICD-10-CM | POA: Diagnosis present

## 2022-05-19 DIAGNOSIS — N136 Pyonephrosis: Secondary | ICD-10-CM | POA: Diagnosis present

## 2022-05-19 DIAGNOSIS — Z1152 Encounter for screening for COVID-19: Secondary | ICD-10-CM

## 2022-05-19 DIAGNOSIS — Z91199 Patient's noncompliance with other medical treatment and regimen due to unspecified reason: Secondary | ICD-10-CM

## 2022-05-19 DIAGNOSIS — Z86010 Personal history of colonic polyps: Secondary | ICD-10-CM

## 2022-05-19 DIAGNOSIS — I10 Essential (primary) hypertension: Secondary | ICD-10-CM | POA: Diagnosis present

## 2022-05-19 DIAGNOSIS — K859 Acute pancreatitis without necrosis or infection, unspecified: Secondary | ICD-10-CM

## 2022-05-19 DIAGNOSIS — Z833 Family history of diabetes mellitus: Secondary | ICD-10-CM

## 2022-05-19 DIAGNOSIS — K7030 Alcoholic cirrhosis of liver without ascites: Secondary | ICD-10-CM | POA: Diagnosis present

## 2022-05-19 DIAGNOSIS — E440 Moderate protein-calorie malnutrition: Secondary | ICD-10-CM | POA: Diagnosis present

## 2022-05-19 DIAGNOSIS — N390 Urinary tract infection, site not specified: Secondary | ICD-10-CM | POA: Diagnosis present

## 2022-05-19 DIAGNOSIS — K852 Alcohol induced acute pancreatitis without necrosis or infection: Secondary | ICD-10-CM | POA: Diagnosis not present

## 2022-05-19 LAB — CBC
HCT: 40.3 % (ref 39.0–52.0)
Hemoglobin: 13.8 g/dL (ref 13.0–17.0)
MCH: 32.4 pg (ref 26.0–34.0)
MCHC: 34.2 g/dL (ref 30.0–36.0)
MCV: 94.6 fL (ref 80.0–100.0)
Platelets: 336 10*3/uL (ref 150–400)
RBC: 4.26 MIL/uL (ref 4.22–5.81)
RDW: 13.5 % (ref 11.5–15.5)
WBC: 7.3 10*3/uL (ref 4.0–10.5)
nRBC: 0 % (ref 0.0–0.2)

## 2022-05-19 LAB — BASIC METABOLIC PANEL
Anion gap: 13 (ref 5–15)
Anion gap: 11 (ref 5–15)
BUN: 59 mg/dL — ABNORMAL HIGH (ref 6–20)
BUN: 57 mg/dL — ABNORMAL HIGH (ref 6–20)
CO2: 17 mmol/L — ABNORMAL LOW (ref 22–32)
CO2: 17 mmol/L — ABNORMAL LOW (ref 22–32)
Calcium: 8.4 mg/dL — ABNORMAL LOW (ref 8.9–10.3)
Calcium: 7.5 mg/dL — ABNORMAL LOW (ref 8.9–10.3)
Chloride: 87 mmol/L — ABNORMAL LOW (ref 98–111)
Chloride: 93 mmol/L — ABNORMAL LOW (ref 98–111)
Creatinine, Ser: 3.32 mg/dL — ABNORMAL HIGH (ref 0.61–1.24)
Creatinine, Ser: 2.91 mg/dL — ABNORMAL HIGH (ref 0.61–1.24)
GFR, Estimated: 21 mL/min — ABNORMAL LOW (ref >60)
GFR, Estimated: 25 mL/min — ABNORMAL LOW (ref >60)
Glucose, Bld: 98 mg/dL (ref 70–99)
Glucose, Bld: 98 mg/dL (ref 70–99)
Potassium: 5.2 mmol/L — ABNORMAL HIGH (ref 3.5–5.1)
Potassium: 5.4 mmol/L — ABNORMAL HIGH (ref 3.5–5.1)
Sodium: 117 mmol/L — CL (ref 135–145)
Sodium: 121 mmol/L — ABNORMAL LOW (ref 135–145)

## 2022-05-19 LAB — URINALYSIS, ROUTINE W REFLEX MICROSCOPIC
Bilirubin Urine: NEGATIVE
Glucose, UA: NEGATIVE mg/dL
Ketones, ur: NEGATIVE mg/dL
Nitrite: NEGATIVE
Protein, ur: 100 mg/dL — AB
Specific Gravity, Urine: 1.008 (ref 1.005–1.030)
WBC, UA: >50 WBC/hpf — ABNORMAL HIGH (ref 0–5)
pH: 7 (ref 5.0–8.0)

## 2022-05-19 LAB — RESP PANEL BY RT-PCR (RSV, FLU A&B, COVID)  RVPGX2
Influenza A by PCR: NEGATIVE
Influenza B by PCR: NEGATIVE
Resp Syncytial Virus by PCR: NEGATIVE
SARS Coronavirus 2 by RT PCR: NEGATIVE

## 2022-05-19 LAB — HEPATIC FUNCTION PANEL
ALT: 19 U/L (ref 0–44)
AST: 43 U/L — ABNORMAL HIGH (ref 15–41)
Albumin: 2.6 g/dL — ABNORMAL LOW (ref 3.5–5.0)
Alkaline Phosphatase: 208 U/L — ABNORMAL HIGH (ref 38–126)
Bilirubin, Direct: 0.2 mg/dL (ref 0.0–0.2)
Indirect Bilirubin: 0.7 mg/dL (ref 0.3–0.9)
Total Bilirubin: 0.9 mg/dL (ref 0.3–1.2)
Total Protein: 8.1 g/dL (ref 6.5–8.1)

## 2022-05-19 LAB — SODIUM, URINE, RANDOM: Sodium, Ur: 54 mmol/L

## 2022-05-19 LAB — CREATININE, URINE, RANDOM: Creatinine, Urine: 38.6 mg/dL

## 2022-05-19 LAB — TROPONIN I (HIGH SENSITIVITY)
Troponin I (High Sensitivity): 3 ng/L (ref <18)
Troponin I (High Sensitivity): 3 ng/L (ref <18)

## 2022-05-19 MED ORDER — OXYCODONE HCL 5 MG PO TABS
5.0000 mg | ORAL_TABLET | ORAL | Status: DC | PRN
  Administered 2022-05-20 – 2022-05-22 (×7): 5 mg via ORAL
  Filled 2022-05-19 (×8): qty 1

## 2022-05-19 MED ORDER — MORPHINE SULFATE (PF) 4 MG/ML IV SOLN
4.0000 mg | Freq: Once | INTRAVENOUS | Status: AC
  Administered 2022-05-19: 4 mg via INTRAVENOUS
  Filled 2022-05-19: qty 1

## 2022-05-19 MED ORDER — HEPARIN SODIUM (PORCINE) 5000 UNIT/ML IJ SOLN
5000.0000 [IU] | Freq: Three times a day (TID) | INTRAMUSCULAR | Status: DC
  Administered 2022-05-20 – 2022-05-21 (×4): 5000 [IU] via SUBCUTANEOUS
  Filled 2022-05-19 (×5): qty 1

## 2022-05-19 MED ORDER — ONDANSETRON HCL 4 MG PO TABS: 4.0000 mg | ORAL_TABLET | Freq: Four times a day (QID) | ORAL | Status: DC | PRN

## 2022-05-19 MED ORDER — ACETAMINOPHEN 650 MG RE SUPP: 650.0000 mg | Freq: Four times a day (QID) | RECTAL | Status: DC | PRN

## 2022-05-19 MED ORDER — METOPROLOL TARTRATE 25 MG PO TABS
25.0000 mg | ORAL_TABLET | Freq: Two times a day (BID) | ORAL | Status: DC
  Administered 2022-05-20 – 2022-05-24 (×9): 25 mg via ORAL
  Filled 2022-05-19 (×9): qty 1

## 2022-05-19 MED ORDER — ONDANSETRON HCL 4 MG/2ML IJ SOLN
4.0000 mg | Freq: Once | INTRAMUSCULAR | Status: AC
  Administered 2022-05-19: 4 mg via INTRAVENOUS
  Filled 2022-05-19: qty 2

## 2022-05-19 MED ORDER — ACETAMINOPHEN 325 MG PO TABS: 650.0000 mg | ORAL_TABLET | Freq: Four times a day (QID) | ORAL | Status: DC | PRN

## 2022-05-19 MED ORDER — ONDANSETRON HCL 4 MG/2ML IJ SOLN
4.0000 mg | Freq: Four times a day (QID) | INTRAMUSCULAR | Status: DC | PRN
  Administered 2022-05-20 – 2022-05-21 (×2): 4 mg via INTRAVENOUS
  Filled 2022-05-19 (×2): qty 2

## 2022-05-19 MED ORDER — MOMETASONE FURO-FORMOTEROL FUM 200-5 MCG/ACT IN AERO
2.0000 | INHALATION_SPRAY | Freq: Two times a day (BID) | RESPIRATORY_TRACT | Status: DC
  Administered 2022-05-20 – 2022-05-24 (×9): 2 via RESPIRATORY_TRACT
  Filled 2022-05-19: qty 8.8

## 2022-05-19 MED ORDER — SODIUM CHLORIDE 0.9 % IV SOLN: INTRAVENOUS | Status: DC

## 2022-05-19 MED ORDER — SODIUM CHLORIDE 0.9 % IV BOLUS
1000.0000 mL | Freq: Once | INTRAVENOUS | Status: AC
  Administered 2022-05-19: 1000 mL via INTRAVENOUS

## 2022-05-19 MED ORDER — MORPHINE SULFATE (PF) 2 MG/ML IV SOLN: 2.0000 mg | INTRAVENOUS | Status: DC | PRN

## 2022-05-19 MED ORDER — ASPIRIN 81 MG PO CHEW
81.0000 mg | CHEWABLE_TABLET | Freq: Every day | ORAL | Status: DC
  Administered 2022-05-20 – 2022-05-24 (×5): 81 mg via ORAL
  Filled 2022-05-19 (×5): qty 1

## 2022-05-19 MED ORDER — SODIUM CHLORIDE 0.9 % IV SOLN
1.0000 g | Freq: Once | INTRAVENOUS | Status: AC
  Administered 2022-05-19: 1 g via INTRAVENOUS
  Filled 2022-05-19: qty 10

## 2022-05-19 MED ORDER — SPIRONOLACTONE 25 MG PO TABS
100.0000 mg | ORAL_TABLET | Freq: Every day | ORAL | Status: DC
  Administered 2022-05-20 – 2022-05-24 (×5): 100 mg via ORAL
  Filled 2022-05-19 (×5): qty 4

## 2022-05-19 NOTE — Progress Notes (Signed)
Patient ID: [name] [name], male   DOB: May 14, 1965, 57 y.o.   MRN: [id number]  I have reviewed the chart  and imaging at the request of Dr. Melina Copa.  He was found to have mild right hydroureteronephrosis on a CT done for right upper quadrant pain.  He does have a history of stones in the past.    His bladder is quite full on CT and this could contribute to the ureteral and renal dilation.. I would recommend a PVR and if high, he would probably need a foley with the AKI. The renal dilation is minimal and similar to the findings on a renal US in November.   On the [place] that was a possible polypoid posterior bladder wall lesion that could be a bladder tumor. I don't see it on the CT but that sort of thing can be hard to see on a CT. He will need a cystocopy at some point, probably as office outpatient follow up. Other than a possible foley, I don't see the need for any intervention acutely.   I will notify the office to arrange follow up.

## 2022-05-19 NOTE — ED Provider Notes (Signed)
[facility] Provider Note   CSN: [id number] Arrival date & time: 05/19/22  1519     History  Chief Complaint  Patient presents with   Chest Pain    [name] [name] is a 57 y.o. male.  He is complaining of his right lower chest into back that started 5 days ago associated with unable to eat no appetite.  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  He said they told him he needed his gallbladder taken out at 1 point.  He did have some diarrhea earlier none now.  He has had vomiting nonbloody.  No fevers or chills no shortness of breath.  The history is provided by the patient.  Chest Pain Pain location:  R chest Pain quality: aching and sharp   Pain radiates to:  Mid back Pain severity:  Moderate Onset quality:  Gradual Duration:  5 days Timing:  Constant Progression:  Unchanged Chronicity:  New Relieved by:  None tried Worsened by:  Nothing Ineffective treatments:  None tried Associated symptoms: abdominal pain, back pain, nausea and vomiting   Associated symptoms: no cough, no diaphoresis, no fever and no shortness of breath   Risk factors: smoking        Home Medications Prior to Admission medications   Medication Sig Start Date End Date Taking? Authorizing Provider  acetaminophen (TYLENOL) 500 MG tablet Take 1,000 mg by mouth every 6 (six) hours as needed for moderate pain.    [provider]  albuterol (PROVENTIL HFA) 108 (90 Base) MCG/ACT inhaler INHALE 2 PUFFS BY MOUTH EVERY 6 HOURS AS NEEDED FOR COUGHING, WHEEZING, OR SHORTNESS OF BREATH 02/10/21   Roxan Hockey, MD  aspirin 81 MG chewable tablet Chew 81 mg by mouth daily.    [provider]  Cyanocobalamin (B-12 COMPLIANCE INJECTION IJ) Inject 1 Dose as directed every 30 (thirty) days.    [provider]  folic acid (FOLVITE) 854 MCG tablet Take 1 tablet (400 mcg total) by mouth daily. 03/15/22   [name], PA-C  furosemide (LASIX) 20 MG tablet Take 2 tablets in morning and 1 tablet in the afternoon. (40 milligrams in morning and 20 milligrams in afternoon). 07/13/21   Annitta Needs, NP  Menthol-Methyl Salicylate (MUSCLE RUB) 10-15 % CREA Apply 1 Application topically as needed for muscle pain.    [provider]  metoprolol tartrate (LOPRESSOR) 25 MG tablet Take 25 mg by mouth 2 (two) times daily. 05/24/21   [provider]  mometasone-formoterol (DULERA) 200-5 MCG/ACT AERO Inhale 2 puffs into the lungs 2 (two) times daily. 02/10/21   Emokpae, Courage, MD  ondansetron (ZOFRAN-ODT) 4 MG disintegrating tablet 4 mg every 8 (eight) hours as needed. 04/17/22   [provider]  pantoprazole (PROTONIX) 40 MG tablet Take 1 tablet (40 mg total) by mouth daily. 09/01/21 09/01/22  Annitta Needs, NP  sodium bicarbonate 650 MG tablet Take 650 mg by mouth 2 (two) times daily.    [provider]  spironolactone (ALDACTONE) 50 MG tablet Take 50-100 mg by mouth See admin instructions. Take 100 mg by mouth in morning and 50 mg in the afternoon    [provider]      Allergies    Patient has no known allergies.    Review of Systems   Review of Systems  Constitutional:  Negative for diaphoresis and fever.  HENT:  Negative for sore throat.  Respiratory:  Negative for cough and shortness of breath.   Cardiovascular:  Positive for chest pain.  Gastrointestinal:  Positive for abdominal pain, nausea and vomiting.  Genitourinary:  Negative for dysuria.  Musculoskeletal:  Positive for back pain.  Skin:  Negative for rash.    Physical Exam Updated Vital Signs BP 91/72 (BP Location: Right Arm)   Pulse 84   Temp 98 F (36.7 C) (Oral)   Resp 18   Ht 6' (1.829 m)   Wt 77.1 kg   SpO2 91%   BMI 23.06 kg/m  Physical Exam Vitals and nursing note reviewed.  Constitutional:      General: He is not in acute distress.    Appearance: He is well-developed.  HENT:      Head: Normocephalic and atraumatic.  Eyes:     Conjunctiva/sclera: Conjunctivae normal.  Cardiovascular:     Rate and Rhythm: Normal rate and regular rhythm.     Heart sounds: No murmur heard. Pulmonary:     Effort: Pulmonary effort is normal. No respiratory distress.     Breath sounds: Normal breath sounds.  Abdominal:     Palpations: Abdomen is soft.     Tenderness: There is no abdominal tenderness.  Musculoskeletal:        General: No swelling. Normal range of motion.     Cervical back: Neck supple.     Right lower leg: No tenderness.     Left lower leg: No tenderness.  Skin:    General: Skin is warm and dry.     Capillary Refill: Capillary refill takes less than 2 seconds.  Neurological:     General: No focal deficit present.     Mental Status: He is alert.     ED Results / Procedures / Treatments   Labs (all labs ordered are listed, but only abnormal results are displayed) Labs Reviewed  BASIC METABOLIC PANEL - Abnormal; Notable for the following components:      Result Value   Sodium 117 (*)    Potassium 5.2 (*)    Chloride 87 (*)    CO2 17 (*)    BUN 59 (*)    Creatinine, Ser 3.32 (*)    Calcium 8.4 (*)    GFR, Estimated 21 (*)    All other components within normal limits  URINALYSIS, ROUTINE W REFLEX MICROSCOPIC - Abnormal; Notable for the following components:   APPearance HAZY (*)    Hgb urine dipstick SMALL (*)    Protein, ur 100 (*)    Leukocytes,Ua LARGE (*)    WBC, UA >50 (*)    Bacteria, UA RARE (*)    All other components within normal limits  HEPATIC FUNCTION PANEL - Abnormal; Notable for the following components:   Albumin 2.6 (*)    AST 43 (*)    Alkaline Phosphatase 208 (*)    All other components within normal limits  BASIC METABOLIC PANEL - Abnormal; Notable for the following components:   Sodium 121 (*)    Potassium 5.4 (*)    Chloride 93 (*)    CO2 17 (*)    BUN 57 (*)    Creatinine, Ser 2.91 (*)    Calcium 7.5 (*)    GFR,  Estimated 25 (*)    All other components within normal limits  BASIC METABOLIC PANEL - Abnormal; Notable for the following components:   Sodium 124 (*)    CO2 17 (*)    BUN 58 (*)    Creatinine,  Ser 2.57 (*)    Calcium 7.8 (*)    GFR, Estimated 28 (*)    All other components within normal limits  COMPREHENSIVE METABOLIC PANEL - Abnormal; Notable for the following components:   Sodium 123 (*)    CO2 18 (*)    BUN 60 (*)    Creatinine, Ser 2.60 (*)    Calcium 7.7 (*)    Albumin 2.3 (*)    AST 44 (*)    Alkaline Phosphatase 190 (*)    GFR, Estimated 28 (*)    All other components within normal limits  MAGNESIUM - Abnormal; Notable for the following components:   Magnesium 1.6 (*)    All other components within normal limits  CBC WITH DIFFERENTIAL/PLATELET - Abnormal; Notable for the following components:   RBC 3.73 (*)    Hemoglobin 12.0 (*)    HCT 35.5 (*)    Monocytes Absolute 1.3 (*)    All other components within normal limits  RESP PANEL BY RT-PCR (RSV, FLU A&B, COVID)  RVPGX2  URINE CULTURE  CBC  SODIUM, URINE, RANDOM  CREATININE, URINE, RANDOM  BASIC METABOLIC PANEL  BASIC METABOLIC PANEL  BASIC METABOLIC PANEL  TROPONIN I (HIGH SENSITIVITY)  TROPONIN I (HIGH SENSITIVITY)    EKG EKG Interpretation  Date/Time:  Friday May 19 2022 15:31:09 EST Ventricular Rate:  92 PR Interval:  148 QRS Duration: 88 QT Interval:  340 QTC Calculation: 420 R Axis:   -21 Text Interpretation: Normal sinus rhythm Biatrial enlargement Nonspecific T wave abnormality Abnormal ECG When compared with ECG of 11-Apr-2022 11:02, T wave inversion now evident in Anterior leads Confirmed by Aletta Edouard [phone]) on 05/19/2022 3:57:36 PM  Radiology CT Renal Stone Study  Result Date: 05/19/2022 CLINICAL DATA:  Right abdominal and flank pain with stone suspected. No appetite for a week. Low back pain. EXAM: CT ABDOMEN AND PELVIS WITHOUT CONTRAST TECHNIQUE: Multidetector CT imaging of the  abdomen and pelvis was performed following the standard protocol without IV contrast. RADIATION DOSE REDUCTION: This exam was performed according to the departmental dose-optimization program which includes automated exposure control, adjustment of the mA and/or kV according to patient size and/or use of iterative reconstruction technique. COMPARISON:  MRI abdomen 06/29/2021. CT abdomen and pelvis 05/27/2021 FINDINGS: Lower chest: Lung bases are clear. Hepatobiliary: Enlargement of the lateral segment left lobe of liver and nodular liver contour suggests cirrhosis. No focal lesions are demonstrated. Small stones in the gallbladder. No wall thickening or inflammatory changes. No bile duct dilatation. Pancreas: The head of the pancreas appears prominent. This could be due to noncontrast imaging with partial voluming to adjacent structures but it appears more prominent than on the previous study. Given the history of loss of appetite, contrast-enhanced imaging is recommended to exclude a pancreatic mass or focal pancreatitis. Consider follow-up with contrast-enhanced CT or MRI. No pancreatic ductal dilatation. Spleen: Normal in size without focal abnormality. Adrenals/Urinary Tract: No adrenal gland nodules. Left renal atrophy. Left intrarenal stones measuring up to 2-3 mm. No hydronephrosis or hydroureter. There is mild right hydronephrosis and hydroureter down to the level of the bladder. No discrete stones are demonstrated. This could represent sequela of recently passed stone, reflux, or occult non radiopaque stone. Bladder is normal. Stomach/Bowel: Stomach, small bowel, and colon are not abnormally distended. No wall thickening or inflammatory changes. Scattered colonic diverticula without evidence of acute diverticulitis. Appendix is not visualized. Vascular/Lymphatic: Aortic atherosclerosis. No enlarged abdominal or pelvic lymph nodes. Reproductive: Prostate is unremarkable. Other: Interval  resolution of  previous ascites. No free air or free fluid is identified today. Abdominal wall musculature appears intact. Musculoskeletal: Acute appearing fractures of the left posterior tenth and eleventh ribs. IMPRESSION: 1. Changes of hepatic cirrhosis. 2. Cholelithiasis without evidence of acute cholecystitis. 3. Nonspecific prominent appearance of the head of the pancreas, possibly artifactual but could indicate pancreatic mass or focal pancreatitis. Consider follow-up with contrast-enhanced CT or MRI. 4. Nonobstructing intrarenal stones on the left. 5. Right hydronephrosis and hydroureter. No right ureteral stones are identified. Changes could indicate recently passed stone, reflux disease, or occult non radiopaque stone. 6. Aortic atherosclerosis. 7. Acute fractures of the left posterior tenth and eleventh ribs. Electronically Signed   By: Lucienne Capers M.D.   On: 05/19/2022 20:18   DG Chest 2 View  Result Date: 05/19/2022 CLINICAL DATA:  Chest pain. Right-sided chest pain and low back pain. EXAM: CHEST - 2 VIEW COMPARISON:  05/30/2021. FINDINGS: Clear lungs. Normal heart size and mediastinal contours. No pleural effusion or pneumothorax. Visualized bones and upper abdomen are unremarkable. IMPRESSION: No acute cardiopulmonary disease. Electronically Signed   By: Emmit Alexanders M.D.   On: 05/19/2022 16:06    Procedures .Critical Care  Performed by: Hayden Rasmussen, MD Authorized by: Hayden Rasmussen, MD   Critical care provider statement:    Critical care time (minutes):  45   Critical care time was exclusive of:  Separately billable procedures and treating other patients   Critical care was necessary to treat or prevent imminent or life-threatening deterioration of the following conditions:  Metabolic crisis and renal failure   Critical care was time spent personally by me on the following activities:  Development of treatment plan with patient or surrogate, discussions with consultants, evaluation  of patient's response to treatment, examination of patient, obtaining history from patient or surrogate, ordering and performing treatments and interventions, ordering and review of laboratory studies, ordering and review of radiographic studies, pulse oximetry, re-evaluation of patient's condition and review of old charts   I assumed direction of critical care for this patient from another provider in my specialty: no       Medications Ordered in ED Medications  aspirin chewable tablet 81 mg (has no administration in time range)  metoprolol tartrate (LOPRESSOR) tablet 25 mg (25 mg Oral Not Given 05/20/22 0026)  spironolactone (ALDACTONE) tablet 100 mg (has no administration in time range)  mometasone-formoterol (DULERA) 200-5 MCG/ACT inhaler 2 puff (2 puffs Inhalation Given 05/20/22 0921)  heparin injection 5,000 Units (has no administration in time range)  acetaminophen (TYLENOL) tablet 650 mg (has no administration in time range)    Or  acetaminophen (TYLENOL) suppository 650 mg (has no administration in time range)  oxyCODONE (Oxy IR/ROXICODONE) immediate release tablet 5 mg (has no administration in time range)  ondansetron (ZOFRAN) tablet 4 mg ( Oral See Alternative 05/20/22 0025)    Or  ondansetron (ZOFRAN) injection 4 mg (4 mg Intravenous Given 05/20/22 0025)  spironolactone (ALDACTONE) tablet 50 mg (has no administration in time range)  LORazepam (ATIVAN) tablet 1-4 mg (has no administration in time range)    Or  LORazepam (ATIVAN) injection 1-4 mg (has no administration in time range)  thiamine (VITAMIN B1) tablet 100 mg (has no administration in time range)    Or  thiamine (VITAMIN B1) injection 100 mg (has no administration in time range)  folic acid (FOLVITE) tablet 1 mg (has no administration in time range)  multivitamin with minerals tablet 1 tablet (has no  administration in time range)  nicotine (NICODERM CQ - dosed in mg/24 hours) patch 14 mg (has no administration in time  range)  cefTRIAXone (ROCEPHIN) 1 g in sodium chloride 0.9 % 100 mL IVPB (has no administration in time range)  magnesium oxide (MAG-OX) tablet 800 mg (has no administration in time range)  sodium chloride 0.9 % bolus 1,000 mL (0 mLs Intravenous Stopped 05/19/22 2048)  morphine (PF) 4 MG/ML injection 4 mg (4 mg Intravenous Given 05/19/22 1952)  ondansetron (ZOFRAN) injection 4 mg (4 mg Intravenous Given 05/19/22 1952)  cefTRIAXone (ROCEPHIN) 1 g in sodium chloride 0.9 % 100 mL IVPB (0 g Intravenous Stopped 05/19/22 2235)    ED Course/ Medical Decision Making/ A&P Clinical Course as of 05/20/22 0940  Fri May 19, 2022  2151 Reviewed CT findings and workup with Dr. Jeffie Pollock from urology.  He did not feel strongly that the patient needed admission down at [facility] and felt he could be managed up here. [MB]    Clinical Course User Index [MB] Hayden Rasmussen, MD                             Medical Decision Making Amount and/or Complexity of Data Reviewed Labs: ordered. Radiology: ordered.  Risk Prescription drug management. Decision regarding hospitalization.   This patient complains of right upper quadrant right chest pain radiating through to the back, poor intake; this involves an extensive number of treatment Options and is a complaint that carries with it a high risk of complications and morbidity. The differential includes cholelithiasis cholecystitis renal colic pyelonephritis, ACS, pneumonia, pneumothorax  I ordered, reviewed and interpreted labs, which included CBC with normal white count normal hemoglobin, chemistries with low sodium AKI, urinalysis possible signs of infection sent for culture, COVID and flu negative, LFTs not significantly changed from priors I ordered medication IV fluids IV antibiotics IV pain medication and reviewed PMP when indicated. I ordered imaging studies which included chest x-ray and CT renal and I independently    visualized and interpreted imaging  which showed mild hydro on the right, cholelithiasis no evidence of cholecystitis, cirrhosis changes Previous records obtained and reviewed in epic including discharge summary and recent GI notes I consulted urology Dr. Jeffie Pollock and [facility] hospitalist Dr Josph Macho and discussed lab and imaging findings and discussed disposition.  Cardiac monitoring reviewed, normal sinus rhythm Social determinants considered, patient with financial insecurity ongoing tobacco use Critical Interventions: Workup and management of patient's metabolic derangement  After the interventions stated above, I reevaluated the patient and found patient to be resting comfortably in no distress Admission and further testing considered, he would benefit from mission to the hospital for further workup.  Patient in agreement with plan.         Final Clinical Impression(s) / ED Diagnoses Final diagnoses:  Hyponatremia  AKI (acute kidney injury) ([place])  Calculus of gallbladder without cholecystitis without obstruction    Rx / DC Orders ED Discharge Orders     None         Hayden Rasmussen, MD 05/20/22 [phone]

## 2022-05-19 NOTE — ED Notes (Signed)
Pt is in bathroom attempting to produce a urine sample.

## 2022-05-19 NOTE — ED Triage Notes (Signed)
Pt reports right side chest pain, low back pain and no appetite for about a week.

## 2022-05-20 DIAGNOSIS — Z833 Family history of diabetes mellitus: Secondary | ICD-10-CM | POA: Diagnosis not present

## 2022-05-20 DIAGNOSIS — E8809 Other disorders of plasma-protein metabolism, not elsewhere classified: Secondary | ICD-10-CM | POA: Diagnosis present

## 2022-05-20 DIAGNOSIS — Z6823 Body mass index (BMI) 23.0-23.9, adult: Secondary | ICD-10-CM | POA: Diagnosis not present

## 2022-05-20 DIAGNOSIS — F1011 Alcohol abuse, in remission: Secondary | ICD-10-CM | POA: Diagnosis present

## 2022-05-20 DIAGNOSIS — E778 Other disorders of glycoprotein metabolism: Secondary | ICD-10-CM | POA: Diagnosis present

## 2022-05-20 DIAGNOSIS — E440 Moderate protein-calorie malnutrition: Secondary | ICD-10-CM | POA: Diagnosis present

## 2022-05-20 DIAGNOSIS — I10 Essential (primary) hypertension: Secondary | ICD-10-CM | POA: Diagnosis present

## 2022-05-20 DIAGNOSIS — E860 Dehydration: Secondary | ICD-10-CM | POA: Diagnosis present

## 2022-05-20 DIAGNOSIS — K7031 Alcoholic cirrhosis of liver with ascites: Secondary | ICD-10-CM

## 2022-05-20 DIAGNOSIS — K852 Alcohol induced acute pancreatitis without necrosis or infection: Secondary | ICD-10-CM | POA: Diagnosis not present

## 2022-05-20 DIAGNOSIS — J45909 Unspecified asthma, uncomplicated: Secondary | ICD-10-CM | POA: Diagnosis present

## 2022-05-20 DIAGNOSIS — E871 Hypo-osmolality and hyponatremia: Secondary | ICD-10-CM | POA: Diagnosis present

## 2022-05-20 DIAGNOSIS — N3000 Acute cystitis without hematuria: Secondary | ICD-10-CM

## 2022-05-20 DIAGNOSIS — D539 Nutritional anemia, unspecified: Secondary | ICD-10-CM | POA: Diagnosis present

## 2022-05-20 DIAGNOSIS — F17210 Nicotine dependence, cigarettes, uncomplicated: Secondary | ICD-10-CM | POA: Diagnosis present

## 2022-05-20 DIAGNOSIS — E861 Hypovolemia: Secondary | ICD-10-CM | POA: Diagnosis present

## 2022-05-20 DIAGNOSIS — Z86010 Personal history of colonic polyps: Secondary | ICD-10-CM | POA: Diagnosis not present

## 2022-05-20 DIAGNOSIS — K859 Acute pancreatitis without necrosis or infection, unspecified: Secondary | ICD-10-CM | POA: Diagnosis not present

## 2022-05-20 DIAGNOSIS — K8020 Calculus of gallbladder without cholecystitis without obstruction: Secondary | ICD-10-CM | POA: Diagnosis present

## 2022-05-20 DIAGNOSIS — K219 Gastro-esophageal reflux disease without esophagitis: Secondary | ICD-10-CM

## 2022-05-20 DIAGNOSIS — N136 Pyonephrosis: Secondary | ICD-10-CM | POA: Diagnosis present

## 2022-05-20 DIAGNOSIS — N179 Acute kidney failure, unspecified: Secondary | ICD-10-CM | POA: Diagnosis present

## 2022-05-20 DIAGNOSIS — Z87442 Personal history of urinary calculi: Secondary | ICD-10-CM | POA: Diagnosis not present

## 2022-05-20 DIAGNOSIS — E7800 Pure hypercholesterolemia, unspecified: Secondary | ICD-10-CM | POA: Diagnosis present

## 2022-05-20 DIAGNOSIS — Z91199 Patient's noncompliance with other medical treatment and regimen due to unspecified reason: Secondary | ICD-10-CM | POA: Diagnosis not present

## 2022-05-20 DIAGNOSIS — Z1152 Encounter for screening for COVID-19: Secondary | ICD-10-CM | POA: Diagnosis not present

## 2022-05-20 DIAGNOSIS — F17200 Nicotine dependence, unspecified, uncomplicated: Secondary | ICD-10-CM | POA: Insufficient documentation

## 2022-05-20 LAB — CBC WITH DIFFERENTIAL/PLATELET
Abs Immature Granulocytes: 0.04 10*3/uL (ref 0.00–0.07)
Basophils Absolute: 0.1 10*3/uL (ref 0.0–0.1)
Basophils Relative: 1 %
Eosinophils Absolute: 0 10*3/uL (ref 0.0–0.5)
Eosinophils Relative: 0 %
HCT: 35.5 % — ABNORMAL LOW (ref 39.0–52.0)
Hemoglobin: 12 g/dL — ABNORMAL LOW (ref 13.0–17.0)
Immature Granulocytes: 1 %
Lymphocytes Relative: 18 %
Lymphs Abs: 1.4 10*3/uL (ref 0.7–4.0)
MCH: 32.2 pg (ref 26.0–34.0)
MCHC: 33.8 g/dL (ref 30.0–36.0)
MCV: 95.2 fL (ref 80.0–100.0)
Monocytes Absolute: 1.3 10*3/uL — ABNORMAL HIGH (ref 0.1–1.0)
Monocytes Relative: 16 %
Neutro Abs: 5.2 10*3/uL (ref 1.7–7.7)
Neutrophils Relative %: 64 %
Platelets: 259 10*3/uL (ref 150–400)
RBC: 3.73 MIL/uL — ABNORMAL LOW (ref 4.22–5.81)
RDW: 13.5 % (ref 11.5–15.5)
WBC: 8 10*3/uL (ref 4.0–10.5)
nRBC: 0 % (ref 0.0–0.2)

## 2022-05-20 LAB — BASIC METABOLIC PANEL
Anion gap: 8 (ref 5–15)
Anion gap: 8 (ref 5–15)
Anion gap: 8 (ref 5–15)
Anion gap: 8 (ref 5–15)
BUN: 58 mg/dL — ABNORMAL HIGH (ref 6–20)
BUN: 56 mg/dL — ABNORMAL HIGH (ref 6–20)
BUN: 57 mg/dL — ABNORMAL HIGH (ref 6–20)
BUN: 58 mg/dL — ABNORMAL HIGH (ref 6–20)
CO2: 17 mmol/L — ABNORMAL LOW (ref 22–32)
CO2: 17 mmol/L — ABNORMAL LOW (ref 22–32)
CO2: 16 mmol/L — ABNORMAL LOW (ref 22–32)
CO2: 18 mmol/L — ABNORMAL LOW (ref 22–32)
Calcium: 7.8 mg/dL — ABNORMAL LOW (ref 8.9–10.3)
Calcium: 7.9 mg/dL — ABNORMAL LOW (ref 8.9–10.3)
Calcium: 8 mg/dL — ABNORMAL LOW (ref 8.9–10.3)
Calcium: 7.8 mg/dL — ABNORMAL LOW (ref 8.9–10.3)
Chloride: 99 mmol/L (ref 98–111)
Chloride: 100 mmol/L (ref 98–111)
Chloride: 102 mmol/L (ref 98–111)
Chloride: 99 mmol/L (ref 98–111)
Creatinine, Ser: 2.57 mg/dL — ABNORMAL HIGH (ref 0.61–1.24)
Creatinine, Ser: 2.69 mg/dL — ABNORMAL HIGH (ref 0.61–1.24)
Creatinine, Ser: 2.53 mg/dL — ABNORMAL HIGH (ref 0.61–1.24)
Creatinine, Ser: 2.39 mg/dL — ABNORMAL HIGH (ref 0.61–1.24)
GFR, Estimated: 28 mL/min — ABNORMAL LOW (ref >60)
GFR, Estimated: 27 mL/min — ABNORMAL LOW (ref >60)
GFR, Estimated: 29 mL/min — ABNORMAL LOW (ref >60)
GFR, Estimated: 31 mL/min — ABNORMAL LOW (ref >60)
Glucose, Bld: 96 mg/dL (ref 70–99)
Glucose, Bld: 127 mg/dL — ABNORMAL HIGH (ref 70–99)
Glucose, Bld: 124 mg/dL — ABNORMAL HIGH (ref 70–99)
Glucose, Bld: 151 mg/dL — ABNORMAL HIGH (ref 70–99)
Potassium: 5 mmol/L (ref 3.5–5.1)
Potassium: 5.1 mmol/L (ref 3.5–5.1)
Potassium: 4.9 mmol/L (ref 3.5–5.1)
Potassium: 4.3 mmol/L (ref 3.5–5.1)
Sodium: 124 mmol/L — ABNORMAL LOW (ref 135–145)
Sodium: 125 mmol/L — ABNORMAL LOW (ref 135–145)
Sodium: 126 mmol/L — ABNORMAL LOW (ref 135–145)
Sodium: 125 mmol/L — ABNORMAL LOW (ref 135–145)

## 2022-05-20 LAB — COMPREHENSIVE METABOLIC PANEL
ALT: 17 U/L (ref 0–44)
AST: 44 U/L — ABNORMAL HIGH (ref 15–41)
Albumin: 2.3 g/dL — ABNORMAL LOW (ref 3.5–5.0)
Alkaline Phosphatase: 190 U/L — ABNORMAL HIGH (ref 38–126)
Anion gap: 7 (ref 5–15)
BUN: 60 mg/dL — ABNORMAL HIGH (ref 6–20)
CO2: 18 mmol/L — ABNORMAL LOW (ref 22–32)
Calcium: 7.7 mg/dL — ABNORMAL LOW (ref 8.9–10.3)
Chloride: 98 mmol/L (ref 98–111)
Creatinine, Ser: 2.6 mg/dL — ABNORMAL HIGH (ref 0.61–1.24)
GFR, Estimated: 28 mL/min — ABNORMAL LOW (ref >60)
Glucose, Bld: 97 mg/dL (ref 70–99)
Potassium: 5 mmol/L (ref 3.5–5.1)
Sodium: 123 mmol/L — ABNORMAL LOW (ref 135–145)
Total Bilirubin: 0.6 mg/dL (ref 0.3–1.2)
Total Protein: 7 g/dL (ref 6.5–8.1)

## 2022-05-20 LAB — MAGNESIUM: Magnesium: 1.6 mg/dL — ABNORMAL LOW (ref 1.7–2.4)

## 2022-05-20 MED ORDER — LORAZEPAM 1 MG PO TABS: 1.0000 mg | ORAL_TABLET | ORAL | Status: AC | PRN

## 2022-05-20 MED ORDER — THIAMINE HCL 100 MG/ML IJ SOLN: 100.0000 mg | Freq: Every day | INTRAMUSCULAR | Status: DC

## 2022-05-20 MED ORDER — SPIRONOLACTONE 25 MG PO TABS
50.0000 mg | ORAL_TABLET | Freq: Every evening | ORAL | Status: DC
  Administered 2022-05-20 – 2022-05-23 (×4): 50 mg via ORAL
  Filled 2022-05-20 (×4): qty 2

## 2022-05-20 MED ORDER — LORAZEPAM 2 MG/ML IJ SOLN
1.0000 mg | INTRAMUSCULAR | Status: AC | PRN
  Administered 2022-05-21: 2 mg via INTRAVENOUS
  Filled 2022-05-20: qty 1

## 2022-05-20 MED ORDER — FOLIC ACID 1 MG PO TABS
1.0000 mg | ORAL_TABLET | Freq: Every day | ORAL | Status: DC
  Administered 2022-05-20 – 2022-05-24 (×5): 1 mg via ORAL
  Filled 2022-05-20 (×5): qty 1

## 2022-05-20 MED ORDER — ADULT MULTIVITAMIN W/MINERALS CH
1.0000 | ORAL_TABLET | Freq: Every day | ORAL | Status: DC
  Administered 2022-05-20 – 2022-05-24 (×5): 1 via ORAL
  Filled 2022-05-20 (×5): qty 1

## 2022-05-20 MED ORDER — NICOTINE 14 MG/24HR TD PT24
14.0000 mg | MEDICATED_PATCH | Freq: Every day | TRANSDERMAL | Status: DC
  Administered 2022-05-20 – 2022-05-24 (×5): 14 mg via TRANSDERMAL
  Filled 2022-05-20 (×5): qty 1

## 2022-05-20 MED ORDER — SODIUM CHLORIDE 0.9 % IV SOLN
1.0000 g | INTRAVENOUS | Status: DC
  Administered 2022-05-20 – 2022-05-23 (×4): 1 g via INTRAVENOUS
  Filled 2022-05-20 (×4): qty 10

## 2022-05-20 MED ORDER — THIAMINE MONONITRATE 100 MG PO TABS
100.0000 mg | ORAL_TABLET | Freq: Every day | ORAL | Status: DC
  Administered 2022-05-20 – 2022-05-24 (×5): 100 mg via ORAL
  Filled 2022-05-20 (×5): qty 1

## 2022-05-20 MED ORDER — MAGNESIUM OXIDE -MG SUPPLEMENT 400 (240 MG) MG PO TABS
800.0000 mg | ORAL_TABLET | Freq: Two times a day (BID) | ORAL | Status: AC
  Administered 2022-05-20 (×2): 800 mg via ORAL
  Filled 2022-05-20 (×2): qty 2

## 2022-05-20 NOTE — Assessment & Plan Note (Signed)
-  Patient reports his last drink was 2 weeks ago - Likely out of withdrawal and possibly had a seizure as he complains about a syncopal event a week ago - For completeness, will continue to monitor for CIWA

## 2022-05-20 NOTE — Assessment & Plan Note (Signed)
-  Holding PPI in the setting of AKI

## 2022-05-20 NOTE — Assessment & Plan Note (Signed)
-  Smokes half a pack per day - Nicotine patch ordered - Counseled on importance of cessation

## 2022-05-20 NOTE — Progress Notes (Addendum)
[name] [name] is a 57 y.o. male with medical history significant of asthma, B12 deficiency, alcoholic cirrhosis, hyperlipidemia, GERD, hypertension, and nephrolithiasis, and more presents ED with a chief complaint of back pain and chest pain.  Patient reports that the pain started 5 days ago.  When he says chest pain he points to his right upper quadrant.  He reports that the pain goes straight through to his back.  He describes the pain as sharp and constant.  He denies fever, dysuria, hematuria, shortness of breath, palpitations.  Patient reports he did have a syncopal event about a week ago.  Reports EMS came to his house but he did not choose to be transported to the hospital.  He quit drinking about 2 weeks ago, and the syncopal event could very well have been a seizure.  Patient reports that prior to that he was drinking a 40/day.  Patient reports on review of systems that he has had nausea and vomiting to many times to count.  It has been going on 2 or 3 weeks and usually happens in the morning.  Patient has not eaten in 5 days as he reports he has not had no appetite.  His last bowel movement was 3 to 4 days ago and there was no evidence of blood.  Patient has no other complaints at this time.   Patient currently smokes half a pack per day.  Counseled on importance of cessation.  Last drink was 2 weeks ago.  Prior to that he was drinking 40 ounces per day.  No illicit drugs.  Vaccinated for COVID and flu.  Full code.  05/20/2022: The patient was seen and examined at his bedside.  Having recurrent chest pain.  States it is usually improved with Tylenol.  Chest pain has been present for more than a week and a half.  Tolerating a diet well.  Physical exam: Frail-appearing in no acute distress.  He is alert oriented x 3. Respiratory: Lungs are clear to oscitation. Cardiac: Regular rate and rhythm no rubs or gallops. Extremities: No lower extremity edema bilaterally.  Chest pain, high-sensitivity  troponin negative x 2.  No evidence of acute ischemia on her EKG. Worsening AKI, creatinine is uptrending, restarting gentle IV fluid hydration.  Continue to encourage oral fluid hydration.  Monitor urine output. Presumptive UTI, admits to intermittent dysuria, continue IV antibiotics, empirically Rocephin. Mild right hydroureteronephrosis seen on CT scan, advised to keep follow-up appointment with urology.  The patient was receptive.  Hyponatremia: Sodium is improving, not on IV fluid.   Changed admission to inpatient status due to worsening AKI and hyponatremia.   Time: 15 minutes.

## 2022-05-20 NOTE — Progress Notes (Signed)
Alert and oriented.  Ate 4 nabs and drank gingerale with no vomiting.  Has been ambulating to bathroom for voiding and has voided in hat but keeps dumping urine at least 4 times. Last voiding was 200 in hat, yellow urine,no blood.  Scan showed 118 post void.  Dr. Clearence Ped aware of scan as she was making rounds at that point.

## 2022-05-20 NOTE — TOC Progression Note (Signed)
  Transition of Care [facility]) Screening Note   Patient Details  Name: [name] [name] Date of Birth: [birth year]   Transition of Care [facility]) CM/SW Contact:    Boneta Lucks, RN Phone Number: 05/20/2022, 9:05 AM  SA resources added to AVS.  Transition of Care [facility]) has reviewed patient and no TOC needs have been identified at this time. We will continue to monitor patient advancement through interdisciplinary progression rounds. If new patient transition needs arise, please place a TOC consult.       Barriers to Discharge: Continued Medical Work up     Social Determinants of Health (SDOH) Interventions SDOH Screenings   Food Insecurity: No Food Insecurity (05/20/2022)  Housing: Low Risk  (05/20/2022)  Transportation Needs: No Transportation Needs (05/20/2022)  Utilities: Not At Risk (05/20/2022)  Alcohol Screen: Low Risk  (09/13/2020)  Financial Resource Strain: High Risk (09/13/2020)  Physical Activity: Sufficiently Active (09/13/2020)  Social Connections: Moderately Isolated (09/13/2020)  Stress: No Stress Concern Present (09/13/2020)  Tobacco Use: High Risk (05/19/2022)    Readmission Risk Interventions     No data to display

## 2022-05-20 NOTE — H&P (Signed)
History and Physical    Patient: [name] [name] EML:[id number] DOB: 1966/03/17 DOA: 05/19/2022 DOS: the patient was seen and examined on 05/20/2022 PCP: Carrolyn Meiers, MD  Patient coming from: Home  Chief Complaint:  Chief Complaint  Patient presents with   Chest Pain   HPI: [name] is a 57 y.o. male with medical history significant of asthma, B12 deficiency, alcoholic cirrhosis, hyperlipidemia, GERD, hypertension, and nephrolithiasis, and more presents ED with a chief complaint of back pain and chest pain.  Patient reports that the pain started 5 days ago.  When he says chest pain he points to his right upper quadrant.  He reports that the pain goes straight through to his back.  He describes the pain as sharp and constant.  He denies fever, dysuria, hematuria, shortness of breath, palpitations.  Patient reports he did have a syncopal event about a week ago.  Reports EMS came to his house but he did not choose to be transported to the hospital.  He quit drinking about 2 weeks ago, and the syncopal event could very well have been a seizure.  Patient reports that prior to that he was drinking a 40/day.  Patient reports on review of systems that he has had nausea and vomiting to many times to count.  It has been going on 2 or 3 weeks and usually happens in the morning.  Patient has not eaten in 5 days as he reports he has not had no appetite.  His last bowel movement was 3 to 4 days ago and there was no evidence of blood.  Patient has no other complaints at this time.  Patient currently smokes half a pack per day.  Counseled on importance of cessation.  Last drink was 2 weeks ago.  Prior to that he was drinking 40 ounces per day.  No illicit drugs.  Vaccinated for COVID and flu.  Full code. Review of Systems: As mentioned in the history of present illness. All other systems reviewed and are negative. Past Medical History:  Diagnosis Date   Asthma    B12 deficiency 03/15/2022   Cirrhosis  ([place])    Dyspnea    High cholesterol    History of kidney stones    Hypertension    Kidney stones    Past Surgical History:  Procedure Laterality Date   APPENDECTOMY     BIOPSY  07/22/2020   Procedure: BIOPSY;  Surgeon: Daneil Dolin, MD;  [facility];  Service: Endoscopy;;   COLONOSCOPY WITH PROPOFOL N/A 04/18/2017   non-bleeding internal hemorrhoids, two 4-6 mm polyps in descending colon and cecum, pancolonic diverticulosis, single cecal AVM. Tubular adenomas, surveillance in 2023.    COLONOSCOPY WITH PROPOFOL N/A 04/13/2022   Procedure: COLONOSCOPY WITH PROPOFOL;  Surgeon: Daneil Dolin, MD;  [facility]: [facility];  Service: Endoscopy;  Laterality: N/A;  9:00 AM   ESOPHAGOGASTRODUODENOSCOPY (EGD) WITH PROPOFOL N/A 07/22/2020   normal esophagus, small hiatal hernia, abnormal gastric mucosa, abnormal appearing ampula and periampullary mucosa. Mild chronic gastritis.   ESOPHAGOGASTRODUODENOSCOPY (EGD) WITH PROPOFOL N/A 09/08/2021   normal esophagus, retained food in stomach and duodenum precluded complete examination.   FRACTURE SURGERY     left arm   IR PARACENTESIS  05/31/2021   LOWER EXTREMITY VENOGRAPHY N/A 08/10/2020   Procedure: LOWER EXTREMITY VENOGRAPHY;  Surgeon: Waynetta Sandy, MD;  Location: [place] [facility];  Service: Cardiovascular;  Laterality: N/A;   PERIPHERAL VASCULAR BALLOON ANGIOPLASTY Left 08/10/2020  Procedure: PERIPHERAL VASCULAR BALLOON ANGIOPLASTY;  Surgeon: Waynetta Sandy, MD;  Location: [place] [facility];  Service: Cardiovascular;  Laterality: Left;  lower extremity venous   PERIPHERAL VASCULAR THROMBECTOMY N/A 08/10/2020   Procedure: PERIPHERAL VASCULAR THROMBECTOMY;  Surgeon: Waynetta Sandy, MD;  Location: [place] [facility];  Service: Cardiovascular;  Laterality: N/A;   POLYPECTOMY  04/18/2017   Procedure: POLYPECTOMY;  Surgeon: Daneil Dolin, MD;  [facility];  Service: Endoscopy;;    POLYPECTOMY  04/13/2022   Procedure: POLYPECTOMY;  Surgeon: Daneil Dolin, MD;  [facility];  Service: Endoscopy;;   Social History:  reports that he has been smoking cigarettes. He has a 16.50 pack-year smoking history. He has never used smokeless tobacco. He reports that he does not currently use alcohol. He reports that he does not use drugs.  No Known Allergies  Family History  Problem Relation Age of Onset   Cancer Father        throat   Diabetes Sister    Colon cancer Neg Hx     Prior to Admission medications   Medication Sig Start Date End Date Taking? Authorizing Provider  acetaminophen (TYLENOL) 500 MG tablet Take 2,000 mg by mouth every 6 (six) hours as needed for moderate pain.   Yes [provider]  albuterol (PROVENTIL HFA) 108 (90 Base) MCG/ACT inhaler INHALE 2 PUFFS BY MOUTH EVERY 6 HOURS AS NEEDED FOR COUGHING, WHEEZING, OR SHORTNESS OF BREATH 02/10/21  Yes Emokpae, Courage, MD  aspirin 81 MG chewable tablet Chew 81 mg by mouth daily.   Yes [provider]  Cyanocobalamin (B-12 COMPLIANCE INJECTION IJ) Inject 1 Dose as directed every 30 (thirty) days.   Yes [provider]  furosemide (LASIX) 20 MG tablet Take 2 tablets in morning and 1 tablet in the afternoon. (40 milligrams in morning and 20 milligrams in afternoon). 07/13/21  Yes Annitta Needs, NP  Menthol-Methyl Salicylate (MUSCLE RUB) 10-15 % CREA Apply 1 Application topically as needed for muscle pain.   Yes [provider]  metoprolol tartrate (LOPRESSOR) 25 MG tablet Take 25 mg by mouth 2 (two) times daily. 05/24/21  Yes [provider]  mometasone-formoterol (DULERA) 200-5 MCG/ACT AERO Inhale 2 puffs into the lungs 2 (two) times daily. 02/10/21  Yes Emokpae, Courage, MD  pantoprazole (PROTONIX) 40 MG tablet Take 1 tablet (40 mg total) by mouth daily. 09/01/21 09/01/22 Yes Annitta Needs, NP  spironolactone (ALDACTONE) 50 MG tablet Take 50-100 mg by mouth See admin  instructions. Take 100 mg by mouth in morning and 50 mg in the afternoon   Yes [provider]  folic acid (FOLVITE) 660 MCG tablet Take 1 tablet (400 mcg total) by mouth daily. Patient not taking: Reported on 05/19/2022 03/15/22   [name], PA-C  ondansetron (ZOFRAN-ODT) 4 MG disintegrating tablet 4 mg every 8 (eight) hours as needed. Patient not taking: Reported on 05/19/2022 04/17/22   [provider]  sodium bicarbonate 650 MG tablet Take 650 mg by mouth 2 (two) times daily. Patient not taking: Reported on 05/19/2022    [provider]    Physical Exam: Vitals:   05/19/22 2110 05/19/22 2315 05/20/22 0006 05/20/22 0352  BP: 130/86 128/84 105/69 137/85  Pulse: 83 74 77 87  Resp: '16 19 16 20  '$ Temp: 98.5 F (36.9 C) 98.4 F (36.9 C) 98.9 F (37.2 C) 98.5 F (36.9 C)  TempSrc: Oral Oral Oral Oral  SpO2: 96% 100%  100% 100%  Weight:      Height:       1.  General: Patient lying supine in bed,  no acute distress   2. Psychiatric: Alert and oriented x 3, mood and behavior normal for situation, pleasant and cooperative with exam   3. Neurologic: Speech and language are normal, face is symmetric, moves all 4 extremities voluntarily, at baseline without acute deficits on limited exam   4. HEENMT:  Head is atraumatic, normocephalic, pupils reactive to light, neck is supple, trachea is midline, mucous membranes are moist   5. Respiratory : Lungs are clear to auscultation bilaterally without wheezing, rhonchi, rales, no cyanosis, no increase in work of breathing or accessory muscle use   6. Cardiovascular : Heart rate normal, rhythm is regular, no murmurs, rubs or gallops, no peripheral edema, peripheral pulses palpated   7. Gastrointestinal:  Right upper quadrant and left upper quadrant tenderness with voluntary guarding, mild distention with fluid wave, bowel sounds active   8. Skin:  Skin is warm, dry and intact without rashes, acute  lesions, or ulcers on limited exam   9.Musculoskeletal:  No acute deformities or trauma, no asymmetry in tone, no peripheral edema, peripheral pulses palpated, no tenderness to palpation in the extremities  Data Reviewed: In the ED Temp 98, heart rate 78-84, respiratory rate 16-18, blood pressure 91/72-130/93, satting 91-98% No leukocytosis with white blood cell count of 7.3, hemoglobin 13.8, platelets 336 Chemistry reveals a hyponatremia at 117, borderline hyperkalemia at 5.2, and a AKI with a creatinine of 3.32 Alk phos 208 Albumin 2.6 AST 43 ALT 19 Troponin 3, 3 UA is indicative of UTI, Rocephin started - CT renal study shows hepatic cirrhosis, cholelithiasis, possible pancreatic mass, right hydronephrosis with right hydroureter, and acute fracture of the 10th and 11th ribs  Assessment and Plan: * AKI (acute kidney injury) ([place]) - Increasing creatinine from 2.01>> 3.32 -Likely multifactorial-prerenal patient has had little appetite in the last 5 days, possible obstructive uropathy with reflux - CT renal study shows right hydronephrosis with hydroureter that could be indicative of the past stone or reflux - Urology recommends taking postvoid residual and if it is high, start a Foley.  Postvoid residual was 55 so no Foley was started - Urology would like patient to follow-up in office for cystoscopy  Alcoholic cirrhosis of liver with ascites ([place]) - Patient reports his last drink was 2 weeks ago - Likely out of withdrawal and possibly had a seizure as he complains about a syncopal event a week ago - For completeness, will continue to monitor for CIWA   GERD (gastroesophageal reflux disease) - Holding PPI in the setting of AKI  Tobacco use disorder - Smokes half a pack per day - Nicotine patch ordered - Counseled on importance of cessation  Cholelithiasis - CT renal study shows cholelithiasis but no signs of cholecystitis - Patient does have right upper quadrant tenderness  and has been told before that he should have a cholecystectomy - Right upper quadrant ultrasound this a.m.  Hyponatremia - Sodium 117 - Sodium improved to 121 after 1 liter bolus - Holding IV fluids so as to not overcorrect - Monitor BMP every 6 hours - Continue to monitor  UTI (urinary tract infection) - UA indicative of UTI - Urine culture pending - Rocephin started in the ED - Continue Rocephin, no previous susceptibilities to compare to      Advance Care Planning:   Code Status: Full Code   Consults: Urology was  consulted from the ED  Family Communication: No family at bedside  Severity of Illness: The appropriate patient status for this patient is OBSERVATION. Observation status is judged to be reasonable and necessary in order to provide the required intensity of service to ensure the patient's safety. The patient's presenting symptoms, physical exam findings, and initial radiographic and laboratory data in the context of their medical condition is felt to place them at decreased risk for further clinical deterioration. Furthermore, it is anticipated that the patient will be medically stable for discharge from the hospital within 2 midnights of admission.   Author: Rolla Plate, DO 05/20/2022 6:27 AM  For on call review [email or web address].

## 2022-05-20 NOTE — Assessment & Plan Note (Signed)
-  UA indicative of UTI - Urine culture pending - Rocephin started in the ED - Continue Rocephin, no previous susceptibilities to compare to

## 2022-05-20 NOTE — Assessment & Plan Note (Signed)
-  CT renal study shows cholelithiasis but no signs of cholecystitis - Patient does have right upper quadrant tenderness and has been told before that he should have a cholecystectomy - Right upper quadrant ultrasound this a.m.

## 2022-05-20 NOTE — Assessment & Plan Note (Signed)
-  Increasing creatinine from 2.01>> 3.32 -Likely multifactorial-prerenal patient has had little appetite in the last 5 days, possible obstructive uropathy with reflux - CT renal study shows right hydronephrosis with hydroureter that could be indicative of the past stone or reflux - Urology recommends taking postvoid residual and if it is high, start a Foley.  Postvoid residual was 55 so no Foley was started - Urology would like patient to follow-up in office for cystoscopy

## 2022-05-20 NOTE — Assessment & Plan Note (Signed)
-  Sodium 117 - Sodium improved to 121 after 1 liter bolus - Holding IV fluids so as to not overcorrect - Monitor BMP every 6 hours - Continue to monitor

## 2022-05-21 DIAGNOSIS — N179 Acute kidney failure, unspecified: Secondary | ICD-10-CM | POA: Diagnosis not present

## 2022-05-21 LAB — COMPREHENSIVE METABOLIC PANEL
ALT: 35 U/L (ref 0–44)
AST: 95 U/L — ABNORMAL HIGH (ref 15–41)
Albumin: 2.3 g/dL — ABNORMAL LOW (ref 3.5–5.0)
Alkaline Phosphatase: 261 U/L — ABNORMAL HIGH (ref 38–126)
Anion gap: 7 (ref 5–15)
BUN: 52 mg/dL — ABNORMAL HIGH (ref 6–20)
CO2: 19 mmol/L — ABNORMAL LOW (ref 22–32)
Calcium: 8.1 mg/dL — ABNORMAL LOW (ref 8.9–10.3)
Chloride: 102 mmol/L (ref 98–111)
Creatinine, Ser: 2.14 mg/dL — ABNORMAL HIGH (ref 0.61–1.24)
GFR, Estimated: 35 mL/min — ABNORMAL LOW (ref >60)
Glucose, Bld: 139 mg/dL — ABNORMAL HIGH (ref 70–99)
Potassium: 4.4 mmol/L (ref 3.5–5.1)
Sodium: 128 mmol/L — ABNORMAL LOW (ref 135–145)
Total Bilirubin: 0.6 mg/dL (ref 0.3–1.2)
Total Protein: 6.8 g/dL (ref 6.5–8.1)

## 2022-05-21 LAB — MAGNESIUM: Magnesium: 1.8 mg/dL (ref 1.7–2.4)

## 2022-05-21 LAB — CBC
HCT: 33.2 % — ABNORMAL LOW (ref 39.0–52.0)
Hemoglobin: 11.2 g/dL — ABNORMAL LOW (ref 13.0–17.0)
MCH: 32.6 pg (ref 26.0–34.0)
MCHC: 33.7 g/dL (ref 30.0–36.0)
MCV: 96.5 fL (ref 80.0–100.0)
Platelets: 241 10*3/uL (ref 150–400)
RBC: 3.44 MIL/uL — ABNORMAL LOW (ref 4.22–5.81)
RDW: 13.9 % (ref 11.5–15.5)
WBC: 7.6 10*3/uL (ref 4.0–10.5)
nRBC: 0 % (ref 0.0–0.2)

## 2022-05-21 LAB — PHOSPHORUS: Phosphorus: 2.7 mg/dL (ref 2.5–4.6)

## 2022-05-21 LAB — LIPASE, BLOOD: Lipase: 182 U/L — ABNORMAL HIGH (ref 11–51)

## 2022-05-21 MED ORDER — ENOXAPARIN SODIUM 40 MG/0.4ML IJ SOSY
40.0000 mg | PREFILLED_SYRINGE | INTRAMUSCULAR | Status: DC
  Administered 2022-05-21 – 2022-05-23 (×3): 40 mg via SUBCUTANEOUS
  Filled 2022-05-21 (×3): qty 0.4

## 2022-05-21 MED ORDER — SODIUM CHLORIDE 0.9 % IV SOLN: INTRAVENOUS | Status: AC

## 2022-05-21 NOTE — Progress Notes (Signed)
[name] Payment, LPN smelled smoke coming from patient's room, entered room and questioned the patient about smoking in the bathroom as patient has just left the bathroom, but patient denied smoking. When [name] [name] opened the door to the bathroom, she reported smoke coming out of the bathroom. When [name] [name] asked for his cigarettes, he gave her an empty pack and when she asked for the lighter, he reported not having one. After [name] [name] asked him how he was lighting the cigarettes, he handed over the lighter. Patient label was attached to lighter, and patient informed he could retrieve the lighter upon discharge. [name] [name] and this nurse educated patient on dangers of smoking in hospital and patient verbalized understanding. Dr. [place] Crane made aware of incident.

## 2022-05-21 NOTE — Progress Notes (Signed)
PROGRESS NOTE  [name] [name] HBZ:[id number] DOB: 03-10-1966 DOA: 05/19/2022 PCP: Carrolyn Meiers, MD  HPI/Recap of past 24 hours: [name] [name] is a 57 y.o. male with medical history significant of asthma, B12 deficiency, alcoholic cirrhosis, hyperlipidemia, GERD, hypertension, and nephrolithiasis, and more, who presents to the ED with a chief complaint of back pain and chest pain.  Patient reports that the pain started 5 days ago.  Pointing to his right upper quadrant, radiating to his back.  Sharp and constant.  Quit drinking about 2 weeks ago.  Endorses a recent fall.  Unclear if he lost consciousness.    Patient currently smokes half a pack per day.  Counseled on importance of tobacco cessation.     05/21/2022: Endorses persistent intermittent right upper quadrant abdominal pain radiating to his back.    Assessment/Plan: Principal Problem:   AKI (acute kidney injury) ([place]) Active Problems:   Alcoholic cirrhosis of liver with ascites (H[facility])   UTI (urinary tract infection)   Hyponatremia   Cholelithiasis   Tobacco use disorder   GERD (gastroesophageal reflux disease)  Resolved Chest pain, high-sensitivity troponin negative x 2.  No evidence of acute ischemia on her EKG.  RUQ abdominal pain, persistent and  intermittent Follow RUQ abdominal US CT renal stone done on 05/19/2022 demonstrated changes of hepatic cirrhosis, cholelithiasis without evidence of acute cholecystitis, nonspecific prominent appearance of the head of the pancreas, possibly artifactual or could indicate pancreatic mass or focal pancreatitis. Follow lipase  Improving AKI, prerenal in the setting of dehydration from poor oral intake Creatinine is downtrending, 2.14 from 2.39. Creatinine on admission 3.32 with GFR of 21.  Presumptive UTI, admits to intermittent dysuria, continue IV antibiotics, empirically Rocephin. Follow urine culture for ID and sensitivities. De-escalate antibiotics as able.  Mild right  hydroureteronephrosis seen on CT scan, advised to keep follow-up appointment with urology.  The patient was receptive.   Hypovolemic hyponatremia:  Sodium is improving 128. Sodium on presentation 117 on 05/19/2022.  History of alcoholic cirrhosis with ascites Paracentesis by IR on 05/31/2021 with 6 L of cloudy yellow fluid removed.   Moderate protein calorie malnutrition Albumin 2.3 BMI 23 Encourage oral protein calorie intake Liberalize diet   Code Status: Subcu Lovenox daily  Family Communication: None at bedside  Disposition Plan: Likely will discharge to home in 24 to 48 hours.   Consultants: None.  Procedures: None.  Antimicrobials: Rocephin  DVT prophylaxis: Subcu Lovenox daily  Status is: Inpatient The patient requires at least 2 midnights for further evaluation and treatment of present condition.    Objective: Vitals:   05/20/22 2102 05/21/22 0527 05/21/22 0807 05/21/22 1304  BP: 110/73 110/76  109/84  Pulse: 90 80  82  Resp: '17 16  16  '$ Temp: 97.9 F (36.6 C) 98.3 F (36.8 C)  98.2 F (36.8 C)  TempSrc: Oral Oral  Oral  SpO2: 100% 100% 97% 95%  Weight:      Height:        Intake/Output Summary (Last 24 hours) at 05/21/2022 1513 Last data filed at 05/21/2022 0931 Gross per 24 hour  Intake 1180 ml  Output 900 ml  Net 280 ml   Filed Weights   05/19/22 1538  Weight: 77.1 kg    Exam:  General: 57 y.o. year-old male well developed well nourished in no acute distress.  Alert and oriented x3. Cardiovascular: Regular rate and rhythm with no rubs or gallops.  No thyromegaly or JVD noted.   Respiratory: Clear  to auscultation with no wheezes or rales. Good inspiratory effort. Abdomen: Soft nontender nondistended with normal bowel sounds x4 quadrants. Musculoskeletal: No lower extremity edema. 2/4 pulses in all 4 extremities. Skin: No ulcerative lesions noted or rashes, Psychiatry: Mood is appropriate for condition and setting   Data  Reviewed: CBC: Recent Labs  Lab 05/19/22 1623 05/20/22 0603 05/21/22 0613  WBC 7.3 8.0 7.6  NEUTROABS  --  5.2  --   HGB 13.8 12.0* 11.2*  HCT 40.3 35.5* 33.2*  MCV 94.6 95.2 96.5  PLT 336 259 062   Basic Metabolic Panel: Recent Labs  Lab 05/20/22 0603 05/20/22 0604 05/20/22 1033 05/20/22 1603 05/20/22 2148 05/21/22 0613  NA 123* 124* 125* 126* 125* 128*  K 5.0 5.0 5.1 4.9 4.3 4.4  CL 98 99 100 102 99 102  CO2 18* 17* 17* 16* 18* 19*  GLUCOSE 97 96 127* 124* 151* 139*  BUN 60* 58* 56* 57* 58* 52*  CREATININE 2.60* 2.57* 2.69* 2.53* 2.39* 2.14*  CALCIUM 7.7* 7.8* 7.9* 8.0* 7.8* 8.1*  MG 1.6*  --   --   --   --  1.8  PHOS  --   --   --   --   --  2.7   GFR: Estimated Creatinine Clearance: 42 mL/min (A) (by C-G formula based on SCr of 2.14 mg/dL (H)). Liver Function Tests: Recent Labs  Lab 05/19/22 1805 05/20/22 0603 05/21/22 0613  AST 43* 44* 95*  ALT 19 17 35  ALKPHOS 208* 190* 261*  BILITOT 0.9 0.6 0.6  PROT 8.1 7.0 6.8  ALBUMIN 2.6* 2.3* 2.3*   No results for input(s): "LIPASE", "AMYLASE" in the last 168 hours. No results for input(s): "AMMONIA" in the last 168 hours. Coagulation Profile: No results for input(s): "INR", "PROTIME" in the last 168 hours. Cardiac Enzymes: No results for input(s): "CKTOTAL", "CKMB", "CKMBINDEX", "TROPONINI" in the last 168 hours. BNP (last 3 results) No results for input(s): "PROBNP" in the last 8760 hours. HbA1C: No results for input(s): "HGBA1C" in the last 72 hours. CBG: No results for input(s): "GLUCAP" in the last 168 hours. Lipid Profile: No results for input(s): "CHOL", "HDL", "LDLCALC", "TRIG", "CHOLHDL", "LDLDIRECT" in the last 72 hours. Thyroid Function Tests: No results for input(s): "TSH", "T4TOTAL", "FREET4", "T3FREE", "THYROIDAB" in the last 72 hours. Anemia Panel: No results for input(s): "VITAMINB12", "FOLATE", "FERRITIN", "TIBC", "IRON", "RETICCTPCT" in the last 72 hours. Urine analysis:    Component  Value Date/Time   COLORURINE YELLOW 05/19/2022 1830   APPEARANCEUR HAZY (A) 05/19/2022 1830   LABSPEC 1.008 05/19/2022 1830   PHURINE 7.0 05/19/2022 1830   GLUCOSEU NEGATIVE 05/19/2022 1830   HGBUR SMALL (A) 05/19/2022 1830   BILIRUBINUR NEGATIVE 05/19/2022 1830   KETONESUR NEGATIVE 05/19/2022 1830   PROTEINUR 100 (A) 05/19/2022 1830   UROBILINOGEN 0.2 11/02/2013 0816   NITRITE NEGATIVE 05/19/2022 1830   LEUKOCYTESUR LARGE (A) 05/19/2022 1830   Sepsis Labs: '@LABRCNTIP'$ (procalcitonin:4,lacticidven:4)  ) Recent Results (from the past 240 hour(s))  Resp panel by RT-PCR (RSV, Flu A&B, Covid) Anterior Nasal Swab     Status: None   Collection Time: 05/19/22  6:25 PM   Specimen: Anterior Nasal Swab  Result Value Ref Range Status   SARS Coronavirus 2 by RT PCR NEGATIVE NEGATIVE Final    Comment: (NOTE) SARS-CoV-2 target nucleic acids are NOT DETECTED.  The SARS-CoV-2 RNA is generally detectable in upper respiratory specimens during the acute phase of infection. The lowest concentration of SARS-CoV-2 viral copies this  assay can detect is 138 copies/mL. A negative result does not preclude SARS-Cov-2 infection and should not be used as the sole basis for treatment or other patient management decisions. A negative result may occur with  improper specimen collection/handling, submission of specimen other than nasopharyngeal swab, presence of viral mutation(s) within the areas targeted by this assay, and inadequate number of viral copies(<138 copies/mL). A negative result must be combined with clinical observations, patient history, and epidemiological information. The expected result is Negative.  Fact Sheet for Patients:  EntrepreneurPulse.com.au  Fact Sheet for Healthcare Providers:  IncredibleEmployment.be  This test is no t yet approved or cleared by the [place] FDA and  has been authorized for detection and/or diagnosis of SARS-CoV-2  by FDA under an Emergency Use Authorization (EUA). This EUA will remain  in effect (meaning this test can be used) for the duration of the COVID-19 declaration under Section 564(b)(1) of the Act, 21 U.S.C.section 360bbb-3(b)(1), unless the authorization is terminated  or revoked sooner.       Influenza A by PCR NEGATIVE NEGATIVE Final   Influenza B by PCR NEGATIVE NEGATIVE Final    Comment: (NOTE) The Xpert Xpress SARS-CoV-2/FLU/RSV plus assay is intended as an aid in the diagnosis of influenza from Nasopharyngeal swab specimens and should not be used as a sole basis for treatment. Nasal washings and aspirates are unacceptable for Xpert Xpress SARS-CoV-2/FLU/RSV testing.  Fact Sheet for Patients: EntrepreneurPulse.com.au  Fact Sheet for Healthcare Providers: IncredibleEmployment.be  This test is not yet approved or cleared by the [place] FDA and has been authorized for detection and/or diagnosis of SARS-CoV-2 by FDA under an Emergency Use Authorization (EUA). This EUA will remain in effect (meaning this test can be used) for the duration of the COVID-19 declaration under Section 564(b)(1) of the Act, 21 U.S.C. section 360bbb-3(b)(1), unless the authorization is terminated or revoked.     Resp Syncytial Virus by PCR NEGATIVE NEGATIVE Final    Comment: (NOTE) Fact Sheet for Patients: EntrepreneurPulse.com.au  Fact Sheet for Healthcare Providers: IncredibleEmployment.be  This test is not yet approved or cleared by the [place] FDA and has been authorized for detection and/or diagnosis of SARS-CoV-2 by FDA under an Emergency Use Authorization (EUA). This EUA will remain in effect (meaning this test can be used) for the duration of the COVID-19 declaration under Section 564(b)(1) of the Act, 21 U.S.C. section 360bbb-3(b)(1), unless the authorization is terminated or revoked.  Performed at  [facility], [address] [place], [place] [address]   Urine Culture     Status: None (Preliminary result)   Collection Time: 05/19/22  6:30 PM   Specimen: Urine, Clean Catch  Result Value Ref Range Status   Specimen Description   Final    URINE, CLEAN CATCH Performed at [facility], [address] [place], [place] [address]    Special Requests   Final    NONE Performed at [facility], [address] [place], [place] [address]    Culture   Final    CULTURE REINCUBATED FOR BETTER GROWTH Performed at [facility], [place] [address], [place], [place] [address]    Report Status PENDING  Incomplete      Studies: No results found.  Scheduled Meds:  aspirin  81 mg Oral Daily   folic acid  1 mg Oral Daily   heparin  5,000 Units Subcutaneous Q8H   metoprolol tartrate  25 mg Oral BID   mometasone-formoterol  2 puff Inhalation BID  multivitamin with minerals  1 tablet Oral Daily   nicotine  14 mg Transdermal Daily   spironolactone  100 mg Oral Daily   spironolactone  50 mg Oral QPM   thiamine  100 mg Oral Daily   Or   thiamine  100 mg Intravenous Daily    Continuous Infusions:  sodium chloride 100 mL/hr at 05/21/22 0649   cefTRIAXone (ROCEPHIN)  IV Stopped (05/20/22 2142)     LOS: 1 day     Kayleen Memos, MD [facility] Pager [phone]  If 7PM-7AM, please contact night-coverage [email or web address] Password [facility] 05/21/2022, 3:13 PM

## 2022-05-22 ENCOUNTER — Inpatient Hospital Stay (HOSPITAL_COMMUNITY): Payer: Medicaid Other

## 2022-05-22 ENCOUNTER — Telehealth: Payer: Self-pay

## 2022-05-22 DIAGNOSIS — N179 Acute kidney failure, unspecified: Secondary | ICD-10-CM | POA: Diagnosis not present

## 2022-05-22 LAB — CBC
HCT: 28.1 % — ABNORMAL LOW (ref 39.0–52.0)
Hemoglobin: 9.4 g/dL — ABNORMAL LOW (ref 13.0–17.0)
MCH: 33 pg (ref 26.0–34.0)
MCHC: 33.5 g/dL (ref 30.0–36.0)
MCV: 98.6 fL (ref 80.0–100.0)
Platelets: 214 10*3/uL (ref 150–400)
RBC: 2.85 MIL/uL — ABNORMAL LOW (ref 4.22–5.81)
RDW: 14.5 % (ref 11.5–15.5)
WBC: 7.2 10*3/uL (ref 4.0–10.5)
nRBC: 0 % (ref 0.0–0.2)

## 2022-05-22 LAB — COMPREHENSIVE METABOLIC PANEL
ALT: 47 U/L — ABNORMAL HIGH (ref 0–44)
AST: 126 U/L — ABNORMAL HIGH (ref 15–41)
Albumin: 2.1 g/dL — ABNORMAL LOW (ref 3.5–5.0)
Alkaline Phosphatase: 225 U/L — ABNORMAL HIGH (ref 38–126)
Anion gap: 4 — ABNORMAL LOW (ref 5–15)
BUN: 35 mg/dL — ABNORMAL HIGH (ref 6–20)
CO2: 20 mmol/L — ABNORMAL LOW (ref 22–32)
Calcium: 7.6 mg/dL — ABNORMAL LOW (ref 8.9–10.3)
Chloride: 106 mmol/L (ref 98–111)
Creatinine, Ser: 1.96 mg/dL — ABNORMAL HIGH (ref 0.61–1.24)
GFR, Estimated: 39 mL/min — ABNORMAL LOW (ref >60)
Glucose, Bld: 116 mg/dL — ABNORMAL HIGH (ref 70–99)
Potassium: 4.4 mmol/L (ref 3.5–5.1)
Sodium: 130 mmol/L — ABNORMAL LOW (ref 135–145)
Total Bilirubin: 0.6 mg/dL (ref 0.3–1.2)
Total Protein: 6.3 g/dL — ABNORMAL LOW (ref 6.5–8.1)

## 2022-05-22 LAB — LIPASE, BLOOD: Lipase: 2369 U/L — ABNORMAL HIGH (ref 11–51)

## 2022-05-22 MED ORDER — HYDROMORPHONE HCL 1 MG/ML IJ SOLN
0.5000 mg | INTRAMUSCULAR | Status: DC | PRN
  Administered 2022-05-22 – 2022-05-24 (×6): 0.5 mg via INTRAVENOUS
  Filled 2022-05-22 (×6): qty 0.5

## 2022-05-22 MED ORDER — SENNOSIDES 8.8 MG/5ML PO SYRP
5.0000 mL | ORAL_SOLUTION | Freq: Every day | ORAL | Status: DC
  Filled 2022-05-22 (×4): qty 5

## 2022-05-22 MED ORDER — LACTATED RINGERS IV SOLN: INTRAVENOUS | Status: DC

## 2022-05-22 NOTE — Telephone Encounter (Signed)
-----  Message from Irine Seal, MD sent at 05/19/2022 10:04 PM EST ----- This fellow is going to need office f/u in the next 2-3 weeks for a history of mild right hydro, possible retention and possible posterior bladder wall lesion.   He is being admitted to [facility] this weekend.

## 2022-05-22 NOTE — Telephone Encounter (Signed)
Made patient aware that he has a history of mild right hydro, possible retention and possible posterior bladder wall lesion.  Patient is scheduled for F/U with Dr. Jeffie Pollock. Patient voiced understanding

## 2022-05-22 NOTE — Progress Notes (Signed)
PROGRESS NOTE  [name] [name] TLX:[id number] DOB: 1965-08-05 DOA: 05/19/2022 PCP: Carrolyn Meiers, MD  HPI/Recap of past 24 hours: [name] [name] is a 57 y.o. male with medical history significant of asthma, B12 deficiency, alcoholic cirrhosis, prior paracentesis with 6 L of fluid removed in 2023, former alcohol abuser, hyperlipidemia, GERD, hypertension, and nephrolithiasis, and more, who presents to [facility] with a chief complaint of back pain and chest pain.  Patient reports that the pain started 5 days ago.  Pointing to his right upper quadrant, radiating to his back.  Sharp and constant.  Quit drinking alcohol about 2 weeks ago.  Endorses a recent fall.  Unclear if he lost consciousness.  Broken ribs on imaging.  Patient currently smokes half a pack per day.  Counseled on importance of tobacco cessation.    Hospital course complicated by persistent right upper quadrant pain and epigastric pain.  Nausea and vomiting on 05/22/2022.  Initial lipase 182, repeat 2369.  Patient has been noncompliant with medical management, states he has to eat no matter what.  Made NPO for now.   05/22/2022: The patient reports severe right upper quadrant epigastric pain with palpation.  IV opiate-based analgesics ordered.  New diagnosis of acute pancreatitis.  Assessment/Plan: Principal Problem:   AKI (acute kidney injury) ([place]) Active Problems:   Alcoholic cirrhosis of liver with ascites (HCC)   UTI (urinary tract infection)   Hyponatremia   Cholelithiasis   Tobacco use disorder   GERD (gastroesophageal reflux disease)  Resolved Chest pain, high-sensitivity troponin negative x 2.  No evidence of acute ischemia on her EKG.  Acute pancreatitis Severe epigastric pain, lipase 182, 2369. Nausea and vomiting N.p.o. for now due to worsening symptoms with a regular diet.  Patient has declined a clear liquid diet. IV antiemetics as needed IV analgesics as needed IV fluid hydration LR at 175 cc/h x 2  days Closely monitor volume status while on IV fluid with history of cirrhosis.  RUQ abdominal pain, persistent and  intermittent Follow RUQ abdominal US CT renal stone done on 05/19/2022 demonstrated changes of hepatic cirrhosis, cholelithiasis without evidence of acute cholecystitis, nonspecific prominent appearance of the head of the pancreas, possibly artifactual or could indicate pancreatic mass or focal pancreatitis.  Mild gallbladder wall thickening on the right upper quadrant abdominal ultrasound, per radiology report which could be from the patient's hypoproteinemia/hypoalbuminemia or inflammation.  Improving AKI, prerenal in the setting of dehydration from poor oral intake Creatinine downtrending 1.96 from peak of 3.32. Continue to avoid nephrotoxic agents.  Presumptive UTI, admits to intermittent dysuria, continue IV antibiotics, empirically Rocephin. Urine culture reincubated for better growth Follow urine culture for ID and sensitivities. De-escalate antibiotics as able.  Mild right hydroureteronephrosis seen on CT scan, advised to keep follow-up appointment with urology.  The patient was receptive.   Resolving hypovolemic hyponatremia:  Sodium is improving 128>> 130. Sodium on presentation 117 on 05/19/2022.  History of alcoholic cirrhosis with ascites Paracentesis by IR on 05/31/2021 with 6 L of cloudy yellow fluid removed.   Moderate protein calorie malnutrition Albumin 2.3 BMI 23       Code Status: Subcu Lovenox daily  Family Communication: None at bedside  Disposition Plan: Likely will discharge to home in 48 to 72 hours when can tolerate a diet.  New diagnosis of acute pancreatitis.   Consultants: None.  Procedures: None.  Antimicrobials: Rocephin  DVT prophylaxis: Subcu Lovenox daily  Status is: Inpatient The patient requires at least 2 midnights  for further evaluation and treatment of present condition.    Objective: Vitals:   05/21/22  2038 05/22/22 0339 05/22/22 0719 05/22/22 1128  BP:  111/76  109/72  Pulse:  86  82  Resp:  15  18  Temp:  98.7 F (37.1 C)  97.9 F (36.6 C)  TempSrc:  Oral    SpO2: 98% 100% 99% 100%  Weight:      Height:        Intake/Output Summary (Last 24 hours) at 05/22/2022 1657 Last data filed at 05/22/2022 1300 Gross per 24 hour  Intake 2812.69 ml  Output --  Net 2812.69 ml   Filed Weights   05/19/22 1538  Weight: 77.1 kg    Exam:  General: 57 y.o. year-old male frail-appearing in no acute distress.  He is alert oriented x 3. Cardiovascular: Regular rate and rhythm no rubs or gallops. Respiratory: Clear to auscultation no wheezes or rales. Abdomen: Severely tender in epigastric region.   Musculoskeletal: No lower extremity edema bilaterally.   Skin: No ulcerative lesions noted or rashes, Psychiatry: Mood is appropriate for condition and setting.   Data Reviewed: CBC: Recent Labs  Lab 05/19/22 1623 05/20/22 0603 05/21/22 0613 05/22/22 0547  WBC 7.3 8.0 7.6 7.2  NEUTROABS  --  5.2  --   --   HGB 13.8 12.0* 11.2* 9.4*  HCT 40.3 35.5* 33.2* 28.1*  MCV 94.6 95.2 96.5 98.6  PLT 336 259 241 916   Basic Metabolic Panel: Recent Labs  Lab 05/20/22 0603 05/20/22 0604 05/20/22 1033 05/20/22 1603 05/20/22 2148 05/21/22 0613 05/22/22 0547  NA 123*   < > 125* 126* 125* 128* 130*  K 5.0   < > 5.1 4.9 4.3 4.4 4.4  CL 98   < > 100 102 99 102 106  CO2 18*   < > 17* 16* 18* 19* 20*  GLUCOSE 97   < > 127* 124* 151* 139* 116*  BUN 60*   < > 56* 57* 58* 52* 35*  CREATININE 2.60*   < > 2.69* 2.53* 2.39* 2.14* 1.96*  CALCIUM 7.7*   < > 7.9* 8.0* 7.8* 8.1* 7.6*  MG 1.6*  --   --   --   --  1.8  --   PHOS  --   --   --   --   --  2.7  --    < > = values in this interval not displayed.   GFR: Estimated Creatinine Clearance: 45.9 mL/min (A) (by C-G formula based on SCr of 1.96 mg/dL (H)). Liver Function Tests: Recent Labs  Lab 05/19/22 1805 05/20/22 0603 05/21/22 0613  05/22/22 0547  AST 43* 44* 95* 126*  ALT 19 17 35 47*  ALKPHOS 208* 190* 261* 225*  BILITOT 0.9 0.6 0.6 0.6  PROT 8.1 7.0 6.8 6.3*  ALBUMIN 2.6* 2.3* 2.3* 2.1*   Recent Labs  Lab 05/21/22 1519 05/22/22 0943  LIPASE 182* 2,369*   No results for input(s): "AMMONIA" in the last 168 hours. Coagulation Profile: No results for input(s): "INR", "PROTIME" in the last 168 hours. Cardiac Enzymes: No results for input(s): "CKTOTAL", "CKMB", "CKMBINDEX", "TROPONINI" in the last 168 hours. BNP (last 3 results) No results for input(s): "PROBNP" in the last 8760 hours. HbA1C: No results for input(s): "HGBA1C" in the last 72 hours. CBG: No results for input(s): "GLUCAP" in the last 168 hours. Lipid Profile: No results for input(s): "CHOL", "HDL", "LDLCALC", "TRIG", "CHOLHDL", "LDLDIRECT" in the last 72 hours. Thyroid  Function Tests: No results for input(s): "TSH", "T4TOTAL", "FREET4", "T3FREE", "THYROIDAB" in the last 72 hours. Anemia Panel: No results for input(s): "VITAMINB12", "FOLATE", "FERRITIN", "TIBC", "IRON", "RETICCTPCT" in the last 72 hours. Urine analysis:    Component Value Date/Time   COLORURINE YELLOW 05/19/2022 1830   APPEARANCEUR HAZY (A) 05/19/2022 1830   LABSPEC 1.008 05/19/2022 1830   PHURINE 7.0 05/19/2022 1830   GLUCOSEU NEGATIVE 05/19/2022 1830   HGBUR SMALL (A) 05/19/2022 1830   BILIRUBINUR NEGATIVE 05/19/2022 1830   KETONESUR NEGATIVE 05/19/2022 1830   PROTEINUR 100 (A) 05/19/2022 1830   UROBILINOGEN 0.2 11/02/2013 0816   NITRITE NEGATIVE 05/19/2022 1830   LEUKOCYTESUR LARGE (A) 05/19/2022 1830   Sepsis Labs: '@LABRCNTIP'$ (procalcitonin:4,lacticidven:4)  ) Recent Results (from the past 240 hour(s))  Resp panel by RT-PCR (RSV, Flu A&B, Covid) Anterior Nasal Swab     Status: None   Collection Time: 05/19/22  6:25 PM   Specimen: Anterior Nasal Swab  Result Value Ref Range Status   SARS Coronavirus 2 by RT PCR NEGATIVE NEGATIVE Final    Comment:  (NOTE) SARS-CoV-2 target nucleic acids are NOT DETECTED.  The SARS-CoV-2 RNA is generally detectable in upper respiratory specimens during the acute phase of infection. The lowest concentration of SARS-CoV-2 viral copies this assay can detect is 138 copies/mL. A negative result does not preclude SARS-Cov-2 infection and should not be used as the sole basis for treatment or other patient management decisions. A negative result may occur with  improper specimen collection/handling, submission of specimen other than nasopharyngeal swab, presence of viral mutation(s) within the areas targeted by this assay, and inadequate number of viral copies(<138 copies/mL). A negative result must be combined with clinical observations, patient history, and epidemiological information. The expected result is Negative.  Fact Sheet for Patients:  EntrepreneurPulse.com.au  Fact Sheet for Healthcare Providers:  IncredibleEmployment.be  This test is no t yet approved or cleared by the [place] FDA and  has been authorized for detection and/or diagnosis of SARS-CoV-2 by FDA under an Emergency Use Authorization (EUA). This EUA will remain  in effect (meaning this test can be used) for the duration of the COVID-19 declaration under Section 564(b)(1) of the Act, 21 U.S.C.section 360bbb-3(b)(1), unless the authorization is terminated  or revoked sooner.       Influenza A by PCR NEGATIVE NEGATIVE Final   Influenza B by PCR NEGATIVE NEGATIVE Final    Comment: (NOTE) The Xpert Xpress SARS-CoV-2/FLU/RSV plus assay is intended as an aid in the diagnosis of influenza from Nasopharyngeal swab specimens and should not be used as a sole basis for treatment. Nasal washings and aspirates are unacceptable for Xpert Xpress SARS-CoV-2/FLU/RSV testing.  Fact Sheet for Patients: EntrepreneurPulse.com.au  Fact Sheet for Healthcare  Providers: IncredibleEmployment.be  This test is not yet approved or cleared by the [place] FDA and has been authorized for detection and/or diagnosis of SARS-CoV-2 by FDA under an Emergency Use Authorization (EUA). This EUA will remain in effect (meaning this test can be used) for the duration of the COVID-19 declaration under Section 564(b)(1) of the Act, 21 U.S.C. section 360bbb-3(b)(1), unless the authorization is terminated or revoked.     Resp Syncytial Virus by PCR NEGATIVE NEGATIVE Final    Comment: (NOTE) Fact Sheet for Patients: EntrepreneurPulse.com.au  Fact Sheet for Healthcare Providers: IncredibleEmployment.be  This test is not yet approved or cleared by the [place] FDA and has been authorized for detection and/or diagnosis of SARS-CoV-2 by FDA under an Emergency Use Authorization (  EUA). This EUA will remain in effect (meaning this test can be used) for the duration of the COVID-19 declaration under Section 564(b)(1) of the Act, 21 U.S.C. section 360bbb-3(b)(1), unless the authorization is terminated or revoked.  Performed at [facility], [address], [place], [place] [address]   Urine Culture     Status: None (Preliminary result)   Collection Time: 05/19/22  6:30 PM   Specimen: Urine, Clean Catch  Result Value Ref Range Status   Specimen Description   Final    URINE, CLEAN CATCH Performed at [facility], [address], [place], [place] [address]    Special Requests   Final    NONE Performed at [facility], [address], [place], [place] [address]    Culture   Final    CULTURE REINCUBATED FOR BETTER GROWTH Performed at [facility], [place] [address] [place], [place] [address]    Report Status PENDING  Incomplete      Studies: US Abdomen Limited RUQ (LIVER/GB)  Result Date: 05/22/2022 CLINICAL DATA:  Right upper quadrant abdominal pain EXAM: ULTRASOUND ABDOMEN LIMITED RIGHT  UPPER QUADRANT COMPARISON:  CT scan 05/19/2022 FINDINGS: Gallbladder: Poorly shadowing gallstones are present in the gallbladder and appear mobile, largest 1.5 cm. Sonographic Murphy's sign absent. Mild gallbladder wall thickening at 0.3 cm. Trace perihepatic ascites including in the gallbladder fossa. Common bile duct: Diameter: 0.5 cm, within normal limits Liver: Nodular contour with mild nonfocal internal heterogeneity and mildly accentuated echogenicity. Portal vein is patent on color Doppler imaging with normal direction of blood flow towards the liver. Other: None. IMPRESSION: 1. Cholelithiasis with mild gallbladder wall thickening, which could be from the patient's hypoproteinemia/hypoalbuminemia or inflammation. Sonographic Murphy's sign absent. Trace perihepatic ascites some of which is adjacent to the gallbladder. Correlate clinically in assessing for cholecystitis. 2. Nodular contour of the liver with mild nonfocal internal heterogeneity and mildly accentuated echogenicity. Findings are nonspecific but could reflect underlying cirrhosis. 3. Trace perihepatic ascites. Electronically Signed   By: Van Clines M.D.   On: 05/22/2022 08:56    Scheduled Meds:  aspirin  81 mg Oral Daily   enoxaparin (LOVENOX) injection  40 mg Subcutaneous X72I   folic acid  1 mg Oral Daily   metoprolol tartrate  25 mg Oral BID   mometasone-formoterol  2 puff Inhalation BID   multivitamin with minerals  1 tablet Oral Daily   nicotine  14 mg Transdermal Daily   sennosides  5 mL Oral QHS   spironolactone  100 mg Oral Daily   spironolactone  50 mg Oral QPM   thiamine  100 mg Oral Daily   Or   thiamine  100 mg Intravenous Daily    Continuous Infusions:  sodium chloride 100 mL/hr at 05/22/22 1406   cefTRIAXone (ROCEPHIN)  IV Stopped (05/21/22 2109)     LOS: 2 days     Kayleen Memos, MD [facility] Pager [phone]  If 7PM-7AM, please contact night-coverage [email or web address] Password  [facility] 05/22/2022, 4:57 PM

## 2022-05-22 NOTE — Progress Notes (Signed)
During assessment c/o mid chest pain that radiates to back and feet numb and tingling. Hands have slight tremor.

## 2022-05-22 NOTE — TOC Initial Note (Signed)
Transition of Care [facility]) - Initial/Assessment Note    Patient Details  Name: [name] [name] MRN: [id number] Date of Birth: 10/31/1965  Transition of [facility]) CM/SW Contact:    Salome Arnt, [place] Phone Number: 05/22/2022, 9:16 AM  Clinical Narrative:   Pt admitted for acute kidney injury. Assessment completed due to high risk readmission score and consult for substance abuse. Pt reports he lives with his girlfriend. He is independent with ADLs. Pt requesting cane with no preference on agency. Referred and accepted by Erasmo Downer with [employer]. He has transportation to appointments. Pt plans to return home when medically stable.  Discussed substance use with pt. He reports he stopped drinking on his own about 2 weeks ago. Pt declines outpatient substance abuse counseling resources as he feels they are not needed. TOC will continue to follow.                 Expected Discharge Plan: Home/Self Care Barriers to Discharge: Continued Medical Work up   Patient Goals and CMS Choice Patient states their goals for this hospitalization and ongoing recovery are:: return home     [place] ownership interest in [facility].provided to::  (n/a)    Expected Discharge Plan and Services In-house Referral: Clinical Social Work     Living arrangements for the past 2 months: Single Family Home                 DME Arranged: Kasandra Knudsen DME Agency: [employer] Date DME Agency Contacted: 05/22/22 Time DME Agency Contacted: [phone] Representative spoke with at DME Agency: Erasmo Downer            Prior Living Arrangements/Services Living arrangements for the past 2 months: [place] Lives with:: Significant Other Patient language and need for interpreter reviewed:: Yes Do you feel safe going back to the place where you live?: Yes      Need for Family Participation in Patient Care: No (Comment)     Criminal Activity/Legal Involvement Pertinent to Current Situation/Hospitalization: No -  Comment as needed  Activities of Daily Living Home Assistive Devices/Equipment: Cane (specify quad or straight) ADL Screening (condition at time of admission) Patient's cognitive ability adequate to safely complete daily activities?: Yes Is the patient deaf or have difficulty hearing?: No Does the patient have difficulty seeing, even when wearing glasses/contacts?: No Does the patient have difficulty concentrating, remembering, or making decisions?: No Patient able to express need for assistance with ADLs?: Yes Does the patient have difficulty dressing or bathing?: No Independently performs ADLs?: Yes (appropriate for developmental age) Does the patient have difficulty walking or climbing stairs?: No Weakness of Legs: Both Weakness of Arms/Hands: None  Permission Sought/Granted                  Emotional Assessment     Affect (typically observed): Appropriate Orientation: : Oriented to Self, Oriented to Place, Oriented to  Time, Oriented to Situation   Psych Involvement: No (comment)  Admission diagnosis:  AKI (acute kidney injury) ([place]) [N17.9] Patient Active Problem List   Diagnosis Date Noted   Cholelithiasis 05/20/2022   Tobacco use disorder 05/20/2022   GERD (gastroesophageal reflux disease) 05/20/2022   B12 deficiency 03/15/2022   History of colonic polyps 03/09/2022   Upper abdominal pain 08/11/2021   AKI (acute kidney injury) ([place]) 08/11/2021   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   Elevated ferritin 08/26/2020   Hypomagnesemia 08/11/2020   Hypoalbuminemia 08/11/2020   Hypotension 08/11/2020   Anemia  of chronic disease 08/11/2020   DVT (deep venous thrombosis) -Lt LL Extensive DVT 30/16/0109   Alcoholic cirrhosis of liver with ascites ([place]) 08/05/2020   COPD (chronic obstructive pulmonary disease) ([place]) 08/05/2020   Hypokalemia 08/05/2020   Cirrhosis of liver without ascites ([place]) 08/04/2020   Edema of left lower extremity 08/04/2020    Loss of weight 07/07/2020   Loss of appetite 07/07/2020   Anasarca 07/07/2020   Heme + stool 01/22/2017   Abnormal LFTs 01/22/2017   Hyperlipidemia 02/08/2015   Cigarette nicotine dependence with nicotine-induced disorder 02/08/2015   Leukocytosis, unspecified 01/07/2013   Hyponatremia 01/07/2013   UTI (urinary tract infection) 01/06/2013   Kidney stone 32/35/5732   Ureteral colic 20/25/4270   PCP:  Carrolyn Meiers, MD Pharmacy:   [facility], [address] [place] [address] Phone: [phone] Fax: [phone]     Social Determinants of Health (SDOH) Social History: SDOH Screenings   Food Insecurity: No Food Insecurity (05/20/2022)  Housing: Low Risk  (05/20/2022)  Transportation Needs: No Transportation Needs (05/20/2022)  Utilities: Not At Risk (05/20/2022)  Alcohol Screen: Low Risk  (09/13/2020)  Financial Resource Strain: High Risk (09/13/2020)  Physical Activity: Sufficiently Active (09/13/2020)  Social Connections: Moderately Isolated (09/13/2020)  Stress: No Stress Concern Present (09/13/2020)  Tobacco Use: High Risk (05/19/2022)   SDOH Interventions:     Readmission Risk Interventions    05/22/2022    9:08 AM  Readmission Risk Prevention Plan  Transportation Screening Complete  HRI or Home Care Consult Complete  Social Work Consult for [place] Planning/Counseling Complete  Palliative Care Screening Not Applicable  Medication Review Press photographer) Complete

## 2022-05-23 ENCOUNTER — Inpatient Hospital Stay (HOSPITAL_COMMUNITY): Payer: Medicaid Other

## 2022-05-23 DIAGNOSIS — K852 Alcohol induced acute pancreatitis without necrosis or infection: Secondary | ICD-10-CM | POA: Diagnosis not present

## 2022-05-23 DIAGNOSIS — N179 Acute kidney failure, unspecified: Secondary | ICD-10-CM | POA: Diagnosis not present

## 2022-05-23 DIAGNOSIS — K7031 Alcoholic cirrhosis of liver with ascites: Secondary | ICD-10-CM | POA: Diagnosis not present

## 2022-05-23 LAB — URINE CULTURE: Culture: >=100000 — AB

## 2022-05-23 LAB — COMPREHENSIVE METABOLIC PANEL
ALT: 37 U/L (ref 0–44)
AST: 70 U/L — ABNORMAL HIGH (ref 15–41)
Albumin: 1.9 g/dL — ABNORMAL LOW (ref 3.5–5.0)
Alkaline Phosphatase: 183 U/L — ABNORMAL HIGH (ref 38–126)
Anion gap: 4 — ABNORMAL LOW (ref 5–15)
BUN: 24 mg/dL — ABNORMAL HIGH (ref 6–20)
CO2: 19 mmol/L — ABNORMAL LOW (ref 22–32)
Calcium: 7.8 mg/dL — ABNORMAL LOW (ref 8.9–10.3)
Chloride: 110 mmol/L (ref 98–111)
Creatinine, Ser: 1.55 mg/dL — ABNORMAL HIGH (ref 0.61–1.24)
GFR, Estimated: 52 mL/min — ABNORMAL LOW (ref >60)
Glucose, Bld: 88 mg/dL (ref 70–99)
Potassium: 4.5 mmol/L (ref 3.5–5.1)
Sodium: 133 mmol/L — ABNORMAL LOW (ref 135–145)
Total Bilirubin: 0.3 mg/dL (ref 0.3–1.2)
Total Protein: 5.9 g/dL — ABNORMAL LOW (ref 6.5–8.1)

## 2022-05-23 LAB — CBC
HCT: 28.3 % — ABNORMAL LOW (ref 39.0–52.0)
Hemoglobin: 9.1 g/dL — ABNORMAL LOW (ref 13.0–17.0)
MCH: 32.3 pg (ref 26.0–34.0)
MCHC: 32.2 g/dL (ref 30.0–36.0)
MCV: 100.4 fL — ABNORMAL HIGH (ref 80.0–100.0)
Platelets: 205 10*3/uL (ref 150–400)
RBC: 2.82 MIL/uL — ABNORMAL LOW (ref 4.22–5.81)
RDW: 14.5 % (ref 11.5–15.5)
WBC: 9 10*3/uL (ref 4.0–10.5)
nRBC: 0 % (ref 0.0–0.2)

## 2022-05-23 MED ORDER — GADOBUTROL 1 MMOL/ML IV SOLN
8.0000 mL | Freq: Once | INTRAVENOUS | Status: AC | PRN
  Administered 2022-05-23: 8 mL via INTRAVENOUS

## 2022-05-23 NOTE — Progress Notes (Signed)
PROGRESS NOTE    [name] [name]  FYB:[id number] DOB: 1965-10-18 DOA: 05/19/2022 PCP: Carrolyn Meiers, MD   Brief Narrative:    [name] [name] is a 57 y.o. male with medical history significant of asthma, B12 deficiency, alcoholic cirrhosis, prior paracentesis with 6 L of fluid removed in 2023, former alcohol abuser, hyperlipidemia, GERD, hypertension, and nephrolithiasis, and more, who presents to [facility] with a chief complaint of back pain and chest pain.  Patient reports that the pain started 5 days ago.  Pointing to his right upper quadrant, radiating to his back.  Sharp and constant.  Quit drinking alcohol about 2 weeks ago.  Endorses a recent fall.  Unclear if he lost consciousness.  Broken ribs on imaging.   Patient currently smokes half a pack per day.  Counseled on importance of tobacco cessation.     Hospital course complicated by persistent right upper quadrant pain and epigastric pain.  Nausea and vomiting on 05/22/2022.  Initial lipase 182, repeat 2369.  Patient has been noncompliant with medical management, states he has to eat no matter what.  Made NPO for now.  Assessment & Plan:   Principal Problem:   AKI (acute kidney injury) ([place]) Active Problems:   Alcoholic cirrhosis of liver with ascites ([facility])   UTI (urinary tract infection)   Hyponatremia   Cholelithiasis   Tobacco use disorder   GERD (gastroesophageal reflux disease)  Assessment and Plan:  Resolved Chest pain, high-sensitivity troponin negative x 2.  No evidence of acute ischemia on her EKG.   Acute pancreatitis No further nausea or vomiting noted Trial of clear liquid diet IV antiemetics as needed IV analgesics as needed IV fluid hydration LR at 75 cc/h x 2 days Closely monitor volume status while on IV fluid with history of cirrhosis. Recheck lipase and LFTs in a.m. Appreciate GI evaluation   RUQ abdominal pain, persistent and  intermittent Follow RUQ abdominal US CT renal stone done on 05/19/2022  demonstrated changes of hepatic cirrhosis, cholelithiasis without evidence of acute cholecystitis, nonspecific prominent appearance of the head of the pancreas, possibly artifactual or could indicate pancreatic mass or focal pancreatitis.  Mild gallbladder wall thickening on the right upper quadrant abdominal ultrasound, per radiology report which could be from the patient's hypoproteinemia/hypoalbuminemia or inflammation. General surgery evaluation   Improving AKI, prerenal in the setting of dehydration from poor oral intake Creatinine downtrending 1.55 from peak of 3.32. Continue to avoid nephrotoxic agents.   Presumptive UTI, admits to intermittent dysuria, continue IV antibiotics, empirically Rocephin. Urine culture positive for Aerococcus Follow urine culture for send activities Stop Rocephin today   Mild right hydroureteronephrosis seen on CT scan, advised to keep follow-up appointment with urology.  The patient was receptive.   Resolving hypovolemic hyponatremia:  Sodium is improving 130>> 133. Sodium on presentation 117 on 05/19/2022.   History of alcoholic cirrhosis with ascites Paracentesis by IR on 05/31/2021 with 6 L of cloudy yellow fluid removed.   Moderate protein calorie malnutrition Albumin 2.3 BMI 23      Code Status: Subcu Lovenox daily   Family Communication: None at bedside   Disposition Plan: Likely will discharge to home in 48 to 72 hours when can tolerate a diet.  New diagnosis of acute pancreatitis.     Consultants: GI General surgery   Procedures: None.   Antimicrobials: Anti-infectives (From admission, onward)    Start     Dose/Rate Route Frequency Ordered Stop   05/20/22 2000  cefTRIAXone (ROCEPHIN) 1  g in sodium chloride 0.9 % 100 mL IVPB        1 g 200 mL/hr over 30 Minutes Intravenous Every 24 hours 05/20/22 0630     05/19/22 2115  cefTRIAXone (ROCEPHIN) 1 g in sodium chloride 0.9 % 100 mL IVPB        1 g 200 mL/hr over 30 Minutes  Intravenous  Once 05/19/22 2108 05/19/22 2235         DVT prophylaxis: Subcu Lovenox daily   Status is: Inpatient The patient requires at least 2 midnights for further evaluation and treatment of present condition.   Subjective: Patient seen and evaluated today with improvement in abdominal pain with no further nausea or vomiting.  Would like to try to have something to eat.  No acute overnight events noted.  Objective: Vitals:   05/22/22 2003 05/22/22 2133 05/23/22 0601 05/23/22 0737  BP:  123/80 113/82   Pulse:  86 79   Resp:  18 19   Temp:  98.5 F (36.9 C) 98.6 F (37 C)   TempSrc:  Oral Oral   SpO2: 99% 100% 99% 99%  Weight:      Height:        Intake/Output Summary (Last 24 hours) at 05/23/2022 1331 Last data filed at 05/23/2022 0900 Gross per 24 hour  Intake 1766.54 ml  Output 450 ml  Net 1316.54 ml   Filed Weights   05/19/22 1538  Weight: 77.1 kg    Examination:  General exam: Appears calm and comfortable  Respiratory system: Clear to auscultation. Respiratory effort normal. Cardiovascular system: S1 & S2 heard, RRR.  Gastrointestinal system: Abdomen is soft Central nervous system: Alert and awake Extremities: No edema Skin: No significant lesions noted Psychiatry: Flat affect.    Data Reviewed: I have personally reviewed following labs and imaging studies  CBC: Recent Labs  Lab 05/19/22 1623 05/20/22 0603 05/21/22 0613 05/22/22 0547 05/23/22 0439  WBC 7.3 8.0 7.6 7.2 9.0  NEUTROABS  --  5.2  --   --   --   HGB 13.8 12.0* 11.2* 9.4* 9.1*  HCT 40.3 35.5* 33.2* 28.1* 28.3*  MCV 94.6 95.2 96.5 98.6 100.4*  PLT 336 259 241 214 081   Basic Metabolic Panel: Recent Labs  Lab 05/20/22 0603 05/20/22 0604 05/20/22 1603 05/20/22 2148 05/21/22 0613 05/22/22 0547 05/23/22 0439  NA 123*   < > 126* 125* 128* 130* 133*  K 5.0   < > 4.9 4.3 4.4 4.4 4.5  CL 98   < > 102 99 102 106 110  CO2 18*   < > 16* 18* 19* 20* 19*  GLUCOSE 97   < > 124*  151* 139* 116* 88  BUN 60*   < > 57* 58* 52* 35* 24*  CREATININE 2.60*   < > 2.53* 2.39* 2.14* 1.96* 1.55*  CALCIUM 7.7*   < > 8.0* 7.8* 8.1* 7.6* 7.8*  MG 1.6*  --   --   --  1.8  --   --   PHOS  --   --   --   --  2.7  --   --    < > = values in this interval not displayed.   GFR: Estimated Creatinine Clearance: 58 mL/min (A) (by C-G formula based on SCr of 1.55 mg/dL (H)). Liver Function Tests: Recent Labs  Lab 05/19/22 1805 05/20/22 0603 05/21/22 0613 05/22/22 0547 05/23/22 0439  AST 43* 44* 95* 126* 70*  ALT 19 17 35 47*  54  ALKPHOS 208* 190* 261* 225* 183*  BILITOT 0.9 0.6 0.6 0.6 0.3  PROT 8.1 7.0 6.8 6.3* 5.9*  ALBUMIN 2.6* 2.3* 2.3* 2.1* 1.9*   Recent Labs  Lab 05/21/22 1519 05/22/22 0943  LIPASE 182* 2,369*   No results for input(s): "AMMONIA" in the last 168 hours. Coagulation Profile: No results for input(s): "INR", "PROTIME" in the last 168 hours. Cardiac Enzymes: No results for input(s): "CKTOTAL", "CKMB", "CKMBINDEX", "TROPONINI" in the last 168 hours. BNP (last 3 results) No results for input(s): "PROBNP" in the last 8760 hours. HbA1C: No results for input(s): "HGBA1C" in the last 72 hours. CBG: No results for input(s): "GLUCAP" in the last 168 hours. Lipid Profile: No results for input(s): "CHOL", "HDL", "LDLCALC", "TRIG", "CHOLHDL", "LDLDIRECT" in the last 72 hours. Thyroid Function Tests: No results for input(s): "TSH", "T4TOTAL", "FREET4", "T3FREE", "THYROIDAB" in the last 72 hours. Anemia Panel: No results for input(s): "VITAMINB12", "FOLATE", "FERRITIN", "TIBC", "IRON", "RETICCTPCT" in the last 72 hours. Sepsis Labs: No results for input(s): "PROCALCITON", "LATICACIDVEN" in the last 168 hours.  Recent Results (from the past 240 hour(s))  Resp panel by RT-PCR (RSV, Flu A&B, Covid) Anterior Nasal Swab     Status: None   Collection Time: 05/19/22  6:25 PM   Specimen: Anterior Nasal Swab  Result Value Ref Range Status   SARS Coronavirus 2 by  RT PCR NEGATIVE NEGATIVE Final    Comment: (NOTE) SARS-CoV-2 target nucleic acids are NOT DETECTED.  The SARS-CoV-2 RNA is generally detectable in upper respiratory specimens during the acute phase of infection. The lowest concentration of SARS-CoV-2 viral copies this assay can detect is 138 copies/mL. A negative result does not preclude SARS-Cov-2 infection and should not be used as the sole basis for treatment or other patient management decisions. A negative result may occur with  improper specimen collection/handling, submission of specimen other than nasopharyngeal swab, presence of viral mutation(s) within the areas targeted by this assay, and inadequate number of viral copies(<138 copies/mL). A negative result must be combined with clinical observations, patient history, and epidemiological information. The expected result is Negative.  Fact Sheet for Patients:  EntrepreneurPulse.com.au  Fact Sheet for Healthcare Providers:  IncredibleEmployment.be  This test is no t yet approved or cleared by the [place] FDA and  has been authorized for detection and/or diagnosis of SARS-CoV-2 by FDA under an Emergency Use Authorization (EUA). This EUA will remain  in effect (meaning this test can be used) for the duration of the COVID-19 declaration under Section 564(b)(1) of the Act, 21 U.S.C.section 360bbb-3(b)(1), unless the authorization is terminated  or revoked sooner.       Influenza A by PCR NEGATIVE NEGATIVE Final   Influenza B by PCR NEGATIVE NEGATIVE Final    Comment: (NOTE) The Xpert Xpress SARS-CoV-2/FLU/RSV plus assay is intended as an aid in the diagnosis of influenza from Nasopharyngeal swab specimens and should not be used as a sole basis for treatment. Nasal washings and aspirates are unacceptable for Xpert Xpress SARS-CoV-2/FLU/RSV testing.  Fact Sheet for Patients: EntrepreneurPulse.com.au  Fact Sheet  for Healthcare Providers: IncredibleEmployment.be  This test is not yet approved or cleared by the [place] FDA and has been authorized for detection and/or diagnosis of SARS-CoV-2 by FDA under an Emergency Use Authorization (EUA). This EUA will remain in effect (meaning this test can be used) for the duration of the COVID-19 declaration under Section 564(b)(1) of the Act, 21 U.S.C. section 360bbb-3(b)(1), unless the authorization is terminated or revoked.  Resp Syncytial Virus by PCR NEGATIVE NEGATIVE Final    Comment: (NOTE) Fact Sheet for Patients: EntrepreneurPulse.com.au  Fact Sheet for Healthcare Providers: IncredibleEmployment.be  This test is not yet approved or cleared by the [place] FDA and has been authorized for detection and/or diagnosis of SARS-CoV-2 by FDA under an Emergency Use Authorization (EUA). This EUA will remain in effect (meaning this test can be used) for the duration of the COVID-19 declaration under Section 564(b)(1) of the Act, 21 U.S.C. section 360bbb-3(b)(1), unless the authorization is terminated or revoked.  Performed at [facility], [address] [place], [place] [address]   Urine Culture     Status: Abnormal   Collection Time: 05/19/22  6:30 PM   Specimen: Urine, Clean Catch  Result Value Ref Range Status   Specimen Description   Final    URINE, CLEAN CATCH Performed at [facility], [address] [place], [place] [address]    Special Requests   Final    NONE Performed at [facility], [address], [place], [place] [address]    Culture (A)  Final    >=100,000 COLONIES/mL AEROCOCCUS URINAE Standardized susceptibility testing for this organism is not available. Performed at [facility], [place] [address] [place], [place] [address]    Report Status 05/23/2022 FINAL  Final         Radiology Studies: US Abdomen Limited RUQ (LIVER/GB)  Result Date:  05/22/2022 CLINICAL DATA:  Right upper quadrant abdominal pain EXAM: ULTRASOUND ABDOMEN LIMITED RIGHT UPPER QUADRANT COMPARISON:  CT scan 05/19/2022 FINDINGS: Gallbladder: Poorly shadowing gallstones are present in the gallbladder and appear mobile, largest 1.5 cm. Sonographic Murphy's sign absent. Mild gallbladder wall thickening at 0.3 cm. Trace perihepatic ascites including in the gallbladder fossa. Common bile duct: Diameter: 0.5 cm, within normal limits Liver: Nodular contour with mild nonfocal internal heterogeneity and mildly accentuated echogenicity. Portal vein is patent on color Doppler imaging with normal direction of blood flow towards the liver. Other: None. IMPRESSION: 1. Cholelithiasis with mild gallbladder wall thickening, which could be from the patient's hypoproteinemia/hypoalbuminemia or inflammation. Sonographic Murphy's sign absent. Trace perihepatic ascites some of which is adjacent to the gallbladder. Correlate clinically in assessing for cholecystitis. 2. Nodular contour of the liver with mild nonfocal internal heterogeneity and mildly accentuated echogenicity. Findings are nonspecific but could reflect underlying cirrhosis. 3. Trace perihepatic ascites. Electronically Signed   By: Van Clines M.D.   On: 05/22/2022 08:56        Scheduled Meds:  aspirin  81 mg Oral Daily   enoxaparin (LOVENOX) injection  40 mg Subcutaneous D82M   folic acid  1 mg Oral Daily   metoprolol tartrate  25 mg Oral BID   mometasone-formoterol  2 puff Inhalation BID   multivitamin with minerals  1 tablet Oral Daily   nicotine  14 mg Transdermal Daily   sennosides  5 mL Oral QHS   spironolactone  100 mg Oral Daily   spironolactone  50 mg Oral QPM   thiamine  100 mg Oral Daily   Or   thiamine  100 mg Intravenous Daily   Continuous Infusions:  cefTRIAXone (ROCEPHIN)  IV 1 g (05/22/22 2209)   lactated ringers 125 mL/hr at 05/23/22 0920     LOS: 3 days    Time spent: 35  minutes    Gerik Coberly D Manuella Ghazi, DO [facility]  If 7PM-7AM, please contact night-coverage [email or web address] 05/23/2022, 1:31 PM

## 2022-05-23 NOTE — Progress Notes (Signed)
[facility]  Reviewed imaging and labs.  Would get MRCP just to look at pancreas given question on CT scan and this will also give [place] a better look at the gallbladder.  Will see Patient tomorrow.  Discussed with Dr. Manuella Ghazi.  Curlene Labrum, MD

## 2022-05-23 NOTE — Consult Note (Signed)
Gastroenterology Consult   Referring Provider: No ref. provider found Primary Care Physician:  Carrolyn Meiers, MD Primary Gastroenterologist:  Dr. Gala Romney  Patient ID: [name] [name]; [id number]; [birth year]   Admit date: 05/19/2022  LOS: 3 days   Date of Consultation: 05/23/2022  Reason for Consultation:  pancreatitis, cholelithiasis   History of Present Illness   [name] [name] is a 57 y.o. year old male with history of asthma, B12 deficiency, cirrhosis, former ETOH abuser, high cholesterol, HTN who presented to the ED with back pain and chest pain x5 days. Last drink 2 weeks ago.   ED Course:  [place] abd limited Cholelithiasis with mild gallbladder wall thickening, which could be from the patient's hypoproteinemia/hypoalbuminemia or inflammation. Sonographic Murphy's sign absent. Trace perihepatic ascites some of which is adjacent to the gallbladder. Correlate clinically in assessing for cholecystitis. -Nodular contour of the liver with mild nonfocal internal heterogeneity and mildly accentuated echogenicity. Findings are nonspecific but could reflect underlying cirrhosis. -Trace perihepatic ascites.  Hgb 9.4, Na 130, BUN 35, Creat 1.96, calcium 7.6, AST126, ALT 47, Alk phos 225, Lipase 2369   ED Consult: Patient reports Right sided pain that radiated to his back with associated nausea and vomiting. Having some associated diarrhea, having up to 3 stools per day that are more watery in nature. Denies rectal bleeding or melena. Denies hematemesis or coffee ground emesis. Last ETOH use was about 2 weeks ago when he drank one 40 oz beer. Previously drinking about one 40 oz beer per day, occasionally with a drink of liquor. Smokes 1/2 PPD.  Denies any fevers or chills. Denies any previous issues with pancreatitis. Appetite has been minimal but he has been able to drink liquids with no worsening of his pain. Pain is well controlled with pain meds   Last EGD:08/2021 - Normal  esophagus. -Retained food in stomach and duodenum precluded   complete examination. Patient reports last intake  of food 12 hours prior to this procedure. However,   family members state he likely ate since that time. Last Colonoscopy: 03/2022  Diverticulosis in the entire examined colon.   - Eight 5 to 8 mm polyps at the splenic flexure, in the ascending colon and in the proximal ascending  colon - One 11 mm polyp in the descending colon - The examination was otherwise normal on direct and retroflexion views.    Past Medical History:  Diagnosis Date   Asthma    B12 deficiency 03/15/2022   Cirrhosis ([place])    Dyspnea    High cholesterol    History of [name] stones    Hypertension    [name] stones     Past Surgical History:  Procedure Laterality Date   APPENDECTOMY     BIOPSY  07/22/2020   Procedure: BIOPSY;  Surgeon: Daneil Dolin, MD;  [facility];  Service: Endoscopy;;   COLONOSCOPY WITH PROPOFOL N/A 04/18/2017   non-bleeding internal hemorrhoids, two 4-6 mm polyps in descending colon and cecum, pancolonic diverticulosis, single cecal AVM. Tubular adenomas, surveillance in 2023.    COLONOSCOPY WITH PROPOFOL N/A 04/13/2022   Procedure: COLONOSCOPY WITH PROPOFOL;  Surgeon: Daneil Dolin, MD;  [facility]: [facility];  Service: Endoscopy;  Laterality: N/A;  9:00 AM   ESOPHAGOGASTRODUODENOSCOPY (EGD) WITH PROPOFOL N/A 07/22/2020   normal esophagus, small hiatal hernia, abnormal gastric mucosa, abnormal appearing ampula and periampullary mucosa. Mild chronic gastritis.   ESOPHAGOGASTRODUODENOSCOPY (EGD) WITH PROPOFOL N/A 09/08/2021   normal esophagus, retained food in stomach  and duodenum precluded complete examination.   FRACTURE SURGERY     left arm   IR PARACENTESIS  05/31/2021   LOWER EXTREMITY VENOGRAPHY N/A 08/10/2020   Procedure: LOWER EXTREMITY VENOGRAPHY;  Surgeon: Waynetta Sandy, MD;  Location: [facility];  Service: Cardiovascular;   Laterality: N/A;   PERIPHERAL VASCULAR BALLOON ANGIOPLASTY Left 08/10/2020   Procedure: PERIPHERAL VASCULAR BALLOON ANGIOPLASTY;  Surgeon: Waynetta Sandy, MD;  Location: [facility];  Service: Cardiovascular;  Laterality: Left;  lower extremity venous   PERIPHERAL VASCULAR THROMBECTOMY N/A 08/10/2020   Procedure: PERIPHERAL VASCULAR THROMBECTOMY;  Surgeon: Waynetta Sandy, MD;  Location: [facility];  Service: Cardiovascular;  Laterality: N/A;   POLYPECTOMY  04/18/2017   Procedure: POLYPECTOMY;  Surgeon: Daneil Dolin, MD;  [facility];  Service: Endoscopy;;   POLYPECTOMY  04/13/2022   Procedure: POLYPECTOMY;  Surgeon: Daneil Dolin, MD;  [facility];  Service: Endoscopy;;    Prior to Admission medications   Medication Sig Start Date End Date Taking? Authorizing Provider  acetaminophen (TYLENOL) 500 MG tablet Take 2,000 mg by mouth every 6 (six) hours as needed for moderate pain.   Yes [provider]  albuterol (PROVENTIL HFA) 108 (90 Base) MCG/ACT inhaler INHALE 2 PUFFS BY MOUTH EVERY 6 HOURS AS NEEDED FOR COUGHING, WHEEZING, OR SHORTNESS OF BREATH 02/10/21  Yes Emokpae, Courage, MD  aspirin 81 MG chewable tablet Chew 81 mg by mouth daily.   Yes [provider]  Cyanocobalamin (B-12 COMPLIANCE INJECTION IJ) Inject 1 Dose as directed every 30 (thirty) days.   Yes [provider]  furosemide (LASIX) 20 MG tablet Take 2 tablets in morning and 1 tablet in the afternoon. (40 milligrams in morning and 20 milligrams in afternoon). 07/13/21  Yes Annitta Needs, NP  Menthol-Methyl Salicylate (MUSCLE RUB) 10-15 % CREA Apply 1 Application topically as needed for muscle pain.   Yes [provider]  metoprolol tartrate (LOPRESSOR) 25 MG tablet Take 25 mg by mouth 2 (two) times daily. 05/24/21  Yes [provider]  mometasone-formoterol (DULERA) 200-5 MCG/ACT AERO Inhale 2 puffs into the lungs 2 (two)  times daily. 02/10/21  Yes Emokpae, Courage, MD  pantoprazole (PROTONIX) 40 MG tablet Take 1 tablet (40 mg total) by mouth daily. 09/01/21 09/01/22 Yes Annitta Needs, NP  spironolactone (ALDACTONE) 50 MG tablet Take 50-100 mg by mouth See admin instructions. Take 100 mg by mouth in morning and 50 mg in the afternoon   Yes [provider]  folic acid (FOLVITE) 510 MCG tablet Take 1 tablet (400 mcg total) by mouth daily. Patient not taking: Reported on 05/19/2022 03/15/22   [name], PA-C  ondansetron (ZOFRAN-ODT) 4 MG disintegrating tablet 4 mg every 8 (eight) hours as needed. Patient not taking: Reported on 05/19/2022 04/17/22   [provider]  sodium bicarbonate 650 MG tablet Take 650 mg by mouth 2 (two) times daily. Patient not taking: Reported on 05/19/2022    [provider]    Current Facility-Administered Medications  Medication Dose Route Frequency Provider Last Rate Last Admin   acetaminophen (TYLENOL) tablet 650 mg  650 mg Oral Q6H PRN Zierle-Ghosh, Asia B, DO       Or   acetaminophen (TYLENOL) suppository 650 mg  650 mg Rectal Q6H PRN Zierle-Ghosh, Asia B, DO       aspirin chewable tablet 81 mg  81 mg Oral Daily Zierle-Ghosh, Asia B, DO  81 mg at 05/23/22 9323   cefTRIAXone (ROCEPHIN) 1 g in sodium chloride 0.9 % 100 mL IVPB  1 g Intravenous Q24H Zierle-Ghosh, Asia B, DO 200 mL/hr at 05/22/22 2209 1 g at 05/22/22 2209   enoxaparin (LOVENOX) injection 40 mg  40 mg Subcutaneous Q24H Kayleen Memos, DO   40 mg at 55/73/22 0254   folic acid (FOLVITE) tablet 1 mg  1 mg Oral Daily Zierle-Ghosh, Asia B, DO   1 mg at 05/23/22 2706   HYDROmorphone (DILAUDID) injection 0.5 mg  0.5 mg Intravenous Q4H PRN Irene Pap N, DO   0.5 mg at 05/23/22 2376   lactated ringers infusion   Intravenous Continuous Heath Lark D, DO 125 mL/hr at 05/23/22 0920 Rate Change at 05/23/22 0920   metoprolol tartrate (LOPRESSOR) tablet 25 mg  25 mg Oral BID Zierle-Ghosh, Asia B, DO    25 mg at 05/23/22 2831   mometasone-formoterol (DULERA) 200-5 MCG/ACT inhaler 2 puff  2 puff Inhalation BID Zierle-Ghosh, Asia B, DO   2 puff at 05/23/22 0737   multivitamin with minerals tablet 1 tablet  1 tablet Oral Daily Zierle-Ghosh, Asia B, DO   1 tablet at 05/23/22 5176   nicotine (NICODERM CQ - dosed in mg/24 hours) patch 14 mg  14 mg Transdermal Daily Zierle-Ghosh, Asia B, DO   14 mg at 05/23/22 0924   ondansetron (ZOFRAN) tablet 4 mg  4 mg Oral Q6H PRN Zierle-Ghosh, Asia B, DO       Or   ondansetron (ZOFRAN) injection 4 mg  4 mg Intravenous Q6H PRN Zierle-Ghosh, Asia B, DO   4 mg at 05/21/22 1700   oxyCODONE (Oxy IR/ROXICODONE) immediate release tablet 5 mg  5 mg Oral Q4H PRN Zierle-Ghosh, Asia B, DO   5 mg at 05/22/22 1412   sennosides (SENOKOT) 8.8 MG/5ML syrup 5 mL  5 mL Oral QHS Hall, Carole N, DO       spironolactone (ALDACTONE) tablet 100 mg  100 mg Oral Daily Zierle-Ghosh, Asia B, DO   100 mg at 05/23/22 1607   spironolactone (ALDACTONE) tablet 50 mg  50 mg Oral QPM Zierle-Ghosh, Asia B, DO   50 mg at 05/22/22 1659   thiamine (VITAMIN B1) tablet 100 mg  100 mg Oral Daily Zierle-Ghosh, Asia B, DO   100 mg at 05/23/22 3710   Or   thiamine (VITAMIN B1) injection 100 mg  100 mg Intravenous Daily Zierle-Ghosh, Asia B, DO        Allergies as of 05/19/2022   (No Known Allergies)    Family History  Problem Relation Age of Onset   Cancer Father        throat   Diabetes Sister    Colon cancer Neg Hx     Social History   Socioeconomic History   Marital status: Single    Spouse name: Not on file   Number of children: Not on file   Years of education: Not on file   Highest education level: Not on file  Occupational History   Not on file  Tobacco Use   Smoking status: Every Day    Packs/day: 0.50    Years: 33.00    Total pack years: 16.50    Types: Cigarettes   Smokeless tobacco: Never  Vaping Use   Vaping Use: Never used  Substance and Sexual Activity   Alcohol  use: Not Currently    Comment: last deink 1 week ago   Drug use: No  Sexual activity: Yes    Birth control/protection: None  Other Topics Concern   Not on file  Social History Narrative   Not on file   Social Determinants of Health   Financial Resource Strain: High Risk (09/13/2020)   Overall Financial Resource Strain (CARDIA)    Difficulty of Paying Living Expenses: Very hard  Food Insecurity: No Food Insecurity (05/20/2022)   Hunger Vital Sign    Worried About Running Out of Food in the Last Year: Never true    Ran Out of Food in the Last Year: Never true  Transportation Needs: No Transportation Needs (05/20/2022)   PRAPARE - Hydrologist (Medical): No    Lack of Transportation (Non-Medical): No  Physical Activity: Sufficiently Active (09/13/2020)   Exercise Vital Sign    Days of Exercise per Week: 7 days    Minutes of Exercise per Session: 30 min  Stress: No Stress Concern Present (09/13/2020)   [place]    Feeling of Stress : Only a little  Social Connections: Moderately Isolated (09/13/2020)   Social Connection and Isolation Panel [NHANES]    Frequency of Communication with Friends and Family: More than three times a week    Frequency of Social Gatherings with Friends and Family: Once a week    Attends Religious Services: More than 4 times per year    Active Member of [employer] or Organizations: No    Attends Archivist Meetings: Never    Marital Status: Never married  Intimate Partner Violence: Not At Risk (05/20/2022)   Humiliation, Afraid, Rape, and Kick questionnaire    Fear of Current or Ex-Partner: No    Emotionally Abused: No    Physically Abused: No    Sexually Abused: No    Review of Systems   Gen: Denies any fever, chills, loss of appetite, change in weight or weight loss CV: Denies chest pain, heart palpitations, syncope, edema  Resp: Denies shortness of  breath with rest, cough, wheezing, coughing up blood, and pleurisy. GI:  denies melena, hematochezia, constipation, dysphagia, odyonophagia, early satiety or weight loss. +nausea +vomiting +diarrhea +RUQ pain GU : Denies urinary burning, blood in urine, urinary frequency, and urinary incontinence. MS: Denies joint pain, limitation of movement, swelling, cramps, and atrophy.  Derm: Denies rash, itching, dry skin, hives. Psych: Denies depression, anxiety, memory loss, hallucinations, and confusion. Heme: Denies bruising or bleeding Neuro:  Denies any headaches, dizziness, paresthesias, shaking  Physical Exam   Vital Signs in last 24 hours: Temp:  [97.9 F (36.6 C)-98.6 F (37 C)] 98.6 F (37 C) (01/23 0601) Pulse Rate:  [79-86] 79 (01/23 0601) Resp:  [18-19] 19 (01/23 0601) BP: (109-123)/(72-82) 113/82 (01/23 0601) SpO2:  [99 %-100 %] 99 % (01/23 0737) Last BM Date : 05/21/22  General:   Alert,  Well-developed, well-nourished, pleasant and cooperative in NAD Head:  Normocephalic and atraumatic. Eyes:  Sclera clear, no icterus.   Conjunctiva pink. Ears:  Normal auditory acuity. Mouth:  No deformity or lesions, dentition normal. Neck:  Supple; no masses Lungs:  Clear throughout to auscultation.   No wheezes, crackles, or rhonchi. No acute distress. Heart:  Regular rate and rhythm; no murmurs, clicks, rubs,  or gallops. Abdomen:  Soft, nontender and nondistended. No murphy's sign. No masses, hepatosplenomegaly or hernias noted. Normal bowel sounds, without guarding, and without rebound.   Msk:  Symmetrical without gross deformities. Normal posture. Extremities:  Without clubbing or edema.  Neurologic:  Alert and  oriented x4. Skin:  Intact without significant lesions or rashes. Psych:  Alert and cooperative. Normal mood and affect.  Intake/Output from previous day: 01/22 0701 - 01/23 0700 In: 4099.2 [P.O.:510; I.V.:3389.2; IV Piggyback:200] Out: 450 [Urine:450] Intake/Output this  shift: No intake/output data recorded.  Labs/Studies   Recent Labs Recent Labs    05/21/22 0613 05/22/22 0547 05/23/22 0439  WBC 7.6 7.2 9.0  HGB 11.2* 9.4* 9.1*  HCT 33.2* 28.1* 28.3*  PLT 241 214 205   BMET Recent Labs    05/21/22 0613 05/22/22 0547 05/23/22 0439  NA 128* 130* 133*  K 4.4 4.4 4.5  CL 102 106 110  CO2 19* 20* 19*  GLUCOSE 139* 116* 88  BUN 52* 35* 24*  CREATININE 2.14* 1.96* 1.55*  CALCIUM 8.1* 7.6* 7.8*   LFT Recent Labs    05/21/22 0613 05/22/22 0547 05/23/22 0439  PROT 6.8 6.3* 5.9*  ALBUMIN 2.3* 2.1* 1.9*  AST 95* 126* 70*  ALT 35 47* 37  ALKPHOS 261* 225* 183*  BILITOT 0.6 0.6 0.3    Radiology/Studies US Abdomen Limited RUQ (LIVER/GB)  Result Date: 05/22/2022 CLINICAL DATA:  Right upper quadrant abdominal pain EXAM: ULTRASOUND ABDOMEN LIMITED RIGHT UPPER QUADRANT COMPARISON:  CT scan 05/19/2022 FINDINGS: Gallbladder: Poorly shadowing gallstones are present in the gallbladder and appear mobile, largest 1.5 cm. Sonographic Murphy's sign absent. Mild gallbladder wall thickening at 0.3 cm. Trace perihepatic ascites including in the gallbladder fossa. Common bile duct: Diameter: 0.5 cm, within normal limits Liver: Nodular contour with mild nonfocal internal heterogeneity and mildly accentuated echogenicity. Portal vein is patent on color Doppler imaging with normal direction of blood flow towards the liver. Other: None. IMPRESSION: 1. Cholelithiasis with mild gallbladder wall thickening, which could be from the patient's hypoproteinemia/hypoalbuminemia or inflammation. Sonographic Murphy's sign absent. Trace perihepatic ascites some of which is adjacent to the gallbladder. Correlate clinically in assessing for cholecystitis. 2. Nodular contour of the liver with mild nonfocal internal heterogeneity and mildly accentuated echogenicity. Findings are nonspecific but could reflect underlying cirrhosis. 3. Trace perihepatic ascites. Electronically Signed    By: Van Clines M.D.   On: 05/22/2022 08:56     Assessment   [name] [name] is a 57 y.o. year old male with history of asthma, B12 deficiency, cirrhosis, former ETOH abuser, high cholesterol, HTN who presented to the ED with back pain and chest pain x5 days. Last drink 2 weeks ago. Lipase found to be 2369, [place] Abd with gallstones. GI consulted for further evaluation.  Pancreatitis: history of alcohol abuse, last drink 2 weeks ago with reported one 40 oz beer, began with  n/v/ RUQ Pain that radiated into the back about 2 weeks ago. Tolerating liquids at home but not foods, though states he is hungry now. Lipase 2369. Abdominal exam is benign. Can advance diet to clear liquids if general surgery not planning any surgical intervention. Discussed importance of complete alcohol and Tobacco cessation with the patient both in regards to his liver disease and pancreatitis. May Consider CT imaging of pancreas if symptoms are not improving.   Cholelithiasis/possible cholecystitis: LFTs mildly elevated though appears these have been mildly elevated for the past almost year, likely secondary to known Cirrhosis, trending down today,T bili 0.3. no indications of biliary obstruction. No murphy's sign on abdominal exam. No fevers or chills. Recommend General surgery consult for further evaluation of concern for GB wall thickening on Abd [place], though findings may be secondary to acute pancreatitis,  as above.   Macrocytic anemia: hx of B12/folate deficiency, likely influenced by chronic ETOH abuse. Previous iron studies in November were actually elevated. No overt GI bleeding. Will continue to trend h&h.   Plan / Recommendations   PPI Daily Complete etoh and tobacco cessation General surgery consult Can advance diet to clears if no surgical intervention planned  5. Pain and nausea meds per hospitalist  6. B12 and folate supplementation per hospitalist 7. Trend h&h    05/23/2022, 9:45 AM   Sohil Timko L.  Alver Sorrow, MSN, APRN, AGNP-C Adult-Gerontology Nurse Practitioner [facility]

## 2022-05-24 ENCOUNTER — Telehealth (INDEPENDENT_AMBULATORY_CARE_PROVIDER_SITE_OTHER): Payer: Self-pay | Admitting: Gastroenterology

## 2022-05-24 DIAGNOSIS — K859 Acute pancreatitis without necrosis or infection, unspecified: Secondary | ICD-10-CM

## 2022-05-24 DIAGNOSIS — N179 Acute kidney failure, unspecified: Secondary | ICD-10-CM | POA: Diagnosis not present

## 2022-05-24 LAB — COMPREHENSIVE METABOLIC PANEL
ALT: 32 U/L (ref 0–44)
AST: 56 U/L — ABNORMAL HIGH (ref 15–41)
Albumin: 1.8 g/dL — ABNORMAL LOW (ref 3.5–5.0)
Alkaline Phosphatase: 159 U/L — ABNORMAL HIGH (ref 38–126)
Anion gap: 4 — ABNORMAL LOW (ref 5–15)
BUN: 16 mg/dL (ref 6–20)
CO2: 20 mmol/L — ABNORMAL LOW (ref 22–32)
Calcium: 7.8 mg/dL — ABNORMAL LOW (ref 8.9–10.3)
Chloride: 111 mmol/L (ref 98–111)
Creatinine, Ser: 1.27 mg/dL — ABNORMAL HIGH (ref 0.61–1.24)
GFR, Estimated: >60 mL/min (ref >60)
Glucose, Bld: 100 mg/dL — ABNORMAL HIGH (ref 70–99)
Potassium: 4.2 mmol/L (ref 3.5–5.1)
Sodium: 135 mmol/L (ref 135–145)
Total Bilirubin: 0.3 mg/dL (ref 0.3–1.2)
Total Protein: 5.6 g/dL — ABNORMAL LOW (ref 6.5–8.1)

## 2022-05-24 LAB — IRON AND TIBC
Iron: 133 ug/dL (ref 45–182)
Saturation Ratios: 77 % — ABNORMAL HIGH (ref 17.9–39.5)
TIBC: 172 ug/dL — ABNORMAL LOW (ref 250–450)
UIBC: 39 ug/dL

## 2022-05-24 LAB — CBC
HCT: 25.8 % — ABNORMAL LOW (ref 39.0–52.0)
Hemoglobin: 8.4 g/dL — ABNORMAL LOW (ref 13.0–17.0)
MCH: 32.2 pg (ref 26.0–34.0)
MCHC: 32.6 g/dL (ref 30.0–36.0)
MCV: 98.9 fL (ref 80.0–100.0)
Platelets: 212 10*3/uL (ref 150–400)
RBC: 2.61 MIL/uL — ABNORMAL LOW (ref 4.22–5.81)
RDW: 14.5 % (ref 11.5–15.5)
WBC: 7.6 10*3/uL (ref 4.0–10.5)
nRBC: 0 % (ref 0.0–0.2)

## 2022-05-24 LAB — RETICULOCYTES
Immature Retic Fract: 8 % (ref 2.3–15.9)
RBC.: 2.58 MIL/uL — ABNORMAL LOW (ref 4.22–5.81)
Retic Count, Absolute: 31.7 10*3/uL (ref 19.0–186.0)
Retic Ct Pct: 1.2 % (ref 0.4–3.1)

## 2022-05-24 LAB — FOLATE: Folate: 8.2 ng/mL (ref >5.9)

## 2022-05-24 LAB — LIPASE, BLOOD: Lipase: 130 U/L — ABNORMAL HIGH (ref 11–51)

## 2022-05-24 LAB — VITAMIN B12: Vitamin B-12: 1216 pg/mL — ABNORMAL HIGH (ref 180–914)

## 2022-05-24 LAB — FERRITIN: Ferritin: 1477 ng/mL — ABNORMAL HIGH (ref 24–336)

## 2022-05-24 LAB — MAGNESIUM: Magnesium: 1.5 mg/dL — ABNORMAL LOW (ref 1.7–2.4)

## 2022-05-24 MED ORDER — MAGNESIUM SULFATE 2 GM/50ML IV SOLN
2.0000 g | Freq: Once | INTRAVENOUS | Status: AC
  Administered 2022-05-24: 2 g via INTRAVENOUS
  Filled 2022-05-24: qty 50

## 2022-05-24 NOTE — TOC Transition Note (Signed)
Transition of Care [facility]) - CM/SW Discharge Note   Patient Details  Name: [name] [name] MRN: [id number] Date of Birth: 08/03/1965  Transition of Care [facility]) CM/SW Contact:  Shade Flood, LCSW Phone Number: 05/24/2022, 10:18 AM   Clinical Narrative:     Pt stable for dc today per MD. No further TOC needs for dc.  Final next level of care: Home/Self Care Barriers to Discharge: Barriers Resolved   Patient Goals and CMS Choice      Discharge Placement                         Discharge Plan and Services Additional resources added to the After Visit Summary for   In-house Referral: Clinical Social Work              DME Arranged: Kasandra Knudsen DME Agency: [employer] Date DME Agency Contacted: 05/22/22 Time DME Agency Contacted: [phone] Representative spoke with at DME Agency: [place] Determinants of Health ([place]) Interventions SDOH Screenings   Food Insecurity: No Food Insecurity (05/20/2022)  Housing: Low Risk  (05/20/2022)  Transportation Needs: No Transportation Needs (05/20/2022)  Utilities: Not At Risk (05/20/2022)  Alcohol Screen: Low Risk  (09/13/2020)  Financial Resource Strain: High Risk (09/13/2020)  Physical Activity: Sufficiently Active (09/13/2020)  Social Connections: Moderately Isolated (09/13/2020)  Stress: No Stress Concern Present (09/13/2020)  Tobacco Use: High Risk (05/19/2022)     Readmission Risk Interventions    05/22/2022    9:08 AM  Readmission Risk Prevention Plan  Transportation Screening Complete  HRI or Home Care Consult Complete  Social Work Consult for [place] Planning/Counseling Complete  Palliative Care Screening Not Applicable  Medication Review Press photographer) Complete

## 2022-05-24 NOTE — Progress Notes (Signed)
Subjective: Patient states he is feeling better today, no abdominal pain at current. Denies nausea or vomiting. He is dressed and ready for discharge.   Objective: Vital signs in last 24 hours: Temp:  [98.2 F (36.8 C)-98.6 F (37 C)] 98.6 F (37 C) (01/24 0500) Pulse Rate:  [71-78] 71 (01/24 0500) Resp:  [18-20] 20 (01/24 0500) BP: (113-128)/(73-82) 128/76 (01/24 0500) SpO2:  [98 %-100 %] 100 % (01/24 0708) Last BM Date : 05/23/22 General:   Alert and oriented, pleasant Head:  Normocephalic and atraumatic. Eyes:  No icterus, sclera clear. Conjuctiva pink.  Mouth:  Without lesions, mucosa pink and moist.  Heart:  S1, S2 present, no murmurs noted.  Lungs: Clear to auscultation bilaterally, without wheezing, rales, or rhonchi.  Abdomen:  Bowel sounds present, soft, non-tender, non-distended. No HSM or hernias noted. No rebound or guarding. No masses appreciated  Msk:  Symmetrical without gross deformities. Normal posture. Pulses:  Normal pulses noted. Extremities:  Without clubbing or edema. Neurologic:  Alert and  oriented x4;  grossly normal neurologically. Skin:  Warm and dry, intact without significant lesions.  Psych:  Alert and cooperative. Normal mood and affect.  Intake/Output from previous day: 01/23 0701 - 01/24 0700 In: 240 [P.O.:240] Out: -  Intake/Output this shift: Total I/O In: 2089.7 [I.V.:1989.7; IV Piggyback:100] Out: -   Lab Results: Recent Labs    05/22/22 0547 05/23/22 0439 05/24/22 0453  WBC 7.2 9.0 7.6  HGB 9.4* 9.1* 8.4*  HCT 28.1* 28.3* 25.8*  PLT 214 205 212   BMET Recent Labs    05/22/22 0547 05/23/22 0439 05/24/22 0453  NA 130* 133* 135  K 4.4 4.5 4.2  CL 106 110 111  CO2 20* 19* 20*  GLUCOSE 116* 88 100*  BUN 35* 24* 16  CREATININE 1.96* 1.55* 1.27*  CALCIUM 7.6* 7.8* 7.8*   LFT Recent Labs    05/22/22 0547 05/23/22 0439 05/24/22 0453  PROT 6.3* 5.9* 5.6*  ALBUMIN 2.1* 1.9* 1.8*  AST 126* 70* 56*  ALT 47* 37 32   ALKPHOS 225* 183* 159*  BILITOT 0.6 0.3 0.3    Studies/Results: MR ABDOMEN MRCP W WO CONTAST  Result Date: 05/23/2022 CLINICAL DATA:  Severe acute pancreatitis and abdominal pain. EXAM: MRI ABDOMEN WITHOUT AND WITH CONTRAST (INCLUDING MRCP) TECHNIQUE: Multiplanar multisequence MR imaging of the abdomen was performed both before and after the administration of intravenous contrast. Heavily T2-weighted images of the biliary and pancreatic ducts were obtained, and three-dimensional MRCP images were rendered by post processing. CONTRAST:  50m GADAVIST GADOBUTROL 1 MMOL/ML IV SOLN COMPARISON:  May 19, 2022 CT and imaging from May 22, 2022. FINDINGS: Lower chest: Incidental imaging of the lung bases with trace effusions. Hepatobiliary: No focal, suspicious hepatic lesion. Fissural widening of hepatic fissures and mild notching of hepatic margins. No pericholecystic stranding. No wall thickening or hyperenhancement of the gallbladder wall. Sludge and small stones in the dependent gallbladder. No biliary duct dilation or choledocholithiasis. MRCP sequences are mildly motion limited caliber of the common bile duct is approximately 5-6 mm. Portal vein is patent. Mild hepatic and splenic iron deposition is suggested. Pancreas: Signs of acute interstitial edema of the pancreatic-duodenal groove. Edematous changes tract throughout the proximal body and about the celiac. There is no focal pancreatic fluid collection. There is a subtle area in the head of the pancreas on T2 weighted imaging anterior to the common bile duct on image 22/5 this is of intermediate T2 signal a measures 6 mm. There  is smooth, tapered appearing narrowing of the distal common bile duct in this area perhaps from edema though with nonspecific features. Also seen on image 21/12. Mild blunting of normal T1 signal in the pancreas with geographic features about the pancreatic head and proximal body. Glandular enhancement is preserved. Spleen:   Normal. Adrenals/Urinary Tract: Marked LEFT renal cortical scarring. No suspicious renal lesion or hydronephrosis. Adrenal glands are normal. Stomach/Bowel: Normal to the extent evaluated. Vascular/Lymphatic:  Patent splenic vein.  No adenopathy. Other:  Trace perihepatic ascites Musculoskeletal: No suspicious bone lesions identified. IMPRESSION: 1. Signs of acute interstitial edema of the pancreatic-duodenal groove and proximal body of the pancreas. No large pancreatic fluid collection. No peripancreatic fluid collection currently with edema tracking throughout tissue planes in the upper abdomen. 2. 6 mm area of increased T2 signal which is mainly intermediate T2 signal without visible lesion on post-contrast imaging in this area suggests focal inflammation or developing intrapancreatic pseudocyst and is at an area where there is subtle ductal transition of the common bile duct. Perhaps this relates to edema compressing the duct. Would however suggest 6-8 week follow-up after resolution of acute symptoms to assess for any change, exclude underlying lesion and further evaluate this area given motion on current imaging on MRCP sequences. 3. Sludge and small stones in the dependent gallbladder. No biliary duct dilation or choledocholithiasis. 4. Mild hepatic and splenic iron deposition is suggested. Fissural widening of hepatic fissures should be correlated with any clinical or laboratory evidence of liver disease. No overt signs of portal hypertension. 5. Marked LEFT renal cortical scarring. Electronically Signed   By: Zetta Bills M.D.   On: 05/23/2022 19:24   MR 3D Recon At Scanner  Result Date: 05/23/2022 CLINICAL DATA:  Severe acute pancreatitis and abdominal pain. EXAM: MRI ABDOMEN WITHOUT AND WITH CONTRAST (INCLUDING MRCP) TECHNIQUE: Multiplanar multisequence MR imaging of the abdomen was performed both before and after the administration of intravenous contrast. Heavily T2-weighted images of the biliary  and pancreatic ducts were obtained, and three-dimensional MRCP images were rendered by post processing. CONTRAST:  81m GADAVIST GADOBUTROL 1 MMOL/ML IV SOLN COMPARISON:  May 19, 2022 CT and imaging from May 22, 2022. FINDINGS: Lower chest: Incidental imaging of the lung bases with trace effusions. Hepatobiliary: No focal, suspicious hepatic lesion. Fissural widening of hepatic fissures and mild notching of hepatic margins. No pericholecystic stranding. No wall thickening or hyperenhancement of the gallbladder wall. Sludge and small stones in the dependent gallbladder. No biliary duct dilation or choledocholithiasis. MRCP sequences are mildly motion limited caliber of the common bile duct is approximately 5-6 mm. Portal vein is patent. Mild hepatic and splenic iron deposition is suggested. Pancreas: Signs of acute interstitial edema of the pancreatic-duodenal groove. Edematous changes tract throughout the proximal body and about the celiac. There is no focal pancreatic fluid collection. There is a subtle area in the head of the pancreas on T2 weighted imaging anterior to the common bile duct on image 22/5 this is of intermediate T2 signal a measures 6 mm. There is smooth, tapered appearing narrowing of the distal common bile duct in this area perhaps from edema though with nonspecific features. Also seen on image 21/12. Mild blunting of normal T1 signal in the pancreas with geographic features about the pancreatic head and proximal body. Glandular enhancement is preserved. Spleen:  Normal. Adrenals/Urinary Tract: Marked LEFT renal cortical scarring. No suspicious renal lesion or hydronephrosis. Adrenal glands are normal. Stomach/Bowel: Normal to the extent evaluated. Vascular/Lymphatic:  Patent splenic  vein.  No adenopathy. Other:  Trace perihepatic ascites Musculoskeletal: No suspicious bone lesions identified. IMPRESSION: 1. Signs of acute interstitial edema of the pancreatic-duodenal groove and proximal  body of the pancreas. No large pancreatic fluid collection. No peripancreatic fluid collection currently with edema tracking throughout tissue planes in the upper abdomen. 2. 6 mm area of increased T2 signal which is mainly intermediate T2 signal without visible lesion on post-contrast imaging in this area suggests focal inflammation or developing intrapancreatic pseudocyst and is at an area where there is subtle ductal transition of the common bile duct. Perhaps this relates to edema compressing the duct. Would however suggest 6-8 week follow-up after resolution of acute symptoms to assess for any change, exclude underlying lesion and further evaluate this area given motion on current imaging on MRCP sequences. 3. Sludge and small stones in the dependent gallbladder. No biliary duct dilation or choledocholithiasis. 4. Mild hepatic and splenic iron deposition is suggested. Fissural widening of hepatic fissures should be correlated with any clinical or laboratory evidence of liver disease. No overt signs of portal hypertension. 5. Marked LEFT renal cortical scarring. Electronically Signed   By: Zetta Bills M.D.   On: 05/23/2022 19:24    Assessment: [name] [name] is a 57 y.o. year old male with history of asthma, B12 deficiency, cirrhosis, former ETOH abuser, high cholesterol, HTN who presented to the ED with back pain and chest pain x5 days. Last drink 2 weeks ago. Lipase found to be 2369, [place] Abd with gallstones. GI consulted for further evaluation.   Pancreatitis:  history of alcohol abuse, last drink 2 weeks ago with reported one 40 oz beer, began with  n/v/ RUQ Pain that radiated into the back about 2 weeks ago. Lipase 2369, trending down significantly on repeat. Would recommend against continued lipase trending. MRCP yesterday with Signs of acute interstitial edema of the pancreatic-duodenal groove and proximal body of the pancreas. There was focal inflammation/ developing intrapancreatic pseudocyst and is  at an area where there is subtle ductal transition of the CBD, query edema causing compression of the duct. Sludge and small stones in GB. Will need follow up imaging in 6-8 weeks for re-evaluation of the above findings  Cholelithiasis/possible cholecystitis: LFTs mildly elevated though appears these have been mildly elevated for the past almost year, likely secondary to known Cirrhosis though MRCP yesterday with subtle ductal transition of CBD, query edema causing compression of the duct, sludge and small stones in GB. General surgery saw patient today. Given cirrhosis, he is too complex for GB removal here. Referral appears to have been sent to [facility] GI/hepatology per hospitalist for further evaluation/discussion of GB removal at tertiary facility.   Macrocytic anemia: hx of B12/folate deficiency, likely influenced by chronic ETOH abuse. Previous iron studies in November were actually elevated. No overt GI bleeding.  Plan: PPI daily Complete ETOH and tobacco cessation  Continued b12 and folate supplementation Repeat MRCP 6-8 weeks for evaluation of CBD compression Follow up with [facility]/Hepatology Local GI f/u 2-3 weeks    LOS: 4 days    05/24/2022, 9:59 AM   Ova Gillentine L. Alver Sorrow, MSN, APRN, AGNP-C Adult-Gerontology Nurse Practitioner [facility]

## 2022-05-24 NOTE — Telephone Encounter (Signed)
See inpatient progress note from today

## 2022-05-24 NOTE — Consult Note (Signed)
[facility] Consult  Reason for Consult: Gallstones Pancreatitis  Referring Physician: Dr. Manuella Ghazi   Chief Complaint   Chest Pain     HPI: [name] [name] is a 57 y.o. male with alcoholic cirrhosis at baseline who came into the hospital with chest pain, abdominal pain. The patient was present for the last 2 weeks. He was worked up with a RUQ [place] and noted to have gallstones. He reported he had not been drinking for the last 2 weeks or so, and while he was in the hospital his lipase has jumped up to 2369.  He also had a CT scan that was concerning for possible pancreatic head mass.  Given all of this an MRCP was obtained and demonstrated no obvious cholecystitis or choledocholithiasis but with pancreatitis and recommended follow up to exclude any underlying lesions.   Dr. Jenetta Downer saw the patient and due to the cirrhosis history he felt that he would likely benefit from cholecystectomy at a tertiary care center.    Past Medical History:  Diagnosis Date   Asthma    B12 deficiency 03/15/2022   Cirrhosis ([place])    Dyspnea    High cholesterol    History of kidney stones    Hypertension    Kidney stones     Past Surgical History:  Procedure Laterality Date   APPENDECTOMY     BIOPSY  07/22/2020   Procedure: BIOPSY;  Surgeon: Daneil Dolin, MD;  [facility];  Service: Endoscopy;;   COLONOSCOPY WITH PROPOFOL N/A 04/18/2017   non-bleeding internal hemorrhoids, two 4-6 mm polyps in descending colon and cecum, pancolonic diverticulosis, single cecal AVM. Tubular adenomas, surveillance in 2023.    COLONOSCOPY WITH PROPOFOL N/A 04/13/2022   Procedure: COLONOSCOPY WITH PROPOFOL;  Surgeon: Daneil Dolin, MD;  [facility];  Service: Endoscopy;  Laterality: N/A;  9:00 AM   ESOPHAGOGASTRODUODENOSCOPY (EGD) WITH PROPOFOL N/A 07/22/2020   normal esophagus, small hiatal hernia, abnormal gastric mucosa, abnormal appearing ampula and periampullary mucosa. Mild  chronic gastritis.   ESOPHAGOGASTRODUODENOSCOPY (EGD) WITH PROPOFOL N/A 09/08/2021   normal esophagus, retained food in stomach and duodenum precluded complete examination.   FRACTURE SURGERY     left arm   IR PARACENTESIS  05/31/2021   LOWER EXTREMITY VENOGRAPHY N/A 08/10/2020   Procedure: LOWER EXTREMITY VENOGRAPHY;  Surgeon: Waynetta Sandy, MD;  Location: [place] [facility];  Service: Cardiovascular;  Laterality: N/A;   PERIPHERAL VASCULAR BALLOON ANGIOPLASTY Left 08/10/2020   Procedure: PERIPHERAL VASCULAR BALLOON ANGIOPLASTY;  Surgeon: Waynetta Sandy, MD;  Location: [place] [facility];  Service: Cardiovascular;  Laterality: Left;  lower extremity venous   PERIPHERAL VASCULAR THROMBECTOMY N/A 08/10/2020   Procedure: PERIPHERAL VASCULAR THROMBECTOMY;  Surgeon: Waynetta Sandy, MD;  Location: [place] [facility];  Service: Cardiovascular;  Laterality: N/A;   POLYPECTOMY  04/18/2017   Procedure: POLYPECTOMY;  Surgeon: Daneil Dolin, MD;  [facility];  Service: Endoscopy;;   POLYPECTOMY  04/13/2022   Procedure: POLYPECTOMY;  Surgeon: Daneil Dolin, MD;  [facility];  Service: Endoscopy;;    Family History  Problem Relation Age of Onset   Cancer Father        throat   Diabetes Sister    Colon cancer Neg Hx     Social History   Tobacco Use   Smoking status: Every Day    Packs/day: 0.50    Years: 33.00    Total pack years: 16.50  Types: Cigarettes   Smokeless tobacco: Never  Vaping Use   Vaping Use: Never used  Substance Use Topics   Alcohol use: Not Currently    Comment: last deink 1 week ago   Drug use: No    Medications: I have reviewed the patient's current medications. Prior to Admission:  No medications prior to admission.   Scheduled:  aspirin  81 mg Oral Daily   enoxaparin (LOVENOX) injection  40 mg Subcutaneous T70Y   folic acid  1 mg Oral Daily   metoprolol tartrate  25 mg Oral BID    mometasone-formoterol  2 puff Inhalation BID   multivitamin with minerals  1 tablet Oral Daily   nicotine  14 mg Transdermal Daily   sennosides  5 mL Oral QHS   spironolactone  100 mg Oral Daily   spironolactone  50 mg Oral QPM   thiamine  100 mg Oral Daily   Or   thiamine  100 mg Intravenous Daily   Continuous:  cefTRIAXone (ROCEPHIN)  IV Stopped (05/23/22 2213)   lactated ringers 75 mL/hr at 05/24/22 0855   FVC:BSWHQPRFFMBWG **OR** acetaminophen, HYDROmorphone (DILAUDID) injection, ondansetron **OR** ondansetron (ZOFRAN) IV, oxyCODONE  No Known Allergies   ROS:  A comprehensive review of systems was negative except for: Gastrointestinal: positive for abdominal pain  Blood pressure 128/76, pulse 71, temperature 98.6 F (37 C), temperature source Oral, resp. rate 20, height 6' (1.829 m), weight 77.1 kg, SpO2 100 %. Physical Exam Vitals reviewed.  HENT:     Head: Normocephalic.  Cardiovascular:     Rate and Rhythm: Normal rate.     Heart sounds: Normal heart sounds.  Pulmonary:     Effort: Pulmonary effort is normal.  Abdominal:     General: There is abdominal bruit.     Palpations: Abdomen is soft.     Tenderness: There is abdominal tenderness.     Comments: Minor tenderness in the epigastric /RUQ region  Musculoskeletal:     Right lower leg: No edema.  Skin:    General: Skin is warm.  Neurological:     General: No focal deficit present.     Mental Status: He is alert.  Psychiatric:        Mood and Affect: Mood normal.     Results: Results for orders placed or performed during the hospital encounter of 05/19/22 (from the past 48 hour(s))  CBC     Status: Abnormal   Collection Time: 05/23/22  4:39 AM  Result Value Ref Range   WBC 9.0 4.0 - 10.5 K/uL   RBC 2.82 (L) 4.22 - 5.81 MIL/uL   Hemoglobin 9.1 (L) 13.0 - 17.0 g/dL   HCT 28.3 (L) 39.0 - 52.0 %   MCV 100.4 (H) 80.0 - 100.0 fL   MCH 32.3 26.0 - 34.0 pg   MCHC 32.2 30.0 - 36.0 g/dL   RDW 14.5 11.5 - 15.5  %   Platelets 205 150 - 400 K/uL   nRBC 0.0 0.0 - 0.2 %    Comment: Performed at [facility], [address] [place], [place] [address]  Comprehensive metabolic panel     Status: Abnormal   Collection Time: 05/23/22  4:39 AM  Result Value Ref Range   Sodium 133 (L) 135 - 145 mmol/L   Potassium 4.5 3.5 - 5.1 mmol/L   Chloride 110 98 - 111 mmol/L   CO2 19 (L) 22 - 32 mmol/L   Glucose, Bld 88 70 - 99 mg/dL  Comment: Glucose reference range applies only to samples taken after fasting for at least 8 hours.   BUN 24 (H) 6 - 20 mg/dL   Creatinine, Ser 1.55 (H) 0.61 - 1.24 mg/dL   Calcium 7.8 (L) 8.9 - 10.3 mg/dL   Total Protein 5.9 (L) 6.5 - 8.1 g/dL   Albumin 1.9 (L) 3.5 - 5.0 g/dL   AST 70 (H) 15 - 41 U/L   ALT 37 0 - 44 U/L   Alkaline Phosphatase 183 (H) 38 - 126 U/L   Total Bilirubin 0.3 0.3 - 1.2 mg/dL   GFR, Estimated 52 (L) >60 mL/min    Comment: (NOTE) Calculated using the CKD-EPI Creatinine Equation (2021)    Anion gap 4 (L) 5 - 15    Comment: Performed at [facility], [address] [place], [place] [address]  Comprehensive metabolic panel     Status: Abnormal   Collection Time: 05/24/22  4:53 AM  Result Value Ref Range   Sodium 135 135 - 145 mmol/L   Potassium 4.2 3.5 - 5.1 mmol/L   Chloride 111 98 - 111 mmol/L   CO2 20 (L) 22 - 32 mmol/L   Glucose, Bld 100 (H) 70 - 99 mg/dL    Comment: Glucose reference range applies only to samples taken after fasting for at least 8 hours.   BUN 16 6 - 20 mg/dL   Creatinine, Ser 1.27 (H) 0.61 - 1.24 mg/dL   Calcium 7.8 (L) 8.9 - 10.3 mg/dL   Total Protein 5.6 (L) 6.5 - 8.1 g/dL   Albumin 1.8 (L) 3.5 - 5.0 g/dL   AST 56 (H) 15 - 41 U/L   ALT 32 0 - 44 U/L   Alkaline Phosphatase 159 (H) 38 - 126 U/L   Total Bilirubin 0.3 0.3 - 1.2 mg/dL   GFR, Estimated >60 >60 mL/min    Comment: (NOTE) Calculated using the CKD-EPI Creatinine Equation (2021)    Anion gap 4 (L) 5 - 15    Comment: Performed at [facility], [address], [place], [place] [address]  CBC     Status: Abnormal   Collection Time: 05/24/22  4:53 AM  Result Value Ref Range   WBC 7.6 4.0 - 10.5 K/uL   RBC 2.61 (L) 4.22 - 5.81 MIL/uL   Hemoglobin 8.4 (L) 13.0 - 17.0 g/dL   HCT 25.8 (L) 39.0 - 52.0 %   MCV 98.9 80.0 - 100.0 fL   MCH 32.2 26.0 - 34.0 pg   MCHC 32.6 30.0 - 36.0 g/dL   RDW 14.5 11.5 - 15.5 %   Platelets 212 150 - 400 K/uL   nRBC 0.0 0.0 - 0.2 %    Comment: Performed at [facility], [address] [place], [place] [address]  Magnesium     Status: Abnormal   Collection Time: 05/24/22  4:53 AM  Result Value Ref Range   Magnesium 1.5 (L) 1.7 - 2.4 mg/dL    Comment: Performed at [facility], [address], [place], [place] [address]  Lipase, blood     Status: Abnormal   Collection Time: 05/24/22  4:53 AM  Result Value Ref Range   Lipase 130 (H) 11 - 51 U/L    Comment: Performed at [facility], [address] [place], [place] [address]  Reticulocytes     Status: Abnormal   Collection Time: 05/24/22  4:53 AM  Result Value Ref Range   Retic Ct Pct 1.2 0.4 - 3.1 %   RBC.  2.58 (L) 4.22 - 5.81 MIL/uL   Retic Count, Absolute 31.7 19.0 - 186.0 K/uL   Immature Retic Fract 8.0 2.3 - 15.9 %    Comment: Performed at [facility], [address], [place], [place] [address]   Personally reviewed- gallstones, no cholecystitis/ no thickening or fluid, pancreatitis  MR ABDOMEN MRCP W WO CONTAST  Result Date: 05/23/2022 CLINICAL DATA:  Severe acute pancreatitis and abdominal pain. EXAM: MRI ABDOMEN WITHOUT AND WITH CONTRAST (INCLUDING MRCP) TECHNIQUE: Multiplanar multisequence MR imaging of the abdomen was performed both before and after the administration of intravenous contrast. Heavily T2-weighted images of the biliary and pancreatic ducts were obtained, and three-dimensional MRCP images were rendered by post processing. CONTRAST:  28m GADAVIST GADOBUTROL 1 MMOL/ML IV SOLN COMPARISON:  May 19, 2022 CT and imaging from May 22, 2022.  FINDINGS: Lower chest: Incidental imaging of the lung bases with trace effusions. Hepatobiliary: No focal, suspicious hepatic lesion. Fissural widening of hepatic fissures and mild notching of hepatic margins. No pericholecystic stranding. No wall thickening or hyperenhancement of the gallbladder wall. Sludge and small stones in the dependent gallbladder. No biliary duct dilation or choledocholithiasis. MRCP sequences are mildly motion limited caliber of the common bile duct is approximately 5-6 mm. Portal vein is patent. Mild hepatic and splenic iron deposition is suggested. Pancreas: Signs of acute interstitial edema of the pancreatic-duodenal groove. Edematous changes tract throughout the proximal body and about the celiac. There is no focal pancreatic fluid collection. There is a subtle area in the head of the pancreas on T2 weighted imaging anterior to the common bile duct on image 22/5 this is of intermediate T2 signal a measures 6 mm. There is smooth, tapered appearing narrowing of the distal common bile duct in this area perhaps from edema though with nonspecific features. Also seen on image 21/12. Mild blunting of normal T1 signal in the pancreas with geographic features about the pancreatic head and proximal body. Glandular enhancement is preserved. Spleen:  Normal. Adrenals/Urinary Tract: Marked LEFT renal cortical scarring. No suspicious renal lesion or hydronephrosis. Adrenal glands are normal. Stomach/Bowel: Normal to the extent evaluated. Vascular/Lymphatic:  Patent splenic vein.  No adenopathy. Other:  Trace perihepatic ascites Musculoskeletal: No suspicious bone lesions identified. IMPRESSION: 1. Signs of acute interstitial edema of the pancreatic-duodenal groove and proximal body of the pancreas. No large pancreatic fluid collection. No peripancreatic fluid collection currently with edema tracking throughout tissue planes in the upper abdomen. 2. 6 mm area of increased T2 signal which is mainly  intermediate T2 signal without visible lesion on post-contrast imaging in this area suggests focal inflammation or developing intrapancreatic pseudocyst and is at an area where there is subtle ductal transition of the common bile duct. Perhaps this relates to edema compressing the duct. Would however suggest 6-8 week follow-up after resolution of acute symptoms to assess for any change, exclude underlying lesion and further evaluate this area given motion on current imaging on MRCP sequences. 3. Sludge and small stones in the dependent gallbladder. No biliary duct dilation or choledocholithiasis. 4. Mild hepatic and splenic iron deposition is suggested. Fissural widening of hepatic fissures should be correlated with any clinical or laboratory evidence of liver disease. No overt signs of portal hypertension. 5. Marked LEFT renal cortical scarring. Electronically Signed   By: GZetta BillsM.D.   On: 05/23/2022 19:24   MR 3D Recon At Scanner  Result Date: 05/23/2022 CLINICAL DATA:  Severe acute pancreatitis and abdominal pain. EXAM: MRI ABDOMEN WITHOUT AND WITH  CONTRAST (INCLUDING MRCP) TECHNIQUE: Multiplanar multisequence MR imaging of the abdomen was performed both before and after the administration of intravenous contrast. Heavily T2-weighted images of the biliary and pancreatic ducts were obtained, and three-dimensional MRCP images were rendered by post processing. CONTRAST:  22m GADAVIST GADOBUTROL 1 MMOL/ML IV SOLN COMPARISON:  May 19, 2022 CT and imaging from May 22, 2022. FINDINGS: Lower chest: Incidental imaging of the lung bases with trace effusions. Hepatobiliary: No focal, suspicious hepatic lesion. Fissural widening of hepatic fissures and mild notching of hepatic margins. No pericholecystic stranding. No wall thickening or hyperenhancement of the gallbladder wall. Sludge and small stones in the dependent gallbladder. No biliary duct dilation or choledocholithiasis. MRCP sequences are mildly  motion limited caliber of the common bile duct is approximately 5-6 mm. Portal vein is patent. Mild hepatic and splenic iron deposition is suggested. Pancreas: Signs of acute interstitial edema of the pancreatic-duodenal groove. Edematous changes tract throughout the proximal body and about the celiac. There is no focal pancreatic fluid collection. There is a subtle area in the head of the pancreas on T2 weighted imaging anterior to the common bile duct on image 22/5 this is of intermediate T2 signal a measures 6 mm. There is smooth, tapered appearing narrowing of the distal common bile duct in this area perhaps from edema though with nonspecific features. Also seen on image 21/12. Mild blunting of normal T1 signal in the pancreas with geographic features about the pancreatic head and proximal body. Glandular enhancement is preserved. Spleen:  Normal. Adrenals/Urinary Tract: Marked LEFT renal cortical scarring. No suspicious renal lesion or hydronephrosis. Adrenal glands are normal. Stomach/Bowel: Normal to the extent evaluated. Vascular/Lymphatic:  Patent splenic vein.  No adenopathy. Other:  Trace perihepatic ascites Musculoskeletal: No suspicious bone lesions identified. IMPRESSION: 1. Signs of acute interstitial edema of the pancreatic-duodenal groove and proximal body of the pancreas. No large pancreatic fluid collection. No peripancreatic fluid collection currently with edema tracking throughout tissue planes in the upper abdomen. 2. 6 mm area of increased T2 signal which is mainly intermediate T2 signal without visible lesion on post-contrast imaging in this area suggests focal inflammation or developing intrapancreatic pseudocyst and is at an area where there is subtle ductal transition of the common bile duct. Perhaps this relates to edema compressing the duct. Would however suggest 6-8 week follow-up after resolution of acute symptoms to assess for any change, exclude underlying lesion and further evaluate  this area given motion on current imaging on MRCP sequences. 3. Sludge and small stones in the dependent gallbladder. No biliary duct dilation or choledocholithiasis. 4. Mild hepatic and splenic iron deposition is suggested. Fissural widening of hepatic fissures should be correlated with any clinical or laboratory evidence of liver disease. No overt signs of portal hypertension. 5. Marked LEFT renal cortical scarring. Electronically Signed   By: GZetta BillsM.D.   On: 05/23/2022 19:24     Assessment & Plan:  [name] STADLERis a 57y.o. male with a history of alcoholic cirrhosis and some pancreatitis likely from that but also from gallstone pancreatitis. Discussed with him that he will need a cholecystectomy but that we recommend he get this done at a tertiary care center. He already has known gallbladder polyp that is growing based on other documentation in December.   Will make sure my office knows [facility] Discussed that if he has any issues to return to the hospital and he  should get transported down.   All questions were answered to the satisfaction of the patient and family.  Curlene Labrum, MD [facility] [address] [place], [place] [id number] [phone] (office)

## 2022-05-24 NOTE — Discharge Summary (Signed)
Physician Discharge Summary  BEATRIZ SETTLES JJH:[id number] DOB: [birth year] DOA: 05/19/2022  PCP: Carrolyn Meiers, MD  Admit date: 05/19/2022  Discharge date: 05/24/2022  Admitted From:Home  Disposition:  Home  Recommendations for Outpatient Follow-up:  Follow up with PCP in 1-2 weeks Follow-up with [facility]/hepatology clinic with referral sent regarding need for cholecystectomy in the near future (patient is too complex to have procedure locally given his alcoholic cirrhosis).  Clinic will contact patient for appointment. Continue other home medications as prior Encouraged patient to avoid alcohol use  Home Health: None  Equipment/Devices: None  Discharge Condition:Stable  CODE STATUS: Full  Diet recommendation: Heart Healthy  Brief/Interim Summary: [name] [name] is a 57 y.o. male with medical history significant of asthma, B12 deficiency, alcoholic cirrhosis, prior paracentesis with 6 L of fluid removed in 2023, former alcohol abuser, hyperlipidemia, GERD, hypertension, and nephrolithiasis, and more, who presents to [facility] with a chief complaint of back pain and chest pain.  Patient reports that the pain started 5 days ago.  Pointing to his right upper quadrant, radiating to his back.  Patient was admitted with suspicion of cholecystitis along with cholelithiasis and subsequently developed acute pancreatitis with elevation in lipase levels.  Per GI, this appears to be related to cholelithiasis and patient will need to have cholecystectomy in the near future, however is too complex to have the procedure done locally due to his history of alcoholic hepatitis.  They have recommended referral to tertiary care center as noted above for further evaluation and treatment.  General surgery consulted as well with no need for urgent intraoperative intervention at this time.  He was also noted to have Aerococcus UTI which was treated with Rocephin for 3 days.  He no longer needs any further  antibiotics and is in stable condition for discharge with no further abdominal pain and is tolerating diet.  Discharge Diagnoses:  Principal Problem:   AKI (acute kidney injury) ([place]) Active Problems:   Alcoholic cirrhosis of liver with ascites ([place])   UTI (urinary tract infection)   Hyponatremia   Cholelithiasis   Tobacco use disorder   GERD (gastroesophageal reflux disease)   Alcohol-induced acute pancreatitis  Principal discharge diagnosis: Acute pancreatitis in the setting of cholelithiasis as well as alcohol use with associated AKI.  Aerococcus UTI.  Discharge Instructions  Discharge Instructions     Ambulatory referral to Gastroenterology   Complete by: As directed    Evaluation for cholecystectomy   What is the reason for referral?: Other   Diet - low sodium heart healthy   Complete by: As directed    Increase activity slowly   Complete by: As directed       Allergies as of 05/24/2022   No Known Allergies      Medication List     TAKE these medications    acetaminophen 500 MG tablet Commonly known as: TYLENOL Take 2,000 mg by mouth every 6 (six) hours as needed for moderate pain.   albuterol 108 (90 Base) MCG/ACT inhaler Commonly known as: Proventil HFA INHALE 2 PUFFS BY MOUTH EVERY 6 HOURS AS NEEDED FOR COUGHING, WHEEZING, OR SHORTNESS OF BREATH   aspirin 81 MG chewable tablet Chew 81 mg by mouth daily.   B-12 COMPLIANCE INJECTION IJ Inject 1 Dose as directed every 30 (thirty) days.   Dulera 200-5 MCG/ACT Aero Generic drug: mometasone-formoterol Inhale 2 puffs into the lungs 2 (two) times daily.   folic acid 818 MCG tablet Commonly known as: [employer]  Take 1 tablet (400 mcg total) by mouth daily.   furosemide 20 MG tablet Commonly known as: LASIX Take 2 tablets in morning and 1 tablet in the afternoon. (40 milligrams in morning and 20 milligrams in afternoon).   metoprolol tartrate 25 MG tablet Commonly known as: LOPRESSOR Take 25 mg by mouth  2 (two) times daily.   Muscle Rub 10-15 % Crea Apply 1 Application topically as needed for muscle pain.   ondansetron 4 MG disintegrating tablet Commonly known as: ZOFRAN-ODT 4 mg every 8 (eight) hours as needed.   pantoprazole 40 MG tablet Commonly known as: Protonix Take 1 tablet (40 mg total) by mouth daily.   sodium bicarbonate 650 MG tablet Take 650 mg by mouth 2 (two) times daily.   spironolactone 50 MG tablet Commonly known as: ALDACTONE Take 50-100 mg by mouth See admin instructions. Take 100 mg by mouth in morning and 50 mg in the afternoon               Durable Medical Equipment  (From admission, onward)           Start     Ordered   05/22/22 0910  For home use only DME Cane  Once       Comments: Ambulatory dysfunction   05/22/22 0909            Follow-up Information     Services, [facility]. Schedule an appointment as soon as possible for a visit.   Why: Call to schedule counseling for substance abuse. Contact information: [place] [address] [phone]         [email or web address] Call.   Why: Call or go to walk in clinic for substance abuse. Contact information: [phone] [email or web address]        Carrolyn Meiers, MD. Schedule an appointment as soon as possible for a visit in 1 week(s).   Specialty: Internal Medicine Contact information: [place] [address] [address]. Schedule an appointment as soon as possible for a visit.   Contact information: [address] [address] [phone]                No Known Allergies  Consultations: GI General surgery   Procedures/Studies: MR ABDOMEN MRCP W WO CONTAST  Result Date: 05/23/2022 CLINICAL DATA:  Severe acute pancreatitis and abdominal pain. EXAM: MRI ABDOMEN WITHOUT AND WITH CONTRAST (INCLUDING MRCP) TECHNIQUE: Multiplanar  multisequence MR imaging of the abdomen was performed both before and after the administration of intravenous contrast. Heavily T2-weighted images of the biliary and pancreatic ducts were obtained, and three-dimensional MRCP images were rendered by post processing. CONTRAST:  54m GADAVIST GADOBUTROL 1 MMOL/ML IV SOLN COMPARISON:  May 19, 2022 CT and imaging from May 22, 2022. FINDINGS: Lower chest: Incidental imaging of the lung bases with trace effusions. Hepatobiliary: No focal, suspicious hepatic lesion. Fissural widening of hepatic fissures and mild notching of hepatic margins. No pericholecystic stranding. No wall thickening or hyperenhancement of the gallbladder wall. Sludge and small stones in the dependent gallbladder. No biliary duct dilation or choledocholithiasis. MRCP sequences are mildly motion limited caliber of the common bile duct is approximately 5-6 mm. Portal vein is patent. Mild hepatic and splenic iron deposition is suggested. Pancreas: Signs of acute interstitial edema of the pancreatic-duodenal groove. Edematous changes tract throughout the proximal body and about the celiac. There is no focal  pancreatic fluid collection. There is a subtle area in the head of the pancreas on T2 weighted imaging anterior to the common bile duct on image 22/5 this is of intermediate T2 signal a measures 6 mm. There is smooth, tapered appearing narrowing of the distal common bile duct in this area perhaps from edema though with nonspecific features. Also seen on image 21/12. Mild blunting of normal T1 signal in the pancreas with geographic features about the pancreatic head and proximal body. Glandular enhancement is preserved. Spleen:  Normal. Adrenals/Urinary Tract: Marked LEFT renal cortical scarring. No suspicious renal lesion or hydronephrosis. Adrenal glands are normal. Stomach/Bowel: Normal to the extent evaluated. Vascular/Lymphatic:  Patent splenic vein.  No adenopathy. Other:  Trace perihepatic  ascites Musculoskeletal: No suspicious bone lesions identified. IMPRESSION: 1. Signs of acute interstitial edema of the pancreatic-duodenal groove and proximal body of the pancreas. No large pancreatic fluid collection. No peripancreatic fluid collection currently with edema tracking throughout tissue planes in the upper abdomen. 2. 6 mm area of increased T2 signal which is mainly intermediate T2 signal without visible lesion on post-contrast imaging in this area suggests focal inflammation or developing intrapancreatic pseudocyst and is at an area where there is subtle ductal transition of the common bile duct. Perhaps this relates to edema compressing the duct. Would however suggest 6-8 week follow-up after resolution of acute symptoms to assess for any change, exclude underlying lesion and further evaluate this area given motion on current imaging on MRCP sequences. 3. Sludge and small stones in the dependent gallbladder. No biliary duct dilation or choledocholithiasis. 4. Mild hepatic and splenic iron deposition is suggested. Fissural widening of hepatic fissures should be correlated with any clinical or laboratory evidence of liver disease. No overt signs of portal hypertension. 5. Marked LEFT renal cortical scarring. Electronically Signed   By: Zetta Bills M.D.   On: 05/23/2022 19:24   MR 3D Recon At Scanner  Result Date: 05/23/2022 CLINICAL DATA:  Severe acute pancreatitis and abdominal pain. EXAM: MRI ABDOMEN WITHOUT AND WITH CONTRAST (INCLUDING MRCP) TECHNIQUE: Multiplanar multisequence MR imaging of the abdomen was performed both before and after the administration of intravenous contrast. Heavily T2-weighted images of the biliary and pancreatic ducts were obtained, and three-dimensional MRCP images were rendered by post processing. CONTRAST:  40m GADAVIST GADOBUTROL 1 MMOL/ML IV SOLN COMPARISON:  May 19, 2022 CT and imaging from May 22, 2022. FINDINGS: Lower chest: Incidental imaging of the  lung bases with trace effusions. Hepatobiliary: No focal, suspicious hepatic lesion. Fissural widening of hepatic fissures and mild notching of hepatic margins. No pericholecystic stranding. No wall thickening or hyperenhancement of the gallbladder wall. Sludge and small stones in the dependent gallbladder. No biliary duct dilation or choledocholithiasis. MRCP sequences are mildly motion limited caliber of the common bile duct is approximately 5-6 mm. Portal vein is patent. Mild hepatic and splenic iron deposition is suggested. Pancreas: Signs of acute interstitial edema of the pancreatic-duodenal groove. Edematous changes tract throughout the proximal body and about the celiac. There is no focal pancreatic fluid collection. There is a subtle area in the head of the pancreas on T2 weighted imaging anterior to the common bile duct on image 22/5 this is of intermediate T2 signal a measures 6 mm. There is smooth, tapered appearing narrowing of the distal common bile duct in this area perhaps from edema though with nonspecific features. Also seen on image 21/12. Mild blunting of normal T1 signal in the pancreas with geographic features about the pancreatic  head and proximal body. Glandular enhancement is preserved. Spleen:  Normal. Adrenals/Urinary Tract: Marked LEFT renal cortical scarring. No suspicious renal lesion or hydronephrosis. Adrenal glands are normal. Stomach/Bowel: Normal to the extent evaluated. Vascular/Lymphatic:  Patent splenic vein.  No adenopathy. Other:  Trace perihepatic ascites Musculoskeletal: No suspicious bone lesions identified. IMPRESSION: 1. Signs of acute interstitial edema of the pancreatic-duodenal groove and proximal body of the pancreas. No large pancreatic fluid collection. No peripancreatic fluid collection currently with edema tracking throughout tissue planes in the upper abdomen. 2. 6 mm area of increased T2 signal which is mainly intermediate T2 signal without visible lesion on  post-contrast imaging in this area suggests focal inflammation or developing intrapancreatic pseudocyst and is at an area where there is subtle ductal transition of the common bile duct. Perhaps this relates to edema compressing the duct. Would however suggest 6-8 week follow-up after resolution of acute symptoms to assess for any change, exclude underlying lesion and further evaluate this area given motion on current imaging on MRCP sequences. 3. Sludge and small stones in the dependent gallbladder. No biliary duct dilation or choledocholithiasis. 4. Mild hepatic and splenic iron deposition is suggested. Fissural widening of hepatic fissures should be correlated with any clinical or laboratory evidence of liver disease. No overt signs of portal hypertension. 5. Marked LEFT renal cortical scarring. Electronically Signed   By: Zetta Bills M.D.   On: 05/23/2022 19:24   US Abdomen Limited RUQ (LIVER/GB)  Result Date: 05/22/2022 CLINICAL DATA:  Right upper quadrant abdominal pain EXAM: ULTRASOUND ABDOMEN LIMITED RIGHT UPPER QUADRANT COMPARISON:  CT scan 05/19/2022 FINDINGS: Gallbladder: Poorly shadowing gallstones are present in the gallbladder and appear mobile, largest 1.5 cm. Sonographic Murphy's sign absent. Mild gallbladder wall thickening at 0.3 cm. Trace perihepatic ascites including in the gallbladder fossa. Common bile duct: Diameter: 0.5 cm, within normal limits Liver: Nodular contour with mild nonfocal internal heterogeneity and mildly accentuated echogenicity. Portal vein is patent on color Doppler imaging with normal direction of blood flow towards the liver. Other: None. IMPRESSION: 1. Cholelithiasis with mild gallbladder wall thickening, which could be from the patient's hypoproteinemia/hypoalbuminemia or inflammation. Sonographic Murphy's sign absent. Trace perihepatic ascites some of which is adjacent to the gallbladder. Correlate clinically in assessing for cholecystitis. 2. Nodular contour of  the liver with mild nonfocal internal heterogeneity and mildly accentuated echogenicity. Findings are nonspecific but could reflect underlying cirrhosis. 3. Trace perihepatic ascites. Electronically Signed   By: Van Clines M.D.   On: 05/22/2022 08:56   CT Renal Stone Study  Result Date: 05/19/2022 CLINICAL DATA:  Right abdominal and flank pain with stone suspected. No appetite for a week. Low back pain. EXAM: CT ABDOMEN AND PELVIS WITHOUT CONTRAST TECHNIQUE: Multidetector CT imaging of the abdomen and pelvis was performed following the standard protocol without IV contrast. RADIATION DOSE REDUCTION: This exam was performed according to the departmental dose-optimization program which includes automated exposure control, adjustment of the mA and/or kV according to patient size and/or use of iterative reconstruction technique. COMPARISON:  MRI abdomen 06/29/2021. CT abdomen and pelvis 05/27/2021 FINDINGS: Lower chest: Lung bases are clear. Hepatobiliary: Enlargement of the lateral segment left lobe of liver and nodular liver contour suggests cirrhosis. No focal lesions are demonstrated. Small stones in the gallbladder. No wall thickening or inflammatory changes. No bile duct dilatation. Pancreas: The head of the pancreas appears prominent. This could be due to noncontrast imaging with partial voluming to adjacent structures but it appears more prominent than on the  previous study. Given the history of loss of appetite, contrast-enhanced imaging is recommended to exclude a pancreatic mass or focal pancreatitis. Consider follow-up with contrast-enhanced CT or MRI. No pancreatic ductal dilatation. Spleen: Normal in size without focal abnormality. Adrenals/Urinary Tract: No adrenal gland nodules. Left renal atrophy. Left intrarenal stones measuring up to 2-3 mm. No hydronephrosis or hydroureter. There is mild right hydronephrosis and hydroureter down to the level of the bladder. No discrete stones are  demonstrated. This could represent sequela of recently passed stone, reflux, or occult non radiopaque stone. Bladder is normal. Stomach/Bowel: Stomach, small bowel, and colon are not abnormally distended. No wall thickening or inflammatory changes. Scattered colonic diverticula without evidence of acute diverticulitis. Appendix is not visualized. Vascular/Lymphatic: Aortic atherosclerosis. No enlarged abdominal or pelvic lymph nodes. Reproductive: Prostate is unremarkable. Other: Interval resolution of previous ascites. No free air or free fluid is identified today. Abdominal wall musculature appears intact. Musculoskeletal: Acute appearing fractures of the left posterior tenth and eleventh ribs. IMPRESSION: 1. Changes of hepatic cirrhosis. 2. Cholelithiasis without evidence of acute cholecystitis. 3. Nonspecific prominent appearance of the head of the pancreas, possibly artifactual but could indicate pancreatic mass or focal pancreatitis. Consider follow-up with contrast-enhanced CT or MRI. 4. Nonobstructing intrarenal stones on the left. 5. Right hydronephrosis and hydroureter. No right ureteral stones are identified. Changes could indicate recently passed stone, reflux disease, or occult non radiopaque stone. 6. Aortic atherosclerosis. 7. Acute fractures of the left posterior tenth and eleventh ribs. Electronically Signed   By: Lucienne Capers M.D.   On: 05/19/2022 20:18   DG Chest 2 View  Result Date: 05/19/2022 CLINICAL DATA:  Chest pain. Right-sided chest pain and low back pain. EXAM: CHEST - 2 VIEW COMPARISON:  05/30/2021. FINDINGS: Clear lungs. Normal heart size and mediastinal contours. No pleural effusion or pneumothorax. Visualized bones and upper abdomen are unremarkable. IMPRESSION: No acute cardiopulmonary disease. Electronically Signed   By: Emmit Alexanders M.D.   On: 05/19/2022 16:06     Discharge Exam: Vitals:   05/24/22 0500 05/24/22 0708  BP: 128/76   Pulse: 71   Resp: 20   Temp:  98.6 F (37 C)   SpO2: 100% 100%   Vitals:   05/23/22 1933 05/23/22 2056 05/24/22 0500 05/24/22 0708  BP:  113/73 128/76   Pulse:  77 71   Resp:  19 20   Temp:  98.2 F (36.8 C) 98.6 F (37 C)   TempSrc:  Oral Oral   SpO2: 98% 100% 100% 100%  Weight:      Height:        General: Pt is alert, awake, not in acute distress Cardiovascular: RRR, S1/S2 +, no rubs, no gallops Respiratory: CTA bilaterally, no wheezing, no rhonchi Abdominal: Soft, NT, ND, bowel sounds + Extremities: no edema, no cyanosis    The results of significant diagnostics from this hospitalization (including imaging, microbiology, ancillary and laboratory) are listed below for reference.     Microbiology: Recent Results (from the past 240 hour(s))  Resp panel by RT-PCR (RSV, Flu A&B, Covid) Anterior Nasal Swab     Status: None   Collection Time: 05/19/22  6:25 PM   Specimen: Anterior Nasal Swab  Result Value Ref Range Status   SARS Coronavirus 2 by RT PCR NEGATIVE NEGATIVE Final    Comment: (NOTE) SARS-CoV-2 target nucleic acids are NOT DETECTED.  The SARS-CoV-2 RNA is generally detectable in upper respiratory specimens during the acute phase of infection. The lowest concentration of SARS-CoV-2  viral copies this assay can detect is 138 copies/mL. A negative result does not preclude SARS-Cov-2 infection and should not be used as the sole basis for treatment or other patient management decisions. A negative result may occur with  improper specimen collection/handling, submission of specimen other than nasopharyngeal swab, presence of viral mutation(s) within the areas targeted by this assay, and inadequate number of viral copies(<138 copies/mL). A negative result must be combined with clinical observations, patient history, and epidemiological information. The expected result is Negative.  Fact Sheet for Patients:  EntrepreneurPulse.com.au  Fact Sheet for Healthcare Providers:   IncredibleEmployment.be  This test is no t yet approved or cleared by the [place] FDA and  has been authorized for detection and/or diagnosis of SARS-CoV-2 by FDA under an Emergency Use Authorization (EUA). This EUA will remain  in effect (meaning this test can be used) for the duration of the COVID-19 declaration under Section 564(b)(1) of the Act, 21 U.S.C.section 360bbb-3(b)(1), unless the authorization is terminated  or revoked sooner.       Influenza A by PCR NEGATIVE NEGATIVE Final   Influenza B by PCR NEGATIVE NEGATIVE Final    Comment: (NOTE) The Xpert Xpress SARS-CoV-2/FLU/RSV plus assay is intended as an aid in the diagnosis of influenza from Nasopharyngeal swab specimens and should not be used as a sole basis for treatment. Nasal washings and aspirates are unacceptable for Xpert Xpress SARS-CoV-2/FLU/RSV testing.  Fact Sheet for Patients: EntrepreneurPulse.com.au  Fact Sheet for Healthcare Providers: IncredibleEmployment.be  This test is not yet approved or cleared by the [place] FDA and has been authorized for detection and/or diagnosis of SARS-CoV-2 by FDA under an Emergency Use Authorization (EUA). This EUA will remain in effect (meaning this test can be used) for the duration of the COVID-19 declaration under Section 564(b)(1) of the Act, 21 U.S.C. section 360bbb-3(b)(1), unless the authorization is terminated or revoked.     Resp Syncytial Virus by PCR NEGATIVE NEGATIVE Final    Comment: (NOTE) Fact Sheet for Patients: EntrepreneurPulse.com.au  Fact Sheet for Healthcare Providers: IncredibleEmployment.be  This test is not yet approved or cleared by the [place] FDA and has been authorized for detection and/or diagnosis of SARS-CoV-2 by FDA under an Emergency Use Authorization (EUA). This EUA will remain in effect (meaning this test can be used) for  the duration of the COVID-19 declaration under Section 564(b)(1) of the Act, 21 U.S.C. section 360bbb-3(b)(1), unless the authorization is terminated or revoked.  Performed at [facility], [address] [place], [place] [address]   Urine Culture     Status: Abnormal   Collection Time: 05/19/22  6:30 PM   Specimen: Urine, Clean Catch  Result Value Ref Range Status   Specimen Description   Final    URINE, CLEAN CATCH Performed at [facility], [address], [place], [place] [address]    Special Requests   Final    NONE Performed at [facility], [address] [place], [place] [address]    Culture (A)  Final    >=100,000 COLONIES/mL AEROCOCCUS URINAE Standardized susceptibility testing for this organism is not available. Performed at [facility], [place] [address] [place], [place] [address]    Report Status 05/23/2022 FINAL  Final     Labs: BNP (last 3 results) No results for input(s): "BNP" in the last 8760 hours. Basic Metabolic Panel: Recent Labs  Lab 05/20/22 0603 05/20/22 0604 05/20/22 2148 05/21/22 2979 05/22/22 0547 05/23/22 0439 05/24/22 0453  NA 123*   < >  125* 128* 130* 133* 135  K 5.0   < > 4.3 4.4 4.4 4.5 4.2  CL 98   < > 99 102 106 110 111  CO2 18*   < > 18* 19* 20* 19* 20*  GLUCOSE 97   < > 151* 139* 116* 88 100*  BUN 60*   < > 58* 52* 35* 24* 16  CREATININE 2.60*   < > 2.39* 2.14* 1.96* 1.55* 1.27*  CALCIUM 7.7*   < > 7.8* 8.1* 7.6* 7.8* 7.8*  MG 1.6*  --   --  1.8  --   --  1.5*  PHOS  --   --   --  2.7  --   --   --    < > = values in this interval not displayed.   Liver Function Tests: Recent Labs  Lab 05/20/22 0603 05/21/22 0613 05/22/22 0547 05/23/22 0439 05/24/22 0453  AST 44* 95* 126* 70* 56*  ALT 17 35 47* 37 32  ALKPHOS 190* 261* 225* 183* 159*  BILITOT 0.6 0.6 0.6 0.3 0.3  PROT 7.0 6.8 6.3* 5.9* 5.6*  ALBUMIN 2.3* 2.3* 2.1* 1.9* 1.8*   Recent Labs  Lab 05/21/22 1519 05/22/22 0943 05/24/22 0453  LIPASE 182* 2,369*  130*   No results for input(s): "AMMONIA" in the last 168 hours. CBC: Recent Labs  Lab 05/20/22 0603 05/21/22 0613 05/22/22 0547 05/23/22 0439 05/24/22 0453  WBC 8.0 7.6 7.2 9.0 7.6  NEUTROABS 5.2  --   --   --   --   HGB 12.0* 11.2* 9.4* 9.1* 8.4*  HCT 35.5* 33.2* 28.1* 28.3* 25.8*  MCV 95.2 96.5 98.6 100.4* 98.9  PLT 259 241 214 205 212   Cardiac Enzymes: No results for input(s): "CKTOTAL", "CKMB", "CKMBINDEX", "TROPONINI" in the last 168 hours. BNP: Invalid input(s): "POCBNP" CBG: No results for input(s): "GLUCAP" in the last 168 hours. D-Dimer No results for input(s): "DDIMER" in the last 72 hours. Hgb A1c No results for input(s): "HGBA1C" in the last 72 hours. Lipid Profile No results for input(s): "CHOL", "HDL", "LDLCALC", "TRIG", "CHOLHDL", "LDLDIRECT" in the last 72 hours. Thyroid function studies No results for input(s): "TSH", "T4TOTAL", "T3FREE", "THYROIDAB" in the last 72 hours.  Invalid input(s): "FREET3" Anemia work up Recent Labs    05/22/22 0546 05/22/22 0943 05/24/22 0453  VITAMINB12  --  1,216*  --   FOLATE 8.2  --   --   FERRITIN  --  1,477*  --   TIBC  --  172*  --   IRON  --  133  --   RETICCTPCT  --   --  1.2   Urinalysis    Component Value Date/Time   COLORURINE YELLOW 05/19/2022 1830   APPEARANCEUR HAZY (A) 05/19/2022 1830   LABSPEC 1.008 05/19/2022 1830   PHURINE 7.0 05/19/2022 1830   GLUCOSEU NEGATIVE 05/19/2022 1830   HGBUR SMALL (A) 05/19/2022 1830   BILIRUBINUR NEGATIVE 05/19/2022 1830   KETONESUR NEGATIVE 05/19/2022 1830   PROTEINUR 100 (A) 05/19/2022 1830   UROBILINOGEN 0.2 11/02/2013 0816   NITRITE NEGATIVE 05/19/2022 1830   LEUKOCYTESUR LARGE (A) 05/19/2022 1830   Sepsis Labs Recent Labs  Lab 05/21/22 0613 05/22/22 0547 05/23/22 0439 05/24/22 0453  WBC 7.6 7.2 9.0 7.6   Microbiology Recent Results (from the past 240 hour(s))  Resp panel by RT-PCR (RSV, Flu A&B, Covid) Anterior Nasal Swab     Status: None    Collection Time: 05/19/22  6:25 PM  Specimen: Anterior Nasal Swab  Result Value Ref Range Status   SARS Coronavirus 2 by RT PCR NEGATIVE NEGATIVE Final    Comment: (NOTE) SARS-CoV-2 target nucleic acids are NOT DETECTED.  The SARS-CoV-2 RNA is generally detectable in upper respiratory specimens during the acute phase of infection. The lowest concentration of SARS-CoV-2 viral copies this assay can detect is 138 copies/mL. A negative result does not preclude SARS-Cov-2 infection and should not be used as the sole basis for treatment or other patient management decisions. A negative result may occur with  improper specimen collection/handling, submission of specimen other than nasopharyngeal swab, presence of viral mutation(s) within the areas targeted by this assay, and inadequate number of viral copies(<138 copies/mL). A negative result must be combined with clinical observations, patient history, and epidemiological information. The expected result is Negative.  Fact Sheet for Patients:  EntrepreneurPulse.com.au  Fact Sheet for Healthcare Providers:  IncredibleEmployment.be  This test is no t yet approved or cleared by the [place] FDA and  has been authorized for detection and/or diagnosis of SARS-CoV-2 by FDA under an Emergency Use Authorization (EUA). This EUA will remain  in effect (meaning this test can be used) for the duration of the COVID-19 declaration under Section 564(b)(1) of the Act, 21 U.S.C.section 360bbb-3(b)(1), unless the authorization is terminated  or revoked sooner.       Influenza A by PCR NEGATIVE NEGATIVE Final   Influenza B by PCR NEGATIVE NEGATIVE Final    Comment: (NOTE) The Xpert Xpress SARS-CoV-2/FLU/RSV plus assay is intended as an aid in the diagnosis of influenza from Nasopharyngeal swab specimens and should not be used as a sole basis for treatment. Nasal washings and aspirates are unacceptable for  Xpert Xpress SARS-CoV-2/FLU/RSV testing.  Fact Sheet for Patients: EntrepreneurPulse.com.au  Fact Sheet for Healthcare Providers: IncredibleEmployment.be  This test is not yet approved or cleared by the [place] FDA and has been authorized for detection and/or diagnosis of SARS-CoV-2 by FDA under an Emergency Use Authorization (EUA). This EUA will remain in effect (meaning this test can be used) for the duration of the COVID-19 declaration under Section 564(b)(1) of the Act, 21 U.S.C. section 360bbb-3(b)(1), unless the authorization is terminated or revoked.     Resp Syncytial Virus by PCR NEGATIVE NEGATIVE Final    Comment: (NOTE) Fact Sheet for Patients: EntrepreneurPulse.com.au  Fact Sheet for Healthcare Providers: IncredibleEmployment.be  This test is not yet approved or cleared by the [place] FDA and has been authorized for detection and/or diagnosis of SARS-CoV-2 by FDA under an Emergency Use Authorization (EUA). This EUA will remain in effect (meaning this test can be used) for the duration of the COVID-19 declaration under Section 564(b)(1) of the Act, 21 U.S.C. section 360bbb-3(b)(1), unless the authorization is terminated or revoked.  Performed at [facility], [address], [place], [place] [address]   Urine Culture     Status: Abnormal   Collection Time: 05/19/22  6:30 PM   Specimen: Urine, Clean Catch  Result Value Ref Range Status   Specimen Description   Final    URINE, CLEAN CATCH Performed at [facility], [address], [place], [place] [address]    Special Requests   Final    NONE Performed at [facility], [address], [place], [place] [address]    Culture (A)  Final    >=100,000 COLONIES/mL AEROCOCCUS URINAE Standardized susceptibility testing for this organism is not available. Performed at [facility], [place],  [place] [address]     Report Status 05/23/2022 FINAL  Final     Time coordinating discharge: 35 minutes  SIGNED:   Rodena Goldmann, DO [facility] 05/24/2022, 10:09 AM  If 7PM-7AM, please contact night-coverage [email or web address]

## 2022-06-02 ENCOUNTER — Inpatient Hospital Stay: Payer: Medicaid Other | Attending: Physician Assistant

## 2022-06-02 VITALS — BP 124/74 | HR 83 | Temp 98.4°F | Resp 18

## 2022-06-02 DIAGNOSIS — E538 Deficiency of other specified B group vitamins: Secondary | ICD-10-CM | POA: Diagnosis present

## 2022-06-02 MED ORDER — CYANOCOBALAMIN 1000 MCG/ML IJ SOLN
1000.0000 ug | Freq: Once | INTRAMUSCULAR | Status: AC
  Administered 2022-06-02: 1000 ug via INTRAMUSCULAR
  Filled 2022-06-02: qty 1

## 2022-06-02 NOTE — Progress Notes (Signed)
Patient in clinic today for B-12 injection. See MAR for injection information. Patient remained stable throughout injection. Patient discharged from clinic ambulatory and in stable condition.

## 2022-06-02 NOTE — Patient Instructions (Signed)
[facility]  Discharge Instructions: Thank you for choosing [facility] to provide your oncology and hematology care.  If you have a lab appointment with the Cancer Center, please come in thru the Main Entrance and check in at the main information desk.  Wear comfortable clothing and clothing appropriate for easy access to any Portacath or PICC line.   We strive to give you quality time with your provider. You may need to reschedule your appointment if you arrive late (15 or more minutes).  Arriving late affects you and other patients whose appointments are after yours.  Also, if you miss three or more appointments without notifying the office, you may be dismissed from the clinic at the provider's discretion.      For prescription refill requests, have your pharmacy contact our office and allow 72 hours for refills to be completed.    Today you received your B-12 injection    To help prevent nausea and vomiting after your treatment, we encourage you to take your nausea medication as directed.  BELOW ARE SYMPTOMS THAT SHOULD BE REPORTED IMMEDIATELY: *FEVER GREATER THAN 100.4 F (38 C) OR HIGHER *CHILLS OR SWEATING *NAUSEA AND VOMITING THAT IS NOT CONTROLLED WITH YOUR NAUSEA MEDICATION *UNUSUAL SHORTNESS OF BREATH *UNUSUAL BRUISING OR BLEEDING *URINARY PROBLEMS (pain or burning when urinating, or frequent urination) *BOWEL PROBLEMS (unusual diarrhea, constipation, pain near the anus) TENDERNESS IN MOUTH AND THROAT WITH OR WITHOUT PRESENCE OF ULCERS (sore throat, sores in mouth, or a toothache) UNUSUAL RASH, SWELLING OR PAIN  UNUSUAL VAGINAL DISCHARGE OR ITCHING   Items with * indicate a potential emergency and should be followed up as soon as possible or go to the Emergency Department if any problems should occur.  Please show the CHEMOTHERAPY ALERT CARD or IMMUNOTHERAPY ALERT CARD at check-in to the Emergency Department and triage nurse.  Should you  have questions after your visit or need to cancel or reschedule your appointment, please contact [facility] [phone]  and follow the prompts.  Office hours are 8:00 a.m. to 4:30 p.m. Monday - Friday. Please note that voicemails left after 4:00 p.m. may not be returned until the following business day.  We are closed weekends and major holidays. You have access to a nurse at all times for urgent questions. Please call the main number to the clinic [phone] and follow the prompts.  For any non-urgent questions, you may also contact your provider using MyChart. We now offer e-Visits for anyone 18 and older to request care online for non-urgent symptoms. For details visit [email or web address].   Also download the MyChart app! Go to the app store, search "MyChart", open the app, select [place], and log in with your MyChart username and password.   

## 2022-06-14 ENCOUNTER — Ambulatory Visit (INDEPENDENT_AMBULATORY_CARE_PROVIDER_SITE_OTHER): Payer: Medicaid Other | Admitting: Gastroenterology

## 2022-06-14 ENCOUNTER — Encounter: Payer: Self-pay | Admitting: Gastroenterology

## 2022-06-14 VITALS — BP 143/81 | HR 72 | Temp 98.1°F | Ht 72.0 in | Wt 158.7 lb

## 2022-06-14 DIAGNOSIS — K852 Alcohol induced acute pancreatitis without necrosis or infection: Secondary | ICD-10-CM

## 2022-06-14 DIAGNOSIS — K7030 Alcoholic cirrhosis of liver without ascites: Secondary | ICD-10-CM

## 2022-06-14 NOTE — Patient Instructions (Signed)
We will see you in 3 months!  We will be contacting you to arrange an MRI of your pancreas in about 6 weeks.   Continue to stay completely away from any alcohol.  Have a good Valentine's Day!  I enjoyed seeing you again today! At our first visit, I mentioned how I value our relationship and want to provide genuine, compassionate, and quality care. You may receive a survey regarding your visit with me, and I welcome your feedback! Thanks so much for taking the time to complete this. I look forward to seeing you again.   Annitta Needs, PhD, ANP-BC [facility]

## 2022-06-14 NOTE — Progress Notes (Signed)
Gastroenterology Office Note     Primary Care Physician:  Carrolyn Meiers, MD  Primary Gastroenterologist: Dr. Gala Romney    Chief Complaint   Chief Complaint  Patient presents with   Cirrhosis    Follow up on cirrhosis. Reports doing well and no concerns today.      History of Present Illness   [name] [name] is a 57 y.o. male presenting today in follow-up with a history of cirrhosis due to ETOH, followed by Liver clinic in [place] as well Roosevelt Locks, FNP). Recent EGD May 2023 with normal esophagus but retained food in stomach and duodenum precluded exam.   Now returning in follow-up after hospitalization for pancreatitis which was multifactorial in setting of alcohol use and gallstones. He has a known gallbladder polyp that is being followed serially. He has been referred by Roosevelt Locks, FNP, to [facility] for evaluation for cholecystectomy.   Needs repeat MRI in about 6 weeks. No abdominal pain, N/V, changes in bowel habits, constipation, diarrhea, overt GI bleeding,  dysphagia, unexplained weight loss, lack of appetite, unexplained weight gain, mental status changes, confusion, pruritus, icterus.  He is on Lasix 40 mg in am and 20 mg in pm, spironolactone 100 mg in am and 50 mg in pm.   AFP Jan 2024 outside labs normal. He has not had alcohol since early January.    Colonoscopy Dec 2023: pancolonic diverticulosis, multiple polyps, one which was 11 mm (tubular adenomas). Surveillance in 3 years.    Past Medical History:  Diagnosis Date   Asthma    B12 deficiency 03/15/2022   Cirrhosis ([place])    Dyspnea    High cholesterol    History of kidney stones    Hypertension    Kidney stones     Past Surgical History:  Procedure Laterality Date   APPENDECTOMY     BIOPSY  07/22/2020   Procedure: BIOPSY;  Surgeon: Daneil Dolin, MD;  [facility];  Service: Endoscopy;;   COLONOSCOPY WITH PROPOFOL N/A 04/18/2017   non-bleeding internal  hemorrhoids, two 4-6 mm polyps in descending colon and cecum, pancolonic diverticulosis, single cecal AVM. Tubular adenomas, surveillance in 2023.    COLONOSCOPY WITH PROPOFOL N/A 04/13/2022   Procedure: COLONOSCOPY WITH PROPOFOL;  Surgeon: Daneil Dolin, MD;  [facility];  Service: Endoscopy;  Laterality: N/A;  9:00 AM   ESOPHAGOGASTRODUODENOSCOPY (EGD) WITH PROPOFOL N/A 07/22/2020   normal esophagus, small hiatal hernia, abnormal gastric mucosa, abnormal appearing ampula and periampullary mucosa. Mild chronic gastritis.   ESOPHAGOGASTRODUODENOSCOPY (EGD) WITH PROPOFOL N/A 09/08/2021   normal esophagus, retained food in stomach and duodenum precluded complete examination.   FRACTURE SURGERY     left arm   IR PARACENTESIS  05/31/2021   LOWER EXTREMITY VENOGRAPHY N/A 08/10/2020   Procedure: LOWER EXTREMITY VENOGRAPHY;  Surgeon: Waynetta Sandy, MD;  Location: [place] [facility];  Service: Cardiovascular;  Laterality: N/A;   PERIPHERAL VASCULAR BALLOON ANGIOPLASTY Left 08/10/2020   Procedure: PERIPHERAL VASCULAR BALLOON ANGIOPLASTY;  Surgeon: Waynetta Sandy, MD;  Location: [place] [facility];  Service: Cardiovascular;  Laterality: Left;  lower extremity venous   PERIPHERAL VASCULAR THROMBECTOMY N/A 08/10/2020   Procedure: PERIPHERAL VASCULAR THROMBECTOMY;  Surgeon: Waynetta Sandy, MD;  Location: [place] [facility];  Service: Cardiovascular;  Laterality: N/A;   POLYPECTOMY  04/18/2017   Procedure: POLYPECTOMY;  Surgeon: Daneil Dolin, MD;  [facility];  Service: Endoscopy;;   POLYPECTOMY  04/13/2022   Procedure: POLYPECTOMY;  Surgeon: Daneil Dolin, MD;  [facility];  Service: Endoscopy;;    Current Outpatient Medications  Medication Sig Dispense Refill   acetaminophen (TYLENOL) 500 MG tablet Take 2,000 mg by mouth every 6 (six) hours as needed for moderate pain.     albuterol (PROVENTIL HFA) 108 (90 Base) MCG/ACT inhaler  INHALE 2 PUFFS BY MOUTH EVERY 6 HOURS AS NEEDED FOR COUGHING, WHEEZING, OR SHORTNESS OF BREATH 18 g 1   aspirin 81 MG chewable tablet Chew 81 mg by mouth daily.     Cyanocobalamin (B-12 COMPLIANCE INJECTION IJ) Inject 1 Dose as directed every 30 (thirty) days.     furosemide (LASIX) 20 MG tablet Take 2 tablets in morning and 1 tablet in the afternoon. (40 milligrams in morning and 20 milligrams in afternoon). 90 tablet 3   Menthol-Methyl Salicylate (MUSCLE RUB) 10-15 % CREA Apply 1 Application topically as needed for muscle pain.     metoprolol tartrate (LOPRESSOR) 25 MG tablet Take 25 mg by mouth 2 (two) times daily.     mometasone-formoterol (DULERA) 200-5 MCG/ACT AERO Inhale 2 puffs into the lungs 2 (two) times daily. 13 g 3   pantoprazole (PROTONIX) 40 MG tablet Take 1 tablet (40 mg total) by mouth daily. 90 tablet 3   sodium bicarbonate 650 MG tablet Take 650 mg by mouth 2 (two) times daily.     spironolactone (ALDACTONE) 50 MG tablet Take 50-100 mg by mouth See admin instructions. Take 100 mg by mouth in morning and 50 mg in the afternoon     ondansetron (ZOFRAN-ODT) 4 MG disintegrating tablet 4 mg every 8 (eight) hours as needed. (Patient not taking: Reported on 05/19/2022)     No current facility-administered medications for this visit.    Allergies as of 06/14/2022   (No Known Allergies)    Family History  Problem Relation Age of Onset   Cancer Father        throat   Diabetes Sister    Colon cancer Neg Hx     Social History   Socioeconomic History   Marital status: Single    Spouse name: Not on file   Number of children: Not on file   Years of education: Not on file   Highest education level: Not on file  Occupational History   Not on file  Tobacco Use   Smoking status: Every Day    Packs/day: 0.50    Years: 33.00    Total pack years: 16.50    Types: Cigarettes    Passive exposure: Current   Smokeless tobacco: Never  Vaping Use   Vaping Use: Never used   Substance and Sexual Activity   Alcohol use: Not Currently    Comment: last deink 1 week ago   Drug use: No   Sexual activity: Yes    Birth control/protection: None  Other Topics Concern   Not on file  Social History Narrative   Not on file   Social Determinants of Health   Financial Resource Strain: High Risk (09/13/2020)   Overall Financial Resource Strain (CARDIA)    Difficulty of Paying Living Expenses: Very hard  Food Insecurity: No Food Insecurity (05/20/2022)   Hunger Vital Sign    Worried About Running Out of Food in the Last Year: Never true    Ran Out of Food in the Last Year: Never true  Transportation Needs: No Transportation Needs (05/20/2022)   PRAPARE - Transportation    Lack  of Transportation (Medical): No    Lack of Transportation (Non-Medical): No  Physical Activity: Sufficiently Active (09/13/2020)   Exercise Vital Sign    Days of Exercise per Week: 7 days    Minutes of Exercise per Session: 30 min  Stress: No Stress Concern Present (09/13/2020)   [place]    Feeling of Stress : Only a little  Social Connections: Moderately Isolated (09/13/2020)   Social Connection and Isolation Panel [NHANES]    Frequency of Communication with Friends and Family: More than three times a week    Frequency of Social Gatherings with Friends and Family: Once a week    Attends Religious Services: More than 4 times per year    Active Member of [employer] or Organizations: No    Attends Archivist Meetings: Never    Marital Status: Never married  Intimate Partner Violence: Not At Risk (05/20/2022)   Humiliation, Afraid, Rape, and Kick questionnaire    Fear of Current or Ex-Partner: No    Emotionally Abused: No    Physically Abused: No    Sexually Abused: No     Review of Systems   Gen: Denies any fever, chills, fatigue, weight loss, lack of appetite.  CV: Denies chest pain, heart palpitations,  peripheral edema, syncope.  Resp: Denies shortness of breath at rest or with exertion. Denies wheezing or cough.  GI: Denies dysphagia or odynophagia. Denies jaundice, hematemesis, fecal incontinence. GU : Denies urinary burning, urinary frequency, urinary hesitancy MS: Denies joint pain, muscle weakness, cramps, or limitation of movement.  Derm: Denies rash, itching, dry skin Psych: Denies depression, anxiety, memory loss, and confusion Heme: Denies bruising, bleeding, and enlarged lymph nodes.   Physical Exam   BP (!) 143/81 (BP Location: Right Arm, Patient Position: Sitting, Cuff Size: Normal)   Pulse 72   Temp 98.1 F (36.7 C) (Oral)   Ht 6' (1.829 m)   Wt 158 lb 11.2 oz (72 kg)   BMI 21.52 kg/m  General:   Alert and oriented. Pleasant and cooperative. Well-nourished and well-developed.  Head:  Normocephalic and atraumatic. Eyes:  Without icterus Abdomen:  +BS, soft, non-tender and non-distended. No HSM noted. No guarding or rebound. No masses appreciated.  Rectal:  Deferred  Msk:  Symmetrical without gross deformities. Normal posture. Extremities:  Without edema. Neurologic:  Alert and  oriented x4;  grossly normal neurologically. Skin:  Intact without significant lesions or rashes. Psych:  Alert and cooperative. Normal mood and affect.   Assessment   [name] [name] is a 57 y.o. male presenting today in follow-up with a history of cirrhosis due to ETOH, followed by Liver clinic in [place] as well Roosevelt Locks, FNP).   Recent hospitalization due to pancreatitis: felt related to cholelithiasis and ETOH. Will need repeat MRI in 6 weeks.   Cholelithiasis: and history of gallbladder polyp. Asymptomatic currently. Pending referral to [facility] for elective cholecystectomy.   Cirrhosis: fairly well-compensated.  EGD May 2023 with normal esophagus but retained food in stomach and duodenum precluded exam. Lasix 40 mg in am and 20 mg in pm, spironolactone 100 mg in am and 50 mg in  pm without edema or ascites. AFP Jan 2024 was normal. Last alcohol early January 2024. We discussed absolute avoidance.   Adenomas: colonoscopy Dec 2023 with multiple polyps and one 60m poly. Surveillance in 3 years.    PLAN    No change to diuretic therapy MRI pancreas in 6 weeks Continue  to abstain from all alcohol Pending referral for elective cholecystectomy. May be high risk candidate Close follow-up in 3 months   Annitta Needs, PhD, ANP-BC [facility]

## 2022-06-19 ENCOUNTER — Encounter (INDEPENDENT_AMBULATORY_CARE_PROVIDER_SITE_OTHER): Payer: Self-pay | Admitting: *Deleted

## 2022-06-19 ENCOUNTER — Other Ambulatory Visit (INDEPENDENT_AMBULATORY_CARE_PROVIDER_SITE_OTHER): Payer: Self-pay | Admitting: *Deleted

## 2022-06-19 DIAGNOSIS — K852 Alcohol induced acute pancreatitis without necrosis or infection: Secondary | ICD-10-CM

## 2022-06-19 DIAGNOSIS — K7030 Alcoholic cirrhosis of liver without ascites: Secondary | ICD-10-CM

## 2022-06-20 ENCOUNTER — Telehealth (INDEPENDENT_AMBULATORY_CARE_PROVIDER_SITE_OTHER): Payer: Self-pay | Admitting: *Deleted

## 2022-06-20 NOTE — Telephone Encounter (Signed)
I called patient back and he said I'm returning a call. I looked up upcoming appts and told him he had MRI scheduled 4/1 arrive 10:30 am and NPO 4 hours prior to MRI. He verbalized understanding.

## 2022-06-20 NOTE — Telephone Encounter (Signed)
Patient left voicemail asking for someone to call him back on [phone]. I called back and no answer. Vm not set up. Will try again later today.

## 2022-06-30 ENCOUNTER — Inpatient Hospital Stay: Payer: Medicaid Other | Attending: Physician Assistant

## 2022-06-30 VITALS — BP 168/90 | HR 70 | Temp 97.5°F | Resp 18

## 2022-06-30 DIAGNOSIS — E538 Deficiency of other specified B group vitamins: Secondary | ICD-10-CM | POA: Insufficient documentation

## 2022-06-30 MED ORDER — CYANOCOBALAMIN 1000 MCG/ML IJ SOLN
1000.0000 ug | Freq: Once | INTRAMUSCULAR | Status: AC
  Administered 2022-06-30: 1000 ug via INTRAMUSCULAR
  Filled 2022-06-30: qty 1

## 2022-06-30 NOTE — Patient Instructions (Signed)
[facility]  Discharge Instructions: Thank you for choosing [facility] to provide your oncology and hematology care.  If you have a lab appointment with the Cancer Center, please come in thru the Main Entrance and check in at the main information desk.  Wear comfortable clothing and clothing appropriate for easy access to any Portacath or PICC line.   We strive to give you quality time with your provider. You may need to reschedule your appointment if you arrive late (15 or more minutes).  Arriving late affects you and other patients whose appointments are after yours.  Also, if you miss three or more appointments without notifying the office, you may be dismissed from the clinic at the provider's discretion.      For prescription refill requests, have your pharmacy contact our office and allow 72 hours for refills to be completed.    Vitamin B12 Injection What is this medication? Vitamin B12 (VAHY tuh min B12) prevents and treats low vitamin B12 levels in your body. It is used in people who do not get enough vitamin B12 from their diet or when their digestive tract does not absorb enough. Vitamin B12 plays an important role in maintaining the health of your nervous system and red blood cells. This medicine may be used for other purposes; ask your health care provider or pharmacist if you have questions. COMMON BRAND NAME(S): B-12 Compliance Kit, B-12 Injection Kit, Cyomin, Dodex, LA-12, Nutri-Twelve, Physicians EZ Use B-12, Primabalt What should I tell my care team before I take this medication? They need to know if you have any of these conditions: Kidney disease Leber's disease Megaloblastic anemia An unusual or allergic reaction to cyanocobalamin, cobalt, other medications, foods, dyes, or preservatives Pregnant or trying to get pregnant Breast-feeding How should I use this medication? This medication is injected into a muscle or deeply under the skin.  It is usually given in a clinic or care team's office. However, your care team may teach you how to inject yourself. Follow all instructions. Talk to your care team about the use of this medication in children. Special care may be needed. Overdosage: If you think you have taken too much of this medicine contact a poison control center or emergency room at once. NOTE: This medicine is only for you. Do not share this medicine with others. What if I miss a dose? If you are given your dose at a clinic or care team's office, call to reschedule your appointment. If you give your own injections, and you miss a dose, take it as soon as you can. If it is almost time for your next dose, take only that dose. Do not take double or extra doses. What may interact with this medication? Alcohol Colchicine This list may not describe all possible interactions. Give your health care provider a list of all the medicines, herbs, non-prescription drugs, or dietary supplements you use. Also tell them if you smoke, drink alcohol, or use illegal drugs. Some items may interact with your medicine. What should I watch for while using this medication? Visit your care team regularly. You may need blood work done while you are taking this medication. You may need to follow a special diet. Talk to your care team. Limit your alcohol intake and avoid smoking to get the best benefit. What side effects may I notice from receiving this medication? Side effects that you should report to your care team as soon as possible: Allergic reactions--skin rash,   itching, hives, swelling of the face, lips, tongue, or throat Swelling of the ankles, hands, or feet Trouble breathing Side effects that usually do not require medical attention (report to your care team if they continue or are bothersome): Diarrhea This list may not describe all possible side effects. Call your doctor for medical advice about side effects. You may report side effects  to FDA at 1-800-FDA-1088. Where should I keep my medication? Keep out of the reach of children. Store at room temperature between 15 and 30 degrees C (59 and 85 degrees F). Protect from light. Throw away any unused medication after the expiration date. NOTE: This sheet is a summary. It may not cover all possible information. If you have questions about this medicine, talk to your doctor, pharmacist, or health care provider.  2023 Elsevier/Gold Standard (2020-12-28 00:00:00)   To help prevent nausea and vomiting after your treatment, we encourage you to take your nausea medication as directed.  BELOW ARE SYMPTOMS THAT SHOULD BE REPORTED IMMEDIATELY: *FEVER GREATER THAN 100.4 F (38 C) OR HIGHER *CHILLS OR SWEATING *NAUSEA AND VOMITING THAT IS NOT CONTROLLED WITH YOUR NAUSEA MEDICATION *UNUSUAL SHORTNESS OF BREATH *UNUSUAL BRUISING OR BLEEDING *URINARY PROBLEMS (pain or burning when urinating, or frequent urination) *BOWEL PROBLEMS (unusual diarrhea, constipation, pain near the anus) TENDERNESS IN MOUTH AND THROAT WITH OR WITHOUT PRESENCE OF ULCERS (sore throat, sores in mouth, or a toothache) UNUSUAL RASH, SWELLING OR PAIN  UNUSUAL VAGINAL DISCHARGE OR ITCHING   Items with * indicate a potential emergency and should be followed up as soon as possible or go to the Emergency Department if any problems should occur.  Please show the CHEMOTHERAPY ALERT CARD or IMMUNOTHERAPY ALERT CARD at check-in to the Emergency Department and triage nurse.  Should you have questions after your visit or need to cancel or reschedule your appointment, please contact [facility] [phone]  and follow the prompts.  Office hours are 8:00 a.m. to 4:30 p.m. Monday - Friday. Please note that voicemails left after 4:00 p.m. may not be returned until the following business day.  We are closed weekends and major holidays. You have access to a nurse at all times for urgent questions. Please call  the main number to the clinic [phone] and follow the prompts.  For any non-urgent questions, you may also contact your provider using MyChart. We now offer e-Visits for anyone 69 and older to request care online for non-urgent symptoms. For details visit [email or web address].   Also download the MyChart app! Go to the app store, search "MyChart", open the app, select [facility], and log in with your MyChart username and password.

## 2022-06-30 NOTE — Progress Notes (Signed)
Patient tolerated Vitamin B 12 injection with no complaints voiced.  Site clean and dry with no bruising or swelling noted.  No complaints of pain.  Discharged with vital signs stable and no signs or symptoms of distress noted.   ?

## 2022-07-05 ENCOUNTER — Telehealth (INDEPENDENT_AMBULATORY_CARE_PROVIDER_SITE_OTHER): Payer: Self-pay | Admitting: *Deleted

## 2022-07-05 NOTE — Telephone Encounter (Signed)
PA approved via [employer]lon. Order ID: [id number]       Authorized Approval Valid Through: 07/05/2022 - 09/02/2022

## 2022-07-06 ENCOUNTER — Ambulatory Visit (INDEPENDENT_AMBULATORY_CARE_PROVIDER_SITE_OTHER): Payer: Medicaid Other | Admitting: Urology

## 2022-07-06 ENCOUNTER — Encounter: Payer: Self-pay | Admitting: Urology

## 2022-07-06 VITALS — BP 157/89 | HR 73 | Ht 72.0 in | Wt 160.0 lb

## 2022-07-06 DIAGNOSIS — R8271 Bacteriuria: Secondary | ICD-10-CM

## 2022-07-06 DIAGNOSIS — N261 Atrophy of kidney (terminal): Secondary | ICD-10-CM

## 2022-07-06 DIAGNOSIS — N1339 Other hydronephrosis: Secondary | ICD-10-CM

## 2022-07-06 DIAGNOSIS — Z87442 Personal history of urinary calculi: Secondary | ICD-10-CM | POA: Diagnosis not present

## 2022-07-06 LAB — MICROSCOPIC EXAMINATION

## 2022-07-06 LAB — URINALYSIS, ROUTINE W REFLEX MICROSCOPIC
Bilirubin, UA: NEGATIVE
Glucose, UA: NEGATIVE
Ketones, UA: NEGATIVE
Leukocytes,UA: NEGATIVE
Nitrite, UA: NEGATIVE
RBC, UA: NEGATIVE
Specific Gravity, UA: 1.02 (ref 1.005–1.030)
Urobilinogen, Ur: 0.2 mg/dL (ref 0.2–1.0)
pH, UA: 7 (ref 5.0–7.5)

## 2022-07-06 NOTE — Progress Notes (Signed)
Subjective: 1. Other hydronephrosis   2. History of nephrolithiasis   3. Bacteriuria   4. Renal atrophy, left      Consult requested by Dr. Melina Copa  [name] [name] is 57 yo male who has a history of stones with prior intervention in 2014 by Dr. Michela Pitcher.  He was hospitalized in 1/24 with right flank pain and a CT showed right hydro with possible interval passage of a stone.   He was noted to have chronic cortical atrophy and full bladder but he subsequently voided.  He had an MRI of the abdomen a few days later and the hydronephrosis had resolved.  He has had no further pain.  He had a renal US in 11/23 that showed a sessile polypoid lesion in the bladder but that was not seen on CT.   He has had chronic pyuria and had yeast in the urine in 1/24.  He is voiding well with an IPSS of 4 and nocturia x 2.  He had AKI in the hospital but the Cr. Was down to 1.27 at DC.  His PSA has been slowly rising but was 2.8 in 2/23.  The doubling time is about 3 years.  He had aerococcus on his urine cultures on 05/19/22 and 03/02/22.  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  ROS:  Review of Systems  Cardiovascular:  Positive for leg swelling (in the left knee with walking.).  All other systems reviewed and are negative.   No Known Allergies  Past Medical History:  Diagnosis Date   Asthma    B12 deficiency 03/15/2022   Cirrhosis ([place])    Dyspnea    High cholesterol    History of kidney stones    Hypertension    Kidney stones     Past Surgical History:  Procedure Laterality Date   APPENDECTOMY     BIOPSY  07/22/2020   Procedure: BIOPSY;  Surgeon: Daneil Dolin, MD;  Location: [facility];  Service: Endoscopy;;   COLONOSCOPY WITH PROPOFOL N/A 04/18/2017   non-bleeding internal hemorrhoids, two 4-6 mm polyps in descending colon and cecum, pancolonic diverticulosis, single cecal AVM. Tubular adenomas, surveillance in 2023.    COLONOSCOPY WITH PROPOFOL N/A  04/13/2022   pancolonic diverticulosis, multiple polyps, one which was 11 mm (tubular adenomas). Surveillance in 3 years.   ESOPHAGOGASTRODUODENOSCOPY (EGD) WITH PROPOFOL N/A 07/22/2020   normal esophagus, small hiatal hernia, abnormal gastric mucosa, abnormal appearing ampula and periampullary mucosa. Mild chronic gastritis.   ESOPHAGOGASTRODUODENOSCOPY (EGD) WITH PROPOFOL N/A 09/08/2021   normal esophagus, retained food in stomach and duodenum precluded complete examination.   FRACTURE SURGERY     left arm   IR PARACENTESIS  05/31/2021   LOWER EXTREMITY VENOGRAPHY N/A 08/10/2020   Procedure: LOWER EXTREMITY VENOGRAPHY;  Surgeon: Waynetta Sandy, MD;  Location: [facility];  Service: Cardiovascular;  Laterality: N/A;   PERIPHERAL VASCULAR BALLOON ANGIOPLASTY Left 08/10/2020   Procedure: PERIPHERAL VASCULAR BALLOON ANGIOPLASTY;  Surgeon: Waynetta Sandy, MD;  Location: [facility];  Service: Cardiovascular;  Laterality: Left;  lower extremity venous   PERIPHERAL VASCULAR THROMBECTOMY N/A 08/10/2020   Procedure: PERIPHERAL VASCULAR THROMBECTOMY;  Surgeon: Waynetta Sandy, MD;  Location: [facility];  Service: Cardiovascular;  Laterality: N/A;   POLYPECTOMY  04/18/2017   Procedure: POLYPECTOMY;  Surgeon: Daneil Dolin, MD;  [facility];  Service: Endoscopy;;   POLYPECTOMY  04/13/2022  Procedure: POLYPECTOMY;  Surgeon: Daneil Dolin, MD;  [facility];  Service: Endoscopy;;    Social History   Socioeconomic History   Marital status: Single    Spouse name: Not on file   Number of children: Not on file   Years of education: Not on file   Highest education level: Not on file  Occupational History   Not on file  Tobacco Use   Smoking status: Every Day    Packs/day: 0.50    Years: 33.00    Total pack years: 16.50    Types: Cigarettes    Passive exposure: Current   Smokeless tobacco: Never  Vaping Use    Vaping Use: Never used  Substance and Sexual Activity   Alcohol use: Not Currently    Comment: last deink 1 week ago   Drug use: No   Sexual activity: Yes    Birth control/protection: None  Other Topics Concern   Not on file  Social History Narrative   Not on file   Social Determinants of Health   Financial Resource Strain: High Risk (09/13/2020)   Overall Financial Resource Strain (CARDIA)    Difficulty of Paying Living Expenses: Very hard  Food Insecurity: No Food Insecurity (05/20/2022)   Hunger Vital Sign    Worried About Running Out of Food in the Last Year: Never true    Ran Out of Food in the Last Year: Never true  Transportation Needs: No Transportation Needs (05/20/2022)   PRAPARE - Hydrologist (Medical): No    Lack of Transportation (Non-Medical): No  Physical Activity: Sufficiently Active (09/13/2020)   Exercise Vital Sign    Days of Exercise per Week: 7 days    Minutes of Exercise per Session: 30 min  Stress: No Stress Concern Present (09/13/2020)   [place]    Feeling of Stress : Only a little  Social Connections: Moderately Isolated (09/13/2020)   Social Connection and Isolation Panel [NHANES]    Frequency of Communication with Friends and Family: More than three times a week    Frequency of Social Gatherings with Friends and Family: Once a week    Attends Religious Services: More than 4 times per year    Active Member of [employer] or Organizations: No    Attends Archivist Meetings: Never    Marital Status: Never married  Intimate Partner Violence: Not At Risk (05/20/2022)   Humiliation, Afraid, Rape, and Kick questionnaire    Fear of Current or Ex-Partner: No    Emotionally Abused: No    Physically Abused: No    Sexually Abused: No    Family History  Problem Relation Age of Onset   Cancer Father        throat   Diabetes Sister    Colon cancer Neg Hx      Anti-infectives: Anti-infectives (From admission, onward)    None       Current Outpatient Medications  Medication Sig Dispense Refill   acetaminophen (TYLENOL) 500 MG tablet Take 2,000 mg by mouth every 6 (six) hours as needed for moderate pain.     albuterol (PROVENTIL HFA) 108 (90 Base) MCG/ACT inhaler INHALE 2 PUFFS BY MOUTH EVERY 6 HOURS AS NEEDED FOR COUGHING, WHEEZING, OR SHORTNESS OF BREATH 18 g 1   aspirin 81 MG chewable tablet Chew 81 mg by mouth daily.     Cyanocobalamin (B-12 COMPLIANCE INJECTION IJ) Inject 1 Dose as  directed every 30 (thirty) days.     furosemide (LASIX) 20 MG tablet Take 2 tablets in morning and 1 tablet in the afternoon. (40 milligrams in morning and 20 milligrams in afternoon). 90 tablet 3   Menthol-Methyl Salicylate (MUSCLE RUB) 10-15 % CREA Apply 1 Application topically as needed for muscle pain.     metoprolol tartrate (LOPRESSOR) 25 MG tablet Take 25 mg by mouth 2 (two) times daily.     mometasone-formoterol (DULERA) 200-5 MCG/ACT AERO Inhale 2 puffs into the lungs 2 (two) times daily. 13 g 3   ondansetron (ZOFRAN-ODT) 4 MG disintegrating tablet 4 mg every 8 (eight) hours as needed.     pantoprazole (PROTONIX) 40 MG tablet Take 1 tablet (40 mg total) by mouth daily. 90 tablet 3   sodium bicarbonate 650 MG tablet Take 650 mg by mouth 2 (two) times daily.     spironolactone (ALDACTONE) 50 MG tablet Take 50-100 mg by mouth See admin instructions. Take 100 mg by mouth in morning and 50 mg in the afternoon     No current facility-administered medications for this visit.     Objective: Vital signs in last 24 hours: BP (!) 157/89   Pulse 73   Ht 6' (1.829 m)   Wt 160 lb (72.6 kg)   BMI 21.70 kg/m   Intake/Output from previous day: No intake/output data recorded. Intake/Output this shift: '@IOTHISSHIFT'$ @   Physical Exam Vitals reviewed.  Constitutional:      Appearance: Normal appearance.  Cardiovascular:     Rate and Rhythm: Normal rate  and regular rhythm.     Heart sounds: Normal heart sounds.  Pulmonary:     Effort: Pulmonary effort is normal.     Breath sounds: Normal breath sounds.  Abdominal:     General: Abdomen is flat.     Palpations: Abdomen is soft. There is no mass.  Genitourinary:    Comments: Nl phallus with adequate meatus. Scrotum, testes and epididymis normal. AP/NST without mass or lesion. Prostate 1.5+ benign. SV non-palpable.  Musculoskeletal:        General: No swelling or tenderness. Normal range of motion.     Cervical back: Normal range of motion and neck supple.  Lymphadenopathy:     Cervical: No cervical adenopathy.     Upper Body:     Right upper body: No supraclavicular adenopathy.     Left upper body: No supraclavicular adenopathy.  Skin:    General: Skin is warm and dry.  Neurological:     General: No focal deficit present.     Mental Status: He is alert and oriented to person, place, and time.  Psychiatric:        Mood and Affect: Mood normal.        Behavior: Behavior normal.     Lab Results:  Results for orders placed or performed in visit on 07/06/22 (from the past 24 hour(s))  Urinalysis, Routine w reflex microscopic     Status: Abnormal   Collection Time: 07/06/22  1:53 PM  Result Value Ref Range   Specific Gravity, UA 1.020 1.005 - 1.030   pH, UA 7.0 5.0 - 7.5   Color, UA Yellow Yellow   Appearance Ur Cloudy (A) Clear   Leukocytes,UA Negative Negative   Protein,UA 3+ (A) Negative/Trace   Glucose, UA Negative Negative   Ketones, UA Negative Negative   RBC, UA Negative Negative   Bilirubin, UA Negative Negative   Urobilinogen, Ur 0.2 0.2 - 1.0 mg/dL  Nitrite, UA Negative Negative   Microscopic Examination See below:    Narrative   Performed at:  [facility] [address], [place], [place]  [id number] Lab Director: Mina Marble MT, Phone:  [phone]  Microscopic Examination     Status: Abnormal   Collection Time: 07/06/22  1:53 PM   Urine   Result Value Ref Range   WBC, UA 0-5 0 - 5 /hpf   RBC, Urine 0-2 0 - 2 /hpf   Epithelial Cells (non renal) 0-10 0 - 10 /hpf   Casts Present (A) None seen /lpf   Cast Type Granular casts (A) N/A   Bacteria, UA Many (A) None seen/Few   Narrative   Performed at:  [facility] [address] [place], [place]  [id number] Lab Director: [place], Phone:  [phone]    BMET No results for input(s): "NA", "K", "CL", "CO2", "GLUCOSE", "BUN", "CREATININE", "CALCIUM" in the last 72 hours. PT/INR No results for input(s): "LABPROT", "INR" in the last 72 hours. ABG No results for input(s): "PHART", "HCO3" in the last 72 hours.  Invalid input(s): "PCO2", "PO2" .resul Studies/Results: No results found. MR ABDOMEN MRCP W WO CONTAST  Result Date: 05/23/2022 CLINICAL DATA:  Severe acute pancreatitis and abdominal pain. EXAM: MRI ABDOMEN WITHOUT AND WITH CONTRAST (INCLUDING MRCP) TECHNIQUE: Multiplanar multisequence MR imaging of the abdomen was performed both before and after the administration of intravenous contrast. Heavily T2-weighted images of the biliary and pancreatic ducts were obtained, and three-dimensional MRCP images were rendered by post processing. CONTRAST:  68m GADAVIST GADOBUTROL 1 MMOL/ML IV SOLN COMPARISON:  May 19, 2022 CT and imaging from May 22, 2022. FINDINGS: Lower chest: Incidental imaging of the lung bases with trace effusions. Hepatobiliary: No focal, suspicious hepatic lesion. Fissural widening of hepatic fissures and mild notching of hepatic margins. No pericholecystic stranding. No wall thickening or hyperenhancement of the gallbladder wall. Sludge and small stones in the dependent gallbladder. No biliary duct dilation or choledocholithiasis. MRCP sequences are mildly motion limited caliber of the common bile duct is approximately 5-6 mm. Portal vein is patent. Mild hepatic and splenic iron deposition is suggested. Pancreas: Signs of acute  interstitial edema of the pancreatic-duodenal groove. Edematous changes tract throughout the proximal body and about the celiac. There is no focal pancreatic fluid collection. There is a subtle area in the head of the pancreas on T2 weighted imaging anterior to the common bile duct on image 22/5 this is of intermediate T2 signal a measures 6 mm. There is smooth, tapered appearing narrowing of the distal common bile duct in this area perhaps from edema though with nonspecific features. Also seen on image 21/12. Mild blunting of normal T1 signal in the pancreas with geographic features about the pancreatic head and proximal body. Glandular enhancement is preserved. Spleen:  Normal. Adrenals/Urinary Tract: Marked LEFT renal cortical scarring. No suspicious renal lesion or hydronephrosis. Adrenal glands are normal. Stomach/Bowel: Normal to the extent evaluated. Vascular/Lymphatic:  Patent splenic vein.  No adenopathy. Other:  Trace perihepatic ascites Musculoskeletal: No suspicious bone lesions identified. IMPRESSION: 1. Signs of acute interstitial edema of the pancreatic-duodenal groove and proximal body of the pancreas. No large pancreatic fluid collection. No peripancreatic fluid collection currently with edema tracking throughout tissue planes in the upper abdomen. 2. 6 mm area of increased T2 signal which is mainly intermediate T2 signal without visible lesion on post-contrast imaging in this area suggests focal inflammation or developing intrapancreatic pseudocyst and is at  an area where there is subtle ductal transition of the common bile duct. Perhaps this relates to edema compressing the duct. Would however suggest 6-8 week follow-up after resolution of acute symptoms to assess for any change, exclude underlying lesion and further evaluate this area given motion on current imaging on MRCP sequences. 3. Sludge and small stones in the dependent gallbladder. No biliary duct dilation or choledocholithiasis. 4. Mild  hepatic and splenic iron deposition is suggested. Fissural widening of hepatic fissures should be correlated with any clinical or laboratory evidence of liver disease. No overt signs of portal hypertension. 5. Marked LEFT renal cortical scarring. Electronically Signed   By: Zetta Bills M.D.   On: 05/23/2022 19:24   MR 3D Recon At Scanner  Result Date: 05/23/2022 CLINICAL DATA:  Severe acute pancreatitis and abdominal pain. EXAM: MRI ABDOMEN WITHOUT AND WITH CONTRAST (INCLUDING MRCP) TECHNIQUE: Multiplanar multisequence MR imaging of the abdomen was performed both before and after the administration of intravenous contrast. Heavily T2-weighted images of the biliary and pancreatic ducts were obtained, and three-dimensional MRCP images were rendered by post processing. CONTRAST:  37m GADAVIST GADOBUTROL 1 MMOL/ML IV SOLN COMPARISON:  May 19, 2022 CT and imaging from May 22, 2022. FINDINGS: Lower chest: Incidental imaging of the lung bases with trace effusions. Hepatobiliary: No focal, suspicious hepatic lesion. Fissural widening of hepatic fissures and mild notching of hepatic margins. No pericholecystic stranding. No wall thickening or hyperenhancement of the gallbladder wall. Sludge and small stones in the dependent gallbladder. No biliary duct dilation or choledocholithiasis. MRCP sequences are mildly motion limited caliber of the common bile duct is approximately 5-6 mm. Portal vein is patent. Mild hepatic and splenic iron deposition is suggested. Pancreas: Signs of acute interstitial edema of the pancreatic-duodenal groove. Edematous changes tract throughout the proximal body and about the celiac. There is no focal pancreatic fluid collection. There is a subtle area in the head of the pancreas on T2 weighted imaging anterior to the common bile duct on image 22/5 this is of intermediate T2 signal a measures 6 mm. There is smooth, tapered appearing narrowing of the distal common bile duct in this area  perhaps from edema though with nonspecific features. Also seen on image 21/12. Mild blunting of normal T1 signal in the pancreas with geographic features about the pancreatic head and proximal body. Glandular enhancement is preserved. Spleen:  Normal. Adrenals/Urinary Tract: Marked LEFT renal cortical scarring. No suspicious renal lesion or hydronephrosis. Adrenal glands are normal. Stomach/Bowel: Normal to the extent evaluated. Vascular/Lymphatic:  Patent splenic vein.  No adenopathy. Other:  Trace perihepatic ascites Musculoskeletal: No suspicious bone lesions identified. IMPRESSION: 1. Signs of acute interstitial edema of the pancreatic-duodenal groove and proximal body of the pancreas. No large pancreatic fluid collection. No peripancreatic fluid collection currently with edema tracking throughout tissue planes in the upper abdomen. 2. 6 mm area of increased T2 signal which is mainly intermediate T2 signal without visible lesion on post-contrast imaging in this area suggests focal inflammation or developing intrapancreatic pseudocyst and is at an area where there is subtle ductal transition of the common bile duct. Perhaps this relates to edema compressing the duct. Would however suggest 6-8 week follow-up after resolution of acute symptoms to assess for any change, exclude underlying lesion and further evaluate this area given motion on current imaging on MRCP sequences. 3. Sludge and small stones in the dependent gallbladder. No biliary duct dilation or choledocholithiasis. 4. Mild hepatic and splenic iron deposition is suggested. Fissural widening  of hepatic fissures should be correlated with any clinical or laboratory evidence of liver disease. No overt signs of portal hypertension. 5. Marked LEFT renal cortical scarring. Electronically Signed   By: Zetta Bills M.D.   On: 05/23/2022 19:24   US Abdomen Limited RUQ (LIVER/GB)  Result Date: 05/22/2022 CLINICAL DATA:  Right upper quadrant abdominal pain  EXAM: ULTRASOUND ABDOMEN LIMITED RIGHT UPPER QUADRANT COMPARISON:  CT scan 05/19/2022 FINDINGS: Gallbladder: Poorly shadowing gallstones are present in the gallbladder and appear mobile, largest 1.5 cm. Sonographic Murphy's sign absent. Mild gallbladder wall thickening at 0.3 cm. Trace perihepatic ascites including in the gallbladder fossa. Common bile duct: Diameter: 0.5 cm, within normal limits Liver: Nodular contour with mild nonfocal internal heterogeneity and mildly accentuated echogenicity. Portal vein is patent on color Doppler imaging with normal direction of blood flow towards the liver. Other: None. IMPRESSION: 1. Cholelithiasis with mild gallbladder wall thickening, which could be from the patient's hypoproteinemia/hypoalbuminemia or inflammation. Sonographic Murphy's sign absent. Trace perihepatic ascites some of which is adjacent to the gallbladder. Correlate clinically in assessing for cholecystitis. 2. Nodular contour of the liver with mild nonfocal internal heterogeneity and mildly accentuated echogenicity. Findings are nonspecific but could reflect underlying cirrhosis. 3. Trace perihepatic ascites. Electronically Signed   By: Van Clines M.D.   On: 05/22/2022 08:56   CT Renal Stone Study  Result Date: 05/19/2022 CLINICAL DATA:  Right abdominal and flank pain with stone suspected. No appetite for a week. Low back pain. EXAM: CT ABDOMEN AND PELVIS WITHOUT CONTRAST TECHNIQUE: Multidetector CT imaging of the abdomen and pelvis was performed following the standard protocol without IV contrast. RADIATION DOSE REDUCTION: This exam was performed according to the departmental dose-optimization program which includes automated exposure control, adjustment of the mA and/or kV according to patient size and/or use of iterative reconstruction technique. COMPARISON:  MRI abdomen 06/29/2021. CT abdomen and pelvis 05/27/2021 FINDINGS: Lower chest: Lung bases are clear. Hepatobiliary: Enlargement of the  lateral segment left lobe of liver and nodular liver contour suggests cirrhosis. No focal lesions are demonstrated. Small stones in the gallbladder. No wall thickening or inflammatory changes. No bile duct dilatation. Pancreas: The head of the pancreas appears prominent. This could be due to noncontrast imaging with partial voluming to adjacent structures but it appears more prominent than on the previous study. Given the history of loss of appetite, contrast-enhanced imaging is recommended to exclude a pancreatic mass or focal pancreatitis. Consider follow-up with contrast-enhanced CT or MRI. No pancreatic ductal dilatation. Spleen: Normal in size without focal abnormality. Adrenals/Urinary Tract: No adrenal gland nodules. Left renal atrophy. Left intrarenal stones measuring up to 2-3 mm. No hydronephrosis or hydroureter. There is mild right hydronephrosis and hydroureter down to the level of the bladder. No discrete stones are demonstrated. This could represent sequela of recently passed stone, reflux, or occult non radiopaque stone. Bladder is normal. Stomach/Bowel: Stomach, small bowel, and colon are not abnormally distended. No wall thickening or inflammatory changes. Scattered colonic diverticula without evidence of acute diverticulitis. Appendix is not visualized. Vascular/Lymphatic: Aortic atherosclerosis. No enlarged abdominal or pelvic lymph nodes. Reproductive: Prostate is unremarkable. Other: Interval resolution of previous ascites. No free air or free fluid is identified today. Abdominal wall musculature appears intact. Musculoskeletal: Acute appearing fractures of the left posterior tenth and eleventh ribs. IMPRESSION: 1. Changes of hepatic cirrhosis. 2. Cholelithiasis without evidence of acute cholecystitis. 3. Nonspecific prominent appearance of the head of the pancreas, possibly artifactual but could indicate pancreatic mass or focal  pancreatitis. Consider follow-up with contrast-enhanced CT or MRI.  4. Nonobstructing intrarenal stones on the left. 5. Right hydronephrosis and hydroureter. No right ureteral stones are identified. Changes could indicate recently passed stone, reflux disease, or occult non radiopaque stone. 6. Aortic atherosclerosis. 7. Acute fractures of the left posterior tenth and eleventh ribs. Electronically Signed   By: Lucienne Capers M.D.   On: 05/19/2022 20:18   DG Chest 2 View  Result Date: 05/19/2022 CLINICAL DATA:  Chest pain. Right-sided chest pain and low back pain. EXAM: CHEST - 2 VIEW COMPARISON:  05/30/2021. FINDINGS: Clear lungs. Normal heart size and mediastinal contours. No pleural effusion or pneumothorax. Visualized bones and upper abdomen are unremarkable. IMPRESSION: No acute cardiopulmonary disease. Electronically Signed   By: Emmit Alexanders M.D.   On: 05/19/2022 16:06     Assessment/Plan: History of stones.  He had hydro on CT in January with AKI but the hydro resolved.    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  Possible bladder neoplasm.   The bladder lesion could have been edema around the right UO from a stone but I will have him return for a cystoscopy to assess.  Bacteiuria.  He has many bacteria today and has had aerococcus on 2 UCx's.  I will reculture today and treat prior to cystoscopy.   No orders of the defined types were placed in this encounter.    Orders Placed This Encounter  Procedures   Urine Culture   Microscopic Examination   Urinalysis, Routine w reflex microscopic     Return for Next available for cystoscopy.    CC: Dr. Rosita Fire.      Irine Seal 07/07/2022

## 2022-07-12 LAB — URINE CULTURE

## 2022-07-13 ENCOUNTER — Telehealth: Payer: Self-pay

## 2022-07-13 MED ORDER — AMOXICILLIN 500 MG PO CAPS: 500.0000 mg | ORAL_CAPSULE | Freq: Three times a day (TID) | ORAL | 0 refills | Status: DC

## 2022-07-13 NOTE — Telephone Encounter (Signed)
Patient aware of MD response and instructions for rx.  Rx sent to pharmacy.

## 2022-07-13 NOTE — Telephone Encounter (Signed)
-----   Message from Irine Seal, MD sent at 07/13/2022  9:25 AM EDT ----- He needs amoxicillin 500mg  po tid #60 but after the first week he just needs to take it once daily at bedtime until we get him back for cystoscopy.   ----- Message ----- From: Audie Box, CMA Sent: 07/13/2022   7:46 AM EDT To: Irine Seal, MD  Please review, no treatment started

## 2022-07-28 ENCOUNTER — Inpatient Hospital Stay: Payer: Medicaid Other

## 2022-07-28 VITALS — BP 138/80 | HR 69 | Temp 97.6°F | Resp 18

## 2022-07-28 DIAGNOSIS — E538 Deficiency of other specified B group vitamins: Secondary | ICD-10-CM

## 2022-07-28 MED ORDER — CYANOCOBALAMIN 1000 MCG/ML IJ SOLN
1000.0000 ug | Freq: Once | INTRAMUSCULAR | Status: AC
  Administered 2022-07-28: 1000 ug via INTRAMUSCULAR
  Filled 2022-07-28: qty 1

## 2022-07-28 NOTE — Patient Instructions (Signed)
[facility]  Discharge Instructions: Thank you for choosing [facility] to provide your oncology and hematology care.  If you have a lab appointment with the Cancer Center, please come in thru the Main Entrance and check in at the main information desk.  Wear comfortable clothing and clothing appropriate for easy access to any Portacath or PICC line.   We strive to give you quality time with your provider. You may need to reschedule your appointment if you arrive late (15 or more minutes).  Arriving late affects you and other patients whose appointments are after yours.  Also, if you miss three or more appointments without notifying the office, you may be dismissed from the clinic at the provider's discretion.      For prescription refill requests, have your pharmacy contact our office and allow 72 hours for refills to be completed.    Today you received B12 injection.     BELOW ARE SYMPTOMS THAT SHOULD BE REPORTED IMMEDIATELY: *FEVER GREATER THAN 100.4 F (38 C) OR HIGHER *CHILLS OR SWEATING *NAUSEA AND VOMITING THAT IS NOT CONTROLLED WITH YOUR NAUSEA MEDICATION *UNUSUAL SHORTNESS OF BREATH *UNUSUAL BRUISING OR BLEEDING *URINARY PROBLEMS (pain or burning when urinating, or frequent urination) *BOWEL PROBLEMS (unusual diarrhea, constipation, pain near the anus) TENDERNESS IN MOUTH AND THROAT WITH OR WITHOUT PRESENCE OF ULCERS (sore throat, sores in mouth, or a toothache) UNUSUAL RASH, SWELLING OR PAIN  UNUSUAL VAGINAL DISCHARGE OR ITCHING   Items with * indicate a potential emergency and should be followed up as soon as possible or go to the Emergency Department if any problems should occur.  Please show the CHEMOTHERAPY ALERT CARD or IMMUNOTHERAPY ALERT CARD at check-in to the Emergency Department and triage nurse.  Should you have questions after your visit or need to cancel or reschedule your appointment, please contact [facility] [phone]  and follow the prompts.  Office hours are 8:00 a.m. to 4:30 p.m. Monday - Friday. Please note that voicemails left after 4:00 p.m. may not be returned until the following business day.  We are closed weekends and major holidays. You have access to a nurse at all times for urgent questions. Please call the main number to the clinic [phone] and follow the prompts.  For any non-urgent questions, you may also contact your provider using MyChart. We now offer e-Visits for anyone 18 and older to request care online for non-urgent symptoms. For details visit [email or web address].   Also download the MyChart app! Go to the app store, search "MyChart", open the app, select [place], and log in with your MyChart username and password.   

## 2022-07-28 NOTE — Progress Notes (Signed)
[name] [name] presents today for injection per the provider's orders.  B12 administration without incident; injection site WNL; see MAR for injection details.  Patient tolerated procedure well and without incident.  No questions or complaints noted at this time.   Discharged from clinic ambulatory in stable condition. Alert and oriented x 3. F/U with [facility] as scheduled.

## 2022-07-31 ENCOUNTER — Other Ambulatory Visit (INDEPENDENT_AMBULATORY_CARE_PROVIDER_SITE_OTHER): Payer: Self-pay | Admitting: Gastroenterology

## 2022-07-31 ENCOUNTER — Ambulatory Visit (HOSPITAL_COMMUNITY)
Admission: RE | Admit: 2022-07-31 | Discharge: 2022-07-31 | Disposition: A | Discharge status: Home / Self Care | Payer: Medicaid Other | Source: Ambulatory Visit | Attending: Gastroenterology | Admitting: Gastroenterology

## 2022-07-31 DIAGNOSIS — K852 Alcohol induced acute pancreatitis without necrosis or infection: Secondary | ICD-10-CM

## 2022-07-31 DIAGNOSIS — K7030 Alcoholic cirrhosis of liver without ascites: Secondary | ICD-10-CM

## 2022-07-31 MED ORDER — GADOBUTROL 1 MMOL/ML IV SOLN
8.0000 mL | Freq: Once | INTRAVENOUS | Status: AC | PRN
  Administered 2022-07-31: 8 mL via INTRAVENOUS

## 2022-08-10 ENCOUNTER — Other Ambulatory Visit: Payer: Medicaid Other | Admitting: Urology

## 2022-08-12 ENCOUNTER — Other Ambulatory Visit: Payer: Self-pay | Admitting: Gastroenterology

## 2022-08-16 ENCOUNTER — Other Ambulatory Visit (INDEPENDENT_AMBULATORY_CARE_PROVIDER_SITE_OTHER): Payer: Self-pay | Admitting: Gastroenterology

## 2022-08-16 ENCOUNTER — Telehealth: Payer: Self-pay

## 2022-08-16 MED ORDER — SPIRONOLACTONE 50 MG PO TABS: 50.0000 mg | ORAL_TABLET | ORAL | 1 refills | Status: DC

## 2022-08-16 NOTE — Telephone Encounter (Signed)
FYI:  Phoned and advised the pt of his Rx being sent in and reminded him of his appt on May 14th @ 11:30. Pt expressed understanding

## 2022-08-16 NOTE — Telephone Encounter (Signed)
pt phoned requesting a refill on his Spironolactone. His last ov was 06/14/2022.

## 2022-08-25 ENCOUNTER — Inpatient Hospital Stay: Payer: Medicaid Other | Attending: Physician Assistant

## 2022-08-25 VITALS — BP 145/76 | HR 64 | Temp 97.0°F | Resp 17

## 2022-08-25 DIAGNOSIS — E538 Deficiency of other specified B group vitamins: Secondary | ICD-10-CM

## 2022-08-25 MED ORDER — CYANOCOBALAMIN 1000 MCG/ML IJ SOLN
1000.0000 ug | Freq: Once | INTRAMUSCULAR | Status: AC
  Administered 2022-08-25: 1000 ug via INTRAMUSCULAR
  Filled 2022-08-25: qty 1

## 2022-08-25 NOTE — Progress Notes (Signed)
[name] [name] presents today for injection per the provider's orders. B12 injection administration without incident; injection site WNL; see MAR for injection details.  Patient tolerated procedure well and without incident.  No questions or complaints noted at this time.   B12 given today per MD orders. Tolerated infusion without adverse affects. Vital signs stable. No complaints at this time. Discharged from clinic ambulatory with cane in stable condition. Alert and oriented x 3. F/U with [facility] as scheduled.

## 2022-08-25 NOTE — Patient Instructions (Signed)
[facility]  Discharge Instructions: Thank you for choosing [facility] to provide your oncology and hematology care.  If you have a lab appointment with the Cancer Center - please note that after April 8th, 2024, all labs will be drawn in the cancer center.  You do not have to check in or register with the main entrance as you have in the past but will complete your check-in in the cancer center.  Wear comfortable clothing and clothing appropriate for easy access to any Portacath or PICC line.   We strive to give you quality time with your provider. You may need to reschedule your appointment if you arrive late (15 or more minutes).  Arriving late affects you and other patients whose appointments are after yours.  Also, if you miss three or more appointments without notifying the office, you may be dismissed from the clinic at the provider's discretion.      For prescription refill requests, have your pharmacy contact our office and allow 72 hours for refills to be completed.    Today you received B12 injection.     BELOW ARE SYMPTOMS THAT SHOULD BE REPORTED IMMEDIATELY: *FEVER GREATER THAN 100.4 F (38 C) OR HIGHER *CHILLS OR SWEATING *NAUSEA AND VOMITING THAT IS NOT CONTROLLED WITH YOUR NAUSEA MEDICATION *UNUSUAL SHORTNESS OF BREATH *UNUSUAL BRUISING OR BLEEDING *URINARY PROBLEMS (pain or burning when urinating, or frequent urination) *BOWEL PROBLEMS (unusual diarrhea, constipation, pain near the anus) TENDERNESS IN MOUTH AND THROAT WITH OR WITHOUT PRESENCE OF ULCERS (sore throat, sores in mouth, or a toothache) UNUSUAL RASH, SWELLING OR PAIN  UNUSUAL VAGINAL DISCHARGE OR ITCHING   Items with * indicate a potential emergency and should be followed up as soon as possible or go to the Emergency Department if any problems should occur.  Please show the CHEMOTHERAPY ALERT CARD or IMMUNOTHERAPY ALERT CARD at check-in to the Emergency Department and triage  nurse.  Should you have questions after your visit or need to cancel or reschedule your appointment, please contact [facility] [phone]  and follow the prompts.  Office hours are 8:00 a.m. to 4:30 p.m. Monday - Friday. Please note that voicemails left after 4:00 p.m. may not be returned until the following business day.  We are closed weekends and major holidays. You have access to a nurse at all times for urgent questions. Please call the main number to the clinic [phone] and follow the prompts.  For any non-urgent questions, you may also contact your provider using MyChart. We now offer e-Visits for anyone 65 and older to request care online for non-urgent symptoms. For details visit [email or web address].   Also download the MyChart app! Go to the app store, search "MyChart", open the app, select [place], and log in with your MyChart username and password.

## 2022-09-11 NOTE — Progress Notes (Unsigned)
Gastroenterology Office Note     Primary Care Physician:  Benetta Spar, MD  Primary Gastroenterologist: Dr. Jena Gauss    Chief Complaint   Chief Complaint  Patient presents with   Follow-up    Follow up cirrhosis     History of Present Illness   [name] [name] is a 57 y.o. male presenting today in follow-up with a history of cirrhosis due to ETOH, followed by Liver clinic in [place] as well [name], FNP).  EGD May 2023 with normal esophagus but retained food in stomach and duodenum precluded exam, pancreatitis due to alcohol and gallstones Jan 2024, history of gallbladder polyp, returning for follow-up today.   Repeat MRI pancreas with resolution of pancreatitis.  Hepatic and splenic iron deposition noted. We are reaching out to Hematology about this. He has chronically elevated ferritin and iron sats previously felt to be due to liver disease and alcohol use. 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   Wants zofran refill just in case. No alcohol intake. Last intake about 5-6 months ago. No rectal bleeding. No confusion or mental status changes. Abdomen is feeling tight again. No added salt. Every now and then canned foods. No lower extremity edema. No abdominal pain. No overt GI bleeding. No mental status changes or confusion. He has been given Hep A and B prescription but hasn't completed yet.   Lasix 40 mg in am and 20mg  in pm Spironolactone 100 mg in am and 50 mg  in afternoon     EGD May 2023: normal esophagus but retained food in stomach and duodenum precluded exam. Surveillance 2025  Colonoscopy Dec 2023: pancolonic diverticulosis, multiple polyps, one which was 11 mm (tubular adenomas). Surveillance in 3 years.    Past Medical History:  Diagnosis Date   Asthma    B12 deficiency 03/15/2022   Cirrhosis (HCC)    Dyspnea    High cholesterol    History of kidney stones    Hypertension    Kidney stones      Past Surgical History:  Procedure Laterality Date   APPENDECTOMY     BIOPSY  07/22/2020   Procedure: BIOPSY;  Surgeon: Corbin Ade, MD;  [facility];  Service: Endoscopy;;   COLONOSCOPY WITH PROPOFOL N/A 04/18/2017   non-bleeding internal hemorrhoids, two 4-6 mm polyps in descending colon and cecum, pancolonic diverticulosis, single cecal AVM. Tubular adenomas, surveillance in 2023.    COLONOSCOPY WITH PROPOFOL N/A 04/13/2022   pancolonic diverticulosis, multiple polyps, one which was 11 mm (tubular adenomas). Surveillance in 3 years.   ESOPHAGOGASTRODUODENOSCOPY (EGD) WITH PROPOFOL N/A 07/22/2020   normal esophagus, small hiatal hernia, abnormal gastric mucosa, abnormal appearing ampula and periampullary mucosa. Mild chronic gastritis.   ESOPHAGOGASTRODUODENOSCOPY (EGD) WITH PROPOFOL N/A 09/08/2021   normal esophagus, retained food in stomach and duodenum precluded complete examination.   FRACTURE SURGERY     left arm   IR PARACENTESIS  05/31/2021   LOWER EXTREMITY VENOGRAPHY N/A 08/10/2020   Procedure: LOWER EXTREMITY VENOGRAPHY;  Surgeon: Maeola Harman, MD;  Location[facility] INVAS[facility] CV LAB;  Service: Cardiovascular;  Laterality: N/A;   PERIPHERAL VASCULAR BALLOON ANGIOPLASTY Left 08/10/2020   Procedure: PERIPHERAL VASCULAR BALLOON ANGIOPLASTY;  Surgeon: Maeola Harman, MD;  Location: [facility] INVASIVE CV LAB;  Service: Cardiovascular;  Laterality: Left;  lower extremity venous   PERIPHERAL VASCULAR THROMBECTOMY N/A 08/10/2020   Procedure: PERIPHERAL VASCULAR THROMBECTOMY;  Surgeon: Maeola Harman, MD;  Location:  [facility] INVASIVE CV LAB;  Service: Cardiovascular;  Laterality: N/A;   POLYPECTOMY  04/18/2017   Procedure: POLYPECTOMY;  Surgeon: Corbin Ade, MD;  [facility];  Service: Endoscopy;;   POLYPECTOMY  04/13/2022   Procedure: POLYPECTOMY;  Surgeon: Corbin Ade, MD;  [facility];  Service: Endoscopy;;     Current Outpatient Medications  Medication Sig Dispense Refill   acetaminophen (TYLENOL) 500 MG tablet Take 2,000 mg by mouth every 6 (six) hours as needed for moderate pain.     albuterol (PROVENTIL HFA) 108 (90 Base) MCG/ACT inhaler INHALE 2 PUFFS BY MOUTH EVERY 6 HOURS AS NEEDED FOR COUGHING, WHEEZING, OR SHORTNESS OF BREATH 18 g 1   aspirin 81 MG chewable tablet Chew 81 mg by mouth daily.     Cyanocobalamin (B-12 COMPLIANCE INJECTION IJ) Inject 1 Dose as directed every 30 (thirty) days.     furosemide (LASIX) 20 MG tablet TAKE 2 TABLETS BY MOUTH IN THE MORNING AND 1 TABLET IN THE AFTERNOON 90 tablet 3   Menthol-Methyl Salicylate (MUSCLE RUB) 10-15 % CREA Apply 1 Application topically as needed for muscle pain.     metoprolol tartrate (LOPRESSOR) 25 MG tablet Take 25 mg by mouth 2 (two) times daily.     mometasone-formoterol (DULERA) 200-5 MCG/ACT AERO Inhale 2 puffs into the lungs 2 (two) times daily. 13 g 3   pantoprazole (PROTONIX) 40 MG tablet Take 1 tablet (40 mg total) by mouth daily. 90 tablet 3   sodium bicarbonate 650 MG tablet Take 650 mg by mouth 2 (two) times daily.     spironolactone (ALDACTONE) 50 MG tablet Take 1-2 tablets (50-100 mg total) by mouth See admin instructions. Take 100 mg by mouth in morning and 50 mg in the afternoon 180 tablet 1   ondansetron (ZOFRAN-ODT) 4 MG disintegrating tablet Take 1 tablet (4 mg total) by mouth every 8 (eight) hours as needed. 30 tablet 1   No current facility-administered medications for this visit.    Allergies as of 09/12/2022   (No Known Allergies)    Family History  Problem Relation Age of Onset   Cancer Father        throat   Diabetes Sister    Colon cancer Neg Hx     Social History   Socioeconomic History   Marital status: Single    Spouse name: Not on file   Number of children: Not on file   Years of education: Not on file   Highest education level: Not on file  Occupational History   Not on file  Tobacco  Use   Smoking status: Every Day    Packs/day: 0.50    Years: 33.00    Additional pack years: 0.00    Total pack years: 16.50    Types: Cigarettes    Passive exposure: Current   Smokeless tobacco: Never  Vaping Use   Vaping Use: Never used  Substance and Sexual Activity   Alcohol use: Not Currently    Comment: last deink 1 week ago   Drug use: No   Sexual activity: Yes    Birth control/protection: None  Other Topics Concern   Not on file  Social History Narrative   Not on file   Social Determinants of Health   Financial Resource Strain: High Risk (09/13/2020)   Overall Financial Resource Strain (CARDIA)    Difficulty of Paying Living Expenses: Very hard  Food Insecurity: No Food Insecurity (05/20/2022)   Hunger Vital Sign  Worried About Programme researcher, broadcasting/film/video in the Last Year: Never true    Ran Out of Food in the Last Year: Never true  Transportation Needs: No Transportation Needs (05/20/2022)   PRAPARE - Administrator, Civil Service (Medical): No    Lack of Transportation (Non-Medical): No  Physical Activity: Sufficiently Active (09/13/2020)   Exercise Vital Sign    Days of Exercise per Week: 7 days    Minutes of Exercise per Session: 30 min  Stress: No Stress Concern Present (09/13/2020)   [employer] of Occupational Health - Occupational Stress Questionnaire    Feeling of Stress : Only a little  Social Connections: Moderately Isolated (09/13/2020)   Social Connection and Isolation Panel [NHANES]    Frequency of Communication with Friends and Family: More than three times a week    Frequency of Social Gatherings with Friends and Family: Once a week    Attends Religious Services: More than 4 times per year    Active Member of [employer] or Organizations: No    Attends Banker Meetings: Never    Marital Status: Never married  Intimate Partner Violence: Not At Risk (05/20/2022)   Humiliation, Afraid, Rape, and Kick questionnaire    Fear of Current  or Ex-Partner: No    Emotionally Abused: No    Physically Abused: No    Sexually Abused: No     Review of Systems   Gen: Denies any fever, chills, fatigue, weight loss, lack of appetite.  CV: Denies chest pain, heart palpitations, peripheral edema, syncope.  Resp: Denies shortness of breath at rest or with exertion. Denies wheezing or cough.  GI: Denies dysphagia or odynophagia. Denies jaundice, hematemesis, fecal incontinence. GU : Denies urinary burning, urinary frequency, urinary hesitancy MS: Denies joint pain, muscle weakness, cramps, or limitation of movement.  Derm: Denies rash, itching, dry skin Psych: Denies depression, anxiety, memory loss, and confusion Heme: Denies bruising, bleeding, and enlarged lymph nodes.   Physical Exam   BP 131/76   Pulse 64   Temp (!) 97.2 F (36.2 C)   Ht 6' (1.829 m)   Wt 180 lb 6.4 oz (81.8 kg)   BMI 24.47 kg/m  General:   Alert and oriented. Pleasant and cooperative. Well-nourished and well-developed.  Head:  Normocephalic and atraumatic. Eyes:  Without icterus Abdomen:  +BS, soft, liver margin palpable below costal margin and non-distended. No guarding or rebound. No masses appreciated. Full to palpation but no obvious ascites.  Rectal:  Deferred  Msk:  Symmetrical without gross deformities. Normal posture. Extremities:  Without edema. Neurologic:  Alert and  oriented x4;  grossly normal neurologically. Skin:  Intact without significant lesions or rashes. Psych:  Alert and cooperative. Normal mood and affect.   Assessment   [name] [name] is a 57 y.o. male presenting today in follow-up with a history of cirrhosis due to ETOH, followed by Liver clinic in [place] as well [name], FNP), pancreatitis in setting of alcohol +/- gallstones in Jan 2024, history of gallbladder polyp, adenomas, for routine follow-up today.  Cirrhosis: fairly well-compensated at this time. Due for MELD labs and AFP now. [place] due. He feels abdomen is  distended, but I do not appreciate significant ascites on exam. Will order para at time of [place] if notable ascites. Continue with Lasix 40 mg in am and 20 mg in pm, spironolactone 100 mg in am and 50 mg in pm. EGD 2025.  Pancreatitis:  Repeat MRI pancreas with resolution of  pancreatitis.  Hepatic and splenic iron deposition noted. We are reaching out to Hematology about this. He has chronically elevated ferritin and iron sats previously felt to be due to liver disease and alcohol use. 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   Gallbladder polyp: noted in the past. [place] in Jan 2024 without mention of this. Previously referred to Surgery in [place] by the liver clinic but has not completed appt. I do note a telephone note from Dec 2023 that the risks seemed to outweigh the benefits at that time.   Adenomas: surveillance in 2026.    PLAN    MELD labs and AFP ordered RUQ [place]. Para if ascites present: suspect will be minimal Continue current diuretic regimen I have reached out to Hematology regarding iron deposits Keep follow-up with Liver Clinic Needs Hep A/B vaccinations EGD in 2025 unless decompensating event in interim 2 gram sodium diet Avoid alcohol 3 month return   Gelene Mink, PhD, ANP-BC [facility]

## 2022-09-12 ENCOUNTER — Telehealth: Payer: Self-pay | Admitting: Gastroenterology

## 2022-09-12 ENCOUNTER — Telehealth: Payer: Self-pay | Admitting: *Deleted

## 2022-09-12 ENCOUNTER — Encounter: Payer: Self-pay | Admitting: Gastroenterology

## 2022-09-12 ENCOUNTER — Ambulatory Visit (INDEPENDENT_AMBULATORY_CARE_PROVIDER_SITE_OTHER): Payer: Medicaid Other | Admitting: Gastroenterology

## 2022-09-12 VITALS — BP 131/76 | HR 64 | Temp 97.2°F | Ht 72.0 in | Wt 180.4 lb

## 2022-09-12 DIAGNOSIS — K219 Gastro-esophageal reflux disease without esophagitis: Secondary | ICD-10-CM | POA: Diagnosis not present

## 2022-09-12 DIAGNOSIS — K7460 Unspecified cirrhosis of liver: Secondary | ICD-10-CM | POA: Diagnosis not present

## 2022-09-12 MED ORDER — ONDANSETRON 4 MG PO TBDP: 4.0000 mg | ORAL_TABLET | Freq: Three times a day (TID) | ORAL | 1 refills | Status: DC | PRN

## 2022-09-12 NOTE — Telephone Encounter (Signed)
Phoned the pt and his phone continuously rang with no vm/ans. Will try later

## 2022-09-12 NOTE — Patient Instructions (Signed)
We are arranging an ultrasound for your liver. They will remove fluid if needed at that time!  Please have blood work done.   Continue to avoid alcohol completely.   Continue to follow a low salt diet.  We will see you in 3 months!  I enjoyed seeing you again today! I value our relationship and want to provide genuine, compassionate, and quality care. You may receive a survey regarding your visit with me, and I welcome your feedback! Thanks so much for taking the time to complete this. I look forward to seeing you again.      Gelene Mink, PhD, ANP-BC [facility]

## 2022-09-12 NOTE — Telephone Encounter (Signed)
LMTCB for pt to advise os [place] appts for 5/17, arrival 7:45am, npo midnight for both appts

## 2022-09-12 NOTE — Telephone Encounter (Signed)
Can you please call patient and remind him to complete Hep A and B vaccinations? We had given a prescription in the past. IF he still needs that, let me know.

## 2022-09-13 ENCOUNTER — Other Ambulatory Visit: Payer: Self-pay | Admitting: Gastroenterology

## 2022-09-13 NOTE — Telephone Encounter (Signed)
Phoned the pt and the phone just rang. No vm/ans.

## 2022-09-13 NOTE — Telephone Encounter (Signed)
Pt returned the call and I advised him of the note. Pt states he has been to several pharmacies and his PCP and they are telling him that they cannot do it. I asked what was the reason and he said because his insurance will not pay for it. Please advise

## 2022-09-14 ENCOUNTER — Ambulatory Visit (INDEPENDENT_AMBULATORY_CARE_PROVIDER_SITE_OTHER): Payer: Medicaid Other | Admitting: Urology

## 2022-09-14 ENCOUNTER — Encounter: Payer: Self-pay | Admitting: Urology

## 2022-09-14 VITALS — BP 144/77 | HR 59

## 2022-09-14 DIAGNOSIS — N35116 Postinfective urethral stricture, not elsewhere classified, male, overlapping sites: Secondary | ICD-10-CM | POA: Diagnosis not present

## 2022-09-14 DIAGNOSIS — N261 Atrophy of kidney (terminal): Secondary | ICD-10-CM

## 2022-09-14 DIAGNOSIS — Z87440 Personal history of urinary (tract) infections: Secondary | ICD-10-CM

## 2022-09-14 DIAGNOSIS — Z87442 Personal history of urinary calculi: Secondary | ICD-10-CM

## 2022-09-14 DIAGNOSIS — R9341 Abnormal radiologic findings on diagnostic imaging of renal pelvis, ureter, or bladder: Secondary | ICD-10-CM

## 2022-09-14 LAB — URINALYSIS, ROUTINE W REFLEX MICROSCOPIC
Bilirubin, UA: NEGATIVE
Glucose, UA: NEGATIVE
Ketones, UA: NEGATIVE
Leukocytes,UA: NEGATIVE
Nitrite, UA: NEGATIVE
RBC, UA: NEGATIVE
Specific Gravity, UA: 1.015 (ref 1.005–1.030)
Urobilinogen, Ur: 0.2 mg/dL (ref 0.2–1.0)
pH, UA: 6 (ref 5.0–7.5)

## 2022-09-14 LAB — MICROSCOPIC EXAMINATION: Bacteria, UA: NONE SEEN

## 2022-09-14 MED ORDER — CIPROFLOXACIN HCL 500 MG PO TABS
500.0000 mg | ORAL_TABLET | Freq: Once | ORAL | Status: AC
Start: 2022-09-14 — End: 2022-09-14
  Administered 2022-09-14: 500 mg via ORAL

## 2022-09-14 NOTE — Progress Notes (Signed)
Subjective: 1. Abnormal radiologic findings on diagnostic imaging of renal pelvis, ureter, or bladder   2. Personal history of urinary infection   3. 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  His urine is clear following treatment with amoxicillin.  He reports he is voiding better and his IPSS is 11 with nocturia x 2.  He is for cystoscopy today to assess the possible bladder lesion seen on recent US.    [name] [name] is 57 yo male who has a history of stones with prior intervention in 2014 by Dr. Jerre Simon.  He was hospitalized in 1/24 with right flank pain and a CT showed right hydro with possible interval passage of a stone.   He was noted to have chronic cortical atrophy and full bladder but he subsequently voided.  He had an MRI of the abdomen a few days later and the hydronephrosis had resolved.  He has had no further pain.  He had a renal US in 11/23 that showed a sessile polypoid lesion in the bladder but that was not seen on CT.   He has had chronic pyuria and had yeast in the urine in 1/24.  He is voiding well with an IPSS of 4 and nocturia x 2.  He had AKI in the hospital but the Cr. Was down to 1.27 at DC.  His PSA has been slowly rising but was 2.8 in 2/23.  The doubling time is about 3 years.  He had aerococcus on his urine cultures on 05/19/22 and 03/02/22.  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  ROS:  Review of Systems  All other systems reviewed and are negative.   No Known Allergies  Past Medical History:  Diagnosis Date   Asthma    B12 deficiency 03/15/2022   Cirrhosis (HCC)    Dyspnea    High cholesterol    History of kidney stones    Hypertension    Kidney stones     Past Surgical History:  Procedure Laterality Date   APPENDECTOMY     BIOPSY  07/22/2020   Procedure: BIOPSY;  Surgeon: Corbin Ade, MD;  Location: [facility];  Service: Endoscopy;;    COLONOSCOPY WITH PROPOFOL N/A 04/18/2017   non-bleeding internal hemorrhoids, two 4-6 mm polyps in descending colon and cecum, pancolonic diverticulosis, single cecal AVM. Tubular adenomas, surveillance in 2023.    COLONOSCOPY WITH PROPOFOL N/A 04/13/2022   pancolonic diverticulosis, multiple polyps, one which was 11 mm (tubular adenomas). Surveillance in 3 years.   ESOPHAGOGASTRODUODENOSCOPY (EGD) WITH PROPOFOL N/A 07/22/2020   normal esophagus, small hiatal hernia, abnormal gastric mucosa, abnormal appearing ampula and periampullary mucosa. Mild chronic gastritis.   ESOPHAGOGASTRODUODENOSCOPY (EGD) WITH PROPOFOL N/A 09/08/2021   normal esophagus, retained food in stomach and duodenum precluded complete examination.   FRACTURE SURGERY     left arm   IR PARACENTESIS  05/31/2021   LOWER EXTREMITY VENOGRAPHY N/A 08/10/2020   Procedure: LOWER EXTREMITY VENOGRAPHY;  Surgeon: Maeola Harman, MD;  Location[facility] INVASIVE CV LAB;  Service: Cardiovascular;  Laterality: N/A;   PERIPHERAL VASCULAR BALLOON ANGIOPLASTY Left 08/10/2020   Procedure: PERIPHERAL VASCULAR BALLOON ANGIOPLASTY;  Surgeon: Maeola Harman, MD;  Location: [facility] INVASIVE CV LAB;  Service: Cardiovascular;  Laterality: Left;  lower extremity venous   PERIPHERAL VASCULAR THROMBECTOMY N/A 08/10/2020   Procedure: PERIPHERAL VASCULAR THROMBECTOMY;  Surgeon: Maeola Harman, MD;  Location: [facility] INVASIVE CV LAB;  Service: Cardiovascular;  Laterality: N/A;   POLYPECTOMY  04/18/2017   Procedure: POLYPECTOMY;  Surgeon: Corbin Ade, MD;  Location: [facility];  Service: Endoscopy;;   POLYPECTOMY  04/13/2022   Procedure: POLYPECTOMY;  Surgeon: Corbin Ade, MD;  Location: [facility];  Service: Endoscopy;;    Social History   Socioeconomic History   Marital status: Single    Spouse name: Not on file   Number of children: Not on file   Years of education: Not on file   Highest education level: Not on file   Occupational History   Not on file  Tobacco Use   Smoking status: Every Day    Packs/day: 0.50    Years: 33.00    Additional pack years: 0.00    Total pack years: 16.50    Types: Cigarettes    Passive exposure: Current   Smokeless tobacco: Never  Vaping Use   Vaping Use: Never used  Substance and Sexual Activity   Alcohol use: Not Currently    Comment: last deink 1 week ago   Drug use: No   Sexual activity: Yes    Birth control/protection: None  Other Topics Concern   Not on file  Social History Narrative   Not on file   Social Determinants of Health   Financial Resource Strain: High Risk (09/13/2020)   Overall Financial Resource Strain (CARDIA)    Difficulty of Paying Living Expenses: Very hard  Food Insecurity: No Food Insecurity (05/20/2022)   Hunger Vital Sign    Worried About Running Out of Food in the Last Year: Never true    Ran Out of Food in the Last Year: Never true  Transportation Needs: No Transportation Needs (05/20/2022)   PRAPARE - Administrator, Civil Service (Medical): No    Lack of Transportation (Non-Medical): No  Physical Activity: Sufficiently Active (09/13/2020)   Exercise Vital Sign    Days of Exercise per Week: 7 days    Minutes of Exercise per Session: 30 min  Stress: No Stress Concern Present (09/13/2020)   [employer] of Occupational Health - Occupational Stress Questionnaire    Feeling of Stress : Only a little  Social Connections: Moderately Isolated (09/13/2020)   Social Connection and Isolation Panel [NHANES]    Frequency of Communication with Friends and Family: More than three times a week    Frequency of Social Gatherings with Friends and Family: Once a week    Attends Religious Services: More than 4 times per year    Active Member of [employer] or Organizations: No    Attends Banker Meetings: Never    Marital Status: Never married  Intimate Partner Violence: Not At Risk (05/20/2022)   Humiliation, Afraid,  Rape, and Kick questionnaire    Fear of Current or Ex-Partner: No    Emotionally Abused: No    Physically Abused: No    Sexually Abused: No    Family History  Problem Relation Age of Onset   Cancer Father        throat   Diabetes Sister    Colon cancer Neg Hx     Anti-infectives: Anti-infectives (From admission, onward)    Start     Dose/Rate Route Frequency Ordered Stop   09/14/22 0915  CIPROFLOXACIN HCL 500 MG PO TABS        500 mg Oral  Once 09/14/22 0912 09/14/22 0945  Current Outpatient Medications  Medication Sig Dispense Refill   acetaminophen (TYLENOL) 500 MG tablet Take 2,000 mg by mouth every 6 (six) hours as needed for moderate pain.     albuterol (PROVENTIL HFA) 108 (90 Base) MCG/ACT inhaler INHALE 2 PUFFS BY MOUTH EVERY 6 HOURS AS NEEDED FOR COUGHING, WHEEZING, OR SHORTNESS OF BREATH 18 g 1   aspirin 81 MG chewable tablet Chew 81 mg by mouth daily.     Cyanocobalamin (B-12 COMPLIANCE INJECTION IJ) Inject 1 Dose as directed every 30 (thirty) days.     furosemide (LASIX) 20 MG tablet TAKE 2 TABLETS BY MOUTH IN THE MORNING AND 1 TABLET IN THE AFTERNOON 90 tablet 3   Menthol-Methyl Salicylate (MUSCLE RUB) 10-15 % CREA Apply 1 Application topically as needed for muscle pain.     metoprolol tartrate (LOPRESSOR) 25 MG tablet Take 25 mg by mouth 2 (two) times daily.     mometasone-formoterol (DULERA) 200-5 MCG/ACT AERO Inhale 2 puffs into the lungs 2 (two) times daily. 13 g 3   ondansetron (ZOFRAN-ODT) 4 MG disintegrating tablet Take 1 tablet (4 mg total) by mouth every 8 (eight) hours as needed. 30 tablet 1   sodium bicarbonate 650 MG tablet Take 650 mg by mouth 2 (two) times daily.     spironolactone (ALDACTONE) 50 MG tablet Take 1-2 tablets (50-100 mg total) by mouth See admin instructions. Take 100 mg by mouth in morning and 50 mg in the afternoon 180 tablet 1   pantoprazole (PROTONIX) 40 MG tablet Take 1 tablet (40 mg total) by mouth daily. 90 tablet 3   No  current facility-administered medications for this visit.     Objective: Vital signs in last 24 hours: BP (!) 144/77   Pulse (!) 59   Intake/Output from previous day: No intake/output data recorded. Intake/Output this shift: @IOTHISSHIFT @   Physical Exam Vitals reviewed.  Constitutional:      Appearance: Normal appearance.  Neurological:     Mental Status: He is alert.     Lab Results:  No results found for this or any previous visit (from the past 24 hour(s)).   BMET Recent Labs    09/15/22 0818  NA 136  K 4.9  CL 103  CO2 24  GLUCOSE 108*  BUN 39*  CREATININE 2.86*  CALCIUM 8.7*   PT/INR Recent Labs    09/15/22 0818  LABPROT 14.7  INR 1.1   ABG No results for input(s): "PHART", "HCO3" in the last 72 hours.  Invalid input(s): "PCO2", "PO2" .resul Studies/Results: US Abdomen Limited RUQ (LIVER/GB)  Result Date: 09/15/2022 CLINICAL DATA:  Cirrhosis follow-up. EXAM: ULTRASOUND ABDOMEN LIMITED RIGHT UPPER QUADRANT COMPARISON:  Ultrasound May 22, 2022. MRI of the abdomen July 31, 2022. FINDINGS: Gallbladder: Gallbladder wall thickening measuring 4.6 mm is identified. Trace pericholecystic fluid. No Murphy's sign. Cholelithiasis noted. Common bile duct: Diameter: 6.9 mm today versus 5 mm previously Liver: Increased echogenicity. No focal mass. Portal vein is patent on color Doppler imaging with normal direction of blood flow towards the liver. Other: None. IMPRESSION: 1. Cholelithiasis with gallbladder wall thickening and trace pericholecystic fluid. No Murphy's sign. The findings are nonspecific. If there is concern for cholecystitis, recommend a HIDA scan if the clinical picture is ambiguous. 2. The common bile duct is slightly larger today measuring 6.9 mm versus 5 mm previously. Recommend correlation with LFTs. If there is concern for biliary obstruction, recommend MRCP. 3. Increased echogenicity in the liver is nonspecific but could be related to the  reported history of cirrhosis or iron deposition in the liver described on the comparison MRI. Electronically Signed   By: Gerome Sam III M.D.   On: 09/15/2022 09:06   [place] ASCITES (ABDOMEN LIMITED)  Result Date: 09/15/2022 CLINICAL DATA:  Cirrhosis follow-up. EXAM: ULTRASOUND ABDOMEN LIMITED RIGHT UPPER QUADRANT COMPARISON:  Ultrasound May 22, 2022. MRI of the abdomen July 31, 2022. FINDINGS: Gallbladder: Gallbladder wall thickening measuring 4.6 mm is identified. Trace pericholecystic fluid. No Murphy's sign. Cholelithiasis noted. Common bile duct: Diameter: 6.9 mm today versus 5 mm previously Liver: Increased echogenicity. No focal mass. Portal vein is patent on color Doppler imaging with normal direction of blood flow towards the liver. Other: None. IMPRESSION: 1. Cholelithiasis with gallbladder wall thickening and trace pericholecystic fluid. No Murphy's sign. The findings are nonspecific. If there is concern for cholecystitis, recommend a HIDA scan if the clinical picture is ambiguous. 2. The common bile duct is slightly larger today measuring 6.9 mm versus 5 mm previously. Recommend correlation with LFTs. If there is concern for biliary obstruction, recommend MRCP. 3. Increased echogenicity in the liver is nonspecific but could be related to the reported history of cirrhosis or iron deposition in the liver described on the comparison MRI. Electronically Signed   By: Gerome Sam III M.D.   On: 09/15/2022 09:06   US Abdomen Limited RUQ (LIVER/GB)  Result Date: 09/15/2022 CLINICAL DATA:  Cirrhosis follow-up. EXAM: ULTRASOUND ABDOMEN LIMITED RIGHT UPPER QUADRANT COMPARISON:  Ultrasound May 22, 2022. MRI of the abdomen July 31, 2022. FINDINGS: Gallbladder: Gallbladder wall thickening measuring 4.6 mm is identified. Trace pericholecystic fluid. No Murphy's sign. Cholelithiasis noted. Common bile duct: Diameter: 6.9 mm today versus 5 mm previously Liver: Increased echogenicity. No focal mass.  Portal vein is patent on color Doppler imaging with normal direction of blood flow towards the liver. Other: None. IMPRESSION: 1. Cholelithiasis with gallbladder wall thickening and trace pericholecystic fluid. No Murphy's sign. The findings are nonspecific. If there is concern for cholecystitis, recommend a HIDA scan if the clinical picture is ambiguous. 2. The common bile duct is slightly larger today measuring 6.9 mm versus 5 mm previously. Recommend correlation with LFTs. If there is concern for biliary obstruction, recommend MRCP. 3. Increased echogenicity in the liver is nonspecific but could be related to the reported history of cirrhosis or iron deposition in the liver described on the comparison MRI. Electronically Signed   By: Gerome Sam III M.D.   On: 09/15/2022 09:06   [place] ASCITES (ABDOMEN LIMITED)  Result Date: 09/15/2022 CLINICAL DATA:  Cirrhosis follow-up. EXAM: ULTRASOUND ABDOMEN LIMITED RIGHT UPPER QUADRANT COMPARISON:  Ultrasound May 22, 2022. MRI of the abdomen July 31, 2022. FINDINGS: Gallbladder: Gallbladder wall thickening measuring 4.6 mm is identified. Trace pericholecystic fluid. No Murphy's sign. Cholelithiasis noted. Common bile duct: Diameter: 6.9 mm today versus 5 mm previously Liver: Increased echogenicity. No focal mass. Portal vein is patent on color Doppler imaging with normal direction of blood flow towards the liver. Other: None. IMPRESSION: 1. Cholelithiasis with gallbladder wall thickening and trace pericholecystic fluid. No Murphy's sign. The findings are nonspecific. If there is concern for cholecystitis, recommend a HIDA scan if the clinical picture is ambiguous. 2. The common bile duct is slightly larger today measuring 6.9 mm versus 5 mm previously. Recommend correlation with LFTs. If there is concern for biliary obstruction, recommend MRCP. 3. Increased echogenicity in the liver is nonspecific but could be related to the reported history of cirrhosis or iron  deposition in the  liver described on the comparison MRI. Electronically Signed   By: Gerome Sam III M.D.   On: 09/15/2022 09:06   MR Abdomen W Wo Contrast  Result Date: 07/31/2022 CLINICAL DATA:  Follow-up pancreatitis, suspected pseudocyst on prior examination EXAM: MRI ABDOMEN WITHOUT AND WITH CONTRAST TECHNIQUE: Multiplanar multisequence MR imaging of the abdomen was performed both before and after the administration of intravenous contrast. CONTRAST:  8mL GADAVIST GADOBUTROL 1 MMOL/ML IV SOLN COMPARISON:  MR abdomen, 05/23/2022 FINDINGS: Lower chest: No acute abnormality. Hepatobiliary: No solid liver abnormality is seen. Signal inversion of the liver parenchyma on in and opposed phase imaging in keeping with iron deposition. Tiny gallstones. Contracted gallbladder with mild gallbladder wall thickening. No choledocholithiasis or biliary ductal dilatation. Pancreas: No acute inflammatory findings. No pancreatic ductal dilatation or surrounding inflammatory changes. Spleen: Signal inversion of the splenic parenchyma on in and opposed phase imaging in keeping with iron deposition. Adrenals/Urinary Tract: Adrenal glands are unremarkable. Atrophic left kidney with multiple small benign parapelvic cysts, for which no further follow-up or characterization is required. The right kidney is normal, without obvious renal calculi, solid lesion, or hydronephrosis. Stomach/Bowel: Stomach is within normal limits. No evidence of bowel wall thickening, distention, or inflammatory changes. Vascular/Lymphatic: Aortic atherosclerosis. No enlarged abdominal lymph nodes. Other: No abdominal wall hernia or abnormality. No ascites. Musculoskeletal: No acute or significant osseous findings. IMPRESSION: 1. No acute inflammatory findings of the pancreas. Interval resolution of a previously identified abnormality in the ventral pancreas. 2. Tiny gallstones. Contracted gallbladder with mild gallbladder wall thickening. No  choledocholithiasis or biliary ductal dilatation. 3. Hepatic and splenic iron deposition. 4. Atrophic left kidney.  No hydronephrosis. Electronically Signed   By: Jearld Lesch M.D.   On: 07/31/2022 15:55   MR 3D Recon At Scanner  Result Date: 07/31/2022 CLINICAL DATA:  Follow-up pancreatitis, suspected pseudocyst on prior examination EXAM: MRI ABDOMEN WITHOUT AND WITH CONTRAST TECHNIQUE: Multiplanar multisequence MR imaging of the abdomen was performed both before and after the administration of intravenous contrast. CONTRAST:  8mL GADAVIST GADOBUTROL 1 MMOL/ML IV SOLN COMPARISON:  MR abdomen, 05/23/2022 FINDINGS: Lower chest: No acute abnormality. Hepatobiliary: No solid liver abnormality is seen. Signal inversion of the liver parenchyma on in and opposed phase imaging in keeping with iron deposition. Tiny gallstones. Contracted gallbladder with mild gallbladder wall thickening. No choledocholithiasis or biliary ductal dilatation. Pancreas: No acute inflammatory findings. No pancreatic ductal dilatation or surrounding inflammatory changes. Spleen: Signal inversion of the splenic parenchyma on in and opposed phase imaging in keeping with iron deposition. Adrenals/Urinary Tract: Adrenal glands are unremarkable. Atrophic left kidney with multiple small benign parapelvic cysts, for which no further follow-up or characterization is required. The right kidney is normal, without obvious renal calculi, solid lesion, or hydronephrosis. Stomach/Bowel: Stomach is within normal limits. No evidence of bowel wall thickening, distention, or inflammatory changes. Vascular/Lymphatic: Aortic atherosclerosis. No enlarged abdominal lymph nodes. Other: No abdominal wall hernia or abnormality. No ascites. Musculoskeletal: No acute or significant osseous findings. IMPRESSION: 1. No acute inflammatory findings of the pancreas. Interval resolution of a previously identified abnormality in the ventral pancreas. 2. Tiny gallstones.  Contracted gallbladder with mild gallbladder wall thickening. No choledocholithiasis or biliary ductal dilatation. 3. Hepatic and splenic iron deposition. 4. Atrophic left kidney.  No hydronephrosis. Electronically Signed   By: Jearld Lesch M.D.   On: 07/31/2022 15:55    Procedure: Flexible cystoscopy.  He was prepped with betadine and his urethra was instilled with 2% lidocaine jelly.  He  was given Cipro 500mg  po at the end of the procedure.  The urethra exhibited panurethral stricture disease that was worst in the proximal bulb but the scope was passable with effort.  The prostate was short with minimal hyperplasia and mild obstruction.  The bladder had mild trabeculation without mucosal lesions or stones.  The UO's were normal.    Assessment/Plan: History of stones.  He had hydro on CT in January with AKI but the hydro resolved.    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  Possible bladder neoplasm.  Cystoscopy shows no lesions.  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  His urine is clear after treatment but I will have him return in 3 months to check the urine.     Meds ordered this encounter  Medications   ciprofloxacin (CIPRO) tablet 500 mg     Orders Placed This Encounter  Procedures   Microscopic Examination   Urinalysis, Routine w reflex microscopic   Cystoscopy     Return in about 3 months (around 12/15/2022).    CC: Dr. Avon Gully.      Bjorn Pippin 09/15/2022

## 2022-09-15 ENCOUNTER — Encounter: Payer: Self-pay | Admitting: Urology

## 2022-09-15 ENCOUNTER — Other Ambulatory Visit (HOSPITAL_COMMUNITY)
Admission: RE | Admit: 2022-09-15 | Discharge: 2022-09-15 | Disposition: A | Discharge status: Home / Self Care | Payer: Medicaid Other | Source: Ambulatory Visit | Attending: Gastroenterology | Admitting: Gastroenterology

## 2022-09-15 ENCOUNTER — Ambulatory Visit (HOSPITAL_COMMUNITY)
Admission: RE | Admit: 2022-09-15 | Discharge: 2022-09-15 | Disposition: A | Discharge status: Home / Self Care | Payer: Medicaid Other | Source: Ambulatory Visit | Attending: Gastroenterology | Admitting: Gastroenterology

## 2022-09-15 ENCOUNTER — Other Ambulatory Visit: Payer: Self-pay | Admitting: Gastroenterology

## 2022-09-15 DIAGNOSIS — K7460 Unspecified cirrhosis of liver: Secondary | ICD-10-CM

## 2022-09-15 DIAGNOSIS — K219 Gastro-esophageal reflux disease without esophagitis: Secondary | ICD-10-CM

## 2022-09-15 LAB — COMPREHENSIVE METABOLIC PANEL
ALT: 13 U/L (ref 0–44)
AST: 22 U/L (ref 15–41)
Albumin: 3 g/dL — ABNORMAL LOW (ref 3.5–5.0)
Alkaline Phosphatase: 113 U/L (ref 38–126)
Anion gap: 9 (ref 5–15)
BUN: 39 mg/dL — ABNORMAL HIGH (ref 6–20)
CO2: 24 mmol/L (ref 22–32)
Calcium: 8.7 mg/dL — ABNORMAL LOW (ref 8.9–10.3)
Chloride: 103 mmol/L (ref 98–111)
Creatinine, Ser: 2.86 mg/dL — ABNORMAL HIGH (ref 0.61–1.24)
GFR, Estimated: 25 mL/min — ABNORMAL LOW (ref >60)
Glucose, Bld: 108 mg/dL — ABNORMAL HIGH (ref 70–99)
Potassium: 4.9 mmol/L (ref 3.5–5.1)
Sodium: 136 mmol/L (ref 135–145)
Total Bilirubin: 0.5 mg/dL (ref 0.3–1.2)
Total Protein: 8.3 g/dL — ABNORMAL HIGH (ref 6.5–8.1)

## 2022-09-15 LAB — CBC WITH DIFFERENTIAL/PLATELET
Abs Immature Granulocytes: 0.02 10*3/uL (ref 0.00–0.07)
Basophils Absolute: 0.1 10*3/uL (ref 0.0–0.1)
Basophils Relative: 1 %
Eosinophils Absolute: 0.2 10*3/uL (ref 0.0–0.5)
Eosinophils Relative: 2 %
HCT: 40.1 % (ref 39.0–52.0)
Hemoglobin: 12.8 g/dL — ABNORMAL LOW (ref 13.0–17.0)
Immature Granulocytes: 0 %
Lymphocytes Relative: 20 %
Lymphs Abs: 1.9 10*3/uL (ref 0.7–4.0)
MCH: 29.1 pg (ref 26.0–34.0)
MCHC: 31.9 g/dL (ref 30.0–36.0)
MCV: 91.1 fL (ref 80.0–100.0)
Monocytes Absolute: 1.2 10*3/uL — ABNORMAL HIGH (ref 0.1–1.0)
Monocytes Relative: 13 %
Neutro Abs: 5.8 10*3/uL (ref 1.7–7.7)
Neutrophils Relative %: 64 %
Platelets: 343 10*3/uL (ref 150–400)
RBC: 4.4 MIL/uL (ref 4.22–5.81)
RDW: 14 % (ref 11.5–15.5)
WBC: 9.2 10*3/uL (ref 4.0–10.5)
nRBC: 0 % (ref 0.0–0.2)

## 2022-09-15 LAB — PROTIME-INR
INR: 1.1 (ref 0.8–1.2)
Prothrombin Time: 14.7 seconds (ref 11.4–15.2)

## 2022-09-16 LAB — AFP TUMOR MARKER: AFP, Serum, Tumor Marker: <1.8 ng/mL (ref 0.0–8.4)

## 2022-09-22 ENCOUNTER — Inpatient Hospital Stay (HOSPITAL_COMMUNITY)
Admission: EM | Admit: 2022-09-22 | Discharge: 2022-09-25 | DRG: 683 | Disposition: A | Discharge status: Home / Self Care | Payer: Medicaid Other | Attending: Internal Medicine | Admitting: Internal Medicine

## 2022-09-22 ENCOUNTER — Inpatient Hospital Stay: Payer: Medicaid Other

## 2022-09-22 ENCOUNTER — Encounter (HOSPITAL_COMMUNITY): Payer: Self-pay | Admitting: Emergency Medicine

## 2022-09-22 ENCOUNTER — Telehealth: Payer: Self-pay

## 2022-09-22 ENCOUNTER — Inpatient Hospital Stay: Payer: Medicaid Other | Attending: Physician Assistant

## 2022-09-22 ENCOUNTER — Other Ambulatory Visit (HOSPITAL_COMMUNITY)
Admission: RE | Admit: 2022-09-22 | Discharge: 2022-09-22 | Disposition: A | Discharge status: Home / Self Care | Payer: Medicaid Other | Source: Ambulatory Visit | Attending: Gastroenterology | Admitting: Gastroenterology

## 2022-09-22 ENCOUNTER — Other Ambulatory Visit: Payer: Self-pay

## 2022-09-22 VITALS — BP 129/71 | HR 66 | Temp 97.9°F | Resp 18

## 2022-09-22 DIAGNOSIS — F17210 Nicotine dependence, cigarettes, uncomplicated: Secondary | ICD-10-CM | POA: Diagnosis not present

## 2022-09-22 DIAGNOSIS — N320 Bladder-neck obstruction: Secondary | ICD-10-CM

## 2022-09-22 DIAGNOSIS — E7800 Pure hypercholesterolemia, unspecified: Secondary | ICD-10-CM | POA: Diagnosis present

## 2022-09-22 DIAGNOSIS — J4489 Other specified chronic obstructive pulmonary disease: Secondary | ICD-10-CM | POA: Diagnosis present

## 2022-09-22 DIAGNOSIS — I700 Atherosclerosis of aorta: Secondary | ICD-10-CM | POA: Diagnosis present

## 2022-09-22 DIAGNOSIS — R768 Other specified abnormal immunological findings in serum: Secondary | ICD-10-CM | POA: Diagnosis not present

## 2022-09-22 DIAGNOSIS — E538 Deficiency of other specified B group vitamins: Secondary | ICD-10-CM | POA: Insufficient documentation

## 2022-09-22 DIAGNOSIS — Z833 Family history of diabetes mellitus: Secondary | ICD-10-CM

## 2022-09-22 DIAGNOSIS — Z86718 Personal history of other venous thrombosis and embolism: Secondary | ICD-10-CM | POA: Insufficient documentation

## 2022-09-22 DIAGNOSIS — D649 Anemia, unspecified: Secondary | ICD-10-CM

## 2022-09-22 DIAGNOSIS — K7030 Alcoholic cirrhosis of liver without ascites: Secondary | ICD-10-CM | POA: Diagnosis not present

## 2022-09-22 DIAGNOSIS — K5730 Diverticulosis of large intestine without perforation or abscess without bleeding: Secondary | ICD-10-CM | POA: Insufficient documentation

## 2022-09-22 DIAGNOSIS — K7460 Unspecified cirrhosis of liver: Secondary | ICD-10-CM | POA: Diagnosis present

## 2022-09-22 DIAGNOSIS — F17200 Nicotine dependence, unspecified, uncomplicated: Secondary | ICD-10-CM | POA: Diagnosis present

## 2022-09-22 DIAGNOSIS — G629 Polyneuropathy, unspecified: Secondary | ICD-10-CM | POA: Insufficient documentation

## 2022-09-22 DIAGNOSIS — N261 Atrophy of kidney (terminal): Secondary | ICD-10-CM | POA: Diagnosis present

## 2022-09-22 DIAGNOSIS — N1832 Chronic kidney disease, stage 3b: Secondary | ICD-10-CM | POA: Insufficient documentation

## 2022-09-22 DIAGNOSIS — N179 Acute kidney failure, unspecified: Principal | ICD-10-CM | POA: Diagnosis present

## 2022-09-22 DIAGNOSIS — J449 Chronic obstructive pulmonary disease, unspecified: Secondary | ICD-10-CM | POA: Diagnosis present

## 2022-09-22 DIAGNOSIS — I129 Hypertensive chronic kidney disease with stage 1 through stage 4 chronic kidney disease, or unspecified chronic kidney disease: Secondary | ICD-10-CM | POA: Diagnosis not present

## 2022-09-22 DIAGNOSIS — K219 Gastro-esophageal reflux disease without esophagitis: Secondary | ICD-10-CM | POA: Diagnosis present

## 2022-09-22 DIAGNOSIS — R7989 Other specified abnormal findings of blood chemistry: Secondary | ICD-10-CM

## 2022-09-22 DIAGNOSIS — Z716 Tobacco abuse counseling: Secondary | ICD-10-CM

## 2022-09-22 DIAGNOSIS — Z800 Family history of malignant neoplasm of digestive organs: Secondary | ICD-10-CM | POA: Insufficient documentation

## 2022-09-22 DIAGNOSIS — E559 Vitamin D deficiency, unspecified: Secondary | ICD-10-CM

## 2022-09-22 DIAGNOSIS — N138 Other obstructive and reflux uropathy: Secondary | ICD-10-CM | POA: Diagnosis present

## 2022-09-22 DIAGNOSIS — Z87442 Personal history of urinary calculi: Secondary | ICD-10-CM

## 2022-09-22 DIAGNOSIS — Z8719 Personal history of other diseases of the digestive system: Secondary | ICD-10-CM | POA: Diagnosis not present

## 2022-09-22 DIAGNOSIS — Z7982 Long term (current) use of aspirin: Secondary | ICD-10-CM

## 2022-09-22 DIAGNOSIS — N136 Pyonephrosis: Secondary | ICD-10-CM | POA: Diagnosis present

## 2022-09-22 DIAGNOSIS — Z79899 Other long term (current) drug therapy: Secondary | ICD-10-CM

## 2022-09-22 DIAGNOSIS — K8020 Calculus of gallbladder without cholecystitis without obstruction: Secondary | ICD-10-CM | POA: Diagnosis present

## 2022-09-22 DIAGNOSIS — Z7951 Long term (current) use of inhaled steroids: Secondary | ICD-10-CM

## 2022-09-22 DIAGNOSIS — K709 Alcoholic liver disease, unspecified: Secondary | ICD-10-CM | POA: Diagnosis present

## 2022-09-22 DIAGNOSIS — E872 Acidosis, unspecified: Secondary | ICD-10-CM

## 2022-09-22 DIAGNOSIS — N401 Enlarged prostate with lower urinary tract symptoms: Secondary | ICD-10-CM | POA: Diagnosis present

## 2022-09-22 LAB — COMPREHENSIVE METABOLIC PANEL
ALT: 14 U/L (ref 0–44)
ALT: 14 U/L (ref 0–44)
AST: 27 U/L (ref 15–41)
AST: 27 U/L (ref 15–41)
Albumin: 3.1 g/dL — ABNORMAL LOW (ref 3.5–5.0)
Albumin: 3 g/dL — ABNORMAL LOW (ref 3.5–5.0)
Alkaline Phosphatase: 124 U/L (ref 38–126)
Alkaline Phosphatase: 120 U/L (ref 38–126)
Anion gap: 9 (ref 5–15)
Anion gap: 10 (ref 5–15)
BUN: 38 mg/dL — ABNORMAL HIGH (ref 6–20)
BUN: 38 mg/dL — ABNORMAL HIGH (ref 6–20)
CO2: 23 mmol/L (ref 22–32)
CO2: 20 mmol/L — ABNORMAL LOW (ref 22–32)
Calcium: 8.5 mg/dL — ABNORMAL LOW (ref 8.9–10.3)
Calcium: 8.3 mg/dL — ABNORMAL LOW (ref 8.9–10.3)
Chloride: 102 mmol/L (ref 98–111)
Chloride: 104 mmol/L (ref 98–111)
Creatinine, Ser: 3.56 mg/dL — ABNORMAL HIGH (ref 0.61–1.24)
Creatinine, Ser: 3.44 mg/dL — ABNORMAL HIGH (ref 0.61–1.24)
GFR, Estimated: 19 mL/min — ABNORMAL LOW (ref >60)
GFR, Estimated: 20 mL/min — ABNORMAL LOW (ref >60)
Glucose, Bld: 94 mg/dL (ref 70–99)
Glucose, Bld: 129 mg/dL — ABNORMAL HIGH (ref 70–99)
Potassium: 4.4 mmol/L (ref 3.5–5.1)
Potassium: 4 mmol/L (ref 3.5–5.1)
Sodium: 134 mmol/L — ABNORMAL LOW (ref 135–145)
Sodium: 134 mmol/L — ABNORMAL LOW (ref 135–145)
Total Bilirubin: 0.4 mg/dL (ref 0.3–1.2)
Total Bilirubin: 0.3 mg/dL (ref 0.3–1.2)
Total Protein: 8.5 g/dL — ABNORMAL HIGH (ref 6.5–8.1)
Total Protein: 8.3 g/dL — ABNORMAL HIGH (ref 6.5–8.1)

## 2022-09-22 LAB — CBC WITH DIFFERENTIAL/PLATELET
Abs Immature Granulocytes: 0.03 10*3/uL (ref 0.00–0.07)
Abs Immature Granulocytes: 0.01 10*3/uL (ref 0.00–0.07)
Basophils Absolute: 0.1 10*3/uL (ref 0.0–0.1)
Basophils Absolute: 0.1 10*3/uL (ref 0.0–0.1)
Basophils Relative: 1 %
Basophils Relative: 1 %
Eosinophils Absolute: 0.2 10*3/uL (ref 0.0–0.5)
Eosinophils Absolute: 0.2 10*3/uL (ref 0.0–0.5)
Eosinophils Relative: 3 %
Eosinophils Relative: 3 %
HCT: 40.8 % (ref 39.0–52.0)
HCT: 39.2 % (ref 39.0–52.0)
Hemoglobin: 12.9 g/dL — ABNORMAL LOW (ref 13.0–17.0)
Hemoglobin: 12.6 g/dL — ABNORMAL LOW (ref 13.0–17.0)
Immature Granulocytes: 0 %
Immature Granulocytes: 0 %
Lymphocytes Relative: 24 %
Lymphocytes Relative: 23 %
Lymphs Abs: 2 10*3/uL (ref 0.7–4.0)
Lymphs Abs: 1.8 10*3/uL (ref 0.7–4.0)
MCH: 28.7 pg (ref 26.0–34.0)
MCH: 29 pg (ref 26.0–34.0)
MCHC: 31.6 g/dL (ref 30.0–36.0)
MCHC: 32.1 g/dL (ref 30.0–36.0)
MCV: 90.7 fL (ref 80.0–100.0)
MCV: 90.1 fL (ref 80.0–100.0)
Monocytes Absolute: 1.3 10*3/uL — ABNORMAL HIGH (ref 0.1–1.0)
Monocytes Absolute: 1 10*3/uL (ref 0.1–1.0)
Monocytes Relative: 15 %
Monocytes Relative: 13 %
Neutro Abs: 4.9 10*3/uL (ref 1.7–7.7)
Neutro Abs: 4.8 10*3/uL (ref 1.7–7.7)
Neutrophils Relative %: 57 %
Neutrophils Relative %: 60 %
Platelets: 347 10*3/uL (ref 150–400)
Platelets: 320 10*3/uL (ref 150–400)
RBC: 4.5 MIL/uL (ref 4.22–5.81)
RBC: 4.35 MIL/uL (ref 4.22–5.81)
RDW: 14 % (ref 11.5–15.5)
RDW: 13.6 % (ref 11.5–15.5)
WBC: 8.5 10*3/uL (ref 4.0–10.5)
WBC: 8 10*3/uL (ref 4.0–10.5)
nRBC: 0 % (ref 0.0–0.2)
nRBC: 0 % (ref 0.0–0.2)

## 2022-09-22 LAB — IRON AND TIBC
Iron: 41 ug/dL — ABNORMAL LOW (ref 45–182)
Saturation Ratios: 15 % — ABNORMAL LOW (ref 17.9–39.5)
TIBC: 273 ug/dL (ref 250–450)
UIBC: 232 ug/dL

## 2022-09-22 LAB — PROTIME-INR
INR: 1.1 (ref 0.8–1.2)
Prothrombin Time: 14.8 seconds (ref 11.4–15.2)

## 2022-09-22 LAB — FOLATE: Folate: 13.2 ng/mL (ref >5.9)

## 2022-09-22 LAB — VITAMIN B12: Vitamin B-12: 687 pg/mL (ref 180–914)

## 2022-09-22 LAB — FERRITIN: Ferritin: 217 ng/mL (ref 24–336)

## 2022-09-22 MED ORDER — CYANOCOBALAMIN 1000 MCG/ML IJ SOLN
1000.0000 ug | Freq: Once | INTRAMUSCULAR | Status: AC
  Administered 2022-09-22: 1000 ug via INTRAMUSCULAR
  Filled 2022-09-22: qty 1

## 2022-09-22 NOTE — ED Triage Notes (Signed)
Pt came for eval after GI doctor called to let him know his creatinine was extremely elevated and that he should come to ER. Pt is asymptomatic and denies renal hx.

## 2022-09-22 NOTE — Telephone Encounter (Signed)
Pt called back and advised he was on his way to get his labs done and I looked at his labs and he only had 2 that was different on our end. Pt will call back if he needs anything else for the lab

## 2022-09-22 NOTE — Telephone Encounter (Signed)
Just spoke with the pt advised to go to ED. Pt declined @ this time. He stated he will go later today sometime. He stated he was tired

## 2022-09-22 NOTE — Progress Notes (Signed)
Patient tolerated Vitamin B 12 injection with no complaints voiced.  Site clean and dry with no bruising or swelling noted.  No complaints of pain.  Discharged with vital signs stable and no signs or symptoms of distress noted.   ?

## 2022-09-22 NOTE — Telephone Encounter (Signed)
Returned the pt's call from message left on vm. No answer. The phone just continuously rang

## 2022-09-22 NOTE — Patient Instructions (Signed)
[facility]   Discharge Instructions: Thank you for choosing [facility] to provide your oncology and hematology care.  If you have a lab appointment with the Cancer Center - please note that after April 8th, 2024, all labs will be drawn in the cancer center.  You do not have to check in or register with the main entrance as you have in the past but will complete your check-in in the cancer center.  Wear comfortable clothing and clothing appropriate for easy access to any Portacath or PICC line.   We strive to give you quality time with your provider. You may need to reschedule your appointment if you arrive late (15 or more minutes).  Arriving late affects you and other patients whose appointments are after yours.  Also, if you miss three or more appointments without notifying the office, you may be dismissed from the clinic at the provider's discretion.      For prescription refill requests, have your pharmacy contact our office and allow 72 hours for refills to be completed.    Vitamin B12 Injection What is this medication? Vitamin B12 (VAHY tuh min B12) prevents and treats low vitamin B12 levels in your body. It is used in people who do not get enough vitamin B12 from their diet or when their digestive tract does not absorb enough. Vitamin B12 plays an important role in maintaining the health of your nervous system and red blood cells. This medicine may be used for other purposes; ask your health care provider or pharmacist if you have questions. COMMON BRAND NAME(S): B-12 Compliance Kit, B-12 Injection Kit, Cyomin, Dodex, LA-12, Nutri-Twelve, Physicians EZ Use B-12, Primabalt What should I tell my care team before I take this medication? They need to know if you have any of these conditions: Kidney disease Leber's disease Megaloblastic anemia An unusual or allergic reaction to cyanocobalamin, cobalt, other medications, foods, dyes, or  preservatives Pregnant or trying to get pregnant Breast-feeding How should I use this medication? This medication is injected into a muscle or deeply under the skin. It is usually given in a clinic or care team's office. However, your care team may teach you how to inject yourself. Follow all instructions. Talk to your care team about the use of this medication in children. Special care may be needed. Overdosage: If you think you have taken too much of this medicine contact a poison control center or emergency room at once. NOTE: This medicine is only for you. Do not share this medicine with others. What if I miss a dose? If you are given your dose at a clinic or care team's office, call to reschedule your appointment. If you give your own injections, and you miss a dose, take it as soon as you can. If it is almost time for your next dose, take only that dose. Do not take double or extra doses. What may interact with this medication? Alcohol Colchicine This list may not describe all possible interactions. Give your health care provider a list of all the medicines, herbs, non-prescription drugs, or dietary supplements you use. Also tell them if you smoke, drink alcohol, or use illegal drugs. Some items may interact with your medicine. What should I watch for while using this medication? Visit your care team regularly. You may need blood work done while you are taking this medication. You may need to follow a special diet. Talk to your care team. Limit your alcohol intake and avoid smoking to   get the best benefit. What side effects may I notice from receiving this medication? Side effects that you should report to your care team as soon as possible: Allergic reactions--skin rash, itching, hives, swelling of the face, lips, tongue, or throat Swelling of the ankles, hands, or feet Trouble breathing Side effects that usually do not require medical attention (report to your care team if they continue  or are bothersome): Diarrhea This list may not describe all possible side effects. Call your doctor for medical advice about side effects. You may report side effects to FDA at 1-800-FDA-1088. Where should I keep my medication? Keep out of the reach of children. Store at room temperature between 15 and 30 degrees C (59 and 85 degrees F). Protect from light. Throw away any unused medication after the expiration date. NOTE: This sheet is a summary. It may not cover all possible information. If you have questions about this medicine, talk to your doctor, pharmacist, or health care provider.  2024 Elsevier/Gold Standard (2020-12-28 00:00:00)    To help prevent nausea and vomiting after your treatment, we encourage you to take your nausea medication as directed.  BELOW ARE SYMPTOMS THAT SHOULD BE REPORTED IMMEDIATELY: *FEVER GREATER THAN 100.4 F (38 C) OR HIGHER *CHILLS OR SWEATING *NAUSEA AND VOMITING THAT IS NOT CONTROLLED WITH YOUR NAUSEA MEDICATION *UNUSUAL SHORTNESS OF BREATH *UNUSUAL BRUISING OR BLEEDING *URINARY PROBLEMS (pain or burning when urinating, or frequent urination) *BOWEL PROBLEMS (unusual diarrhea, constipation, pain near the anus) TENDERNESS IN MOUTH AND THROAT WITH OR WITHOUT PRESENCE OF ULCERS (sore throat, sores in mouth, or a toothache) UNUSUAL RASH, SWELLING OR PAIN  UNUSUAL VAGINAL DISCHARGE OR ITCHING   Items with * indicate a potential emergency and should be followed up as soon as possible or go to the Emergency Department if any problems should occur.  Please show the CHEMOTHERAPY ALERT CARD or IMMUNOTHERAPY ALERT CARD at check-in to the Emergency Department and triage nurse.  Should you have questions after your visit or need to cancel or reschedule your appointment, please contact [facility] [place] [phone]  and follow the prompts.  Office hours are 8:00 a.m. to 4:30 p.m. Monday - Friday. Please note that voicemails left after 4:00 p.m. may  not be returned until the following business day.  We are closed weekends and major holidays. You have access to a nurse at all times for urgent questions. Please call the main number to the clinic [phone] and follow the prompts.  For any non-urgent questions, you may also contact your provider using MyChart. We now offer e-Visits for anyone 18 and older to request care online for non-urgent symptoms. For details visit [email or web address].   Also download the MyChart app! Go to the app store, search "MyChart", open the app, select [facility], and log in with your MyChart username and password.   

## 2022-09-23 ENCOUNTER — Emergency Department (HOSPITAL_COMMUNITY): Payer: Medicaid Other

## 2022-09-23 DIAGNOSIS — K7460 Unspecified cirrhosis of liver: Secondary | ICD-10-CM | POA: Diagnosis present

## 2022-09-23 DIAGNOSIS — I700 Atherosclerosis of aorta: Secondary | ICD-10-CM | POA: Diagnosis present

## 2022-09-23 DIAGNOSIS — K219 Gastro-esophageal reflux disease without esophagitis: Secondary | ICD-10-CM | POA: Diagnosis not present

## 2022-09-23 DIAGNOSIS — K709 Alcoholic liver disease, unspecified: Secondary | ICD-10-CM | POA: Diagnosis present

## 2022-09-23 DIAGNOSIS — Z716 Tobacco abuse counseling: Secondary | ICD-10-CM | POA: Diagnosis not present

## 2022-09-23 DIAGNOSIS — N179 Acute kidney failure, unspecified: Principal | ICD-10-CM

## 2022-09-23 DIAGNOSIS — F17200 Nicotine dependence, unspecified, uncomplicated: Secondary | ICD-10-CM | POA: Diagnosis not present

## 2022-09-23 DIAGNOSIS — N136 Pyonephrosis: Secondary | ICD-10-CM | POA: Diagnosis present

## 2022-09-23 DIAGNOSIS — E872 Acidosis, unspecified: Secondary | ICD-10-CM | POA: Diagnosis not present

## 2022-09-23 DIAGNOSIS — N138 Other obstructive and reflux uropathy: Secondary | ICD-10-CM | POA: Diagnosis present

## 2022-09-23 DIAGNOSIS — Z79899 Other long term (current) drug therapy: Secondary | ICD-10-CM | POA: Diagnosis not present

## 2022-09-23 DIAGNOSIS — K8020 Calculus of gallbladder without cholecystitis without obstruction: Secondary | ICD-10-CM | POA: Diagnosis present

## 2022-09-23 DIAGNOSIS — F17210 Nicotine dependence, cigarettes, uncomplicated: Secondary | ICD-10-CM | POA: Diagnosis present

## 2022-09-23 DIAGNOSIS — Z7951 Long term (current) use of inhaled steroids: Secondary | ICD-10-CM | POA: Diagnosis not present

## 2022-09-23 DIAGNOSIS — N320 Bladder-neck obstruction: Secondary | ICD-10-CM | POA: Diagnosis present

## 2022-09-23 DIAGNOSIS — E7800 Pure hypercholesterolemia, unspecified: Secondary | ICD-10-CM | POA: Diagnosis present

## 2022-09-23 DIAGNOSIS — N261 Atrophy of kidney (terminal): Secondary | ICD-10-CM | POA: Diagnosis present

## 2022-09-23 DIAGNOSIS — J449 Chronic obstructive pulmonary disease, unspecified: Secondary | ICD-10-CM

## 2022-09-23 DIAGNOSIS — N1832 Chronic kidney disease, stage 3b: Secondary | ICD-10-CM | POA: Diagnosis present

## 2022-09-23 DIAGNOSIS — I129 Hypertensive chronic kidney disease with stage 1 through stage 4 chronic kidney disease, or unspecified chronic kidney disease: Secondary | ICD-10-CM | POA: Diagnosis present

## 2022-09-23 DIAGNOSIS — K5730 Diverticulosis of large intestine without perforation or abscess without bleeding: Secondary | ICD-10-CM | POA: Diagnosis present

## 2022-09-23 DIAGNOSIS — Z7982 Long term (current) use of aspirin: Secondary | ICD-10-CM | POA: Diagnosis not present

## 2022-09-23 DIAGNOSIS — J4489 Other specified chronic obstructive pulmonary disease: Secondary | ICD-10-CM | POA: Diagnosis present

## 2022-09-23 DIAGNOSIS — Z8719 Personal history of other diseases of the digestive system: Secondary | ICD-10-CM | POA: Diagnosis not present

## 2022-09-23 DIAGNOSIS — Z87442 Personal history of urinary calculi: Secondary | ICD-10-CM | POA: Diagnosis not present

## 2022-09-23 DIAGNOSIS — N401 Enlarged prostate with lower urinary tract symptoms: Secondary | ICD-10-CM | POA: Diagnosis present

## 2022-09-23 LAB — CBC WITH DIFFERENTIAL/PLATELET
Abs Immature Granulocytes: 0.02 10*3/uL (ref 0.00–0.07)
Abs Immature Granulocytes: 0.02 K/uL (ref 0.00–0.07)
Basophils Absolute: 0.1 10*3/uL (ref 0.0–0.1)
Basophils Absolute: 0.1 K/uL (ref 0.0–0.1)
Basophils Relative: 1 %
Basophils Relative: 1 %
Eosinophils Absolute: 0.3 10*3/uL (ref 0.0–0.5)
Eosinophils Absolute: 0.3 K/uL (ref 0.0–0.5)
Eosinophils Relative: 3 %
Eosinophils Relative: 4 %
HCT: 36.9 % — ABNORMAL LOW (ref 39.0–52.0)
HCT: 38.9 % — ABNORMAL LOW (ref 39.0–52.0)
Hemoglobin: 12 g/dL — ABNORMAL LOW (ref 13.0–17.0)
Hemoglobin: 12.4 g/dL — ABNORMAL LOW (ref 13.0–17.0)
Immature Granulocytes: 0 %
Immature Granulocytes: 0 %
Lymphocytes Relative: 29 %
Lymphocytes Relative: 32 %
Lymphs Abs: 2.5 K/uL (ref 0.7–4.0)
Lymphs Abs: 2.6 10*3/uL (ref 0.7–4.0)
MCH: 28.7 pg (ref 26.0–34.0)
MCH: 29.1 pg (ref 26.0–34.0)
MCHC: 31.9 g/dL (ref 30.0–36.0)
MCHC: 32.5 g/dL (ref 30.0–36.0)
MCV: 89.6 fL (ref 80.0–100.0)
MCV: 90 fL (ref 80.0–100.0)
Monocytes Absolute: 1 K/uL (ref 0.1–1.0)
Monocytes Absolute: 1.1 10*3/uL — ABNORMAL HIGH (ref 0.1–1.0)
Monocytes Relative: 12 %
Monocytes Relative: 14 %
Neutro Abs: 3.9 10*3/uL (ref 1.7–7.7)
Neutro Abs: 4.7 K/uL (ref 1.7–7.7)
Neutrophils Relative %: 49 %
Neutrophils Relative %: 55 %
Platelets: 323 10*3/uL (ref 150–400)
Platelets: 334 K/uL (ref 150–400)
RBC: 4.12 MIL/uL — ABNORMAL LOW (ref 4.22–5.81)
RBC: 4.32 MIL/uL (ref 4.22–5.81)
RDW: 13.7 % (ref 11.5–15.5)
RDW: 14 % (ref 11.5–15.5)
WBC: 7.9 10*3/uL (ref 4.0–10.5)
WBC: 8.5 K/uL (ref 4.0–10.5)
nRBC: 0 % (ref 0.0–0.2)
nRBC: 0 % (ref 0.0–0.2)

## 2022-09-23 LAB — COMPREHENSIVE METABOLIC PANEL WITH GFR
ALT: 14 U/L (ref 0–44)
AST: 25 U/L (ref 15–41)
Albumin: 3.1 g/dL — ABNORMAL LOW (ref 3.5–5.0)
Alkaline Phosphatase: 100 U/L (ref 38–126)
Anion gap: 14 (ref 5–15)
BUN: 41 mg/dL — ABNORMAL HIGH (ref 6–20)
CO2: 17 mmol/L — ABNORMAL LOW (ref 22–32)
Calcium: 8.2 mg/dL — ABNORMAL LOW (ref 8.9–10.3)
Chloride: 101 mmol/L (ref 98–111)
Creatinine, Ser: 3.43 mg/dL — ABNORMAL HIGH (ref 0.61–1.24)
GFR, Estimated: 20 mL/min — ABNORMAL LOW
Glucose, Bld: 106 mg/dL — ABNORMAL HIGH (ref 70–99)
Potassium: 3.6 mmol/L (ref 3.5–5.1)
Sodium: 132 mmol/L — ABNORMAL LOW (ref 135–145)
Total Bilirubin: 0.6 mg/dL (ref 0.3–1.2)
Total Protein: 8.3 g/dL — ABNORMAL HIGH (ref 6.5–8.1)

## 2022-09-23 LAB — COMPREHENSIVE METABOLIC PANEL
ALT: 16 U/L (ref 0–44)
AST: 27 U/L (ref 15–41)
Albumin: 2.9 g/dL — ABNORMAL LOW (ref 3.5–5.0)
Alkaline Phosphatase: 105 U/L (ref 38–126)
Anion gap: 11 (ref 5–15)
BUN: 41 mg/dL — ABNORMAL HIGH (ref 6–20)
CO2: 21 mmol/L — ABNORMAL LOW (ref 22–32)
Calcium: 8.3 mg/dL — ABNORMAL LOW (ref 8.9–10.3)
Chloride: 101 mmol/L (ref 98–111)
Creatinine, Ser: 3.42 mg/dL — ABNORMAL HIGH (ref 0.61–1.24)
GFR, Estimated: 20 mL/min — ABNORMAL LOW (ref >60)
Glucose, Bld: 100 mg/dL — ABNORMAL HIGH (ref 70–99)
Potassium: 3.8 mmol/L (ref 3.5–5.1)
Sodium: 133 mmol/L — ABNORMAL LOW (ref 135–145)
Total Bilirubin: 0.2 mg/dL — ABNORMAL LOW (ref 0.3–1.2)
Total Protein: 8.1 g/dL (ref 6.5–8.1)

## 2022-09-23 LAB — URINALYSIS, W/ REFLEX TO CULTURE (INFECTION SUSPECTED)
Bacteria, UA: NONE SEEN
Bilirubin Urine: NEGATIVE
Glucose, UA: NEGATIVE mg/dL
Hgb urine dipstick: NEGATIVE
Ketones, ur: NEGATIVE mg/dL
Leukocytes,Ua: NEGATIVE
Nitrite: NEGATIVE
Protein, ur: 100 mg/dL — AB
Specific Gravity, Urine: 1.008 (ref 1.005–1.030)
pH: 5 (ref 5.0–8.0)

## 2022-09-23 LAB — AFP TUMOR MARKER: AFP, Serum, Tumor Marker: 2 ng/mL (ref 0.0–8.4)

## 2022-09-23 LAB — MAGNESIUM: Magnesium: 1.7 mg/dL (ref 1.7–2.4)

## 2022-09-23 LAB — HOMOCYSTEINE: Homocysteine: 18.9 umol/L — ABNORMAL HIGH (ref 0.0–14.5)

## 2022-09-23 MED ORDER — TAMSULOSIN HCL 0.4 MG PO CAPS
0.4000 mg | ORAL_CAPSULE | Freq: Every day | ORAL | Status: DC
  Administered 2022-09-23 – 2022-09-24 (×2): 0.4 mg via ORAL
  Filled 2022-09-23 (×2): qty 1

## 2022-09-23 MED ORDER — ALBUTEROL SULFATE (2.5 MG/3ML) 0.083% IN NEBU: 3.0000 mL | INHALATION_SOLUTION | RESPIRATORY_TRACT | Status: DC | PRN

## 2022-09-23 MED ORDER — ACETAMINOPHEN 325 MG PO TABS
650.0000 mg | ORAL_TABLET | Freq: Four times a day (QID) | ORAL | Status: DC | PRN
  Administered 2022-09-23 – 2022-09-25 (×4): 650 mg via ORAL
  Filled 2022-09-23 (×4): qty 2

## 2022-09-23 MED ORDER — ONDANSETRON HCL 4 MG PO TABS: 4.0000 mg | ORAL_TABLET | Freq: Four times a day (QID) | ORAL | Status: DC | PRN

## 2022-09-23 MED ORDER — SODIUM CHLORIDE 0.9 % IV SOLN: INTRAVENOUS | Status: AC

## 2022-09-23 MED ORDER — ACETAMINOPHEN 650 MG RE SUPP: 650.0000 mg | Freq: Four times a day (QID) | RECTAL | Status: DC | PRN

## 2022-09-23 MED ORDER — MOMETASONE FURO-FORMOTEROL FUM 200-5 MCG/ACT IN AERO
2.0000 | INHALATION_SPRAY | Freq: Two times a day (BID) | RESPIRATORY_TRACT | Status: DC
  Administered 2022-09-23 – 2022-09-25 (×5): 2 via RESPIRATORY_TRACT
  Filled 2022-09-23: qty 8.8

## 2022-09-23 MED ORDER — OXYCODONE HCL 5 MG PO TABS: 5.0000 mg | ORAL_TABLET | Freq: Four times a day (QID) | ORAL | Status: DC | PRN

## 2022-09-23 MED ORDER — OXYCODONE HCL 5 MG PO TABS: 5.0000 mg | ORAL_TABLET | ORAL | Status: DC | PRN

## 2022-09-23 MED ORDER — ASPIRIN 81 MG PO CHEW
81.0000 mg | CHEWABLE_TABLET | Freq: Every day | ORAL | Status: DC
  Administered 2022-09-23 – 2022-09-25 (×3): 81 mg via ORAL
  Filled 2022-09-23 (×3): qty 1

## 2022-09-23 MED ORDER — HEPARIN SODIUM (PORCINE) 5000 UNIT/ML IJ SOLN
5000.0000 [IU] | Freq: Three times a day (TID) | INTRAMUSCULAR | Status: DC
  Administered 2022-09-23 – 2022-09-25 (×7): 5000 [IU] via SUBCUTANEOUS
  Filled 2022-09-23 (×7): qty 1

## 2022-09-23 MED ORDER — METOPROLOL TARTRATE 25 MG PO TABS
25.0000 mg | ORAL_TABLET | Freq: Two times a day (BID) | ORAL | Status: DC
  Administered 2022-09-23 – 2022-09-25 (×5): 25 mg via ORAL
  Filled 2022-09-23 (×5): qty 1

## 2022-09-23 MED ORDER — MUSCLE RUB 10-15 % EX CREA: 1.0000 | TOPICAL_CREAM | CUTANEOUS | Status: DC | PRN

## 2022-09-23 MED ORDER — SODIUM BICARBONATE 650 MG PO TABS
650.0000 mg | ORAL_TABLET | Freq: Two times a day (BID) | ORAL | Status: DC
  Administered 2022-09-23 – 2022-09-24 (×4): 650 mg via ORAL
  Filled 2022-09-23 (×5): qty 1

## 2022-09-23 MED ORDER — NICOTINE 14 MG/24HR TD PT24
14.0000 mg | MEDICATED_PATCH | Freq: Every day | TRANSDERMAL | Status: DC
  Administered 2022-09-23 – 2022-09-24 (×2): 14 mg via TRANSDERMAL
  Filled 2022-09-23 (×3): qty 1

## 2022-09-23 MED ORDER — ONDANSETRON HCL 4 MG/2ML IJ SOLN: 4.0000 mg | Freq: Four times a day (QID) | INTRAMUSCULAR | Status: DC | PRN

## 2022-09-23 NOTE — Assessment & Plan Note (Signed)
-   Counseled on cessation - Nicotine patch ordered

## 2022-09-23 NOTE — Assessment & Plan Note (Signed)
-  Creatinine increased from 1.2 in January of this year, 2.8, to 3.4 at time of admission. -Creatinine at discharge 2.22 -Based on his chart review patient with chronic kidney disease stage IIIb at baseline. -Continue fluid toxic agents,Avoiding hypotension and the use of contrast -Patient Lasix/spironolactone dosage has been adjusted. -Continue close monitoring of patient's renal function trend after discharge. -Will continue the use of Flomax to alleviate bladder obstruction most likely triggered by the presence of BPH and follow-up with urology as an outpatient. -Maintain adequate hydration. -Patient will continue treatment with sodium bicarbonate for component of metabolic acidosis. -No need for hemodialysis at the moment; patient will benefit of establishing care with nephrology after discharge.

## 2022-09-23 NOTE — Progress Notes (Signed)
Patient seen and examined; admitted after midnight secondary to abnormal blood work from GI office; patient found with worsening creatinine level reflecting acute on chronic renal failure.  Patient with underlying history of cirrhosis, chronic kidney disease stage IIIb, hypertension, hyperlipidemia, tobacco abuse, asthma and history of nephrolithiasis.  Patient reports no fever, no flank pains, no nausea or vomiting and expressed good medication compliance.  Refer to H&P written by Dr. Carren Rang for further info/details on admission.  Plan: -Will check bladder scan -Continue fluid resuscitation -Follow renal function trend -Continue serum bicarbonate -Starting Flomax. -Clinical response on renal service recommendations. -Minimize the use of nephrotoxic agents, avoid contrast and prevent hypotension.  Vassie Loll MD [phone]

## 2022-09-23 NOTE — TOC Initial Note (Signed)
Transition of Care [facility]) - Initial/Assessment Note    Patient Details  Name: [name] [name] MRN: [id number] Date of Birth: 29-Aug-1965  Transition of Care (TOC) CM/SW Contact:    Catalina Gravel, LCSW Phone Number: 09/23/2022, 5:35 PM  Clinical Narrative:                 Pt advised by GI Dr to report to ER due to test results AKI.  Continued medical evaluation at this time. TOC to follow.     Barriers to Discharge: Continued Medical Work up   Patient Goals and CMS Choice            Expected Discharge Plan and Services                                              Prior Living Arrangements/Services                       Activities of Daily Living      Permission Sought/Granted                  Emotional Assessment              Admission diagnosis:  AKI (acute kidney injury) (HCC) [N17.9] Patient Active Problem List   Diagnosis Date Noted   Acute pancreatitis without infection or necrosis 05/24/2022   Alcohol-induced acute pancreatitis 05/23/2022   Cholelithiasis 05/20/2022   Tobacco use disorder 05/20/2022   GERD (gastroesophageal reflux disease) 05/20/2022   B12 deficiency 03/15/2022   History of colonic polyps 03/09/2022   Upper abdominal pain 08/11/2021   AKI (acute kidney injury) (HCC) 08/11/2021   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   Elevated ferritin 08/26/2020   Hypomagnesemia 08/11/2020   Hypoalbuminemia 08/11/2020   Hypotension 08/11/2020   Anemia of chronic disease 08/11/2020   DVT (deep venous thrombosis) -Lt LL Extensive DVT 08/05/2020   Alcoholic cirrhosis of liver with ascites (HCC) 08/05/2020   COPD (chronic obstructive pulmonary disease) (HCC) 08/05/2020   Hypokalemia 08/05/2020   Cirrhosis of liver without ascites (HCC) 08/04/2020   Edema of left lower extremity 08/04/2020   Loss of weight 07/07/2020   Loss of appetite 07/07/2020   Anasarca 07/07/2020   Heme + stool 01/22/2017    Abnormal LFTs 01/22/2017   Hyperlipidemia 02/08/2015   Cigarette nicotine dependence with nicotine-induced disorder 02/08/2015   Leukocytosis, unspecified 01/07/2013   Hyponatremia 01/07/2013   UTI (urinary tract infection) 01/06/2013   Kidney stone 01/06/2013   Ureteral colic 01/06/2013   PCP:  Benetta Spar, MD Pharmacy:   [facility] [phone] - [place], [place] - [address] AT SEC [address] [place] [address] Phone: [phone] Fax: [phone]     Social Determinants of Health (SDOH) Social History: SDOH Screenings   Food Insecurity: No Food Insecurity (09/23/2022)  Housing: Low Risk  (09/23/2022)  Transportation Needs: No Transportation Needs (09/23/2022)  Utilities: Not At Risk (09/23/2022)  Alcohol Screen: Low Risk  (09/13/2020)  Financial Resource Strain: High Risk (09/13/2020)  Physical Activity: Sufficiently Active (09/13/2020)  Social Connections: Moderately Isolated (09/13/2020)  Stress: No Stress Concern Present (09/13/2020)  Tobacco Use: High Risk (09/22/2022)   SDOH Interventions:     Readmission Risk Interventions    05/22/2022  9:08 AM  Readmission Risk Prevention Plan  Transportation Screening Complete  HRI or Home Care Consult Complete  Social Work Consult for Recovery Care Planning/Counseling Complete  Palliative Care Screening Not Applicable  Medication Review Oceanographer) Complete

## 2022-09-23 NOTE — ED Provider Notes (Signed)
[place] EMERGENCY DEPARTMENT AT [facility]  Provider Note  CSN: [id number] Arrival date & time: 09/22/22 2236  History Chief Complaint  Patient presents with   abnormal labs    [name] [name] is a 57 y.o. male with history of EtOH abuse (abstaining for about 5 months), alcoholic liver disease was admitted in Jan 2024 for AKI, cholelithiasis and pancreatitis. Noted to have some hydronephrosis on CT then. Also had a UTI. AKI improved during admission and he was ultimately discharged. He was in the urology office for follow up about 10 days ago and had labs done then showing worsneing renal function again. Those labs were repeated the morning prior to arrival and noted to have worsened even further. He reports he is overall doing well. Has occasional R lower back pain, but no fever, N/V/D, dysuria, hematuria, urinary hesitancy or incomplete voiding. He was advised to come to the ED for evaluation after labs resulted today.    Home Medications Prior to Admission medications   Medication Sig Start Date End Date Taking? Authorizing Provider  acetaminophen (TYLENOL) 500 MG tablet Take 2,000 mg by mouth every 6 (six) hours as needed for moderate pain.    [provider]  albuterol (PROVENTIL HFA) 108 (90 Base) MCG/ACT inhaler INHALE 2 PUFFS BY MOUTH EVERY 6 HOURS AS NEEDED FOR COUGHING, WHEEZING, OR SHORTNESS OF BREATH 02/10/21   Shon Hale, MD  aspirin 81 MG chewable tablet Chew 81 mg by mouth daily.    [provider]  Cyanocobalamin (B-12 COMPLIANCE INJECTION IJ) Inject 1 Dose as directed every 30 (thirty) days.    [provider]  furosemide (LASIX) 20 MG tablet TAKE 2 TABLETS BY MOUTH IN THE MORNING AND 1 TABLET IN THE AFTERNOON 08/14/22   Gelene Mink, NP  Menthol-Methyl Salicylate (MUSCLE RUB) 10-15 % CREA Apply 1 Application topically as needed for muscle pain.    [provider]  metoprolol tartrate (LOPRESSOR) 25 MG tablet Take 25 mg by  mouth 2 (two) times daily. 05/24/21   [provider]  mometasone-formoterol (DULERA) 200-5 MCG/ACT AERO Inhale 2 puffs into the lungs 2 (two) times daily. 02/10/21   Shon Hale, MD  ondansetron (ZOFRAN-ODT) 4 MG disintegrating tablet Take 1 tablet (4 mg total) by mouth every 8 (eight) hours as needed. 09/12/22   Gelene Mink, NP  pantoprazole (PROTONIX) 40 MG tablet TAKE 1 TABLET(40 MG) BY MOUTH DAILY 09/18/22   Gelene Mink, NP  sodium bicarbonate 650 MG tablet Take 650 mg by mouth 2 (two) times daily.    [provider]  spironolactone (ALDACTONE) 50 MG tablet Take 1-2 tablets (50-100 mg total) by mouth See admin instructions. Take 100 mg by mouth in morning and 50 mg in the afternoon 08/16/22   Carlan, Jeral Pinch, NP     Allergies    Patient has no known allergies.   Review of Systems   Review of Systems Please see HPI for pertinent positives and negatives  Physical Exam BP 118/66 (BP Location: Left Arm)   Pulse 73   Temp 98.6 F (37 C) (Oral)   Resp 16   Ht 6' (1.829 m)   Wt 81.6 kg   SpO2 97%   BMI 24.41 kg/m   Physical Exam Vitals and nursing note reviewed.  Constitutional:      Appearance: Normal appearance.  HENT:     Head: Normocephalic and atraumatic.     Nose: Nose normal.     Mouth/Throat:  Mouth: Mucous membranes are moist.  Eyes:     Extraocular Movements: Extraocular movements intact.     Conjunctiva/sclera: Conjunctivae normal.  Cardiovascular:     Rate and Rhythm: Normal rate.  Pulmonary:     Effort: Pulmonary effort is normal.     Breath sounds: Normal breath sounds.  Abdominal:     General: Abdomen is flat.     Palpations: Abdomen is soft.     Tenderness: There is no abdominal tenderness.  Musculoskeletal:        General: No swelling. Normal range of motion.     Cervical back: Neck supple.  Skin:    General: Skin is warm and dry.  Neurological:     General: No focal deficit present.     Mental Status: He is alert.   Psychiatric:        Mood and Affect: Mood normal.     ED Results / Procedures / Treatments   EKG None  Procedures Procedures  Medications Ordered in the ED Medications - No data to display  Initial Impression and Plan  Patient here with outpatient labs indicating worsening renal function. He is relatively asymptomatic. Labs done here show CBC with mild anemia, similar to previous. CMP with elevated Cr worsened from last week and more than doubled since earlier in the year. UA with some protein but no signs of infection. Give prior issues with hydronephrosis, will send for CT. His post-void residual is , so doubt this is a post-obstructive problem.   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 Will plan admission for further evaluation of AKI [CS]  0320 Spoke with Dr. Carren Rang, Hospitalist, who will evaluate for admission.  [CS]    Clinical Course User Index [CS] Pollyann Savoy, MD     MDM Rules/Calculators/A&P Medical Decision Making Problems Addressed: AKI (acute kidney injury) [facility]): acute illness or injury  Amount and/or Complexity of Data Reviewed Labs: ordered. Decision-making details documented in ED Course. Radiology: ordered and independent interpretation performed. Decision-making details documented in ED Course.  Risk Decision regarding hospitalization.     Final Clinical Impression(s) / ED Diagnoses Final diagnoses:  AKI (acute kidney injury) [facility] [place])    Rx / DC Orders ED Discharge Orders     None        Pollyann Savoy, MD 09/23/22 [phone]

## 2022-09-23 NOTE — Assessment & Plan Note (Addendum)
-   Continue current bronchodilator management -No acute exacerbation appreciated. -Good saturation on room air appreciated.

## 2022-09-23 NOTE — ED Notes (Signed)
Patient transported to CT 

## 2022-09-23 NOTE — H&P (Signed)
History and Physical    Patient: [name] [name]:[id number] DOB: 1965-09-14 DOA: 09/22/2022 DOS: the patient was seen and examined on 09/23/2022 PCP: Benetta Spar, MD  Patient coming from: Home  Chief Complaint:  Chief Complaint  Patient presents with   abnormal labs   HPI: [name] [name] is a 57 y.o. male with medical history significant of cirrhosis, hyperlipidemia, hypertension, nephrolithiasis, history of alcohol use but last drink was 5 months ago, tobacco use disorder, and more presents the ED with a chief complaint of abnormal creatinine.  Patient reports that he was at the GI doctor for routine follow-up regarding his cirrhosis.  Lab work was done and the GI doc called him and told him he had to come into the ER for increased creatinine.  Patient reports no urinary symptoms including no urgency, frequency, obstructive symptoms,, hematuria, decrease or increase in urine output, or otherwise.  Patient reports he does have some solids in his urine but that is been going on for months.  He denies any fever, flank pain.  Patient reports the only thing that is been abnormal for him in the recent history is headaches.  This started last week.  They are in the front of his head.  He has not been taking NSAIDs for them.  He has taken Tylenol 500 mg a couple of times but ran out of that medication.  He does not have a headache at the time of our conversation.  Patient has no other complaints at this time.  Patient is a current smoker and request nicotine patch.  His last drink was 5 months ago.  He does not use illicit drugs.  He is vaccinated for COVID.  Patient would like to remain full code.  Review of Systems: As mentioned in the history of present illness. All other systems reviewed and are negative. Past Medical History:  Diagnosis Date   Asthma    B12 deficiency 03/15/2022   Cirrhosis (HCC)    Dyspnea    High cholesterol    History of kidney stones    Hypertension    Kidney  stones    Past Surgical History:  Procedure Laterality Date   APPENDECTOMY     BIOPSY  07/22/2020   Procedure: BIOPSY;  Surgeon: Corbin Ade, MD;  Location: [facility];  Service: Endoscopy;;   COLONOSCOPY WITH PROPOFOL N/A 04/18/2017   non-bleeding internal hemorrhoids, two 4-6 mm polyps in descending colon and cecum, pancolonic diverticulosis, single cecal AVM. Tubular adenomas, surveillance in 2023.    COLONOSCOPY WITH PROPOFOL N/A 04/13/2022   pancolonic diverticulosis, multiple polyps, one which was 11 mm (tubular adenomas). Surveillance in 3 years.   ESOPHAGOGASTRODUODENOSCOPY (EGD) WITH PROPOFOL N/A 07/22/2020   normal esophagus, small hiatal hernia, abnormal gastric mucosa, abnormal appearing ampula and periampullary mucosa. Mild chronic gastritis.   ESOPHAGOGASTRODUODENOSCOPY (EGD) WITH PROPOFOL N/A 09/08/2021   normal esophagus, retained food in stomach and duodenum precluded complete examination.   FRACTURE SURGERY     left arm   IR PARACENTESIS  05/31/2021   LOWER EXTREMITY VENOGRAPHY N/A 08/10/2020   Procedure: LOWER EXTREMITY VENOGRAPHY;  Surgeon: Maeola Harman, MD;  Location: [facility] INVASIVE CV LAB;  Service: Cardiovascular;  Laterality: N/A;   PERIPHERAL VASCULAR BALLOON ANGIOPLASTY Left 08/10/2020   Procedure: PERIPHERAL VASCULAR BALLOON ANGIOPLASTY;  Surgeon: Maeola Harman, MD;  Location: [facility] INVASIVE CV LAB;  Service: Cardiovascular;  Laterality: Left;  lower extremity venous   PERIPHERAL VASCULAR THROMBECTOMY N/A 08/10/2020   Procedure:  PERIPHERAL VASCULAR THROMBECTOMY;  Surgeon: Maeola Harman, MD;  Location[facility] INVASIVE CV LAB;  Service: Cardiovascular;  Laterality: N/A;   POLYPECTOMY  04/18/2017   Procedure: POLYPECTOMY;  Surgeon: Corbin Ade, MD;  Location: [facility];  Service: Endoscopy;;   POLYPECTOMY  04/13/2022   Procedure: POLYPECTOMY;  Surgeon: Corbin Ade, MD;  Location: [facility];  Service: Endoscopy;;    Social History:  reports that he has been smoking cigarettes. He has a 16.50 pack-year smoking history. He has been exposed to tobacco smoke. He has never used smokeless tobacco. He reports that he does not currently use alcohol. He reports that he does not use drugs.  No Known Allergies  Family History  Problem Relation Age of Onset   Cancer Father        throat   Diabetes Sister    Colon cancer Neg Hx     Prior to Admission medications   Medication Sig Start Date End Date Taking? Authorizing Provider  acetaminophen (TYLENOL) 500 MG tablet Take 2,000 mg by mouth every 6 (six) hours as needed for moderate pain.    [provider]  albuterol (PROVENTIL HFA) 108 (90 Base) MCG/ACT inhaler INHALE 2 PUFFS BY MOUTH EVERY 6 HOURS AS NEEDED FOR COUGHING, WHEEZING, OR SHORTNESS OF BREATH 02/10/21   Shon Hale, MD  aspirin 81 MG chewable tablet Chew 81 mg by mouth daily.    [provider]  Cyanocobalamin (B-12 COMPLIANCE INJECTION IJ) Inject 1 Dose as directed every 30 (thirty) days.    [provider]  furosemide (LASIX) 20 MG tablet TAKE 2 TABLETS BY MOUTH IN THE MORNING AND 1 TABLET IN THE AFTERNOON 08/14/22   Gelene Mink, NP  Menthol-Methyl Salicylate (MUSCLE RUB) 10-15 % CREA Apply 1 Application topically as needed for muscle pain.    [provider]  metoprolol tartrate (LOPRESSOR) 25 MG tablet Take 25 mg by mouth 2 (two) times daily. 05/24/21   [provider]  mometasone-formoterol (DULERA) 200-5 MCG/ACT AERO Inhale 2 puffs into the lungs 2 (two) times daily. 02/10/21   Shon Hale, MD  ondansetron (ZOFRAN-ODT) 4 MG disintegrating tablet Take 1 tablet (4 mg total) by mouth every 8 (eight) hours as needed. 09/12/22   Gelene Mink, NP  pantoprazole (PROTONIX) 40 MG tablet TAKE 1 TABLET(40 MG) BY MOUTH DAILY 09/18/22   Gelene Mink, NP  sodium bicarbonate 650 MG tablet Take 650 mg by mouth 2 (two) times daily.    [provider]   spironolactone (ALDACTONE) 50 MG tablet Take 1-2 tablets (50-100 mg total) by mouth See admin instructions. Take 100 mg by mouth in morning and 50 mg in the afternoon 08/16/22   Raquel James, NP    Physical Exam: Vitals:   09/23/22 0340 09/23/22 0341 09/23/22 0415 09/23/22 0420  BP: 118/66   118/68  Pulse: 73   70  Resp: 16     Temp:  98.6 F (37 C)  98.4 F (36.9 C)  TempSrc:  Oral  Oral  SpO2: 97%   100%  Weight:   79.3 kg   Height:   6' (1.829 m)    1.  General: Patient lying supine in bed,  no acute distress   2. Psychiatric: Alert and oriented x 3, mood and behavior normal for situation, pleasant and cooperative with exam   3. Neurologic: Speech and language are normal, face is symmetric, moves all 4 extremities voluntarily, at baseline without acute deficits on  limited exam   4. HEENMT:  Head is atraumatic, normocephalic, pupils reactive to light, neck is supple, trachea is midline, mucous membranes are moist   5. Respiratory : Lungs are clear to auscultation bilaterally without wheezing, rhonchi, rales, no cyanosis, no increase in work of breathing or accessory muscle use   6. Cardiovascular : Heart rate normal, rhythm is regular, no rubs or gallops, no peripheral edema, peripheral pulses palpated   7. Gastrointestinal:  Abdomen is soft, minimally distended, nontender to palpation bowel sounds active, no masses or organomegaly palpated   8. Skin:  Skin is warm, dry and intact without rashes, acute lesions, or ulcers on limited exam   9.Musculoskeletal:  No acute deformities or trauma, no asymmetry in tone, no peripheral edema, peripheral pulses palpated, no tenderness to palpation in the extremities  Data Reviewed: In the ED Temp 97.9, heart rate 66/81, respiratory rate 16-18, blood pressure 104/64-129/71, satting 97-100% CT renal study shows atrophic left kidney, hepatic cirrhosis, possible acute cystitis to correlate with UA UA does not indicate UTI No  leukocytosis with a white blood cell count of 8.5, hemoglobin 12.0, platelets 334 Chemistry reveals an elevated creatinine at 3.43 Admission requested for AKI  Assessment and Plan: * AKI (acute kidney injury) (HCC) - Creatinine increased from 1.2 in January of this year, 2.8, to 3.4 now - Hold nephrotoxic agents including PPI, Lasix - Continue sodium bicarb - Bicarb on chemistry is 17 - CT renal stone study was done that showed hepatic cirrhosis, cholelithiasis, atrophic left kidney, colonic diverticulosis, aortic atherosclerosis, and bladder wall thickening to correlate with UA - UA does not indicate UTI - Previous CT renal stone study showed nonobstructing intrarenal stones on the left and right hydronephrosis with left renal atrophy at that time as well, this was in January - Nephro consult - Continue gentle IV hydration - Continue to monitor  COPD (chronic obstructive pulmonary disease) (HCC) - Continue albuterol and Dulera  GERD (gastroesophageal reflux disease) - Holding PPI in the setting of AKI  Tobacco use disorder - Counseled on cessation - Nicotine patch ordered      Advance Care Planning:   Code Status: Full Code  Consults: Nephrology-please follow-up with contacting them  Family Communication: No family at bedside  Severity of Illness: The appropriate patient status for this patient is OBSERVATION. Observation status is judged to be reasonable and necessary in order to provide the required intensity of service to ensure the patient's safety. The patient's presenting symptoms, physical exam findings, and initial radiographic and laboratory data in the context of their medical condition is felt to place them at decreased risk for further clinical deterioration. Furthermore, it is anticipated that the patient will be medically stable for discharge from the hospital within 2 midnights of admission.   Author: Lilyan Gilford, DO 09/23/2022 6:08 AM  For on call  review [email or web address].

## 2022-09-23 NOTE — Assessment & Plan Note (Addendum)
-  Continue PPI. °-Lifestyle changes discussed with patient. °

## 2022-09-24 DIAGNOSIS — N179 Acute kidney failure, unspecified: Secondary | ICD-10-CM | POA: Diagnosis not present

## 2022-09-24 DIAGNOSIS — J449 Chronic obstructive pulmonary disease, unspecified: Secondary | ICD-10-CM | POA: Diagnosis not present

## 2022-09-24 DIAGNOSIS — K219 Gastro-esophageal reflux disease without esophagitis: Secondary | ICD-10-CM | POA: Diagnosis not present

## 2022-09-24 DIAGNOSIS — F17200 Nicotine dependence, unspecified, uncomplicated: Secondary | ICD-10-CM | POA: Diagnosis not present

## 2022-09-24 LAB — BASIC METABOLIC PANEL
Anion gap: 10 (ref 5–15)
BUN: 38 mg/dL — ABNORMAL HIGH (ref 6–20)
CO2: 18 mmol/L — ABNORMAL LOW (ref 22–32)
Calcium: 8.2 mg/dL — ABNORMAL LOW (ref 8.9–10.3)
Chloride: 106 mmol/L (ref 98–111)
Creatinine, Ser: 2.53 mg/dL — ABNORMAL HIGH (ref 0.61–1.24)
GFR, Estimated: 29 mL/min — ABNORMAL LOW (ref >60)
Glucose, Bld: 139 mg/dL — ABNORMAL HIGH (ref 70–99)
Potassium: 3.8 mmol/L (ref 3.5–5.1)
Sodium: 134 mmol/L — ABNORMAL LOW (ref 135–145)

## 2022-09-24 MED ORDER — PANTOPRAZOLE SODIUM 40 MG PO TBEC
40.0000 mg | DELAYED_RELEASE_TABLET | Freq: Every day | ORAL | Status: DC
  Administered 2022-09-24 – 2022-09-25 (×2): 40 mg via ORAL
  Filled 2022-09-24 (×2): qty 1

## 2022-09-24 MED ORDER — SODIUM CHLORIDE 0.9 % IV SOLN: INTRAVENOUS | Status: AC

## 2022-09-24 NOTE — Progress Notes (Signed)
Progress Note   Patient: [name] [name] ZOX:[id number] DOB: October 13, 1965 DOA: 09/22/2022     1 DOS: the patient was seen and examined on 09/24/2022   Brief hospital admission narrative: As per H&P written by Dr. Carren Rang on 09/23/22 [name] is a 57 y.o. male with medical history significant of cirrhosis, hyperlipidemia, hypertension, nephrolithiasis, history of alcohol use but last drink was 5 months ago, tobacco use disorder, and more presents the ED with a chief complaint of abnormal creatinine.  Patient reports that he was at the GI doctor for routine follow-up regarding his cirrhosis.  Lab work was done and the GI doc called him and told him he had to come into the ER for increased creatinine.  Patient reports no urinary symptoms including no urgency, frequency, obstructive symptoms,, hematuria, decrease or increase in urine output, or otherwise.  Patient reports he does have some solids in his urine but that is been going on for months.  He denies any fever, flank pain.  Patient reports the only thing that is been abnormal for him in the recent history is headaches.  This started last week.  They are in the front of his head.  He has not been taking NSAIDs for them.  He has taken Tylenol 500 mg a couple of times but ran out of that medication.  He does not have a headache at the time of our conversation.  Patient has no other complaints at this time.   Patient is a current smoker and request nicotine patch.  His last drink was 5 months ago.  He does not use illicit drugs.  He is vaccinated for COVID.  Patient would like to remain full code.  Assessment and Plan: * AKI (acute kidney injury) (HCC) - Creatinine increased from 1.2 in January of this year, 2.8, to 3.4 at time of admission. -Based on his chart review patient with chronic kidney disease stage IIIb at baseline. -Continue holding nephrotoxic agents, minimizing the use of contrast and avoiding hypotension -Will continue fluid  resuscitation oral sodium bicarbonate given presence of metabolic lactic acidosis. -Continue treatment with Flomax to alleviate bladder obstruction and follow renal function trend. -Outpatient follow-up with urology service recommended. -No need for hemodialysis at the moment; patient will benefit of establishing care with nephrology after discharge.  COPD (chronic obstructive pulmonary disease) (HCC) - Continue current bronchodilator management -No acute exacerbation appreciated.  GERD (gastroesophageal reflux disease) -Continue PPI -Lifestyle changes discussed with patient.  Tobacco use disorder - Cessation counseling provided -Continue nicotine patch.   Subjective: No fever, no chest pain, no nausea, no vomiting.  Reports increased in his urine output and overall improvement in prior to admission symptoms of urinary retention.  Physical Exam: Vitals:   09/23/22 1943 09/24/22 0359 09/24/22 0732 09/24/22 1213  BP: 129/72 129/68  121/73  Pulse: 71 63  61  Resp:  18  16  Temp: 98.2 F (36.8 C) 97.9 F (36.6 C)  98.3 F (36.8 C)  TempSrc: Oral Oral  Oral  SpO2: 100% 100% 100% 100%  Weight:      Height:       General exam: Alert, awake, oriented x 3; no fever, no chest pain, no nausea, no vomiting, reporting improved in his urine output and is currently afebrile. Respiratory system: Clear to auscultation. Respiratory effort normal.  Good saturation on room air. Cardiovascular system:RRR. No rubs or gallops; no JVD. Gastrointestinal system: Abdomen is nondistended, soft and nontender.  Positive bowel sounds appreciated on exam.  Central nervous system: Alert and oriented. No focal neurological deficits. Extremities: No cyanosis or clubbing. Skin: No petechiae. Psychiatry: Judgement and insight appear normal. Mood & affect appropriate.   Data Reviewed: Basic metabolic panel: Sodium 134, potassium 3.8, chloride 106, bicarb 18, BUN 38, creatinine 2.53 and GFR 29  Family  Communication: No family at bedside.  Disposition: Status is: Inpatient Remains inpatient appropriate because: Continue IV fluid resuscitation and the use of Flomax; continue to follow renal function trend.  Renal function for the most part improving but is still not back to baseline.  Continue for discharge at this time.   Planned Discharge Destination: Home   Time spent: 35 minutes  Author: Vassie Loll, MD 09/24/2022 4:21 PM  For on call review [email or web address].

## 2022-09-25 DIAGNOSIS — N320 Bladder-neck obstruction: Secondary | ICD-10-CM

## 2022-09-25 DIAGNOSIS — N1832 Chronic kidney disease, stage 3b: Secondary | ICD-10-CM

## 2022-09-25 DIAGNOSIS — E872 Acidosis, unspecified: Secondary | ICD-10-CM

## 2022-09-25 DIAGNOSIS — N179 Acute kidney failure, unspecified: Secondary | ICD-10-CM | POA: Diagnosis not present

## 2022-09-25 DIAGNOSIS — K7030 Alcoholic cirrhosis of liver without ascites: Secondary | ICD-10-CM

## 2022-09-25 DIAGNOSIS — J449 Chronic obstructive pulmonary disease, unspecified: Secondary | ICD-10-CM | POA: Diagnosis not present

## 2022-09-25 DIAGNOSIS — F17200 Nicotine dependence, unspecified, uncomplicated: Secondary | ICD-10-CM | POA: Diagnosis not present

## 2022-09-25 DIAGNOSIS — K219 Gastro-esophageal reflux disease without esophagitis: Secondary | ICD-10-CM | POA: Diagnosis not present

## 2022-09-25 LAB — RENAL FUNCTION PANEL
Albumin: 2.6 g/dL — ABNORMAL LOW (ref 3.5–5.0)
Anion gap: 10 (ref 5–15)
BUN: 33 mg/dL — ABNORMAL HIGH (ref 6–20)
CO2: 17 mmol/L — ABNORMAL LOW (ref 22–32)
Calcium: 8.3 mg/dL — ABNORMAL LOW (ref 8.9–10.3)
Chloride: 109 mmol/L (ref 98–111)
Creatinine, Ser: 2.22 mg/dL — ABNORMAL HIGH (ref 0.61–1.24)
GFR, Estimated: 34 mL/min — ABNORMAL LOW (ref >60)
Glucose, Bld: 93 mg/dL (ref 70–99)
Phosphorus: 3 mg/dL (ref 2.5–4.6)
Potassium: 4.2 mmol/L (ref 3.5–5.1)
Sodium: 136 mmol/L (ref 135–145)

## 2022-09-25 MED ORDER — SODIUM BICARBONATE 650 MG PO TABS
650.0000 mg | ORAL_TABLET | Freq: Three times a day (TID) | ORAL | Status: DC
  Administered 2022-09-25: 650 mg via ORAL

## 2022-09-25 MED ORDER — TAMSULOSIN HCL 0.4 MG PO CAPS: 0.4000 mg | ORAL_CAPSULE | Freq: Every day | ORAL | 2 refills | Status: DC

## 2022-09-25 MED ORDER — FUROSEMIDE 20 MG PO TABS: 20.0000 mg | ORAL_TABLET | Freq: Every day | ORAL | Status: DC

## 2022-09-25 MED ORDER — SODIUM BICARBONATE 650 MG PO TABS: 650.0000 mg | ORAL_TABLET | Freq: Three times a day (TID) | ORAL | 1 refills | Status: DC

## 2022-09-25 MED ORDER — SPIRONOLACTONE 50 MG PO TABS: 50.0000 mg | ORAL_TABLET | Freq: Every day | ORAL | Status: DC

## 2022-09-25 MED ORDER — NICOTINE 14 MG/24HR TD PT24: 14.0000 mg | MEDICATED_PATCH | Freq: Every day | TRANSDERMAL | 0 refills | Status: DC

## 2022-09-25 NOTE — Assessment & Plan Note (Signed)
-  Associated with renal failure -Continue treatment with sodium bicarbonate. -Advised to maintain a good hydration.

## 2022-09-25 NOTE — Assessment & Plan Note (Signed)
-  Secondary to BPH -Continue Flomax -At time of discharge patient reporting significant improvement in overall symptoms and increased urine output. -Outpatient follow-up with urology recommended.

## 2022-09-25 NOTE — Discharge Summary (Signed)
Physician Discharge Summary   Patient: [name] [name] MRN: [id number] DOB: [birth year]  Admit date:     09/22/2022  Discharge date: 09/25/22  Discharge Physician: Vassie Loll   PCP: Benetta Spar, MD   Recommendations at discharge:  Repeat basic metabolic panel to follow electrolytes and renal function Reassess blood pressure and adjust antihypertensive regimen as needed Continue assisting patient with tobacco cessation Make sure patient follow-up with urology service and nephrology as instructed.  Discharge Diagnoses: Principal Problem:   Acute renal failure superimposed on stage 3b chronic kidney disease (HCC) Active Problems:   COPD (chronic obstructive pulmonary disease) (HCC)   Tobacco use disorder   GERD (gastroesophageal reflux disease)   Metabolic acidosis   Bladder outlet obstruction  Brief hospital admission narrative: As per H&P written by Dr. Carren Rang on 09/23/22 [name] [name] is a 58 y.o. male with medical history significant of cirrhosis, hyperlipidemia, hypertension, nephrolithiasis, history of alcohol use but last drink was 5 months ago, tobacco use disorder, and more presents the ED with a chief complaint of abnormal creatinine.  Patient reports that he was at the GI doctor for routine follow-up regarding his cirrhosis.  Lab work was done and the GI doc called him and told him he had to come into the ER for increased creatinine.  Patient reports no urinary symptoms including no urgency, frequency, obstructive symptoms,, hematuria, decrease or increase in urine output, or otherwise.  Patient reports he does have some solids in his urine but that is been going on for months.  He denies any fever, flank pain.  Patient reports the only thing that is been abnormal for him in the recent history is headaches.  This started last week.  They are in the front of his head.  He has not been taking NSAIDs for them.  He has taken Tylenol 500 mg a couple of times but ran out of  that medication.  He does not have a headache at the time of our conversation.  Patient has no other complaints at this time.   Patient is a current smoker and request nicotine patch.  His last drink was 5 months ago.  He does not use illicit drugs.  He is vaccinated for COVID.  Patient would like to remain full code.  Assessment and Plan: * Acute renal failure superimposed on stage 3b chronic kidney disease (HCC) -Creatinine increased from 1.2 in January of this year, 2.8, to 3.4 at time of admission. -Creatinine at discharge 2.22 -Based on his chart review patient with chronic kidney disease stage IIIb at baseline. -Continue fluid toxic agents,Avoiding hypotension and the use of contrast -Patient Lasix/spironolactone dosage has been adjusted. -Continue close monitoring of patient's renal function trend after discharge. -Will continue the use of Flomax to alleviate bladder obstruction most likely triggered by the presence of BPH and follow-up with urology as an outpatient. -Maintain adequate hydration. -Patient will continue treatment with sodium bicarbonate for component of metabolic acidosis. -No need for hemodialysis at the moment; patient will benefit of establishing care with nephrology after discharge.  COPD (chronic obstructive pulmonary disease) (HCC) - Continue current bronchodilator management -No acute exacerbation appreciated. -Good saturation on room air appreciated.  Bladder outlet obstruction -Secondary to BPH -Continue Flomax -At time of discharge patient reporting significant improvement in overall symptoms and increased urine output. -Outpatient follow-up with urology recommended.  Metabolic acidosis -Associated with renal failure -Continue treatment with sodium bicarbonate. -Advised to maintain a good hydration.  GERD (gastroesophageal reflux disease) -Continue  PPI -Lifestyle changes discussed with patient.  Tobacco use disorder - Cessation counseling  provided -Continue nicotine patch.   Consultants: Nephrology curbside (outpatient follow-up to establish care and pursue further evaluation and management recommended). Procedures performed: See below for x-ray reports. Disposition: Home Diet recommendation: Heart healthy/low-sodium diet.   DISCHARGE MEDICATION: Allergies as of 09/25/2022   No Known Allergies      Medication List     STOP taking these medications    ondansetron 4 MG disintegrating tablet Commonly known as: ZOFRAN-ODT       TAKE these medications    albuterol 108 (90 Base) MCG/ACT inhaler Commonly known as: Proventil HFA INHALE 2 PUFFS BY MOUTH EVERY 6 HOURS AS NEEDED FOR COUGHING, WHEEZING, OR SHORTNESS OF BREATH What changed:  how much to take how to take this when to take this reasons to take this additional instructions   aspirin EC 81 MG tablet Take 81 mg by mouth daily.   cyanocobalamin 1000 MCG/ML injection Commonly known as: VITAMIN B12 Inject 1,000 mcg into the muscle every 30 (thirty) days.   Dulera 200-5 MCG/ACT Aero Generic drug: mometasone-formoterol Inhale 2 puffs into the lungs 2 (two) times daily.   furosemide 20 MG tablet Commonly known as: LASIX Take 1 tablet (20 mg total) by mouth daily. TAKE 2 TABLETS BY MOUTH IN THE MORNING AND 1 TABLET IN THE AFTERNOON Start taking on: Sep 27, 2022 What changed:  how much to take how to take this when to take this These instructions start on Sep 27, 2022. If you are unsure what to do until then, ask your doctor or other care provider.   metoprolol tartrate 25 MG tablet Commonly known as: LOPRESSOR Take 25 mg by mouth See admin instructions. 25 mg twice daily in the morning and afternoon.   nicotine 14 mg/24hr patch Commonly known as: NICODERM CQ - dosed in mg/24 hours Place 1 patch (14 mg total) onto the skin daily. Start taking on: Sep 26, 2022   pantoprazole 40 MG tablet Commonly known as: PROTONIX TAKE 1 TABLET(40 MG) BY  MOUTH DAILY What changed: See the new instructions.   sodium bicarbonate 650 MG tablet Take 1 tablet (650 mg total) by mouth 3 (three) times daily. What changed: when to take this   spironolactone 50 MG tablet Commonly known as: ALDACTONE Take 1 tablet (50 mg total) by mouth daily. Take 100 mg by mouth in morning and 50 mg in the afternoon Start taking on: Sep 26, 2022 What changed:  how much to take when to take this   tamsulosin 0.4 MG Caps capsule Commonly known as: FLOMAX Take 1 capsule (0.4 mg total) by mouth daily after supper.        Follow-up Information     Bjorn Pippin, MD. Schedule an appointment as soon as possible for a visit in 10 day(s).   Specialty: Urology Contact information: [address] [place] [facility] [address] [phone]         Terrial Rhodes, MD. Schedule an appointment as soon as possible for a visit today.   Specialty: Nephrology Why: contact office for appointment in [place] office (in the next 2-3 weeks) Contact information: [address] [place] [address] [phone]                Discharge Exam: Filed Weights   09/22/22 2256 09/23/22 0415  Weight: 81.6 kg 79.3 kg   General exam: Alert, awake, oriented x 3 Respiratory system: Clear to auscultation. Respiratory effort normal.  Cardiovascular system:RRR. No murmurs, rubs, gallops. Gastrointestinal system: Abdomen is nondistended, soft and nontender. No organomegaly or masses felt. Normal bowel sounds heard. Central nervous system: Alert and oriented. No focal neurological deficits. Extremities: No C/C/E, +pedal pulses Skin: No rashes, lesions or ulcers Psychiatry: Judgement and insight appear normal. Mood & affect appropriate.    Condition at discharge: Stable and improved.  The results of significant diagnostics from this hospitalization (including imaging, microbiology, ancillary and laboratory) are listed below for reference.   Imaging Studies: CT  Renal Stone Study  Result Date: 09/23/2022 CLINICAL DATA:  Abdominal pain. EXAM: CT ABDOMEN AND PELVIS WITHOUT CONTRAST TECHNIQUE: Multidetector CT imaging of the abdomen and pelvis was performed following the standard protocol without IV contrast. RADIATION DOSE REDUCTION: This exam was performed according to the departmental dose-optimization program which includes automated exposure control, adjustment of the mA and/or kV according to patient size and/or use of iterative reconstruction technique. COMPARISON:  May 19, 2022 FINDINGS: Lower chest: No acute abnormality. Hepatobiliary: The liver is cirrhotic in appearance. No focal liver abnormality is seen. Subcentimeter gallstones are seen within the lumen of a contracted gallbladder. Pancreas: The pancreatic head is enlarged without definite evidence of an underlying pancreatic mass. This is unchanged in severity when compared to the prior study. No pancreatic ductal dilatation or surrounding inflammatory changes. Spleen: Normal in size without focal abnormality. Adrenals/Urinary Tract: Adrenal glands are unremarkable. The right kidney is normal in size, without renal calculi, focal lesion, or hydronephrosis. The left kidney is atrophic in appearance. Mild diffuse urinary bladder wall thickening is seen. Stomach/Bowel: Stomach is within normal limits. The appendix is surgically absent. No evidence of bowel wall thickening, distention, or inflammatory changes. Noninflamed diverticula are seen throughout the large bowel. Vascular/Lymphatic: Aortic atherosclerosis. Subcentimeter para-aortic and aortocaval lymph nodes are seen. Reproductive: The prostate gland is mildly enlarged. Other: No abdominal wall hernia or abnormality. No abdominopelvic ascites. Musculoskeletal: No acute or significant osseous findings. IMPRESSION: 1. Hepatic cirrhosis. 2. Cholelithiasis. 3. Atrophic left kidney. 4. Colonic diverticulosis. 5. Aortic atherosclerosis. 6. Mild diffuse  urinary bladder wall thickening which may be, in part, secondary to chronic bladder outlet obstruction. Correlation with urinalysis is recommended to exclude the presence of acute cystitis. Aortic Atherosclerosis (ICD10-I70.0). Electronically Signed   By: Aram Candela M.D.   On: 09/23/2022 02:20   US Abdomen Limited RUQ (LIVER/GB)  Result Date: 09/15/2022 CLINICAL DATA:  Cirrhosis follow-up. EXAM: ULTRASOUND ABDOMEN LIMITED RIGHT UPPER QUADRANT COMPARISON:  Ultrasound May 22, 2022. MRI of the abdomen July 31, 2022. FINDINGS: Gallbladder: Gallbladder wall thickening measuring 4.6 mm is identified. Trace pericholecystic fluid. No Murphy's sign. Cholelithiasis noted. Common bile duct: Diameter: 6.9 mm today versus 5 mm previously Liver: Increased echogenicity. No focal mass. Portal vein is patent on color Doppler imaging with normal direction of blood flow towards the liver. Other: None. IMPRESSION: 1. Cholelithiasis with gallbladder wall thickening and trace pericholecystic fluid. No Murphy's sign. The findings are nonspecific. If there is concern for cholecystitis, recommend a HIDA scan if the clinical picture is ambiguous. 2. The common bile duct is slightly larger today measuring 6.9 mm versus 5 mm previously. Recommend correlation with LFTs. If there is concern for biliary obstruction, recommend MRCP. 3. Increased echogenicity in the liver is nonspecific but could be related to the reported history of cirrhosis or iron deposition in the liver described on the comparison MRI. Electronically Signed   By: Gerome Sam III M.D.   On: 09/15/2022 09:06   [place] ASCITES (ABDOMEN  LIMITED)  Result Date: 09/15/2022 CLINICAL DATA:  Cirrhosis follow-up. EXAM: ULTRASOUND ABDOMEN LIMITED RIGHT UPPER QUADRANT COMPARISON:  Ultrasound May 22, 2022. MRI of the abdomen July 31, 2022. FINDINGS: Gallbladder: Gallbladder wall thickening measuring 4.6 mm is identified. Trace pericholecystic fluid. No Murphy's sign.  Cholelithiasis noted. Common bile duct: Diameter: 6.9 mm today versus 5 mm previously Liver: Increased echogenicity. No focal mass. Portal vein is patent on color Doppler imaging with normal direction of blood flow towards the liver. Other: None. IMPRESSION: 1. Cholelithiasis with gallbladder wall thickening and trace pericholecystic fluid. No Murphy's sign. The findings are nonspecific. If there is concern for cholecystitis, recommend a HIDA scan if the clinical picture is ambiguous. 2. The common bile duct is slightly larger today measuring 6.9 mm versus 5 mm previously. Recommend correlation with LFTs. If there is concern for biliary obstruction, recommend MRCP. 3. Increased echogenicity in the liver is nonspecific but could be related to the reported history of cirrhosis or iron deposition in the liver described on the comparison MRI. Electronically Signed   By: Gerome Sam III M.D.   On: 09/15/2022 09:06    Microbiology: Results for orders placed or performed in visit on 09/14/22  Microscopic Examination     Status: None   Collection Time: 09/14/22  9:18 AM   Urine  Result Value Ref Range Status   WBC, UA 0-5 0 - 5 /hpf Final   RBC, Urine 0-2 0 - 2 /hpf Final   Epithelial Cells (non renal) 0-10 0 - 10 /hpf Final   Bacteria, UA None seen None seen/Few Final    Labs: CBC: Recent Labs  Lab 09/22/22 0926 09/22/22 1003 09/22/22 2350 09/23/22 0425  WBC 8.5 8.0 8.5 7.9  NEUTROABS 4.9 4.8 4.7 3.9  HGB 12.9* 12.6* 12.0* 12.4*  HCT 40.8 39.2 36.9* 38.9*  MCV 90.7 90.1 89.6 90.0  PLT 347 320 334 323   Basic Metabolic Panel: Recent Labs  Lab 09/22/22 1003 09/22/22 2350 09/23/22 0425 09/24/22 0813 09/25/22 0415  NA 134* 132* 133* 134* 136  K 4.0 3.6 3.8 3.8 4.2  CL 104 101 101 106 109  CO2 20* 17* 21* 18* 17*  GLUCOSE 129* 106* 100* 139* 93  BUN 38* 41* 41* 38* 33*  CREATININE 3.44* 3.43* 3.42* 2.53* 2.22*  CALCIUM 8.3* 8.2* 8.3* 8.2* 8.3*  MG  --   --  1.7  --   --   PHOS   --   --   --   --  3.0   Liver Function Tests: Recent Labs  Lab 09/22/22 0926 09/22/22 1003 09/22/22 2350 09/23/22 0425 09/25/22 0415  AST 27 27 25 27   --   ALT 14 14 14 16   --   ALKPHOS 124 120 100 105  --   BILITOT 0.4 0.3 0.6 0.2*  --   PROT 8.5* 8.3* 8.3* 8.1  --   ALBUMIN 3.1* 3.0* 3.1* 2.9* 2.6*   CBG: No results for input(s): "GLUCAP" in the last 168 hours.  Discharge time spent: greater than 30 minutes.  Signed: Vassie Loll, MD [facility] 09/25/2022

## 2022-09-26 LAB — METHYLMALONIC ACID, SERUM: Methylmalonic Acid, Quantitative: 515 nmol/L — ABNORMAL HIGH (ref 0–378)

## 2022-09-28 NOTE — Progress Notes (Signed)
[facility] [address], [place] [address]   CLINIC:  Medical Oncology/Hematology  PCP:  Benetta Spar, MD [address] [place] [address] [phone]   REASON FOR VISIT:  Follow-up for elevated ferritin (H63D heterozygous) and normocytic anemia   PRIOR THERAPY: None   CURRENT THERAPY: B12 injections  INTERVAL HISTORY:   [name] 57 y.o. male returns for routine follow-up of elevated ferritin and normocytic anemia.  He was last seen by Rojelio Brenner PA-C on 03/15/2022.    Since his last visit, he was hospitalized twice: 05/19/2022 through 05/24/2022: AKI on CKD stage IIIb, acute pancreatitis secondary to cholelithiasis and alcohol abuse, Aerococcus UTI. 09/22/2022 through 09/25/2022: AKI on CKD stage IIIb secondary to hypovolemia and aspect of bladder obstruction from BPH.   At today's visit, he reports waxing and waning energy level with some chronic fatigue (energy about 50%).   He denies any abdominal pain, but does report some abdominal distention and tightness.  No abnormal joint pain or skin changes.  He denies any excessive iron intake such as iron supplementation, organ meats, or large amounts of red meat.  He significantly cut back on alcohol consumption in February 2022, but will still drink on occasion.  He reports that his last drink was in January 2024.  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   He has bilateral leg swelling, but denies any unilateral leg swelling or other signs or symptoms of current DVT or PE.  He denies any epistaxis, hematemesis, hematochezia, or melena.  He has 50% energy and 70% appetite.  His weight fluctuates related to fluid overload and intermittent paracentesis, but overall is stable within the same 10 to 15 pound range.   ASSESSMENT & PLAN:  1.  Elevated ferritin with hemosiderosis, H63D heterozygous - Previously diagnosed with alcoholic  liver cirrhosis, and was found to have elevated ferritin 1,254 on 07/20/2020 (normal ferritin noted in 2018). - Significantly cut back on alcohol in February 2022, but will continue to drink on occasion.  Reports last alcoholic drink was in January 2956. - Genetic testing showed patient to be H63D heterozygote for hemochromatosis gene - MRI abdomen (06/29/2021): Hepatic parenchymal signal suggestive of hemosiderosis - MRI abdomen (07/31/2022): Hepatic and splenic iron deposition. - Iron trends: Elevated ferritin since March 2022, with ferritin as high as 1477 (05/22/2022) Elevated iron saturation since at least 2018, as high as 98% saturation (08/06/2020) - Most recent iron panel (09/22/2022) surprisingly shows normal ferritin at 217, with iron saturation 15%.  (Repeat labs on 09/29/2022 confirmed ferritin 210, iron saturation 24%.) - Patient follows with NP Lewie Loron [facility]) as well as NP [name] ([facility]) - Discussed extensively with Dr. Ellin Saba (supervising physician): Elevated ferritin thought most likely to be due to liver disease rather than H63D heterozygosity, since alcoholic liver disease is associated with increased iron deposition in the liver.  Additionally, H63D heterozygosity is unlikely to cause increased ferritin that would be high enough to cause primary liver damage.  Patient is not felt to be a candidate for phlebotomy due to his anemia.  No indication for chelating agents with his current ferritin < 1,000.  However, it is somewhat difficult to determine whether elevated ferritin caused liver cirrhosis versus liver cirrhosis causing elevated ferritin.  If liver biopsy were to show worsening cirrhosis definitively caused by increased iron deposition in hepatocytes, we would certainly consider chelation therapy. - PLAN:  Elevated ferritin likely due to liver disease more so than H63D heterozygosity.  Due to hepatic and splenic iron deposition on MRI from April  2024, we blood consider additional imaging to quantify iron levels. However, since most recent labs show normal ferritin and iron saturation, we will hold off on additional imaging for now. Will recheck ferritin and iron panel (labs only) in 3 months and consider scheduling MRI at that time if he has any recurrent elevations in his iron levels.   Per Dr. Ellin Saba, consider chelating agent if moderate to severe iron deposition, in order to minimize further liver damage.  (Patient would need MD visit prior to starting chelating therapy.) - We will tentatively plan RTC in 6 months, unless 27-month labs show significant increase in ferritin or iron saturation. - Patient counseled to continue to abstain from alcohol and to avoid excessive dietary iron.    2. Left lower extremity DVT - Patient being followed by vascular team for extensive left lower extremity DVT diagnosed 08/04/2020 - beginning at the level of the left external iliac vein origin and extending into the common femoral, superficial femoral, and deep femoral veins to the level of the popliteal vein - Ultrasound-guided LLE thrombectomy performed by Dr. Lemar Livings of vascular surgery - Was on Eliquis since 08/04/2020 through 02/10/2021. - Eliquis discontinued on 02/10/2021 due to anemia with concern for possible GI bleed, was placed on aspirin instead by the recommendation of Dr. Randie Heinz - PLAN: Continue aspirin and follow-up as directed by vascular team.   3. Normocytic anemia - Intermittent anemia with Hgb ranging from 7.5 hospitalization in October 2022) to normal over the past 12 months - Hematology work-up: SPEP and IFE showed polyclonal increase in immunoglobulins, and increased kappa/lambda light chains with normal ratio.  No M spike or monoclonal protein apparent. Reticulocytes and erythropoietin normal.  LDH mildly elevated.  Bilirubin normal. B12 and folic acid deficiency (see below) CMP with creatinine 1.77/GFR 45 (CKD stage IIIb) -  EGD (07/22/2020) with normal esophagus, small hiatal hernia, abnormal gastric mucosa, abnormal appearing ampula and periampullary mucosa. Mild chronic gastritis. - EGD (09/08/2021): Limited due to retained food in stomach/duodenum, but normal esophagus without any varices per report - Colonoscopy (04/13/2022): Diverticulosis, polyps x 9 (tubular adenoma) - Most recent CBC (09/22/2022): Hgb 12.9/MCV 90.7.  Additional labs show ferritin 217, iron saturation 15%.  B12 and folic acid improved as below. - He denies any overt signs of bleeding - He is symptomatic only with mild chronic fatigue - DIFFERENTIAL DIAGNOSIS favors anemia of chronic disease/CKD IIIb, further complicated by B12 and folic acid deficiency as below - PLAN: B12 and folic acid supplementation as below.  Otherwise, no indication for treatment at this time, but we will continue to monitor with repeat labs and RTC in 6 months.  We will consider initiation of ESA if Hgb less than 10.0. - Continue nephrology follow-up with Dr. Bufford Buttner   4.  Folate & vitamin B12 deficiency - Labs from November 2023 showed B12 deficiency (B12 138, MMA 447) and folate deficiency (folate 4.4, homocystine 42.2). - Patient has been taking folic acid 400 mcg daily since November 2023 - Vitamin B12 injections since November 2023  - Most recent labs (09/22/2022): B12 687, MMA elevated at 515.  Folate 13.2, homocystine elevated at 18.9 (improved). - Suspect malabsorption in the setting of chronic alcoholism. - PLAN: Continue folic acid 400 mcg daily. - Continue monthly B12 injections.   - We will recheck B12 and folic acid levels  in about 6 months.     5.  Peripheral neuropathy - Patient reports worsening numbness and tingling in his hands and feet - Denies any history of diabetes - SPEP/immunofixation checked in 2023 was negative for monoclonal gammopathy - He struggles with chronic alcoholism and is known to have other nutritional deficiencies (folate  and B12, as above) - PLAN: We will check additional labs today (vitamin E, vitamin B1, vitamin B6) to look for other nutritional deficiencies that could be causing alcohol induced peripheral neuropathy. - If additional nutritional labs are normal, consider referral to neurology for additional workup.   6.  Other history - His PMH is also notable for COPD in addition to alcoholic liver cirrhosis and LLE DVT noted above. -The patient is a recovering alcoholic - previously drank 12 beers per day for 20 + years, significantly cut back in February 2022, but continues to drink on occasion - He is an everyday tobacco user, smoking 0.5 PPD for the past 30 years.   - He denies illicit drug use.   - He is on disability and lives at home with his fianc. - No family history of liver disease or blood clots.  Father had throat cancer secondary to smoking.  No family hemochromatosis history.      PLAN SUMMARY: >> Labs today = vitamin B1, vitamin B6, vitamin E >> Continue monthly B12 injections  >> Patient has GI follow-up in August 2024 - will request that his CBC/D, ferritin, and iron/TIBC be checked at that time.  We will follow results. >> Labs in 6 months = CBC/D, CMP, ferritin, iron/TIBC, B12, MMA, folate, homocystine >> OFFICE visit in 6 months (1 week after labs)     REVIEW OF SYSTEMS:   Review of Systems  Constitutional:  Positive for fatigue. Negative for appetite change, chills, diaphoresis, fever and unexpected weight change.  HENT:   Negative for lump/mass and nosebleeds.   Eyes:  Negative for eye problems.  Respiratory:  Positive for cough and shortness of breath. Negative for hemoptysis.   Cardiovascular:  Negative for chest pain, leg swelling and palpitations.  Gastrointestinal:  Negative for abdominal pain, blood in stool, constipation, diarrhea, nausea and vomiting.  Genitourinary:  Negative for hematuria.   Skin: Negative.   Neurological:  Positive for headaches and numbness.  Negative for dizziness and light-headedness.  Hematological:  Does not bruise/bleed easily.     PHYSICAL EXAM:  ECOG PERFORMANCE STATUS: 1 - Symptomatic but completely ambulatory  There were no vitals filed for this visit. There were no vitals filed for this visit. Physical Exam Constitutional:      Appearance: Normal appearance. He is normal weight.  Cardiovascular:     Heart sounds: Normal heart sounds.  Pulmonary:     Breath sounds: Normal breath sounds.  Neurological:     General: No focal deficit present.     Mental Status: Mental status is at baseline.  Psychiatric:        Behavior: Behavior normal. Behavior is cooperative.     PAST MEDICAL/SURGICAL HISTORY:  Past Medical History:  Diagnosis Date   Asthma    B12 deficiency 03/15/2022   Cirrhosis (HCC)    Dyspnea    High cholesterol    History of kidney stones    Hypertension    Kidney stones    Past Surgical History:  Procedure Laterality Date   APPENDECTOMY     BIOPSY  07/22/2020   Procedure: BIOPSY;  Surgeon: Corbin Ade, MD;  Location: [facility]  ENDO SUITE;  Service: Endoscopy;;   COLONOSCOPY WITH PROPOFOL N/A 04/18/2017   non-bleeding internal hemorrhoids, two 4-6 mm polyps in descending colon and cecum, pancolonic diverticulosis, single cecal AVM. Tubular adenomas, surveillance in 2023.    COLONOSCOPY WITH PROPOFOL N/A 04/13/2022   pancolonic diverticulosis, multiple polyps, one which was 11 mm (tubular adenomas). Surveillance in 3 years.   ESOPHAGOGASTRODUODENOSCOPY (EGD) WITH PROPOFOL N/A 07/22/2020   normal esophagus, small hiatal hernia, abnormal gastric mucosa, abnormal appearing ampula and periampullary mucosa. Mild chronic gastritis.   ESOPHAGOGASTRODUODENOSCOPY (EGD) WITH PROPOFOL N/A 09/08/2021   normal esophagus, retained food in stomach and duodenum precluded complete examination.   FRACTURE SURGERY     left arm   IR PARACENTESIS  05/31/2021   LOWER EXTREMITY VENOGRAPHY N/A 08/10/2020    Procedure: LOWER EXTREMITY VENOGRAPHY;  Surgeon: Maeola Harman, MD;  Location: [facility] INVASIVE CV LAB;  Service: Cardiovascular;  Laterality: N/A;   PERIPHERAL VASCULAR BALLOON ANGIOPLASTY Left 08/10/2020   Procedure: PERIPHERAL VASCULAR BALLOON ANGIOPLASTY;  Surgeon: Maeola Harman, MD;  Location: [facility] INVASIVE CV LAB;  Service: Cardiovascular;  Laterality: Left;  lower extremity venous   PERIPHERAL VASCULAR THROMBECTOMY N/A 08/10/2020   Procedure: PERIPHERAL VASCULAR THROMBECTOMY;  Surgeon: Maeola Harman, MD;  Location: [facility] INVASIVE CV LAB;  Service: Cardiovascular;  Laterality: N/A;   POLYPECTOMY  04/18/2017   Procedure: POLYPECTOMY;  Surgeon: Corbin Ade, MD;  Location: [facility] ENDO SUITE;  Service: Endoscopy;;   POLYPECTOMY  04/13/2022   Procedure: POLYPECTOMY;  Surgeon: Corbin Ade, MD;  Location: [facility] ENDO SUITE;  Service: Endoscopy;;    SOCIAL HISTORY:  Social History   Socioeconomic History   Marital status: Single    Spouse name: Not on file   Number of children: Not on file   Years of education: Not on file   Highest education level: Not on file  Occupational History   Not on file  Tobacco Use   Smoking status: Every Day    Packs/day: 0.50    Years: 33.00    Additional pack years: 0.00    Total pack years: 16.50    Types: Cigarettes    Passive exposure: Current   Smokeless tobacco: Never  Vaping Use   Vaping Use: Never used  Substance and Sexual Activity   Alcohol use: Not Currently    Comment: last deink 1 week ago   Drug use: No   Sexual activity: Yes    Birth control/protection: None  Other Topics Concern   Not on file  Social History Narrative   Not on file   Social Determinants of Health   Financial Resource Strain: High Risk (09/13/2020)   Overall Financial Resource Strain (CARDIA)    Difficulty of Paying Living Expenses: Very hard  Food Insecurity: No Food Insecurity (09/23/2022)   Hunger Vital Sign    Worried About  Running Out of Food in the Last Year: Never true    Ran Out of Food in the Last Year: Never true  Transportation Needs: No Transportation Needs (09/23/2022)   PRAPARE - Administrator, Civil Service (Medical): No    Lack of Transportation (Non-Medical): No  Physical Activity: Sufficiently Active (09/13/2020)   Exercise Vital Sign    Days of Exercise per Week: 7 days    Minutes of Exercise per Session: 30 min  Stress: No Stress Concern Present (09/13/2020)   [employer] of Occupational Health - Occupational Stress Questionnaire    Feeling of Stress : Only a little  Social Connections: Moderately Isolated (09/13/2020)   Social Connection and Isolation Panel [NHANES]    Frequency of Communication with Friends and Family: More than three times a week    Frequency of Social Gatherings with Friends and Family: Once a week    Attends Religious Services: More than 4 times per year    Active Member of [employer] or Organizations: No    Attends Banker Meetings: Never    Marital Status: Never married  Intimate Partner Violence: Unknown (09/23/2022)   Humiliation, Afraid, Rape, and Kick questionnaire    Fear of Current or Ex-Partner: No    Emotionally Abused: No    Physically Abused: No    Sexually Abused: Patient declined    FAMILY HISTORY:  Family History  Problem Relation Age of Onset   Cancer Father        throat   Diabetes Sister    Colon cancer Neg Hx     CURRENT MEDICATIONS:  Outpatient Encounter Medications as of 09/29/2022  Medication Sig   albuterol (PROVENTIL HFA) 108 (90 Base) MCG/ACT inhaler INHALE 2 PUFFS BY MOUTH EVERY 6 HOURS AS NEEDED FOR COUGHING, WHEEZING, OR SHORTNESS OF BREATH (Patient taking differently: Inhale 2 puffs into the lungs every 6 (six) hours as needed for shortness of breath or wheezing.)   aspirin EC 81 MG tablet Take 81 mg by mouth daily.   cyanocobalamin (VITAMIN B12) 1000 MCG/ML injection Inject 1,000 mcg into the muscle every  30 (thirty) days.   furosemide (LASIX) 20 MG tablet Take 1 tablet (20 mg total) by mouth daily. TAKE 2 TABLETS BY MOUTH IN THE MORNING AND 1 TABLET IN THE AFTERNOON   metoprolol tartrate (LOPRESSOR) 25 MG tablet Take 25 mg by mouth See admin instructions. 25 mg twice daily in the morning and afternoon.   mometasone-formoterol (DULERA) 200-5 MCG/ACT AERO Inhale 2 puffs into the lungs 2 (two) times daily.   nicotine (NICODERM CQ - DOSED IN MG/24 HOURS) 14 mg/24hr patch Place 1 patch (14 mg total) onto the skin daily.   pantoprazole (PROTONIX) 40 MG tablet TAKE 1 TABLET(40 MG) BY MOUTH DAILY (Patient taking differently: Take 40 mg by mouth daily.)   sodium bicarbonate 650 MG tablet Take 1 tablet (650 mg total) by mouth 3 (three) times daily.   spironolactone (ALDACTONE) 50 MG tablet Take 1 tablet (50 mg total) by mouth daily. Take 100 mg by mouth in morning and 50 mg in the afternoon   tamsulosin (FLOMAX) 0.4 MG CAPS capsule Take 1 capsule (0.4 mg total) by mouth daily after supper.   No facility-administered encounter medications on file as of 09/29/2022.    ALLERGIES:  No Known Allergies  LABORATORY DATA:  I have reviewed the labs as listed.  CBC    Component Value Date/Time   WBC 7.9 09/23/2022 0425   RBC 4.32 09/23/2022 0425   HGB 12.4 (L) 09/23/2022 0425   HCT 38.9 (L) 09/23/2022 0425   PLT 323 09/23/2022 0425   MCV 90.0 09/23/2022 0425   MCH 28.7 09/23/2022 0425   MCHC 31.9 09/23/2022 0425   RDW 13.7 09/23/2022 0425   LYMPHSABS 2.6 09/23/2022 0425   MONOABS 1.1 (H) 09/23/2022 0425   EOSABS 0.3 09/23/2022 0425   BASOSABS 0.1 09/23/2022 0425      Latest Ref Rng & Units 09/25/2022    4:15 AM 09/24/2022    8:13 AM 09/23/2022    4:25 AM  CMP  Glucose 70 - 99 mg/dL 93  161  100   BUN 6 - 20 mg/dL 33  38  41   Creatinine 0.61 - 1.24 mg/dL 6.96  2.95  2.84   Sodium 135 - 145 mmol/L 136  134  133   Potassium 3.5 - 5.1 mmol/L 4.2  3.8  3.8   Chloride 98 - 111 mmol/L 109  106  101    CO2 22 - 32 mmol/L 17  18  21    Calcium 8.9 - 10.3 mg/dL 8.3  8.2  8.3   Total Protein 6.5 - 8.1 g/dL   8.1   Total Bilirubin 0.3 - 1.2 mg/dL   0.2   Alkaline Phos 38 - 126 U/L   105   AST 15 - 41 U/L   27   ALT 0 - 44 U/L   16     DIAGNOSTIC IMAGING:  I have independently reviewed the relevant imaging and discussed with the patient.   WRAP UP:  All questions were answered. The patient knows to call the clinic with any problems, questions or concerns.  Medical decision making: Moderate  Time spent on visit: I spent 20 minutes counseling the patient face to face. The total time spent in the appointment was 30 minutes and more than 50% was on counseling.  Carnella Guadalajara, PA-C  09/29/22 6:42 PM

## 2022-09-29 ENCOUNTER — Inpatient Hospital Stay: Payer: Medicaid Other

## 2022-09-29 ENCOUNTER — Inpatient Hospital Stay (HOSPITAL_BASED_OUTPATIENT_CLINIC_OR_DEPARTMENT_OTHER): Payer: Medicaid Other | Admitting: Physician Assistant

## 2022-09-29 VITALS — BP 142/79 | HR 67 | Temp 98.9°F | Resp 16 | Wt 179.0 lb

## 2022-09-29 DIAGNOSIS — G629 Polyneuropathy, unspecified: Secondary | ICD-10-CM | POA: Diagnosis not present

## 2022-09-29 DIAGNOSIS — N1832 Chronic kidney disease, stage 3b: Secondary | ICD-10-CM

## 2022-09-29 DIAGNOSIS — R7989 Other specified abnormal findings of blood chemistry: Secondary | ICD-10-CM | POA: Diagnosis not present

## 2022-09-29 DIAGNOSIS — D631 Anemia in chronic kidney disease: Secondary | ICD-10-CM

## 2022-09-29 DIAGNOSIS — G621 Alcoholic polyneuropathy: Secondary | ICD-10-CM | POA: Diagnosis not present

## 2022-09-29 DIAGNOSIS — E538 Deficiency of other specified B group vitamins: Secondary | ICD-10-CM | POA: Diagnosis present

## 2022-09-29 DIAGNOSIS — Z8719 Personal history of other diseases of the digestive system: Secondary | ICD-10-CM | POA: Diagnosis not present

## 2022-09-29 DIAGNOSIS — I129 Hypertensive chronic kidney disease with stage 1 through stage 4 chronic kidney disease, or unspecified chronic kidney disease: Secondary | ICD-10-CM | POA: Diagnosis not present

## 2022-09-29 DIAGNOSIS — K5730 Diverticulosis of large intestine without perforation or abscess without bleeding: Secondary | ICD-10-CM | POA: Diagnosis not present

## 2022-09-29 DIAGNOSIS — Z86718 Personal history of other venous thrombosis and embolism: Secondary | ICD-10-CM | POA: Diagnosis not present

## 2022-09-29 DIAGNOSIS — Z800 Family history of malignant neoplasm of digestive organs: Secondary | ICD-10-CM | POA: Diagnosis not present

## 2022-09-29 DIAGNOSIS — K7030 Alcoholic cirrhosis of liver without ascites: Secondary | ICD-10-CM | POA: Diagnosis not present

## 2022-09-29 DIAGNOSIS — D649 Anemia, unspecified: Secondary | ICD-10-CM | POA: Diagnosis not present

## 2022-09-29 DIAGNOSIS — R768 Other specified abnormal immunological findings in serum: Secondary | ICD-10-CM | POA: Diagnosis not present

## 2022-09-29 DIAGNOSIS — F17210 Nicotine dependence, cigarettes, uncomplicated: Secondary | ICD-10-CM | POA: Diagnosis not present

## 2022-09-29 LAB — IRON AND TIBC
Iron: 61 ug/dL (ref 45–182)
Saturation Ratios: 24 % (ref 17.9–39.5)
TIBC: 256 ug/dL (ref 250–450)
UIBC: 195 ug/dL

## 2022-09-29 LAB — FERRITIN: Ferritin: 210 ng/mL (ref 24–336)

## 2022-09-29 NOTE — Patient Instructions (Signed)
[facility] at [facility] Discharge Instructions  You were seen today by Rojelio Brenner PA-C for your anemia and your high iron levels.  ANEMIA: Your anemia (low blood levels) related to your chronic kidney disease and your vitamin deficiencies. Your blood levels look much better! Continue monthly vitamin B12 injections. Continue folic acid 400 mcg every day.  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 Your most recent MRI showed increased liver in your liver and spleen. We will recheck your iron levels today to see if you need to start a medication to decrease your iron. We may need to schedule you for another MRI, but we will call to let you know if this is necessary.  NEUROPATHY ("numbness and tingling"): Numbness and tingling in your hands and feet can be caused by many different conditions, but is frequently associated with chronic alcohol use. We will check some additional vitamin levels today to see if you have any vitamin deficiencies that could be causing the numbness and tingling. Continue to keep up the great work on staying away from alcohol!  FOLLOW-UP APPOINTMENT: Repeat labs in 6 months with office visit the week after labs  ** Thank you for trusting me with your healthcare!  I strive to provide all of my patients with quality care at each visit.  If you receive a survey for this visit, I would be so grateful to you for taking the time to provide feedback.  Thank you in advance!  ~ Donie Lemelin                   Dr. Doreatha Massed   &   Rojelio Brenner, PA-C   - - - - - - - - - - - - - - - - - -     Thank you for choosing [facility] at [facility] to provide your oncology and hematology care.  To afford each patient quality time with our provider, please arrive at least 15 minutes before your scheduled appointment time.   If you have a lab appointment with the  Cancer Center please come in thru the Main Entrance and check in at the main information desk.  You need to re-schedule your appointment should you arrive 10 or more minutes late.  We strive to give you quality time with our providers, and arriving late affects you and other patients whose appointments are after yours.  Also, if you no show three or more times for appointments you may be dismissed from the clinic at the providers discretion.     Again, thank you for choosing [facility].  Our hope is that these requests will decrease the amount of time that you wait before being seen by our physicians.       _____________________________________________________________  Should you have questions after your visit to [facility], please contact our office at [phone] and follow the prompts.  Our office hours are 8:00 a.m. and 4:30 p.m. Monday - Friday.  Please note that voicemails left after 4:00 p.m. may not be returned until the following business day.  We are closed weekends and major holidays.  You do have access to a nurse 24-7, just call the main number to the clinic [phone] and do not press any options, hold on the line and a nurse will answer the phone.    For prescription refill requests, have your  pharmacy contact our office and allow 72 hours.    Due to Covid, you will need to wear a mask upon entering the hospital. If you do not have a mask, a mask will be given to you at the Main Entrance upon arrival. For doctor visits, patients may have 1 support person age 79 or older with them. For treatment visits, patients can not have anyone with them due to social distancing guidelines and our immunocompromised population.

## 2022-10-03 LAB — VITAMIN E
Vitamin E (Alpha Tocopherol): 8.2 mg/L (ref 7.0–25.1)
Vitamin E(Gamma Tocopherol): 1.5 mg/L (ref 0.5–5.5)

## 2022-10-04 LAB — VITAMIN B6: Vitamin B6: 2.6 ug/L — ABNORMAL LOW (ref 3.4–65.2)

## 2022-10-04 LAB — VITAMIN B1: Vitamin B1 (Thiamine): 126.2 nmol/L (ref 66.5–200.0)

## 2022-10-06 ENCOUNTER — Telehealth: Payer: Self-pay | Admitting: Physician Assistant

## 2022-10-06 DIAGNOSIS — E531 Pyridoxine deficiency: Secondary | ICD-10-CM

## 2022-10-06 MED ORDER — VITAMIN B-6 25 MG PO TABS
ORAL_TABLET | ORAL | 0 refills | Status: AC
Start: 2022-10-06 — End: 2023-04-25

## 2022-10-06 NOTE — Telephone Encounter (Signed)
At most recent office visit, Mr. Keisler noted increasing paresthesias in his extremities. Nutritional panel was checked, which revealed vitamin B6 (pyridoxine) deficiency. Prescription sent to pharmacy for vitamin B6 supplement 100 mg daily x 3 weeks, followed by 25 mg daily thereafter.  We will recheck his vitamin B6-pyridoxine levels at his next follow-up visit.  The patient was unable to be reached to discuss the above information, but I was able to get a hold of his aunt (per instructions in chart).  She verbalized understanding and agreement of the above information, which she will pass on to patient when she sees him.  Patient advised to call our office if he has any additional questions or concerns.  Carnella Guadalajara, PA-C  10/06/22 4:06 PM

## 2022-10-10 ENCOUNTER — Ambulatory Visit (INDEPENDENT_AMBULATORY_CARE_PROVIDER_SITE_OTHER): Payer: Medicaid Other | Admitting: Gastroenterology

## 2022-10-10 ENCOUNTER — Encounter: Payer: Self-pay | Admitting: Gastroenterology

## 2022-10-10 VITALS — BP 123/77 | HR 71 | Temp 98.1°F | Ht 72.0 in | Wt 178.6 lb

## 2022-10-10 DIAGNOSIS — K7460 Unspecified cirrhosis of liver: Secondary | ICD-10-CM

## 2022-10-10 NOTE — Patient Instructions (Signed)
I am glad you are doing well!  I am getting the records from the kidney doctor.  Please keep appointment for August.  Continue to stick with a low salt diet, no alcohol, and call us if any concerns!  I enjoyed seeing you again today! I value our relationship and want to provide genuine, compassionate, and quality care. You may receive a survey regarding your visit with me, and I welcome your feedback! Thanks so much for taking the time to complete this. I look forward to seeing you again.      Gelene Mink, PhD, ANP-BC [facility]

## 2022-10-10 NOTE — Progress Notes (Signed)
Gastroenterology Office Note     Primary Care Physician:  Benetta Spar, MD  Primary Gastroenterologist: Dr. Jena Gauss    Chief Complaint   Chief Complaint  Patient presents with   Follow-up    Follow up after ED (for 3 days)     History of Present Illness   [name] [name] is a 57 y.o. male presenting today with a history of  cirrhosis due to ETOH, followed by Liver clinic in [place] as well [name], FNP).  EGD May 2023 with normal esophagus but retained food in stomach and duodenum precluded exam, pancreatitis due to alcohol and gallstones Jan 2024, history of gallbladder polyp, returning for follow-up today.    Repeat MRI pancreas with resolution of pancreatitis.  Hepatic and splenic iron deposition noted. He has chronically elevated ferritin and iron sats previously felt to be due to liver disease and alcohol use. 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 Hematology is aware and following as well with plans for additional imaging if persistently elevated indices. Consider chelating agent if moderate to severe iron deposition. Most recent iron studies with completely normal ferritin (210) and iron sats 24. Hematology holding off on repeating MRI or chelating agents for time being. Will need to update labs in August when we see him again.   Inpatient May 2024 with ARF on CKD. Lasix and aldactone adjusted.  May 2024 [place]. Next due in Nov 2024.  May 2024 AFP, next in Nov 2024.   Lasix 40 mg in am and 20 mg in afternoon, then spironolactone 100 mg in am and 50 mg in pm. Saw  [facility] last week.   No alcohol intake for 4-5 months. Following low salt diet. Makes sure to look at labels. No confusion or mental status changes. Some fatigue. Vit B 6 low and waiting on prescription to be filled from pharmacy for supplement. Saw [facility] last week.    EGD May 2023: normal esophagus but retained food in  stomach and duodenum precluded exam. Surveillance 2025   Colonoscopy Dec 2023: pancolonic diverticulosis, multiple polyps, one which was 11 mm (tubular adenomas). Surveillance in 3 years.      Past Medical History:  Diagnosis Date   Asthma    B12 deficiency 03/15/2022   Cirrhosis (HCC)    Dyspnea    High cholesterol    History of kidney stones    Hypertension    Kidney stones     Past Surgical History:  Procedure Laterality Date   APPENDECTOMY     BIOPSY  07/22/2020   Procedure: BIOPSY;  Surgeon: Corbin Ade, MD;  [facility];  Service: Endoscopy;;   COLONOSCOPY WITH PROPOFOL N/A 04/18/2017   non-bleeding internal hemorrhoids, two 4-6 mm polyps in descending colon and cecum, pancolonic diverticulosis, single cecal AVM. Tubular adenomas, surveillance in 2023.    COLONOSCOPY WITH PROPOFOL N/A 04/13/2022   pancolonic diverticulosis, multiple polyps, one which was 11 mm (tubular adenomas). Surveillance in 3 years.   ESOPHAGOGASTRODUODENOSCOPY (EGD) WITH PROPOFOL N/A 07/22/2020   normal esophagus, small hiatal hernia, abnormal gastric mucosa, abnormal appearing ampula and periampullary mucosa. Mild chronic gastritis.   ESOPHAGOGASTRODUODENOSCOPY (EGD) WITH PROPOFOL N/A 09/08/2021   normal esophagus, retained food in stomach and duodenum precluded complete examination.   FRACTURE SURGERY     left arm   IR PARACENTESIS  05/31/2021   LOWER EXTREMITY VENOGRAPHY N/A 08/10/2020   Procedure: LOWER EXTREMITY VENOGRAPHY;  Surgeon:  Maeola Harman, MD;  Location: [facility] INVASIVE CV LAB;  Service: Cardiovascular;  Laterality: N/A;   PERIPHERAL VASCULAR BALLOON ANGIOPLASTY Left 08/10/2020   Procedure: PERIPHERAL VASCULAR BALLOON ANGIOPLASTY;  Surgeon: Maeola Harman, MD;  Location: [facility] INVASIVE CV LAB;  Service: Cardiovascular;  Laterality: Left;  lower extremity venous   PERIPHERAL VASCULAR THROMBECTOMY N/A 08/10/2020   Procedure: PERIPHERAL VASCULAR  THROMBECTOMY;  Surgeon: Maeola Harman, MD;  Location: [facility] INVASIVE CV LAB;  Service: Cardiovascular;  Laterality: N/A;   POLYPECTOMY  04/18/2017   Procedure: POLYPECTOMY;  Surgeon: Corbin Ade, MD;  [facility];  Service: Endoscopy;;   POLYPECTOMY  04/13/2022   Procedure: POLYPECTOMY;  Surgeon: Corbin Ade, MD;  [facility];  Service: Endoscopy;;    Current Outpatient Medications  Medication Sig Dispense Refill   albuterol (PROVENTIL HFA) 108 (90 Base) MCG/ACT inhaler INHALE 2 PUFFS BY MOUTH EVERY 6 HOURS AS NEEDED FOR COUGHING, WHEEZING, OR SHORTNESS OF BREATH (Patient taking differently: Inhale 2 puffs into the lungs every 6 (six) hours as needed for shortness of breath or wheezing.) 18 g 1   aspirin EC 81 MG tablet Take 81 mg by mouth daily.     cyanocobalamin (VITAMIN B12) 1000 MCG/ML injection Inject 1,000 mcg into the muscle every 30 (thirty) days.     furosemide (LASIX) 20 MG tablet Take 1 tablet (20 mg total) by mouth daily. TAKE 2 TABLETS BY MOUTH IN THE MORNING AND 1 TABLET IN THE AFTERNOON     metoprolol tartrate (LOPRESSOR) 25 MG tablet Take 25 mg by mouth See admin instructions. 25 mg twice daily in the morning and afternoon.     mometasone-formoterol (DULERA) 200-5 MCG/ACT AERO Inhale 2 puffs into the lungs 2 (two) times daily. 13 g 3   pantoprazole (PROTONIX) 40 MG tablet TAKE 1 TABLET(40 MG) BY MOUTH DAILY (Patient taking differently: Take 40 mg by mouth daily.) 90 tablet 3   sodium bicarbonate 650 MG tablet Take 1 tablet (650 mg total) by mouth 3 (three) times daily. 90 tablet 1   spironolactone (ALDACTONE) 50 MG tablet Take 1 tablet (50 mg total) by mouth daily. Take 100 mg by mouth in morning and 50 mg in the afternoon     tamsulosin (FLOMAX) 0.4 MG CAPS capsule Take 1 capsule (0.4 mg total) by mouth daily after supper. 30 capsule 2   pyridOXINE (VITAMIN B6) 25 MG tablet Take 4 tablets (100 mg total) by mouth daily for 21 days, THEN 1  tablet (25 mg total) daily. (Patient not taking: Reported on 10/10/2022) 264 tablet 0   No current facility-administered medications for this visit.    Allergies as of 10/10/2022   (No Known Allergies)    Family History  Problem Relation Age of Onset   Cancer Father        throat   Diabetes Sister    Colon cancer Neg Hx     Social History   Socioeconomic History   Marital status: Single    Spouse name: Not on file   Number of children: Not on file   Years of education: Not on file   Highest education level: Not on file  Occupational History   Not on file  Tobacco Use   Smoking status: Every Day    Packs/day: 0.50    Years: 33.00    Additional pack years: 0.00    Total pack years: 16.50    Types: Cigarettes    Passive exposure: Current  Smokeless tobacco: Never  Vaping Use   Vaping Use: Never used  Substance and Sexual Activity   Alcohol use: Not Currently    Comment: last deink 1 week ago   Drug use: No   Sexual activity: Yes    Birth control/protection: None  Other Topics Concern   Not on file  Social History Narrative   Not on file   Social Determinants of Health   Financial Resource Strain: High Risk (09/13/2020)   Overall Financial Resource Strain (CARDIA)    Difficulty of Paying Living Expenses: Very hard  Food Insecurity: No Food Insecurity (09/23/2022)   Hunger Vital Sign    Worried About Running Out of Food in the Last Year: Never true    Ran Out of Food in the Last Year: Never true  Transportation Needs: No Transportation Needs (09/23/2022)   PRAPARE - Administrator, Civil Service (Medical): No    Lack of Transportation (Non-Medical): No  Physical Activity: Sufficiently Active (09/13/2020)   Exercise Vital Sign    Days of Exercise per Week: 7 days    Minutes of Exercise per Session: 30 min  Stress: No Stress Concern Present (09/13/2020)   [employer] of Occupational Health - Occupational Stress Questionnaire    Feeling of  Stress : Only a little  Social Connections: Moderately Isolated (09/13/2020)   Social Connection and Isolation Panel [NHANES]    Frequency of Communication with Friends and Family: More than three times a week    Frequency of Social Gatherings with Friends and Family: Once a week    Attends Religious Services: More than 4 times per year    Active Member of [employer] or Organizations: No    Attends Banker Meetings: Never    Marital Status: Never married  Intimate Partner Violence: Unknown (09/23/2022)   Humiliation, Afraid, Rape, and Kick questionnaire    Fear of Current or Ex-Partner: No    Emotionally Abused: No    Physically Abused: No    Sexually Abused: Patient declined     Review of Systems   Gen: Denies any fever, chills, fatigue, weight loss, lack of appetite.  CV: Denies chest pain, heart palpitations, peripheral edema, syncope.  Resp: Denies shortness of breath at rest or with exertion. Denies wheezing or cough.  GI: Denies dysphagia or odynophagia. Denies jaundice, hematemesis, fecal incontinence. GU : Denies urinary burning, urinary frequency, urinary hesitancy MS: Denies joint pain, muscle weakness, cramps, or limitation of movement.  Derm: Denies rash, itching, dry skin Psych: Denies depression, anxiety, memory loss, and confusion Heme: Denies bruising, bleeding, and enlarged lymph nodes.   Physical Exam   BP 123/77   Pulse 71   Temp 98.1 F (36.7 C)   Ht 6' (1.829 m)   Wt 178 lb 9.6 oz (81 kg)   BMI 24.22 kg/m  General:   Alert and oriented. Pleasant and cooperative. Well-nourished and well-developed.  Head:  Normocephalic and atraumatic. Eyes:  Without icterus Abdomen:  +BS, soft, non-tender and non-distended. No HSM noted. No guarding or rebound. No masses appreciated.  Rectal:  Deferred  Msk:  Symmetrical without gross deformities. Normal posture. Extremities:  Without edema. Neurologic:  Alert and  oriented x4;  grossly normal  neurologically. Skin:  Intact without significant lesions or rashes. Psych:  Alert and cooperative. Normal mood and affect.   Assessment   [name] [name] is a 57 y.o. male presenting today in follow-up with a history of   cirrhosis due to ETOH,  pancreatitis due to alcohol and gallstones, gallbladder polyp, returning for follow-up.  Cirrhosis: diuretic management changed as recently inpatient May 2024 with acute on chronic renal failure. Currently with Lasix 40 mg in am and 20 mg in afternoon, then spironolactone 100 mg in am and 50 mg in pm. He is established with [facility] and saw last week. Appears "new normal" creatinine level for him is in the low 2 range. EGD will be due in May 2025. [place] due in Nov 2024 along with AFP. No ETOH for the past 4-5 months per patient.   Concern for gallbladder polyp/cholelithiasis: not a good surgical candidate in light of cirrhosis.      PLAN    Continue to follow with Nephrology Continue Lasix 40 mg in morning and 20 mg in afternoon. Spironolactone 100 mg in am and 50 mg in afternoon Continue to avoid alcohol [place] due in Nov 2024 with AFP Keep follow-up with [name] EGD in May 2025 Colonoscopy in 2026 Close follow-up in August Will check iron studies in August: Hematology aware of prior concerns for iron overload Low salt diet   Gelene Mink, PhD, ANP-BC [facility]

## 2022-10-11 ENCOUNTER — Telehealth: Payer: Medicaid Other | Admitting: Physician Assistant

## 2022-10-19 ENCOUNTER — Ambulatory Visit (INDEPENDENT_AMBULATORY_CARE_PROVIDER_SITE_OTHER): Payer: Medicaid Other | Admitting: Podiatry

## 2022-10-19 ENCOUNTER — Encounter: Payer: Self-pay | Admitting: Podiatry

## 2022-10-19 DIAGNOSIS — M21622 Bunionette of left foot: Secondary | ICD-10-CM | POA: Diagnosis not present

## 2022-10-19 DIAGNOSIS — M2041 Other hammer toe(s) (acquired), right foot: Secondary | ICD-10-CM

## 2022-10-19 DIAGNOSIS — M21621 Bunionette of right foot: Secondary | ICD-10-CM

## 2022-10-20 NOTE — Progress Notes (Signed)
Subjective:   Patient ID: [name] [name], male   DOB: 57 y.o.   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  We Argun to hold off currently but will be necessary someday debridement accomplished today Lyda Jester

## 2022-10-23 ENCOUNTER — Inpatient Hospital Stay: Payer: Medicaid Other | Attending: Physician Assistant

## 2022-10-23 VITALS — BP 143/74 | HR 63 | Resp 18

## 2022-10-23 DIAGNOSIS — E538 Deficiency of other specified B group vitamins: Secondary | ICD-10-CM | POA: Insufficient documentation

## 2022-10-23 MED ORDER — CYANOCOBALAMIN 1000 MCG/ML IJ SOLN
1000.0000 ug | Freq: Once | INTRAMUSCULAR | Status: AC
  Administered 2022-10-23: 1000 ug via INTRAMUSCULAR
  Filled 2022-10-23: qty 1

## 2022-10-23 NOTE — Progress Notes (Signed)
Patient tolerated injection with no complaints voiced. Site clean and dry with no bruising or swelling noted at site. See MAR for details. Band aid applied.  Patient stable during and after injection. VSS with discharge and left in satisfactory condition with no s/s of distress noted.  

## 2022-10-23 NOTE — Patient Instructions (Signed)
[facility] [place]  Discharge Instructions: Thank you for choosing [facility] to provide your oncology and hematology care.  If you have a lab appointment with the Cancer Center - please note that after April 8th, 2024, all labs will be drawn in the cancer center.  You do not have to check in or register with the main entrance as you have in the past but will complete your check-in in the cancer center.  Wear comfortable clothing and clothing appropriate for easy access to any Portacath or PICC line.   We strive to give you quality time with your provider. You may need to reschedule your appointment if you arrive late (15 or more minutes).  Arriving late affects you and other patients whose appointments are after yours.  Also, if you miss three or more appointments without notifying the office, you may be dismissed from the clinic at the provider's discretion.      For prescription refill requests, have your pharmacy contact our office and allow 72 hours for refills to be completed.    Today you received the following B12 injection, return as scheduled.   To help prevent nausea and vomiting after your treatment, we encourage you to take your nausea medication as directed.  BELOW ARE SYMPTOMS THAT SHOULD BE REPORTED IMMEDIATELY: *FEVER GREATER THAN 100.4 F (38 C) OR HIGHER *CHILLS OR SWEATING *NAUSEA AND VOMITING THAT IS NOT CONTROLLED WITH YOUR NAUSEA MEDICATION *UNUSUAL SHORTNESS OF BREATH *UNUSUAL BRUISING OR BLEEDING *URINARY PROBLEMS (pain or burning when urinating, or frequent urination) *BOWEL PROBLEMS (unusual diarrhea, constipation, pain near the anus) TENDERNESS IN MOUTH AND THROAT WITH OR WITHOUT PRESENCE OF ULCERS (sore throat, sores in mouth, or a toothache) UNUSUAL RASH, SWELLING OR PAIN  UNUSUAL VAGINAL DISCHARGE OR ITCHING   Items with * indicate a potential emergency and should be followed up as soon as possible or go to the Emergency  Department if any problems should occur.  Please show the CHEMOTHERAPY ALERT CARD or IMMUNOTHERAPY ALERT CARD at check-in to the Emergency Department and triage nurse.  Should you have questions after your visit or need to cancel or reschedule your appointment, please contact [facility] [place] [phone]  and follow the prompts.  Office hours are 8:00 a.m. to 4:30 p.m. Monday - Friday. Please note that voicemails left after 4:00 p.m. may not be returned until the following business day.  We are closed weekends and major holidays. You have access to a nurse at all times for urgent questions. Please call the main number to the clinic [phone] and follow the prompts.  For any non-urgent questions, you may also contact your provider using MyChart. We now offer e-Visits for anyone 18 and older to request care online for non-urgent symptoms. For details visit [email or web address].   Also download the MyChart app! Go to the app store, search "MyChart", open the app, select , and log in with your MyChart username and password.   

## 2022-11-22 ENCOUNTER — Inpatient Hospital Stay: Payer: Medicaid Other | Attending: Physician Assistant

## 2022-11-22 VITALS — BP 132/73 | HR 65 | Temp 97.4°F | Resp 18 | Wt 171.6 lb

## 2022-11-22 DIAGNOSIS — E538 Deficiency of other specified B group vitamins: Secondary | ICD-10-CM | POA: Insufficient documentation

## 2022-11-22 MED ORDER — CYANOCOBALAMIN 1000 MCG/ML IJ SOLN
1000.0000 ug | Freq: Once | INTRAMUSCULAR | Status: AC
  Administered 2022-11-22: 1000 ug via INTRAMUSCULAR
  Filled 2022-11-22: qty 1

## 2022-11-22 NOTE — Patient Instructions (Signed)
[facility] [place]  Discharge Instructions: Thank you for choosing [facility] to provide your oncology and hematology care.  If you have a lab appointment with the Cancer Center - please note that after April 8th, 2024, all labs will be drawn in the cancer center.  You do not have to check in or register with the main entrance as you have in the past but will complete your check-in in the cancer center.  Wear comfortable clothing and clothing appropriate for easy access to any Portacath or PICC line.   We strive to give you quality time with your provider. You may need to reschedule your appointment if you arrive late (15 or more minutes).  Arriving late affects you and other patients whose appointments are after yours.  Also, if you miss three or more appointments without notifying the office, you may be dismissed from the clinic at the provider's discretion.      For prescription refill requests, have your pharmacy contact our office and allow 72 hours for refills to be completed.    Today you received the following chemotherapy and/or immunotherapy agents B12      To help prevent nausea and vomiting after your treatment, we encourage you to take your nausea medication as directed.  BELOW ARE SYMPTOMS THAT SHOULD BE REPORTED IMMEDIATELY: *FEVER GREATER THAN 100.4 F (38 C) OR HIGHER *CHILLS OR SWEATING *NAUSEA AND VOMITING THAT IS NOT CONTROLLED WITH YOUR NAUSEA MEDICATION *UNUSUAL SHORTNESS OF BREATH *UNUSUAL BRUISING OR BLEEDING *URINARY PROBLEMS (pain or burning when urinating, or frequent urination) *BOWEL PROBLEMS (unusual diarrhea, constipation, pain near the anus) TENDERNESS IN MOUTH AND THROAT WITH OR WITHOUT PRESENCE OF ULCERS (sore throat, sores in mouth, or a toothache) UNUSUAL RASH, SWELLING OR PAIN  UNUSUAL VAGINAL DISCHARGE OR ITCHING   Items with * indicate a potential emergency and should be followed up as soon as possible or go to the  Emergency Department if any problems should occur.  Please show the CHEMOTHERAPY ALERT CARD or IMMUNOTHERAPY ALERT CARD at check-in to the Emergency Department and triage nurse.  Should you have questions after your visit or need to cancel or reschedule your appointment, please contact [facility] [place] [phone]  and follow the prompts.  Office hours are 8:00 a.m. to 4:30 p.m. Monday - Friday. Please note that voicemails left after 4:00 p.m. may not be returned until the following business day.  We are closed weekends and major holidays. You have access to a nurse at all times for urgent questions. Please call the main number to the clinic [phone] and follow the prompts.  For any non-urgent questions, you may also contact your provider using MyChart. We now offer e-Visits for anyone 18 and older to request care online for non-urgent symptoms. For details visit [email or web address].   Also download the MyChart app! Go to the app store, search "MyChart", open the app, select [facility], and log in with your MyChart username and password.   

## 2022-11-22 NOTE — Progress Notes (Signed)
[name] [name] presents today for B12 injection per the provider's orders.  Stable during administration without incident; injection site WNL; see MAR for injection details.  Patient tolerated procedure well and without incident.  No questions or complaints noted at this time.

## 2022-12-02 ENCOUNTER — Emergency Department (HOSPITAL_COMMUNITY)
Admission: EM | Admit: 2022-12-02 | Discharge: 2022-12-03 | Disposition: A | Discharge status: Home / Self Care | Payer: Medicaid Other | Attending: Emergency Medicine | Admitting: Emergency Medicine

## 2022-12-02 ENCOUNTER — Other Ambulatory Visit: Payer: Self-pay

## 2022-12-02 ENCOUNTER — Encounter (HOSPITAL_COMMUNITY): Payer: Self-pay

## 2022-12-02 DIAGNOSIS — Z7951 Long term (current) use of inhaled steroids: Secondary | ICD-10-CM | POA: Insufficient documentation

## 2022-12-02 DIAGNOSIS — I129 Hypertensive chronic kidney disease with stage 1 through stage 4 chronic kidney disease, or unspecified chronic kidney disease: Secondary | ICD-10-CM | POA: Insufficient documentation

## 2022-12-02 DIAGNOSIS — J449 Chronic obstructive pulmonary disease, unspecified: Secondary | ICD-10-CM | POA: Insufficient documentation

## 2022-12-02 DIAGNOSIS — E538 Deficiency of other specified B group vitamins: Secondary | ICD-10-CM | POA: Diagnosis not present

## 2022-12-02 DIAGNOSIS — R140 Abdominal distension (gaseous): Secondary | ICD-10-CM | POA: Diagnosis not present

## 2022-12-02 DIAGNOSIS — N189 Chronic kidney disease, unspecified: Secondary | ICD-10-CM | POA: Diagnosis not present

## 2022-12-02 DIAGNOSIS — Z79899 Other long term (current) drug therapy: Secondary | ICD-10-CM | POA: Diagnosis not present

## 2022-12-02 DIAGNOSIS — Z7982 Long term (current) use of aspirin: Secondary | ICD-10-CM | POA: Diagnosis not present

## 2022-12-02 LAB — CBC WITH DIFFERENTIAL/PLATELET
Abs Immature Granulocytes: 0.01 10*3/uL (ref 0.00–0.07)
Basophils Absolute: 0.1 10*3/uL (ref 0.0–0.1)
Basophils Relative: 1 %
Eosinophils Absolute: 0.3 10*3/uL (ref 0.0–0.5)
Eosinophils Relative: 3 %
HCT: 34.1 % — ABNORMAL LOW (ref 39.0–52.0)
Hemoglobin: 11 g/dL — ABNORMAL LOW (ref 13.0–17.0)
Immature Granulocytes: 0 %
Lymphocytes Relative: 28 %
Lymphs Abs: 2.3 10*3/uL (ref 0.7–4.0)
MCH: 28.2 pg (ref 26.0–34.0)
MCHC: 32.3 g/dL (ref 30.0–36.0)
MCV: 87.4 fL (ref 80.0–100.0)
Monocytes Absolute: 1 10*3/uL (ref 0.1–1.0)
Monocytes Relative: 13 %
Neutro Abs: 4.4 10*3/uL (ref 1.7–7.7)
Neutrophils Relative %: 55 %
Platelets: 313 10*3/uL (ref 150–400)
RBC: 3.9 MIL/uL — ABNORMAL LOW (ref 4.22–5.81)
RDW: 14.9 % (ref 11.5–15.5)
WBC: 8.1 10*3/uL (ref 4.0–10.5)
nRBC: 0 % (ref 0.0–0.2)

## 2022-12-02 LAB — COMPREHENSIVE METABOLIC PANEL
ALT: 15 U/L (ref 0–44)
AST: 23 U/L (ref 15–41)
Albumin: 2.9 g/dL — ABNORMAL LOW (ref 3.5–5.0)
Alkaline Phosphatase: 84 U/L (ref 38–126)
Anion gap: 8 (ref 5–15)
BUN: 38 mg/dL — ABNORMAL HIGH (ref 6–20)
CO2: 19 mmol/L — ABNORMAL LOW (ref 22–32)
Calcium: 7.8 mg/dL — ABNORMAL LOW (ref 8.9–10.3)
Chloride: 102 mmol/L (ref 98–111)
Creatinine, Ser: 2.52 mg/dL — ABNORMAL HIGH (ref 0.61–1.24)
GFR, Estimated: 29 mL/min — ABNORMAL LOW (ref >60)
Glucose, Bld: 96 mg/dL (ref 70–99)
Potassium: 3.9 mmol/L (ref 3.5–5.1)
Sodium: 129 mmol/L — ABNORMAL LOW (ref 135–145)
Total Bilirubin: 0.4 mg/dL (ref 0.3–1.2)
Total Protein: 7.7 g/dL (ref 6.5–8.1)

## 2022-12-02 MED ORDER — SODIUM CHLORIDE 0.9 % IV BOLUS
500.0000 mL | Freq: Once | INTRAVENOUS | Status: AC
  Administered 2022-12-02: 500 mL via INTRAVENOUS

## 2022-12-02 NOTE — Discharge Instructions (Signed)
Your marker of kidney function is at a similar level as it was at your time of discharge from the hospital 2 months ago.  Your sodium was slightly lower than where it normally is.  You did receive IV fluids in the emergency department.  Continue to stay hydrated at home.  Continue to follow-up with your outpatient providers for repeat lab work.  Return to the emergency department for any new or worsening symptoms of concern.

## 2022-12-02 NOTE — ED Provider Notes (Signed)
[place] EMERGENCY DEPARTMENT AT [facility] Provider Note   CSN: [id number] Arrival date & time: 12/02/22  2143     History {Add pertinent medical, surgical, social history, OB history to HPI:1} Chief Complaint  Patient presents with   abnormal labs    [name] [name] is a 57 y.o. male.  HPI Patient presents for abnormal lab result.  Medical history includes cirrhosis, DVT, COPD, anemia, GERD, asthma, nephrolithiasis, HTN.  He underwent outpatient lab work and was told by his doctor that his creatinine was elevated.  Patient denies any current or recent symptoms.    Home Medications Prior to Admission medications   Medication Sig Start Date End Date Taking? Authorizing Provider  albuterol (PROVENTIL HFA) 108 (90 Base) MCG/ACT inhaler INHALE 2 PUFFS BY MOUTH EVERY 6 HOURS AS NEEDED FOR COUGHING, WHEEZING, OR SHORTNESS OF BREATH Patient taking differently: Inhale 2 puffs into the lungs every 6 (six) hours as needed for shortness of breath or wheezing. 02/10/21   Shon Hale, MD  aspirin EC 81 MG tablet Take 81 mg by mouth daily.    [provider]  cyanocobalamin (VITAMIN B12) 1000 MCG/ML injection Inject 1,000 mcg into the muscle every 30 (thirty) days.    [provider]  furosemide (LASIX) 20 MG tablet Take 1 tablet (20 mg total) by mouth daily. TAKE 2 TABLETS BY MOUTH IN THE MORNING AND 1 TABLET IN THE AFTERNOON 09/27/22   Vassie Loll, MD  metoprolol tartrate (LOPRESSOR) 25 MG tablet Take 25 mg by mouth See admin instructions. 25 mg twice daily in the morning and afternoon. 05/24/21   [provider]  mometasone-formoterol (DULERA) 200-5 MCG/ACT AERO Inhale 2 puffs into the lungs 2 (two) times daily. 02/10/21   Shon Hale, MD  pantoprazole (PROTONIX) 40 MG tablet TAKE 1 TABLET(40 MG) BY MOUTH DAILY Patient taking differently: Take 40 mg by mouth daily. 09/18/22   Gelene Mink, NP  pyridOXINE (VITAMIN B6) 25 MG tablet Take 4 tablets  (100 mg total) by mouth daily for 21 days, THEN 1 tablet (25 mg total) daily. 10/06/22 04/25/23  Carnella Guadalajara, PA-C  sodium bicarbonate 650 MG tablet Take 1 tablet (650 mg total) by mouth 3 (three) times daily. 09/25/22   Vassie Loll, MD  spironolactone (ALDACTONE) 50 MG tablet Take 1 tablet (50 mg total) by mouth daily. Take 100 mg by mouth in morning and 50 mg in the afternoon 09/26/22   Vassie Loll, MD  tamsulosin (FLOMAX) 0.4 MG CAPS capsule Take 1 capsule (0.4 mg total) by mouth daily after supper. 09/25/22   Vassie Loll, MD      Allergies    Patient has no known allergies.    Review of Systems   Review of Systems  Constitutional:  Negative for activity change, appetite change and fatigue.  Respiratory:  Negative for shortness of breath.   Cardiovascular:  Negative for chest pain.  Gastrointestinal:  Negative for abdominal pain.  All other systems reviewed and are negative.   Physical Exam Updated Vital Signs BP 118/80 (BP Location: Right Arm)   Pulse 78   Temp 98.7 F (37.1 C) (Oral)   Resp 20   Ht 6' (1.829 m)   Wt 77.9 kg   SpO2 98%   BMI 23.28 kg/m  Physical Exam Vitals and nursing note reviewed.  Constitutional:      General: He is not in acute distress.    Appearance: Normal appearance. He is well-developed. He is not ill-appearing, toxic-appearing  or diaphoretic.  HENT:     Head: Normocephalic and atraumatic.     Right Ear: External ear normal.     Left Ear: External ear normal.     Nose: Nose normal.     Mouth/Throat:     Mouth: Mucous membranes are moist.  Eyes:     Extraocular Movements: Extraocular movements intact.     Conjunctiva/sclera: Conjunctivae normal.  Cardiovascular:     Rate and Rhythm: Normal rate and regular rhythm.     Heart sounds: No murmur heard. Pulmonary:     Effort: Pulmonary effort is normal. No respiratory distress.     Breath sounds: No wheezing or rales.  Chest:     Chest wall: No tenderness.  Abdominal:      General: There is distension.     Palpations: Abdomen is soft.     Tenderness: There is no abdominal tenderness.  Musculoskeletal:        General: Normal range of motion.     Cervical back: Neck supple.  Skin:    General: Skin is warm and dry.     Coloration: Skin is not jaundiced or pale.  Neurological:     General: No focal deficit present.     Mental Status: He is alert and oriented to person, place, and time.  Psychiatric:        Mood and Affect: Mood normal.        Behavior: Behavior normal.        Thought Content: Thought content normal.        Judgment: Judgment normal.     ED Results / Procedures / Treatments   Labs (all labs ordered are listed, but only abnormal results are displayed) Labs Reviewed  CBC WITH DIFFERENTIAL/PLATELET - Abnormal; Notable for the following components:      Result Value   RBC 3.90 (*)    Hemoglobin 11.0 (*)    HCT 34.1 (*)    All other components within normal limits  COMPREHENSIVE METABOLIC PANEL    EKG None  Radiology No results found.  Procedures Procedures  {Document cardiac monitor, telemetry assessment procedure when appropriate:1}  Medications Ordered in ED Medications - No data to display  ED Course/ Medical Decision Making/ A&P   {   Click here for ABCD2, HEART and other calculatorsREFRESH Note before signing :1}                              Medical Decision Making  Patient presents for concern of abnormal lab work.  2 months ago, he was hospitalized for an AKI.  At time of admission, his creatinine was 3.56.  After 2-day hospital stay, he was discharged with creatinine is 2.22.  Per chart review, his outpatient lab work yesterday showed a creatinine of 3.04.  Lab work was obtained today.  Lab work today shows a creatinine of 2.52.  This is in line with his lab work at time of hospital discharge 2 months ago.  His BUN and acidosis appears to be baseline.  His sodium is slightly less than baseline.  Patient was given  bolus of IV fluid.  Given his current creatinine, which does appear to be in line with his baseline, I do not feel the patient requires hospitalization.  Patient is comfortable with this plan.  Patient to continue to follow-up outpatient for ongoing lab work.  He was discharged in stable condition.  {Document critical care time when  appropriate:1} {Document review of labs and clinical decision tools ie heart score, Chads2Vasc2 etc:1}  {Document your independent review of radiology images, and any outside records:1} {Document your discussion with family members, caretakers, and with consultants:1} {Document social determinants of health affecting pt's care:1} {Document your decision making why or why not admission, treatments were needed:1} Final Clinical Impression(s) / ED Diagnoses Final diagnoses:  None    Rx / DC Orders ED Discharge Orders     None

## 2022-12-02 NOTE — ED Triage Notes (Signed)
Pt arrived via POV from home reporting he received a phone call from his doctors office advising him to seek medical treatment at the ER due to having an elevated Creatine Level. Pt is also in the transplant list for a Liver.

## 2022-12-07 ENCOUNTER — Telehealth: Payer: Self-pay

## 2022-12-07 NOTE — Telephone Encounter (Signed)
Pt phoned and stated he has an appt with you on Tuesday 13th and wonders if there is blood work he needs to do? Please advise

## 2022-12-07 NOTE — Telephone Encounter (Signed)
Phoned and advised the pt of note. Pt expressed understanding.

## 2022-12-07 NOTE — Telephone Encounter (Signed)
He has had recent labs, so we don't need additional.

## 2022-12-11 ENCOUNTER — Telehealth: Payer: Self-pay

## 2022-12-11 NOTE — Telephone Encounter (Signed)
Pt's ov note from [facility] is on your desk in the yellow folder

## 2022-12-12 ENCOUNTER — Ambulatory Visit: Payer: Medicaid Other | Admitting: Gastroenterology

## 2022-12-12 ENCOUNTER — Encounter: Payer: Self-pay | Admitting: Gastroenterology

## 2022-12-12 VITALS — BP 136/80 | HR 65 | Temp 98.6°F | Ht 72.0 in | Wt 174.8 lb

## 2022-12-12 DIAGNOSIS — K7030 Alcoholic cirrhosis of liver without ascites: Secondary | ICD-10-CM

## 2022-12-12 NOTE — Patient Instructions (Signed)
Continue the Lasix and Aldactone as you are doing.  Avoid alcohol completely!  We will see you in November and arrange an ultrasound at that time.  I enjoyed seeing you again today! I value our relationship and want to provide genuine, compassionate, and quality care. You may receive a survey regarding your visit with me, and I welcome your feedback! Thanks so much for taking the time to complete this. I look forward to seeing you again.      Gelene Mink, PhD, ANP-BC [facility] Gastroenter[facility]

## 2022-12-12 NOTE — Progress Notes (Signed)
Gastroenterology Office Note     Primary Care Physician:  Benetta Spar, MD  Primary Gastroenterologist: Dr. Jena Gauss    Chief Complaint   Chief Complaint  Patient presents with   Follow-up    Follow up cirrhosis     History of Present Illness   [name] [name] is a 57 y.o. male presenting today with a history of  cirrhosis due to ETOH, followed by Liver clinic in [place] as well [name], FNP). EGD May 2023 with normal esophagus but retained food in stomach and duodenum precluded exam, pancreatitis due to alcohol and gallstones Jan 2024, history of gallbladder polyp,   Repeat MRI pancreas with resolution of pancreatitis.  Hepatic and splenic iron deposition noted. He has chronically elevated ferritin and iron sats previously felt to be due to liver disease and alcohol use. 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 Hematology is aware and following as well with plans for additional imaging if persistently elevated indices. Consider chelating agent if moderate to severe iron deposition. Most recent iron studies with completely normal ferritin (210) and iron sats 24. Hematology holding off on repeating MRI or chelating agents for time being. Will need to update labs later in year.   Beer maybe every other day. Trying to follow a low salt diet. In ED earlier this month with abnormal creatinine at 3.04, which is increased from prior baseline. No abdominal pain, nausea, vomiting, mental status changes, confusion, overt GI bleeding.   Cirrhosis related questions: Episodes of confusion/disorientation: No Taking diuretics? Lasix 40 mg in am and 20 mg in pm, aldactone 100 mg in morning and 50 mg in pm  Beta blockers? No Prior history of variceal banding? no Prior episodes of SBP? no Last liver imaging: May 2024  Alcohol use: YES. PEth markedly positive in Aug 2024.    EGD May 2023: normal esophagus but retained food in stomach and  duodenum precluded exam. Surveillance 2025   Colonoscopy Dec 2023: pancolonic diverticulosis, multiple polyps, one which was 11 mm (tubular adenomas). Surveillance in 3 years.      Past Medical History:  Diagnosis Date   Asthma    B12 deficiency 03/15/2022   Cirrhosis (HCC)    Dyspnea    High cholesterol    History of kidney stones    Hypertension    Kidney stones     Past Surgical History:  Procedure Laterality Date   APPENDECTOMY     BIOPSY  07/22/2020   Procedure: BIOPSY;  Surgeon: Corbin Ade, MD;  Location: [facility];  Service: Endoscopy;;   COLONOSCOPY WITH PROPOFOL N/A 04/18/2017   non-bleeding internal hemorrhoids, two 4-6 mm polyps in descending colon and cecum, pancolonic diverticulosis, single cecal AVM. Tubular adenomas, surveillance in 2023.    COLONOSCOPY WITH PROPOFOL N/A 04/13/2022   pancolonic diverticulosis, multiple polyps, one which was 11 mm (tubular adenomas). Surveillance in 3 years.   ESOPHAGOGASTRODUODENOSCOPY (EGD) WITH PROPOFOL N/A 07/22/2020   normal esophagus, small hiatal hernia, abnormal gastric mucosa, abnormal appearing ampula and periampullary mucosa. Mild chronic gastritis.   ESOPHAGOGASTRODUODENOSCOPY (EGD) WITH PROPOFOL N/A 09/08/2021   normal esophagus, retained food in stomach and duodenum precluded complete examination.   FRACTURE SURGERY     left arm   IR PARACENTESIS  05/31/2021   LOWER EXTREMITY VENOGRAPHY N/A 08/10/2020   Procedure: LOWER EXTREMITY VENOGRAPHY;  Surgeon: Maeola Harman, MD;  Location: [facility] INVASIVE CV LAB;  Service: Cardiovascular;  Laterality: N/A;  PERIPHERAL VASCULAR BALLOON ANGIOPLASTY Left 08/10/2020   Procedure: PERIPHERAL VASCULAR BALLOON ANGIOPLASTY;  Surgeon: Maeola Harman, MD;  Location: [facility] INVASIVE CV LAB;  Service: Cardiovascular;  Laterality: Left;  lower extremity venous   PERIPHERAL VASCULAR THROMBECTOMY N/A 08/10/2020   Procedure: PERIPHERAL VASCULAR THROMBECTOMY;   Surgeon: Maeola Harman, MD;  Location: [facility] INVASIVE CV LAB;  Service: Cardiovascular;  Laterality: N/A;   POLYPECTOMY  04/18/2017   Procedure: POLYPECTOMY;  Surgeon: Corbin Ade, MD;  Location: [facility];  Service: Endoscopy;;   POLYPECTOMY  04/13/2022   Procedure: POLYPECTOMY;  Surgeon: Corbin Ade, MD;  Location: [facility];  Service: Endoscopy;;    Current Outpatient Medications  Medication Sig Dispense Refill   albuterol (PROVENTIL HFA) 108 (90 Base) MCG/ACT inhaler INHALE 2 PUFFS BY MOUTH EVERY 6 HOURS AS NEEDED FOR COUGHING, WHEEZING, OR SHORTNESS OF BREATH (Patient taking differently: Inhale 2 puffs into the lungs every 6 (six) hours as needed for shortness of breath or wheezing.) 18 g 1   aspirin EC 81 MG tablet Take 81 mg by mouth daily.     cyanocobalamin (VITAMIN B12) 1000 MCG/ML injection Inject 1,000 mcg into the muscle every 30 (thirty) days.     furosemide (LASIX) 20 MG tablet Take 1 tablet (20 mg total) by mouth daily. TAKE 2 TABLETS BY MOUTH IN THE MORNING AND 1 TABLET IN THE AFTERNOON     metoprolol tartrate (LOPRESSOR) 25 MG tablet Take 25 mg by mouth See admin instructions. 25 mg twice daily in the morning and afternoon.     mometasone-formoterol (DULERA) 200-5 MCG/ACT AERO Inhale 2 puffs into the lungs 2 (two) times daily. 13 g 3   pantoprazole (PROTONIX) 40 MG tablet TAKE 1 TABLET(40 MG) BY MOUTH DAILY (Patient taking differently: Take 40 mg by mouth daily.) 90 tablet 3   pyridOXINE (VITAMIN B6) 25 MG tablet Take 4 tablets (100 mg total) by mouth daily for 21 days, THEN 1 tablet (25 mg total) daily. 264 tablet 0   sodium bicarbonate 650 MG tablet Take 1 tablet (650 mg total) by mouth 3 (three) times daily. 90 tablet 1   spironolactone (ALDACTONE) 50 MG tablet Take 1 tablet (50 mg total) by mouth daily. Take 100 mg by mouth in morning and 50 mg in the afternoon     tamsulosin (FLOMAX) 0.4 MG CAPS capsule Take 1 capsule (0.4 mg total) by mouth daily  after supper. 30 capsule 2   No current facility-administered medications for this visit.    Allergies as of 12/12/2022   (No Known Allergies)    Family History  Problem Relation Age of Onset   Cancer Father        throat   Diabetes Sister    Colon cancer Neg Hx     Social History   Socioeconomic History   Marital status: Single    Spouse name: Not on file   Number of children: Not on file   Years of education: Not on file   Highest education level: Not on file  Occupational History   Not on file  Tobacco Use   Smoking status: Every Day    Current packs/day: 0.50    Average packs/day: 0.5 packs/day for 33.0 years (16.5 ttl pk-yrs)    Types: Cigarettes    Passive exposure: Current   Smokeless tobacco: Never  Vaping Use   Vaping status: Never Used  Substance and Sexual Activity   Alcohol use: Not Currently    Comment: last  deink 1 week ago   Drug use: No   Sexual activity: Yes    Birth control/protection: None  Other Topics Concern   Not on file  Social History Narrative   Not on file   Social Determinants of Health   Financial Resource Strain: High Risk (11/28/2021)   [facility]   Overall Financial Resource Strain (CARDIA)    Difficulty of Paying Living Expenses: Very hard  Food Insecurity: No Food Insecurity (09/23/2022)   Hunger Vital Sign    Worried About Running Out of Food in the Last Year: Never true    Ran Out of Food in the Last Year: Never true  Transportation Needs: No Transportation Needs (09/23/2022)   PRAPARE - Administrator, Civil Service (Medical): No    Lack of Transportation (Non-Medical): No  Physical Activity: Sufficiently Active (09/13/2020)   Exercise Vital Sign    Days of Exercise per Week: 7 days    Minutes of Exercise per Session: 30 min  Stress: No Stress Concern Present (09/13/2020)   [employer] of Occupational Health - Occupational Stress Questionnaire    Feeling of Stress : Only  a little  Social Connections: Moderately Isolated (09/13/2020)   Social Connection and Isolation Panel [NHANES]    Frequency of Communication with Friends and Family: More than three times a week    Frequency of Social Gatherings with Friends and Family: Once a week    Attends Religious Services: More than 4 times per year    Active Member of [employer] or Organizations: No    Attends Banker Meetings: Never    Marital Status: Never married  Intimate Partner Violence: Unknown (09/23/2022)   Humiliation, Afraid, Rape, and Kick questionnaire    Fear of Current or Ex-Partner: No    Emotionally Abused: No    Physically Abused: No    Sexually Abused: Patient declined     Review of Systems   Gen: Denies any fever, chills, fatigue, weight loss, lack of appetite.  CV: Denies chest pain, heart palpitations, peripheral edema, syncope.  Resp: Denies shortness of breath at rest or with exertion. Denies wheezing or cough.  GI: Denies dysphagia or odynophagia. Denies jaundice, hematemesis, fecal incontinence. GU : Denies urinary burning, urinary frequency, urinary hesitancy MS: Denies joint pain, muscle weakness, cramps, or limitation of movement.  Derm: Denies rash, itching, dry skin Psych: Denies depression, anxiety, memory loss, and confusion Heme: Denies bruising, bleeding, and enlarged lymph nodes.   Physical Exam   BP 136/80   Pulse 65   Temp 98.6 F (37 C)   Ht 6' (1.829 m)   Wt 174 lb 12.8 oz (79.3 kg)   BMI 23.71 kg/m  General:   Alert and oriented. Pleasant and cooperative. Well-nourished and well-developed.  Head:  Normocephalic and atraumatic. Eyes:  Without icterus Abdomen:  +BS, soft, non-tender and non-distended. No HSM noted. No guarding or rebound. No masses appreciated.  Rectal:  Deferred  Msk:  Symmetrical without gross deformities. Normal posture. Extremities:  Without edema. Neurologic:  Alert and  oriented x4;  grossly normal neurologically. Skin:   Intact without significant lesions or rashes. Psych:  Alert and cooperative. Normal mood and affect.   Assessment   [name] [name] is a 57 y.o. male presenting today with a history of decompensated cirrhosis due to ETOH, chronically elevaged ferritin and iron sats previously felt secondary to alcohol, CKD followed by Nephrology.  Cirrhosis: followed by Liver Clinic in [place]  as well. Unfortunately, recent PEth markedly positive. He does admit to drinking beer every other day and is well aware of the risks of morbidity and mortality. [place] due in Nov 2024 and will do labs at that time as well. Continue current diuretic regimen. Will need EGD for updated screening in 2025.   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 Hematology is aware and following as well with plans for additional imaging if persistently elevated indices. Consider chelating agent if moderate to severe iron deposition. Most recent iron studies with completely normal ferritin (210) and iron sats 24. Hematology holding off on repeating MRI or chelating agents for time being. Will need to update labs later in year.      PLAN    Continue current diuretic regimen Continue close follow-up with Nephrology Absolutely avoid ETOH [place] in November, MELD labs, iron studies, AFP in November EGD May 2025 Colonoscopy 2025 Return in November   Gelene Mink, PhD, [facility] Gastroenterology

## 2022-12-22 ENCOUNTER — Inpatient Hospital Stay: Payer: Medicaid Other | Attending: Physician Assistant

## 2022-12-22 VITALS — BP 127/67 | HR 61 | Temp 97.6°F | Resp 18 | Ht 72.0 in | Wt 174.2 lb

## 2022-12-22 DIAGNOSIS — E538 Deficiency of other specified B group vitamins: Secondary | ICD-10-CM

## 2022-12-22 MED ORDER — CYANOCOBALAMIN 1000 MCG/ML IJ SOLN
1000.0000 ug | Freq: Once | INTRAMUSCULAR | Status: AC
  Administered 2022-12-22: 1000 ug via INTRAMUSCULAR
  Filled 2022-12-22: qty 1

## 2022-12-22 NOTE — Progress Notes (Signed)
Patient arrived for B12 injection with no complaints noted.  Injection given in r deltoid without incident.  Tolerated well.  Site CDI and band aid applied.  Discharged ambulatory in stable condition.

## 2022-12-27 ENCOUNTER — Other Ambulatory Visit (INDEPENDENT_AMBULATORY_CARE_PROVIDER_SITE_OTHER): Payer: Self-pay | Admitting: Gastroenterology

## 2022-12-27 NOTE — Telephone Encounter (Signed)
Last OV with you on 12/12/22 for cirrhosis and note states to Diuretics: Lasix 40 mg in am and 20 mg in pm     Aldactone 100 mg in morning and 50 in pm  Next appt is 03/13/23 with you.

## 2022-12-28 ENCOUNTER — Ambulatory Visit (INDEPENDENT_AMBULATORY_CARE_PROVIDER_SITE_OTHER): Payer: Medicaid Other | Admitting: Urology

## 2022-12-28 VITALS — BP 122/73 | HR 63 | Ht 72.0 in | Wt 174.0 lb

## 2022-12-28 DIAGNOSIS — N35116 Postinfective urethral stricture, not elsewhere classified, male, overlapping sites: Secondary | ICD-10-CM | POA: Diagnosis not present

## 2022-12-28 DIAGNOSIS — Z87440 Personal history of urinary (tract) infections: Secondary | ICD-10-CM

## 2022-12-28 DIAGNOSIS — Z87442 Personal history of urinary calculi: Secondary | ICD-10-CM

## 2022-12-28 LAB — MICROSCOPIC EXAMINATION: RBC, Urine: NONE SEEN /hpf (ref 0–2)

## 2022-12-28 LAB — URINALYSIS, ROUTINE W REFLEX MICROSCOPIC
Bilirubin, UA: NEGATIVE
Glucose, UA: NEGATIVE
Ketones, UA: NEGATIVE
Leukocytes,UA: NEGATIVE
Nitrite, UA: NEGATIVE
RBC, UA: NEGATIVE
Specific Gravity, UA: 1.02 (ref 1.005–1.030)
Urobilinogen, Ur: 0.2 mg/dL (ref 0.2–1.0)
pH, UA: 6 (ref 5.0–7.5)

## 2022-12-28 NOTE — Progress Notes (Signed)
Subjective: 1. Personal history of urinary infection   2. 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 History of nephrolithiasis      12/28/22: [name] [name] returns today in f/u for a UA recheck for his history of urethral strictures and a UTI.  HIs UA is clear today and he is voiding without new complaints.  He has a reasonable stream but is emptying.   He has nocturia x 1 and no hematuria.     5/16/24[name] returns today in f/u.  His urine is clear following treatment with amoxicillin.  He reports he is voiding better and his IPSS is 11 with nocturia x 2.  He is for cystoscopy today to assess the possible bladder lesion seen on recent US.    [name] [name] is 57 yo male who has a history of stones with prior intervention in 2014 by Dr. Jerre Simon.  He was hospitalized in 1/24 with right flank pain and a CT showed right hydro with possible interval passage of a stone.   He was noted to have chronic cortical atrophy and full bladder but he subsequently voided.  He had an MRI of the abdomen a few days later and the hydronephrosis had resolved.  He has had no further pain.  He had a renal US in 11/23 that showed a sessile polypoid lesion in the bladder but that was not seen on CT.   He has had chronic pyuria and had yeast in the urine in 1/24.  He is voiding well with an IPSS of 4 and nocturia x 2.  He had AKI in the hospital but the Cr. Was down to 1.27 at DC.  His PSA has been slowly rising but was 2.8 in 2/23.  The doubling time is about 3 years.  He had aerococcus on his urine cultures on 05/19/22 and 03/02/22.  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  ROS:  Review of Systems  All other systems reviewed and are negative.   No Known Allergies  Past Medical History:  Diagnosis Date   Asthma    B12 deficiency 03/15/2022   Cirrhosis (HCC)    Dyspnea    High cholesterol    History of kidney stones    Hypertension    Kidney stones     Past  Surgical History:  Procedure Laterality Date   APPENDECTOMY     BIOPSY  07/22/2020   Procedure: BIOPSY;  Surgeon: Corbin Ade, MD;  [facility];  Service: Endoscopy;;   COLONOSCOPY WITH PROPOFOL N/A 04/18/2017   non-bleeding internal hemorrhoids, two 4-6 mm polyps in descending colon and cecum, pancolonic diverticulosis, single cecal AVM. Tubular adenomas, surveillance in 2023.    COLONOSCOPY WITH PROPOFOL N/A 04/13/2022   pancolonic diverticulosis, multiple polyps, one which was 11 mm (tubular adenomas). Surveillance in 3 years.   ESOPHAGOGASTRODUODENOSCOPY (EGD) WITH PROPOFOL N/A 07/22/2020   normal esophagus, small hiatal hernia, abnormal gastric mucosa, abnormal appearing ampula and periampullary mucosa. Mild chronic gastritis.   ESOPHAGOGASTRODUODENOSCOPY (EGD) WITH PROPOFOL N/A 09/08/2021   normal esophagus, retained food in stomach and duodenum precluded complete examination.   FRACTURE SURGERY     left arm   IR PARACENTESIS  05/31/2021   LOWER EXTREMITY VENOGRAPHY N/A 08/10/2020   Procedure: LOWER EXTREMITY VENOGRAPHY;  Surgeon: Maeola Harman, MD;  Location[facility] INVASIVE CV LAB;  Service: Cardiovascular;  Laterality: N/A;   PERIPHERAL VASCULAR BALLOON ANGIOPLASTY  Left 08/10/2020   Procedure: PERIPHERAL VASCULAR BALLOON ANGIOPLASTY;  Surgeon: Maeola Harman, MD;  Location: [facility] INVASIVE CV LAB;  Service: Cardiovascular;  Laterality: Left;  lower extremity venous   PERIPHERAL VASCULAR THROMBECTOMY N/A 08/10/2020   Procedure: PERIPHERAL VASCULAR THROMBECTOMY;  Surgeon: Maeola Harman, MD;  Location: [facility] INVASIVE CV LAB;  Service: Cardiovascular;  Laterality: N/A;   POLYPECTOMY  04/18/2017   Procedure: POLYPECTOMY;  Surgeon: Corbin Ade, MD;  [facility];  Service: Endoscopy;;   POLYPECTOMY  04/13/2022   Procedure: POLYPECTOMY;  Surgeon: Corbin Ade, MD;  [facility];  Service: Endoscopy;;    Social History    Socioeconomic History   Marital status: Single    Spouse name: Not on file   Number of children: Not on file   Years of education: Not on file   Highest education level: Not on file  Occupational History   Not on file  Tobacco Use   Smoking status: Every Day    Current packs/day: 0.50    Average packs/day: 0.5 packs/day for 33.0 years (16.5 ttl pk-yrs)    Types: Cigarettes    Passive exposure: Current   Smokeless tobacco: Never  Vaping Use   Vaping status: Never Used  Substance and Sexual Activity   Alcohol use: Not Currently    Comment: last deink 1 week ago   Drug use: No   Sexual activity: Yes    Birth control/protection: None  Other Topics Concern   Not on file  Social History Narrative   Not on file   Social Determinants of Health   Financial Resource Strain: High Risk (11/28/2021)   [facility]   Overall Financial Resource Strain (CARDIA)    Difficulty of Paying Living Expenses: Very hard  Food Insecurity: No Food Insecurity (09/23/2022)   Hunger Vital Sign    Worried About Running Out of Food in the Last Year: Never true    Ran Out of Food in the Last Year: Never true  Transportation Needs: No Transportation Needs (09/23/2022)   PRAPARE - Administrator, Civil Service (Medical): No    Lack of Transportation (Non-Medical): No  Physical Activity: Sufficiently Active (09/13/2020)   Exercise Vital Sign    Days of Exercise per Week: 7 days    Minutes of Exercise per Session: 30 min  Stress: No Stress Concern Present (09/13/2020)   [name] [name] [employer] Occupational Health - Occupational Stress Questionnaire    Feeling of Stress : Only a little  Social Connections: Moderately Isolated (09/13/2020)   Social Connection and Isolation Panel [NHANES]    Frequency of Communication with Friends and Family: More than three times a week    Frequency of Social Gatherings with Friends and Family: Once a week    Attends Religious  Services: More than 4 times per year    Active Member of [employer] or Organizations: No    Attends Banker Meetings: Never    Marital Status: Never married  Intimate Partner Violence: Unknown (09/23/2022)   Humiliation, Afraid, Rape, and Kick questionnaire    Fear of Current or Ex-Partner: No    Emotionally Abused: No    Physically Abused: No    Sexually Abused: Patient declined    Family History  Problem Relation Age of Onset   Cancer Father        throat   Diabetes Sister    Colon cancer Neg Hx     Anti-infectives:  Anti-infectives (From admission, onward)    None       Current Outpatient Medications  Medication Sig Dispense Refill   albuterol (PROVENTIL HFA) 108 (90 Base) MCG/ACT inhaler INHALE 2 PUFFS BY MOUTH EVERY 6 HOURS AS NEEDED FOR COUGHING, WHEEZING, OR SHORTNESS OF BREATH (Patient taking differently: Inhale 2 puffs into the lungs every 6 (six) hours as needed for shortness of breath or wheezing.) 18 g 1   aspirin EC 81 MG tablet Take 81 mg by mouth daily.     cyanocobalamin (VITAMIN B12) 1000 MCG/ML injection Inject 1,000 mcg into the muscle every 30 (thirty) days.     furosemide (LASIX) 20 MG tablet Take 1 tablet (20 mg total) by mouth daily. TAKE 2 TABLETS BY MOUTH IN THE MORNING AND 1 TABLET IN THE AFTERNOON     metoprolol tartrate (LOPRESSOR) 25 MG tablet Take 25 mg by mouth See admin instructions. 25 mg twice daily in the morning and afternoon.     mometasone-formoterol (DULERA) 200-5 MCG/ACT AERO Inhale 2 puffs into the lungs 2 (two) times daily. 13 g 3   pantoprazole (PROTONIX) 40 MG tablet TAKE 1 TABLET(40 MG) BY MOUTH DAILY (Patient taking differently: Take 40 mg by mouth daily.) 90 tablet 3   pyridOXINE (VITAMIN B6) 25 MG tablet Take 4 tablets (100 mg total) by mouth daily for 21 days, THEN 1 tablet (25 mg total) daily. 264 tablet 0   sodium bicarbonate 650 MG tablet Take 1 tablet (650 mg total) by mouth 3 (three) times daily. 90 tablet 1    tamsulosin (FLOMAX) 0.4 MG CAPS capsule Take 1 capsule (0.4 mg total) by mouth daily after supper. 30 capsule 2   spironolactone (ALDACTONE) 50 MG tablet TAKE 2 TABLETS BY MOUTH EVERY MORNING AND 1 TABLET DAILY IN THE AFTERNOON 180 tablet 2   No current facility-administered medications for this visit.     Objective: Vital signs in last 24 hours: BP 122/73   Pulse 63   Ht 6' (1.829 m)   Wt 174 lb (78.9 kg)   BMI 23.60 kg/m   Intake/Output from previous day: No intake/output data recorded. Intake/Output this shift: @IOTHISSHIFT @   Physical Exam Vitals reviewed.  Constitutional:      Appearance: Normal appearance.  Neurological:     Mental Status: He is alert.     Lab Results:  Results for orders placed or performed in visit on 12/28/22 (from the past 24 hour(s))  Urinalysis, Routine w reflex microscopic     Status: Abnormal   Collection Time: 12/28/22 10:41 AM  Result Value Ref Range   Specific Gravity, UA 1.020 1.005 - 1.030   pH, UA 6.0 5.0 - 7.5   Color, UA Yellow Yellow   Appearance Ur Clear Clear   Leukocytes,UA Negative Negative   Protein,UA 2+ (A) Negative/Trace   Glucose, UA Negative Negative   Ketones, UA Negative Negative   RBC, UA Negative Negative   Bilirubin, UA Negative Negative   Urobilinogen, Ur 0.2 0.2 - 1.0 mg/dL   Nitrite, UA Negative Negative   Microscopic Examination See below:    Narrative   Performed at:  [address] - [facility] [address] [place], [place]  [id number] Lab Director: Chinita Pester MT, Phone:  [phone]  Microscopic Examination     Status: Abnormal   Collection Time: 12/28/22 10:41 AM   Urine  Result Value Ref Range   WBC, UA 0-5 0 - 5 /hpf   RBC, Urine None seen 0 - 2 /hpf  Epithelial Cells (non renal) 0-10 0 - 10 /hpf   Bacteria, UA Few (A) None seen/Few   Narrative   Performed at:  [address] - [facility] [address], [place], [place]  [id number] Lab Director: Chinita Pester MT, Phone:  [phone]      BMET No results for input(s): "NA", "K", "CL", "CO2", "GLUCOSE", "BUN", "CREATININE", "CALCIUM" in the last 72 hours.  PT/INR No results for input(s): "LABPROT", "INR" in the last 72 hours.  ABG No results for input(s): "PHART", "HCO3" in the last 72 hours.  Invalid input(s): "PCO2", "PO2" .resul Studies/Results: No results found. No results found.   Assessment/Plan: History of stones. He has had no pain or hematuria.  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  His urine remains clear after treatment and he has a reduced but stable stream.  He remains on tamsulosin.     No orders of the defined types were placed in this encounter.    Orders Placed This Encounter  Procedures   Microscopic Examination   Urinalysis, Routine w reflex microscopic     Return if symptoms worsen or fail to improve.    CC: Dr. Avon Gully.      Bjorn Pippin 12/29/2022

## 2023-01-12 ENCOUNTER — Other Ambulatory Visit: Payer: Self-pay | Admitting: Gastroenterology

## 2023-01-12 DIAGNOSIS — R7989 Other specified abnormal findings of blood chemistry: Secondary | ICD-10-CM

## 2023-01-12 DIAGNOSIS — K852 Alcohol induced acute pancreatitis without necrosis or infection: Secondary | ICD-10-CM

## 2023-01-12 DIAGNOSIS — K7030 Alcoholic cirrhosis of liver without ascites: Secondary | ICD-10-CM

## 2023-01-12 NOTE — Telephone Encounter (Signed)
Refills sent to the pt's pharmacy for Lasix

## 2023-01-22 ENCOUNTER — Other Ambulatory Visit (HOSPITAL_COMMUNITY)
Admission: RE | Admit: 2023-01-22 | Discharge: 2023-01-22 | Disposition: A | Discharge status: Home / Self Care | Payer: Medicaid Other | Source: Ambulatory Visit | Attending: Internal Medicine | Admitting: Internal Medicine

## 2023-01-22 ENCOUNTER — Inpatient Hospital Stay: Payer: Medicaid Other | Attending: Physician Assistant

## 2023-01-22 VITALS — BP 129/70 | HR 58 | Temp 97.9°F | Resp 18

## 2023-01-22 DIAGNOSIS — Z0001 Encounter for general adult medical examination with abnormal findings: Secondary | ICD-10-CM | POA: Insufficient documentation

## 2023-01-22 DIAGNOSIS — E538 Deficiency of other specified B group vitamins: Secondary | ICD-10-CM | POA: Insufficient documentation

## 2023-01-22 LAB — CBC WITH DIFFERENTIAL/PLATELET
Abs Immature Granulocytes: 0.03 10*3/uL (ref 0.00–0.07)
Basophils Absolute: 0.1 10*3/uL (ref 0.0–0.1)
Basophils Relative: 1 %
Eosinophils Absolute: 0.2 10*3/uL (ref 0.0–0.5)
Eosinophils Relative: 2 %
HCT: 38.4 % — ABNORMAL LOW (ref 39.0–52.0)
Hemoglobin: 12.1 g/dL — ABNORMAL LOW (ref 13.0–17.0)
Immature Granulocytes: 0 %
Lymphocytes Relative: 19 %
Lymphs Abs: 1.4 10*3/uL (ref 0.7–4.0)
MCH: 27.6 pg (ref 26.0–34.0)
MCHC: 31.5 g/dL (ref 30.0–36.0)
MCV: 87.5 fL (ref 80.0–100.0)
Monocytes Absolute: 1 10*3/uL (ref 0.1–1.0)
Monocytes Relative: 14 %
Neutro Abs: 4.7 10*3/uL (ref 1.7–7.7)
Neutrophils Relative %: 64 %
Platelets: 385 10*3/uL (ref 150–400)
RBC: 4.39 MIL/uL (ref 4.22–5.81)
RDW: 15.7 % — ABNORMAL HIGH (ref 11.5–15.5)
WBC: 7.4 10*3/uL (ref 4.0–10.5)
nRBC: 0 % (ref 0.0–0.2)

## 2023-01-22 LAB — LIPID PANEL
Cholesterol: 172 mg/dL (ref 0–200)
HDL: 37 mg/dL — ABNORMAL LOW (ref >40)
LDL Cholesterol: 99 mg/dL (ref 0–99)
Total CHOL/HDL Ratio: 4.6 RATIO
Triglycerides: 182 mg/dL — ABNORMAL HIGH (ref <150)
VLDL: 36 mg/dL (ref 0–40)

## 2023-01-22 LAB — HEPATIC FUNCTION PANEL
ALT: 17 U/L (ref 0–44)
AST: 31 U/L (ref 15–41)
Albumin: 3.3 g/dL — ABNORMAL LOW (ref 3.5–5.0)
Alkaline Phosphatase: 99 U/L (ref 38–126)
Bilirubin, Direct: <0.1 mg/dL (ref 0.0–0.2)
Total Bilirubin: 0.5 mg/dL (ref 0.3–1.2)
Total Protein: 8.6 g/dL — ABNORMAL HIGH (ref 6.5–8.1)

## 2023-01-22 LAB — BASIC METABOLIC PANEL
Anion gap: 9 (ref 5–15)
BUN: 59 mg/dL — ABNORMAL HIGH (ref 6–20)
CO2: 22 mmol/L (ref 22–32)
Calcium: 8.7 mg/dL — ABNORMAL LOW (ref 8.9–10.3)
Chloride: 100 mmol/L (ref 98–111)
Creatinine, Ser: 3.24 mg/dL — ABNORMAL HIGH (ref 0.61–1.24)
GFR, Estimated: 21 mL/min — ABNORMAL LOW (ref >60)
Glucose, Bld: 120 mg/dL — ABNORMAL HIGH (ref 70–99)
Potassium: 5.2 mmol/L — ABNORMAL HIGH (ref 3.5–5.1)
Sodium: 131 mmol/L — ABNORMAL LOW (ref 135–145)

## 2023-01-22 MED ORDER — CYANOCOBALAMIN 1000 MCG/ML IJ SOLN
1000.0000 ug | Freq: Once | INTRAMUSCULAR | Status: AC
  Administered 2023-01-22: 1000 ug via INTRAMUSCULAR
  Filled 2023-01-22: qty 1

## 2023-01-22 NOTE — Progress Notes (Signed)
Patient tolerated Vitamin B 12 injection with no complaints voiced.  Site clean and dry with no bruising or swelling noted.  No complaints of pain.  Discharged with vital signs stable and no signs or symptoms of distress noted.   ?

## 2023-01-22 NOTE — Patient Instructions (Signed)
[facility]  Discharge Instructions: Thank you for choosing [facility] to provide your oncology and hematology care.  If you have a lab appointment with the Cancer Center - please note that after April 8th, 2024, all labs will be drawn in the cancer center.  You do not have to check in or register with the main entrance as you have in the past but will complete your check-in in the cancer center.  Wear comfortable clothing and clothing appropriate for easy access to any Portacath or PICC line.   We strive to give you quality time with your provider. You may need to reschedule your appointment if you arrive late (15 or more minutes).  Arriving late affects you and other patients whose appointments are after yours.  Also, if you miss three or more appointments without notifying the office, you may be dismissed from the clinic at the provider's discretion.      For prescription refill requests, have your pharmacy contact our office and allow 72 hours for refills to be completed.    Today you received the following Vitamin B 12 injection  Vitamin B12 Injection What is this medication? Vitamin B12 (VAHY tuh min B12) prevents and treats low vitamin B12 levels in your body. It is used in people who do not get enough vitamin B12 from their diet or when their digestive tract does not absorb enough. Vitamin B12 plays an important role in maintaining the health of your nervous system and red blood cells. This medicine may be used for other purposes; ask your health care provider or pharmacist if you have questions. COMMON BRAND NAME(S): B-12 Compliance Kit, B-12 Injection Kit, Cyomin, Dodex, LA-12, Nutri-Twelve, Physicians EZ Use B-12, Primabalt, Vitamin Deficiency Injectable System - B12 What should I tell my care team before I take this medication? They need to know if you have any of these conditions: Kidney disease Leber's disease Megaloblastic anemia An unusual or  allergic reaction to cyanocobalamin, cobalt, other medications, foods, dyes, or preservatives Pregnant or trying to get pregnant Breast-feeding How should I use this medication? This medication is injected into a muscle or deeply under the skin. It is usually given in a clinic or care team's office. However, your care team may teach you how to inject yourself. Follow all instructions. Talk to your care team about the use of this medication in children. Special care may be needed. Overdosage: If you think you have taken too much of this medicine contact a poison control center or emergency room at once. NOTE: This medicine is only for you. Do not share this medicine with others. What if I miss a dose? If you are given your dose at a clinic or care team's office, call to reschedule your appointment. If you give your own injections, and you miss a dose, take it as soon as you can. If it is almost time for your next dose, take only that dose. Do not take double or extra doses. What may interact with this medication? Alcohol Colchicine This list may not describe all possible interactions. Give your health care provider a list of all the medicines, herbs, non-prescription drugs, or dietary supplements you use. Also tell them if you smoke, drink alcohol, or use illegal drugs. Some items may interact with your medicine. What should I watch for while using this medication? Visit your care team regularly. You may need blood work done while you are taking this medication. You may need to follow  a special diet. Talk to your care team. Limit your alcohol intake and avoid smoking to get the best benefit. What side effects may I notice from receiving this medication? Side effects that you should report to your care team as soon as possible: Allergic reactions--skin rash, itching, hives, swelling of the face, lips, tongue, or throat Swelling of the ankles, hands, or feet Trouble breathing Side effects that  usually do not require medical attention (report to your care team if they continue or are bothersome): Diarrhea This list may not describe all possible side effects. Call your doctor for medical advice about side effects. You may report side effects to FDA at 1-800-FDA-1088. Where should I keep my medication? Keep out of the reach of children. Store at room temperature between 15 and 30 degrees C (59 and 85 degrees F). Protect from light. Throw away any unused medication after the expiration date. NOTE: This sheet is a summary. It may not cover all possible information. If you have questions about this medicine, talk to your doctor, pharmacist, or health care provider.  2024 Elsevier/Gold Standard (2020-12-28 00:00:00)     To help prevent nausea and vomiting after your treatment, we encourage you to take your nausea medication as directed.  BELOW ARE SYMPTOMS THAT SHOULD BE REPORTED IMMEDIATELY: *FEVER GREATER THAN 100.4 F (38 C) OR HIGHER *CHILLS OR SWEATING *NAUSEA AND VOMITING THAT IS NOT CONTROLLED WITH YOUR NAUSEA MEDICATION *UNUSUAL SHORTNESS OF BREATH *UNUSUAL BRUISING OR BLEEDING *URINARY PROBLEMS (pain or burning when urinating, or frequent urination) *BOWEL PROBLEMS (unusual diarrhea, constipation, pain near the anus) TENDERNESS IN MOUTH AND THROAT WITH OR WITHOUT PRESENCE OF ULCERS (sore throat, sores in mouth, or a toothache) UNUSUAL RASH, SWELLING OR PAIN  UNUSUAL VAGINAL DISCHARGE OR ITCHING   Items with * indicate a potential emergency and should be followed up as soon as possible or go to the Emergency Department if any problems should occur.  Please show the CHEMOTHERAPY ALERT CARD or IMMUNOTHERAPY ALERT CARD at check-in to the Emergency Department and triage nurse.  Should you have questions after your visit or need to cancel or reschedule your appointment, please contact [facility] [phone]  and follow the prompts.  Office hours are 8:00  a.m. to 4:30 p.m. Monday - Friday. Please note that voicemails left after 4:00 p.m. may not be returned until the following business day.  We are closed weekends and major holidays. You have access to a nurse at all times for urgent questions. Please call the main number to the clinic [phone] and follow the prompts.  For any non-urgent questions, you may also contact your provider using MyChart. We now offer e-Visits for anyone 61 and older to request care online for non-urgent symptoms. For details visit [email or web address].   Also download the MyChart app! Go to the app store, search "MyChart", open the app, select [place], and log in with your MyChart username and password.

## 2023-02-21 ENCOUNTER — Inpatient Hospital Stay: Payer: Medicaid Other | Attending: Physician Assistant

## 2023-02-21 VITALS — BP 113/67 | HR 75 | Temp 97.6°F | Resp 16

## 2023-02-21 DIAGNOSIS — E538 Deficiency of other specified B group vitamins: Secondary | ICD-10-CM | POA: Insufficient documentation

## 2023-02-21 MED ORDER — CYANOCOBALAMIN 1000 MCG/ML IJ SOLN
1000.0000 ug | Freq: Once | INTRAMUSCULAR | Status: AC
  Administered 2023-02-21: 1000 ug via INTRAMUSCULAR
  Filled 2023-02-21: qty 1

## 2023-02-21 NOTE — Progress Notes (Signed)
Patient presents today for B12 injection per providers order.  Vital signs WNL.  Patient has no new complaints at this time.  Stable during administration without incident; injection site WNL; see MAR for injection details.  Patient tolerated procedure well and without incident.  No questions or complaints noted at this time.  

## 2023-02-21 NOTE — Patient Instructions (Signed)
[facility] [place]  Discharge Instructions: Thank you for choosing [facility] to provide your oncology and hematology care.  If you have a lab appointment with the Cancer Center - please note that after April 8th, 2024, all labs will be drawn in the cancer center.  You do not have to check in or register with the main entrance as you have in the past but will complete your check-in in the cancer center.  Wear comfortable clothing and clothing appropriate for easy access to any Portacath or PICC line.   We strive to give you quality time with your provider. You may need to reschedule your appointment if you arrive late (15 or more minutes).  Arriving late affects you and other patients whose appointments are after yours.  Also, if you miss three or more appointments without notifying the office, you may be dismissed from the clinic at the provider's discretion.      For prescription refill requests, have your pharmacy contact our office and allow 72 hours for refills to be completed.    Today you received the following chemotherapy and/or immunotherapy agents B12      To help prevent nausea and vomiting after your treatment, we encourage you to take your nausea medication as directed.  BELOW ARE SYMPTOMS THAT SHOULD BE REPORTED IMMEDIATELY: *FEVER GREATER THAN 100.4 F (38 C) OR HIGHER *CHILLS OR SWEATING *NAUSEA AND VOMITING THAT IS NOT CONTROLLED WITH YOUR NAUSEA MEDICATION *UNUSUAL SHORTNESS OF BREATH *UNUSUAL BRUISING OR BLEEDING *URINARY PROBLEMS (pain or burning when urinating, or frequent urination) *BOWEL PROBLEMS (unusual diarrhea, constipation, pain near the anus) TENDERNESS IN MOUTH AND THROAT WITH OR WITHOUT PRESENCE OF ULCERS (sore throat, sores in mouth, or a toothache) UNUSUAL RASH, SWELLING OR PAIN  UNUSUAL VAGINAL DISCHARGE OR ITCHING   Items with * indicate a potential emergency and should be followed up as soon as possible or go to the  Emergency Department if any problems should occur.  Please show the CHEMOTHERAPY ALERT CARD or IMMUNOTHERAPY ALERT CARD at check-in to the Emergency Department and triage nurse.  Should you have questions after your visit or need to cancel or reschedule your appointment, please contact [facility] [place] [phone]  and follow the prompts.  Office hours are 8:00 a.m. to 4:30 p.m. Monday - Friday. Please note that voicemails left after 4:00 p.m. may not be returned until the following business day.  We are closed weekends and major holidays. You have access to a nurse at all times for urgent questions. Please call the main number to the clinic [phone] and follow the prompts.  For any non-urgent questions, you may also contact your provider using MyChart. We now offer e-Visits for anyone 18 and older to request care online for non-urgent symptoms. For details visit [email or web address].   Also download the MyChart app! Go to the app store, search "MyChart", open the app, select [facility], and log in with your MyChart username and password.   

## 2023-02-24 ENCOUNTER — Emergency Department (HOSPITAL_COMMUNITY)
Admission: EM | Admit: 2023-02-24 | Discharge: 2023-02-25 | Disposition: A | Discharge status: Home / Self Care | Payer: Medicaid Other | Attending: Emergency Medicine | Admitting: Emergency Medicine

## 2023-02-24 ENCOUNTER — Encounter (HOSPITAL_COMMUNITY): Payer: Self-pay | Admitting: Emergency Medicine

## 2023-02-24 ENCOUNTER — Other Ambulatory Visit: Payer: Self-pay

## 2023-02-24 ENCOUNTER — Emergency Department (HOSPITAL_COMMUNITY): Payer: Medicaid Other

## 2023-02-24 DIAGNOSIS — Z7951 Long term (current) use of inhaled steroids: Secondary | ICD-10-CM | POA: Diagnosis not present

## 2023-02-24 DIAGNOSIS — M7989 Other specified soft tissue disorders: Secondary | ICD-10-CM | POA: Insufficient documentation

## 2023-02-24 DIAGNOSIS — S31113A Laceration without foreign body of abdominal wall, right lower quadrant without penetration into peritoneal cavity, initial encounter: Secondary | ICD-10-CM | POA: Diagnosis not present

## 2023-02-24 DIAGNOSIS — S01112A Laceration without foreign body of left eyelid and periocular area, initial encounter: Secondary | ICD-10-CM | POA: Insufficient documentation

## 2023-02-24 DIAGNOSIS — W540XXA Bitten by dog, initial encounter: Secondary | ICD-10-CM | POA: Diagnosis not present

## 2023-02-24 DIAGNOSIS — S52691A Other fracture of lower end of right ulna, initial encounter for closed fracture: Secondary | ICD-10-CM | POA: Diagnosis not present

## 2023-02-24 DIAGNOSIS — I129 Hypertensive chronic kidney disease with stage 1 through stage 4 chronic kidney disease, or unspecified chronic kidney disease: Secondary | ICD-10-CM | POA: Diagnosis not present

## 2023-02-24 DIAGNOSIS — S31119A Laceration without foreign body of abdominal wall, unspecified quadrant without penetration into peritoneal cavity, initial encounter: Secondary | ICD-10-CM

## 2023-02-24 DIAGNOSIS — F1090 Alcohol use, unspecified, uncomplicated: Secondary | ICD-10-CM | POA: Insufficient documentation

## 2023-02-24 DIAGNOSIS — Y904 Blood alcohol level of 80-99 mg/100 ml: Secondary | ICD-10-CM | POA: Diagnosis not present

## 2023-02-24 DIAGNOSIS — N189 Chronic kidney disease, unspecified: Secondary | ICD-10-CM | POA: Insufficient documentation

## 2023-02-24 DIAGNOSIS — S51811A Laceration without foreign body of right forearm, initial encounter: Secondary | ICD-10-CM | POA: Diagnosis not present

## 2023-02-24 DIAGNOSIS — Z23 Encounter for immunization: Secondary | ICD-10-CM | POA: Insufficient documentation

## 2023-02-24 DIAGNOSIS — S61511A Laceration without foreign body of right wrist, initial encounter: Secondary | ICD-10-CM | POA: Insufficient documentation

## 2023-02-24 DIAGNOSIS — Z7982 Long term (current) use of aspirin: Secondary | ICD-10-CM | POA: Diagnosis not present

## 2023-02-24 DIAGNOSIS — S060XAA Concussion with loss of consciousness status unknown, initial encounter: Secondary | ICD-10-CM | POA: Diagnosis not present

## 2023-02-24 DIAGNOSIS — Y9301 Activity, walking, marching and hiking: Secondary | ICD-10-CM | POA: Diagnosis not present

## 2023-02-24 DIAGNOSIS — S0990XA Unspecified injury of head, initial encounter: Secondary | ICD-10-CM | POA: Diagnosis present

## 2023-02-24 DIAGNOSIS — Y92009 Unspecified place in unspecified non-institutional (private) residence as the place of occurrence of the external cause: Secondary | ICD-10-CM | POA: Insufficient documentation

## 2023-02-24 DIAGNOSIS — Z79899 Other long term (current) drug therapy: Secondary | ICD-10-CM | POA: Diagnosis not present

## 2023-02-24 DIAGNOSIS — J45909 Unspecified asthma, uncomplicated: Secondary | ICD-10-CM | POA: Diagnosis not present

## 2023-02-24 MED ORDER — TETANUS-DIPHTH-ACELL PERTUSSIS 5-2.5-18.5 LF-MCG/0.5 IM SUSY
0.5000 mL | PREFILLED_SYRINGE | Freq: Once | INTRAMUSCULAR | Status: AC
  Administered 2023-02-25: 0.5 mL via INTRAMUSCULAR
  Filled 2023-02-24: qty 0.5

## 2023-02-24 MED ORDER — FENTANYL CITRATE PF 50 MCG/ML IJ SOSY
25.0000 ug | PREFILLED_SYRINGE | Freq: Once | INTRAMUSCULAR | Status: DC
  Filled 2023-02-24: qty 1

## 2023-02-24 MED ORDER — RABIES IMMUNE GLOBULIN 150 UNIT/ML IM INJ
20.0000 [IU]/kg | INJECTION | Freq: Once | INTRAMUSCULAR | Status: AC
Start: 2023-02-24 — End: 2023-02-25
  Administered 2023-02-25: 1575 [IU] via INTRAMUSCULAR
  Filled 2023-02-24 (×2): qty 12

## 2023-02-24 MED ORDER — RABIES VACCINE, PCEC IM SUSR
1.0000 mL | Freq: Once | INTRAMUSCULAR | Status: AC
Start: 2023-02-24 — End: 2023-02-25
  Administered 2023-02-25: 1 mL via INTRAMUSCULAR
  Filled 2023-02-24: qty 1

## 2023-02-24 NOTE — ED Provider Notes (Signed)
[place] EMERGENCY DEPARTMENT AT [facility] Provider Note   CSN: [id number] Arrival date & time: 02/24/23  2218     History {Add pertinent medical, surgical, social history, OB history to HPI:1} Chief Complaint  Patient presents with   Animal Bite   Fall   Laceration    [name] [name] is a 57 y.o. male.  The history is provided by the patient and a relative.  Patient w/history of cirrhosis, chronic kidney disease, hypertension, alcohol use disorder presents after dog bite.  Patient reports he was going to get another alcoholic beverage when he was attacked by an unknown dog.  He reports multiple bites to his arms and abdomen.  At some point during this attack, patient did fall and hit his head but he is unable to recall details.  Patient reports his last drink was just within the past 1 to 2 hours    Past Medical History:  Diagnosis Date   Asthma    B12 deficiency 03/15/2022   Cirrhosis (HCC)    Dyspnea    High cholesterol    History of kidney stones    Hypertension    Kidney stones     Home Medications Prior to Admission medications   Medication Sig Start Date End Date Taking? Authorizing Provider  albuterol (PROVENTIL HFA) 108 (90 Base) MCG/ACT inhaler INHALE 2 PUFFS BY MOUTH EVERY 6 HOURS AS NEEDED FOR COUGHING, WHEEZING, OR SHORTNESS OF BREATH Patient taking differently: Inhale 2 puffs into the lungs every 6 (six) hours as needed for shortness of breath or wheezing. 02/10/21   Shon Hale, MD  aspirin EC 81 MG tablet Take 81 mg by mouth daily.    [provider]  cyanocobalamin (VITAMIN B12) 1000 MCG/ML injection Inject 1,000 mcg into the muscle every 30 (thirty) days.    [provider]  furosemide (LASIX) 20 MG tablet TAKE 2 TABLETS BY MOUTH EVERY MORNING AND 1 TABLET IN THE AFTERNOON 01/12/23   Gelene Mink, NP  metoprolol tartrate (LOPRESSOR) 25 MG tablet Take 25 mg by mouth See admin instructions. 25 mg twice daily in the morning  and afternoon. 05/24/21   [provider]  mometasone-formoterol (DULERA) 200-5 MCG/ACT AERO Inhale 2 puffs into the lungs 2 (two) times daily. 02/10/21   Shon Hale, MD  pantoprazole (PROTONIX) 40 MG tablet TAKE 1 TABLET(40 MG) BY MOUTH DAILY Patient taking differently: Take 40 mg by mouth daily. 09/18/22   Gelene Mink, NP  pyridOXINE (VITAMIN B6) 25 MG tablet Take 4 tablets (100 mg total) by mouth daily for 21 days, THEN 1 tablet (25 mg total) daily. 10/06/22 04/25/23  Carnella Guadalajara, PA-C  sodium bicarbonate 650 MG tablet Take 1 tablet (650 mg total) by mouth 3 (three) times daily. 09/25/22   Vassie Loll, MD  spironolactone (ALDACTONE) 50 MG tablet TAKE 2 TABLETS BY MOUTH EVERY MORNING AND 1 TABLET DAILY IN THE AFTERNOON 12/28/22   Gelene Mink, NP  tamsulosin (FLOMAX) 0.4 MG CAPS capsule Take 1 capsule (0.4 mg total) by mouth daily after supper. 09/25/22   Vassie Loll, MD      Allergies    Patient has no known allergies.    Review of Systems   Review of Systems  Constitutional:  Negative for fever.  Skin:  Positive for wound.  Neurological:  Negative for headaches.    Physical Exam Updated Vital Signs BP 119/83 (BP Location: Left Arm)   Pulse 73   Temp 98.1 F (36.7  C) (Oral)   Resp 16   Ht 1.829 m (6')   Wt 79.4 kg   SpO2 99%   BMI 23.73 kg/m  Physical Exam CONSTITUTIONAL: Disheveled, appears older than stated age HEAD: Small laceration above left eyebrow No other signs of trauma EYES: EOMI/PERRL ENMT: Mucous membranes moist, poor dentition NECK: supple no meningeal signs SPINE/BACK:entire spine nontender CV: S1/S2 noted, no murmurs/rubs/gallops noted LUNGS: Lungs are clear to auscultation bilaterally, no apparent distress ABDOMEN: soft, diffuse tenderness Large wound noted to the right abdomen, see photo NEURO: Pt is awake/alert/appropriate, moves all extremitiesx4.  No facial droop.  Tremor noted EXTREMITIES: pulses normal/equal, full ROM,  laceration noted to the right volar wrist, but no bone or tendon exposed.  Full flexion extension is noted of all fingers of the right hand.  Full flexion extension is noted of the right wrist See photo for all other wounds SKIN: warm, color norma see photo l           ED Results / Procedures / Treatments   Labs (all labs ordered are listed, but only abnormal results are displayed) Labs Reviewed  COMPREHENSIVE METABOLIC PANEL  CBC  ETHANOL  PROTIME-INR    EKG None  Radiology No results found.  Procedures Procedures  {Document cardiac monitor, telemetry assessment procedure when appropriate:1}  Medications Ordered in ED Medications  fentaNYL (SUBLIMAZE) injection 25 mcg (has no administration in time range)  rabies immune globulin (HYPERRAB/KEDRAB) injection 1,575 Units (has no administration in time range)  rabies vaccine (RABAVERT) injection 1 mL (has no administration in time range)  Tdap (BOOSTRIX) injection 0.5 mL (has no administration in time range)    ED Course/ Medical Decision Making/ A&P   {   Click here for ABCD2, HEART and other calculatorsREFRESH Note before signing :1}                              Medical Decision Making Amount and/or Complexity of Data Reviewed Labs: ordered. Radiology: ordered.  Risk Prescription drug management.   ***  {Document critical care time when appropriate:1} {Document review of labs and clinical decision tools ie heart score, Chads2Vasc2 etc:1}  {Document your independent review of radiology images, and any outside records:1} {Document your discussion with family members, caretakers, and with consultants:1} {Document social determinants of health affecting pt's care:1} {Document your decision making why or why not admission, treatments were needed:1} Final Clinical Impression(s) / ED Diagnoses Final diagnoses:  None    Rx / DC Orders ED Discharge Orders     None

## 2023-02-24 NOTE — ED Triage Notes (Signed)
Pt was brought in by EMS after walking to friends house when an unknown dog bit him in multiple places. EMS states pt has dog bite to R wrist (wrapped by EMS), R abdomen (dsg placed by EMS-bleeding uncontrolled), scratch to L forearm, and "teeth marks" to L abdomen. EMS reports police were called but were sent to another call before they could investigate. Pt made it to friends house and went to use restroom when he tripped and fell sustaining a laceration above R eyebrow. Denies LOC.

## 2023-02-24 NOTE — ED Notes (Signed)
Police and Research scientist (life sciences) Notified prior to Pt arrival. Police present at bedside currently taking report of the incident form the Pt.

## 2023-02-24 NOTE — ED Notes (Signed)
Patient transported to CT 

## 2023-02-25 LAB — COMPREHENSIVE METABOLIC PANEL
ALT: 12 U/L (ref 0–44)
AST: 29 U/L (ref 15–41)
Albumin: 3.4 g/dL — ABNORMAL LOW (ref 3.5–5.0)
Alkaline Phosphatase: 73 U/L (ref 38–126)
Anion gap: 12 (ref 5–15)
BUN: 43 mg/dL — ABNORMAL HIGH (ref 6–20)
CO2: 18 mmol/L — ABNORMAL LOW (ref 22–32)
Calcium: 8.6 mg/dL — ABNORMAL LOW (ref 8.9–10.3)
Chloride: 102 mmol/L (ref 98–111)
Creatinine, Ser: 2.93 mg/dL — ABNORMAL HIGH (ref 0.61–1.24)
GFR, Estimated: 24 mL/min — ABNORMAL LOW (ref >60)
Glucose, Bld: 89 mg/dL (ref 70–99)
Potassium: 4.9 mmol/L (ref 3.5–5.1)
Sodium: 132 mmol/L — ABNORMAL LOW (ref 135–145)
Total Bilirubin: 0.4 mg/dL (ref 0.3–1.2)
Total Protein: 8.5 g/dL — ABNORMAL HIGH (ref 6.5–8.1)

## 2023-02-25 LAB — ETHANOL: Alcohol, Ethyl (B): 83 mg/dL — ABNORMAL HIGH (ref <10)

## 2023-02-25 MED ORDER — LIDOCAINE-EPINEPHRINE (PF) 2 %-1:200000 IJ SOLN
INTRAMUSCULAR | Status: AC
  Administered 2023-02-25: 20 mL
  Filled 2023-02-25: qty 20

## 2023-02-25 MED ORDER — AMOXICILLIN-POT CLAVULANATE 875-125 MG PO TABS
1.0000 | ORAL_TABLET | Freq: Once | ORAL | Status: AC
  Administered 2023-02-25: 1 via ORAL
  Filled 2023-02-25: qty 1

## 2023-02-25 MED ORDER — AMOXICILLIN-POT CLAVULANATE 500-125 MG PO TABS: 1.0000 | ORAL_TABLET | Freq: Two times a day (BID) | ORAL | 0 refills | Status: DC

## 2023-02-25 MED ORDER — OXYCODONE HCL 5 MG PO TABS
5.0000 mg | ORAL_TABLET | Freq: Once | ORAL | Status: AC
  Administered 2023-02-25: 5 mg via ORAL
  Filled 2023-02-25: qty 1

## 2023-02-25 MED ORDER — HYDROCODONE-ACETAMINOPHEN 5-325 MG PO TABS
2.0000 | ORAL_TABLET | Freq: Once | ORAL | Status: AC
  Administered 2023-02-25: 2 via ORAL
  Filled 2023-02-25: qty 2

## 2023-02-25 NOTE — Discharge Instructions (Addendum)
For your Arm injury did see Dr. Romeo Apple in about a week For your abdominal injury, please return to the ER on Tuesday morning to have the wound looked at You can also receive your repeat rabies shots                                   RABIES VACCINE FOLLOW UP  Patient's Name: [name] [name]                     Original Order Date:02/24/2023  Medical Record Number: [id number]  ED Physician: Zadie Rhine, MD Primary Diagnosis: Rabies Exposure       PCP: Benetta Spar, MD  Patient Phone Number: (home) [phone] (home)    (cell)  Telephone Information:  Mobile [phone]    (work) There is no work phone number on file. Species of Animal:     You have been seen in the Emergency Department for a possible rabies exposure. It's very important you return for the additional vaccine doses.  Please call the clinic listed below for hours of operation.   Clinic that will administer your rabies vaccines:    DAY 0:  02/24/2023      DAY 3:  02/27/2023       DAY 7:  03/03/2023     DAY 14:  03/10/2023         The 5th vaccine injection is considered for immune compromised patients only.  DAY 28:  03/24/2023

## 2023-02-25 NOTE — ED Notes (Signed)
ED Provider at bedside. 

## 2023-02-28 ENCOUNTER — Other Ambulatory Visit: Payer: Self-pay

## 2023-02-28 ENCOUNTER — Encounter (HOSPITAL_COMMUNITY): Payer: Self-pay

## 2023-02-28 ENCOUNTER — Emergency Department (HOSPITAL_COMMUNITY)
Admission: EM | Admit: 2023-02-28 | Discharge: 2023-02-28 | Disposition: A | Discharge status: Home / Self Care | Payer: Medicaid Other | Attending: Emergency Medicine | Admitting: Emergency Medicine

## 2023-02-28 DIAGNOSIS — Z4800 Encounter for change or removal of nonsurgical wound dressing: Secondary | ICD-10-CM | POA: Insufficient documentation

## 2023-02-28 DIAGNOSIS — S31119D Laceration without foreign body of abdominal wall, unspecified quadrant without penetration into peritoneal cavity, subsequent encounter: Secondary | ICD-10-CM | POA: Insufficient documentation

## 2023-02-28 DIAGNOSIS — Z203 Contact with and (suspected) exposure to rabies: Secondary | ICD-10-CM | POA: Insufficient documentation

## 2023-02-28 DIAGNOSIS — W540XXD Bitten by dog, subsequent encounter: Secondary | ICD-10-CM | POA: Insufficient documentation

## 2023-02-28 DIAGNOSIS — Z23 Encounter for immunization: Secondary | ICD-10-CM | POA: Insufficient documentation

## 2023-02-28 DIAGNOSIS — Z5189 Encounter for other specified aftercare: Secondary | ICD-10-CM

## 2023-02-28 DIAGNOSIS — S41151D Open bite of right upper arm, subsequent encounter: Secondary | ICD-10-CM | POA: Diagnosis not present

## 2023-02-28 MED ORDER — RABIES VACCINE, PCEC IM SUSR
1.0000 mL | Freq: Once | INTRAMUSCULAR | Status: AC
  Administered 2023-02-28: 1 mL via INTRAMUSCULAR
  Filled 2023-02-28: qty 1

## 2023-02-28 NOTE — Discharge Instructions (Signed)
See Orthopaedist as scheduled for arm fracture.

## 2023-02-28 NOTE — ED Triage Notes (Signed)
Pt to er, pt states that he is here for a wound check and rabies shot, states that there is no puss or redness.

## 2023-02-28 NOTE — ED Provider Notes (Signed)
[place] EMERGENCY DEPARTMENT AT [facility] Provider Note   CSN: [id number] Arrival date & time: 02/28/23  7824     History  Chief Complaint  Patient presents with   Wound Check    [name] [name] is a 57 y.o. male.  Patient is here for a wound recheck.  Patient had multiple dog bites 3 days ago.  Patient is also scheduled to have a tetanus immunization.  Patient is on Augmentin.  He denies any signs of infection.  He has not had any fever or chills.  Patient is scheduled to see the orthopedist for follow-up of fracture.  The history is provided by the patient. No language interpreter was used.  Wound Check       Home Medications Prior to Admission medications   Medication Sig Start Date End Date Taking? Authorizing Provider  albuterol (PROVENTIL HFA) 108 (90 Base) MCG/ACT inhaler INHALE 2 PUFFS BY MOUTH EVERY 6 HOURS AS NEEDED FOR COUGHING, WHEEZING, OR SHORTNESS OF BREATH Patient taking differently: Inhale 2 puffs into the lungs every 6 (six) hours as needed for shortness of breath or wheezing. 02/10/21   Shon Hale, MD  amoxicillin-clavulanate (AUGMENTIN) 500-125 MG tablet Take 1 tablet by mouth 2 (two) times daily. 02/25/23   Zadie Rhine, MD  aspirin EC 81 MG tablet Take 81 mg by mouth daily.    [provider]  cyanocobalamin (VITAMIN B12) 1000 MCG/ML injection Inject 1,000 mcg into the muscle every 30 (thirty) days.    [provider]  furosemide (LASIX) 20 MG tablet TAKE 2 TABLETS BY MOUTH EVERY MORNING AND 1 TABLET IN THE AFTERNOON 01/12/23   Gelene Mink, NP  metoprolol tartrate (LOPRESSOR) 25 MG tablet Take 25 mg by mouth See admin instructions. 25 mg twice daily in the morning and afternoon. 05/24/21   [provider]  mometasone-formoterol (DULERA) 200-5 MCG/ACT AERO Inhale 2 puffs into the lungs 2 (two) times daily. 02/10/21   Shon Hale, MD  pantoprazole (PROTONIX) 40 MG tablet TAKE 1 TABLET(40 MG) BY MOUTH  DAILY Patient taking differently: Take 40 mg by mouth daily. 09/18/22   Gelene Mink, NP  pyridOXINE (VITAMIN B6) 25 MG tablet Take 4 tablets (100 mg total) by mouth daily for 21 days, THEN 1 tablet (25 mg total) daily. 10/06/22 04/25/23  Carnella Guadalajara, PA-C  sodium bicarbonate 650 MG tablet Take 1 tablet (650 mg total) by mouth 3 (three) times daily. 09/25/22   Vassie Loll, MD  spironolactone (ALDACTONE) 50 MG tablet TAKE 2 TABLETS BY MOUTH EVERY MORNING AND 1 TABLET DAILY IN THE AFTERNOON 12/28/22   Gelene Mink, NP  tamsulosin (FLOMAX) 0.4 MG CAPS capsule Take 1 capsule (0.4 mg total) by mouth daily after supper. 09/25/22   Vassie Loll, MD      Allergies    Patient has no known allergies.    Review of Systems   Review of Systems  All other systems reviewed and are negative.   Physical Exam Updated Vital Signs BP 124/78 (BP Location: Left Arm)   Pulse 87   Temp 97.9 F (36.6 C) (Oral)   Resp 16   Ht 6' (1.829 m)   Wt 79.4 kg   SpO2 99%   BMI 23.73 kg/m  Physical Exam Vitals and nursing note reviewed.  Constitutional:      Appearance: He is well-developed.  HENT:     Head: Normocephalic.  Cardiovascular:     Rate and Rhythm: Normal rate.  Pulmonary:  Effort: Pulmonary effort is normal.  Abdominal:     General: There is no distension.     Comments: Healing lacerations abdomen right and left side no sign of infection  Musculoskeletal:        General: Normal range of motion.     Comments: Right arm 2 dog bites appear to be healing no sign of infection,  Skin:    General: Skin is warm.  Neurological:     General: No focal deficit present.     Mental Status: He is alert and oriented to person, place, and time.     ED Results / Procedures / Treatments   Labs (all labs ordered are listed, but only abnormal results are displayed) Labs Reviewed - No data to display  EKG None  Radiology No results found.  Procedures Procedures    Medications  Ordered in ED Medications  rabies vaccine (RABAVERT) injection 1 mL (has no administration in time range)    ED Course/ Medical Decision Making/ A&P                                 Medical Decision Making Patient sent here for tetanus immunization and for wound recheck.  Patient has multiple dog bites.  He denies any complaints  Risk Prescription drug management. Risk Details: Patient is on day 3 rabies vaccine.  Wounds are cleaned and bandaged.  Splint is replaced.  Patient is advised to keep follow-up with orthopedist as scheduled.  Patient is to follow-up at urgent care for rabies series.           Final Clinical Impression(s) / ED Diagnoses Final diagnoses:  Visit for wound check    Rx / DC Orders ED Discharge Orders     None      An After Visit Summary was printed and given to the patient.    Elson Areas, [place] 02/28/23 1610    Bethann Berkshire, MD 02/28/23 [phone]

## 2023-02-28 NOTE — ED Notes (Signed)
Bandage applied on R abd region and R splint applied, pt can wiggle fingers, cap refill less than 2

## 2023-03-05 ENCOUNTER — Encounter: Payer: Self-pay | Admitting: Hematology

## 2023-03-05 ENCOUNTER — Ambulatory Visit: Payer: Medicaid Other | Admitting: Orthopedic Surgery

## 2023-03-05 ENCOUNTER — Encounter: Payer: Self-pay | Admitting: Orthopedic Surgery

## 2023-03-05 VITALS — BP 120/73 | HR 102 | Ht 72.0 in | Wt 160.0 lb

## 2023-03-05 DIAGNOSIS — S52691A Other fracture of lower end of right ulna, initial encounter for closed fracture: Secondary | ICD-10-CM

## 2023-03-05 NOTE — Progress Notes (Signed)
Chief Complaint  Patient presents with   Wrist Injury    Right/ 02/24/23   57 yo male with h/o dog bite   Treated in ER with Irrig and wound closure and started on ATBX  Followed for wound check 10/30   Here to rev frx care for ND right ulna frx   No complaints right now    Review of Systems  Constitutional:  Negative for fever.  Neurological:  Negative for tingling.   Past Medical History:  Diagnosis Date   Asthma    B12 deficiency 03/15/2022   Cirrhosis (HCC)    Dyspnea    High cholesterol    History of kidney stones    Hypertension    Kidney stones    Physical Exam Vitals and nursing note reviewed.  Constitutional:      Appearance: Normal appearance.  HENT:     Head: Normocephalic and atraumatic.  Eyes:     General: No scleral icterus.       Right eye: No discharge.        Left eye: No discharge.     Extraocular Movements: Extraocular movements intact.     Conjunctiva/sclera: Conjunctivae normal.     Pupils: Pupils are equal, round, and reactive to light.  Cardiovascular:     Rate and Rhythm: Normal rate.     Pulses: Normal pulses.  Musculoskeletal:     Comments: RT FA -  [place]  all look clean   Skin:    General: Skin is warm and dry.     Capillary Refill: Capillary refill takes less than 2 seconds.  Neurological:     General: No focal deficit present.     Mental Status: He is alert and oriented to person, place, and time.  Psychiatric:        Mood and Affect: Mood normal.        Behavior: Behavior normal.        Thought Content: Thought content normal.        Judgment: Judgment normal.    Xrays from [facility]   My interpretation is: ND frx ulna  A/p  All dressings were removed all extrem wnds cked and look clean   Advise  Finish oral antibiotics Clean w soap and water  Ret prn

## 2023-03-11 ENCOUNTER — Inpatient Hospital Stay (HOSPITAL_COMMUNITY)
Admission: EM | Admit: 2023-03-11 | Discharge: 2023-03-14 | DRG: 438 | Disposition: A | Discharge status: Home / Self Care | Payer: Medicaid Other | Attending: Internal Medicine | Admitting: Internal Medicine

## 2023-03-11 ENCOUNTER — Other Ambulatory Visit: Payer: Self-pay

## 2023-03-11 ENCOUNTER — Encounter: Payer: Self-pay | Admitting: Hematology

## 2023-03-11 ENCOUNTER — Emergency Department (HOSPITAL_COMMUNITY): Payer: Medicaid Other

## 2023-03-11 DIAGNOSIS — E872 Acidosis, unspecified: Secondary | ICD-10-CM | POA: Diagnosis present

## 2023-03-11 DIAGNOSIS — Z79899 Other long term (current) drug therapy: Secondary | ICD-10-CM

## 2023-03-11 DIAGNOSIS — R1011 Right upper quadrant pain: Secondary | ICD-10-CM

## 2023-03-11 DIAGNOSIS — K859 Acute pancreatitis without necrosis or infection, unspecified: Secondary | ICD-10-CM | POA: Diagnosis not present

## 2023-03-11 DIAGNOSIS — Z716 Tobacco abuse counseling: Secondary | ICD-10-CM

## 2023-03-11 DIAGNOSIS — E7800 Pure hypercholesterolemia, unspecified: Secondary | ICD-10-CM | POA: Diagnosis present

## 2023-03-11 DIAGNOSIS — N1832 Chronic kidney disease, stage 3b: Secondary | ICD-10-CM | POA: Diagnosis present

## 2023-03-11 DIAGNOSIS — E781 Pure hyperglyceridemia: Secondary | ICD-10-CM | POA: Diagnosis present

## 2023-03-11 DIAGNOSIS — W540XXS Bitten by dog, sequela: Secondary | ICD-10-CM

## 2023-03-11 DIAGNOSIS — Z7951 Long term (current) use of inhaled steroids: Secondary | ICD-10-CM

## 2023-03-11 DIAGNOSIS — N179 Acute kidney failure, unspecified: Principal | ICD-10-CM | POA: Diagnosis present

## 2023-03-11 DIAGNOSIS — E871 Hypo-osmolality and hyponatremia: Secondary | ICD-10-CM | POA: Diagnosis present

## 2023-03-11 DIAGNOSIS — E785 Hyperlipidemia, unspecified: Secondary | ICD-10-CM | POA: Diagnosis present

## 2023-03-11 DIAGNOSIS — R7401 Elevation of levels of liver transaminase levels: Secondary | ICD-10-CM

## 2023-03-11 DIAGNOSIS — I129 Hypertensive chronic kidney disease with stage 1 through stage 4 chronic kidney disease, or unspecified chronic kidney disease: Secondary | ICD-10-CM | POA: Diagnosis present

## 2023-03-11 DIAGNOSIS — K852 Alcohol induced acute pancreatitis without necrosis or infection: Principal | ICD-10-CM | POA: Diagnosis present

## 2023-03-11 DIAGNOSIS — K7031 Alcoholic cirrhosis of liver with ascites: Secondary | ICD-10-CM | POA: Diagnosis present

## 2023-03-11 DIAGNOSIS — Z7982 Long term (current) use of aspirin: Secondary | ICD-10-CM

## 2023-03-11 DIAGNOSIS — Z203 Contact with and (suspected) exposure to rabies: Secondary | ICD-10-CM | POA: Diagnosis present

## 2023-03-11 DIAGNOSIS — Z87442 Personal history of urinary calculi: Secondary | ICD-10-CM

## 2023-03-11 DIAGNOSIS — Z6821 Body mass index (BMI) 21.0-21.9, adult: Secondary | ICD-10-CM

## 2023-03-11 DIAGNOSIS — K219 Gastro-esophageal reflux disease without esophagitis: Secondary | ICD-10-CM | POA: Diagnosis present

## 2023-03-11 DIAGNOSIS — E538 Deficiency of other specified B group vitamins: Secondary | ICD-10-CM | POA: Diagnosis present

## 2023-03-11 DIAGNOSIS — Z91148 Patient's other noncompliance with medication regimen for other reason: Secondary | ICD-10-CM

## 2023-03-11 DIAGNOSIS — F17200 Nicotine dependence, unspecified, uncomplicated: Secondary | ICD-10-CM | POA: Diagnosis present

## 2023-03-11 DIAGNOSIS — E43 Unspecified severe protein-calorie malnutrition: Secondary | ICD-10-CM | POA: Diagnosis present

## 2023-03-11 DIAGNOSIS — W540XXA Bitten by dog, initial encounter: Secondary | ICD-10-CM

## 2023-03-11 DIAGNOSIS — K7030 Alcoholic cirrhosis of liver without ascites: Secondary | ICD-10-CM | POA: Diagnosis present

## 2023-03-11 DIAGNOSIS — F17210 Nicotine dependence, cigarettes, uncomplicated: Secondary | ICD-10-CM | POA: Diagnosis present

## 2023-03-11 DIAGNOSIS — Z23 Encounter for immunization: Secondary | ICD-10-CM

## 2023-03-11 LAB — COMPREHENSIVE METABOLIC PANEL
ALT: 133 U/L — ABNORMAL HIGH (ref 0–44)
AST: 96 U/L — ABNORMAL HIGH (ref 15–41)
Albumin: 3.1 g/dL — ABNORMAL LOW (ref 3.5–5.0)
Alkaline Phosphatase: 145 U/L — ABNORMAL HIGH (ref 38–126)
Anion gap: 14 (ref 5–15)
BUN: 67 mg/dL — ABNORMAL HIGH (ref 6–20)
CO2: 17 mmol/L — ABNORMAL LOW (ref 22–32)
Calcium: 8 mg/dL — ABNORMAL LOW (ref 8.9–10.3)
Chloride: 95 mmol/L — ABNORMAL LOW (ref 98–111)
Creatinine, Ser: 4.45 mg/dL — ABNORMAL HIGH (ref 0.61–1.24)
GFR, Estimated: 15 mL/min — ABNORMAL LOW (ref >60)
Glucose, Bld: 90 mg/dL (ref 70–99)
Potassium: 4.7 mmol/L (ref 3.5–5.1)
Sodium: 126 mmol/L — ABNORMAL LOW (ref 135–145)
Total Bilirubin: 0.7 mg/dL (ref <1.2)
Total Protein: 8.8 g/dL — ABNORMAL HIGH (ref 6.5–8.1)

## 2023-03-11 LAB — LIPASE, BLOOD: Lipase: 176 U/L — ABNORMAL HIGH (ref 11–51)

## 2023-03-11 LAB — CBC WITH DIFFERENTIAL/PLATELET
Abs Immature Granulocytes: 0.08 10*3/uL — ABNORMAL HIGH (ref 0.00–0.07)
Basophils Absolute: 0.1 10*3/uL (ref 0.0–0.1)
Basophils Relative: 1 %
Eosinophils Absolute: 0.3 10*3/uL (ref 0.0–0.5)
Eosinophils Relative: 3 %
HCT: 36.8 % — ABNORMAL LOW (ref 39.0–52.0)
Hemoglobin: 12.3 g/dL — ABNORMAL LOW (ref 13.0–17.0)
Immature Granulocytes: 1 %
Lymphocytes Relative: 18 %
Lymphs Abs: 1.7 10*3/uL (ref 0.7–4.0)
MCH: 29.2 pg (ref 26.0–34.0)
MCHC: 33.4 g/dL (ref 30.0–36.0)
MCV: 87.4 fL (ref 80.0–100.0)
Monocytes Absolute: 1.3 10*3/uL — ABNORMAL HIGH (ref 0.1–1.0)
Monocytes Relative: 14 %
Neutro Abs: 6.1 10*3/uL (ref 1.7–7.7)
Neutrophils Relative %: 63 %
Platelets: 395 10*3/uL (ref 150–400)
RBC: 4.21 MIL/uL — ABNORMAL LOW (ref 4.22–5.81)
RDW: 16.3 % — ABNORMAL HIGH (ref 11.5–15.5)
WBC: 9.5 10*3/uL (ref 4.0–10.5)
nRBC: 0 % (ref 0.0–0.2)

## 2023-03-11 LAB — URINALYSIS, ROUTINE W REFLEX MICROSCOPIC
Bacteria, UA: NONE SEEN
Bilirubin Urine: NEGATIVE
Glucose, UA: NEGATIVE mg/dL
Hgb urine dipstick: NEGATIVE
Ketones, ur: NEGATIVE mg/dL
Leukocytes,Ua: NEGATIVE
Nitrite: NEGATIVE
Protein, ur: 100 mg/dL — AB
Specific Gravity, Urine: 1.013 (ref 1.005–1.030)
pH: 5 (ref 5.0–8.0)

## 2023-03-11 LAB — TROPONIN I (HIGH SENSITIVITY): Troponin I (High Sensitivity): 2 ng/L (ref <18)

## 2023-03-11 MED ORDER — RABIES VACCINE, PCEC IM SUSR
1.0000 mL | Freq: Once | INTRAMUSCULAR | Status: AC
  Administered 2023-03-11: 1 mL via INTRAMUSCULAR
  Filled 2023-03-11: qty 1

## 2023-03-11 MED ORDER — ALUM & MAG HYDROXIDE-SIMETH 200-200-20 MG/5ML PO SUSP
30.0000 mL | Freq: Once | ORAL | Status: AC
  Administered 2023-03-11: 30 mL via ORAL
  Filled 2023-03-11: qty 30

## 2023-03-11 MED ORDER — MORPHINE SULFATE (PF) 4 MG/ML IV SOLN
4.0000 mg | Freq: Once | INTRAVENOUS | Status: AC
  Administered 2023-03-11: 4 mg via INTRAVENOUS
  Filled 2023-03-11: qty 1

## 2023-03-11 MED ORDER — SODIUM CHLORIDE 0.9 % IV BOLUS
1000.0000 mL | Freq: Once | INTRAVENOUS | Status: AC
  Administered 2023-03-11: 1000 mL via INTRAVENOUS

## 2023-03-11 NOTE — ED Notes (Signed)
Portable xray at bedside.

## 2023-03-11 NOTE — H&P (Signed)
History and Physical    Patient: [name] [name] DOB: 05-28-1965 DOA: 03/11/2023 DOS: the patient was seen and examined on 03/11/2023 PCP: Benetta Spar, MD  Patient coming from: {Point_of_Origin:[address]}  Chief Complaint:  Chief Complaint  Patient presents with   Abdominal Pain   HPI: [name] [name] is a 57 y.o. male with medical history significant of ***  Review of Systems: {ROS_Text:26778} Past Medical History:  Diagnosis Date   Asthma    B12 deficiency 03/15/2022   Cirrhosis (HCC)    Dyspnea    High cholesterol    History of kidney stones    Hypertension    Kidney stones    Past Surgical History:  Procedure Laterality Date   APPENDECTOMY     BIOPSY  07/22/2020   Procedure: BIOPSY;  Surgeon: Corbin Ade, MD;  Location: [facility];  Service: Endoscopy;;   COLONOSCOPY WITH PROPOFOL N/A 04/18/2017   non-bleeding internal hemorrhoids, two 4-6 mm polyps in descending colon and cecum, pancolonic diverticulosis, single cecal AVM. Tubular adenomas, surveillance in 2023.    COLONOSCOPY WITH PROPOFOL N/A 04/13/2022   pancolonic diverticulosis, multiple polyps, one which was 11 mm (tubular adenomas). Surveillance in 3 years.   ESOPHAGOGASTRODUODENOSCOPY (EGD) WITH PROPOFOL N/A 07/22/2020   normal esophagus, small hiatal hernia, abnormal gastric mucosa, abnormal appearing ampula and periampullary mucosa. Mild chronic gastritis.   ESOPHAGOGASTRODUODENOSCOPY (EGD) WITH PROPOFOL N/A 09/08/2021   normal esophagus, retained food in stomach and duodenum precluded complete examination.   FRACTURE SURGERY     left arm   IR PARACENTESIS  05/31/2021   LOWER EXTREMITY VENOGRAPHY N/A 08/10/2020   Procedure: LOWER EXTREMITY VENOGRAPHY;  Surgeon: Maeola Harman, MD;  Location: [facility] INVASIVE CV LAB;  Service: Cardiovascular;  Laterality: N/A;   PERIPHERAL VASCULAR BALLOON ANGIOPLASTY Left 08/10/2020   Procedure: PERIPHERAL VASCULAR BALLOON ANGIOPLASTY;   Surgeon: Maeola Harman, MD;  Location: [facility] INVASIVE CV LAB;  Service: Cardiovascular;  Laterality: Left;  lower extremity venous   PERIPHERAL VASCULAR THROMBECTOMY N/A 08/10/2020   Procedure: PERIPHERAL VASCULAR THROMBECTOMY;  Surgeon: Maeola Harman, MD;  Location: [facility] INVASIVE CV LAB;  Service: Cardiovascular;  Laterality: N/A;   POLYPECTOMY  04/18/2017   Procedure: POLYPECTOMY;  Surgeon: Corbin Ade, MD;  Location: [facility];  Service: Endoscopy;;   POLYPECTOMY  04/13/2022   Procedure: POLYPECTOMY;  Surgeon: Corbin Ade, MD;  Location: [facility];  Service: Endoscopy;;   Social History:  reports that he has been smoking cigarettes. He has a 16.5 pack-year smoking history. He has been exposed to tobacco smoke. He has never used smokeless tobacco. He reports that he does not currently use alcohol. He reports that he does not use drugs.  No Known Allergies  Family History  Problem Relation Age of Onset   Cancer Father        throat   Diabetes Sister    Colon cancer Neg Hx     Prior to Admission medications   Medication Sig Start Date End Date Taking? Authorizing Provider  albuterol (PROVENTIL HFA) 108 (90 Base) MCG/ACT inhaler INHALE 2 PUFFS BY MOUTH EVERY 6 HOURS AS NEEDED FOR COUGHING, WHEEZING, OR SHORTNESS OF BREATH Patient taking differently: Inhale 2 puffs into the lungs every 6 (six) hours as needed for shortness of breath or wheezing. 02/10/21   Shon Hale, MD  amoxicillin-clavulanate (AUGMENTIN) 500-125 MG tablet Take 1 tablet by mouth 2 (two) times daily. 02/25/23   Zadie Rhine, MD  aspirin EC 81  MG tablet Take 81 mg by mouth daily.    [provider]  cyanocobalamin (VITAMIN B12) 1000 MCG/ML injection Inject 1,000 mcg into the muscle every 30 (thirty) days.    [provider]  furosemide (LASIX) 20 MG tablet TAKE 2 TABLETS BY MOUTH EVERY MORNING AND 1 TABLET IN THE AFTERNOON 01/12/23   Gelene Mink, NP  metoprolol  tartrate (LOPRESSOR) 25 MG tablet Take 25 mg by mouth See admin instructions. 25 mg twice daily in the morning and afternoon. 05/24/21   [provider]  mometasone-formoterol (DULERA) 200-5 MCG/ACT AERO Inhale 2 puffs into the lungs 2 (two) times daily. 02/10/21   Shon Hale, MD  pantoprazole (PROTONIX) 40 MG tablet TAKE 1 TABLET(40 MG) BY MOUTH DAILY Patient taking differently: Take 40 mg by mouth daily. 09/18/22   Gelene Mink, NP  pyridOXINE (VITAMIN B6) 25 MG tablet Take 4 tablets (100 mg total) by mouth daily for 21 days, THEN 1 tablet (25 mg total) daily. 10/06/22 04/25/23  Carnella Guadalajara, PA-C  sodium bicarbonate 650 MG tablet Take 1 tablet (650 mg total) by mouth 3 (three) times daily. 09/25/22   Vassie Loll, MD  spironolactone (ALDACTONE) 50 MG tablet TAKE 2 TABLETS BY MOUTH EVERY MORNING AND 1 TABLET DAILY IN THE AFTERNOON 12/28/22   Gelene Mink, NP  tamsulosin (FLOMAX) 0.4 MG CAPS capsule Take 1 capsule (0.4 mg total) by mouth daily after supper. 09/25/22   Vassie Loll, MD    Physical Exam: Vitals:   03/11/23 2040 03/11/23 2041 03/11/23 2100  BP:  114/76 104/72  Pulse:  72 78  Resp:  18 16  Temp:  97.9 F (36.6 C)   TempSrc:  Oral   SpO2:  99% 98%  Weight: 79.4 kg    Height: 6' (1.829 m)     *** Data Reviewed: {Tip this will not be part of the note when signed- Document your independent interpretation of telemetry tracing, EKG, lab, Radiology test or any other diagnostic tests. Add any new diagnostic test ordered today. (Optional):26781} {Results:26384}  Assessment and Plan: No notes have been filed under this hospital service. Service: Hospitalist     Advance Care Planning:   Code Status: Prior ***  Consults: ***  Family Communication: ***  Severity of Illness: {Observation/Inpatient:21159}  Author: Lilyan Gilford, DO 03/11/2023 11:44 PM  For on call review [email or web address].

## 2023-03-11 NOTE — ED Provider Notes (Signed)
[facility] AT [facility] Provider Note   CSN: [id number] Arrival date & time: 03/11/23  2036     History  Chief Complaint  Patient presents with   Abdominal Pain    [name] [name] is a 57 y.o. male with cirrhosis, etoh abuse and recent dog bite to abdomen presents with complaints of abdominal pain, chest pain, shortness of breath.  Patient states his symptoms started about a week ago.  He was bit by a dog in the abdomen about 2 weeks ago he was evaluated here [facility] where the wounds were sutured closed. Of note, he returned to ED for his second rabies shot but did not receive the third.  He does endorse fevers and chills throughout this past week.  He he denies any obvious aggravating or relieving factors.   Abdominal Pain     Past Medical History:  Diagnosis Date   Asthma    B12 deficiency 03/15/2022   Cirrhosis (HCC)    Dyspnea    High cholesterol    History of kidney stones    Hypertension    Kidney stones      Home Medications Prior to Admission medications   Medication Sig Start Date End Date Taking? Authorizing Provider  albuterol (PROVENTIL HFA) 108 (90 Base) MCG/ACT inhaler INHALE 2 PUFFS BY MOUTH EVERY 6 HOURS AS NEEDED FOR COUGHING, WHEEZING, OR SHORTNESS OF BREATH Patient taking differently: Inhale 2 puffs into the lungs every 6 (six) hours as needed for shortness of breath or wheezing. 02/10/21   Shon Hale, MD  amoxicillin-clavulanate (AUGMENTIN) 500-125 MG tablet Take 1 tablet by mouth 2 (two) times daily. 02/25/23   Zadie Rhine, MD  aspirin EC 81 MG tablet Take 81 mg by mouth daily.    [provider]  cyanocobalamin (VITAMIN B12) 1000 MCG/ML injection Inject 1,000 mcg into the muscle every 30 (thirty) days.    [provider]  furosemide (LASIX) 20 MG tablet TAKE 2 TABLETS BY MOUTH EVERY MORNING AND 1 TABLET IN THE AFTERNOON 01/12/23   Gelene Mink, NP  metoprolol tartrate (LOPRESSOR) 25 MG  tablet Take 25 mg by mouth See admin instructions. 25 mg twice daily in the morning and afternoon. 05/24/21   [provider]  mometasone-formoterol (DULERA) 200-5 MCG/ACT AERO Inhale 2 puffs into the lungs 2 (two) times daily. 02/10/21   Shon Hale, MD  pantoprazole (PROTONIX) 40 MG tablet TAKE 1 TABLET(40 MG) BY MOUTH DAILY Patient taking differently: Take 40 mg by mouth daily. 09/18/22   Gelene Mink, NP  pyridOXINE (VITAMIN B6) 25 MG tablet Take 4 tablets (100 mg total) by mouth daily for 21 days, THEN 1 tablet (25 mg total) daily. 10/06/22 04/25/23  Carnella Guadalajara, PA-C  sodium bicarbonate 650 MG tablet Take 1 tablet (650 mg total) by mouth 3 (three) times daily. 09/25/22   Vassie Loll, MD  spironolactone (ALDACTONE) 50 MG tablet TAKE 2 TABLETS BY MOUTH EVERY MORNING AND 1 TABLET DAILY IN THE AFTERNOON 12/28/22   Gelene Mink, NP  tamsulosin (FLOMAX) 0.4 MG CAPS capsule Take 1 capsule (0.4 mg total) by mouth daily after supper. 09/25/22   Vassie Loll, MD      Allergies    Patient has no known allergies.    Review of Systems   Review of Systems  Gastrointestinal:  Positive for abdominal pain.    Physical Exam Updated Vital Signs BP 104/72 (BP Location: Left Arm)   Pulse 78  Temp 97.9 F (36.6 C) (Oral)   Resp 16   Ht 6' (1.829 m)   Wt 79.4 kg   SpO2 98%   BMI 23.73 kg/m  Physical Exam Vitals and nursing note reviewed.  Constitutional:      General: He is not in acute distress.    Appearance: He is well-developed.  HENT:     Head: Normocephalic and atraumatic.  Eyes:     General: Scleral icterus present.     Conjunctiva/sclera: Conjunctivae normal.  Cardiovascular:     Rate and Rhythm: Normal rate and regular rhythm.     Heart sounds: No murmur heard. Pulmonary:     Effort: Pulmonary effort is normal. No respiratory distress.     Breath sounds: Normal breath sounds.  Abdominal:     General: Bowel sounds are normal. There is no distension.      Palpations: Abdomen is soft.     Tenderness: There is abdominal tenderness in the right upper quadrant and epigastric area. There is no rebound. Positive signs include Murphy's sign. Negative signs include McBurney's sign.     Comments: Healing closed wound on right flank and left lower quadrant following dog bite 2 weeks ago.  No erythema, drainage or evidence of infection.  No fluctuance or crepitus  Musculoskeletal:        General: No swelling.     Cervical back: Neck supple.  Skin:    General: Skin is warm and dry.     Capillary Refill: Capillary refill takes less than 2 seconds.  Neurological:     Mental Status: He is alert.  Psychiatric:        Mood and Affect: Mood normal.     ED Results / Procedures / Treatments   Labs (all labs ordered are listed, but only abnormal results are displayed) Labs Reviewed  CBC WITH DIFFERENTIAL/PLATELET - Abnormal; Notable for the following components:      Result Value   RBC 4.21 (*)    Hemoglobin 12.3 (*)    HCT 36.8 (*)    RDW 16.3 (*)    Monocytes Absolute 1.3 (*)    Abs Immature Granulocytes 0.08 (*)    All other components within normal limits  COMPREHENSIVE METABOLIC PANEL - Abnormal; Notable for the following components:   Sodium 126 (*)    Chloride 95 (*)    CO2 17 (*)    BUN 67 (*)    Creatinine, Ser 4.45 (*)    Calcium 8.0 (*)    Total Protein 8.8 (*)    Albumin 3.1 (*)    AST 96 (*)    ALT 133 (*)    Alkaline Phosphatase 145 (*)    GFR, Estimated 15 (*)    All other components within normal limits  LIPASE, BLOOD - Abnormal; Notable for the following components:   Lipase 176 (*)    All other components within normal limits  URINALYSIS, ROUTINE W REFLEX MICROSCOPIC - Abnormal; Notable for the following components:   Protein, ur 100 (*)    All other components within normal limits  TROPONIN I (HIGH SENSITIVITY)  TROPONIN I (HIGH SENSITIVITY)    EKG EKG Interpretation Date/Time:  Sunday March 11 2023 21:15:43  EST Ventricular Rate:  72 PR Interval:  150 QRS Duration:  91 QT Interval:  396 QTC Calculation: 434 R Axis:   15  Text Interpretation: Sinus rhythm Left atrial enlargement Abnormal R-wave progression, early transition Confirmed by Eber Hong ([id number]) on 03/11/2023 9:26:18 PM  Radiology  CT ABDOMEN PELVIS WO CONTRAST  Result Date: 03/11/2023 CLINICAL DATA:  Right-sided abdominal pain that started 1 week ago. Pain by dog on the right side. Wound to abdomen. EXAM: CT ABDOMEN AND PELVIS WITHOUT CONTRAST TECHNIQUE: Multidetector CT imaging of the abdomen and pelvis was performed following the standard protocol without IV contrast. RADIATION DOSE REDUCTION: This exam was performed according to the departmental dose-optimization program which includes automated exposure control, adjustment of the mA and/or kV according to patient size and/or use of iterative reconstruction technique. COMPARISON:  02/25/2023 FINDINGS: Lower chest: No acute abnormality. Hepatobiliary: Cholelithiasis without evidence of acute cholecystitis. No biliary dilation. Mild nodularity of the hepatic contour suggesting cirrhosis. Pancreas: Fullness in the pancreatic head. Question of mild adjacent stranding. Correlate with lipase to exclude pancreatitis. Spleen: Unremarkable. Adrenals/Urinary Tract: Unremarkable adrenal glands. Atrophic left kidney. No urinary calculi or hydronephrosis. Nondistended bladder. Stomach/Bowel: Normal caliber large and small bowel. Colonic diverticulosis without diverticulitis. Normal appendix. Stomach is within normal limits. Vascular/Lymphatic: Aortic atherosclerosis. No enlarged abdominal or pelvic lymph nodes. Reproductive: Unremarkable. Other: No free intraperitoneal fluid or air. Musculoskeletal: Mild subcutaneous fat stranding about the right flank (series 2/image 36), medial right buttock (2/86) and left anterior abdomen (2/48). No edema within the muscles deep to the stranding. No abscess. No soft  tissue gas. No acute fracture. IMPRESSION: 1. Fullness in the pancreatic head with question of mild adjacent stranding. Correlate with lipase to exclude pancreatitis. 2. Mild subcutaneous fat stranding about the right flank, medial right buttock and left anterior abdomen. These likely represents improving contusions. 3. Cholelithiasis without evidence of acute cholecystitis. 4. Mild nodularity of the hepatic contour suggesting cirrhosis. Aortic Atherosclerosis (ICD10-I70.0). Electronically Signed   By: Minerva Fester M.D.   On: 03/11/2023 23:06   DG Chest Portable 1 View  Result Date: 03/11/2023 CLINICAL DATA:  Right-sided abdominal pain for 1 week. Bit by dog right side 2 weeks ago. EXAM: PORTABLE CHEST 1 VIEW COMPARISON:  05/19/2022 FINDINGS: The heart size and mediastinal contours are within normal limits. Both lungs are clear. The visualized skeletal structures are unremarkable. IMPRESSION: No active disease. Electronically Signed   By: Minerva Fester M.D.   On: 03/11/2023 22:18    Procedures Procedures    Medications Ordered in ED Medications  rabies vaccine (RABAVERT) injection 1 mL (has no administration in time range)  alum & mag hydroxide-simeth (MAALOX/MYLANTA) 200-200-20 MG/5ML suspension 30 mL (30 mLs Oral Given 03/11/23 2124)  sodium chloride 0.9 % bolus 1,000 mL (1,000 mLs Intravenous New Bag/Given 03/11/23 2237)  morphine (PF) 4 MG/ML injection 4 mg (4 mg Intravenous Given 03/11/23 2238)    ED Course/ Medical Decision Making/ A&P                                 Medical Decision Making    Patient presents to the ED for concern of right upper quadrant abdominal pain that seems to radiate, this involves an extensive number of treatment options, and is a complaint that carries with it a high risk of complications and morbidity.  The differential diagnosis includes ACS, PE, cholecystitis, pancreatitis, abdominal abscess   Co morbidities that complicate the patient  evaluation  Cirrhosis   Additional history obtained:  Additional history obtained from none    External records from outside source obtained and reviewed including prior ED visit from dog bite   Lab Tests:  I Ordered, and personally interpreted labs.  The pertinent  results include: Creatinine of 4.45, baseline appears to be 2.5.  AST/ALT additionally elevated compared to prior recordings.  GFR is down to 15 and 30.  Lipase is additionally elevated  in 170s. Troponin not elevated   Imaging Studies ordered:  I ordered imaging studies including CXR and  CT abdomen pelvis without contrast I independently visualized and interpreted imaging . CXR showed no active cardiopulmonary disease. CT did show some pancreatic head enlargement with some fat stranding. I agree with the radiologist interpretation   Cardiac Monitoring:  EKG sinus rhythm without ischemic changes   Medicines ordered and prescription drug management:  I ordered medication including GI cocktail for nausea and abdominal discomfort Reevaluation of the patient after these medicines showed that the patient stayed the same I have reviewed the patients home medicines and have made adjustments as needed   Test Considered:  CT with contrast, this was deferred due to patient's AKI   Critical Interventions:  None   Consultations Obtained:  I discussed this patient with my attending, Dr. Hyacinth Meeker, hospitalist was consulted and discussed lab and imaging findings as well as pertinent plan - they recommend: Plan for hospital mission for AKI and pancreatitis.    Problem List / ED Course:  Labs notable for AKI with hyponatremia and pancreatitis.  Patient started on 1 L normal saline.  Patient additionally has not received his third dose of rabies shot from recent dog bite.  This was ordered while here.   Reevaluation:  After the interventions noted above, I reevaluated the patient and found that they have :stayed the  same   Social Determinants of Health:  None   Dispostion:  After consideration of the diagnostic results and the patients response to treatment, I feel that the patent would benefit from hospital admission for AKI and pancreatitis.  Patient will need to return to the ED for his fourth and final rabies shot one week from today.          Final Clinical Impression(s) / ED Diagnoses Final diagnoses:  AKI (acute kidney injury) (HCC)  Right upper quadrant abdominal pain  Transaminitis  Alcohol-induced acute pancreatitis, unspecified complication status    Rx / DC Orders ED Discharge Orders     None         Fabienne Bruns 03/11/23 2353    Eber Hong, MD 03/12/23 1017

## 2023-03-11 NOTE — ED Provider Notes (Signed)
This patient is a 57 year old male, he has a medical history significant for renal insufficiency, his kidney function was approximately 2.2 earlier this year, over the last several months he has had some progressive renal failure but about a week ago he started having epigastric discomfort and has been having vomiting and inability to hold down any food for the last week or so.  He has been drinking some clear liquids including alcohol which he drinks not infrequently.  Because of worsening epigastric pain he came to the ER tonight.  On my exam he has tenderness in the midepigastrium.  Of note the patient did suffer a dog bite from an unknown dog a couple of weeks ago, he came for his first vaccination and immunoglobulin shot and presented 4 days later but did not present after that.  His wounds are healing well, there is no surrounding redness or fevers.  The patient is not tachycardic, he is not hypoxic and is not hypotensive but has consistent and persistent nausea.  On workup the patient has worsening renal function with a creatinine that is close to 4.7, his BUN is in the 60s, his lipase is elevated and his LFTs are elevated, the patient does endorse drinking alcohol watching the football game earlier today before the pain got worse.  CT scan does confirm some signs of pancreatitis, his history certainly fits with this pattern as well.  He was given IV fluids and medications, he will need to be admitted to the hospital for ongoing resuscitation as he does have renal failure.  The patient is agreeable, will page hospitalist for admission  Final diagnoses:  AKI (acute kidney injury) (HCC)  Right upper quadrant abdominal pain  Transaminitis  Alcohol-induced acute pancreatitis, unspecified complication status      Eber Hong, MD 03/11/23 2333

## 2023-03-11 NOTE — ED Triage Notes (Signed)
Patient from home for R side abd pain that started 1 week ago. Patient was bit by a dog on the R side 2 weeks ago, has wound to his abd. Wound does not have any drainage. Patient also reports nausea and vomiting; denies diarrhea or constipation. Has appt we have GI on Tuesday

## 2023-03-11 NOTE — ED Notes (Signed)
Patient transported to CT 

## 2023-03-12 ENCOUNTER — Observation Stay (HOSPITAL_COMMUNITY): Payer: Medicaid Other

## 2023-03-12 ENCOUNTER — Encounter (HOSPITAL_COMMUNITY): Payer: Self-pay | Admitting: Family Medicine

## 2023-03-12 DIAGNOSIS — W540XXA Bitten by dog, initial encounter: Secondary | ICD-10-CM | POA: Insufficient documentation

## 2023-03-12 DIAGNOSIS — K7031 Alcoholic cirrhosis of liver with ascites: Secondary | ICD-10-CM | POA: Diagnosis present

## 2023-03-12 DIAGNOSIS — E538 Deficiency of other specified B group vitamins: Secondary | ICD-10-CM | POA: Diagnosis present

## 2023-03-12 DIAGNOSIS — I129 Hypertensive chronic kidney disease with stage 1 through stage 4 chronic kidney disease, or unspecified chronic kidney disease: Secondary | ICD-10-CM | POA: Diagnosis present

## 2023-03-12 DIAGNOSIS — Z203 Contact with and (suspected) exposure to rabies: Secondary | ICD-10-CM | POA: Diagnosis present

## 2023-03-12 DIAGNOSIS — Z716 Tobacco abuse counseling: Secondary | ICD-10-CM | POA: Diagnosis not present

## 2023-03-12 DIAGNOSIS — R7401 Elevation of levels of liver transaminase levels: Secondary | ICD-10-CM | POA: Diagnosis not present

## 2023-03-12 DIAGNOSIS — N1832 Chronic kidney disease, stage 3b: Secondary | ICD-10-CM | POA: Diagnosis present

## 2023-03-12 DIAGNOSIS — Z87442 Personal history of urinary calculi: Secondary | ICD-10-CM | POA: Diagnosis not present

## 2023-03-12 DIAGNOSIS — E7800 Pure hypercholesterolemia, unspecified: Secondary | ICD-10-CM | POA: Diagnosis present

## 2023-03-12 DIAGNOSIS — E872 Acidosis, unspecified: Secondary | ICD-10-CM | POA: Diagnosis present

## 2023-03-12 DIAGNOSIS — K219 Gastro-esophageal reflux disease without esophagitis: Secondary | ICD-10-CM | POA: Diagnosis present

## 2023-03-12 DIAGNOSIS — F17210 Nicotine dependence, cigarettes, uncomplicated: Secondary | ICD-10-CM | POA: Diagnosis present

## 2023-03-12 DIAGNOSIS — Z7982 Long term (current) use of aspirin: Secondary | ICD-10-CM | POA: Diagnosis not present

## 2023-03-12 DIAGNOSIS — E871 Hypo-osmolality and hyponatremia: Secondary | ICD-10-CM | POA: Diagnosis present

## 2023-03-12 DIAGNOSIS — K852 Alcohol induced acute pancreatitis without necrosis or infection: Secondary | ICD-10-CM | POA: Diagnosis present

## 2023-03-12 DIAGNOSIS — N179 Acute kidney failure, unspecified: Secondary | ICD-10-CM | POA: Diagnosis present

## 2023-03-12 DIAGNOSIS — E781 Pure hyperglyceridemia: Secondary | ICD-10-CM | POA: Diagnosis present

## 2023-03-12 DIAGNOSIS — Z7951 Long term (current) use of inhaled steroids: Secondary | ICD-10-CM | POA: Diagnosis not present

## 2023-03-12 DIAGNOSIS — E43 Unspecified severe protein-calorie malnutrition: Secondary | ICD-10-CM | POA: Diagnosis present

## 2023-03-12 DIAGNOSIS — Z23 Encounter for immunization: Secondary | ICD-10-CM | POA: Diagnosis not present

## 2023-03-12 DIAGNOSIS — Z91148 Patient's other noncompliance with medication regimen for other reason: Secondary | ICD-10-CM | POA: Diagnosis not present

## 2023-03-12 DIAGNOSIS — K859 Acute pancreatitis without necrosis or infection, unspecified: Secondary | ICD-10-CM | POA: Diagnosis present

## 2023-03-12 DIAGNOSIS — Z6821 Body mass index (BMI) 21.0-21.9, adult: Secondary | ICD-10-CM | POA: Diagnosis not present

## 2023-03-12 DIAGNOSIS — Z79899 Other long term (current) drug therapy: Secondary | ICD-10-CM | POA: Diagnosis not present

## 2023-03-12 LAB — CBC WITH DIFFERENTIAL/PLATELET
Abs Immature Granulocytes: 0.07 10*3/uL (ref 0.00–0.07)
Basophils Absolute: 0.1 10*3/uL (ref 0.0–0.1)
Basophils Relative: 1 %
Eosinophils Absolute: 0.4 10*3/uL (ref 0.0–0.5)
Eosinophils Relative: 4 %
HCT: 32.1 % — ABNORMAL LOW (ref 39.0–52.0)
Hemoglobin: 10.9 g/dL — ABNORMAL LOW (ref 13.0–17.0)
Immature Granulocytes: 1 %
Lymphocytes Relative: 21 %
Lymphs Abs: 1.9 10*3/uL (ref 0.7–4.0)
MCH: 29.5 pg (ref 26.0–34.0)
MCHC: 34 g/dL (ref 30.0–36.0)
MCV: 86.8 fL (ref 80.0–100.0)
Monocytes Absolute: 1.3 10*3/uL — ABNORMAL HIGH (ref 0.1–1.0)
Monocytes Relative: 15 %
Neutro Abs: 5.3 10*3/uL (ref 1.7–7.7)
Neutrophils Relative %: 58 %
Platelets: 351 10*3/uL (ref 150–400)
RBC: 3.7 MIL/uL — ABNORMAL LOW (ref 4.22–5.81)
RDW: 16.1 % — ABNORMAL HIGH (ref 11.5–15.5)
WBC: 9 10*3/uL (ref 4.0–10.5)
nRBC: 0 % (ref 0.0–0.2)

## 2023-03-12 LAB — TROPONIN I (HIGH SENSITIVITY): Troponin I (High Sensitivity): 2 ng/L (ref <18)

## 2023-03-12 LAB — COMPREHENSIVE METABOLIC PANEL
ALT: 110 U/L — ABNORMAL HIGH (ref 0–44)
AST: 77 U/L — ABNORMAL HIGH (ref 15–41)
Albumin: 2.8 g/dL — ABNORMAL LOW (ref 3.5–5.0)
Alkaline Phosphatase: 149 U/L — ABNORMAL HIGH (ref 38–126)
Anion gap: 11 (ref 5–15)
BUN: 65 mg/dL — ABNORMAL HIGH (ref 6–20)
CO2: 19 mmol/L — ABNORMAL LOW (ref 22–32)
Calcium: 7.8 mg/dL — ABNORMAL LOW (ref 8.9–10.3)
Chloride: 99 mmol/L (ref 98–111)
Creatinine, Ser: 3.98 mg/dL — ABNORMAL HIGH (ref 0.61–1.24)
GFR, Estimated: 17 mL/min — ABNORMAL LOW (ref >60)
Glucose, Bld: 86 mg/dL (ref 70–99)
Potassium: 4.8 mmol/L (ref 3.5–5.1)
Sodium: 129 mmol/L — ABNORMAL LOW (ref 135–145)
Total Bilirubin: 0.9 mg/dL (ref <1.2)
Total Protein: 8.3 g/dL — ABNORMAL HIGH (ref 6.5–8.1)

## 2023-03-12 LAB — LIPID PANEL
Cholesterol: 138 mg/dL (ref 0–200)
HDL: 19 mg/dL — ABNORMAL LOW (ref >40)
LDL Cholesterol: 68 mg/dL (ref 0–99)
Total CHOL/HDL Ratio: 7.3 {ratio}
Triglycerides: 257 mg/dL — ABNORMAL HIGH (ref <150)
VLDL: 51 mg/dL — ABNORMAL HIGH (ref 0–40)

## 2023-03-12 LAB — HEPATITIS PANEL, ACUTE
HCV Ab: NONREACTIVE
Hep A IgM: NONREACTIVE
Hep B C IgM: NONREACTIVE
Hepatitis B Surface Ag: NONREACTIVE

## 2023-03-12 LAB — MAGNESIUM: Magnesium: 1.7 mg/dL (ref 1.7–2.4)

## 2023-03-12 LAB — LIPASE, BLOOD: Lipase: 107 U/L — ABNORMAL HIGH (ref 11–51)

## 2023-03-12 LAB — HIV ANTIBODY (ROUTINE TESTING W REFLEX): HIV Screen 4th Generation wRfx: NONREACTIVE

## 2023-03-12 MED ORDER — SODIUM CHLORIDE 0.9 % IV SOLN: INTRAVENOUS | Status: AC

## 2023-03-12 MED ORDER — METOPROLOL TARTRATE 25 MG PO TABS
25.0000 mg | ORAL_TABLET | Freq: Two times a day (BID) | ORAL | Status: DC
  Administered 2023-03-12 – 2023-03-14 (×5): 25 mg via ORAL
  Filled 2023-03-12 (×6): qty 1

## 2023-03-12 MED ORDER — PANTOPRAZOLE SODIUM 40 MG PO TBEC
40.0000 mg | DELAYED_RELEASE_TABLET | Freq: Every day | ORAL | Status: DC
  Administered 2023-03-12 – 2023-03-14 (×3): 40 mg via ORAL
  Filled 2023-03-12 (×4): qty 1

## 2023-03-12 MED ORDER — ACETAMINOPHEN 325 MG PO TABS
650.0000 mg | ORAL_TABLET | Freq: Four times a day (QID) | ORAL | Status: DC | PRN
Start: 2023-03-12 — End: 2023-03-12
  Administered 2023-03-12: 650 mg via ORAL
  Filled 2023-03-12: qty 2

## 2023-03-12 MED ORDER — SODIUM CHLORIDE 0.9 % IV SOLN: INTRAVENOUS | Status: DC

## 2023-03-12 MED ORDER — SPIRONOLACTONE 25 MG PO TABS
50.0000 mg | ORAL_TABLET | Freq: Once | ORAL | Status: AC
  Administered 2023-03-12: 50 mg via ORAL
  Filled 2023-03-12: qty 2

## 2023-03-12 MED ORDER — HYDROMORPHONE HCL 1 MG/ML IJ SOLN
1.0000 mg | INTRAMUSCULAR | Status: DC | PRN
  Administered 2023-03-12 – 2023-03-13 (×2): 1 mg via INTRAVENOUS
  Filled 2023-03-12 (×2): qty 1

## 2023-03-12 MED ORDER — MORPHINE SULFATE (PF) 2 MG/ML IV SOLN
2.0000 mg | INTRAVENOUS | Status: DC | PRN
  Administered 2023-03-12: 2 mg via INTRAVENOUS
  Filled 2023-03-12: qty 1

## 2023-03-12 MED ORDER — HEPARIN SODIUM (PORCINE) 5000 UNIT/ML IJ SOLN
5000.0000 [IU] | Freq: Three times a day (TID) | INTRAMUSCULAR | Status: DC
  Administered 2023-03-12 – 2023-03-14 (×7): 5000 [IU] via SUBCUTANEOUS
  Filled 2023-03-12 (×7): qty 1

## 2023-03-12 MED ORDER — SODIUM BICARBONATE 650 MG PO TABS
650.0000 mg | ORAL_TABLET | Freq: Three times a day (TID) | ORAL | Status: DC
  Administered 2023-03-12 – 2023-03-14 (×8): 650 mg via ORAL
  Filled 2023-03-12 (×9): qty 1

## 2023-03-12 MED ORDER — TAMSULOSIN HCL 0.4 MG PO CAPS
0.4000 mg | ORAL_CAPSULE | Freq: Every day | ORAL | Status: DC
  Administered 2023-03-12 – 2023-03-13 (×2): 0.4 mg via ORAL
  Filled 2023-03-12 (×2): qty 1

## 2023-03-12 MED ORDER — ONDANSETRON HCL 4 MG PO TABS: 4.0000 mg | ORAL_TABLET | Freq: Four times a day (QID) | ORAL | Status: DC | PRN

## 2023-03-12 MED ORDER — NICOTINE 14 MG/24HR TD PT24
14.0000 mg | MEDICATED_PATCH | Freq: Every day | TRANSDERMAL | Status: DC
  Administered 2023-03-12 – 2023-03-14 (×3): 14 mg via TRANSDERMAL
  Filled 2023-03-12 (×4): qty 1

## 2023-03-12 MED ORDER — ACETAMINOPHEN 650 MG RE SUPP
650.0000 mg | Freq: Four times a day (QID) | RECTAL | Status: DC | PRN
Start: 2023-03-12 — End: 2023-03-12

## 2023-03-12 MED ORDER — ONDANSETRON HCL 4 MG/2ML IJ SOLN: 4.0000 mg | Freq: Four times a day (QID) | INTRAMUSCULAR | Status: DC | PRN

## 2023-03-12 MED ORDER — OXYCODONE HCL 5 MG PO TABS
5.0000 mg | ORAL_TABLET | ORAL | Status: DC | PRN
  Administered 2023-03-12 – 2023-03-13 (×3): 5 mg via ORAL
  Filled 2023-03-12 (×3): qty 1

## 2023-03-12 MED ORDER — MOMETASONE FURO-FORMOTEROL FUM 200-5 MCG/ACT IN AERO
2.0000 | INHALATION_SPRAY | Freq: Two times a day (BID) | RESPIRATORY_TRACT | Status: DC
  Administered 2023-03-12 – 2023-03-13 (×4): 2 via RESPIRATORY_TRACT
  Filled 2023-03-12 (×2): qty 8.8

## 2023-03-12 NOTE — Assessment & Plan Note (Signed)
-   Denies any alcoholic drink for 2 weeks except for 1 beer yesterday - Denies any symptoms of withdrawal - Continue to monitor

## 2023-03-12 NOTE — Assessment & Plan Note (Signed)
-   Smokes half a pack per day - Trying to quit - Continue nicotine patch

## 2023-03-12 NOTE — Hospital Course (Addendum)
[name] [name] is a 57 y.o. male with medical history significant of cirrhosis, hyperlipidemia, hypertension, recent dog bite, GERD, and more presents the ED with a chief complaint of abdominal pain.  Patient reports that the whole right side of his abdomen hurts.  It started 1 week ago.  He was trying to wait until his appointment on Tuesday with GI, but he could not wait anymore.  He reports the pain was too much.  Positional changes make it worse.  Eating makes it worse.  Patient describes the pain as throbbing.  He has had nausea and vomiting associated 2-3 times per day.  He has had some diarrhea/loose stools.  He reports there is been no hematemesis, hematochezia, melena.  He took some Tylenol for the pain but it did not help.  Patient reports he did feel feverish at home with hot flashes and cold chills, and the Tylenol did help with that.  Patient denies history of pancreatitis even though it is listed in his chart.  He reports that he has a "bad gallbladder" but he was told that he cannot have it out because it is "too close to his liver."  Patient reports he has no history of high cholesterol even though he does.  Patient has little insight to his own health care.   Patient smokes half a pack per day.  He reports he drank 1 beer in the last 2 weeks, and has not been able to tolerate more than that because of his nausea and vomiting.  Usually drinks more.  The last time he was heavily drinking was 2 weeks ago.  He denies tremors, anxiety, pruritus, withdrawal symptoms.  Patient is full code.  ED: Patient was afebrile, normal heart rate, normal respiratory rate, blood pressure stable, satting 98-99% No leukocytosis, hemoglobin stable at 12.3 Chemistry shows a BUN of 67 and a creatinine of 4.45 Alk phos 145 Albumin 3.1 Lipase 176 AST 96, ALT 133, Trope normal x 2 CT abdomen shows pancreatitis Chest x-ray shows no active disease EKG shows a heart rate of 72, sinus rhythm, QTc 434 Patient was given  morphine, rabies vaccine, 1 L normal saline in the ED Admission requested for acute pancreatitis

## 2023-03-12 NOTE — Progress Notes (Signed)
Initial Nutrition Assessment  DOCUMENTATION CODES:   Severe malnutrition in context of acute illness/injury  INTERVENTION:   When diet advanced to at least clear liquids, add Boost Breeze po TID, each supplement provides 250 kcal and 9 grams of protein. When diet advanced passed clear liquids, can change supplement to Ensure Plus High Protein po TID, each supplement provides 350 kcal and 20 grams of protein.  NUTRITION DIAGNOSIS:   Severe Malnutrition related to acute illness (acute pancreatitis) as evidenced by moderate muscle depletion, percent weight loss (11% weight loss within the past month).  GOAL:   Patient will meet greater than or equal to 90% of their needs  MONITOR:   Diet advancement, PO intake  REASON FOR ASSESSMENT:   Malnutrition Screening Tool    ASSESSMENT:   58 yo male admitted with acute pancreatitis. PMH includes HTN, HLD, kdiney stones, asthma, cirrhosis, B12 deficiency, smoker.  Patient reports minimal intake at home for the past week or so d/t abdominal pain. He typically eats well and maintains his weight, but has lost a lot of weight recently d/t abdominal pain and decreased intake. He states he is hungry now, ready to eat something.    Labs reviewed. Na 129, BUN 65, creat 3.98, trig 257  Medications reviewed and include protonix, sodium bicarb tablet, flomax.  Weight history reviewed. Patient with 11% weight loss over the past month.   NUTRITION - FOCUSED PHYSICAL EXAM:  Flowsheet Row Most Recent Value  Orbital Region Mild depletion  Upper Arm Region No depletion  Thoracic and Lumbar Region No depletion  Buccal Region Mild depletion  Temple Region Mild depletion  Clavicle Bone Region Moderate depletion  Clavicle and Acromion Bone Region Moderate depletion  Scapular Bone Region Moderate depletion  Dorsal Hand Moderate depletion  Patellar Region Unable to assess  Anterior Thigh Region Unable to assess  Posterior Calf Region Mild depletion   Edema (RD Assessment) None  Hair Reviewed  Eyes Reviewed  Mouth Reviewed  Skin Reviewed  Nails Reviewed       Diet Order:   Diet Order             Diet NPO time specified Except for: Sips with Meds  Diet effective now                   EDUCATION NEEDS:   Not appropriate for education at this time  Skin:  Skin Assessment: Reviewed RN Assessment  Last BM:  11/11  Height:   Ht Readings from Last 1 Encounters:  03/12/23 6' (1.829 m)    Weight:   Wt Readings from Last 1 Encounters:  03/12/23 70.3 kg    Ideal Body Weight:  80.9 kg  BMI:  Body mass index is 21.02 kg/m.  Estimated Nutritional Needs:   Kcal:  2100-2300  Protein:  100-115 gm  Fluid:  2.1-2.3 L   Gabriel Rainwater RD, LDN, CNSC Please refer to Amion for contact information.

## 2023-03-12 NOTE — Progress Notes (Signed)
   03/12/23 0857  TOC Brief Assessment  Insurance and Status Reviewed  Patient has primary care physician Yes  Home environment has been reviewed from home  Prior level of function: independent  Prior/Current Home Services No current home services  Social Determinants of Health Reivew SDOH reviewed no interventions necessary  Readmission risk has been reviewed Yes  Transition of care needs no transition of care needs at this time    Transition of Care Department [employer] [employer]) has reviewed patient and no TOC needs have been identified at this time. We will continue to monitor patient advancement through interdisciplinary progression rounds. If new patient transition needs arise, please place a TOC consult.

## 2023-03-12 NOTE — Progress Notes (Signed)
PROGRESS NOTE    Patient: [name] [name]                            PCP: Benetta Spar, MD                    DOB: 09-10-1965            DOA: 03/11/2023 ZOX:[id number]             DOS: 03/12/2023, 11:17 AM   LOS: 0 days   Date of Service: The patient was seen and examined on 03/12/2023  Subjective:   The patient was seen and examined this morning. Hemodynamically stable. No issues overnight .  Brief Narrative:   [name] [name] is a 57 y.o. male with medical history significant of cirrhosis, hyperlipidemia, hypertension, recent dog bite, GERD, and more presents the ED with a chief complaint of abdominal pain.  Patient reports that the whole right side of his abdomen hurts.  It started 1 week ago.  He was trying to wait until his appointment on Tuesday with GI, but he could not wait anymore.  He reports the pain was too much.  Positional changes make it worse.  Eating makes it worse.  Patient describes the pain as throbbing.  He has had nausea and vomiting associated 2-3 times per day.  He has had some diarrhea/loose stools.  He reports there is been no hematemesis, hematochezia, melena.  He took some Tylenol for the pain but it did not help.  Patient reports he did feel feverish at home with hot flashes and cold chills, and the Tylenol did help with that.  Patient denies history of pancreatitis even though it is listed in his chart.  He reports that he has a "bad gallbladder" but he was told that he cannot have it out because it is "too close to his liver."  Patient reports he has no history of high cholesterol even though he does.  Patient has little insight to his own health care.   Patient smokes half a pack per day.  He reports he drank 1 beer in the last 2 weeks, and has not been able to tolerate more than that because of his nausea and vomiting.  Usually drinks more.  The last time he was heavily drinking was 2 weeks ago.  He denies tremors, anxiety, pruritus, withdrawal symptoms.   Patient is full code.  ED: Patient was afebrile, normal heart rate, normal respiratory rate, blood pressure stable, satting 98-99% No leukocytosis, hemoglobin stable at 12.3 Chemistry shows a BUN of 67 and a creatinine of 4.45 Alk phos 145 Albumin 3.1 Lipase 176 AST 96, ALT 133, Trope normal x 2 CT abdomen shows pancreatitis Chest x-ray shows no active disease EKG shows a heart rate of 72, sinus rhythm, QTc 434 Patient was given morphine, rabies vaccine, 1 L normal saline in the ED Admission requested for acute pancreatitis    Assessment & Plan:   Principal Problem:   Acute pancreatitis Active Problems:   Alcoholic cirrhosis of liver with ascites (HCC)   Hyperlipidemia   AKI (acute kidney injury) (HCC)   Tobacco use disorder   Dog bite     Assessment and Plan: * Acute pancreatitis -N.p.o., reporting improved abdominal pain, no nausea or vomiting this morning - Lipase 176 >>  - CT abdomen shows pancreatitis - Patient has a history of alcohol induced pancreatitis - Significantly decreased alcohol consumption  recently - Right upper quadrant ultrasound >>>   - Continue IV fluids - Continue pain control - Lipid panel - Continue to monitor  Alcoholic cirrhosis of liver with ascites (HCC) - Denies any alcoholic drink for 2 weeks except for 1 beer yesterday - Denies any symptoms of withdrawal - Continue to monitor Transaminitis:      Latest Ref Rng & Units 03/12/2023    6:31 AM 03/11/2023    9:22 PM 02/25/2023   12:12 AM  Hepatic Function  Total Protein 6.5 - 8.1 g/dL 8.3  8.8  8.5   Albumin 3.5 - 5.0 g/dL 2.8  3.1  3.4   AST 15 - 41 U/L 77  96  29   ALT 0 - 44 U/L 110  133  12   Alk Phosphatase 38 - 126 U/L 149  145  73   Total Bilirubin <1.2 mg/dL 0.9  0.7  0.4    -Checking hepatitis panel   Dog bite -Stable no signs of infection - Dog bite happened some weeks ago, patient recently had his third rabies vaccine last night - Wound is healing well - No  fevers, no drainage - Looks like he was treated with Augmentin - Continue to monitor  Tobacco use disorder - Smokes half a pack per day - Trying to quit - Continue nicotine patch  AKI (acute kidney injury) (HCC) - Creatinine up to 4.45 from 2.93 - Continue IV fluids - Likely related to poor p.o. intake given abdominal pain from acute pancreatitis - Trend in the a.m. Lab Results  Component Value Date   CREATININE 3.98 (H) 03/12/2023   CREATININE 4.45 (H) 03/11/2023   CREATININE 2.93 (H) 02/25/2023     Hyponatremia : Monitoring serum sodium level 132, 126, 129 this a.m. Continue IV fluid with normal saline   hyperlipidemia - Not currently on a statin likely because of cirrhosis - LDL 68, HDL 19 Triglyceride 257    ----------------------------------------------------------------------------------------------------------------------------------------------- Nutritional status:  The patient's BMI is: Body mass index is 21.02 kg/m. I agree with the assessment and plan as outlined ------------------------------------------------------------------------------------------------------------------------------------------------  DVT prophylaxis:  heparin injection 5,000 Units Start: 03/12/23 0600 SCDs Start: 03/12/23 0250   Code Status:   Code Status: Full Code  Family Communication: No family member present at bedside- attempt will be made to update daily  -Advance care planning has been discussed.   Admission status:   Status is: Observation The patient remains OBS appropriate and will d/c before 2 midnights.   Disposition: From  - home             Planning for discharge in 1-2 days: to   Procedures:   No admission procedures for hospital encounter.   Antimicrobials:  Anti-infectives (From admission, onward)    None        Medication:   heparin  5,000 Units Subcutaneous Q8H   metoprolol tartrate  25 mg Oral BID WC   mometasone-formoterol  2 puff  Inhalation BID   nicotine  14 mg Transdermal Daily   pantoprazole  40 mg Oral Daily   sodium bicarbonate  650 mg Oral TID   tamsulosin  0.4 mg Oral QPC supper    acetaminophen **OR** acetaminophen, morphine injection, ondansetron **OR** ondansetron (ZOFRAN) IV, oxyCODONE   Objective:   Vitals:   03/12/23 0104 03/12/23 0138 03/12/23 0515 03/12/23 0815  BP:  (!) 140/78 119/71 106/71  Pulse:  70 76 73  Resp:  20 18   Temp:  98.7 F (37.1 C)  98.5 F (36.9 C) 98.7 F (37.1 C)  TempSrc:  Oral Oral Oral  SpO2:  99% 100% 99%  Weight: 70.3 kg     Height:  6' (1.829 m)      Intake/Output Summary (Last 24 hours) at 03/12/2023 1117 Last data filed at 03/12/2023 0527 Gross per 24 hour  Intake 1001.5 ml  Output --  Net 1001.5 ml   Filed Weights   03/11/23 2040 03/12/23 0104  Weight: 79.4 kg 70.3 kg     Physical examination:   Constitution:  Alert, cooperative, no distress,  Appears calm and comfortable  Psychiatric:   Normal and stable mood and affect, cognition intact,   HEENT:        Normocephalic, PERRL, otherwise with in Normal limits  Chest:         Chest symmetric Cardio vascular:  S1/S2, RRR, No murmure, No Rubs or Gallops  pulmonary: Clear to auscultation bilaterally, respirations unlabored, negative wheezes / crackles Abdomen: Soft, non-tender, non-distended, bowel sounds,no masses, no organomegaly Muscular skeletal: Limited exam - in bed, able to move all 4 extremities,   Neuro: CNII-XII intact. , normal motor and sensation, reflexes intact  Extremities: No pitting edema lower extremities, +2 pulses  Skin: Dry, warm to touch, negative for any Rashes, No open wounds Wounds: per nursing documentation   ------------------------------------------------------------------------------------------------------------------------------------------    LABs:     Latest Ref Rng & Units 03/12/2023    6:31 AM 03/11/2023    9:22 PM 01/22/2023   11:53 AM  CBC  WBC 4.0 -  10.5 K/uL 9.0  9.5  7.4   Hemoglobin 13.0 - 17.0 g/dL 16.1  09.6  04.5   Hematocrit 39.0 - 52.0 % 32.1  36.8  38.4   Platelets 150 - 400 K/uL 351  395  385       Latest Ref Rng & Units 03/12/2023    6:31 AM 03/11/2023    9:22 PM 02/25/2023   12:12 AM  CMP  Glucose 70 - 99 mg/dL 86  90  89   BUN 6 - 20 mg/dL 65  67  43   Creatinine 0.61 - 1.24 mg/dL 4.09  8.11  9.14   Sodium 135 - 145 mmol/L 129  126  132   Potassium 3.5 - 5.1 mmol/L 4.8  4.7  4.9   Chloride 98 - 111 mmol/L 99  95  102   CO2 22 - 32 mmol/L 19  17  18    Calcium 8.9 - 10.3 mg/dL 7.8  8.0  8.6   Total Protein 6.5 - 8.1 g/dL 8.3  8.8  8.5   Total Bilirubin <1.2 mg/dL 0.9  0.7  0.4   Alkaline Phos 38 - 126 U/L 149  145  73   AST 15 - 41 U/L 77  96  29   ALT 0 - 44 U/L 110  133  12        Micro Results No results found for this or any previous visit (from the past 240 hour(s)).  Radiology Reports CT ABDOMEN PELVIS WO CONTRAST  Result Date: 03/11/2023 CLINICAL DATA:  Right-sided abdominal pain that started 1 week ago. Pain by dog on the right side. Wound to abdomen. EXAM: CT ABDOMEN AND PELVIS WITHOUT CONTRAST TECHNIQUE: Multidetector CT imaging of the abdomen and pelvis was performed following the standard protocol without IV contrast. RADIATION DOSE REDUCTION: This exam was performed according to the departmental dose-optimization program which includes automated exposure control, adjustment of the mA and/or kV  according to patient size and/or use of iterative reconstruction technique. COMPARISON:  02/25/2023 FINDINGS: Lower chest: No acute abnormality. Hepatobiliary: Cholelithiasis without evidence of acute cholecystitis. No biliary dilation. Mild nodularity of the hepatic contour suggesting cirrhosis. Pancreas: Fullness in the pancreatic head. Question of mild adjacent stranding. Correlate with lipase to exclude pancreatitis. Spleen: Unremarkable. Adrenals/Urinary Tract: Unremarkable adrenal glands. Atrophic left  kidney. No urinary calculi or hydronephrosis. Nondistended bladder. Stomach/Bowel: Normal caliber large and small bowel. Colonic diverticulosis without diverticulitis. Normal appendix. Stomach is within normal limits. Vascular/Lymphatic: Aortic atherosclerosis. No enlarged abdominal or pelvic lymph nodes. Reproductive: Unremarkable. Other: No free intraperitoneal fluid or air. Musculoskeletal: Mild subcutaneous fat stranding about the right flank (series 2/image 36), medial right buttock (2/86) and left anterior abdomen (2/48). No edema within the muscles deep to the stranding. No abscess. No soft tissue gas. No acute fracture. IMPRESSION: 1. Fullness in the pancreatic head with question of mild adjacent stranding. Correlate with lipase to exclude pancreatitis. 2. Mild subcutaneous fat stranding about the right flank, medial right buttock and left anterior abdomen. These likely represents improving contusions. 3. Cholelithiasis without evidence of acute cholecystitis. 4. Mild nodularity of the hepatic contour suggesting cirrhosis. Aortic Atherosclerosis (ICD10-I70.0). Electronically Signed   By: Minerva Fester M.D.   On: 03/11/2023 23:06   DG Chest Portable 1 View  Result Date: 03/11/2023 CLINICAL DATA:  Right-sided abdominal pain for 1 week. Bit by dog right side 2 weeks ago. EXAM: PORTABLE CHEST 1 VIEW COMPARISON:  05/19/2022 FINDINGS: The heart size and mediastinal contours are within normal limits. Both lungs are clear. The visualized skeletal structures are unremarkable. IMPRESSION: No active disease. Electronically Signed   By: Minerva Fester M.D.   On: 03/11/2023 22:18    SIGNED: Kendell Bane, MD, FHM. FAAFP. [facility] - [facility] Time spent - 35 min.  In seeing, evaluating and examining the patient. Reviewing medical records, labs, drawn plan of care. [facility],  Pager (please use [email or web address] to page/ text) Please use Epic Secure Chat for non-urgent communication  (7AM-7PM)  If 7PM-7AM, please contact night-coverage [email or web address], 03/12/2023, 11:17 AM

## 2023-03-12 NOTE — Assessment & Plan Note (Signed)
-   Creatinine up to 4.45 from 2.93 - Continue IV fluids - Likely related to poor p.o. intake given abdominal pain from acute pancreatitis - Trend in the a.m.

## 2023-03-12 NOTE — Assessment & Plan Note (Signed)
-   Not currently on a statin likely because of cirrhosis - With pancreatitis we will check a lipid panel

## 2023-03-12 NOTE — Assessment & Plan Note (Addendum)
-   Dog bite happened some weeks ago, patient recently had his third rabies vaccine last night - Wound is healing well - No fevers, no drainage - Looks like he was treated with Augmentin - Continue to monitor

## 2023-03-12 NOTE — Assessment & Plan Note (Signed)
-   Lipase 176 - CT abdomen shows pancreatitis - Patient has a history of alcohol induced pancreatitis - Significantly decreased alcohol consumption recently - Right upper quadrant ultrasound - N.p.o. - Continue IV fluids - Continue pain control - Lipid panel - Continue to monitor

## 2023-03-13 ENCOUNTER — Ambulatory Visit: Payer: Medicaid Other | Admitting: Gastroenterology

## 2023-03-13 DIAGNOSIS — N179 Acute kidney failure, unspecified: Secondary | ICD-10-CM | POA: Diagnosis not present

## 2023-03-13 DIAGNOSIS — R7401 Elevation of levels of liver transaminase levels: Secondary | ICD-10-CM | POA: Diagnosis not present

## 2023-03-13 DIAGNOSIS — K859 Acute pancreatitis without necrosis or infection, unspecified: Secondary | ICD-10-CM | POA: Diagnosis not present

## 2023-03-13 LAB — COMPREHENSIVE METABOLIC PANEL
ALT: 78 U/L — ABNORMAL HIGH (ref 0–44)
AST: 56 U/L — ABNORMAL HIGH (ref 15–41)
Albumin: 2.5 g/dL — ABNORMAL LOW (ref 3.5–5.0)
Alkaline Phosphatase: 135 U/L — ABNORMAL HIGH (ref 38–126)
Anion gap: 9 (ref 5–15)
BUN: 56 mg/dL — ABNORMAL HIGH (ref 6–20)
CO2: 19 mmol/L — ABNORMAL LOW (ref 22–32)
Calcium: 8 mg/dL — ABNORMAL LOW (ref 8.9–10.3)
Chloride: 104 mmol/L (ref 98–111)
Creatinine, Ser: 3.28 mg/dL — ABNORMAL HIGH (ref 0.61–1.24)
GFR, Estimated: 21 mL/min — ABNORMAL LOW (ref >60)
Glucose, Bld: 82 mg/dL (ref 70–99)
Potassium: 5 mmol/L (ref 3.5–5.1)
Sodium: 132 mmol/L — ABNORMAL LOW (ref 135–145)
Total Bilirubin: 0.6 mg/dL (ref <1.2)
Total Protein: 7.4 g/dL (ref 6.5–8.1)

## 2023-03-13 LAB — CBC
HCT: 30.4 % — ABNORMAL LOW (ref 39.0–52.0)
Hemoglobin: 10 g/dL — ABNORMAL LOW (ref 13.0–17.0)
MCH: 29.1 pg (ref 26.0–34.0)
MCHC: 32.9 g/dL (ref 30.0–36.0)
MCV: 88.4 fL (ref 80.0–100.0)
Platelets: 299 10*3/uL (ref 150–400)
RBC: 3.44 MIL/uL — ABNORMAL LOW (ref 4.22–5.81)
RDW: 16.3 % — ABNORMAL HIGH (ref 11.5–15.5)
WBC: 7.2 10*3/uL (ref 4.0–10.5)
nRBC: 0 % (ref 0.0–0.2)

## 2023-03-13 LAB — LIPASE, BLOOD: Lipase: 72 U/L — ABNORMAL HIGH (ref 11–51)

## 2023-03-13 MED ORDER — MOMETASONE FURO-FORMOTEROL FUM 200-5 MCG/ACT IN AERO
2.0000 | INHALATION_SPRAY | Freq: Two times a day (BID) | RESPIRATORY_TRACT | Status: DC
  Administered 2023-03-14: 2 via RESPIRATORY_TRACT

## 2023-03-13 MED ORDER — ENSURE ENLIVE PO LIQD
237.0000 mL | Freq: Three times a day (TID) | ORAL | Status: DC
  Administered 2023-03-13 (×3): 237 mL via ORAL

## 2023-03-13 MED ORDER — SODIUM CHLORIDE 0.9 % IV SOLN: INTRAVENOUS | Status: AC

## 2023-03-13 NOTE — TOC Initial Note (Signed)
Transition of Care [facility]) - Initial/Assessment Note    Patient Details  Name: [name] [name] MRN: [id number] Date of Birth: [birth year]  [facility]) CM/SW Contact:    Elliot Gault, LCSW Phone Number: 03/13/2023, 2:51 PM  Clinical Narrative:                 Pt admitted from home. Pt known to TOC from prrevious admissions. Assessment completed due to high risk readmission score. Pt lives with his girlfriend. He is independent with ADLs. Pt has transportation to appointments. Pt plans to return home when medically stable.   Pt has previously declined SA treatment resources when offered by TOC. Added this information to pt's AVS for his reference.  TOC will continue to follow.                 Expected Discharge Plan: Home/Self Care Barriers to Discharge: Continued Medical Work up   Patient Goals and CMS Choice Patient states their goals for this hospitalization and ongoing recovery are:: go home          Expected Discharge Plan and Services In-house Referral: Clinical Social Work     Living arrangements for the past 2 months: Single Family Home                                      Prior Living Arrangements/Services Living arrangements for the past 2 months: Single Family Home Lives with:: Significant Other Patient language and need for interpreter reviewed:: Yes Do you feel safe going back to the place where you live?: Yes      Need for Family Participation in Patient Care: No (Comment)     Criminal Activity/Legal Involvement Pertinent to Current Situation/Hospitalization: No - Comment as needed  Activities of Daily Living   ADL Screening (condition at time of admission) Independently performs ADLs?: Yes (appropriate for developmental age) Is the patient deaf or have difficulty hearing?: No Does the patient have difficulty seeing, even when wearing glasses/contacts?: No Does the patient have difficulty concentrating, remembering, or making  decisions?: No  Permission Sought/Granted                  Emotional Assessment       Orientation: : Oriented to Self, Oriented to Place, Oriented to  Time, Oriented to Situation Alcohol / Substance Use: Alcohol Use Psych Involvement: No (comment)  Admission diagnosis:  Acute pancreatitis [K85.90] Transaminitis [R74.01] AKI (acute kidney injury) (HCC) [N17.9] Right upper quadrant abdominal pain [R10.11] Alcohol-induced acute pancreatitis, unspecified complication status [K85.20] Patient Active Problem List   Diagnosis Date Noted   Dog bite 03/12/2023   Acute pancreatitis 03/11/2023   Metabolic acidosis 09/25/2022   Bladder outlet obstruction 09/25/2022   Acute pancreatitis without infection or necrosis 05/24/2022   Alcohol-induced acute pancreatitis 05/23/2022   Cholelithiasis 05/20/2022   Tobacco use disorder 05/20/2022   GERD (gastroesophageal reflux disease) 05/20/2022   B12 deficiency 03/15/2022   History of colonic polyps 03/09/2022   Gallbladder polyp 03/01/2022   Alcohol abuse 11/28/2021   Upper abdominal pain 08/11/2021   AKI (acute kidney injury) (HCC) 08/11/2021   Portal hypertension (HCC) 08/03/2021   Other ascites 08/03/2021   Moderate protein-calorie malnutrition (HCC) 08/03/2021   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   Elevated ferritin 08/26/2020   Hypomagnesemia 08/11/2020   Hypoalbuminemia 08/11/2020   Hypotension 08/11/2020   Anemia of chronic disease 08/11/2020  DVT (deep venous thrombosis) -Lt LL Extensive DVT 08/05/2020   Alcoholic cirrhosis of liver with ascites (HCC) 08/05/2020   COPD (chronic obstructive pulmonary disease) (HCC) 08/05/2020   Hypokalemia 08/05/2020   Cirrhosis of liver without ascites (HCC) 08/04/2020   Edema of left lower extremity 08/04/2020   Loss of weight 07/07/2020   Loss of appetite 07/07/2020   Anasarca 07/07/2020   Heme + stool 01/22/2017   Abnormal LFTs 01/22/2017   Hyperlipidemia  02/08/2015   Cigarette nicotine dependence with nicotine-induced disorder 02/08/2015   Leukocytosis 01/07/2013   Hyponatremia 01/07/2013   UTI (urinary tract infection) 01/06/2013   Kidney stone 01/06/2013   Ureteral colic 01/06/2013   PCP:  Benetta Spar, MD Pharmacy:   [facility] [phone] - [place], [place] - [address] [place] [address] Phone: [phone] Fax: [phone]     Social Determinants of Health (SDOH) Social History: SDOH Screenings   Food Insecurity: No Food Insecurity (03/12/2023)  Housing: Low Risk  (03/12/2023)  Transportation Needs: No Transportation Needs (03/12/2023)  Utilities: Not At Risk (03/12/2023)  Alcohol Screen: Low Risk  (09/13/2020)  Financial Resource Strain: High Risk (11/28/2021)   Received from [facility]  Physical Activity: Sufficiently Active (09/13/2020)  Social Connections: Moderately Isolated (09/13/2020)  Stress: No Stress Concern Present (09/13/2020)  Tobacco Use: High Risk (03/12/2023)   SDOH Interventions:     Readmission Risk Interventions    03/13/2023    2:50 PM 05/22/2022    9:08 AM  Readmission Risk Prevention Plan  Transportation Screening Complete Complete  HRI or Home Care Consult Complete Complete  Social Work Consult for Recovery Care Planning/Counseling Complete Complete  Palliative Care Screening Not Applicable Not Applicable  Medication Review Oceanographer) Complete Complete

## 2023-03-13 NOTE — Progress Notes (Signed)
PROGRESS NOTE    Patient: [name] [name]                            PCP: Benetta Spar, MD                    DOB: Oct 07, 1965            DOA: 03/11/2023 ZOX:[id number]             DOS: 03/13/2023, 11:26 AM   LOS: 1 day   Date of Service: The patient was seen and examined on 03/13/2023  Subjective:   The patient was seen and examined this morning, stable no acute distress -Tolerated clear liquid diet, asking if his diet can be advanced Complain generalized weaknesses Improved abdominal pain Denies any nausea vomiting  Brief Narrative:   CONLAN CLANIN is a 57 y.o. male with medical history significant of cirrhosis, hyperlipidemia, hypertension, recent dog bite, GERD, and more presents the ED with a chief complaint of abdominal pain.  Patient reports that the whole right side of his abdomen hurts.  It started 1 week ago.  He was trying to wait until his appointment on Tuesday with GI, but he could not wait anymore.  He reports the pain was too much.  Positional changes make it worse.  Eating makes it worse.  Patient describes the pain as throbbing.  He has had nausea and vomiting associated 2-3 times per day.  He has had some diarrhea/loose stools.  He reports there is been no hematemesis, hematochezia, melena.  He took some Tylenol for the pain but it did not help.  Patient reports he did feel feverish at home with hot flashes and cold chills, and the Tylenol did help with that.  Patient denies history of pancreatitis even though it is listed in his chart.  He reports that he has a "bad gallbladder" but he was told that he cannot have it out because it is "too close to his liver."  Patient reports he has no history of high cholesterol even though he does.  Patient has little insight to his own health care.   Patient smokes half a pack per day.  He reports he drank 1 beer in the last 2 weeks, and has not been able to tolerate more than that because of his nausea and vomiting.  Usually  drinks more.  The last time he was heavily drinking was 2 weeks ago.  He denies tremors, anxiety, pruritus, withdrawal symptoms.  Patient is full code.  ED: Patient was afebrile, normal heart rate, normal respiratory rate, blood pressure stable, satting 98-99% No leukocytosis, hemoglobin stable at 12.3 Chemistry shows a BUN of 67 and a creatinine of 4.45 Alk phos 145 Albumin 3.1 Lipase 176 AST 96, ALT 133, Trope normal x 2 CT abdomen shows pancreatitis Chest x-ray shows no active disease EKG shows a heart rate of 72, sinus rhythm, QTc 434 Patient was given morphine, rabies vaccine, 1 L normal saline in the ED Admission requested for acute pancreatitis    Assessment & Plan:   Principal Problem:   Acute pancreatitis Active Problems:   Alcoholic cirrhosis of liver with ascites (HCC)   Hyperlipidemia   AKI (acute kidney injury) (HCC)   Tobacco use disorder   Dog bite     Assessment and Plan: * Acute pancreatitis -Improved nausea vomiting, and abdominal pain -Started clear liquid diet, tolerating, advancing as tolerated - Lipase  176 >> 107>> 72 - CT abdomen shows pancreatitis - Patient has a history of alcohol induced pancreatitis - Significantly decreased alcohol consumption recently - Right upper quadrant ultrasound >>> positive cholelithiasis ??  Cholecystitis (Afebrile, no leukocytosis negative Murphy sign)  - Continue gentle IV fluid hydration, encouraging p.o. intake - Continue pain control - Lipid: LDL 68, triglyceride 257, - Continue to monitor  Alcoholic cirrhosis of liver with ascites (HCC) - Denies any alcoholic drink for 2 weeks except for 1 beer yesterday - Denies any symptoms of withdrawal - Continue to monitor Transaminitis:      Latest Ref Rng & Units 03/13/2023    5:35 AM 03/12/2023    6:31 AM 03/11/2023    9:22 PM  Hepatic Function  Total Protein 6.5 - 8.1 g/dL 7.4  8.3  8.8   Albumin 3.5 - 5.0 g/dL 2.5  2.8  3.1   AST 15 - 41 U/L 56  77  96    ALT 0 - 44 U/L 78  110  133   Alk Phosphatase 38 - 126 U/L 135  149  145   Total Bilirubin <1.2 mg/dL 0.6  0.9  0.7    -Checking hepatitis panel: Hepatitis -all nonreactive -HIV nonreactive   AKI (acute kidney injury) (HCC) - Creatinine up to 4.45 from 2.93 Improving - Continue IVF, encouraging oral intake - Likely related to poor p.o. intake given abdominal pain from acute pancreatitis - Trending Lab Results  Component Value Date   CREATININE 3.28 (H) 03/13/2023   CREATININE 3.98 (H) 03/12/2023   CREATININE 4.45 (H) 03/11/2023    Dog bite -Stable no signs of infection - Dog bite happened some weeks ago, patient recently had his third rabies vaccine last night - Wound is healing well - No fevers, no drainage - Completed course antibiotic-  Augmentin  Tobacco use disorder - Smokes half a pack per day - Trying to quit - Continue nicotine patch   Hyponatremia : Monitoring serum sodium level 132, 126, 129 this a.m. -Improved with IV fluid-  normal saline   Hyperlipidemia -hypertriglyceridemia - Not currently on a statin likely because of cirrhosis - LDL 68, HDL 19 Triglyceride 257 -Avoiding hepatotoxins at this point -due to acute transaminitis    ----------------------------------------------------------------------------------------------------------------------------------------------- Nutritional status:  The patient's BMI is: Body mass index is 21.02 kg/m. I agree with the assessment and plan as outlined ------------------------------------------------------------------------------------------------------------------------------------------------  DVT prophylaxis:  heparin injection 5,000 Units Start: 03/12/23 0600 SCDs Start: 03/12/23 0250   Code Status:   Code Status: Full Code  Family Communication: No family member present at bedside- attempt will be made to update daily  -Advance care planning has been discussed.   Admission status:   Status  is: Observation The patient remains OBS appropriate and will d/c before 2 midnights.   Disposition: From  - home             Planning for discharge in 1-2 days: to   Procedures:   No admission procedures for hospital encounter.   Antimicrobials:  Anti-infectives (From admission, onward)    None        Medication:   feeding supplement  237 mL Oral TID BM   heparin  5,000 Units Subcutaneous Q8H   metoprolol tartrate  25 mg Oral BID WC   mometasone-formoterol  2 puff Inhalation BID   nicotine  14 mg Transdermal Daily   pantoprazole  40 mg Oral Daily   sodium bicarbonate  650 mg Oral TID   tamsulosin  0.4 mg Oral QPC supper    HYDROmorphone (DILAUDID) injection, ondansetron **OR** ondansetron (ZOFRAN) IV, oxyCODONE   Objective:   Vitals:   03/12/23 1304 03/12/23 1938 03/13/23 0322 03/13/23 1116  BP: 103/61 102/67 98/62 106/60  Pulse: 62 64 66 80  Resp:    16  Temp: 98.7 F (37.1 C) 98.1 F (36.7 C) 98.7 F (37.1 C) 98 F (36.7 C)  TempSrc: Oral Oral Oral Oral  SpO2: 100% 100% 99% 99%  Weight:      Height:        Intake/Output Summary (Last 24 hours) at 03/13/2023 1126 Last data filed at 03/13/2023 0813 Gross per 24 hour  Intake 1147.85 ml  Output --  Net 1147.85 ml   Filed Weights   03/11/23 2040 03/12/23 0104  Weight: 79.4 kg 70.3 kg     Physical examination:        General:  AAO x 3,  cooperative, no distress;   HEENT:  Normocephalic, PERRL, otherwise with in Normal limits   Neuro:  CNII-XII intact. , normal motor and sensation, reflexes intact   Lungs:   Clear to auscultation BL, Respirations unlabored,  No wheezes / crackles  Cardio:    S1/S2, RRR, No murmure, No Rubs or Gallops   Abdomen:  Soft, non-tender, bowel sounds active all four quadrants, no guarding or peritoneal signs.  Muscular  skeletal:  Limited exam -global generalized weaknesses - in bed, able to move all 4 extremities,   2+ pulses,  symmetric, No pitting edema  Skin:   Dry, warm to touch, negative for any Rashes,  Wounds: Please see nursing documentation         ------------------------------------------------------------------------------------------------------------------------------------------    LABs:     Latest Ref Rng & Units 03/13/2023    5:35 AM 03/12/2023    6:31 AM 03/11/2023    9:22 PM  CBC  WBC 4.0 - 10.5 K/uL 7.2  9.0  9.5   Hemoglobin 13.0 - 17.0 g/dL 60.4  54.0  98.1   Hematocrit 39.0 - 52.0 % 30.4  32.1  36.8   Platelets 150 - 400 K/uL 299  351  395       Latest Ref Rng & Units 03/13/2023    5:35 AM 03/12/2023    6:31 AM 03/11/2023    9:22 PM  CMP  Glucose 70 - 99 mg/dL 82  86  90   BUN 6 - 20 mg/dL 56  65  67   Creatinine 0.61 - 1.24 mg/dL 1.91  4.78  2.95   Sodium 135 - 145 mmol/L 132  129  126   Potassium 3.5 - 5.1 mmol/L 5.0  4.8  4.7   Chloride 98 - 111 mmol/L 104  99  95   CO2 22 - 32 mmol/L 19  19  17    Calcium 8.9 - 10.3 mg/dL 8.0  7.8  8.0   Total Protein 6.5 - 8.1 g/dL 7.4  8.3  8.8   Total Bilirubin <1.2 mg/dL 0.6  0.9  0.7   Alkaline Phos 38 - 126 U/L 135  149  145   AST 15 - 41 U/L 56  77  96   ALT 0 - 44 U/L 78  110  133        Micro Results No results found for this or any previous visit (from the past 240 hour(s)).  Radiology Reports No results found.  SIGNED: Kendell Bane, MD, FHM. FAAFP. [facility] - [facility] Time spent -  35 min.  In seeing, evaluating and examining the patient. Reviewing medical records, labs, drawn plan of care. [facility],  Pager (please use [email or web address] to page/ text) Please use Epic Secure Chat for non-urgent communication (7AM-7PM)  If 7PM-7AM, please contact night-coverage [email or web address], 03/13/2023, 11:26 AM

## 2023-03-14 DIAGNOSIS — K859 Acute pancreatitis without necrosis or infection, unspecified: Secondary | ICD-10-CM | POA: Diagnosis not present

## 2023-03-14 LAB — COMPREHENSIVE METABOLIC PANEL
ALT: 71 U/L — ABNORMAL HIGH (ref 0–44)
AST: 55 U/L — ABNORMAL HIGH (ref 15–41)
Albumin: 2.7 g/dL — ABNORMAL LOW (ref 3.5–5.0)
Alkaline Phosphatase: 168 U/L — ABNORMAL HIGH (ref 38–126)
Anion gap: 3 — ABNORMAL LOW (ref 5–15)
BUN: 42 mg/dL — ABNORMAL HIGH (ref 6–20)
CO2: 19 mmol/L — ABNORMAL LOW (ref 22–32)
Calcium: 7.6 mg/dL — ABNORMAL LOW (ref 8.9–10.3)
Chloride: 108 mmol/L (ref 98–111)
Creatinine, Ser: 2.92 mg/dL — ABNORMAL HIGH (ref 0.61–1.24)
GFR, Estimated: 24 mL/min — ABNORMAL LOW (ref >60)
Glucose, Bld: 126 mg/dL — ABNORMAL HIGH (ref 70–99)
Potassium: 4.6 mmol/L (ref 3.5–5.1)
Sodium: 130 mmol/L — ABNORMAL LOW (ref 135–145)
Total Bilirubin: 0.6 mg/dL (ref <1.2)
Total Protein: 7.4 g/dL (ref 6.5–8.1)

## 2023-03-14 LAB — CBC
HCT: 32.1 % — ABNORMAL LOW (ref 39.0–52.0)
Hemoglobin: 10.3 g/dL — ABNORMAL LOW (ref 13.0–17.0)
MCH: 29.2 pg (ref 26.0–34.0)
MCHC: 32.1 g/dL (ref 30.0–36.0)
MCV: 90.9 fL (ref 80.0–100.0)
Platelets: 278 10*3/uL (ref 150–400)
RBC: 3.53 MIL/uL — ABNORMAL LOW (ref 4.22–5.81)
RDW: 17 % — ABNORMAL HIGH (ref 11.5–15.5)
WBC: 5.9 10*3/uL (ref 4.0–10.5)
nRBC: 0 % (ref 0.0–0.2)

## 2023-03-14 LAB — LIPASE, BLOOD: Lipase: 132 U/L — ABNORMAL HIGH (ref 11–51)

## 2023-03-14 MED ORDER — ONDANSETRON HCL 4 MG PO TABS: 4.0000 mg | ORAL_TABLET | Freq: Every day | ORAL | 1 refills | Status: DC | PRN

## 2023-03-14 MED ORDER — ENSURE ENLIVE PO LIQD: 237.0000 mL | Freq: Three times a day (TID) | ORAL | 12 refills | Status: DC

## 2023-03-14 NOTE — Discharge Summary (Signed)
Physician Discharge Summary  [name] [name] XLK:[id number] DOB: 01/06/1966 DOA: 03/11/2023  PCP: Benetta Spar, MD  Admit date: 03/11/2023  Discharge date: 03/14/2023  Admitted From:Home  Disposition:  Home  Recommendations for Outpatient Follow-up:  Follow up with PCP in 1-2 weeks Zofran prescribed as needed for nausea or vomiting Follow-up labs with PCP to ensure alkaline phosphatase and lipase levels have returned to normal limits in the next 1-2 weeks Continue other home medications as prior Encouraged alcohol cessation  Home Health: None  Equipment/Devices: None  Discharge Condition:Stable  CODE STATUS: Full  Diet recommendation: Heart Healthy  Brief/Interim Summary: [name] [name] is a 57 y.o. male with medical history significant of cirrhosis, hyperlipidemia, hypertension, recent dog bite, GERD, and more presents the ED with a chief complaint of abdominal pain.  He was diagnosed with acute pancreatitis that appears to be related to possible alcohol use.  No signs of significant cholecystitis noted on right upper quadrant ultrasound and lipid panel within normal limits.  He was also noted to have AKI which has now resolved to near his normal baseline of creatinine 2.5.  He is tolerating diet with no further abdominal symptoms of nausea or vomiting or pain.  He is in stable condition for discharge and will follow up with PCP.  Discharge Diagnoses:  Principal Problem:   Acute pancreatitis Active Problems:   Alcoholic cirrhosis of liver with ascites (HCC)   Hyperlipidemia   AKI (acute kidney injury) (HCC)   Tobacco use disorder   Dog bite  Principal discharge diagnosis: Acute pancreatitis likely in the setting of alcohol use-resolved.  Discharge Instructions  Discharge Instructions     Diet - low sodium heart healthy   Complete by: As directed    Increase activity slowly   Complete by: As directed       Allergies as of 03/14/2023   No Known Allergies       Medication List     STOP taking these medications    amoxicillin-clavulanate 500-125 MG tablet Commonly known as: Augmentin       TAKE these medications    albuterol 108 (90 Base) MCG/ACT inhaler Commonly known as: Proventil HFA INHALE 2 PUFFS BY MOUTH EVERY 6 HOURS AS NEEDED FOR COUGHING, WHEEZING, OR SHORTNESS OF BREATH What changed:  how much to take how to take this when to take this reasons to take this additional instructions   aspirin EC 81 MG tablet Take 81 mg by mouth daily.   cyanocobalamin 1000 MCG/ML injection Commonly known as: VITAMIN B12 Inject 1,000 mcg into the muscle every 30 (thirty) days.   Dulera 200-5 MCG/ACT Aero Generic drug: mometasone-formoterol Inhale 2 puffs into the lungs 2 (two) times daily.   feeding supplement Liqd Take 237 mLs by mouth 3 (three) times daily between meals.   furosemide 20 MG tablet Commonly known as: LASIX TAKE 2 TABLETS BY MOUTH EVERY MORNING AND 1 TABLET IN THE AFTERNOON What changed: See the new instructions.   metoprolol tartrate 25 MG tablet Commonly known as: LOPRESSOR Take 25 mg by mouth 2 (two) times daily.   ondansetron 4 MG tablet Commonly known as: Zofran Take 1 tablet (4 mg total) by mouth daily as needed for nausea or vomiting.   pantoprazole 40 MG tablet Commonly known as: PROTONIX TAKE 1 TABLET(40 MG) BY MOUTH DAILY What changed: See the new instructions.   pyridOXINE 25 MG tablet Commonly known as: VITAMIN B6 Take 4 tablets (100 mg total) by mouth daily for 21  days, THEN 1 tablet (25 mg total) daily. Start taking on: October 06, 2022   sodium bicarbonate 650 MG tablet Take 1 tablet (650 mg total) by mouth 3 (three) times daily.   spironolactone 50 MG tablet Commonly known as: ALDACTONE TAKE 2 TABLETS BY MOUTH EVERY MORNING AND 1 TABLET DAILY IN THE AFTERNOON What changed: See the new instructions.   tamsulosin 0.4 MG Caps capsule Commonly known as: FLOMAX Take 1 capsule (0.4 mg  total) by mouth daily after supper.        Follow-up Information     Fanta, Wayland Salinas, MD. Schedule an appointment as soon as possible for a visit in 1 week(s).   Specialty: Internal Medicine Contact information: [address] [place] [address] [phone]                No Known Allergies  Consultations: None   Procedures/Studies: US Abdomen Limited RUQ (LIVER/GB)  Result Date: 03/12/2023 CLINICAL DATA:  Acute pancreatitis EXAM: ULTRASOUND ABDOMEN LIMITED RIGHT UPPER QUADRANT COMPARISON:  None Available. FINDINGS: Gallbladder: Gallbladder wall thickening, measuring up to 3.9 mm. Multiple small layering gallstones. Pericholecystic fluid. Gallbladder polyp measuring 4 mm. No sonographic Murphy sign noted by sonographer. Common bile duct: Diameter: 5.6 mm Liver: No focal lesion identified. Nodular liver contour. Normal parenchymal echogenicity. Portal vein is patent on color Doppler imaging with normal direction of blood flow towards the liver. Other: None. IMPRESSION: 1. Cholelithiasis with mild gallbladder wall thickening and pericholecystic fluid, findings are concerning for acute cholecystitis, although sonographic Murphy sign is negative. Recommend clinical correlation. 2. Nodular liver contour, suggestive of cirrhosis. 3. Gallbladder polyp measuring 4 mm, given small size, no specific follow-up imaging is necessary. Electronically Signed   By: Allegra Lai M.D.   On: 03/12/2023 13:14   CT ABDOMEN PELVIS WO CONTRAST  Result Date: 03/11/2023 CLINICAL DATA:  Right-sided abdominal pain that started 1 week ago. Pain by dog on the right side. Wound to abdomen. EXAM: CT ABDOMEN AND PELVIS WITHOUT CONTRAST TECHNIQUE: Multidetector CT imaging of the abdomen and pelvis was performed following the standard protocol without IV contrast. RADIATION DOSE REDUCTION: This exam was performed according to the departmental dose-optimization program which includes  automated exposure control, adjustment of the mA and/or kV according to patient size and/or use of iterative reconstruction technique. COMPARISON:  02/25/2023 FINDINGS: Lower chest: No acute abnormality. Hepatobiliary: Cholelithiasis without evidence of acute cholecystitis. No biliary dilation. Mild nodularity of the hepatic contour suggesting cirrhosis. Pancreas: Fullness in the pancreatic head. Question of mild adjacent stranding. Correlate with lipase to exclude pancreatitis. Spleen: Unremarkable. Adrenals/Urinary Tract: Unremarkable adrenal glands. Atrophic left kidney. No urinary calculi or hydronephrosis. Nondistended bladder. Stomach/Bowel: Normal caliber large and small bowel. Colonic diverticulosis without diverticulitis. Normal appendix. Stomach is within normal limits. Vascular/Lymphatic: Aortic atherosclerosis. No enlarged abdominal or pelvic lymph nodes. Reproductive: Unremarkable. Other: No free intraperitoneal fluid or air. Musculoskeletal: Mild subcutaneous fat stranding about the right flank (series 2/image 36), medial right buttock (2/86) and left anterior abdomen (2/48). No edema within the muscles deep to the stranding. No abscess. No soft tissue gas. No acute fracture. IMPRESSION: 1. Fullness in the pancreatic head with question of mild adjacent stranding. Correlate with lipase to exclude pancreatitis. 2. Mild subcutaneous fat stranding about the right flank, medial right buttock and left anterior abdomen. These likely represents improving contusions. 3. Cholelithiasis without evidence of acute cholecystitis. 4. Mild nodularity of the hepatic contour suggesting cirrhosis. Aortic Atherosclerosis (ICD10-I70.0). Electronically Signed   By: Joselyn Glassman  Stutzman M.D.   On: 03/11/2023 23:06   DG Chest Portable 1 View  Result Date: 03/11/2023 CLINICAL DATA:  Right-sided abdominal pain for 1 week. Bit by dog right side 2 weeks ago. EXAM: PORTABLE CHEST 1 VIEW COMPARISON:  05/19/2022 FINDINGS: The heart  size and mediastinal contours are within normal limits. Both lungs are clear. The visualized skeletal structures are unremarkable. IMPRESSION: No active disease. Electronically Signed   By: Minerva Fester M.D.   On: 03/11/2023 22:18   CT ABDOMEN PELVIS WO CONTRAST  Result Date: 02/25/2023 CLINICAL DATA:  Animal bite, fall, laceration, penetrating abdominal trauma EXAM: CT ABDOMEN AND PELVIS WITHOUT CONTRAST TECHNIQUE: Multidetector CT imaging of the abdomen and pelvis was performed following the standard protocol without IV contrast. RADIATION DOSE REDUCTION: This exam was performed according to the departmental dose-optimization program which includes automated exposure control, adjustment of the mA and/or kV according to patient size and/or use of iterative reconstruction technique. COMPARISON:  CT 09/23/2022 FINDINGS: Lower chest: No acute abnormality. Hepatobiliary: Mild nodularity of the hepatic contour compatible with cirrhosis. Cholelithiasis without evidence of acute cholecystitis. No biliary dilation. Pancreas: Unremarkable. Spleen: Unremarkable. Adrenals/Urinary Tract: Normal adrenal glands. Atrophic left kidney. Cortical renal scarring right kidney. No urinary calculi or hydronephrosis. Bladder is unremarkable. Stomach/Bowel: Normal caliber large and small bowel. No bowel wall thickening. Colonic diverticulosis without diverticulitis. The appendix is not visualized.Stomach is within normal limits. Vascular/Lymphatic: Aortic atherosclerosis. No enlarged abdominal or pelvic lymph nodes. Reproductive: Unremarkable. Other: No free intraperitoneal fluid or air. Laceration and contusion about the subcutaneous fat of the right flank (series 2/image 34). Contusion and small hematoma in the anterior left subcutaneous fat of the lower abdomen. Contusion and hematomas in the posteromedial right gluteal soft tissues and anterior right thigh. Musculoskeletal: No acute fracture. IMPRESSION: 1. Laceration and  contusion about the subcutaneous fat of the right flank. 2. Contusion and small hematomas in the left lower abdomen, anterior right thigh, and posteromedial right buttock. 3. No acute intra-abdominal process. 4. Cholelithiasis without evidence of acute cholecystitis. 5. Cirrhosis. Aortic Atherosclerosis (ICD10-I70.0). Electronically Signed   By: Minerva Fester M.D.   On: 02/25/2023 00:17   CT HEAD WO CONTRAST  Result Date: 02/25/2023 CLINICAL DATA:  Trauma EXAM: CT HEAD WITHOUT CONTRAST TECHNIQUE: Contiguous axial images were obtained from the base of the skull through the vertex without intravenous contrast. RADIATION DOSE REDUCTION: This exam was performed according to the departmental dose-optimization program which includes automated exposure control, adjustment of the mA and/or kV according to patient size and/or use of iterative reconstruction technique. COMPARISON:  None Available. FINDINGS: Brain: No evidence of acute infarction, hemorrhage, hydrocephalus, extra-axial collection or mass lesion/mass effect. There is mild diffuse atrophy. There is mild patchy periventricular and deep white matter hypodensity, likely chronic small vessel ischemic change. Vascular: Atherosclerotic calcifications are present within the cavernous internal carotid arteries. Skull: Normal. Negative for fracture or focal lesion. Sinuses/Orbits: No acute finding. Other: None. IMPRESSION: 1. No acute intracranial process. 2. Mild diffuse atrophy and chronic small vessel ischemic change. Electronically Signed   By: Darliss Cheney M.D.   On: 02/25/2023 00:12   DG Wrist Complete Right  Result Date: 02/25/2023 CLINICAL DATA:  Dog bite EXAM: RIGHT WRIST - COMPLETE 3+ VIEW COMPARISON:  None Available. FINDINGS: There is soft tissue swelling and air surrounding the distal forearm and wrist compatible with patient's history of dog bite. On the lateral view there are few punctate densities in the region of soft tissue swelling  anterior to  the wrist joint. Foreign bodies are not excluded. There are findings suspicious for subtle nondisplaced fracture of the distal ulna seen only on the oblique view. No dislocation. IMPRESSION: 1. Soft tissue swelling and air surrounding the distal forearm and wrist compatible with patient's history of dog bite. 2. Few punctate densities in the region of soft tissue swelling anterior to the wrist joint. Foreign bodies are not excluded. 3. Findings suspicious for subtle nondisplaced fracture of the distal ulna seen only on the oblique view. Electronically Signed   By: Darliss Cheney M.D.   On: 02/25/2023 00:01     Discharge Exam: Vitals:   03/14/23 0802 03/14/23 0804  BP:  115/64  Pulse:  81  Resp:  18  Temp:  99.1 F (37.3 C)  SpO2: 100% 99%   Vitals:   03/13/23 1958 03/14/23 0400 03/14/23 0802 03/14/23 0804  BP:  (!) 123/58  115/64  Pulse:  78  81  Resp:  18  18  Temp:  99 F (37.2 C)  99.1 F (37.3 C)  TempSrc:  Oral  Oral  SpO2: 97% 100% 100% 99%  Weight:      Height:        General: Pt is alert, awake, not in acute distress Cardiovascular: RRR, S1/S2 +, no rubs, no gallops Respiratory: CTA bilaterally, no wheezing, no rhonchi Abdominal: Soft, NT, ND, bowel sounds + Extremities: no edema, no cyanosis    The results of significant diagnostics from this hospitalization (including imaging, microbiology, ancillary and laboratory) are listed below for reference.     Microbiology: No results found for this or any previous visit (from the past 240 hour(s)).   Labs: BNP (last 3 results) No results for input(s): "BNP" in the last 8760 hours. Basic Metabolic Panel: Recent Labs  Lab 03/11/23 2122 03/12/23 0631 03/13/23 0535 03/14/23 0439  NA 126* 129* 132* 130*  K 4.7 4.8 5.0 4.6  CL 95* 99 104 108  CO2 17* 19* 19* 19*  GLUCOSE 90 86 82 126*  BUN 67* 65* 56* 42*  CREATININE 4.45* 3.98* 3.28* 2.92*  CALCIUM 8.0* 7.8* 8.0* 7.6*  MG  --  1.7  --   --    Liver  Function Tests: Recent Labs  Lab 03/11/23 2122 03/12/23 0631 03/13/23 0535 03/14/23 0439  AST 96* 77* 56* 55*  ALT 133* 110* 78* 71*  ALKPHOS 145* 149* 135* 168*  BILITOT 0.7 0.9 0.6 0.6  PROT 8.8* 8.3* 7.4 7.4  ALBUMIN 3.1* 2.8* 2.5* 2.7*   Recent Labs  Lab 03/11/23 2122 03/12/23 0631 03/13/23 0535 03/14/23 0439  LIPASE 176* 107* 72* 132*   No results for input(s): "AMMONIA" in the last 168 hours. CBC: Recent Labs  Lab 03/11/23 2122 03/12/23 0631 03/13/23 0535 03/14/23 0439  WBC 9.5 9.0 7.2 5.9  NEUTROABS 6.1 5.3  --   --   HGB 12.3* 10.9* 10.0* 10.3*  HCT 36.8* 32.1* 30.4* 32.1*  MCV 87.4 86.8 88.4 90.9  PLT 395 351 299 278   Cardiac Enzymes: No results for input(s): "CKTOTAL", "CKMB", "CKMBINDEX", "TROPONINI" in the last 168 hours. BNP: Invalid input(s): "POCBNP" CBG: No results for input(s): "GLUCAP" in the last 168 hours. D-Dimer No results for input(s): "DDIMER" in the last 72 hours. Hgb A1c No results for input(s): "HGBA1C" in the last 72 hours. Lipid Profile Recent Labs    03/12/23 0631  CHOL 138  HDL 19*  LDLCALC 68  TRIG 528*  CHOLHDL 7.3   Thyroid function studies No  results for input(s): "TSH", "T4TOTAL", "T3FREE", "THYROIDAB" in the last 72 hours.  Invalid input(s): "FREET3" Anemia work up No results for input(s): "VITAMINB12", "FOLATE", "FERRITIN", "TIBC", "IRON", "RETICCTPCT" in the last 72 hours. Urinalysis    Component Value Date/Time   COLORURINE YELLOW 03/11/2023 2215   APPEARANCEUR CLEAR 03/11/2023 2215   APPEARANCEUR Clear 12/28/2022 1041   LABSPEC 1.013 03/11/2023 2215   PHURINE 5.0 03/11/2023 2215   GLUCOSEU NEGATIVE 03/11/2023 2215   HGBUR NEGATIVE 03/11/2023 2215   BILIRUBINUR NEGATIVE 03/11/2023 2215   BILIRUBINUR Negative 12/28/2022 1041   KETONESUR NEGATIVE 03/11/2023 2215   PROTEINUR 100 (A) 03/11/2023 2215   UROBILINOGEN 0.2 11/02/2013 0816   NITRITE NEGATIVE 03/11/2023 2215   LEUKOCYTESUR NEGATIVE 03/11/2023  2215   Sepsis Labs Recent Labs  Lab 03/11/23 2122 03/12/23 0631 03/13/23 0535 03/14/23 0439  WBC 9.5 9.0 7.2 5.9   Microbiology No results found for this or any previous visit (from the past 240 hour(s)).   Time coordinating discharge: 35 minutes  SIGNED:   Erick Blinks, DO [facility] 03/14/2023, 9:35 AM  If 7PM-7AM, please contact night-coverage [email or web address]

## 2023-03-19 ENCOUNTER — Inpatient Hospital Stay: Payer: Medicaid Other | Attending: Physician Assistant

## 2023-03-19 DIAGNOSIS — R7989 Other specified abnormal findings of blood chemistry: Secondary | ICD-10-CM | POA: Insufficient documentation

## 2023-03-19 DIAGNOSIS — F1021 Alcohol dependence, in remission: Secondary | ICD-10-CM | POA: Insufficient documentation

## 2023-03-19 DIAGNOSIS — Z86718 Personal history of other venous thrombosis and embolism: Secondary | ICD-10-CM | POA: Diagnosis not present

## 2023-03-19 DIAGNOSIS — R7402 Elevation of levels of lactic acid dehydrogenase (LDH): Secondary | ICD-10-CM | POA: Insufficient documentation

## 2023-03-19 DIAGNOSIS — R768 Other specified abnormal immunological findings in serum: Secondary | ICD-10-CM | POA: Insufficient documentation

## 2023-03-19 DIAGNOSIS — Z800 Family history of malignant neoplasm of digestive organs: Secondary | ICD-10-CM | POA: Insufficient documentation

## 2023-03-19 DIAGNOSIS — D631 Anemia in chronic kidney disease: Secondary | ICD-10-CM | POA: Diagnosis not present

## 2023-03-19 DIAGNOSIS — N1832 Chronic kidney disease, stage 3b: Secondary | ICD-10-CM

## 2023-03-19 DIAGNOSIS — E538 Deficiency of other specified B group vitamins: Secondary | ICD-10-CM | POA: Diagnosis present

## 2023-03-19 DIAGNOSIS — N184 Chronic kidney disease, stage 4 (severe): Secondary | ICD-10-CM | POA: Insufficient documentation

## 2023-03-19 DIAGNOSIS — E531 Pyridoxine deficiency: Secondary | ICD-10-CM | POA: Diagnosis not present

## 2023-03-19 DIAGNOSIS — G629 Polyneuropathy, unspecified: Secondary | ICD-10-CM | POA: Diagnosis not present

## 2023-03-19 DIAGNOSIS — F17210 Nicotine dependence, cigarettes, uncomplicated: Secondary | ICD-10-CM | POA: Diagnosis not present

## 2023-03-19 DIAGNOSIS — J449 Chronic obstructive pulmonary disease, unspecified: Secondary | ICD-10-CM | POA: Diagnosis not present

## 2023-03-19 LAB — CBC WITH DIFFERENTIAL/PLATELET
Abs Immature Granulocytes: 0.07 10*3/uL (ref 0.00–0.07)
Basophils Absolute: 0.1 10*3/uL (ref 0.0–0.1)
Basophils Relative: 1 %
Eosinophils Absolute: 0.1 10*3/uL (ref 0.0–0.5)
Eosinophils Relative: 1 %
HCT: 31.8 % — ABNORMAL LOW (ref 39.0–52.0)
Hemoglobin: 10.1 g/dL — ABNORMAL LOW (ref 13.0–17.0)
Immature Granulocytes: 1 %
Lymphocytes Relative: 12 %
Lymphs Abs: 1.3 10*3/uL (ref 0.7–4.0)
MCH: 29.3 pg (ref 26.0–34.0)
MCHC: 31.8 g/dL (ref 30.0–36.0)
MCV: 92.2 fL (ref 80.0–100.0)
Monocytes Absolute: 1.9 10*3/uL — ABNORMAL HIGH (ref 0.1–1.0)
Monocytes Relative: 17 %
Neutro Abs: 7.5 10*3/uL (ref 1.7–7.7)
Neutrophils Relative %: 68 %
Platelets: 487 10*3/uL — ABNORMAL HIGH (ref 150–400)
RBC: 3.45 MIL/uL — ABNORMAL LOW (ref 4.22–5.81)
RDW: 17.2 % — ABNORMAL HIGH (ref 11.5–15.5)
WBC: 11 10*3/uL — ABNORMAL HIGH (ref 4.0–10.5)
nRBC: 0 % (ref 0.0–0.2)

## 2023-03-19 LAB — IRON AND TIBC
Iron: 27 ug/dL — ABNORMAL LOW (ref 45–182)
Saturation Ratios: 15 % — ABNORMAL LOW (ref 17.9–39.5)
TIBC: 175 ug/dL — ABNORMAL LOW (ref 250–450)
UIBC: 148 ug/dL

## 2023-03-19 LAB — COMPREHENSIVE METABOLIC PANEL
ALT: 32 U/L (ref 0–44)
AST: 29 U/L (ref 15–41)
Albumin: 2.5 g/dL — ABNORMAL LOW (ref 3.5–5.0)
Alkaline Phosphatase: 135 U/L — ABNORMAL HIGH (ref 38–126)
Anion gap: 8 (ref 5–15)
BUN: 43 mg/dL — ABNORMAL HIGH (ref 6–20)
CO2: 20 mmol/L — ABNORMAL LOW (ref 22–32)
Calcium: 7.9 mg/dL — ABNORMAL LOW (ref 8.9–10.3)
Chloride: 104 mmol/L (ref 98–111)
Creatinine, Ser: 2.89 mg/dL — ABNORMAL HIGH (ref 0.61–1.24)
GFR, Estimated: 25 mL/min — ABNORMAL LOW (ref >60)
Glucose, Bld: 106 mg/dL — ABNORMAL HIGH (ref 70–99)
Potassium: 4.6 mmol/L (ref 3.5–5.1)
Sodium: 132 mmol/L — ABNORMAL LOW (ref 135–145)
Total Bilirubin: 0.6 mg/dL (ref <1.2)
Total Protein: 8.2 g/dL — ABNORMAL HIGH (ref 6.5–8.1)

## 2023-03-19 LAB — VITAMIN B12: Vitamin B-12: 1053 pg/mL — ABNORMAL HIGH (ref 180–914)

## 2023-03-19 LAB — FERRITIN: Ferritin: 561 ng/mL — ABNORMAL HIGH (ref 24–336)

## 2023-03-19 LAB — FOLATE: Folate: 6.3 ng/mL (ref >5.9)

## 2023-03-20 LAB — HOMOCYSTEINE: Homocysteine: 13.4 umol/L (ref 0.0–14.5)

## 2023-03-21 LAB — VITAMIN B6: Vitamin B6: 24.8 ug/L (ref 3.4–65.2)

## 2023-03-22 ENCOUNTER — Ambulatory Visit: Payer: Medicaid Other | Admitting: Gastroenterology

## 2023-03-22 ENCOUNTER — Encounter: Payer: Self-pay | Admitting: Gastroenterology

## 2023-03-22 VITALS — BP 114/70 | HR 82 | Temp 98.7°F | Ht 72.0 in | Wt 170.4 lb

## 2023-03-22 DIAGNOSIS — K7030 Alcoholic cirrhosis of liver without ascites: Secondary | ICD-10-CM | POA: Diagnosis not present

## 2023-03-22 DIAGNOSIS — K858 Other acute pancreatitis without necrosis or infection: Secondary | ICD-10-CM

## 2023-03-22 DIAGNOSIS — F1090 Alcohol use, unspecified, uncomplicated: Secondary | ICD-10-CM

## 2023-03-22 DIAGNOSIS — Z8719 Personal history of other diseases of the digestive system: Secondary | ICD-10-CM | POA: Diagnosis not present

## 2023-03-22 DIAGNOSIS — K219 Gastro-esophageal reflux disease without esophagitis: Secondary | ICD-10-CM

## 2023-03-22 LAB — METHYLMALONIC ACID, SERUM: Methylmalonic Acid, Quantitative: 428 nmol/L — ABNORMAL HIGH (ref 0–378)

## 2023-03-22 NOTE — Progress Notes (Signed)
Gastroenterology Office Note     Primary Care Physician:  [name] Spar, MD  Primary Gastroenterologist: Dr. Jena [name]    Chief Complaint   Chief Complaint  Patient presents with   Follow-up    Having pain in chest     History of Present Illness   [name] [name] is a 57 y.o. male presenting today with a history of cirrhosis due to ETOH, followed by Liver clinic in [place] as well [name] Major, FNP). EGD May 2023 with normal esophagus but retained food in stomach and duodenum precluded exam, pancreatitis due to alcohol and gallstones Jan 2024, history of gallbladder polyp.    Repeat MRI pancreas April 2024 with resolution of pancreatitis.  Hepatic and splenic iron deposition noted. He has chronically elevated ferritin and iron sats previously felt to be due to liver disease and alcohol use. 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 Hematology is aware and following as well with plans for additional imaging if persistently elevated indices. Consider chelating agent if moderate to severe iron deposition. Most recent iron studies with ferritin elevated again at 561 (previously had normalized), and iron sats low. Hematology seeing on 11/25th.   Fullness in pancreas head on CT without contrast Mar 11, 2023. Lipase was 132 at that time. Acutely elevated transaminases with mildly elevated alk phos while inpatient mid November with concerns for acute pancreatitis. He notes drinking prior to this. No alcohol since hospitalization (about 3 weeks). He is not forthcoming with amount of alcohol previously drinking daily.   Upper abdominal pain onset last night. Feels like getting worse. Upper abdomen feels tight. Some shortness of breath. Then points to chest and says moves from one side to the other. When asked again, states it is upper abdomen and not chest. Sometimes hurts to take in a deep breath. No fever/chills. No nausea. No diaphoresis. No  radiating down arms. Hurts in middle of back.   Cirrhosis related questions: Episodes of confusion/disorientation: No Taking diuretics? Lasix 40 mg in am and 20 mg in pm, aldactone 100 mg in morning and 50 mg in pm  Beta blockers? No Prior history of variceal banding? no Prior episodes of SBP? no Last liver imaging: Nov 2024: cirrhosis, cholelithiasis with mild gallbladder wall thickening, gallbladder polyp 4mm.  Alcohol use:  PEth markedly positive in Aug 2024. Ethanol level positive 02/25/23. States no etoh in 3 weeks.     EGD May 2023: normal esophagus but retained food in stomach and duodenum precluded exam. Surveillance 2025   Colonoscopy Dec 2023: pancolonic diverticulosis, multiple polyps, one which was 11 mm (tubular adenomas). Surveillance in 3 years.   Past Medical History:  Diagnosis Date   Asthma    B12 deficiency 03/15/2022   Cirrhosis (HCC)    Dyspnea    High cholesterol    History of kidney stones    Hypertension    Kidney stones     Past Surgical History:  Procedure Laterality Date   APPENDECTOMY     BIOPSY  07/22/2020   Procedure: BIOPSY;  Surgeon: [name] Ade, MD;  Location: [facility];  Service: Endoscopy;;   COLONOSCOPY WITH PROPOFOL N/A 04/18/2017   non-bleeding internal hemorrhoids, two 4-6 mm polyps in descending colon and cecum, pancolonic diverticulosis, single cecal AVM. Tubular adenomas, surveillance in 2023.    COLONOSCOPY WITH PROPOFOL N/A 04/13/2022   pancolonic diverticulosis, multiple polyps, one which was 11 mm (tubular adenomas). Surveillance in 3 years.   ESOPHAGOGASTRODUODENOSCOPY (EGD) WITH  PROPOFOL N/A 07/22/2020   normal esophagus, small hiatal hernia, abnormal gastric mucosa, abnormal appearing ampula and periampullary mucosa. Mild chronic gastritis.   ESOPHAGOGASTRODUODENOSCOPY (EGD) WITH PROPOFOL N/A 09/08/2021   normal esophagus, retained food in stomach and duodenum precluded complete examination.   FRACTURE SURGERY      left arm   IR PARACENTESIS  05/31/2021   LOWER EXTREMITY VENOGRAPHY N/A 08/10/2020   Procedure: LOWER EXTREMITY VENOGRAPHY;  Surgeon: [name] Harman, MD;  Location: [facility] INVASIVE CV LAB;  Service: Cardiovascular;  Laterality: N/A;   PERIPHERAL VASCULAR BALLOON ANGIOPLASTY Left 08/10/2020   Procedure: PERIPHERAL VASCULAR BALLOON ANGIOPLASTY;  Surgeon: [name] Harman, MD;  Location: [facility] INVASIVE CV LAB;  Service: Cardiovascular;  Laterality: Left;  lower extremity venous   PERIPHERAL VASCULAR THROMBECTOMY N/A 08/10/2020   Procedure: PERIPHERAL VASCULAR THROMBECTOMY;  Surgeon: [name] Harman, MD;  Location: [facility] INVASIVE CV LAB;  Service: Cardiovascular;  Laterality: N/A;   POLYPECTOMY  04/18/2017   Procedure: POLYPECTOMY;  Surgeon: [name] Ade, MD;  [facility];  Service: Endoscopy;;   POLYPECTOMY  04/13/2022   Procedure: POLYPECTOMY;  Surgeon: [name] Ade, MD;  Location: [facility];  Service: Endoscopy;;    Current Outpatient Medications  Medication Sig Dispense Refill   albuterol (PROVENTIL HFA) 108 (90 Base) MCG/ACT inhaler INHALE 2 PUFFS BY MOUTH EVERY 6 HOURS AS NEEDED FOR COUGHING, WHEEZING, OR SHORTNESS OF BREATH (Patient taking differently: Inhale 2 puffs into the lungs every 6 (six) hours as needed for shortness of breath or wheezing.) 18 g 1   aspirin EC 81 MG tablet Take 81 mg by mouth daily.     cyanocobalamin (VITAMIN B12) 1000 MCG/ML injection Inject 1,000 mcg into the muscle every 30 (thirty) days.     feeding supplement (ENSURE ENLIVE / ENSURE PLUS) LIQD Take 237 mLs by mouth 3 (three) times daily between meals. 237 mL 12   furosemide (LASIX) 20 MG tablet TAKE 2 TABLETS BY MOUTH EVERY MORNING AND 1 TABLET IN THE AFTERNOON (Patient taking differently: Take 20 mg by mouth See admin instructions. Take 40 mg in the morning and 20 mg every afternoon) 90 tablet 3   metoprolol tartrate (LOPRESSOR) 25 MG tablet Take 25 mg by mouth 2 (two)  times daily.     mometasone-formoterol (DULERA) 200-5 MCG/ACT AERO Inhale 2 puffs into the lungs 2 (two) times daily. 13 g 3   ondansetron (ZOFRAN) 4 MG tablet Take 1 tablet (4 mg total) by mouth daily as needed for nausea or vomiting. 30 tablet 1   pantoprazole (PROTONIX) 40 MG tablet TAKE 1 TABLET(40 MG) BY MOUTH DAILY (Patient taking differently: Take 40 mg by mouth daily.) 90 tablet 3   pyridOXINE (VITAMIN B6) 25 MG tablet Take 4 tablets (100 mg total) by mouth daily for 21 days, THEN 1 tablet (25 mg total) daily. 264 tablet 0   sodium bicarbonate 650 MG tablet Take 1 tablet (650 mg total) by mouth 3 (three) times daily. 90 tablet 1   spironolactone (ALDACTONE) 50 MG tablet TAKE 2 TABLETS BY MOUTH EVERY MORNING AND 1 TABLET DAILY IN THE AFTERNOON (Patient taking differently: Take 50 mg by mouth See admin instructions. Take 100 mg in the morning and 50 mg in the afternoon) 180 tablet 2   tamsulosin (FLOMAX) 0.4 MG CAPS capsule Take 1 capsule (0.4 mg total) by mouth daily after supper. 30 capsule 2   No current facility-administered medications for this visit.    Allergies as of 03/22/2023   (  No Known Allergies)    Family History  Problem Relation Age of Onset   Cancer Father        throat   Diabetes Sister    Colon cancer Neg Hx     Social History   Socioeconomic History   Marital status: Single    Spouse name: Not on file   Number of children: Not on file   Years of education: Not on file   Highest education level: Not on file  Occupational History   Not on file  Tobacco Use   Smoking status: Every Day    Current packs/day: 0.50    Average packs/day: 0.5 packs/day for 33.0 years (16.5 ttl pk-yrs)    Types: Cigarettes    Passive exposure: Current   Smokeless tobacco: Never  Vaping Use   Vaping status: Never Used  Substance and Sexual Activity   Alcohol use: Not Currently    Comment: last deink 1 week ago   Drug use: No   Sexual activity: Yes    Birth  control/protection: None  Other Topics Concern   Not on file  Social History Narrative   Not on file   Social Determinants of Health   Financial Resource Strain: High Risk (11/28/2021)   [facility]   Overall Financial Resource Strain (CARDIA)    Difficulty of Paying Living Expenses: Very hard  Food Insecurity: No Food Insecurity (03/12/2023)   Hunger Vital Sign    Worried About Running Out of Food in the Last Year: Never true    Ran Out of Food in the Last Year: Never true  Transportation Needs: No Transportation Needs (03/12/2023)   PRAPARE - Administrator, Civil Service (Medical): No    Lack of Transportation (Non-Medical): No  Physical Activity: Sufficiently Active (09/13/2020)   Exercise Vital Sign    Days of Exercise per Week: 7 days    Minutes of Exercise per Session: 30 min  Stress: No Stress Concern Present (09/13/2020)   [employer] of Occupational Health - Occupational Stress Questionnaire    Feeling of Stress : Only a little  Social Connections: Moderately Isolated (09/13/2020)   Social Connection and Isolation Panel [NHANES]    Frequency of Communication with Friends and Family: More than three times a week    Frequency of Social Gatherings with Friends and Family: Once a week    Attends Religious Services: More than 4 times per year    Active Member of [employer] or Organizations: No    Attends Banker Meetings: Never    Marital Status: Never married  Intimate Partner Violence: Not At Risk (03/12/2023)   Humiliation, Afraid, Rape, and Kick questionnaire    Fear of Current or Ex-Partner: No    Emotionally Abused: No    Physically Abused: No    Sexually Abused: No     Review of Systems   Gen: Denies any fever, chills, fatigue, weight loss, lack of appetite.  CV: Denies chest pain, heart palpitations, peripheral edema, syncope.  Resp: Denies shortness of breath at rest or with exertion. Denies wheezing or  cough.  GI: Denies dysphagia or odynophagia. Denies jaundice, hematemesis, fecal incontinence. GU : Denies urinary burning, urinary frequency, urinary hesitancy MS: Denies joint pain, muscle weakness, cramps, or limitation of movement.  Derm: Denies rash, itching, dry skin Psych: Denies depression, anxiety, memory loss, and confusion Heme: Denies bruising, bleeding, and enlarged lymph nodes.   Physical Exam   BP 114/70 (BP  Location: Right Arm, Patient Position: Sitting, Cuff Size: Normal)   Pulse 82   Temp 98.7 F (37.1 C) (Oral)   Ht 6' (1.829 m)   Wt 170 lb 6.4 oz (77.3 kg)   SpO2 97%   BMI 23.11 kg/m  General:   Alert and oriented. Pleasant and cooperative. Well-nourished and well-developed.  Head:  Normocephalic and atraumatic. Eyes:  Without icterus Abdomen:  +BS, soft, non-tender and non-distended. No HSM noted. No guarding or rebound. No masses appreciated.  Rectal:  Deferred  Msk:  Symmetrical without gross deformities. Normal posture. Extremities:  Without edema. Neurologic:  Alert and  oriented x4;  grossly normal neurologically. Skin:  Intact without significant lesions or rashes. Psych:  Alert and cooperative. Normal mood and affect.   Assessment   [name] [name] is a 57 y.o. male presenting today with a history of cirrhosis due to ETOH, prior pancreatitis suspected due to alcohol +/- gallstones, gallbladder polyp, now with acute on chronic abdominal pian.   Suspect may have recurrent pancreatitis in light of recent drinking early November. Recent hospitalization early Nov 2024. I have ordered labs today but he is unable to go until next week. He is aware to go to the ED if anything worsens in interim. No signs of ascites on exam.   Cirrhosis: needs to strictly abstain from ETOH. Unfortunately, he has resumed this intermittently. No changes in diuretic therapy. EGD surveillance in May 2025.    PLAN    Stat labs ordered MR liver/pancreas with/without contrast in  December for re-evaluation of pancreas Strict abstinence from alcohol Liquids for now until pain improves then slowly advance diet Recommended ED initially but patient states unable to go. He will present if any worsening PPI BID for now.  6 week follow-up   Gelene Mink, PhD, [facility] Gastroenterology

## 2023-03-22 NOTE — Patient Instructions (Signed)
It is very important to avoid alcohol completely.   We have ordered blood work for when you are able to complete this.  Please go to the emergency room or call [phone] if pain worsens or any chest pain.   We will order special imaging of your pancreas to be done in about 4 weeks.   Follow liquids for now until your pain improves, then you can slowly advance to a soft diet.   I have increased pantoprazole to twice a day for now.  We will see you in 6 weeks!  I enjoyed seeing you again today! I value our relationship and want to provide genuine, compassionate, and quality care. You may receive a survey regarding your visit with me, and I welcome your feedback! Thanks so much for taking the time to complete this. I look forward to seeing you again.      Gelene Mink, PhD, ANP-BC [facility] Gastroenterology

## 2023-03-23 ENCOUNTER — Inpatient Hospital Stay: Payer: Medicaid Other

## 2023-03-25 NOTE — Progress Notes (Unsigned)
VIRTUAL VISIT via TELEPHONE NOTE [facility]   I connected with [name] [name]  on 03/26/23 at 10:20 AM by telephone and verified that I am speaking with the correct person using two identifiers.  Location: Patient: Home Provider: [name]   I discussed the limitations, risks, security and privacy concerns of performing an evaluation and management service by telephone and the availability of in person appointments. I also discussed with the patient that there may be a patient responsible charge related to this service. The patient expressed understanding and agreed to proceed.   REASON FOR VISIT:  Follow-up for elevated ferritin (H63D heterozygous) and normocytic anemia   PRIOR THERAPY: None   CURRENT THERAPY: B12 injections  INTERVAL HISTORY:   [name] [name] 57 y.o. male returns for routine follow-up of elevated ferritin and normocytic anemia.  He was last seen by [name] Brenner PA-C on 09/29/2022.     Since his last visit, he was hospitalized from 03/11/2023 through 03/14/2023 due to acute pancreatitis and AKI on CKD stage IIIb.  At today's visit, he reports feeling fair apart from ongoing fatigue.  He denies any abdominal pain too.  No abnormal joint pain or skin changes.    He reports some relapse to alcohol consumption, and that he was drinking about 80 oz of beer daily for several weeks prior to his hospitalization for pancreatitis.  He denies any alcohol consumption since hospitalization. He denies any excessive iron intake such as iron supplementation, organ meats, or large amounts of red meat.    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  He has bilateral leg swelling, but denies any unilateral leg swelling or other signs or symptoms of current DVT or PE.    He continues to have some peripheral neuropathy and paresthesia despite vitamin B6 supplementation, although his symptoms did improve.   He  has 25% energy and 100% appetite.  His weight fluctuates related to fluid overload and intermittent paracentesis, but overall is stable.  REVIEW OF SYSTEMS:   Review of Systems  Constitutional:  Positive for malaise/fatigue. Negative for chills, diaphoresis, fever and weight loss.  Respiratory:  Negative for cough and shortness of breath.   Cardiovascular:  Negative for chest pain and palpitations.  Gastrointestinal:  Negative for abdominal pain, blood in stool, melena, nausea and vomiting.  Neurological:  Positive for tingling. Negative for dizziness and headaches.     PHYSICAL EXAM: (per limitations of virtual telephone visit)  The patient is alert and oriented x 3, exhibiting adequate mentation, good mood, and ability to speak in full sentences and execute sound judgement.  ASSESSMENT & PLAN:  1.  Elevated ferritin with hemosiderosis, H63D heterozygous - Previously diagnosed with alcoholic liver cirrhosis, and was found to have elevated ferritin 1,254 on 07/20/2020 (normal ferritin noted in 2018). - Patient will intermittently stop alcohol consumption with sporadic relapses. Reports drinking about 80 ounces of beer daily for several weeks prior to hospitalization in November 2024. - Genetic testing showed patient to be H63D heterozygote for hemochromatosis gene - MRI abdomen (06/29/2021): Hepatic parenchymal signal suggestive of hemosiderosis - MRI abdomen (07/31/2022): Hepatic and splenic iron deposition. - Iron trends: Elevated ferritin since March 2022, with ferritin as high as 1477 (05/22/2022) Elevated iron saturation since at least 2018, as high as 98% saturation (08/06/2020) - Iron panel from May 2024 showed normalized ferritin 217 with iron saturation 15%.  Most recent labs (03/19/2023) show ferritin  back up to 561 with iron saturation 15%.  (Likely related to alcohol consumption and recent episode of pancreatitis)  - Patient follows with [name]) as  well as [name] [name] ([facility]) - Discussed extensively with [name] [name] (supervising physician): Elevated ferritin thought most likely to be due to liver disease rather than H63D heterozygosity, since alcoholic liver disease is associated with increased iron deposition in the liver.  Additionally, H63D heterozygosity is unlikely to cause increased ferritin that would be high enough to cause primary liver damage.  Patient is not felt to be a candidate for phlebotomy due to his anemia.  No indication for chelating agents with his current ferritin < 1,000.  However, it is somewhat difficult to determine whether elevated ferritin caused liver cirrhosis versus liver cirrhosis causing elevated ferritin.  If liver biopsy were to show worsening cirrhosis definitively caused by increased iron deposition in hepatocytes, we would certainly consider chelation therapy. - PLAN:  Elevated ferritin likely due to liver disease more so than H63D heterozygosity.  Since previous imaging showed hepatosplenic iron deposition, and since ferritin is trending upward again, recommend MRI liver to quantify iron deposition (per [name] [name]) Per [name] [name], consider chelating agent if moderate to severe iron deposition, in order to minimize further liver damage.  (Patient would need MD visit prior to starting chelating therapy.) - Further plan TBD pending results of MRI liver - we will tentatively plan RTC in 3 months - Patient counseled to continue to abstain from alcohol and to avoid excessive dietary iron.    2. Left lower extremity DVT - Patient being followed by vascular team for extensive left lower extremity DVT diagnosed 08/04/2020 - beginning at the level of the left external iliac vein origin and extending into the common femoral, superficial femoral, and deep femoral veins to the level of the popliteal vein - Ultrasound-guided LLE thrombectomy performed by Dr. Lemar [name] of vascular surgery - Was on  Eliquis since 08/04/2020 through 02/10/2021. - Eliquis discontinued on 02/10/2021 due to anemia with concern for possible GI bleed, was placed on aspirin instead by the recommendation of Dr. Randie Heinz - PLAN: Continue aspirin and follow-up as directed by vascular team.   3. Normocytic anemia secondary to CKD - Intermittent anemia with Hgb ranging from 7.5 hospitalization in October 2022) to normal over the past 12 months - Hematology work-up: SPEP and IFE showed polyclonal increase in immunoglobulins, and increased kappa/lambda light chains with normal ratio.  No M spike or monoclonal protein apparent. Reticulocytes and erythropoietin normal.  LDH mildly elevated.  Bilirubin normal. B12 and folic acid deficiency (see below) CKD stage IIIb/IV. - EGD (07/22/2020) with normal esophagus, small hiatal hernia, abnormal gastric mucosa, abnormal appearing ampula and periampullary mucosa. Mild chronic gastritis. - EGD (09/08/2021): Limited due to retained food in stomach/duodenum, but normal esophagus without any varices per report - Colonoscopy (04/13/2022): Diverticulosis, polyps x 9 (tubular adenoma) - Most recent CBC (03/19/2023): Hgb 10.1/MCV 92.2.  No iron deficiency.  B12 and folate normalized (see below).  CKD stage IV per CMP. - He denies any overt signs of bleeding - He is symptomatic with mild chronic fatigue - DIFFERENTIAL DIAGNOSIS favors anemia of chronic disease/CKD IIIb, further complicated by B12 and folic acid deficiency as below - PLAN: Continue B12 and folic acid supplementation as below.  Otherwise, no indication for treatment at this time, but we will continue to monitor with repeat labs and RTC in 3 months. - We will consider initiation of  ESA if Hgb persistently < 10.0. - Continue nephrology follow-up with Dr. Bufford Buttner   4.  Folate & vitamin B12 deficiency - Labs from November 2023 showed B12 deficiency (B12 138, MMA 447) and folate deficiency (folate 4.4, homocystine 42.2). -  Patient has been taking folic acid 400 mcg daily since November 2023 - Receiving monthly Vitamin B12 injections since November 2023  - Most recent labs (03/19/2023): B12 1053, MMA elevated at 428.  Folate 6.3, homocystine normalized at 13.4. - Suspect malabsorption in the setting of chronic alcoholism. - PLAN: Continue folic acid 400 mcg daily. - Continue monthly B12 injections.   - We will recheck B12 and folic acid levels in about 6 months.     5.  Vitamin B6 deficiency - At office visit in May 2024, patient reported worsening numbness and tingling in his hands and feet - Denies any history of diabetes - SPEP/immunofixation checked in 2023 was negative for monoclonal gammopathy - Labs showed vitamin B6 deficiency at 2.6.  (Vitamin B1 and vitamin D were normal) - He has been taking vitamin B6 supplementation since May 2024 - Reports neuropathy has been mildly improved after starting vitamin B6, but he does continue to have numbness and tingling in his hands - PLAN: Continue vitamin B6 (pyridoxine) 25 mg daily.  Recheck in 6 months. - We will refer to neurology for further workup of peripheral neuropathy   6.  Other history - His PMH is also notable for COPD in addition to alcoholic liver cirrhosis and LLE DVT noted above. -The patient is a recovering alcoholic - previously drank 12 beers per day for 20 + years, significantly cut back in February 2022, but continues to drink on occasion - He is an everyday tobacco user, smoking 0.5 PPD for the past 30 years.   - He denies illicit drug use.   - He is on disability and lives at home with his fianc. - No family history of liver disease or blood clots.  Father had throat cancer secondary to smoking.  No family hemochromatosis history.       PLAN SUMMARY: >> Please enter referral to neurology for "peripheral neuropathy" >> Schedule for MRI liver >> Continue monthly B12 injections  >> Labs in 3 months = CBC/D, CMP, ferritin, iron/TIBC >>  OFFICE visit in 3 months (1 week after labs)     I discussed the assessment and treatment plan with the patient. The patient was provided an opportunity to ask questions and all were answered. The patient agreed with the plan and demonstrated an understanding of the instructions.   The patient was advised to call back or seek an in-person evaluation if the symptoms worsen or if the condition fails to improve as anticipated.  I provided 22 minutes of non-face-to-face time during this encounter.  Carnella Guadalajara, PA-C 03/26/23 5:58 PM

## 2023-03-26 ENCOUNTER — Inpatient Hospital Stay: Payer: Medicaid Other | Admitting: Physician Assistant

## 2023-03-26 ENCOUNTER — Other Ambulatory Visit (HOSPITAL_COMMUNITY)
Admission: RE | Admit: 2023-03-26 | Discharge: 2023-03-26 | Disposition: A | Discharge status: Home / Self Care | Payer: Medicaid Other | Source: Ambulatory Visit | Attending: Gastroenterology | Admitting: Gastroenterology

## 2023-03-26 ENCOUNTER — Inpatient Hospital Stay: Payer: Medicaid Other

## 2023-03-26 ENCOUNTER — Encounter: Payer: Self-pay | Admitting: Physician Assistant

## 2023-03-26 VITALS — BP 116/66 | HR 86 | Temp 96.8°F | Resp 18

## 2023-03-26 DIAGNOSIS — E531 Pyridoxine deficiency: Secondary | ICD-10-CM

## 2023-03-26 DIAGNOSIS — K7030 Alcoholic cirrhosis of liver without ascites: Secondary | ICD-10-CM | POA: Insufficient documentation

## 2023-03-26 DIAGNOSIS — E538 Deficiency of other specified B group vitamins: Secondary | ICD-10-CM

## 2023-03-26 DIAGNOSIS — G63 Polyneuropathy in diseases classified elsewhere: Secondary | ICD-10-CM

## 2023-03-26 DIAGNOSIS — R7989 Other specified abnormal findings of blood chemistry: Secondary | ICD-10-CM | POA: Diagnosis not present

## 2023-03-26 DIAGNOSIS — N1832 Chronic kidney disease, stage 3b: Secondary | ICD-10-CM

## 2023-03-26 DIAGNOSIS — D631 Anemia in chronic kidney disease: Secondary | ICD-10-CM

## 2023-03-26 LAB — CBC WITH DIFFERENTIAL/PLATELET
Abs Immature Granulocytes: 0.02 10*3/uL (ref 0.00–0.07)
Basophils Absolute: 0.1 10*3/uL (ref 0.0–0.1)
Basophils Relative: 1 %
Eosinophils Absolute: 0.2 10*3/uL (ref 0.0–0.5)
Eosinophils Relative: 3 %
HCT: 28.9 % — ABNORMAL LOW (ref 39.0–52.0)
Hemoglobin: 9.4 g/dL — ABNORMAL LOW (ref 13.0–17.0)
Immature Granulocytes: 0 %
Lymphocytes Relative: 18 %
Lymphs Abs: 1.3 10*3/uL (ref 0.7–4.0)
MCH: 29.7 pg (ref 26.0–34.0)
MCHC: 32.5 g/dL (ref 30.0–36.0)
MCV: 91.2 fL (ref 80.0–100.0)
Monocytes Absolute: 0.9 10*3/uL (ref 0.1–1.0)
Monocytes Relative: 12 %
Neutro Abs: 4.9 10*3/uL (ref 1.7–7.7)
Neutrophils Relative %: 66 %
Platelets: 470 10*3/uL — ABNORMAL HIGH (ref 150–400)
RBC: 3.17 MIL/uL — ABNORMAL LOW (ref 4.22–5.81)
RDW: 16.3 % — ABNORMAL HIGH (ref 11.5–15.5)
WBC: 7.4 10*3/uL (ref 4.0–10.5)
nRBC: 0 % (ref 0.0–0.2)

## 2023-03-26 LAB — LIPASE, BLOOD: Lipase: 67 U/L — ABNORMAL HIGH (ref 11–51)

## 2023-03-26 LAB — PROTIME-INR
INR: 1.1 (ref 0.8–1.2)
Prothrombin Time: 14.7 s (ref 11.4–15.2)

## 2023-03-26 LAB — COMPREHENSIVE METABOLIC PANEL
ALT: 27 U/L (ref 0–44)
AST: 38 U/L (ref 15–41)
Albumin: 2.6 g/dL — ABNORMAL LOW (ref 3.5–5.0)
Alkaline Phosphatase: 160 U/L — ABNORMAL HIGH (ref 38–126)
Anion gap: 7 (ref 5–15)
BUN: 49 mg/dL — ABNORMAL HIGH (ref 6–20)
CO2: 21 mmol/L — ABNORMAL LOW (ref 22–32)
Calcium: 8.2 mg/dL — ABNORMAL LOW (ref 8.9–10.3)
Chloride: 108 mmol/L (ref 98–111)
Creatinine, Ser: 2.53 mg/dL — ABNORMAL HIGH (ref 0.61–1.24)
GFR, Estimated: 29 mL/min — ABNORMAL LOW (ref >60)
Glucose, Bld: 128 mg/dL — ABNORMAL HIGH (ref 70–99)
Potassium: 4.1 mmol/L (ref 3.5–5.1)
Sodium: 136 mmol/L (ref 135–145)
Total Bilirubin: 0.5 mg/dL (ref <1.2)
Total Protein: 8.2 g/dL — ABNORMAL HIGH (ref 6.5–8.1)

## 2023-03-26 MED ORDER — CYANOCOBALAMIN 1000 MCG/ML IJ SOLN
1000.0000 ug | Freq: Once | INTRAMUSCULAR | Status: AC
Start: 2023-03-26 — End: 2023-03-26
  Administered 2023-03-26: 1000 ug via INTRAMUSCULAR
  Filled 2023-03-26: qty 1

## 2023-03-26 NOTE — Patient Instructions (Signed)
[facility] - A DEPT OF [facility]  Discharge Instructions: Thank you for choosing [facility] to provide your oncology and hematology care.  If you have a lab appointment with the Cancer Center - please note that after April 8th, 2024, all labs will be drawn in the cancer center.  You do not have to check in or register with the main entrance as you have in the past but will complete your check-in in the cancer center.  Wear comfortable clothing and clothing appropriate for easy access to any Portacath or PICC line.   We strive to give you quality time with your provider. You may need to reschedule your appointment if you arrive late (15 or more minutes).  Arriving late affects you and other patients whose appointments are after yours.  Also, if you miss three or more appointments without notifying the office, you may be dismissed from the clinic at the provider's discretion.      For prescription refill requests, have your pharmacy contact our office and allow 72 hours for refills to be completed.    Today you received the following chemotherapy and/or immunotherapy agents B12      To help prevent nausea and vomiting after your treatment, we encourage you to take your nausea medication as directed.  BELOW ARE SYMPTOMS THAT SHOULD BE REPORTED IMMEDIATELY: *FEVER GREATER THAN 100.4 F (38 C) OR HIGHER *CHILLS OR SWEATING *NAUSEA AND VOMITING THAT IS NOT CONTROLLED WITH YOUR NAUSEA MEDICATION *UNUSUAL SHORTNESS OF BREATH *UNUSUAL BRUISING OR BLEEDING *URINARY PROBLEMS (pain or burning when urinating, or frequent urination) *BOWEL PROBLEMS (unusual diarrhea, constipation, pain near the anus) TENDERNESS IN MOUTH AND THROAT WITH OR WITHOUT PRESENCE OF ULCERS (sore throat, sores in mouth, or a toothache) UNUSUAL RASH, SWELLING OR PAIN  UNUSUAL VAGINAL DISCHARGE OR ITCHING   Items with * indicate a potential emergency and should be followed up as  soon as possible or go to the Emergency Department if any problems should occur.  Please show the CHEMOTHERAPY ALERT CARD or IMMUNOTHERAPY ALERT CARD at check-in to the Emergency Department and triage nurse.  Should you have questions after your visit or need to cancel or reschedule your appointment, please contact [place] [facility] - A DEPT OF [facility] [phone]  and follow the prompts.  Office hours are 8:00 a.m. to 4:30 p.m. Monday - Friday. Please note that voicemails left after 4:00 p.m. may not be returned until the following business day.  We are closed weekends and major holidays. You have access to a nurse at all times for urgent questions. Please call the main number to the clinic [phone] and follow the prompts.  For any non-urgent questions, you may also contact your provider using MyChart. We now offer e-Visits for anyone 13 and older to request care online for non-urgent symptoms. For details visit [email or web address].   Also download the MyChart app! Go to the app store, search "MyChart", open the app, select [place], and log in with your MyChart username and password.

## 2023-03-26 NOTE — Progress Notes (Signed)
Patient presents today for B12 injection per providers order.  Vital signs WNL.  Patient has no new complaints at this time.  Stable during administration without incident; injection site WNL; see MAR for injection details.  Patient tolerated procedure well and without incident.  No questions or complaints noted at this time.

## 2023-03-27 LAB — AFP TUMOR MARKER: AFP, Serum, Tumor Marker: 2.2 ng/mL (ref 0.0–8.4)

## 2023-04-03 ENCOUNTER — Ambulatory Visit (HOSPITAL_COMMUNITY): Admission: RE | Admit: 2023-04-03 | Payer: Medicaid Other | Source: Ambulatory Visit

## 2023-04-12 ENCOUNTER — Ambulatory Visit (HOSPITAL_COMMUNITY)
Admission: RE | Admit: 2023-04-12 | Discharge: 2023-04-12 | Disposition: A | Discharge status: Home / Self Care | Payer: Medicaid Other | Source: Ambulatory Visit | Attending: Physician Assistant | Admitting: Physician Assistant

## 2023-04-12 DIAGNOSIS — R7989 Other specified abnormal findings of blood chemistry: Secondary | ICD-10-CM | POA: Insufficient documentation

## 2023-04-12 MED ORDER — GADOBUTROL 1 MMOL/ML IV SOLN
7.0000 mL | Freq: Once | INTRAVENOUS | Status: AC | PRN
  Administered 2023-04-12: 7 mL via INTRAVENOUS

## 2023-04-27 ENCOUNTER — Inpatient Hospital Stay: Payer: Medicaid Other | Attending: Physician Assistant

## 2023-04-27 DIAGNOSIS — E538 Deficiency of other specified B group vitamins: Secondary | ICD-10-CM | POA: Insufficient documentation

## 2023-04-30 ENCOUNTER — Inpatient Hospital Stay: Payer: Medicaid Other

## 2023-04-30 VITALS — BP 120/73 | HR 76 | Temp 96.7°F | Resp 18

## 2023-04-30 DIAGNOSIS — E538 Deficiency of other specified B group vitamins: Secondary | ICD-10-CM | POA: Diagnosis present

## 2023-04-30 MED ORDER — CYANOCOBALAMIN 1000 MCG/ML IJ SOLN
1000.0000 ug | Freq: Once | INTRAMUSCULAR | Status: AC
  Administered 2023-04-30: 1000 ug via INTRAMUSCULAR
  Filled 2023-04-30: qty 1

## 2023-04-30 NOTE — Progress Notes (Signed)
Patient tolerated Vitamin B 12 injection with no complaints voiced.  Site clean and dry with no bruising or swelling noted.  No complaints of pain.  Discharged with vital signs stable and no signs or symptoms of distress noted.   ?

## 2023-04-30 NOTE — Patient Instructions (Signed)
 [facility] [place] [facility]  Discharge Instructions: Thank you for choosing [facility] to provide your oncology and hematology care.  If you have a lab appointment with the Cancer Center - please note that after April 8th, 2024, all labs will be drawn in the cancer center.  You do not have to check in or register with the main entrance as you have in the past but will complete your check-in in the cancer center.  Wear comfortable clothing and clothing appropriate for easy access to any Portacath or PICC line.   We strive to give you quality time with your provider. You may need to reschedule your appointment if you arrive late (15 or more minutes).  Arriving late affects you and other patients whose appointments are after yours.  Also, if you miss three or more appointments without notifying the office, you may be dismissed from the clinic at the provider's discretion.      For prescription refill requests, have your pharmacy contact our office and allow 72 hours for refills to be completed.    Today you received the following:  Vitamin B12.  Vitamin B12 Injection What is this medication? Vitamin B12 (VAHY tuh min B12) prevents and treats low vitamin B12 levels in your body. It is used in people who do not get enough vitamin B12 from their diet or when their digestive tract does not absorb enough. Vitamin B12 plays an important role in maintaining the health of your nervous system and red blood cells. This medicine may be used for other purposes; ask your health care provider or pharmacist if you have questions. COMMON BRAND NAME(S): B-12 Compliance Kit, B-12 Injection Kit, Cyomin, Dodex, LA-12, Nutri-Twelve, Physicians EZ Use B-12, Primabalt, Vitamin Deficiency Injectable System - B12 What should I tell my care team before I take this medication? They need to know if you have any of these conditions: Kidney disease Leber's  disease Megaloblastic anemia An unusual or allergic reaction to cyanocobalamin, cobalt, other medications, foods, dyes, or preservatives Pregnant or trying to get pregnant Breast-feeding How should I use this medication? This medication is injected into a muscle or deeply under the skin. It is usually given in a clinic or care team's office. However, your care team may teach you how to inject yourself. Follow all instructions. Talk to your care team about the use of this medication in children. Special care may be needed. Overdosage: If you think you have taken too much of this medicine contact a poison control center or emergency room at once. NOTE: This medicine is only for you. Do not share this medicine with others. What if I miss a dose? If you are given your dose at a clinic or care team's office, call to reschedule your appointment. If you give your own injections, and you miss a dose, take it as soon as you can. If it is almost time for your next dose, take only that dose. Do not take double or extra doses. What may interact with this medication? Alcohol Colchicine This list may not describe all possible interactions. Give your health care provider a list of all the medicines, herbs, non-prescription drugs, or dietary supplements you use. Also tell them if you smoke, drink alcohol, or use illegal drugs. Some items may interact with your medicine. What should I watch for while using this medication? Visit your care team regularly. You may need blood work done while you are  taking this medication. You may need to follow a special diet. Talk to your care team. Limit your alcohol intake and avoid smoking to get the best benefit. What side effects may I notice from receiving this medication? Side effects that you should report to your care team as soon as possible: Allergic reactions--skin rash, itching, hives, swelling of the face, lips, tongue, or throat Swelling of the ankles, hands, or  feet Trouble breathing Side effects that usually do not require medical attention (report to your care team if they continue or are bothersome): Diarrhea This list may not describe all possible side effects. Call your doctor for medical advice about side effects. You may report side effects to FDA at 1-800-FDA-1088. Where should I keep my medication? Keep out of the reach of children. Store at room temperature between 15 and 30 degrees C (59 and 85 degrees F). Protect from light. Throw away any unused medication after the expiration date. NOTE: This sheet is a summary. It may not cover all possible information. If you have questions about this medicine, talk to your doctor, pharmacist, or health care provider.  2024 Elsevier/Gold Standard (2020-12-28 00:00:00)     To help prevent nausea and vomiting after your treatment, we encourage you to take your nausea medication as directed.  BELOW ARE SYMPTOMS THAT SHOULD BE REPORTED IMMEDIATELY: *FEVER GREATER THAN 100.4 F (38 C) OR HIGHER *CHILLS OR SWEATING *NAUSEA AND VOMITING THAT IS NOT CONTROLLED WITH YOUR NAUSEA MEDICATION *UNUSUAL SHORTNESS OF BREATH *UNUSUAL BRUISING OR BLEEDING *URINARY PROBLEMS (pain or burning when urinating, or frequent urination) *BOWEL PROBLEMS (unusual diarrhea, constipation, pain near the anus) TENDERNESS IN MOUTH AND THROAT WITH OR WITHOUT PRESENCE OF ULCERS (sore throat, sores in mouth, or a toothache) UNUSUAL RASH, SWELLING OR PAIN  UNUSUAL VAGINAL DISCHARGE OR ITCHING   Items with * indicate a potential emergency and should be followed up as soon as possible or go to the Emergency Department if any problems should occur.  Please show the CHEMOTHERAPY ALERT CARD or IMMUNOTHERAPY ALERT CARD at check-in to the Emergency Department and triage nurse.  Should you have questions after your visit or need to cancel or reschedule your appointment, please contact [facility] [place] - A DEPT OF [facility] [phone]  and follow the prompts.  Office hours are 8:00 a.m. to 4:30 p.m. Monday - Friday. Please note that voicemails left after 4:00 p.m. may not be returned until the following business day.  We are closed weekends and major holidays. You have access to a nurse at all times for urgent questions. Please call the main number to the clinic [phone] and follow the prompts.  For any non-urgent questions, you may also contact your provider using MyChart. We now offer e-Visits for anyone 99 and older to request care online for non-urgent symptoms. For details visit [email or web address].   Also download the MyChart app! Go to the app store, search "MyChart", open the app, select [place], and log in with your MyChart username and password.

## 2023-05-03 ENCOUNTER — Encounter: Payer: Self-pay | Admitting: Gastroenterology

## 2023-05-03 ENCOUNTER — Ambulatory Visit: Payer: Medicaid Other | Admitting: Gastroenterology

## 2023-05-10 ENCOUNTER — Ambulatory Visit (INDEPENDENT_AMBULATORY_CARE_PROVIDER_SITE_OTHER): Payer: Medicaid Other | Admitting: Gastroenterology

## 2023-05-10 ENCOUNTER — Encounter: Payer: Self-pay | Admitting: Gastroenterology

## 2023-05-10 VITALS — BP 113/74 | HR 88 | Temp 98.7°F | Ht 72.0 in | Wt 169.4 lb

## 2023-05-10 DIAGNOSIS — K7460 Unspecified cirrhosis of liver: Secondary | ICD-10-CM | POA: Diagnosis not present

## 2023-05-10 DIAGNOSIS — F1091 Alcohol use, unspecified, in remission: Secondary | ICD-10-CM | POA: Diagnosis not present

## 2023-05-10 DIAGNOSIS — K824 Cholesterolosis of gallbladder: Secondary | ICD-10-CM

## 2023-05-10 DIAGNOSIS — Z860101 Personal history of adenomatous and serrated colon polyps: Secondary | ICD-10-CM

## 2023-05-10 DIAGNOSIS — K219 Gastro-esophageal reflux disease without esophagitis: Secondary | ICD-10-CM | POA: Diagnosis not present

## 2023-05-10 MED ORDER — TAMSULOSIN HCL 0.4 MG PO CAPS: 0.4000 mg | ORAL_CAPSULE | Freq: Every day | ORAL | 2 refills | Status: DC

## 2023-05-10 NOTE — Progress Notes (Addendum)
 Gastroenterology Office Note     Primary Care Physician:  Carlette Benita Area, MD  Primary Gastroenterologist: Dr. Shaaron    Chief Complaint   Chief Complaint  Patient presents with   Follow-up    Follow up on alcohol  cirrhosis. Pt states nothing is bothering him today     History of Present Illness   JAMARIS THEARD is a 58 y.o. 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 Notably, evaluation for cholecystectomy was done in 2024 by [facility] and felt not to be a candidate in light of cirrhosis as risks outweighed the benefits.   Notes abdominal pain has resolved. Lasted a few weeks. Didn't present to ED as recommended, as pain didn't worsen. He feels well today. Back to his baseline. Notes feeling short-winded at time for the last month. No DOE. No chest pain. No cough. Abdomen feels tight at times. Pantoprazole  BID recently with abdominal pain but now improved. No rectal bleeding. Good appetite.   Cirrhosis related questions: Episodes of confusion/disorientation: No Taking diuretics? Lasix  40 mg in am and 20 mg in pm, aldactone  100 mg in morning and 50 mg in pm  Beta blockers? No Prior history of variceal banding? no Prior episodes of SBP? no Last liver imaging: Nov 2024: cirrhosis, cholelithiasis with mild gallbladder wall thickening, gallbladder polyp 4mm. US  due in May 2025.  Alcohol  use:  PEth markedly positive in Aug 2024. Ethanol level positive 02/25/23. Last drink in Nov 2024  EGD May 2023 with normal esophagus but retained food in stomach and duodenum precluded exam. EGD variceal screening due May 2025  Colonoscopy Dec 2023: pancolonic diverticulosis, multiple polyps, one which was  11 mm (tubular adenomas). Surveillance in 3 years.   Past Medical History:  Diagnosis Date   Asthma    B12 deficiency 03/15/2022   Cirrhosis (HCC)    Dyspnea    High cholesterol    History of kidney stones    Hypertension    Kidney stones     Past Surgical History:  Procedure Laterality Date   APPENDECTOMY     BIOPSY  07/22/2020   Procedure: BIOPSY;  Surgeon: Shaaron Lamar HERO, MD;  [facility];  Service: Endoscopy;;   COLONOSCOPY WITH PROPOFOL  N/A 04/18/2017   non-bleeding internal hemorrhoids, two 4-6 mm polyps in descending colon and cecum, pancolonic diverticulosis, single cecal AVM. Tubular adenomas, surveillance in 2023.    COLONOSCOPY WITH PROPOFOL  N/A 04/13/2022   pancolonic diverticulosis, multiple polyps, one which was 11 mm (tubular adenomas). Surveillance in 3 years.   ESOPHAGOGASTRODUODENOSCOPY (EGD) WITH PROPOFOL  N/A 07/22/2020   normal esophagus, small hiatal hernia, abnormal gastric mucosa, abnormal appearing ampula and periampullary mucosa. Mild chronic gastritis.   ESOPHAGOGASTRODUODENOSCOPY (EGD) WITH PROPOFOL  N/A 09/08/2021   normal esophagus, retained food in stomach and duodenum precluded complete examination.   FRACTURE SURGERY     left arm   IR PARACENTESIS  05/31/2021   LOWER EXTREMITY VENOGRAPHY N/A 08/10/2020   Procedure: LOWER EXTREMITY VENOGRAPHY;  Surgeon: Sheree Penne Bruckner, MD;  Location[facility] INVASIVE CV LAB;  Service: Cardiovascular;  Laterality: N/A;   PERIPHERAL VASCULAR BALLOON ANGIOPLASTY Left 08/10/2020   Procedure: PERIPHERAL VASCULAR BALLOON ANGIOPLASTY;  Surgeon: Sheree Penne Bruckner, MD;  Location: [facility]  INVASIVE CV LAB;  Service: Cardiovascular;  Laterality: Left;  lower extremity venous   PERIPHERAL VASCULAR THROMBECTOMY N/A 08/10/2020   Procedure: PERIPHERAL VASCULAR THROMBECTOMY;  Surgeon: Sheree Penne Bruckner, MD;  Location: [facility] INVASIVE CV LAB;  Service: Cardiovascular;  Laterality: N/A;   POLYPECTOMY   04/18/2017   Procedure: POLYPECTOMY;  Surgeon: Shaaron Lamar HERO, MD;  [facility];  Service: Endoscopy;;   POLYPECTOMY  04/13/2022   Procedure: POLYPECTOMY;  Surgeon: Shaaron Lamar HERO, MD;  [facility];  Service: Endoscopy;;    Current Outpatient Medications  Medication Sig Dispense Refill   albuterol  (PROVENTIL  HFA) 108 (90 Base) MCG/ACT inhaler INHALE 2 PUFFS BY MOUTH EVERY 6 HOURS AS NEEDED FOR COUGHING, WHEEZING, OR SHORTNESS OF BREATH (Patient taking differently: Inhale 2 puffs into the lungs every 6 (six) hours as needed for shortness of breath or wheezing.) 18 g 1   aspirin  EC 81 MG tablet Take 81 mg by mouth daily.     cyanocobalamin  (VITAMIN B12) 1000 MCG/ML injection Inject 1,000 mcg into the muscle every 30 (thirty) days.     feeding supplement (ENSURE ENLIVE / ENSURE PLUS) LIQD Take 237 mLs by mouth 3 (three) times daily between meals. 237 mL 12   furosemide  (LASIX ) 20 MG tablet TAKE 2 TABLETS BY MOUTH EVERY MORNING AND 1 TABLET IN THE AFTERNOON (Patient taking differently: Take 20 mg by mouth See admin instructions. Take 40 mg in the morning and 20 mg every afternoon) 90 tablet 3   metoprolol  tartrate (LOPRESSOR ) 25 MG tablet Take 25 mg by mouth 2 (two) times daily.     mometasone -formoterol  (DULERA ) 200-5 MCG/ACT AERO Inhale 2 puffs into the lungs 2 (two) times daily. 13 g 3   ondansetron  (ZOFRAN ) 4 MG tablet Take 1 tablet (4 mg total) by mouth daily as needed for nausea or vomiting. 30 tablet 1   pantoprazole  (PROTONIX ) 40 MG tablet TAKE 1 TABLET(40 MG) BY MOUTH DAILY (Patient taking differently: Take 40 mg by mouth 2 (two) times daily.) 90 tablet 3   sodium bicarbonate  650 MG tablet Take 1 tablet (650 mg total) by mouth 3 (three) times daily. 90 tablet 1   spironolactone  (ALDACTONE ) 50 MG tablet TAKE 2 TABLETS BY MOUTH EVERY MORNING AND 1 TABLET DAILY IN THE AFTERNOON (Patient taking differently: Take 50 mg by mouth See admin instructions. Take 100 mg in the  morning and 50 mg in the afternoon) 180 tablet 2   tamsulosin  (FLOMAX ) 0.4 MG CAPS capsule Take 1 capsule (0.4 mg total) by mouth daily after supper. 30 capsule 2   No current facility-administered medications for this visit.    Allergies as of 05/10/2023   (No Known Allergies)    Family History  Problem Relation Age of Onset   Cancer Father        throat   Diabetes Sister    Colon cancer Neg Hx     Social History   Socioeconomic History   Marital status: Single    Spouse name: Not on file   Number of children: Not on file   Years of education: Not on file   Highest education level: Not on file  Occupational History   Not on file  Tobacco Use   Smoking status: Every Day    Current packs/day: 0.50    Average packs/day: 0.5 packs/day for 33.0 years (16.5 ttl pk-yrs)    Types: Cigarettes    Passive exposure: Current   Smokeless tobacco: Never  Vaping Use  Vaping status: Never Used  Substance and Sexual Activity   Alcohol  use: Not Currently    Comment: last deink 1 week ago   Drug use: No   Sexual activity: Yes    Birth control/protection: None  Other Topics Concern   Not on file  Social History Narrative   Not on file   Social Drivers of Health   Financial Resource Strain: High Risk (11/28/2021)   [facility]   Overall Financial Resource Strain (CARDIA)    Difficulty of Paying Living Expenses: Very hard  Food Insecurity: No Food Insecurity (03/12/2023)   Hunger Vital Sign    Worried About Running Out of Food in the Last Year: Never true    Ran Out of Food in the Last Year: Never true  Transportation Needs: No Transportation Needs (03/12/2023)   PRAPARE - Administrator, Civil Service (Medical): No    Lack of Transportation (Non-Medical): No  Physical Activity: Sufficiently Active (09/13/2020)   Exercise Vital Sign    Days of Exercise per Week: 7 days    Minutes of Exercise per Session: 30 min  Stress: No Stress  Concern Present (09/13/2020)   [employer] of Occupational Health - Occupational Stress Questionnaire    Feeling of Stress : Only a little  Social Connections: Moderately Isolated (09/13/2020)   Social Connection and Isolation Panel [NHANES]    Frequency of Communication with Friends and Family: More than three times a week    Frequency of Social Gatherings with Friends and Family: Once a week    Attends Religious Services: More than 4 times per year    Active Member of [employer] or Organizations: No    Attends Banker Meetings: Never    Marital Status: Never married  Intimate Partner Violence: Not At Risk (03/12/2023)   Humiliation, Afraid, Rape, and Kick questionnaire    Fear of Current or Ex-Partner: No    Emotionally Abused: No    Physically Abused: No    Sexually Abused: No     Review of Systems   Gen: Denies any fever, chills, fatigue, weight loss, lack of appetite.  CV: Denies chest pain, heart palpitations, peripheral edema, syncope.  Resp: Denies shortness of breath at rest or with exertion. Denies wheezing or cough.  GI: Denies dysphagia or odynophagia. Denies jaundice, hematemesis, fecal incontinence. GU : Denies urinary burning, urinary frequency, urinary hesitancy MS: Denies joint pain, muscle weakness, cramps, or limitation of movement.  Derm: Denies rash, itching, dry skin Psych: Denies depression, anxiety, memory loss, and confusion Heme: Denies bruising, bleeding, and enlarged lymph nodes.   Physical Exam   BP 113/74   Pulse 88   Temp 98.7 F (37.1 C)   Ht 6' (1.829 m)   Wt 169 lb 6.4 oz (76.8 kg)   BMI 22.97 kg/m  General:   Alert and oriented. Pleasant and cooperative. Well-nourished and well-developed.  Head:  Normocephalic and atraumatic. Eyes:  Without icterus Abdomen:  +BS, soft, non-tender and non-distended. Hepatomegaly with liver margin palpable.  Rectal:  Deferred  Msk:  Symmetrical without gross deformities. Normal  posture. Extremities:  Without edema. Neurologic:  Alert and  oriented x4;  grossly normal neurologically. Skin:  Intact without significant lesions or rashes. Psych:  Alert and cooperative. Normal mood and affect.   Assessment   ZAELYN BARBARY is a 58 y.o. 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  Cirrhosis in setting of ETOH: maintained on diuretics with creatinine near baseline and followed by Nephrology closely. Due for US  in May 2025 along with EGD for variceal screening. Discussed again absolute avoidance of alcohol .   Gallbladder polyp: surveillance at time of upcoming US . Hx of gallstones and not felt to be surgical candidate previously in light of advanced cirrhosis.   GERD: decrease pantoprazole  to once daily.  Adenomas: surveillance in 2026.    PLAN    Decrease pantoprazole  to once daily Continue Lasix  40 mg in am, 20 mg in pm along with spironolactone  100 mg in am, 50 in pm US  in May 2025 Return in April 2025 EGD variceal screening May 2025 Colonoscopy 2026 Alcohol  abstinence   Therisa MICAEL Stager, PhD, [facility]

## 2023-05-10 NOTE — Patient Instructions (Addendum)
 Decrease pantoprazole  to once a day.   Continue to avoid all alcohol  completely.  Please have chest xray done when you are able to do so. I want to make sure nothing is going on that is causing your shortness of breath.   Your next ultrasound is due in May 2025. Your next upper endoscopy will be due in May 2025 as well.  I will see you in April 2025!  Have a great upcoming birthday!   I enjoyed seeing you again today! I value our relationship and want to provide genuine, compassionate, and quality care. You may receive a survey regarding your visit with me, and I welcome your feedback! Thanks so much for taking the time to complete this. I look forward to seeing you again.      Therisa MICAEL Stager, PhD, ANP-BC [facility]

## 2023-05-28 ENCOUNTER — Inpatient Hospital Stay: Payer: Medicaid Other | Attending: Physician Assistant

## 2023-05-28 VITALS — BP 146/81 | HR 74 | Temp 96.7°F | Resp 18

## 2023-05-28 DIAGNOSIS — E538 Deficiency of other specified B group vitamins: Secondary | ICD-10-CM | POA: Diagnosis present

## 2023-05-28 MED ORDER — CYANOCOBALAMIN 1000 MCG/ML IJ SOLN
1000.0000 ug | Freq: Once | INTRAMUSCULAR | Status: AC
  Administered 2023-05-28: 1000 ug via INTRAMUSCULAR
  Filled 2023-05-28: qty 1

## 2023-05-28 NOTE — Progress Notes (Signed)
B12 injection  given per orders. Patient tolerated it well without problems. Vitals stable and discharged home from clinic ambulatory. Follow up as scheduled.

## 2023-05-28 NOTE — Patient Instructions (Signed)
[facility] [place] [facility]  Discharge Instructions: Thank you for choosing [facility] to provide your oncology and hematology care.  If you have a lab appointment with the Cancer Center - please note that after April 8th, 2024, all labs will be drawn in the cancer center.  You do not have to check in or register with the main entrance as you have in the past but will complete your check-in in the cancer center.  Wear comfortable clothing and clothing appropriate for easy access to any Portacath or PICC line.   We strive to give you quality time with your provider. You may need to reschedule your appointment if you arrive late (15 or more minutes).  Arriving late affects you and other patients whose appointments are after yours.  Also, if you miss three or more appointments without notifying the office, you may be dismissed from the clinic at the provider's discretion.      For prescription refill requests, have your pharmacy contact our office and allow 72 hours for refills to be completed.    Today you received the following: B12 injection   To help prevent nausea and vomiting after your treatment, we encourage you to take your nausea medication as directed.  BELOW ARE SYMPTOMS THAT SHOULD BE REPORTED IMMEDIATELY: *FEVER GREATER THAN 100.4 F (38 C) OR HIGHER *CHILLS OR SWEATING *NAUSEA AND VOMITING THAT IS NOT CONTROLLED WITH YOUR NAUSEA MEDICATION *UNUSUAL SHORTNESS OF BREATH *UNUSUAL BRUISING OR BLEEDING *URINARY PROBLEMS (pain or burning when urinating, or frequent urination) *BOWEL PROBLEMS (unusual diarrhea, constipation, pain near the anus) TENDERNESS IN MOUTH AND THROAT WITH OR WITHOUT PRESENCE OF ULCERS (sore throat, sores in mouth, or a toothache) UNUSUAL RASH, SWELLING OR PAIN  UNUSUAL VAGINAL DISCHARGE OR ITCHING   Items with * indicate a potential emergency and should be followed up as soon as possible or go to the  Emergency Department if any problems should occur.  Please show the CHEMOTHERAPY ALERT CARD or IMMUNOTHERAPY ALERT CARD at check-in to the Emergency Department and triage nurse.  Should you have questions after your visit or need to cancel or reschedule your appointment, please contact [facility] [place] - A DEPT OF Eligha Bridegroom [facility] [phone]  and follow the prompts.  Office hours are 8:00 a.m. to 4:30 p.m. Monday - Friday. Please note that voicemails left after 4:00 p.m. may not be returned until the following business day.  We are closed weekends and major holidays. You have access to a nurse at all times for urgent questions. Please call the main number to the clinic [phone] and follow the prompts.  For any non-urgent questions, you may also contact your provider using MyChart. We now offer e-Visits for anyone 66 and older to request care online for non-urgent symptoms. For details visit [email or web address].   Also download the MyChart app! Go to the app store, search "MyChart", open the app, select [place], and log in with your MyChart username and password.

## 2023-06-20 ENCOUNTER — Inpatient Hospital Stay: Payer: Medicaid Other

## 2023-06-25 ENCOUNTER — Inpatient Hospital Stay: Payer: Medicaid Other | Attending: Physician Assistant

## 2023-06-25 DIAGNOSIS — N184 Chronic kidney disease, stage 4 (severe): Secondary | ICD-10-CM | POA: Diagnosis not present

## 2023-06-25 DIAGNOSIS — D631 Anemia in chronic kidney disease: Secondary | ICD-10-CM | POA: Diagnosis not present

## 2023-06-25 DIAGNOSIS — R7989 Other specified abnormal findings of blood chemistry: Secondary | ICD-10-CM | POA: Diagnosis not present

## 2023-06-25 DIAGNOSIS — F17210 Nicotine dependence, cigarettes, uncomplicated: Secondary | ICD-10-CM | POA: Insufficient documentation

## 2023-06-25 DIAGNOSIS — N1832 Chronic kidney disease, stage 3b: Secondary | ICD-10-CM

## 2023-06-25 DIAGNOSIS — E531 Pyridoxine deficiency: Secondary | ICD-10-CM | POA: Diagnosis not present

## 2023-06-25 DIAGNOSIS — Z86718 Personal history of other venous thrombosis and embolism: Secondary | ICD-10-CM | POA: Diagnosis not present

## 2023-06-25 DIAGNOSIS — G63 Polyneuropathy in diseases classified elsewhere: Secondary | ICD-10-CM

## 2023-06-25 DIAGNOSIS — E538 Deficiency of other specified B group vitamins: Secondary | ICD-10-CM | POA: Diagnosis present

## 2023-06-25 DIAGNOSIS — R768 Other specified abnormal immunological findings in serum: Secondary | ICD-10-CM | POA: Diagnosis not present

## 2023-06-25 LAB — CBC WITH DIFFERENTIAL/PLATELET
Abs Immature Granulocytes: 0.02 10*3/uL (ref 0.00–0.07)
Basophils Absolute: 0.1 10*3/uL (ref 0.0–0.1)
Basophils Relative: 1 %
Eosinophils Absolute: 0.1 10*3/uL (ref 0.0–0.5)
Eosinophils Relative: 1 %
HCT: 37 % — ABNORMAL LOW (ref 39.0–52.0)
Hemoglobin: 11.9 g/dL — ABNORMAL LOW (ref 13.0–17.0)
Immature Granulocytes: 0 %
Lymphocytes Relative: 13 %
Lymphs Abs: 0.9 10*3/uL (ref 0.7–4.0)
MCH: 29.4 pg (ref 26.0–34.0)
MCHC: 32.2 g/dL (ref 30.0–36.0)
MCV: 91.4 fL (ref 80.0–100.0)
Monocytes Absolute: 1.2 10*3/uL — ABNORMAL HIGH (ref 0.1–1.0)
Monocytes Relative: 17 %
Neutro Abs: 4.9 10*3/uL (ref 1.7–7.7)
Neutrophils Relative %: 68 %
Platelets: 359 10*3/uL (ref 150–400)
RBC: 4.05 MIL/uL — ABNORMAL LOW (ref 4.22–5.81)
RDW: 15.1 % (ref 11.5–15.5)
WBC: 7.2 10*3/uL (ref 4.0–10.5)
nRBC: 0 % (ref 0.0–0.2)

## 2023-06-25 LAB — COMPREHENSIVE METABOLIC PANEL
ALT: 9 U/L (ref 0–44)
AST: 32 U/L (ref 15–41)
Albumin: 2.7 g/dL — ABNORMAL LOW (ref 3.5–5.0)
Alkaline Phosphatase: 94 U/L (ref 38–126)
Anion gap: 10 (ref 5–15)
BUN: 23 mg/dL — ABNORMAL HIGH (ref 6–20)
CO2: 24 mmol/L (ref 22–32)
Calcium: 8.6 mg/dL — ABNORMAL LOW (ref 8.9–10.3)
Chloride: 100 mmol/L (ref 98–111)
Creatinine, Ser: 2.04 mg/dL — ABNORMAL HIGH (ref 0.61–1.24)
GFR, Estimated: 37 mL/min — ABNORMAL LOW (ref >60)
Glucose, Bld: 91 mg/dL (ref 70–99)
Potassium: 4.5 mmol/L (ref 3.5–5.1)
Sodium: 134 mmol/L — ABNORMAL LOW (ref 135–145)
Total Bilirubin: 0.6 mg/dL (ref 0.0–1.2)
Total Protein: 8.2 g/dL — ABNORMAL HIGH (ref 6.5–8.1)

## 2023-06-25 LAB — FERRITIN: Ferritin: 218 ng/mL (ref 24–336)

## 2023-06-25 LAB — IRON AND TIBC
Iron: 50 ug/dL (ref 45–182)
Saturation Ratios: 22 % (ref 17.9–39.5)
TIBC: 224 ug/dL — ABNORMAL LOW (ref 250–450)
UIBC: 174 ug/dL

## 2023-06-26 NOTE — Progress Notes (Unsigned)
 [facility] [address], [place] [address]   CLINIC:  Medical Oncology/Hematology  PCP:  Benetta Spar, MD [address] [place] [address] [phone]   REASON FOR VISIT:  Follow-up for elevated ferritin (H63D heterozygous) and normocytic anemia   PRIOR THERAPY: None   CURRENT THERAPY: B12 injections  INTERVAL HISTORY:   [name] [name] 58 y.o. male returns for routine follow-up of elevated ferritin and normocytic anemia.  He was last seen by Rojelio Brenner PA-C on 09/29/2022.     Since his last visit, he was hospitalized from 03/11/2023 through 03/14/2023 due to acute pancreatitis and AKI on CKD stage IIIb.   At today's visit, he reports feeling *** apart from ongoing fatigue. *** He denies any abdominal pain. *** No abnormal joint pain or skin changes.     *** Last alcohol was *** (prior to hospitalization in November?) *** *** He denies any excessive iron intake such as iron supplementation, organ meats, or large amounts of red meat.    He continues to take aspirin for his history of DVT and is following with vascular surgery.***  He has bilateral leg swelling, but denies any unilateral leg swelling or other signs or symptoms of current DVT or PE.     He continues to have some peripheral neuropathy and paresthesia despite vitamin B6 supplementation, although his symptoms did improve.***   He has 25***% energy and 100***% appetite.  His weight fluctuates related to fluid overload and intermittent paracentesis, but overall is stable.  ASSESSMENT & PLAN:  1.   Elevated ferritin with hemosiderosis, H63D heterozygous - Previously diagnosed with alcoholic liver cirrhosis, and was found to have elevated ferritin 1,254 on 07/20/2020 (normal ferritin noted in 2018). - Patient will intermittently stop alcohol consumption with sporadic relapses. Reports drinking about 80 ounces of beer daily for several weeks prior to hospitalization in  November 2024.*** - Genetic testing showed patient to be H63D heterozygote for hemochromatosis gene - MRI abdomen (06/29/2021): Hepatic parenchymal signal suggestive of hemosiderosis - MRI abdomen (04/12/2023): Liver iron content estimated at 1.53 mg Fe/g, which is within normal limits.  *** - Iron trends: Elevated ferritin since March 2022, with ferritin as high as 1477 (05/22/2022) Elevated iron saturation since at least 2018, as high as 98% saturation (08/06/2020) May 2024: Normalized ferritin 217, iron saturation 15% (alcohol cessation during this timeframe) November 2024: Ferritin 561, iron saturation 15% (increased/relapsed alcohol consumption at this time) - Most recent labs (06/25/2023): Ferritin normalized at 218, iron saturation 22%.  (*** Current alcohol ***) - Patient follows with NP Lewie Loron [facility]) as well as NP [name] ([facility]) - Discussed extensively with Dr. Ellin Saba (supervising physician): Elevated ferritin thought most likely to be due to alcohol consumption and liver disease rather than H63D heterozygosity, since alcoholic liver disease is associated with increased iron deposition in the liver.  Additionally, H63D heterozygosity is unlikely to cause increased ferritin that would be high enough to cause primary liver damage.  Patient is not felt to be a candidate for phlebotomy due to his anemia.  No indication for chelating agents with his current ferritin < 1,000.  If liver biopsy were to show worsening cirrhosis definitively caused by increased iron deposition in hepatocytes (or if future MRI liver shows moderate to severe iron deposition), we would certainly consider chelation therapy. - PLAN:  Elevated ferritin likely due to liver disease more so than H63D heterozygosity. - Continue monitoring with repeat labs and RTC in  4 months *** - Patient counseled to continue to abstain from alcohol and to avoid excessive dietary iron.    2. Left lower  extremity DVT - Patient being followed by vascular team for extensive left lower extremity DVT diagnosed 08/04/2020 - beginning at the level of the left external iliac vein origin and extending into the common femoral, superficial femoral, and deep femoral veins to the level of the popliteal vein - Ultrasound-guided LLE thrombectomy performed by Dr. Lemar Livings of vascular surgery - Was on Eliquis since 08/04/2020 through 02/10/2021. - Eliquis discontinued on 02/10/2021 due to anemia with concern for possible GI bleed, was placed on aspirin instead by the recommendation of Dr. Randie Heinz - PLAN: Continue aspirin and follow-up as directed by vascular team.   3. Normocytic anemia secondary to CKD - Intermittent anemia with Hgb ranging from 7.5 hospitalization in October 2022) to normal over the past 12 months - Hematology work-up: SPEP and IFE showed polyclonal increase in immunoglobulins, and increased kappa/lambda light chains with normal ratio.  No M spike or monoclonal protein apparent. Reticulocytes and erythropoietin normal.  LDH mildly elevated.  Bilirubin normal. B12 and folic acid deficiency (see below) CKD stage IIIb/IV. - EGD (07/22/2020) with normal esophagus, small hiatal hernia, abnormal gastric mucosa, abnormal appearing ampula and periampullary mucosa. Mild chronic gastritis. - EGD (09/08/2021): Limited due to retained food in stomach/duodenum, but normal esophagus without any varices per report - Colonoscopy (04/13/2022): Diverticulosis, polyps x 9 (tubular adenoma) - He denies any overt signs of bleeding*** - He is symptomatic with mild chronic fatigue*** - Most recent labs (06/25/2023): Hgb 11.9/MCV 91.4, normal iron levels. Baseline CKD stage IIIb with creatinine 2.0/GFR 37 - DIFFERENTIAL DIAGNOSIS favors anemia of chronic disease/CKD IIIb, further complicated by B12 and folic acid deficiency as below - PLAN: Continue B12 and folic acid supplementation as below.  Otherwise, no indication  for treatment at this time, but we will continue to monitor with repeat labs and RTC in 4 months. - We will consider initiation of ESA if Hgb persistently < 10.0. - Continue nephrology follow-up with Dr. Bufford Buttner   4.  Folate & vitamin B12 deficiency - Labs from November 2023 showed B12 deficiency (B12 138, MMA 447) and folate deficiency (folate 4.4, homocystine 42.2). - Patient has been taking folic acid 400 mcg daily since November 2023 - Receiving monthly Vitamin B12 injections since November 2023  - Most recent labs (03/19/2023): B12 1053, MMA elevated at 428.  Folate 6.3, homocystine normalized at 13.4. - Suspect malabsorption in the setting of chronic alcoholism.*** - PLAN: Continue folic acid 400 mcg daily.*** - Continue monthly B12 injections.  *** - We will recheck B12 and folic acid levels at follow-up in 4 months   5.  Vitamin B6 deficiency - At office visit in May 2024, patient reported worsening numbness and tingling in his hands and feet - Denies any history of diabetes - SPEP/immunofixation checked in 2023 was negative for monoclonal gammopathy - Labs showed vitamin B6 deficiency at 2.6.  (Vitamin B1 and vitamin D were normal) - He has been taking vitamin B6 supplementation since May 2024 - Reports neuropathy has been mildly improved after starting vitamin B6, but he does continue to have numbness and tingling in his hands - PLAN: Continue vitamin B6 (pyridoxine) 25 mg daily.  Recheck at follow-up in 4 months. - We will refer to neurology for further workup of peripheral neuropathy   6.  Other history - His PMH is also notable for COPD  in addition to alcoholic liver cirrhosis and LLE DVT noted above. -The patient is a recovering alcoholic - previously drank 12 beers per day for 20 + years, significantly cut back in February 2022, but continues to drink on occasion - He is an everyday tobacco user, smoking 0.5 PPD for the past 30 years.   - He denies illicit drug use.    - He is on disability and lives at home with his fianc. - No family history of liver disease or blood clots.  Father had throat cancer secondary to smoking.  No family hemochromatosis history.       PLAN SUMMARY: >> Continue monthly B12 injections  >> Labs in 4 months = CBC/D, CMP, ferritin, iron/TIBC, B12, MMA, folate, vitamin B6 (pyridoxine) >> OFFICE visit in 4 months (1 week after labs)      REVIEW OF SYSTEMS: ***  Review of Systems - Oncology   PHYSICAL EXAM:  ECOG PERFORMANCE STATUS: {CHL ONC ECOG WG:[id number]} *** There were no vitals filed for this visit. There were no vitals filed for this visit. Physical Exam  PAST MEDICAL/SURGICAL HISTORY:  Past Medical History:  Diagnosis Date   Asthma    B12 deficiency 03/15/2022   Cirrhosis (HCC)    Dyspnea    High cholesterol    History of kidney stones    Hypertension    Kidney stones    Past Surgical History:  Procedure Laterality Date   APPENDECTOMY     BIOPSY  07/22/2020   Procedure: BIOPSY;  Surgeon: Corbin Ade, MD;  [facility];  Service: Endoscopy;;   COLONOSCOPY WITH PROPOFOL N/A 04/18/2017   non-bleeding internal hemorrhoids, two 4-6 mm polyps in descending colon and cecum, pancolonic diverticulosis, single cecal AVM. Tubular adenomas, surveillance in 2023.    COLONOSCOPY WITH PROPOFOL N/A 04/13/2022   pancolonic diverticulosis, multiple polyps, one which was 11 mm (tubular adenomas). Surveillance in 3 years.   ESOPHAGOGASTRODUODENOSCOPY (EGD) WITH PROPOFOL N/A 07/22/2020   normal esophagus, small hiatal hernia, abnormal gastric mucosa, abnormal appearing ampula and periampullary mucosa. Mild chronic gastritis.   ESOPHAGOGASTRODUODENOSCOPY (EGD) WITH PROPOFOL N/A 09/08/2021   normal esophagus, retained food in stomach and duodenum precluded complete examination.   FRACTURE SURGERY     left arm   IR PARACENTESIS  05/31/2021   LOWER EXTREMITY VENOGRAPHY N/A 08/10/2020   Procedure: LOWER  EXTREMITY VENOGRAPHY;  Surgeon: Maeola Harman, MD;  Location: [facility] INVASIVE CV LAB;  Service: Cardiovascular;  Laterality: N/A;   PERIPHERAL VASCULAR BALLOON ANGIOPLASTY Left 08/10/2020   Procedure: PERIPHERAL VASCULAR BALLOON ANGIOPLASTY;  Surgeon: Maeola Harman, MD;  Location: [facility] INVASIVE CV LAB;  Service: Cardiovascular;  Laterality: Left;  lower extremity venous   PERIPHERAL VASCULAR THROMBECTOMY N/A 08/10/2020   Procedure: PERIPHERAL VASCULAR THROMBECTOMY;  Surgeon: Maeola Harman, MD;  Location: [facility] INVASIVE CV LAB;  Service: Cardiovascular;  Laterality: N/A;   POLYPECTOMY  04/18/2017   Procedure: POLYPECTOMY;  Surgeon: Corbin Ade, MD;  [facility];  Service: Endoscopy;;   POLYPECTOMY  04/13/2022   Procedure: POLYPECTOMY;  Surgeon: Corbin Ade, MD;  [facility];  Service: Endoscopy;;    SOCIAL HISTORY:  Social History   Socioeconomic History   Marital status: Single    Spouse name: Not on file   Number of children: Not on file   Years of education: Not on file   Highest education level: Not on file  Occupational History   Not on file  Tobacco Use  Smoking status: Every Day    Current packs/day: 0.50    Average packs/day: 0.5 packs/day for 33.0 years (16.5 ttl pk-yrs)    Types: Cigarettes    Passive exposure: Current   Smokeless tobacco: Never  Vaping Use   Vaping status: Never Used  Substance and Sexual Activity   Alcohol use: Not Currently    Comment: last deink 1 week ago   Drug use: No   Sexual activity: Yes    Birth control/protection: None  Other Topics Concern   Not on file  Social History Narrative   Not on file   Social Drivers of Health   Financial Resource Strain: High Risk (11/28/2021)   [facility]   Overall Financial Resource Strain (CARDIA)    Difficulty of Paying Living Expenses: Very hard  Food Insecurity: No Food Insecurity (03/12/2023)   Hunger Vital  Sign    Worried About Running Out of Food in the Last Year: Never true    Ran Out of Food in the Last Year: Never true  Transportation Needs: No Transportation Needs (03/12/2023)   PRAPARE - Administrator, Civil Service (Medical): No    Lack of Transportation (Non-Medical): No  Physical Activity: Sufficiently Active (09/13/2020)   Exercise Vital Sign    Days of Exercise per Week: 7 days    Minutes of Exercise per Session: 30 min  Stress: No Stress Concern Present (09/13/2020)   [employer] of Occupational Health - Occupational Stress Questionnaire    Feeling of Stress : Only a little  Social Connections: Moderately Isolated (09/13/2020)   Social Connection and Isolation Panel [NHANES]    Frequency of Communication with Friends and Family: More than three times a week    Frequency of Social Gatherings with Friends and Family: Once a week    Attends Religious Services: More than 4 times per year    Active Member of [employer] or Organizations: No    Attends Banker Meetings: Never    Marital Status: Never married  Intimate Partner Violence: Not At Risk (03/12/2023)   Humiliation, Afraid, Rape, and Kick questionnaire    Fear of Current or Ex-Partner: No    Emotionally Abused: No    Physically Abused: No    Sexually Abused: No    FAMILY HISTORY:  Family History  Problem Relation Age of Onset   Cancer Father        throat   Diabetes Sister    Colon cancer Neg Hx     CURRENT MEDICATIONS:  Outpatient Encounter Medications as of 06/27/2023  Medication Sig   albuterol (PROVENTIL HFA) 108 (90 Base) MCG/ACT inhaler INHALE 2 PUFFS BY MOUTH EVERY 6 HOURS AS NEEDED FOR COUGHING, WHEEZING, OR SHORTNESS OF BREATH (Patient taking differently: Inhale 2 puffs into the lungs every 6 (six) hours as needed for shortness of breath or wheezing.)   aspirin EC 81 MG tablet Take 81 mg by mouth daily.   cyanocobalamin (VITAMIN B12) 1000 MCG/ML injection Inject 1,000 mcg into the  muscle every 30 (thirty) days.   feeding supplement (ENSURE ENLIVE / ENSURE PLUS) LIQD Take 237 mLs by mouth 3 (three) times daily between meals.   furosemide (LASIX) 20 MG tablet TAKE 2 TABLETS BY MOUTH EVERY MORNING AND 1 TABLET IN THE AFTERNOON (Patient taking differently: Take 20 mg by mouth See admin instructions. Take 40 mg in the morning and 20 mg every afternoon)   metoprolol tartrate (LOPRESSOR) 25 MG tablet Take 25  mg by mouth 2 (two) times daily.   mometasone-formoterol (DULERA) 200-5 MCG/ACT AERO Inhale 2 puffs into the lungs 2 (two) times daily.   ondansetron (ZOFRAN) 4 MG tablet Take 1 tablet (4 mg total) by mouth daily as needed for nausea or vomiting.   pantoprazole (PROTONIX) 40 MG tablet TAKE 1 TABLET(40 MG) BY MOUTH DAILY (Patient taking differently: Take 40 mg by mouth 2 (two) times daily.)   sodium bicarbonate 650 MG tablet Take 1 tablet (650 mg total) by mouth 3 (three) times daily.   spironolactone (ALDACTONE) 50 MG tablet TAKE 2 TABLETS BY MOUTH EVERY MORNING AND 1 TABLET DAILY IN THE AFTERNOON (Patient taking differently: Take 50 mg by mouth See admin instructions. Take 100 mg in the morning and 50 mg in the afternoon)   tamsulosin (FLOMAX) 0.4 MG CAPS capsule Take 1 capsule (0.4 mg total) by mouth daily after supper.   No facility-administered encounter medications on file as of 06/27/2023.    ALLERGIES:  No Known Allergies  LABORATORY DATA:  I have reviewed the labs as listed.  CBC    Component Value Date/Time   WBC 7.2 06/25/2023 1007   RBC 4.05 (L) 06/25/2023 1007   HGB 11.9 (L) 06/25/2023 1007   HCT 37.0 (L) 06/25/2023 1007   PLT 359 06/25/2023 1007   MCV 91.4 06/25/2023 1007   MCH 29.4 06/25/2023 1007   MCHC 32.2 06/25/2023 1007   RDW 15.1 06/25/2023 1007   LYMPHSABS 0.9 06/25/2023 1007   MONOABS 1.2 (H) 06/25/2023 1007   EOSABS 0.1 06/25/2023 1007   BASOSABS 0.1 06/25/2023 1007      Latest Ref Rng & Units 06/25/2023   10:07 AM 03/26/2023    9:54 AM  03/19/2023    1:59 PM  CMP  Glucose 70 - 99 mg/dL 91  016  010   BUN 6 - 20 mg/dL 23  49  43   Creatinine 0.61 - 1.24 mg/dL 9.32  3.55  7.32   Sodium 135 - 145 mmol/L 134  136  132   Potassium 3.5 - 5.1 mmol/L 4.5  4.1  4.6   Chloride 98 - 111 mmol/L 100  108  104   CO2 22 - 32 mmol/L 24  21  20    Calcium 8.9 - 10.3 mg/dL 8.6  8.2  7.9   Total Protein 6.5 - 8.1 g/dL 8.2  8.2  8.2   Total Bilirubin 0.0 - 1.2 mg/dL 0.6  0.5  0.6   Alkaline Phos 38 - 126 U/L 94  160  135   AST 15 - 41 U/L 32  38  29   ALT 0 - 44 U/L 9  27  32     DIAGNOSTIC IMAGING:  I have independently reviewed the relevant imaging and discussed with the patient.   WRAP UP:  All questions were answered. The patient knows to call the clinic with any problems, questions or concerns.  Medical decision making: ***  Time spent on visit: I spent *** minutes counseling the patient face to face. The total time spent in the appointment was *** minutes and more than 50% was on counseling.  Carnella Guadalajara, PA-C  ***

## 2023-06-27 ENCOUNTER — Inpatient Hospital Stay: Payer: Medicaid Other

## 2023-06-27 ENCOUNTER — Inpatient Hospital Stay (HOSPITAL_BASED_OUTPATIENT_CLINIC_OR_DEPARTMENT_OTHER): Payer: Medicaid Other | Admitting: Physician Assistant

## 2023-06-27 VITALS — BP 138/77 | HR 80 | Temp 98.3°F | Resp 16 | Wt 162.9 lb

## 2023-06-27 DIAGNOSIS — G63 Polyneuropathy in diseases classified elsewhere: Secondary | ICD-10-CM | POA: Diagnosis not present

## 2023-06-27 DIAGNOSIS — E531 Pyridoxine deficiency: Secondary | ICD-10-CM

## 2023-06-27 DIAGNOSIS — N1832 Chronic kidney disease, stage 3b: Secondary | ICD-10-CM | POA: Diagnosis not present

## 2023-06-27 DIAGNOSIS — R7989 Other specified abnormal findings of blood chemistry: Secondary | ICD-10-CM | POA: Diagnosis not present

## 2023-06-27 DIAGNOSIS — E538 Deficiency of other specified B group vitamins: Secondary | ICD-10-CM

## 2023-06-27 DIAGNOSIS — D631 Anemia in chronic kidney disease: Secondary | ICD-10-CM

## 2023-06-27 MED ORDER — CYANOCOBALAMIN 1000 MCG/ML IJ SOLN
1000.0000 ug | Freq: Once | INTRAMUSCULAR | Status: AC
Start: 2023-06-27 — End: 2023-06-27
  Administered 2023-06-27: 1000 ug via INTRAMUSCULAR
  Filled 2023-06-27: qty 1

## 2023-06-27 NOTE — Patient Instructions (Signed)
 [facility] [place] - [facility]  Discharge Instructions: Thank you for choosing [facility] to provide your oncology and hematology care.  If you have a lab appointment with the Cancer Center - please note that after April 8th, 2024, all labs will be drawn in the cancer center.  You do not have to check in or register with the main entrance as you have in the past but will complete your check-in in the cancer center.  Wear comfortable clothing and clothing appropriate for easy access to any Portacath or PICC line.   We strive to give you quality time with your provider. You may need to reschedule your appointment if you arrive late (15 or more minutes).  Arriving late affects you and other patients whose appointments are after yours.  Also, if you miss three or more appointments without notifying the office, you may be dismissed from the clinic at the provider's discretion.      For prescription refill requests, have your pharmacy contact our office and allow 72 hours for refills to be completed.    Today you received the following B12, return as scheduled.   To help prevent nausea and vomiting after your treatment, we encourage you to take your nausea medication as directed.  BELOW ARE SYMPTOMS THAT SHOULD BE REPORTED IMMEDIATELY: *FEVER GREATER THAN 100.4 F (38 C) OR HIGHER *CHILLS OR SWEATING *NAUSEA AND VOMITING THAT IS NOT CONTROLLED WITH YOUR NAUSEA MEDICATION *UNUSUAL SHORTNESS OF BREATH *UNUSUAL BRUISING OR BLEEDING *URINARY PROBLEMS (pain or burning when urinating, or frequent urination) *BOWEL PROBLEMS (unusual diarrhea, constipation, pain near the anus) TENDERNESS IN MOUTH AND THROAT WITH OR WITHOUT PRESENCE OF ULCERS (sore throat, sores in mouth, or a toothache) UNUSUAL RASH, SWELLING OR PAIN  UNUSUAL VAGINAL DISCHARGE OR ITCHING   Items with * indicate a potential emergency and should be followed up as soon as possible or go  to the Emergency Department if any problems should occur.  Please show the CHEMOTHERAPY ALERT CARD or IMMUNOTHERAPY ALERT CARD at check-in to the Emergency Department and triage nurse.  Should you have questions after your visit or need to cancel or reschedule your appointment, please contact [facility] [place] - A DEPT OF Eligha Bridegroom [facility] [phone]  and follow the prompts.  Office hours are 8:00 a.m. to 4:30 p.m. Monday - Friday. Please note that voicemails left after 4:00 p.m. may not be returned until the following business day.  We are closed weekends and major holidays. You have access to a nurse at all times for urgent questions. Please call the main number to the clinic [phone] and follow the prompts.  For any non-urgent questions, you may also contact your provider using MyChart. We now offer e-Visits for anyone 8 and older to request care online for non-urgent symptoms. For details visit [email or web address].   Also download the MyChart app! Go to the app store, search "MyChart", open the app, select [place], and log in with your MyChart username and password.

## 2023-06-27 NOTE — Progress Notes (Signed)
Patient tolerated B12 injection with no complaints voiced. Site clean and dry with no bruising or swelling noted at site. See MAR for details. Band aid applied.  Patient stable during and after injection. VSS with discharge and left in satisfactory condition with no s/s of distress noted. 

## 2023-06-27 NOTE — Patient Instructions (Signed)
 [facility] at [facility] Discharge Instructions  You were seen today by Rojelio Brenner PA-C for your anemia and your high iron levels.  ANEMIA: Your anemia (low blood levels) related to your chronic kidney disease and your vitamin deficiencies. Your blood levels look much better! Continue monthly vitamin B12 injections. Continue folic acid 400 mcg every day.  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  Your iron levels look much better after you stop drinking alcohol - keep up the great work!  FOLLOW-UP APPOINTMENT: Repeat labs in 4 months with office visit the week after labs  ** Thank you for trusting me with your healthcare!  I strive to provide all of my patients with quality care at each visit.  If you receive a survey for this visit, I would be so grateful to you for taking the time to provide feedback.  Thank you in advance!  ~ Brindle Leyba                   Dr. Doreatha Massed   &   Rojelio Brenner, PA-C   - - - - - - - - - - - - - - - - - -     Thank you for choosing  [facility] to provide your oncology and hematology care.  To afford each patient quality time with our provider, please arrive at least 15 minutes before your scheduled appointment time.   If you have a lab appointment with the Cancer Center please come in thru the Main Entrance and check in at the main information desk.  You need to re-schedule your appointment should you arrive 10 or more minutes late.  We strive to give you quality time with our providers, and arriving late affects you and other patients whose appointments are after yours.  Also, if you no show three or more times for appointments you may be dismissed from the clinic at the providers discretion.     Again, thank you for choosing [facility]  Our hope is that these requests will decrease the amount of time that  you wait before being seen by our physicians.       _____________________________________________________________  Should you have questions after your visit to [facility], please contact our office at [phone] and follow the prompts.  Our office hours are 8:00 a.m. and 4:30 p.m. Monday - Friday.  Please note that voicemails left after 4:00 p.m. may not be returned until the following business day.  We are closed weekends and major holidays.  You do have access to a nurse 24-7, just call the main number to the clinic [phone] and do not press any options, hold on the line and a nurse will answer the phone.    For prescription refill requests, have your pharmacy contact our office and allow 72 hours.    Due to Covid, you will need to wear a mask upon entering the hospital. If you do not have a mask, a mask will be given to you at the Main Entrance upon arrival. For doctor visits, patients may have 1 support person age 58 or older with them. For treatment visits, patients can not have anyone with them due to social distancing guidelines and our immunocompromised population.

## 2023-07-05 ENCOUNTER — Encounter (HOSPITAL_COMMUNITY): Payer: Self-pay | Admitting: Emergency Medicine

## 2023-07-05 ENCOUNTER — Emergency Department (HOSPITAL_COMMUNITY)

## 2023-07-05 ENCOUNTER — Other Ambulatory Visit: Payer: Self-pay

## 2023-07-05 ENCOUNTER — Inpatient Hospital Stay (HOSPITAL_COMMUNITY)
Admission: EM | Admit: 2023-07-05 | Discharge: 2023-07-31 | DRG: 438 | Disposition: E | Attending: Internal Medicine | Admitting: Internal Medicine

## 2023-07-05 DIAGNOSIS — Z7189 Other specified counseling: Secondary | ICD-10-CM

## 2023-07-05 DIAGNOSIS — R739 Hyperglycemia, unspecified: Secondary | ICD-10-CM | POA: Diagnosis not present

## 2023-07-05 DIAGNOSIS — Z9049 Acquired absence of other specified parts of digestive tract: Secondary | ICD-10-CM

## 2023-07-05 DIAGNOSIS — E875 Hyperkalemia: Secondary | ICD-10-CM | POA: Diagnosis present

## 2023-07-05 DIAGNOSIS — K219 Gastro-esophageal reflux disease without esophagitis: Secondary | ICD-10-CM | POA: Diagnosis present

## 2023-07-05 DIAGNOSIS — R579 Shock, unspecified: Secondary | ICD-10-CM | POA: Diagnosis not present

## 2023-07-05 DIAGNOSIS — J90 Pleural effusion, not elsewhere classified: Secondary | ICD-10-CM | POA: Diagnosis present

## 2023-07-05 DIAGNOSIS — A419 Sepsis, unspecified organism: Secondary | ICD-10-CM

## 2023-07-05 DIAGNOSIS — J189 Pneumonia, unspecified organism: Secondary | ICD-10-CM | POA: Diagnosis not present

## 2023-07-05 DIAGNOSIS — K7040 Alcoholic hepatic failure without coma: Secondary | ICD-10-CM | POA: Diagnosis present

## 2023-07-05 DIAGNOSIS — Z66 Do not resuscitate: Secondary | ICD-10-CM | POA: Diagnosis not present

## 2023-07-05 DIAGNOSIS — Z79899 Other long term (current) drug therapy: Secondary | ICD-10-CM

## 2023-07-05 DIAGNOSIS — Z860100 Personal history of colon polyps, unspecified: Secondary | ICD-10-CM

## 2023-07-05 DIAGNOSIS — R748 Abnormal levels of other serum enzymes: Secondary | ICD-10-CM | POA: Diagnosis not present

## 2023-07-05 DIAGNOSIS — K767 Hepatorenal syndrome: Secondary | ICD-10-CM | POA: Diagnosis not present

## 2023-07-05 DIAGNOSIS — K851 Biliary acute pancreatitis without necrosis or infection: Secondary | ICD-10-CM | POA: Diagnosis present

## 2023-07-05 DIAGNOSIS — J440 Chronic obstructive pulmonary disease with acute lower respiratory infection: Secondary | ICD-10-CM | POA: Diagnosis not present

## 2023-07-05 DIAGNOSIS — Z781 Physical restraint status: Secondary | ICD-10-CM

## 2023-07-05 DIAGNOSIS — R569 Unspecified convulsions: Secondary | ICD-10-CM | POA: Diagnosis present

## 2023-07-05 DIAGNOSIS — E8809 Other disorders of plasma-protein metabolism, not elsewhere classified: Secondary | ICD-10-CM | POA: Diagnosis not present

## 2023-07-05 DIAGNOSIS — E861 Hypovolemia: Secondary | ICD-10-CM | POA: Diagnosis not present

## 2023-07-05 DIAGNOSIS — K859 Acute pancreatitis without necrosis or infection, unspecified: Secondary | ICD-10-CM | POA: Diagnosis not present

## 2023-07-05 DIAGNOSIS — D689 Coagulation defect, unspecified: Secondary | ICD-10-CM | POA: Diagnosis not present

## 2023-07-05 DIAGNOSIS — N1832 Chronic kidney disease, stage 3b: Secondary | ICD-10-CM | POA: Diagnosis present

## 2023-07-05 DIAGNOSIS — I10 Essential (primary) hypertension: Secondary | ICD-10-CM | POA: Diagnosis not present

## 2023-07-05 DIAGNOSIS — K7460 Unspecified cirrhosis of liver: Secondary | ICD-10-CM

## 2023-07-05 DIAGNOSIS — N170 Acute kidney failure with tubular necrosis: Secondary | ICD-10-CM | POA: Diagnosis present

## 2023-07-05 DIAGNOSIS — D638 Anemia in other chronic diseases classified elsewhere: Secondary | ICD-10-CM | POA: Diagnosis present

## 2023-07-05 DIAGNOSIS — Z992 Dependence on renal dialysis: Secondary | ICD-10-CM

## 2023-07-05 DIAGNOSIS — R1084 Generalized abdominal pain: Principal | ICD-10-CM

## 2023-07-05 DIAGNOSIS — R638 Other symptoms and signs concerning food and fluid intake: Secondary | ICD-10-CM | POA: Diagnosis not present

## 2023-07-05 DIAGNOSIS — R1030 Lower abdominal pain, unspecified: Secondary | ICD-10-CM | POA: Diagnosis present

## 2023-07-05 DIAGNOSIS — F1090 Alcohol use, unspecified, uncomplicated: Secondary | ICD-10-CM | POA: Diagnosis not present

## 2023-07-05 DIAGNOSIS — K7682 Hepatic encephalopathy: Secondary | ICD-10-CM | POA: Diagnosis not present

## 2023-07-05 DIAGNOSIS — Z789 Other specified health status: Secondary | ICD-10-CM | POA: Diagnosis not present

## 2023-07-05 DIAGNOSIS — G8929 Other chronic pain: Secondary | ICD-10-CM | POA: Diagnosis present

## 2023-07-05 DIAGNOSIS — D6489 Other specified anemias: Secondary | ICD-10-CM | POA: Diagnosis not present

## 2023-07-05 DIAGNOSIS — K852 Alcohol induced acute pancreatitis without necrosis or infection: Secondary | ICD-10-CM | POA: Diagnosis present

## 2023-07-05 DIAGNOSIS — N179 Acute kidney failure, unspecified: Secondary | ICD-10-CM | POA: Diagnosis not present

## 2023-07-05 DIAGNOSIS — Z7951 Long term (current) use of inhaled steroids: Secondary | ICD-10-CM

## 2023-07-05 DIAGNOSIS — E8770 Fluid overload, unspecified: Secondary | ICD-10-CM | POA: Diagnosis not present

## 2023-07-05 DIAGNOSIS — Z6824 Body mass index (BMI) 24.0-24.9, adult: Secondary | ICD-10-CM

## 2023-07-05 DIAGNOSIS — F1010 Alcohol abuse, uncomplicated: Secondary | ICD-10-CM | POA: Diagnosis present

## 2023-07-05 DIAGNOSIS — K7030 Alcoholic cirrhosis of liver without ascites: Secondary | ICD-10-CM | POA: Diagnosis present

## 2023-07-05 DIAGNOSIS — I129 Hypertensive chronic kidney disease with stage 1 through stage 4 chronic kidney disease, or unspecified chronic kidney disease: Secondary | ICD-10-CM | POA: Diagnosis present

## 2023-07-05 DIAGNOSIS — G9340 Encephalopathy, unspecified: Secondary | ICD-10-CM | POA: Diagnosis not present

## 2023-07-05 DIAGNOSIS — J9601 Acute respiratory failure with hypoxia: Secondary | ICD-10-CM | POA: Diagnosis not present

## 2023-07-05 DIAGNOSIS — E872 Acidosis, unspecified: Secondary | ICD-10-CM | POA: Diagnosis present

## 2023-07-05 DIAGNOSIS — N189 Chronic kidney disease, unspecified: Secondary | ICD-10-CM | POA: Diagnosis not present

## 2023-07-05 DIAGNOSIS — E869 Volume depletion, unspecified: Secondary | ICD-10-CM | POA: Diagnosis not present

## 2023-07-05 DIAGNOSIS — Z515 Encounter for palliative care: Secondary | ICD-10-CM

## 2023-07-05 DIAGNOSIS — D684 Acquired coagulation factor deficiency: Secondary | ICD-10-CM | POA: Diagnosis not present

## 2023-07-05 DIAGNOSIS — E7800 Pure hypercholesterolemia, unspecified: Secondary | ICD-10-CM | POA: Diagnosis present

## 2023-07-05 DIAGNOSIS — Z833 Family history of diabetes mellitus: Secondary | ICD-10-CM

## 2023-07-05 DIAGNOSIS — R400 Somnolence: Secondary | ICD-10-CM

## 2023-07-05 DIAGNOSIS — Z711 Person with feared health complaint in whom no diagnosis is made: Secondary | ICD-10-CM | POA: Diagnosis not present

## 2023-07-05 DIAGNOSIS — R578 Other shock: Secondary | ICD-10-CM | POA: Diagnosis not present

## 2023-07-05 DIAGNOSIS — D6959 Other secondary thrombocytopenia: Secondary | ICD-10-CM | POA: Diagnosis present

## 2023-07-05 DIAGNOSIS — E876 Hypokalemia: Secondary | ICD-10-CM | POA: Diagnosis not present

## 2023-07-05 DIAGNOSIS — E43 Unspecified severe protein-calorie malnutrition: Secondary | ICD-10-CM | POA: Diagnosis present

## 2023-07-05 DIAGNOSIS — Z7982 Long term (current) use of aspirin: Secondary | ICD-10-CM

## 2023-07-05 DIAGNOSIS — R54 Age-related physical debility: Secondary | ICD-10-CM | POA: Diagnosis present

## 2023-07-05 DIAGNOSIS — J690 Pneumonitis due to inhalation of food and vomit: Secondary | ICD-10-CM | POA: Diagnosis not present

## 2023-07-05 DIAGNOSIS — K8020 Calculus of gallbladder without cholecystitis without obstruction: Secondary | ICD-10-CM | POA: Diagnosis present

## 2023-07-05 DIAGNOSIS — Z809 Family history of malignant neoplasm, unspecified: Secondary | ICD-10-CM

## 2023-07-05 DIAGNOSIS — J449 Chronic obstructive pulmonary disease, unspecified: Secondary | ICD-10-CM | POA: Diagnosis not present

## 2023-07-05 DIAGNOSIS — K5730 Diverticulosis of large intestine without perforation or abscess without bleeding: Secondary | ICD-10-CM | POA: Diagnosis present

## 2023-07-05 DIAGNOSIS — K7200 Acute and subacute hepatic failure without coma: Secondary | ICD-10-CM | POA: Diagnosis not present

## 2023-07-05 DIAGNOSIS — D696 Thrombocytopenia, unspecified: Secondary | ICD-10-CM | POA: Diagnosis not present

## 2023-07-05 DIAGNOSIS — R6521 Severe sepsis with septic shock: Secondary | ICD-10-CM | POA: Diagnosis not present

## 2023-07-05 DIAGNOSIS — K8521 Alcohol induced acute pancreatitis with uninfected necrosis: Secondary | ICD-10-CM | POA: Diagnosis not present

## 2023-07-05 DIAGNOSIS — E162 Hypoglycemia, unspecified: Secondary | ICD-10-CM | POA: Diagnosis not present

## 2023-07-05 DIAGNOSIS — Z86718 Personal history of other venous thrombosis and embolism: Secondary | ICD-10-CM

## 2023-07-05 DIAGNOSIS — K683 Retroperitoneal hematoma: Secondary | ICD-10-CM | POA: Diagnosis not present

## 2023-07-05 DIAGNOSIS — E2740 Unspecified adrenocortical insufficiency: Secondary | ICD-10-CM | POA: Diagnosis present

## 2023-07-05 DIAGNOSIS — K7031 Alcoholic cirrhosis of liver with ascites: Secondary | ICD-10-CM | POA: Diagnosis not present

## 2023-07-05 DIAGNOSIS — J9811 Atelectasis: Secondary | ICD-10-CM | POA: Diagnosis present

## 2023-07-05 DIAGNOSIS — Z87442 Personal history of urinary calculi: Secondary | ICD-10-CM

## 2023-07-05 DIAGNOSIS — Z800 Family history of malignant neoplasm of digestive organs: Secondary | ICD-10-CM

## 2023-07-05 DIAGNOSIS — E871 Hypo-osmolality and hyponatremia: Secondary | ICD-10-CM | POA: Diagnosis present

## 2023-07-05 DIAGNOSIS — G9341 Metabolic encephalopathy: Secondary | ICD-10-CM | POA: Diagnosis not present

## 2023-07-05 DIAGNOSIS — R7401 Elevation of levels of liver transaminase levels: Secondary | ICD-10-CM | POA: Diagnosis not present

## 2023-07-05 DIAGNOSIS — Z5986 Financial insecurity: Secondary | ICD-10-CM

## 2023-07-05 DIAGNOSIS — K429 Umbilical hernia without obstruction or gangrene: Secondary | ICD-10-CM | POA: Diagnosis present

## 2023-07-05 DIAGNOSIS — F17210 Nicotine dependence, cigarettes, uncomplicated: Secondary | ICD-10-CM | POA: Diagnosis present

## 2023-07-05 LAB — COMPREHENSIVE METABOLIC PANEL
ALT: 728 U/L — ABNORMAL HIGH (ref 0–44)
AST: 2211 U/L — ABNORMAL HIGH (ref 15–41)
Albumin: 2.8 g/dL — ABNORMAL LOW (ref 3.5–5.0)
Alkaline Phosphatase: 103 U/L (ref 38–126)
Anion gap: 11 (ref 5–15)
BUN: 36 mg/dL — ABNORMAL HIGH (ref 6–20)
CO2: 21 mmol/L — ABNORMAL LOW (ref 22–32)
Calcium: 8.4 mg/dL — ABNORMAL LOW (ref 8.9–10.3)
Chloride: 97 mmol/L — ABNORMAL LOW (ref 98–111)
Creatinine, Ser: 3.63 mg/dL — ABNORMAL HIGH (ref 0.61–1.24)
GFR, Estimated: 19 mL/min — ABNORMAL LOW (ref >60)
Glucose, Bld: 106 mg/dL — ABNORMAL HIGH (ref 70–99)
Potassium: 5.4 mmol/L — ABNORMAL HIGH (ref 3.5–5.1)
Sodium: 129 mmol/L — ABNORMAL LOW (ref 135–145)
Total Bilirubin: 3 mg/dL — ABNORMAL HIGH (ref 0.0–1.2)
Total Protein: 8.8 g/dL — ABNORMAL HIGH (ref 6.5–8.1)

## 2023-07-05 LAB — CBC
HCT: 44.1 % (ref 39.0–52.0)
Hemoglobin: 14.1 g/dL (ref 13.0–17.0)
MCH: 29.3 pg (ref 26.0–34.0)
MCHC: 32 g/dL (ref 30.0–36.0)
MCV: 91.5 fL (ref 80.0–100.0)
Platelets: 225 10*3/uL (ref 150–400)
RBC: 4.82 MIL/uL (ref 4.22–5.81)
RDW: 15.6 % — ABNORMAL HIGH (ref 11.5–15.5)
WBC: 8 10*3/uL (ref 4.0–10.5)
nRBC: 0 % (ref 0.0–0.2)

## 2023-07-05 LAB — LIPASE, BLOOD: Lipase: 843 U/L — ABNORMAL HIGH (ref 11–51)

## 2023-07-05 LAB — HEPATITIS PANEL, ACUTE
HCV Ab: NONREACTIVE
Hep A IgM: NONREACTIVE
Hep B C IgM: NONREACTIVE
Hepatitis B Surface Ag: NONREACTIVE

## 2023-07-05 LAB — PROTIME-INR
INR: 2.4 — ABNORMAL HIGH (ref 0.8–1.2)
Prothrombin Time: 26 s — ABNORMAL HIGH (ref 11.4–15.2)

## 2023-07-05 MED ORDER — TAMSULOSIN HCL 0.4 MG PO CAPS
0.4000 mg | ORAL_CAPSULE | Freq: Every day | ORAL | Status: DC
  Administered 2023-07-05 – 2023-07-07 (×3): 0.4 mg via ORAL
  Filled 2023-07-05 (×4): qty 1

## 2023-07-05 MED ORDER — ASPIRIN 81 MG PO TBEC
81.0000 mg | DELAYED_RELEASE_TABLET | Freq: Every day | ORAL | Status: DC
  Administered 2023-07-05: 81 mg via ORAL
  Filled 2023-07-05 (×2): qty 1

## 2023-07-05 MED ORDER — SODIUM BICARBONATE 650 MG PO TABS
650.0000 mg | ORAL_TABLET | Freq: Three times a day (TID) | ORAL | Status: DC
  Administered 2023-07-05 – 2023-07-07 (×7): 650 mg via ORAL
  Filled 2023-07-05 (×7): qty 1

## 2023-07-05 MED ORDER — HYDROMORPHONE HCL 1 MG/ML IJ SOLN
0.5000 mg | INTRAMUSCULAR | Status: DC | PRN
  Administered 2023-07-05 – 2023-07-06 (×2): 1 mg via INTRAVENOUS
  Administered 2023-07-06: 0.5 mg via INTRAVENOUS
  Filled 2023-07-05 (×3): qty 1

## 2023-07-05 MED ORDER — SODIUM CHLORIDE 0.9 % IV SOLN: INTRAVENOUS | Status: DC

## 2023-07-05 MED ORDER — MORPHINE SULFATE (PF) 2 MG/ML IV SOLN
2.0000 mg | Freq: Once | INTRAVENOUS | Status: AC
  Administered 2023-07-05: 2 mg via INTRAVENOUS
  Filled 2023-07-05: qty 1

## 2023-07-05 MED ORDER — SODIUM ZIRCONIUM CYCLOSILICATE 5 G PO PACK
10.0000 g | PACK | Freq: Once | ORAL | Status: AC
  Administered 2023-07-05: 10 g via ORAL
  Filled 2023-07-05: qty 2

## 2023-07-05 MED ORDER — ONDANSETRON HCL 4 MG/2ML IJ SOLN
4.0000 mg | Freq: Four times a day (QID) | INTRAMUSCULAR | Status: DC | PRN
  Administered 2023-07-06: 4 mg via INTRAVENOUS
  Filled 2023-07-05: qty 2

## 2023-07-05 MED ORDER — MOMETASONE FURO-FORMOTEROL FUM 200-5 MCG/ACT IN AERO
2.0000 | INHALATION_SPRAY | Freq: Two times a day (BID) | RESPIRATORY_TRACT | Status: DC
  Administered 2023-07-06 – 2023-07-07 (×3): 2 via RESPIRATORY_TRACT
  Filled 2023-07-05 (×2): qty 8.8

## 2023-07-05 MED ORDER — HEPARIN SODIUM (PORCINE) 5000 UNIT/ML IJ SOLN
5000.0000 [IU] | Freq: Three times a day (TID) | INTRAMUSCULAR | Status: DC
  Administered 2023-07-05 – 2023-07-07 (×4): 5000 [IU] via SUBCUTANEOUS
  Filled 2023-07-05 (×5): qty 1

## 2023-07-05 MED ORDER — ONDANSETRON HCL 4 MG PO TABS: 4.0000 mg | ORAL_TABLET | Freq: Four times a day (QID) | ORAL | Status: DC | PRN

## 2023-07-05 MED ORDER — SODIUM CHLORIDE 0.9 % IV BOLUS
1000.0000 mL | Freq: Once | INTRAVENOUS | Status: AC
  Administered 2023-07-05: 1000 mL via INTRAVENOUS

## 2023-07-05 MED ORDER — ACETAMINOPHEN 650 MG RE SUPP: 650.0000 mg | Freq: Four times a day (QID) | RECTAL | Status: DC | PRN

## 2023-07-05 MED ORDER — ONDANSETRON HCL 4 MG/2ML IJ SOLN
4.0000 mg | Freq: Once | INTRAMUSCULAR | Status: AC
  Administered 2023-07-05: 4 mg via INTRAVENOUS
  Filled 2023-07-05: qty 2

## 2023-07-05 MED ORDER — ACETAMINOPHEN 325 MG PO TABS: 650.0000 mg | ORAL_TABLET | Freq: Four times a day (QID) | ORAL | Status: DC | PRN

## 2023-07-05 MED ORDER — ALBUMIN HUMAN 25 % IV SOLN
12.5000 g | Freq: Once | INTRAVENOUS | Status: AC
  Administered 2023-07-06: 12.5 g via INTRAVENOUS
  Filled 2023-07-05: qty 50

## 2023-07-05 MED ORDER — MORPHINE SULFATE (PF) 4 MG/ML IV SOLN
4.0000 mg | Freq: Once | INTRAVENOUS | Status: AC
  Administered 2023-07-05: 4 mg via INTRAVENOUS
  Filled 2023-07-05: qty 1

## 2023-07-05 NOTE — ED Triage Notes (Signed)
 Pt arrives to triage via EMS. Pt c/o lower abdominal pain that has been progressing since Sunday. Pt endorses N/V and diarrhea.  Pt states that he hasn't been able to tolerate PO intake.   Hx of Cirrhosis and pancreatitis.

## 2023-07-05 NOTE — Consult Note (Signed)
 Gastroenterology Consult   Referring Provider: No ref. provider found Primary Care Physician:  Benetta Spar, MD Primary Gastroenterologist:  Roetta Sessions, MD   Patient ID: [name] [name]; [id number]; 28-Sep-1965   Admit date: 07/05/2023  LOS: 0 days   Date of Consultation: 07/05/2023  Reason for Consultation:  acute pancreatitis/transaminitis    History of Present Illness   [name] [name] is a 58 y.o. male with history of hypertension, B12 deficiency, prior left lower extremity DVT, asthma, chronic kidney disease, COPD, kidney stones, cirrhosis due to EtOH, followed [facility] [place] [facility], FNP), pancreatitis due to alcohol and gallstones January 2024 (evaluated by [facility] surgery and felt not to be a candidate in light of cirrhosis as risk outweighed benefits), gallbladder polyp, chronically elevated ferritin with hemosiderosis, H63D heterozygous (followed by hematology elevated ferritin thought to be secondary to alcohol consumption and disease rather than H63D heterozygosity) presenting to the ED with complaints of abdominal pain associated with vomiting, diarrhea beginning 3 to 4 days ago.  Patient reporting his last alcohol use was on February 8.  Last admission for pancreatitis back in November 2024.  In the ED: Lipase 843, sodium 129, potassium 5.4, creatinine 3.63, BUN 36, albumin 2.8, total bilirubin 3, alkaline phosphatase 103, AST 2211, ALT 728.  Notably LFTs were normal 10 days ago.  Hemoglobin 14.1, white blood cell count 8000, platelets 225,000, INR 2.4.  CT abdomen pelvis without contrast today showing pancreas to be diffusely ill-defined through the tail region with subtle peripancreatic edema/inflammation compatible with acute pancreatitis, suggesting follow-up imaging to rule out underlying pancreatic mass.  Also with cholelithiasis, atrophic left kidney, hepatic cirrhosis.  Limited ultrasound abdomen with liver Doppler today showing  preserved hepatic vasculature with appropriate directional flow.  Relative increased flow along the [facility] artery.  No thrombus noted.  GI consult: Patient states that he was doing well, no alcohol use since his last admission however on his birthday he did drink alcohol.  He denies any alcohol since then.  Several days ago started having abdominal pain mostly left upper quadrant with vomiting, poor appetite, some loose stools.  No bowel movement since yesterday.  No melena or rectal bleeding.  No heartburn.  No hematemesis.  No fever.  EGD May 2023 with normal esophagus but retained food in stomach and duodenum precluded exam. EGD variceal screening due May 2025   Colonoscopy Dec 2023: pancolonic diverticulosis, multiple polyps, one which was 11 mm (tubular adenomas). Surveillance in 3 years.   Prior to Admission medications   Medication Sig Start Date End Date Taking? Authorizing Provider  albuterol (PROVENTIL HFA) 108 (90 Base) MCG/ACT inhaler INHALE 2 PUFFS BY MOUTH EVERY 6 HOURS AS NEEDED FOR COUGHING, WHEEZING, OR SHORTNESS OF BREATH Patient taking differently: Inhale 2 puffs into the lungs every 6 (six) hours as needed for shortness of breath or wheezing. 02/10/21   Shon Hale, MD  aspirin EC 81 MG tablet Take 81 mg by mouth daily.    [provider]  atorvastatin (LIPITOR) 20 MG tablet Take 20 mg by mouth daily. 05/23/23   [provider]  cyanocobalamin (VITAMIN B12) 1000 MCG/ML injection Inject 1,000 mcg into the muscle every 30 (thirty) days.    [provider]  feeding supplement (ENSURE ENLIVE / ENSURE PLUS) LIQD Take 237 mLs by mouth 3 (three) times daily between meals. 03/14/23   Sherryll Burger, Pratik D, DO  furosemide (LASIX) 20 MG tablet TAKE 2 TABLETS BY MOUTH EVERY MORNING  AND 1 TABLET IN THE AFTERNOON Patient taking differently: Take 20 mg by mouth See admin instructions. Take 40 mg in the morning and 20 mg every afternoon 01/12/23   Gelene Mink, NP   metoprolol tartrate (LOPRESSOR) 25 MG tablet Take 25 mg by mouth 2 (two) times daily. 05/24/21   [provider]  mometasone-formoterol (DULERA) 200-5 MCG/ACT AERO Inhale 2 puffs into the lungs 2 (two) times daily. 02/10/21   Shon Hale, MD  ondansetron (ZOFRAN) 4 MG tablet Take 1 tablet (4 mg total) by mouth daily as needed for nausea or vomiting. 03/14/23 03/13/24  Sherryll Burger, Pratik D, DO  pantoprazole (PROTONIX) 40 MG tablet TAKE 1 TABLET(40 MG) BY MOUTH DAILY Patient taking differently: Take 40 mg by mouth 2 (two) times daily. 09/18/22   Gelene Mink, NP  sodium bicarbonate 650 MG tablet Take 1 tablet (650 mg total) by mouth 3 (three) times daily. 09/25/22   Vassie Loll, MD  spironolactone (ALDACTONE) 50 MG tablet TAKE 2 TABLETS BY MOUTH EVERY MORNING AND 1 TABLET DAILY IN THE AFTERNOON Patient taking differently: Take 50 mg by mouth See admin instructions. Take 100 mg in the morning and 50 mg in the afternoon 12/28/22   Gelene Mink, NP  tamsulosin (FLOMAX) 0.4 MG CAPS capsule Take 1 capsule (0.4 mg total) by mouth daily after supper. 05/10/23   Gelene Mink, NP    Current Facility-Administered Medications  Medication Dose Route Frequency Provider Last Rate Last Admin   0.9 %  sodium chloride infusion   Intravenous Continuous Sherryll Burger, Pratik D, DO       acetaminophen (TYLENOL) tablet 650 mg  650 mg Oral Q6H PRN Sherryll Burger, Pratik D, DO       Or   acetaminophen (TYLENOL) suppository 650 mg  650 mg Rectal Q6H PRN Sherryll Burger, Pratik D, DO       aspirin EC tablet 81 mg  81 mg Oral Daily Shah, Pratik D, DO       heparin injection 5,000 Units  5,000 Units Subcutaneous Q8H Shah, Pratik D, DO       HYDROmorphone (DILAUDID) injection 0.5-1 mg  0.5-1 mg Intravenous Q2H PRN Sherryll Burger, Pratik D, DO       mometasone-formoterol (DULERA) 200-5 MCG/ACT inhaler 2 puff  2 puff Inhalation BID Sherryll Burger, Pratik D, DO       ondansetron (ZOFRAN) tablet 4 mg  4 mg Oral Q6H PRN Sherryll Burger, Pratik D, DO       Or   ondansetron  (ZOFRAN) injection 4 mg  4 mg Intravenous Q6H PRN Sherryll Burger, Pratik D, DO       sodium bicarbonate tablet 650 mg  650 mg Oral TID Sherryll Burger, Pratik D, DO       tamsulosin (FLOMAX) capsule 0.4 mg  0.4 mg Oral QPC supper Sherryll Burger, Pratik D, DO       Current Outpatient Medications  Medication Sig Dispense Refill   albuterol (PROVENTIL HFA) 108 (90 Base) MCG/ACT inhaler INHALE 2 PUFFS BY MOUTH EVERY 6 HOURS AS NEEDED FOR COUGHING, WHEEZING, OR SHORTNESS OF BREATH (Patient taking differently: Inhale 2 puffs into the lungs every 6 (six) hours as needed for shortness of breath or wheezing.) 18 g 1   aspirin EC 81 MG tablet Take 81 mg by mouth daily.     atorvastatin (LIPITOR) 20 MG tablet Take 20 mg by mouth daily.     cyanocobalamin (VITAMIN B12) 1000 MCG/ML injection Inject 1,000 mcg into the muscle every 30 (thirty) days.  feeding supplement (ENSURE ENLIVE / ENSURE PLUS) LIQD Take 237 mLs by mouth 3 (three) times daily between meals. 237 mL 12   furosemide (LASIX) 20 MG tablet TAKE 2 TABLETS BY MOUTH EVERY MORNING AND 1 TABLET IN THE AFTERNOON (Patient taking differently: Take 20 mg by mouth See admin instructions. Take 40 mg in the morning and 20 mg every afternoon) 90 tablet 3   metoprolol tartrate (LOPRESSOR) 25 MG tablet Take 25 mg by mouth 2 (two) times daily.     mometasone-formoterol (DULERA) 200-5 MCG/ACT AERO Inhale 2 puffs into the lungs 2 (two) times daily. 13 g 3   ondansetron (ZOFRAN) 4 MG tablet Take 1 tablet (4 mg total) by mouth daily as needed for nausea or vomiting. 30 tablet 1   pantoprazole (PROTONIX) 40 MG tablet TAKE 1 TABLET(40 MG) BY MOUTH DAILY (Patient taking differently: Take 40 mg by mouth 2 (two) times daily.) 90 tablet 3   sodium bicarbonate 650 MG tablet Take 1 tablet (650 mg total) by mouth 3 (three) times daily. 90 tablet 1   spironolactone (ALDACTONE) 50 MG tablet TAKE 2 TABLETS BY MOUTH EVERY MORNING AND 1 TABLET DAILY IN THE AFTERNOON (Patient taking differently: Take 50 mg by  mouth See admin instructions. Take 100 mg in the morning and 50 mg in the afternoon) 180 tablet 2   tamsulosin (FLOMAX) 0.4 MG CAPS capsule Take 1 capsule (0.4 mg total) by mouth daily after supper. 30 capsule 2    Allergies as of 07/05/2023   (No Known Allergies)    Past Medical History:  Diagnosis Date   Asthma    B12 deficiency 03/15/2022   Cirrhosis (HCC)    Dyspnea    High cholesterol    History of kidney stones    Hypertension    Kidney stones     Past Surgical History:  Procedure Laterality Date   APPENDECTOMY     BIOPSY  07/22/2020   Procedure: BIOPSY;  Surgeon: Corbin Ade, MD;  [facility];  Service: Endoscopy;;   COLONOSCOPY WITH PROPOFOL N/A 04/18/2017   non-bleeding internal hemorrhoids, two 4-6 mm polyps in descending colon and cecum, pancolonic diverticulosis, single cecal AVM. Tubular adenomas, surveillance in 2023.    COLONOSCOPY WITH PROPOFOL N/A 04/13/2022   pancolonic diverticulosis, multiple polyps, one which was 11 mm (tubular adenomas). Surveillance in 3 years.   ESOPHAGOGASTRODUODENOSCOPY (EGD) WITH PROPOFOL N/A 07/22/2020   normal esophagus, small hiatal hernia, abnormal gastric mucosa, abnormal appearing ampula and periampullary mucosa. Mild chronic gastritis.   ESOPHAGOGASTRODUODENOSCOPY (EGD) WITH PROPOFOL N/A 09/08/2021   normal esophagus, retained food in stomach and duodenum precluded complete examination.   FRACTURE SURGERY     left arm   IR PARACENTESIS  05/31/2021   LOWER EXTREMITY VENOGRAPHY N/A 08/10/2020   Procedure: LOWER EXTREMITY VENOGRAPHY;  Surgeon: Maeola Harman, MD;  Location: [facility] INVASIVE CV LAB;  Service: Cardiovascular;  Laterality: N/A;   PERIPHERAL VASCULAR BALLOON ANGIOPLASTY Left 08/10/2020   Procedure: PERIPHERAL VASCULAR BALLOON ANGIOPLASTY;  Surgeon: Maeola Harman, MD;  Location: [facility] INVASIVE CV LAB;  Service: Cardiovascular;  Laterality: Left;  lower extremity venous   PERIPHERAL  VASCULAR THROMBECTOMY N/A 08/10/2020   Procedure: PERIPHERAL VASCULAR THROMBECTOMY;  Surgeon: Maeola Harman, MD;  Location: [facility] INVASIVE CV LAB;  Service: Cardiovascular;  Laterality: N/A;   POLYPECTOMY  04/18/2017   Procedure: POLYPECTOMY;  Surgeon: Corbin Ade, MD;  [facility];  Service: Endoscopy;;   POLYPECTOMY  04/13/2022  Procedure: POLYPECTOMY;  Surgeon: Corbin Ade, MD;  [facility];  Service: Endoscopy;;    Family History  Problem Relation Age of Onset   Cancer Father        throat   Diabetes Sister    Colon cancer Neg Hx     Social History   Socioeconomic History   Marital status: Single    Spouse name: Not on file   Number of children: Not on file   Years of education: Not on file   Highest education level: Not on file  Occupational History   Not on file  Tobacco Use   Smoking status: Every Day    Current packs/day: 0.50    Average packs/day: 0.5 packs/day for 33.0 years (16.5 ttl pk-yrs)    Types: Cigarettes    Passive exposure: Current   Smokeless tobacco: Never  Vaping Use   Vaping status: Never Used  Substance and Sexual Activity   Alcohol use: Not Currently    Comment: last deink 1 week ago   Drug use: No   Sexual activity: Yes    Birth control/protection: None  Other Topics Concern   Not on file  Social History Narrative   Not on file   Social Drivers of Health   Financial Resource Strain: High Risk (11/28/2021)   [facility]   Overall Financial Resource Strain (CARDIA)    Difficulty of Paying Living Expenses: Very hard  Food Insecurity: No Food Insecurity (03/12/2023)   Hunger Vital Sign    Worried About Running Out of Food in the Last Year: Never true    Ran Out of Food in the Last Year: Never true  Transportation Needs: No Transportation Needs (03/12/2023)   PRAPARE - Administrator, Civil Service (Medical): No    Lack of Transportation (Non-Medical): No   Physical Activity: Sufficiently Active (09/13/2020)   Exercise Vital Sign    Days of Exercise per Week: 7 days    Minutes of Exercise per Session: 30 min  Stress: No Stress Concern Present (09/13/2020)   [employer] of Occupational Health - Occupational Stress Questionnaire    Feeling of Stress : Only a little  Social Connections: Moderately Isolated (09/13/2020)   Social Connection and Isolation Panel [NHANES]    Frequency of Communication with Friends and Family: More than three times a week    Frequency of Social Gatherings with Friends and Family: Once a week    Attends Religious Services: More than 4 times per year    Active Member of [employer] or Organizations: No    Attends Banker Meetings: Never    Marital Status: Never married  Intimate Partner Violence: Not At Risk (03/12/2023)   Humiliation, Afraid, Rape, and Kick questionnaire    Fear of Current or Ex-Partner: No    Emotionally Abused: No    Physically Abused: No    Sexually Abused: No     Review of System:   General: Negative for weight loss, fever, chills, fatigue, weakness. See hpi Eyes: Negative for vision changes.  ENT: Negative for hoarseness, difficulty swallowing , nasal congestion. CV: Negative for chest pain, angina, palpitations, dyspnea on exertion, peripheral edema.  Respiratory: Negative for dyspnea at rest, dyspnea on exertion, cough, sputum, wheezing.  GI: See history of present illness. GU:  Negative for dysuria, hematuria, urinary incontinence, urinary frequency, nocturnal urination.  MS: Negative for joint pain, low back pain.  Derm: Negative for rash or itching.  Neuro: Negative for weakness, abnormal sensation, seizure, frequent headaches, memory loss, confusion.  Psych: Negative for anxiety, depression, suicidal ideation, hallucinations.  Endo: Negative for unusual weight change.  Heme: Negative for bruising or bleeding. Allergy: Negative for rash or hives.      Physical  Examination:   Vital signs in last 24 hours: Temp:  [97.7 F (36.5 C)-97.9 F (36.6 C)] 97.7 F (36.5 C) (03/06 0838) Pulse Rate:  [74-87] 74 (03/06 0838) Resp:  [17-19] 19 (03/06 0838) BP: (99-137)/(68-79) 137/79 (03/06 0838) SpO2:  [97 %-99 %] 99 % (03/06 0838) Weight:  [73.9 kg] 73.9 kg (03/06 0551)    General: Well-nourished, well-developed in no acute distress.  Head: Normocephalic, atraumatic.   Eyes: Conjunctiva pink, no icterus. Mouth: Oropharyngeal mucosa moist and pink  Neck: Supple without thyromegaly, masses, or lymphadenopathy.  Lungs: Clear to auscultation bilaterally.  Heart: Regular rate and rhythm, no murmurs rubs or gallops.  Abdomen: Bowel sounds are normal,  nondistended, no hepatosplenomegaly or masses, no abdominal bruits or hernia , no rebound or guarding.  Mild luq tenderness Rectal: not performed Extremities: No lower extremity edema, clubbing, deformity.  Neuro: Alert and oriented x 4 , grossly normal neurologically.  Skin: Warm and dry, no rash or jaundice.   Psych: Alert and cooperative, normal mood and affect.        Intake/Output from previous day: No intake/output data recorded. Intake/Output this shift: Total I/O In: 2000 [IV Piggyback:2000] Out: -   Lab Results:   CBC Recent Labs    07/05/23 0615  WBC 8.0  HGB 14.1  HCT 44.1  MCV 91.5  PLT 225   BMET Recent Labs    07/05/23 0615  NA 129*  K 5.4*  CL 97*  CO2 21*  GLUCOSE 106*  BUN 36*  CREATININE 3.63*  CALCIUM 8.4*   LFT Recent Labs    07/05/23 0615  BILITOT 3.0*  ALKPHOS 103  AST 2,211*  ALT 728*  PROT 8.8*  ALBUMIN 2.8*    Lipase Recent Labs    07/05/23 0615  LIPASE 843*    PT/INR Recent Labs    07/05/23 0750  LABPROT 26.0*  INR 2.4*     Hepatitis Panel No results for input(s): "HEPBSAG", "HCVAB", "HEPAIGM", "HEPBIGM" in the last 72 hours.   Imaging Studies:   US ABDOMEN LIMITED WITH LIVER DOPPLER Result Date: 07/05/2023 CLINICAL DATA:   Abdominal pain.  Known cirrhosis. EXAM: DUPLEX ULTRASOUND OF LIVER TECHNIQUE: Color and duplex Doppler ultrasound was performed to evaluate the hepatic in-flow and out-flow vessels. COMPARISON:  CT 07/05/2023 earlier.  Noncontrast.  MRI 04/12/2023 FINDINGS: Liver: Nodular appearance of the liver. Please correlate with known history. Gallbladder is distended with some wall thickening and edema, nonspecific in the presence of cirrhosis. Common duct measures 5 mm. Main Portal Vein size: 9 mm cm Portal Vein Velocities Main Prox:  35.7 cm/sec Main Mid: 29 cm/sec Main Dist:  31.2 cm/sec Right: 31.9 cm/sec, posterior branch of the right portal vein Left: 19.9 cm/sec Hepatic Vein Velocities Right:  54 cm/sec Middle:  27.5 cm/sec Left:  30 cm/sec IVC: Present and patent with normal respiratory phasicity. Hepatic Artery Velocity:  220.5 cm/sec Portal Vein Occlusion/Thrombus: No Ascites: Trace Varices: None IMPRESSION: Preserved hepatic vasculature. Appropriate direction flow. Relative increased flow along the a [place] artery. Electronically Signed   By: Karen Kays M.D.   On: 07/05/2023 11:54   CT ABDOMEN PELVIS WO CONTRAST Result Date: 07/05/2023 CLINICAL DATA:  Lower abdominal pain.  Nausea  vomiting. EXAM: CT ABDOMEN AND PELVIS WITHOUT CONTRAST TECHNIQUE: Multidetector CT imaging of the abdomen and pelvis was performed following the standard protocol without IV contrast. RADIATION DOSE REDUCTION: This exam was performed according to the departmental dose-optimization program which includes automated exposure control, adjustment of the mA and/or kV according to patient size and/or use of iterative reconstruction technique. COMPARISON:  03/11/2023 FINDINGS: Lower chest: No acute findings. Hepatobiliary: No suspicious focal abnormality in the liver on this study without intravenous contrast. The liver shows diffusely decreased attenuation suggesting fat deposition. Nodular liver contour is compatible with cirrhosis.  Layering tiny calcified gallstones evident. No intrahepatic or extrahepatic biliary dilation. Pancreas: Pancreas is diffusely ill-defined in the region of the head and the tail with subtle peripancreatic edema/inflammation. No main duct dilatation. Spleen: No splenomegaly. No suspicious focal mass lesion. Adrenals/Urinary Tract: No adrenal nodule or mass. Right kidney unremarkable. Left kidney atrophic. No evidence for hydroureter. The urinary bladder appears normal for the degree of distention. Stomach/Bowel: Stomach is nondistended, accentuating wall thickness. Duodenum is normally positioned as is the ligament of Treitz. No small bowel wall thickening. No small bowel dilatation. The terminal ileum is normal. The appendix is normal. No gross colonic mass. No colonic wall thickening. Diverticular changes are noted in the left colon without evidence of diverticulitis. Vascular/Lymphatic: There is moderate atherosclerotic calcification of the abdominal aorta without aneurysm. There is no gastrohepatic or hepatoduodenal ligament lymphadenopathy. No retroperitoneal or mesenteric lymphadenopathy. No pelvic sidewall lymphadenopathy. Reproductive: The prostate gland and seminal vesicles are unremarkable. Other: No intraperitoneal free fluid. Musculoskeletal: No worrisome lytic or sclerotic osseous abnormality. IMPRESSION: 1. Pancreas is diffusely ill-defined through the tail region with subtle peripancreatic edema/inflammation. Imaging features compatible with acute pancreatitis. Pancreatic protocol CT recommended if renal function permits. If not, MRI of the abdomen with and without contrast recommended as pancreatic mass lesion cannot be excluded. 2. Hepatic cirrhosis with hepatic steatosis. 3. Cholelithiasis. 4. Left colonic diverticulosis without diverticulitis. 5. Atrophic left kidney. Electronically Signed   By: Kennith Center M.D.   On: 07/05/2023 07:48  [4 week]  Assessment:   58 y/o male with history of  hypertension, B12 deficiency, prior left lower extremity DVT maintained on ASA, asthma, chronic kidney disease, COPD, kidney stones, cirrhosis due to EtOH, [facility] [place] [facility], FNP), pancreatitis with history of pancreas divisum on MRI also with pancreatitis due to alcohol and gallstones January 2024 (evaluated by [facility] surgery and felt not to be a candidate in light of cirrhosis as risk outweighed benefits), gallbladder polyp, chronically elevated ferritin with hemosiderosis, H63D heterozygous (followed by hematology elevated ferritin thought to be secondary to alcohol consumption and disease rather than H63D heterozygosity) presenting to the ED with complaints of abdominal pain associated with vomiting, diarrhea beginning 3 to 4 days ago. CT showing acute pancreatitis and newly elevated transaminases for which GI was consulted.  Acute pancreatitis with elevated LFTs: -acute onset abdominal pain with N/V/D 3-4 days ago -reports last etoh use 06/09/23 but otherwise had not drank since his last admission 03/2023. -with known gallstones and bump in LFTs, would consider biliary pancreatitis as potential source -he has MRI/MRCP documented pancreas divisum as well -agree with ruling out viral hepatitis  Elevated LFTs: -cannot exclude biliary pancreatitis -other etiologies include viral hepatitis, drug induced (no known new drugs), ischemia (no documented hypotension), less likely etoh  Cirrhosis: -history of decompensation with ascites but improved with reduction in etoh use. -maintained on diuretics, monitored closely by nephrology due to  CKD. -INR newly elevated this admission, will need to monitor closely -monitor for HE -MELD 3.0 of 25 (driven by creatinine), up from 18 in 04/2023.   Plan:   F/u acute hepatitis panel. Trend LFTs, INR. MELD labs daily. Supportive measures including hydration/pain control. Clear liquids if tolerated.   LOS: 0 days    We would like to thank you for the opportunity to participate in the care of [employer].  Leanna Battles. Dixon Boos [facility] [phone] 3/6/20252:40 PM

## 2023-07-05 NOTE — H&P (Signed)
 History and Physical    [name] [name] ZOX:[id number] DOB: [birth year] DOA: 07/05/2023  PCP: Benetta Spar, MD   Patient coming from: Home  Chief Complaint: Abdominal pain  HPI: [name] [name] is a 58 y.o. male with medical history significant for alcoholic liver cirrhosis, hemosiderosis, CKD 3B, dyslipidemia, prior left lower extremity DVT, chronic anemia, COPD, and hypertension who presented to the emergency department with abdominal pain that began about 3-4 days ago.  His pain has been getting worse and is in the lower abdominal region without any significant radiation.  He has been noted to have some nausea and vomiting as well as diarrhea.  He denies any recent alcohol use and states that he quit back on February 8.   ED Course: Vital signs stable and patient afebrile.  Sodium 129 with potassium 5.4, BUN 36 and creatinine 3.6.  INR 2.4.  LFTs significantly elevated with AST 2211 and ALT 728 with bilirubin of 3.  Lipase 843.  CT of the abdomen pelvis notable for acute pancreatitis and cholelithiasis.  Ultrasound abdomen limited with liver Doppler suggested by GI with no significant findings noted.  Hepatitis panel ordered and pending.  Review of Systems: Reviewed as noted above, otherwise negative.  Past Medical History:  Diagnosis Date   Asthma    B12 deficiency 03/15/2022   Cirrhosis (HCC)    Dyspnea    High cholesterol    History of kidney stones    Hypertension    Kidney stones     Past Surgical History:  Procedure Laterality Date   APPENDECTOMY     BIOPSY  07/22/2020   Procedure: BIOPSY;  Surgeon: Corbin Ade, MD;  [facility];  Service: Endoscopy;;   COLONOSCOPY WITH PROPOFOL N/A 04/18/2017   non-bleeding internal hemorrhoids, two 4-6 mm polyps in descending colon and cecum, pancolonic diverticulosis, single cecal AVM. Tubular adenomas, surveillance in 2023.    COLONOSCOPY WITH PROPOFOL N/A 04/13/2022   pancolonic diverticulosis, multiple polyps, one  which was 11 mm (tubular adenomas). Surveillance in 3 years.   ESOPHAGOGASTRODUODENOSCOPY (EGD) WITH PROPOFOL N/A 07/22/2020   normal esophagus, small hiatal hernia, abnormal gastric mucosa, abnormal appearing ampula and periampullary mucosa. Mild chronic gastritis.   ESOPHAGOGASTRODUODENOSCOPY (EGD) WITH PROPOFOL N/A 09/08/2021   normal esophagus, retained food in stomach and duodenum precluded complete examination.   FRACTURE SURGERY     left arm   IR PARACENTESIS  05/31/2021   LOWER EXTREMITY VENOGRAPHY N/A 08/10/2020   Procedure: LOWER EXTREMITY VENOGRAPHY;  Surgeon: Maeola Harman, MD;  Location: [facility] INVASIVE CV LAB;  Service: Cardiovascular;  Laterality: N/A;   PERIPHERAL VASCULAR BALLOON ANGIOPLASTY Left 08/10/2020   Procedure: PERIPHERAL VASCULAR BALLOON ANGIOPLASTY;  Surgeon: Maeola Harman, MD;  Location: [facility] INVASIVE CV LAB;  Service: Cardiovascular;  Laterality: Left;  lower extremity venous   PERIPHERAL VASCULAR THROMBECTOMY N/A 08/10/2020   Procedure: PERIPHERAL VASCULAR THROMBECTOMY;  Surgeon: Maeola Harman, MD;  Location: [facility] INVASIVE CV LAB;  Service: Cardiovascular;  Laterality: N/A;   POLYPECTOMY  04/18/2017   Procedure: POLYPECTOMY;  Surgeon: Corbin Ade, MD;  [facility];  Service: Endoscopy;;   POLYPECTOMY  04/13/2022   Procedure: POLYPECTOMY;  Surgeon: Corbin Ade, MD;  [facility];  Service: Endoscopy;;     reports that he has been smoking cigarettes. He has a 16.5 pack-year smoking history. He has been exposed to tobacco smoke. He has never used smokeless tobacco. He reports that he does not currently use  alcohol. He reports that he does not use drugs.  No Known Allergies  Family History  Problem Relation Age of Onset   Cancer Father        throat   Diabetes Sister    Colon cancer Neg Hx     Prior to Admission medications   Medication Sig Start Date End Date Taking? Authorizing Provider   albuterol (PROVENTIL HFA) 108 (90 Base) MCG/ACT inhaler INHALE 2 PUFFS BY MOUTH EVERY 6 HOURS AS NEEDED FOR COUGHING, WHEEZING, OR SHORTNESS OF BREATH Patient taking differently: Inhale 2 puffs into the lungs every 6 (six) hours as needed for shortness of breath or wheezing. 02/10/21   Shon Hale, MD  aspirin EC 81 MG tablet Take 81 mg by mouth daily.    [provider]  atorvastatin (LIPITOR) 20 MG tablet Take 20 mg by mouth daily. 05/23/23   [provider]  cyanocobalamin (VITAMIN B12) 1000 MCG/ML injection Inject 1,000 mcg into the muscle every 30 (thirty) days.    [provider]  feeding supplement (ENSURE ENLIVE / ENSURE PLUS) LIQD Take 237 mLs by mouth 3 (three) times daily between meals. 03/14/23   Sherryll Burger, Vuk Skillern D, DO  furosemide (LASIX) 20 MG tablet TAKE 2 TABLETS BY MOUTH EVERY MORNING AND 1 TABLET IN THE AFTERNOON Patient taking differently: Take 20 mg by mouth See admin instructions. Take 40 mg in the morning and 20 mg every afternoon 01/12/23   Gelene Mink, NP  metoprolol tartrate (LOPRESSOR) 25 MG tablet Take 25 mg by mouth 2 (two) times daily. 05/24/21   [provider]  mometasone-formoterol (DULERA) 200-5 MCG/ACT AERO Inhale 2 puffs into the lungs 2 (two) times daily. 02/10/21   Shon Hale, MD  ondansetron (ZOFRAN) 4 MG tablet Take 1 tablet (4 mg total) by mouth daily as needed for nausea or vomiting. 03/14/23 03/13/24  Sherryll Burger, Seville Downs D, DO  pantoprazole (PROTONIX) 40 MG tablet TAKE 1 TABLET(40 MG) BY MOUTH DAILY Patient taking differently: Take 40 mg by mouth 2 (two) times daily. 09/18/22   Gelene Mink, NP  sodium bicarbonate 650 MG tablet Take 1 tablet (650 mg total) by mouth 3 (three) times daily. 09/25/22   Vassie Loll, MD  spironolactone (ALDACTONE) 50 MG tablet TAKE 2 TABLETS BY MOUTH EVERY MORNING AND 1 TABLET DAILY IN THE AFTERNOON Patient taking differently: Take 50 mg by mouth See admin instructions. Take 100 mg in the  morning and 50 mg in the afternoon 12/28/22   Gelene Mink, NP  tamsulosin (FLOMAX) 0.4 MG CAPS capsule Take 1 capsule (0.4 mg total) by mouth daily after supper. 05/10/23   Gelene Mink, NP    Physical Exam: Vitals:   07/05/23 4259 07/05/23 0550 07/05/23 0551 07/05/23 0838  BP:  99/68  137/79  Pulse:  87  74  Resp:  17  19  Temp:  97.9 F (36.6 C)  97.7 F (36.5 C)  TempSrc:  Oral  Oral  SpO2: 97% 98%  99%  Weight:   73.9 kg   Height:   6' (1.829 m)     Constitutional: NAD, calm, comfortable Vitals:   07/05/23 0547 07/05/23 0550 07/05/23 0551 07/05/23 0838  BP:  99/68  137/79  Pulse:  87  74  Resp:  17  19  Temp:  97.9 F (36.6 C)  97.7 F (36.5 C)  TempSrc:  Oral  Oral  SpO2: 97% 98%  99%  Weight:   73.9 kg   Height:  6' (1.829 m)    Eyes: lids and conjunctivae normal Neck: normal, supple Respiratory: clear to auscultation bilaterally. Normal respiratory effort. No accessory muscle use.  Cardiovascular: Regular rate and rhythm, no murmurs. Abdomen: Tenderness to palpation throughout, no distention. Bowel sounds positive.  Musculoskeletal:  No edema. Skin: no rashes, lesions, ulcers.  Psychiatric: Flat affect  Labs on Admission: I have personally reviewed following labs and imaging studies  CBC: Recent Labs  Lab 07/05/23 0615  WBC 8.0  HGB 14.1  HCT 44.1  MCV 91.5  PLT 225   Basic Metabolic Panel: Recent Labs  Lab 07/05/23 0615  NA 129*  K 5.4*  CL 97*  CO2 21*  GLUCOSE 106*  BUN 36*  CREATININE 3.63*  CALCIUM 8.4*   GFR: Estimated Creatinine Clearance: 23.2 mL/min (A) (by C-G formula based on SCr of 3.63 mg/dL (H)). Liver Function Tests: Recent Labs  Lab 07/05/23 0615  AST 2,211*  ALT 728*  ALKPHOS 103  BILITOT 3.0*  PROT 8.8*  ALBUMIN 2.8*   Recent Labs  Lab 07/05/23 0615  LIPASE 843*   No results for input(s): "AMMONIA" in the last 168 hours. Coagulation Profile: Recent Labs  Lab 07/05/23 0750  INR 2.4*   Cardiac  Enzymes: No results for input(s): "CKTOTAL", "CKMB", "CKMBINDEX", "TROPONINI" in the last 168 hours. BNP (last 3 results) No results for input(s): "PROBNP" in the last 8760 hours. HbA1C: No results for input(s): "HGBA1C" in the last 72 hours. CBG: No results for input(s): "GLUCAP" in the last 168 hours. Lipid Profile: No results for input(s): "CHOL", "HDL", "LDLCALC", "TRIG", "CHOLHDL", "LDLDIRECT" in the last 72 hours. Thyroid Function Tests: No results for input(s): "TSH", "T4TOTAL", "FREET4", "T3FREE", "THYROIDAB" in the last 72 hours. Anemia Panel: No results for input(s): "VITAMINB12", "FOLATE", "FERRITIN", "TIBC", "IRON", "RETICCTPCT" in the last 72 hours. Urine analysis:    Component Value Date/Time   COLORURINE YELLOW 03/11/2023 2215   APPEARANCEUR CLEAR 03/11/2023 2215   APPEARANCEUR Clear 12/28/2022 1041   LABSPEC 1.013 03/11/2023 2215   PHURINE 5.0 03/11/2023 2215   GLUCOSEU NEGATIVE 03/11/2023 2215   HGBUR NEGATIVE 03/11/2023 2215   BILIRUBINUR NEGATIVE 03/11/2023 2215   BILIRUBINUR Negative 12/28/2022 1041   KETONESUR NEGATIVE 03/11/2023 2215   PROTEINUR 100 (A) 03/11/2023 2215   UROBILINOGEN 0.2 11/02/2013 0816   NITRITE NEGATIVE 03/11/2023 2215   LEUKOCYTESUR NEGATIVE 03/11/2023 2215    Radiological Exams on Admission: US ABDOMEN LIMITED WITH LIVER DOPPLER Result Date: 07/05/2023 CLINICAL DATA:  Abdominal pain.  Known cirrhosis. EXAM: DUPLEX ULTRASOUND OF LIVER TECHNIQUE: Color and duplex Doppler ultrasound was performed to evaluate the hepatic in-flow and out-flow vessels. COMPARISON:  CT 07/05/2023 earlier.  Noncontrast.  MRI 04/12/2023 FINDINGS: Liver: Nodular appearance of the liver. Please correlate with known history. Gallbladder is distended with some wall thickening and edema, nonspecific in the presence of cirrhosis. Common duct measures 5 mm. Main Portal Vein size: 9 mm cm Portal Vein Velocities Main Prox:  35.7 cm/sec Main Mid: 29 cm/sec Main Dist:  31.2  cm/sec Right: 31.9 cm/sec, posterior branch of the right portal vein Left: 19.9 cm/sec Hepatic Vein Velocities Right:  54 cm/sec Middle:  27.5 cm/sec Left:  30 cm/sec IVC: Present and patent with normal respiratory phasicity. Hepatic Artery Velocity:  220.5 cm/sec Portal Vein Occlusion/Thrombus: No Ascites: Trace Varices: None IMPRESSION: Preserved hepatic vasculature. Appropriate direction flow. Relative increased flow along the a [place] artery. Electronically Signed   By: Karen Kays M.D.   On: 07/05/2023 11:54  CT ABDOMEN PELVIS WO CONTRAST Result Date: 07/05/2023 CLINICAL DATA:  Lower abdominal pain.  Nausea vomiting. EXAM: CT ABDOMEN AND PELVIS WITHOUT CONTRAST TECHNIQUE: Multidetector CT imaging of the abdomen and pelvis was performed following the standard protocol without IV contrast. RADIATION DOSE REDUCTION: This exam was performed according to the departmental dose-optimization program which includes automated exposure control, adjustment of the mA and/or kV according to patient size and/or use of iterative reconstruction technique. COMPARISON:  03/11/2023 FINDINGS: Lower chest: No acute findings. Hepatobiliary: No suspicious focal abnormality in the liver on this study without intravenous contrast. The liver shows diffusely decreased attenuation suggesting fat deposition. Nodular liver contour is compatible with cirrhosis. Layering tiny calcified gallstones evident. No intrahepatic or extrahepatic biliary dilation. Pancreas: Pancreas is diffusely ill-defined in the region of the head and the tail with subtle peripancreatic edema/inflammation. No main duct dilatation. Spleen: No splenomegaly. No suspicious focal mass lesion. Adrenals/Urinary Tract: No adrenal nodule or mass. Right kidney unremarkable. Left kidney atrophic. No evidence for hydroureter. The urinary bladder appears normal for the degree of distention. Stomach/Bowel: Stomach is nondistended, accentuating wall thickness. Duodenum is  normally positioned as is the ligament of Treitz. No small bowel wall thickening. No small bowel dilatation. The terminal ileum is normal. The appendix is normal. No gross colonic mass. No colonic wall thickening. Diverticular changes are noted in the left colon without evidence of diverticulitis. Vascular/Lymphatic: There is moderate atherosclerotic calcification of the abdominal aorta without aneurysm. There is no gastrohepatic or hepatoduodenal ligament lymphadenopathy. No retroperitoneal or mesenteric lymphadenopathy. No pelvic sidewall lymphadenopathy. Reproductive: The prostate gland and seminal vesicles are unremarkable. Other: No intraperitoneal free fluid. Musculoskeletal: No worrisome lytic or sclerotic osseous abnormality. IMPRESSION: 1. Pancreas is diffusely ill-defined through the tail region with subtle peripancreatic edema/inflammation. Imaging features compatible with acute pancreatitis. Pancreatic protocol CT recommended if renal function permits. If not, MRI of the abdomen with and without contrast recommended as pancreatic mass lesion cannot be excluded. 2. Hepatic cirrhosis with hepatic steatosis. 3. Cholelithiasis. 4. Left colonic diverticulosis without diverticulitis. 5. Atrophic left kidney. Electronically Signed   By: Kennith Center M.D.   On: 07/05/2023 07:48    Assessment/Plan Principal Problem:   Acute pancreatitis    Acute pancreatitis with transaminitis -Has history of hemosiderosis and follows hematology -No alcohol use noted since 2/8 according to patient -Continue aggressive IV fluid with 2 L bolus given in ED -Keep n.p.o. except for sips with medications and ice chips -Follow labs -Pain management and antiemetics -Appreciate GI recommendations with acute hepatitis panel pending, liver Doppler/ultrasound negative  AKI on CKD stage IIIb with hyperkalemia and hyponatremia -Baseline creatinine near 2.0 and currently at 3.6 -Plan to give [facility] and monitor -Continue  aggressive IV fluid with normal saline -Monitor strict I's and O's -Avoid nephrotoxic agents -Follow labs  COPD -No acute bronchospasms currently noted, continue to monitor  Folate/B12/B6 deficiencies -Continue supplementation and follow-up with hematology  Prior history of left lower extremity DVT -Continue aspirin -Has completed prior course of anticoagulation  Dyslipidemia -Hold statin given transaminitis  Hypertension -Hold home blood pressure agents given soft blood pressure readings  DVT prophylaxis: Heparin Code Status: Full Family Communication: None at bedside Disposition Plan: Admit for treatment of pancreatitis Consults called: GI Admission status: Inpatient, telemetry  Severity of Illness: The appropriate patient status for this patient is INPATIENT. Inpatient status is judged to be reasonable and necessary in order to provide the required intensity of service to ensure the patient's safety. The patient's presenting symptoms,  physical exam findings, and initial radiographic and laboratory data in the context of their chronic comorbidities is felt to place them at high risk for further clinical deterioration. Furthermore, it is not anticipated that the patient will be medically stable for discharge from the hospital within 2 midnights of admission.   * I certify that at the point of admission it is my clinical judgment that the patient will require inpatient hospital care spanning beyond 2 midnights from the point of admission due to high intensity of service, high risk for further deterioration and high frequency of surveillance required.*   Sadey Yandell D Isobella Ascher DO [facility]  If 7PM-7AM, please contact night-coverage [email or web address]  07/05/2023, 12:45 PM

## 2023-07-05 NOTE — ED Provider Notes (Signed)
 [facility] Emergency Department Provider Note MRN:  [id number]  Arrival date & time: 07/05/23     Chief Complaint   Abdominal Pain and Nausea   History of Present Illness   [name] [name] is a 58 y.o. year-old male with a history of pancreatitis, cirrhosis presenting to the ED with chief complaint of abdominal pain.  Lower abdominal pain for the past 2 or 3 days, getting worse.  Associated with nausea vomiting and diarrhea.  Denies fever.  No chest pain or shortness of breath, no burning with urination.  Review of Systems  A thorough review of systems was obtained and all systems are negative except as noted in the HPI and PMH.   Patient's Health History    Past Medical History:  Diagnosis Date   Asthma    B12 deficiency 03/15/2022   Cirrhosis (HCC)    Dyspnea    High cholesterol    History of kidney stones    Hypertension    Kidney stones     Past Surgical History:  Procedure Laterality Date   APPENDECTOMY     BIOPSY  07/22/2020   Procedure: BIOPSY;  Surgeon: Corbin Ade, MD;  [facility];  Service: Endoscopy;;   COLONOSCOPY WITH PROPOFOL N/A 04/18/2017   non-bleeding internal hemorrhoids, two 4-6 mm polyps in descending colon and cecum, pancolonic diverticulosis, single cecal AVM. Tubular adenomas, surveillance in 2023.    COLONOSCOPY WITH PROPOFOL N/A 04/13/2022   pancolonic diverticulosis, multiple polyps, one which was 11 mm (tubular adenomas). Surveillance in 3 years.   ESOPHAGOGASTRODUODENOSCOPY (EGD) WITH PROPOFOL N/A 07/22/2020   normal esophagus, small hiatal hernia, abnormal gastric mucosa, abnormal appearing ampula and periampullary mucosa. Mild chronic gastritis.   ESOPHAGOGASTRODUODENOSCOPY (EGD) WITH PROPOFOL N/A 09/08/2021   normal esophagus, retained food in stomach and duodenum precluded complete examination.   FRACTURE SURGERY     left arm   IR PARACENTESIS  05/31/2021   LOWER EXTREMITY VENOGRAPHY N/A 08/10/2020    Procedure: LOWER EXTREMITY VENOGRAPHY;  Surgeon: Maeola Harman, MD;  Location: [facility] INVASIVE CV LAB;  Service: Cardiovascular;  Laterality: N/A;   PERIPHERAL VASCULAR BALLOON ANGIOPLASTY Left 08/10/2020   Procedure: PERIPHERAL VASCULAR BALLOON ANGIOPLASTY;  Surgeon: Maeola Harman, MD;  Location: [facility] INVASIVE CV LAB;  Service: Cardiovascular;  Laterality: Left;  lower extremity venous   PERIPHERAL VASCULAR THROMBECTOMY N/A 08/10/2020   Procedure: PERIPHERAL VASCULAR THROMBECTOMY;  Surgeon: Maeola Harman, MD;  Location: [facility] INVASIVE CV LAB;  Service: Cardiovascular;  Laterality: N/A;   POLYPECTOMY  04/18/2017   Procedure: POLYPECTOMY;  Surgeon: Corbin Ade, MD;  [facility];  Service: Endoscopy;;   POLYPECTOMY  04/13/2022   Procedure: POLYPECTOMY;  Surgeon: Corbin Ade, MD;  [facility];  Service: Endoscopy;;    Family History  Problem Relation Age of Onset   Cancer Father        throat   Diabetes Sister    Colon cancer Neg Hx     Social History   Socioeconomic History   Marital status: Single    Spouse name: Not on file   Number of children: Not on file   Years of education: Not on file   Highest education level: Not on file  Occupational History   Not on file  Tobacco Use   Smoking status: Every Day    Current packs/day: 0.50    Average packs/day: 0.5 packs/day for 33.0 years (16.5 ttl pk-yrs)    Types:  Cigarettes    Passive exposure: Current   Smokeless tobacco: Never  Vaping Use   Vaping status: Never Used  Substance and Sexual Activity   Alcohol use: Not Currently    Comment: last deink 1 week ago   Drug use: No   Sexual activity: Yes    Birth control/protection: None  Other Topics Concern   Not on file  Social History Narrative   Not on file   Social Drivers of Health   Financial Resource Strain: High Risk (11/28/2021)   [facility]   Overall Financial Resource  Strain (CARDIA)    Difficulty of Paying Living Expenses: Very hard  Food Insecurity: No Food Insecurity (03/12/2023)   Hunger Vital Sign    Worried About Running Out of Food in the Last Year: Never true    Ran Out of Food in the Last Year: Never true  Transportation Needs: No Transportation Needs (03/12/2023)   PRAPARE - Administrator, Civil Service (Medical): No    Lack of Transportation (Non-Medical): No  Physical Activity: Sufficiently Active (09/13/2020)   Exercise Vital Sign    Days of Exercise per Week: 7 days    Minutes of Exercise per Session: 30 min  Stress: No Stress Concern Present (09/13/2020)   [employer] of Occupational Health - Occupational Stress Questionnaire    Feeling of Stress : Only a little  Social Connections: Moderately Isolated (09/13/2020)   Social Connection and Isolation Panel [NHANES]    Frequency of Communication with Friends and Family: More than three times a week    Frequency of Social Gatherings with Friends and Family: Once a week    Attends Religious Services: More than 4 times per year    Active Member of [employer] or Organizations: No    Attends Banker Meetings: Never    Marital Status: Never married  Intimate Partner Violence: Not At Risk (03/12/2023)   Humiliation, Afraid, Rape, and Kick questionnaire    Fear of Current or Ex-Partner: No    Emotionally Abused: No    Physically Abused: No    Sexually Abused: No     Physical Exam   Vitals:   07/05/23 0547 07/05/23 0550  BP:  99/68  Pulse:  87  Resp:  17  Temp:  97.9 F (36.6 C)  SpO2: 97% 98%    CONSTITUTIONAL: Chronically ill-appearing, NAD NEURO/PSYCH:  Alert and oriented x 3, no focal deficits EYES:  eyes equal and reactive ENT/NECK:  no LAD, no JVD CARDIO: Regular rate, well-perfused, normal S1 and S2 PULM:  CTAB no wheezing or rhonchi GI/GU:  non-distended, non-tender MSK/SPINE:  No gross deformities, no edema SKIN:  no rash,  atraumatic   *Additional and/or pertinent findings included in MDM below  Diagnostic and Interventional Summary    EKG Interpretation Date/Time:    Ventricular Rate:    PR Interval:    QRS Duration:    QT Interval:    QTC Calculation:   R Axis:      Text Interpretation:         Labs Reviewed  PROTIME-INR  LIPASE, BLOOD  URINALYSIS, ROUTINE W REFLEX MICROSCOPIC  COMPREHENSIVE METABOLIC PANEL  CBC    CT ABDOMEN PELVIS W CONTRAST    (Results Pending)    Medications  ondansetron (ZOFRAN) injection 4 mg (has no administration in time range)  morphine (PF) 4 MG/ML injection 4 mg (has no administration in time range)     Procedures  /  Critical Care Procedures  ED Course and Medical Decision Making  Initial Impression and Ddx Differential diagnosis includes colitis, appendicitis, diverticulitis, gastritis, pancreatitis.  Past medical/surgical history that increases complexity of ED encounter: Cirrhosis, pancreatitis  Interpretation of Diagnostics Labs and CT pending  Patient Reassessment and Ultimate Disposition/Management     Signed out to oncoming provider at shift change.  Patient management required discussion with the following services or consulting groups:  None  Complexity of Problems Addressed Acute illness or injury that poses threat of life of bodily function  Additional Data Reviewed and Analyzed Further history obtained from: Prior labs/imaging results  Additional Factors Impacting ED Encounter Risk Consideration of hospitalization  Elmer Sow. Pilar Plate, MD [facility] Emergency Medicine [facility] [email or web address]  Final Clinical Impressions(s) / ED Diagnoses     ICD-10-CM   1. Generalized abdominal pain  R10.84       ED Discharge Orders     None        Discharge Instructions Discussed with and Provided to Patient:   Discharge Instructions   None      Sabas Sous, MD 07/05/23 [phone]

## 2023-07-05 NOTE — ED Provider Notes (Signed)
 Patient was abdominal pain.  CT scan shows pancreatitis and cirrhosis.  I spoke with Dr. Tasia Catchings, gastroenterologist and he recommended a right upper quadrant ultrasound and hepatitis A,B and C.  He also said I have hospitalist admit and GI will consult   Bethann Berkshire, MD 07/05/23 [phone]

## 2023-07-06 ENCOUNTER — Inpatient Hospital Stay (HOSPITAL_COMMUNITY)

## 2023-07-06 DIAGNOSIS — K7200 Acute and subacute hepatic failure without coma: Secondary | ICD-10-CM | POA: Diagnosis present

## 2023-07-06 DIAGNOSIS — K859 Acute pancreatitis without necrosis or infection, unspecified: Secondary | ICD-10-CM | POA: Diagnosis not present

## 2023-07-06 DIAGNOSIS — R7401 Elevation of levels of liver transaminase levels: Secondary | ICD-10-CM | POA: Diagnosis not present

## 2023-07-06 DIAGNOSIS — Z992 Dependence on renal dialysis: Secondary | ICD-10-CM | POA: Diagnosis not present

## 2023-07-06 DIAGNOSIS — I10 Essential (primary) hypertension: Secondary | ICD-10-CM

## 2023-07-06 DIAGNOSIS — K7460 Unspecified cirrhosis of liver: Secondary | ICD-10-CM | POA: Diagnosis not present

## 2023-07-06 DIAGNOSIS — E861 Hypovolemia: Secondary | ICD-10-CM

## 2023-07-06 DIAGNOSIS — K851 Biliary acute pancreatitis without necrosis or infection: Secondary | ICD-10-CM | POA: Diagnosis present

## 2023-07-06 LAB — GLUCOSE, CAPILLARY
Glucose-Capillary: 71 mg/dL (ref 70–99)
Glucose-Capillary: 78 mg/dL (ref 70–99)

## 2023-07-06 LAB — CBC
HCT: 36.7 % — ABNORMAL LOW (ref 39.0–52.0)
HCT: 30 % — ABNORMAL LOW (ref 39.0–52.0)
Hemoglobin: 11 g/dL — ABNORMAL LOW (ref 13.0–17.0)
Hemoglobin: 9.6 g/dL — ABNORMAL LOW (ref 13.0–17.0)
MCH: 28.9 pg (ref 26.0–34.0)
MCH: 29.6 pg (ref 26.0–34.0)
MCHC: 30 g/dL (ref 30.0–36.0)
MCHC: 32 g/dL (ref 30.0–36.0)
MCV: 96.6 fL (ref 80.0–100.0)
MCV: 92.6 fL (ref 80.0–100.0)
Platelets: 164 10*3/uL (ref 150–400)
Platelets: 145 10*3/uL — ABNORMAL LOW (ref 150–400)
RBC: 3.8 MIL/uL — ABNORMAL LOW (ref 4.22–5.81)
RBC: 3.24 MIL/uL — ABNORMAL LOW (ref 4.22–5.81)
RDW: 15.9 % — ABNORMAL HIGH (ref 11.5–15.5)
RDW: 15.7 % — ABNORMAL HIGH (ref 11.5–15.5)
WBC: 15.3 10*3/uL — ABNORMAL HIGH (ref 4.0–10.5)
WBC: 14.6 10*3/uL — ABNORMAL HIGH (ref 4.0–10.5)
nRBC: 0 % (ref 0.0–0.2)
nRBC: 0.1 % (ref 0.0–0.2)

## 2023-07-06 LAB — MRSA NEXT GEN BY PCR, NASAL: MRSA by PCR Next Gen: NOT DETECTED

## 2023-07-06 LAB — URINALYSIS, ROUTINE W REFLEX MICROSCOPIC
Bilirubin Urine: NEGATIVE
Glucose, UA: NEGATIVE mg/dL
Hgb urine dipstick: NEGATIVE
Ketones, ur: NEGATIVE mg/dL
Leukocytes,Ua: NEGATIVE
Nitrite: NEGATIVE
Protein, ur: 100 mg/dL — AB
Specific Gravity, Urine: 1.021 (ref 1.005–1.030)
pH: 5 (ref 5.0–8.0)

## 2023-07-06 LAB — CBG MONITORING, ED
Glucose-Capillary: 120 mg/dL — ABNORMAL HIGH (ref 70–99)
Glucose-Capillary: <10 mg/dL — CL (ref 70–99)
Glucose-Capillary: 84 mg/dL (ref 70–99)

## 2023-07-06 LAB — TRIGLYCERIDES: Triglycerides: 65 mg/dL (ref <150)

## 2023-07-06 LAB — LIPASE, BLOOD: Lipase: 2929 U/L — ABNORMAL HIGH (ref 11–51)

## 2023-07-06 LAB — COMPREHENSIVE METABOLIC PANEL
ALT: 1709 U/L — ABNORMAL HIGH (ref 0–44)
AST: 5021 U/L — ABNORMAL HIGH (ref 15–41)
Albumin: 2.4 g/dL — ABNORMAL LOW (ref 3.5–5.0)
Alkaline Phosphatase: 85 U/L (ref 38–126)
Anion gap: 16 — ABNORMAL HIGH (ref 5–15)
BUN: 38 mg/dL — ABNORMAL HIGH (ref 6–20)
CO2: 14 mmol/L — ABNORMAL LOW (ref 22–32)
Calcium: 7.1 mg/dL — ABNORMAL LOW (ref 8.9–10.3)
Chloride: 103 mmol/L (ref 98–111)
Creatinine, Ser: 4.39 mg/dL — ABNORMAL HIGH (ref 0.61–1.24)
GFR, Estimated: 15 mL/min — ABNORMAL LOW (ref >60)
Glucose, Bld: 21 mg/dL — CL (ref 70–99)
Potassium: 6.7 mmol/L (ref 3.5–5.1)
Sodium: 133 mmol/L — ABNORMAL LOW (ref 135–145)
Total Bilirubin: 3.7 mg/dL — ABNORMAL HIGH (ref 0.0–1.2)
Total Protein: 7.1 g/dL (ref 6.5–8.1)

## 2023-07-06 LAB — MAGNESIUM: Magnesium: 1.6 mg/dL — ABNORMAL LOW (ref 1.7–2.4)

## 2023-07-06 LAB — BASIC METABOLIC PANEL WITH GFR
Anion gap: 19 — ABNORMAL HIGH (ref 5–15)
BUN: 40 mg/dL — ABNORMAL HIGH (ref 6–20)
CO2: 14 mmol/L — ABNORMAL LOW (ref 22–32)
Calcium: 7.2 mg/dL — ABNORMAL LOW (ref 8.9–10.3)
Chloride: 99 mmol/L (ref 98–111)
Creatinine, Ser: 4.81 mg/dL — ABNORMAL HIGH (ref 0.61–1.24)
GFR, Estimated: 13 mL/min — ABNORMAL LOW
Glucose, Bld: 214 mg/dL — ABNORMAL HIGH (ref 70–99)
Potassium: 5.8 mmol/L — ABNORMAL HIGH (ref 3.5–5.1)
Sodium: 132 mmol/L — ABNORMAL LOW (ref 135–145)

## 2023-07-06 LAB — POTASSIUM: Potassium: 5.8 mmol/L — ABNORMAL HIGH (ref 3.5–5.1)

## 2023-07-06 LAB — PROTIME-INR
INR: 5.5 (ref 0.8–1.2)
Prothrombin Time: 50.4 s — ABNORMAL HIGH (ref 11.4–15.2)

## 2023-07-06 LAB — HEPATITIS B SURFACE ANTIGEN: Hepatitis B Surface Ag: NONREACTIVE

## 2023-07-06 LAB — ETHANOL: Alcohol, Ethyl (B): <10 mg/dL (ref <10)

## 2023-07-06 MED ORDER — ALBUMIN HUMAN 25 % IV SOLN
50.0000 g | Freq: Once | INTRAVENOUS | Status: AC
  Administered 2023-07-06: 50 g via INTRAVENOUS
  Filled 2023-07-06: qty 200

## 2023-07-06 MED ORDER — MAGNESIUM SULFATE IN D5W 1-5 GM/100ML-% IV SOLN
1.0000 g | Freq: Once | INTRAVENOUS | Status: AC
  Administered 2023-07-06: 1 g via INTRAVENOUS
  Filled 2023-07-06: qty 100

## 2023-07-06 MED ORDER — DEXTROSE 50 % IV SOLN
INTRAVENOUS | Status: AC
  Administered 2023-07-06: 50 mL via INTRAVENOUS
  Filled 2023-07-06: qty 50

## 2023-07-06 MED ORDER — ACETYLCYSTEINE LOAD VIA INFUSION
150.0000 mg/kg | Freq: Once | INTRAVENOUS | Status: AC
  Administered 2023-07-06: 11085 mg via INTRAVENOUS
  Filled 2023-07-06: qty 364

## 2023-07-06 MED ORDER — CALCIUM GLUCONATE-NACL 1-0.675 GM/50ML-% IV SOLN
1.0000 g | Freq: Once | INTRAVENOUS | Status: AC
  Administered 2023-07-06: 1000 mg via INTRAVENOUS
  Filled 2023-07-06: qty 50

## 2023-07-06 MED ORDER — DOCUSATE SODIUM 100 MG PO CAPS: 100.0000 mg | ORAL_CAPSULE | Freq: Two times a day (BID) | ORAL | Status: DC | PRN

## 2023-07-06 MED ORDER — DEXTROSE 50 % IV SOLN: 1.0000 | Freq: Once | INTRAVENOUS | Status: AC

## 2023-07-06 MED ORDER — HEPARIN SODIUM (PORCINE) 1000 UNIT/ML IJ SOLN
4000.0000 [IU] | Freq: Once | INTRAMUSCULAR | Status: AC
  Administered 2023-07-06: 4000 [IU]

## 2023-07-06 MED ORDER — ALTEPLASE 2 MG IJ SOLR: 2.0000 mg | Freq: Once | INTRAMUSCULAR | Status: DC | PRN

## 2023-07-06 MED ORDER — INSULIN ASPART 100 UNIT/ML IV SOLN: 10.0000 [IU] | Freq: Once | INTRAVENOUS | Status: DC

## 2023-07-06 MED ORDER — DEXTROSE 5 % IV SOLN
6.2500 mg/kg/h | INTRAVENOUS | Status: AC
  Administered 2023-07-06 – 2023-07-08 (×3): 6.25 mg/kg/h via INTRAVENOUS
  Filled 2023-07-06 (×3): qty 90

## 2023-07-06 MED ORDER — POLYETHYLENE GLYCOL 3350 17 G PO PACK: 17.0000 g | PACK | Freq: Every day | ORAL | Status: DC | PRN

## 2023-07-06 MED ORDER — CHLORHEXIDINE GLUCONATE CLOTH 2 % EX PADS
6.0000 | MEDICATED_PAD | Freq: Every day | CUTANEOUS | Status: DC
  Administered 2023-07-08 – 2023-07-15 (×8): 6 via TOPICAL

## 2023-07-06 MED ORDER — SODIUM ZIRCONIUM CYCLOSILICATE 5 G PO PACK
10.0000 g | PACK | Freq: Once | ORAL | Status: AC
  Administered 2023-07-06: 10 g via ORAL
  Filled 2023-07-06: qty 2

## 2023-07-06 MED ORDER — IPRATROPIUM-ALBUTEROL 0.5-2.5 (3) MG/3ML IN SOLN
3.0000 mL | Freq: Four times a day (QID) | RESPIRATORY_TRACT | Status: DC
  Administered 2023-07-06 (×3): 3 mL via RESPIRATORY_TRACT
  Filled 2023-07-06 (×3): qty 3

## 2023-07-06 MED ORDER — DEXTROSE 50 % IV SOLN
1.0000 | Freq: Once | INTRAVENOUS | Status: AC
Start: 2023-07-06 — End: 2023-07-06
  Administered 2023-07-06: 50 mL via INTRAVENOUS
  Filled 2023-07-06: qty 50

## 2023-07-06 MED ORDER — DEXTROSE-SODIUM CHLORIDE 5-0.9 % IV SOLN: INTRAVENOUS | Status: AC

## 2023-07-06 MED ORDER — DEXTROSE 5 % IV SOLN
12.5000 mg/kg/h | INTRAVENOUS | Status: AC
  Administered 2023-07-06: 12.5 mg/kg/h via INTRAVENOUS
  Filled 2023-07-06: qty 90

## 2023-07-06 MED ORDER — OXYCODONE HCL 5 MG PO TABS
5.0000 mg | ORAL_TABLET | ORAL | Status: DC | PRN
  Administered 2023-07-06 – 2023-07-07 (×2): 5 mg via ORAL
  Filled 2023-07-06 (×2): qty 1

## 2023-07-06 MED ORDER — IPRATROPIUM-ALBUTEROL 0.5-2.5 (3) MG/3ML IN SOLN: 3.0000 mL | Freq: Four times a day (QID) | RESPIRATORY_TRACT | Status: DC | PRN

## 2023-07-06 NOTE — Consult Note (Addendum)
 NAME:  [name] [name], MRN:  [id number], DOB:  [birth year], LOS: 1 ADMISSION DATE:  07/05/2023, CONSULTATION DATE:  07/06/2023 REFERRING MD:  Randye Lobo, CHIEF COMPLAINT:  Abdominal Pain   History of Present Illness:  [name] [name] is a 58 year old gentleman with a known medical history significant for hypertension, COPD, alcoholic liver cirrhosis, hemosiderosis, CKD 3B, dyslipidemia, prior left lower extremity DVT presenting to [facility] health ED secondary to 3 to 4-day complaint of abdominal pain.  Patient describes bilateral flank type pain increasing in intensity without radiation to groin chest or back.  He states this is similar pain to his previous admission for pancreatitis.  Patient does elicit nausea and vomiting and diarrhea.   It is noted that the patient does have history of cholelithiasis and decompensated cirrhosis.  Patient reports he has not had any alcohol since his birthday on [birth year], however according to GI note he made comments that his last drink was on this past Sunday.  CT of the abdomen pelvis without contrast performed on July 05, 2023 reveals: IMPRESSION: 1. Pancreas is diffusely ill-defined through the tail region with subtle peripancreatic edema/inflammation. Imaging features compatible with acute pancreatitis. Pancreatic protocol CT recommended if renal function permits. If not, MRI of the abdomen with and without contrast recommended as pancreatic mass lesion cannot be excluded. 2. Hepatic cirrhosis with hepatic steatosis. 3. Cholelithiasis. 4. Left colonic diverticulosis without diverticulitis. 5. Atrophic left kidney.  Abdomen Limited ultrasound performed today 07/06/2023 reveals no significant abdominal ascites.  Laboratory indices: He does have elevation in coagulation studies with a PT of 26 on yesterday that elevated to 50.4 today 3 10/31/2023 and INR at admission yesterday 2.4 elevated to 5.5 today.  CBC: WBC 15.3, hemoglobin 11, hematocrit 36.7, platelets 164.   BMP sodium 132, potassium 5.8, chloride 99, CO2 14 BUN 40, creatinine 4.81 and glucose 214.  LFTs: Lipase 2929, AST 5021, ALT 05/08/2007.  Secondary to patient's known CKD presenting now with AKI and presence of oliguria a temporary HD line was placed by surgery team today for hemodialysis.  Patient underwent 2-hour HD in the ED prior to transfer.  Patient did receive IV fluids for pancreatitis management, as he is a poor candidate for liver transplant due to ongoing alcohol use per GI note.  Consults: Patient has been followed by nephrology and gastrointestinal services Blood cultures sent 07/06/2023 secondary to leukocytosis.  As such patient was transferred to [facility] for ICU monitoring and management and admitted with acute on chronic liver failure/pancreatitis.  Pertinent  Medical History  Asthma, B12 deficiency, cirrhosis, dyspnea, hyperlipidemia, history of kidney stones, hypertension, kidney stones, COPD, prior lower left extremity DVT, chronic anemia.  Significant Hospital Events: Including procedures, antibiotic start and stop dates in addition to other pertinent events   Hemodialysis 07/06/2023 (temporary HD line placed)  Interim History / Subjective:  58 year old gentleman transferred to [facility] secondary to abdominal pain, acute on chronic liver failure.  Objective   Blood pressure (!) 94/57, pulse (!) 106, temperature 98.1 F (36.7 C), temperature source Oral, resp. rate (!) 25, height 6' (1.829 m), weight 73.9 kg, SpO2 95%.        Intake/Output Summary (Last 24 hours) at 07/06/2023 2034 Last data filed at 07/06/2023 1800 Gross per 24 hour  Intake 792.46 ml  Output 0 ml  Net 792.46 ml   Filed Weights   07/05/23 0551  Weight: 73.9 kg    Examination: Physical exam General: NAD, A&O x 3 Lungs: CTAB no rhonchi, wheeze,  rales. Normal respiratory effort. No accessory muscle use Cardiac: S1 S2, no murmer, rub or gallop.  No upper or lower extremity edema Abdomen:  Positive tenderness throughout, decreased bowel Sounds, mild guarding, non distended Skin: warm, good turgor Neuro: CN II-XII intact GU: Oliguria  Resolved Hospital Problem list   N/A  Assessment & Plan:  Acute pancreatitis with transaminitis -Has history of hemosiderosis and follows hematology -No alcohol use noted since 2/8 according to patient -Continue aggressive IV fluid D5 NACL@125  ml/hr (2 L given in ED on 07/05/2023) -Keep n.p.o. except for sips with medications and ice chips -Follow labs -Pain management and antiemetics -Following GI recommendations with acute hepatitis panel pending, liver Doppler/ultrasound negative -Pending MRCP   AKI on CKD stage IIIb with hyperkalemia and hyponatremia -Baseline creatinine near 2.0 and currently at 3.6 -Lokelma given, potassium 6.7 on prior admission, 5.8 this a.m. Stat BMP ordered. -Continue aggressive IV fluid d5 NACL -Monitor strict I's and O's -Avoid nephrotoxic agents -Follow labs   COPD -No acute bronchospasms currently noted, continue to monitor -DuoNeb every 6   Folate/B12/B6 deficiencies -Continue supplementation and follow-up with hematology   Prior history of left lower extremity DVT -Continue aspirin -Heparin 5K Agency injection every 8   Dyslipidemia -Hold statin given transaminitis   Hypertension -Hold home blood pressure agents given soft blood pressure readings  Best Practice (right click and "Reselect all SmartList Selections" daily)   Diet/type: NPO DVT prophylaxis prophylactic heparin  Pressure ulcer(s): N/A GI prophylaxis: N/A Lines: N/A Foley:  N/A Code Status:  full code Last date of multidisciplinary goals of care discussion []   Labs   CBC: Recent Labs  Lab 07/05/23 0615 07/06/23 0440  WBC 8.0 15.3*  HGB 14.1 11.0*  HCT 44.1 36.7*  MCV 91.5 96.6  PLT 225 164    Basic Metabolic Panel: Recent Labs  Lab 07/05/23 0615 07/06/23 0440 07/06/23 0921 07/06/23 1001  NA 129* 133*  --  132*   K 5.4* 6.7* 5.8* 5.8*  CL 97* 103  --  99  CO2 21* 14*  --  14*  GLUCOSE 106* 21*  --  214*  BUN 36* 38*  --  40*  CREATININE 3.63* 4.39*  --  4.81*  CALCIUM 8.4* 7.1*  --  7.2*  MG  --  1.6*  --   --    GFR: Estimated Creatinine Clearance: 17.5 mL/min (A) (by C-G formula based on SCr of 4.81 mg/dL (H)). Recent Labs  Lab 07/05/23 0615 07/06/23 0440  WBC 8.0 15.3*    Liver Function Tests: Recent Labs  Lab 07/05/23 0615 07/06/23 0440  AST 2,211* 5,021*  ALT 728* 1,709*  ALKPHOS 103 85  BILITOT 3.0* 3.7*  PROT 8.8* 7.1  ALBUMIN 2.8* 2.4*   Recent Labs  Lab 07/05/23 0615 07/06/23 0440  LIPASE 843* 2,929*   No results for input(s): "AMMONIA" in the last 168 hours.  ABG    Component Value Date/Time   TCO2 26 07/22/2020 1324     Coagulation Profile: Recent Labs  Lab 07/05/23 0750 07/06/23 0440  INR 2.4* 5.5*    Cardiac Enzymes: No results for input(s): "CKTOTAL", "CKMB", "CKMBINDEX", "TROPONINI" in the last 168 hours.  HbA1C: Hgb A1c MFr Bld  Date/Time Value Ref Range Status  05/27/2019 12:57 PM 5.7 (H) 4.8 - 5.6 % Final    Comment:    (NOTE) Pre diabetes:          5.7%-6.4% Diabetes:              >  6.4% Glycemic control for   <7.0% adults with diabetes     CBG: Recent Labs  Lab 07/06/23 0646 07/06/23 0716 07/06/23 0921 07/06/23 2005  GLUCAP <10* 120* 84 78    Review of Systems:   Review of Systems  Constitutional: Negative.   HENT: Negative.    Eyes: Negative.   Respiratory: Negative.    Cardiovascular: Negative.   Gastrointestinal:  Positive for abdominal pain.  Genitourinary: Negative.   Musculoskeletal: Negative.   Skin: Negative.   Neurological: Negative.   Endo/Heme/Allergies: Negative.   Psychiatric/Behavioral: Negative.       Past Medical History:  He,  has a past medical history of Asthma, B12 deficiency (03/15/2022), Cirrhosis (HCC), Dyspnea, High cholesterol, History of kidney stones, Hypertension, and Kidney stones.    Surgical History:   Past Surgical History:  Procedure Laterality Date   APPENDECTOMY     BIOPSY  07/22/2020   Procedure: BIOPSY;  Surgeon: Corbin Ade, MD;  Location: [facility];  Service: Endoscopy;;   COLONOSCOPY WITH PROPOFOL N/A 04/18/2017   non-bleeding internal hemorrhoids, two 4-6 mm polyps in descending colon and cecum, pancolonic diverticulosis, single cecal AVM. Tubular adenomas, surveillance in 2023.    COLONOSCOPY WITH PROPOFOL N/A 04/13/2022   pancolonic diverticulosis, multiple polyps, one which was 11 mm (tubular adenomas). Surveillance in 3 years.   ESOPHAGOGASTRODUODENOSCOPY (EGD) WITH PROPOFOL N/A 07/22/2020   normal esophagus, small hiatal hernia, abnormal gastric mucosa, abnormal appearing ampula and periampullary mucosa. Mild chronic gastritis.   ESOPHAGOGASTRODUODENOSCOPY (EGD) WITH PROPOFOL N/A 09/08/2021   normal esophagus, retained food in stomach and duodenum precluded complete examination.   FRACTURE SURGERY     left arm   IR PARACENTESIS  05/31/2021   LOWER EXTREMITY VENOGRAPHY N/A 08/10/2020   Procedure: LOWER EXTREMITY VENOGRAPHY;  Surgeon: Maeola Harman, MD;  Location: [facility] INVASIVE CV LAB;  Service: Cardiovascular;  Laterality: N/A;   PERIPHERAL VASCULAR BALLOON ANGIOPLASTY Left 08/10/2020   Procedure: PERIPHERAL VASCULAR BALLOON ANGIOPLASTY;  Surgeon: Maeola Harman, MD;  Location: [facility] INVASIVE CV LAB;  Service: Cardiovascular;  Laterality: Left;  lower extremity venous   PERIPHERAL VASCULAR THROMBECTOMY N/A 08/10/2020   Procedure: PERIPHERAL VASCULAR THROMBECTOMY;  Surgeon: Maeola Harman, MD;  Location: [facility] INVASIVE CV LAB;  Service: Cardiovascular;  Laterality: N/A;   POLYPECTOMY  04/18/2017   Procedure: POLYPECTOMY;  Surgeon: Corbin Ade, MD;  Location: [facility];  Service: Endoscopy;;   POLYPECTOMY  04/13/2022   Procedure: POLYPECTOMY;  Surgeon: Corbin Ade, MD;  Location: [facility];  Service:  Endoscopy;;     Social History:   reports that he has been smoking cigarettes. He has a 16.5 pack-year smoking history. He has been exposed to tobacco smoke. He has never used smokeless tobacco. He reports that he does not currently use alcohol. He reports that he does not use drugs.   Family History:  His family history includes Cancer in his father; Diabetes in his sister. There is no history of Colon cancer.   Allergies No Known Allergies   Home Medications  Prior to Admission medications   Medication Sig Start Date End Date Taking? Authorizing Provider  albuterol (PROVENTIL HFA) 108 (90 Base) MCG/ACT inhaler INHALE 2 PUFFS BY MOUTH EVERY 6 HOURS AS NEEDED FOR COUGHING, WHEEZING, OR SHORTNESS OF BREATH Patient taking differently: Inhale 2 puffs into the lungs every 6 (six) hours as needed for shortness of breath or wheezing. 02/10/21  Yes Shon Hale, MD  aspirin EC 81 MG tablet  Take 81 mg by mouth daily.   Yes [provider]  atorvastatin (LIPITOR) 20 MG tablet Take 20 mg by mouth daily. 05/23/23  Yes [provider]  cyanocobalamin (VITAMIN B12) 1000 MCG/ML injection Inject 1,000 mcg into the muscle every 30 (thirty) days.   Yes [provider]  feeding supplement (ENSURE ENLIVE / ENSURE PLUS) LIQD Take 237 mLs by mouth 3 (three) times daily between meals. 03/14/23  Yes Lile Mccurley, Pratik D, DO  furosemide (LASIX) 20 MG tablet TAKE 2 TABLETS BY MOUTH EVERY MORNING AND 1 TABLET IN THE AFTERNOON Patient taking differently: Take 20 mg by mouth See admin instructions. Take 40 mg in the morning and 20 mg every afternoon 01/12/23  Yes Gelene Mink, NP  metoprolol tartrate (LOPRESSOR) 25 MG tablet Take 25 mg by mouth 2 (two) times daily. 05/24/21  Yes [provider]  mometasone-formoterol (DULERA) 200-5 MCG/ACT AERO Inhale 2 puffs into the lungs 2 (two) times daily. 02/10/21  Yes Emokpae, Courage, MD  ondansetron (ZOFRAN) 4 MG tablet Take 1 tablet (4 mg  total) by mouth daily as needed for nausea or vomiting. 03/14/23 03/13/24 Yes Christianjames Soule, Pratik D, DO  pantoprazole (PROTONIX) 40 MG tablet TAKE 1 TABLET(40 MG) BY MOUTH DAILY Patient taking differently: Take 40 mg by mouth daily. 09/18/22  Yes Gelene Mink, NP  sodium bicarbonate 650 MG tablet Take 1 tablet (650 mg total) by mouth 3 (three) times daily. 09/25/22  Yes Vassie Loll, MD  spironolactone (ALDACTONE) 50 MG tablet TAKE 2 TABLETS BY MOUTH EVERY MORNING AND 1 TABLET DAILY IN THE AFTERNOON Patient taking differently: Take 50 mg by mouth See admin instructions. Take 100 mg in the morning and 50 mg in the afternoon 12/28/22  Yes Gelene Mink, NP  tamsulosin (FLOMAX) 0.4 MG CAPS capsule Take 1 capsule (0.4 mg total) by mouth daily after supper. 05/10/23  Yes Gelene Mink, NP     Critical care time: 35 minutes spent with patient    Dalene Carrow. Kizzie Ide, PA-C   Attending note: I have seen and examined the patient. History, labs and imaging reviewed.  Blood pressure (!) 94/57, pulse (!) 106, temperature 98.1 F (36.7 C), temperature source Oral, resp. rate (!) 25, height 6' (1.829 m), weight 162 lb 14.7 oz (73.9 kg), SpO2 95%. Gen:      No acute distress HEENT:  EOMI, no sclera anicteric Neck:     No masses; no thyromegaly Lungs:    Clear to auscultation bilaterally; normal respiratory effort CV:         Regular rate and rhythm; no murmurs Abd:      + bowel sounds; soft, mild tenderness; no palpable masses, mild distension Ext:    No edema; adequate peripheral perfusion Skin:      Warm and dry; no rash Neuro: alert and oriented x 3 Psych: normal mood and affect   Labs/Imaging personally reviewed, significant for   Assessment/plan: 58 y/o male with PMH for Alcohol abuse (drinks beer and liquor but none since Feb), medical history significant for hypertension, COPD, alcoholic liver cirrhosis, hemosiderosis, CKD 3B, dyslipidemia, prior left lower extremity DVT presenting to [facility] health ED  secondary to 3 to 4-day complaint of abdominal pain found to have acute pancreatitis with sepsis. Currently not on pressors and on room air. IVF for low BP. Lokelma for hyperkalemia Monitoring labs including LFTs D/c Tylenol Dilaudid for pain relief Plan for MRCP in a.m.  GI following. The patient is critically ill with  multiple organ systems failure and requires high complexity decision making for assessment and support, frequent evaluation and titration of therapies, application of advanced monitoring technologies and extensive interpretation of multiple databases.  Critical care time - 35 mins. This represents my time independent of the NPs time taking care of the pt.  Andros Channing ShahMD [facility] 07/06/2023, 10:19 PM

## 2023-07-06 NOTE — ED Notes (Signed)
 Trialysis placed by Dr. Robyne Peers  in left femoral area. 2 lines on the side have heparin an dare strictly for dialysis, the purple line in the middle is for nurse to use for medications. 20 cm  1.24ml x 2 HD cath

## 2023-07-06 NOTE — ED Notes (Signed)
 Per Lab glucose 21. Amp dextrose given. EDP notified.

## 2023-07-06 NOTE — Progress Notes (Signed)
   EMERGENT HEMODIALYSIS TREATMENT NOTE:  First ever hemodialysis treatment completed in ED using newly inserted left femoral catheter.  Kept even / no ultrafiltration, as per order.  Soft BPs improved after administration of Albumin 50g, as ordered. Catheter tolerated prescribed flow of 250 ml/m with stable pressures.  2 hour session completed.  All blood was returned.  Transfer to [facility] via [facility] is imminent.  Post-HD:  07/06/23 1800  Vitals  Temp 97.8 F (36.6 C)  Temp Source Oral  BP 126/71  MAP (mmHg) 84  BP Location Left Arm  BP Method Automatic  Patient Position (if appropriate) Lying  Pulse Rate (!) 106  Pulse Rate Source Monitor  ECG Heart Rate (!) 107  Resp (!) 26  Oxygen Therapy  SpO2 100 %  O2 Device Room Air  Post Treatment  Dialyzer Clearance Other (Comment) (No clots, but jaundiced)  Hemodialysis Intake (mL) 200 mL  Liters Processed 30.1  Fluid Removed (mL) 0 mL  Tolerated HD Treatment Yes  Post-Hemodialysis Comments First ever HD session completed  Hemodialysis Catheter Left Femoral vein Triple lumen Temporary (Non-Tunneled)  Placement Date: 07/06/23   Placed prior to admission: No  Orientation: Left  Access Location: Femoral vein  Hemodialysis Catheter Type: Triple lumen Temporary (Non-Tunneled)  Site Condition No complications  Blue Lumen Status Flushed;Heparin locked;Dead end cap in place  Red Lumen Status Flushed;Heparin locked;Dead end cap in place  Purple Lumen Status Saline locked  Catheter fill solution Heparin 1000 units/ml  Catheter fill volume (Arterial) 1.4 cc  Catheter fill volume (Venous) 1.4  Dressing Type Transparent;Tube stabilization device  Dressing Status Antimicrobial disc/dressing in place;Clean, Dry, Intact  Interventions New dressing  Drainage Description None  Dressing Change Due 07/13/23  Post treatment catheter status Capped and Clamped    Arman Filter, RN [facility] ED-6

## 2023-07-06 NOTE — ED Notes (Signed)
 Pt is a hard stick. Several nurses have attempted [place] IV with no success. Attending aware nurse is unable to give meds on time due to medication not compatible.

## 2023-07-06 NOTE — Consult Note (Deleted)
 Consultation  Referring Provider:  PCCM Primary Care Physician:  Benetta Spar, MD Primary Gastroenterologist:  Dr. Jena Gauss Reason for Consultation:     Decompensated cirrhosis, acute pancreatitis  LOS: 1 day          HPI:   [name] [name] is a 58 y.o. male with past medical history significant for  hypertension, B12 deficiency, DVT, asthma, CKD, COPD, cirrhosis due to EtOH(Dawn Drazyk, FNP), pancreatitis due to alcohol and gallstones January 2024 (evaluated by [facility] surgery and felt not to be a candidate in light of cirrhosis as risk outweighed benefits), gallbladder polyp, chronically elevated ferritin with hemosiderosis, H63D heterozygous (followed by hematology elevated ferritin thought to be secondary to alcohol consumption and disease rather than H63D heterozygosity) presenting from [facility] for decompensated cirrhosis.  Patient initially presented to [facility] 3/6 with nausea, vomiting, diarrhea ongoing for last 3 to 4 days.  Last alcoholic beverage was June 09, 2023.  Recently admitted 03/2023 for pancreatitis.  Patient was found to have shock liver, pancreatitis, leukocytosis, and was transferred to [facility] for ICU admission  Initial workup thus far includes: - CT abdomen pelvis without contrast showing pancreas to be diffusely ill-defined through the tail region with subtle peripancreatic edema/inflammation compatible with acute pancreatitis, suggesting follow-up imaging to rule out underlying pancreatic mass. Also with cholelithiasis, atrophic left kidney, hepatic cirrhosis.  - Ultrasound abdomen with liver Doppler negative for thrombus - INR 5.5 - MELD 3.0: 34 3/7 - CMV, EBV, Hep B DNA and HEP C RNA pending - AST 5021, ALT 1709, total bilirubin 3.7 - Viral hepatitis negative - Cultures pending -Repeat CT abdomen pelvis without contrast shows posterior mediastinal mass measuring 6 cm with progressive acute pancreatitis without fluid collection.   Widespread secondary inflammation of bowel with small and large bowel wall thickening.    PREVIOUS GI WORKUP   EGD May 2023 with normal esophagus but retained food in stomach and duodenum precluded exam. EGD variceal screening due May 2025   Colonoscopy Dec 2023: pancolonic diverticulosis, multiple polyps, one which was 11 mm (tubular adenomas). Surveillance in 3 years  Past Medical History:  Diagnosis Date   Asthma    B12 deficiency 03/15/2022   Cirrhosis (HCC)    Dyspnea    High cholesterol    History of kidney stones    Hypertension    Kidney stones     Surgical History:  He  has a past surgical history that includes Fracture surgery; Appendectomy; Colonoscopy with propofol (N/A, 04/18/2017); polypectomy (04/18/2017); Esophagogastroduodenoscopy (egd) with propofol (N/A, 07/22/2020); biopsy (07/22/2020); LOWER EXTREMITY VENOGRAPHY (N/A, 08/10/2020); PERIPHERAL VASCULAR THROMBECTOMY (N/A, 08/10/2020); PERIPHERAL VASCULAR BALLOON ANGIOPLASTY (Left, 08/10/2020); IR Paracentesis (05/31/2021); Esophagogastroduodenoscopy (egd) with propofol (N/A, 09/08/2021); Colonoscopy with propofol (N/A, 04/13/2022); and polypectomy (04/13/2022). Family History:  His family history includes Cancer in his father; Diabetes in his sister. Social History:   reports that he has been smoking cigarettes. He has a 16.5 pack-year smoking history. He has been exposed to tobacco smoke. He has never used smokeless tobacco. He reports that he does not currently use alcohol. He reports that he does not use drugs.  Prior to Admission medications   Medication Sig Start Date End Date Taking? Authorizing Provider  albuterol (PROVENTIL HFA) 108 (90 Base) MCG/ACT inhaler INHALE 2 PUFFS BY MOUTH EVERY 6 HOURS AS NEEDED FOR COUGHING, WHEEZING, OR SHORTNESS OF BREATH Patient taking differently: Inhale 2 puffs into the lungs every 6 (six) hours as needed for shortness  of breath or wheezing. 02/10/21  Yes Shon Hale, MD   aspirin EC 81 MG tablet Take 81 mg by mouth daily.   Yes [provider]  atorvastatin (LIPITOR) 20 MG tablet Take 20 mg by mouth daily. 05/23/23  Yes [provider]  cyanocobalamin (VITAMIN B12) 1000 MCG/ML injection Inject 1,000 mcg into the muscle every 30 (thirty) days.   Yes [provider]  feeding supplement (ENSURE ENLIVE / ENSURE PLUS) LIQD Take 237 mLs by mouth 3 (three) times daily between meals. 03/14/23  Yes Shah, Pratik D, DO  furosemide (LASIX) 20 MG tablet TAKE 2 TABLETS BY MOUTH EVERY MORNING AND 1 TABLET IN THE AFTERNOON Patient taking differently: Take 20 mg by mouth See admin instructions. Take 40 mg in the morning and 20 mg every afternoon 01/12/23  Yes Gelene Mink, NP  metoprolol tartrate (LOPRESSOR) 25 MG tablet Take 25 mg by mouth 2 (two) times daily. 05/24/21  Yes [provider]  mometasone-formoterol (DULERA) 200-5 MCG/ACT AERO Inhale 2 puffs into the lungs 2 (two) times daily. 02/10/21  Yes Emokpae, Courage, MD  ondansetron (ZOFRAN) 4 MG tablet Take 1 tablet (4 mg total) by mouth daily as needed for nausea or vomiting. 03/14/23 03/13/24 Yes Shah, Pratik D, DO  pantoprazole (PROTONIX) 40 MG tablet TAKE 1 TABLET(40 MG) BY MOUTH DAILY Patient taking differently: Take 40 mg by mouth daily. 09/18/22  Yes Gelene Mink, NP  sodium bicarbonate 650 MG tablet Take 1 tablet (650 mg total) by mouth 3 (three) times daily. 09/25/22  Yes Vassie Loll, MD  spironolactone (ALDACTONE) 50 MG tablet TAKE 2 TABLETS BY MOUTH EVERY MORNING AND 1 TABLET DAILY IN THE AFTERNOON Patient taking differently: Take 50 mg by mouth See admin instructions. Take 100 mg in the morning and 50 mg in the afternoon 12/28/22  Yes Gelene Mink, NP  tamsulosin (FLOMAX) 0.4 MG CAPS capsule Take 1 capsule (0.4 mg total) by mouth daily after supper. 05/10/23  Yes Gelene Mink, NP    Current Facility-Administered Medications  Medication Dose Route Frequency Provider Last Rate  Last Admin   acetaminophen (TYLENOL) tablet 650 mg  650 mg Oral Q6H PRN Sherryll Burger, Pratik D, DO       Or   acetaminophen (TYLENOL) suppository 650 mg  650 mg Rectal Q6H PRN Sherryll Burger, Pratik D, DO       acetylcysteine (ACETADOTE) 18,000 mg in dextrose 5 % 590 mL (30.5085 mg/mL) infusion  12.5 mg/kg/hr Intravenous Continuous Brooke Bonito L, NP 30.3 mL/hr at 07/06/23 1152 12.5 mg/kg/hr at 07/06/23 1152   Followed by   acetylcysteine (ACETADOTE) 18,000 mg in dextrose 5 % 590 mL (30.5085 mg/mL) infusion  6.25 mg/kg/hr Intravenous Continuous Mahon, Courtney L, NP       albumin human 25 % solution 50 g  50 g Intravenous Once Brooke Bonito L, NP       Chlorhexidine Gluconate Cloth 2 % PADS 6 each  6 each Topical Q0600 Tyler Pita, MD       dextrose 5 %-0.9 % sodium chloride infusion   Intravenous Continuous Maurilio Lovely D, DO 125 mL/hr at 07/06/23 0804 New Bag at 07/06/23 0804   heparin injection 5,000 Units  5,000 Units Subcutaneous Q8H Shah, Pratik D, DO   5,000 Units at 07/06/23 1354   heparin sodium (porcine) injection 4,000 Units  4,000 Units Intracatheter Once Pappayliou, Catherine A, DO       HYDROmorphone (DILAUDID) injection 0.5-1 mg  0.5-1 mg Intravenous Q2H  PRN Maurilio Lovely D, DO   0.5 mg at 07/06/23 1302   ipratropium-albuterol (DUONEB) 0.5-2.5 (3) MG/3ML nebulizer solution 3 mL  3 mL Nebulization Q6H Shah, Pratik D, DO   3 mL at 07/06/23 0757   mometasone-formoterol (DULERA) 200-5 MCG/ACT inhaler 2 puff  2 puff Inhalation BID Sherryll Burger, Pratik D, DO   2 puff at 07/06/23 1002   ondansetron (ZOFRAN) tablet 4 mg  4 mg Oral Q6H PRN Sherryll Burger, Pratik D, DO       Or   ondansetron (ZOFRAN) injection 4 mg  4 mg Intravenous Q6H PRN Sherryll Burger, Pratik D, DO   4 mg at 07/06/23 1302   sodium bicarbonate tablet 650 mg  650 mg Oral TID Maurilio Lovely D, DO   650 mg at 07/06/23 1154   tamsulosin (FLOMAX) capsule 0.4 mg  0.4 mg Oral QPC supper Maurilio Lovely D, DO   0.4 mg at 07/05/23 2142   Current Outpatient Medications   Medication Sig Dispense Refill   albuterol (PROVENTIL HFA) 108 (90 Base) MCG/ACT inhaler INHALE 2 PUFFS BY MOUTH EVERY 6 HOURS AS NEEDED FOR COUGHING, WHEEZING, OR SHORTNESS OF BREATH (Patient taking differently: Inhale 2 puffs into the lungs every 6 (six) hours as needed for shortness of breath or wheezing.) 18 g 1   aspirin EC 81 MG tablet Take 81 mg by mouth daily.     atorvastatin (LIPITOR) 20 MG tablet Take 20 mg by mouth daily.     cyanocobalamin (VITAMIN B12) 1000 MCG/ML injection Inject 1,000 mcg into the muscle every 30 (thirty) days.     feeding supplement (ENSURE ENLIVE / ENSURE PLUS) LIQD Take 237 mLs by mouth 3 (three) times daily between meals. 237 mL 12   furosemide (LASIX) 20 MG tablet TAKE 2 TABLETS BY MOUTH EVERY MORNING AND 1 TABLET IN THE AFTERNOON (Patient taking differently: Take 20 mg by mouth See admin instructions. Take 40 mg in the morning and 20 mg every afternoon) 90 tablet 3   metoprolol tartrate (LOPRESSOR) 25 MG tablet Take 25 mg by mouth 2 (two) times daily.     mometasone-formoterol (DULERA) 200-5 MCG/ACT AERO Inhale 2 puffs into the lungs 2 (two) times daily. 13 g 3   ondansetron (ZOFRAN) 4 MG tablet Take 1 tablet (4 mg total) by mouth daily as needed for nausea or vomiting. 30 tablet 1   pantoprazole (PROTONIX) 40 MG tablet TAKE 1 TABLET(40 MG) BY MOUTH DAILY (Patient taking differently: Take 40 mg by mouth daily.) 90 tablet 3   sodium bicarbonate 650 MG tablet Take 1 tablet (650 mg total) by mouth 3 (three) times daily. 90 tablet 1   spironolactone (ALDACTONE) 50 MG tablet TAKE 2 TABLETS BY MOUTH EVERY MORNING AND 1 TABLET DAILY IN THE AFTERNOON (Patient taking differently: Take 50 mg by mouth See admin instructions. Take 100 mg in the morning and 50 mg in the afternoon) 180 tablet 2   tamsulosin (FLOMAX) 0.4 MG CAPS capsule Take 1 capsule (0.4 mg total) by mouth daily after supper. 30 capsule 2    Allergies as of 07/05/2023   (No Known Allergies)    Review  of Systems  Constitutional:  Positive for weight loss. Negative for chills and fever.  HENT:  Negative for hearing loss and tinnitus.   Eyes:  Negative for blurred vision and double vision.  Respiratory:  Negative for cough and hemoptysis.   Cardiovascular:  Negative for chest pain and orthopnea.  Gastrointestinal:  Positive for abdominal pain, nausea and vomiting.  Negative for blood in stool, constipation, diarrhea, heartburn and melena.  Genitourinary:  Negative for dysuria and urgency.  Musculoskeletal:  Negative for myalgias and neck pain.  Skin:  Negative for itching and rash.  Neurological:  Negative for seizures and loss of consciousness.  Psychiatric/Behavioral:  Negative for depression and suicidal ideas.        Physical Exam:  Vital signs in last 24 hours: Temp:  [94.1 F (34.5 C)-97.7 F (36.5 C)] 97.7 F (36.5 C) (03/07 0500) Pulse Rate:  [87-122] 117 (03/07 1217) Resp:  [17-27] 26 (03/07 1217) BP: (87-138)/(48-76) 114/48 (03/07 1100) SpO2:  [97 %-100 %] 100 % (03/07 1217)   Last BM recorded by nurses in past 5 days No data recorded  Physical Exam Constitutional:      Appearance: He is ill-appearing.  HENT:     Head: Normocephalic and atraumatic.     Nose: Nose normal. No congestion.     Mouth/Throat:     Mouth: Mucous membranes are moist.     Pharynx: Oropharynx is clear.  Eyes:     General: Scleral icterus present.     Extraocular Movements: Extraocular movements intact.  Cardiovascular:     Rate and Rhythm: Regular rhythm. Tachycardia present.  Pulmonary:     Comments: Increased respiratory effort, on [place] Abdominal:     General: Bowel sounds are normal. There is distension.     Palpations: There is no mass.     Tenderness: There is no abdominal tenderness.     Hernia: No hernia is present.  Musculoskeletal:        General: Normal range of motion.     Cervical back: Normal range of motion and neck supple.  Skin:    General: Skin is warm and dry.   Neurological:     General: No focal deficit present.     Mental Status: He is alert and oriented to person, place, and time.  Psychiatric:        Mood and Affect: Mood normal.        Behavior: Behavior normal.        Thought Content: Thought content normal.        Judgment: Judgment normal.      LAB RESULTS: Recent Labs    07/05/23 0615 07/06/23 0440  WBC 8.0 15.3*  HGB 14.1 11.0*  HCT 44.1 36.7*  PLT 225 164   BMET Recent Labs    07/05/23 0615 07/06/23 0440 07/06/23 1001  NA 129* 133* 132*  K 5.4* 6.7* 5.8*  CL 97* 103 99  CO2 21* 14* 14*  GLUCOSE 106* 21* 214*  BUN 36* 38* 40*  CREATININE 3.63* 4.39* 4.81*  CALCIUM 8.4* 7.1* 7.2*   LFT Recent Labs    07/06/23 0440  PROT 7.1  ALBUMIN 2.4*  AST 5,021*  ALT 1,709*  ALKPHOS 85  BILITOT 3.7*   PT/INR Recent Labs    07/05/23 0750 07/06/23 0440  LABPROT 26.0* 50.4*  INR 2.4* 5.5*    STUDIES: [place] ASCITES (ABDOMEN LIMITED) Result Date: 07/06/2023 CLINICAL DATA:  Elevated LFTs. Abdominal distension. Paracentesis was requested. EXAM: LIMITED ABDOMEN ULTRASOUND FOR ASCITES TECHNIQUE: Limited ultrasound survey for ascites was performed in all four abdominal quadrants. COMPARISON:  Hepatic ultrasound 07/05/2023. CT of the abdomen pelvis 07/05/2023. FINDINGS: No significant abdominal ascites are present. The patient is not a candidate for paracentesis. IMPRESSION: No significant abdominal ascites. The patient is not a candidate for paracentesis. Electronically Signed   By: Virl Son.D.  On: 07/06/2023 11:57   US ABDOMEN LIMITED WITH LIVER DOPPLER Result Date: 07/05/2023 CLINICAL DATA:  Abdominal pain.  Known cirrhosis. EXAM: DUPLEX ULTRASOUND OF LIVER TECHNIQUE: Color and duplex Doppler ultrasound was performed to evaluate the hepatic in-flow and out-flow vessels. COMPARISON:  CT 07/05/2023 earlier.  Noncontrast.  MRI 04/12/2023 FINDINGS: Liver: Nodular appearance of the liver. Please correlate with known  history. Gallbladder is distended with some wall thickening and edema, nonspecific in the presence of cirrhosis. Common duct measures 5 mm. Main Portal Vein size: 9 mm cm Portal Vein Velocities Main Prox:  35.7 cm/sec Main Mid: 29 cm/sec Main Dist:  31.2 cm/sec Right: 31.9 cm/sec, posterior branch of the right portal vein Left: 19.9 cm/sec Hepatic Vein Velocities Right:  54 cm/sec Middle:  27.5 cm/sec Left:  30 cm/sec IVC: Present and patent with normal respiratory phasicity. Hepatic Artery Velocity:  220.5 cm/sec Portal Vein Occlusion/Thrombus: No Ascites: Trace Varices: None IMPRESSION: Preserved hepatic vasculature. Appropriate direction flow. Relative increased flow along the a [place] artery. Electronically Signed   By: Karen Kays M.D.   On: 07/05/2023 11:54   CT ABDOMEN PELVIS WO CONTRAST Result Date: 07/05/2023 CLINICAL DATA:  Lower abdominal pain.  Nausea vomiting. EXAM: CT ABDOMEN AND PELVIS WITHOUT CONTRAST TECHNIQUE: Multidetector CT imaging of the abdomen and pelvis was performed following the standard protocol without IV contrast. RADIATION DOSE REDUCTION: This exam was performed according to the departmental dose-optimization program which includes automated exposure control, adjustment of the mA and/or kV according to patient size and/or use of iterative reconstruction technique. COMPARISON:  03/11/2023 FINDINGS: Lower chest: No acute findings. Hepatobiliary: No suspicious focal abnormality in the liver on this study without intravenous contrast. The liver shows diffusely decreased attenuation suggesting fat deposition. Nodular liver contour is compatible with cirrhosis. Layering tiny calcified gallstones evident. No intrahepatic or extrahepatic biliary dilation. Pancreas: Pancreas is diffusely ill-defined in the region of the head and the tail with subtle peripancreatic edema/inflammation. No main duct dilatation. Spleen: No splenomegaly. No suspicious focal mass lesion. Adrenals/Urinary Tract:  No adrenal nodule or mass. Right kidney unremarkable. Left kidney atrophic. No evidence for hydroureter. The urinary bladder appears normal for the degree of distention. Stomach/Bowel: Stomach is nondistended, accentuating wall thickness. Duodenum is normally positioned as is the ligament of [place]itz. No small bowel wall thickening. No small bowel dilatation. The terminal ileum is normal. The appendix is normal. No gross colonic mass. No colonic wall thickening. Diverticular changes are noted in the left colon without evidence of diverticulitis. Vascular/Lymphatic: There is moderate atherosclerotic calcification of the abdominal aorta without aneurysm. There is no gastrohepatic or hepatoduodenal ligament lymphadenopathy. No retroperitoneal or mesenteric lymphadenopathy. No pelvic sidewall lymphadenopathy. Reproductive: The prostate gland and seminal vesicles are unremarkable. Other: No intraperitoneal free fluid. Musculoskeletal: No worrisome lytic or sclerotic osseous abnormality. IMPRESSION: 1. Pancreas is diffusely ill-defined through the tail region with subtle peripancreatic edema/inflammation. Imaging features compatible with acute pancreatitis. Pancreatic protocol CT recommended if renal function permits. If not, MRI of the abdomen with and without contrast recommended as pancreatic mass lesion cannot be excluded. 2. Hepatic cirrhosis with hepatic steatosis. 3. Cholelithiasis. 4. Left colonic diverticulosis without diverticulitis. 5. Atrophic left kidney. Electronically Signed   By: Kennith Center M.D.   On: 07/05/2023 07:48      Impression    58 y.o. male with a history of B12 deficiency, prior LLE DVT on ASA, asthma, CKD, COPD, kidney stones, cirrhosis 2/2 etoh, pancreatitis with hx of pancreas divisum also likely 2/2 etoh  and gallstones (not surgery candidate), gallbladder polyp, chronically elevated ferritin with hemosiderosis. H63D heterozygous (followed with hematology) who presented to the [facility] ED with complaints of abdominal pain with N/V/D 3-4 days prior to arrival. CT with pancreatitis and possible pancreatic mass and newly elevated transaminitis consistent with shock liver. Transferred to [facility] for ICU admission.  Acute on chronic pancreatitis Lipase 843 WBC 15.9 CT worsening acute pancreatitis, posterior mediastinal mass, inflammation of small and large bowel (likely hepatic coagulopathy) MRCP was ordered but not pursued No alcohol since early February, pancreatitis likely from gallstone versus history of pancreatic divisum  Elevated LFTs Suspect shock liver in the setting setting of sepsis possibly secondary to pancreatitis CMV, EBV, Hep B DNA and HEP C RNA pending AST 3980/ALT 1636, improving total bilirubin 5.4, worsening Liver doppler negative INR 9.9  Alcoholic Cirrhosis History of decompensation with ascites and hepatic encephalopathy.  No asterixis on exam.  Paracentesis was not pursued. Ethanol negative MELD 3.0: 42, MELD-Na: 45 3/8    Plan   - Continue to trend LFTs - Serial INR, daily - monitor daily MELD - Broad spectrum abx (with empiric coverage for SBP) + albumin bolus - vitamin K 10mg  po tid x3 - not enough ascites for paracentesis - continue meropenem - Continue Levophed - IgG4 to rule out autoimmune pancreatitis -- Repeat CT scan if concern for necrotizing pancreatitis, pseudocyst, or abscess or no improvement in 48 hours- consider IR or surgical consult.  -1 g/kg per day of Albumin (max 100 g) for 2 days.  - Lactulose, 30ml titrate to 3bms per day - further recommendations per Dr. Adela Lank  Thank you for your kind consultation, we will continue to follow.   Davey Bergsma Leanna Sato  07/06/2023, 2:09 PM

## 2023-07-06 NOTE — Progress Notes (Addendum)
 Gastroenterology Progress Note   Referring Provider: No ref. provider found Primary Care Physician:  Benetta Spar, MD Primary Gastroenterologist:  Gerrit Friends.Rourk, MD  Patient ID: [name] [name]; [id number]; 04-Jul-1965   Subjective   Feeling some mild chest discomfort and shortness of breath this morning. Also feeling a little shaky. Denies lower extremity edema. Again denies any alcohol use since February and denies any drug use other than marijuana and also denies any otc meds or herbs/supplements. Denies any new medications. Last BM 2 days ago.   Labs this morning with significantly elevated AST/ALT (AST has doubled). Bili stable and now with leukocytosis with WBC 15 although without fever. Blood glucose severely low this morning  Objective   Vital signs in last 24 hours Temp:  [94.1 F (34.5 C)-97.7 F (36.5 C)] 97.7 F (36.5 C) (03/07 0500) Pulse Rate:  [87-122] 97 (03/07 0800) Resp:  [17-27] 24 (03/07 0800) BP: (87-138)/(51-74) 138/74 (03/07 0800) SpO2:  [97 %-100 %] 100 % (03/07 0800)    Physical Exam General:   Alert and oriented, pleasant.  Head:  Normocephalic and atraumatic. Eyes:  mild icterus, sclera clear. Conjuctiva pink.  Mouth:  Without lesions, mucosa pink and moist.  Neck:  Supple, without thyromegaly or masses.  Abdomen:  Bowel sounds present. Distended, firm, mild umbilical hernia present. Moderate tenderness throughout abdomen. UTA for HSM. No rebound or guarding. No masses appreciated  Extremities:  Without clubbing or edema. Neurologic:  Alert and  oriented x4;  grossly normal neurologically. Mild tremor to BUE (no overt asterixis) Psych:  Alert and cooperative. Normal mood and affect.  Intake/Output from previous day: 03/06 0701 - 03/07 0700 In: 2792.5 [I.V.:792.5; IV Piggyback:2000] Out: -  Intake/Output this shift: No intake/output data recorded.  Lab Results  Recent Labs    07/05/23 0615 07/06/23 0440  WBC 8.0 15.3*  HGB 14.1  11.0*  HCT 44.1 36.7*  PLT 225 164   BMET Recent Labs    07/05/23 0615 07/06/23 0440  NA 129* 133*  K 5.4* 6.7*  CL 97* 103  CO2 21* 14*  GLUCOSE 106* 21*  BUN 36* 38*  CREATININE 3.63* 4.39*  CALCIUM 8.4* 7.1*   LFT Recent Labs    07/05/23 0615 07/06/23 0440  PROT 8.8* 7.1  ALBUMIN 2.8* 2.4*  AST 2,211* 5,021*  ALT 728* 1,709*  ALKPHOS 103 85  BILITOT 3.0* 3.7*   PT/INR Recent Labs    07/05/23 0750  LABPROT 26.0*  INR 2.4*   Hepatitis Panel Recent Labs    07/05/23 0924  HEPBSAG NON REACTIVE  HCVAB NON REACTIVE  HEPAIGM NON REACTIVE  HEPBIGM NON REACTIVE    Studies/Results US ABDOMEN LIMITED WITH LIVER DOPPLER Result Date: 07/05/2023 CLINICAL DATA:  Abdominal pain.  Known cirrhosis. EXAM: DUPLEX ULTRASOUND OF LIVER TECHNIQUE: Color and duplex Doppler ultrasound was performed to evaluate the hepatic in-flow and out-flow vessels. COMPARISON:  CT 07/05/2023 earlier.  Noncontrast.  MRI 04/12/2023 FINDINGS: Liver: Nodular appearance of the liver. Please correlate with known history. Gallbladder is distended with some wall thickening and edema, nonspecific in the presence of cirrhosis. Common duct measures 5 mm. Main Portal Vein size: 9 mm cm Portal Vein Velocities Main Prox:  35.7 cm/sec Main Mid: 29 cm/sec Main Dist:  31.2 cm/sec Right: 31.9 cm/sec, posterior branch of the right portal vein Left: 19.9 cm/sec Hepatic Vein Velocities Right:  54 cm/sec Middle:  27.5 cm/sec Left:  30 cm/sec IVC: Present and patent with normal respiratory phasicity. Hepatic Artery  Velocity:  220.5 cm/sec Portal Vein Occlusion/Thrombus: No Ascites: Trace Varices: None IMPRESSION: Preserved hepatic vasculature. Appropriate direction flow. Relative increased flow along the a [place] artery. Electronically Signed   By: Karen Kays M.D.   On: 07/05/2023 11:54   CT ABDOMEN PELVIS WO CONTRAST Result Date: 07/05/2023 CLINICAL DATA:  Lower abdominal pain.  Nausea vomiting. EXAM: CT ABDOMEN AND PELVIS  WITHOUT CONTRAST TECHNIQUE: Multidetector CT imaging of the abdomen and pelvis was performed following the standard protocol without IV contrast. RADIATION DOSE REDUCTION: This exam was performed according to the departmental dose-optimization program which includes automated exposure control, adjustment of the mA and/or kV according to patient size and/or use of iterative reconstruction technique. COMPARISON:  03/11/2023 FINDINGS: Lower chest: No acute findings. Hepatobiliary: No suspicious focal abnormality in the liver on this study without intravenous contrast. The liver shows diffusely decreased attenuation suggesting fat deposition. Nodular liver contour is compatible with cirrhosis. Layering tiny calcified gallstones evident. No intrahepatic or extrahepatic biliary dilation. Pancreas: Pancreas is diffusely ill-defined in the region of the head and the tail with subtle peripancreatic edema/inflammation. No main duct dilatation. Spleen: No splenomegaly. No suspicious focal mass lesion. Adrenals/Urinary Tract: No adrenal nodule or mass. Right kidney unremarkable. Left kidney atrophic. No evidence for hydroureter. The urinary bladder appears normal for the degree of distention. Stomach/Bowel: Stomach is nondistended, accentuating wall thickness. Duodenum is normally positioned as is the ligament of Treitz. No small bowel wall thickening. No small bowel dilatation. The terminal ileum is normal. The appendix is normal. No gross colonic mass. No colonic wall thickening. Diverticular changes are noted in the left colon without evidence of diverticulitis. Vascular/Lymphatic: There is moderate atherosclerotic calcification of the abdominal aorta without aneurysm. There is no gastrohepatic or hepatoduodenal ligament lymphadenopathy. No retroperitoneal or mesenteric lymphadenopathy. No pelvic sidewall lymphadenopathy. Reproductive: The prostate gland and seminal vesicles are unremarkable. Other: No intraperitoneal free  fluid. Musculoskeletal: No worrisome lytic or sclerotic osseous abnormality. IMPRESSION: 1. Pancreas is diffusely ill-defined through the tail region with subtle peripancreatic edema/inflammation. Imaging features compatible with acute pancreatitis. Pancreatic protocol CT recommended if renal function permits. If not, MRI of the abdomen with and without contrast recommended as pancreatic mass lesion cannot be excluded. 2. Hepatic cirrhosis with hepatic steatosis. 3. Cholelithiasis. 4. Left colonic diverticulosis without diverticulitis. 5. Atrophic left kidney. Electronically Signed   By: Kennith Center M.D.   On: 07/05/2023 07:48    Assessment  58 y.o. 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 H63D heterozygous (followed with hematology) who presented to the ED with complaints of abdominal pain with N/V/D 3-4 days prior to arrival. CT with pancreatitis and newly elevated transaminitis. GI consulted for further evaluation.   Acute pancreatitis: - lipase 843 - CT with ill defined pancreas and concern for possible pancreatic mass(need CT pancreatic protocol vs MRI abdomen) - known cholelithiasis with bump in LFTs as noted below - acute onset abdominal pain with N/V/D 3-4 days ago (diarrhea resolved, no BM in 2 days per patient) -last etoh use reported as 06/09/23 -documented prior MRI/MRCP with pancreas divisum -continuing supportive measures with hydration  Transaminitis: - Worsening LFTs today  - unable to exclude biliary pancreatitis or pancreatic mass although AP normal - no documented hypotension and liver doppler with  normal flow (ischemia less likely) - acute hepatitis panel negative.  -no new drugs, herbs, otc  per patient however given degree of elevation will start NAC - possible etiologies remain DILI, shock secondary to sepsis (pancreatitis) -UA with rare bacteria and some proteinuria, hopeful for follow up culture - given abdominal distention, tenderness throughout, and leukocytosis (WBC 15) will get diagnostic paracentesis -will need more albumin if evidence of SBP  Cirrhosis: - history of decompensation with ascites which was improved with reduction of etoh -maintained on diuretics and monitored closely by nephrology.  -INR elevated this admission, not previously elevated, Plts wnl.  -no HE currently, no asterixis -Hypoglycemia concerning -as noted above, need to attempt to r/o SBP -mild hypotension overnight, need to watch for HRS -MELD 3.0: 34 today (mostly driven by Cr), 30 yesterday and 18 in December   Plan / Recommendations  Start NAC for possible DILI MRI/MRCP Diagnostic para is enough ascites Other viral etiologies ordered (CMV, EBV, Hep B DNA and HEP C RNA) Blood cultures x2 Follow up urine culture Trend HFP closely Monitor for HE NPO until MRI (clear liquids after) Regular CBG checks Continue IV hydration and supportive measures for pancreatitis If no improvement in LFTs may need to consult tertiary center If creatinine worsens then may need to consider nephology consult as well  Hold diuretics for now   LOS: 1 day   07/06/2023, 8:38 AM  Brooke Bonito, MSN, FNP-BC, AGACNP-BC [facility]

## 2023-07-06 NOTE — Consult Note (Signed)
 [facility]  INPATIENT CONSULTATION  Reason for Consultation: AKI Requesting Provider: Dr. Sherryll Burger   HPI: [name] [name] is an 58 y.o. male HL, HTN, h/o kidney stones, alcoholic cirrhosis, hemosiderosis, HL, h/o DVT, COPD, ongoing tobacco, CKD3b use currently admitted for pancreatitis and ALI and nephrology is consulted for AKI.   Presented [facility] ED yesterday with several day h/o N/V, abd pain.  Ct without contrast showing acute pancreatitis and gallstones.  No kidney obstruction, L atrophic.   Initial BP 90/50s.   Labs with 129, K 5.4, Bicarb 14, BUN 36, Cr 3.6, AST 2211, ALT 728, bili 3 This AM labs Na 133, K 6.8, Bicarb 14, BUN 38, Cr 4.4, lipase 2929, AST 5021, ALT 1709.   Rec'd 2L NS, albumin challenge overnight and is on D5NS 100/hr currently.  Minimal UOP  here - voided 1 time overnight volume not recorded, has chronic weak stream. Says UOP at home PTA was normal, po intake of fluids was ok.   He was taking spironolactone 100mg  qam, lasix 40 qam prior to arrival.   Currently c/o abd distention without pain.  H/o LVPs last 1 year ago.  Fluid search [place] pending.    Outpt nephrologist [facility] Dr. Signe Colt.    PMH: Past Medical History:  Diagnosis Date   Asthma    B12 deficiency 03/15/2022   Cirrhosis (HCC)    Dyspnea    High cholesterol    History of kidney stones    Hypertension    Kidney stones    PSH: Past Surgical History:  Procedure Laterality Date   APPENDECTOMY     BIOPSY  07/22/2020   Procedure: BIOPSY;  Surgeon: Corbin Ade, MD;  [facility];  Service: Endoscopy;;   COLONOSCOPY WITH PROPOFOL N/A 04/18/2017   non-bleeding internal hemorrhoids, two 4-6 mm polyps in descending colon and cecum, pancolonic diverticulosis, single cecal AVM. Tubular adenomas, surveillance in 2023.    COLONOSCOPY WITH PROPOFOL N/A 04/13/2022   pancolonic diverticulosis, multiple polyps, one which was 11 mm (tubular adenomas). Surveillance in 3 years.    ESOPHAGOGASTRODUODENOSCOPY (EGD) WITH PROPOFOL N/A 07/22/2020   normal esophagus, small hiatal hernia, abnormal gastric mucosa, abnormal appearing ampula and periampullary mucosa. Mild chronic gastritis.   ESOPHAGOGASTRODUODENOSCOPY (EGD) WITH PROPOFOL N/A 09/08/2021   normal esophagus, retained food in stomach and duodenum precluded complete examination.   FRACTURE SURGERY     left arm   IR PARACENTESIS  05/31/2021   LOWER EXTREMITY VENOGRAPHY N/A 08/10/2020   Procedure: LOWER EXTREMITY VENOGRAPHY;  Surgeon: Maeola Harman, MD;  Location: [facility] INVASIVE CV LAB;  Service: Cardiovascular;  Laterality: N/A;   PERIPHERAL VASCULAR BALLOON ANGIOPLASTY Left 08/10/2020   Procedure: PERIPHERAL VASCULAR BALLOON ANGIOPLASTY;  Surgeon: Maeola Harman, MD;  Location: [facility] INVASIVE CV LAB;  Service: Cardiovascular;  Laterality: Left;  lower extremity venous   PERIPHERAL VASCULAR THROMBECTOMY N/A 08/10/2020   Procedure: PERIPHERAL VASCULAR THROMBECTOMY;  Surgeon: Maeola Harman, MD;  Location: [facility] INVASIVE CV LAB;  Service: Cardiovascular;  Laterality: N/A;   POLYPECTOMY  04/18/2017   Procedure: POLYPECTOMY;  Surgeon: Corbin Ade, MD;  [facility];  Service: Endoscopy;;   POLYPECTOMY  04/13/2022   Procedure: POLYPECTOMY;  Surgeon: Corbin Ade, MD;  [facility]: [facility];  Service: Endoscopy;;   Past Medical History:  Diagnosis Date   Asthma    B12 deficiency 03/15/2022   Cirrhosis (HCC)    Dyspnea    High cholesterol    History of kidney  stones    Hypertension    Kidney stones     Medications:  I have reviewed the patient's current medications.  (Not in a hospital admission)   ALLERGIES:  No Known Allergies  FAM HX: Family History  Problem Relation Age of Onset   Cancer Father        throat   Diabetes Sister    Colon cancer Neg Hx     Social History:   reports that he has been smoking cigarettes. He has a 16.5 pack-year smoking  history. He has been exposed to tobacco smoke. He has never used smokeless tobacco. He reports that he does not currently use alcohol. He reports that he does not use drugs.  ROS: 12 system Ros   Blood pressure 134/76, pulse 90, temperature 97.7 F (36.5 C), temperature source Oral, resp. rate (!) 26, height 6' (1.829 m), weight 73.9 kg, SpO2 99%. PHYSICAL EXAM: Gen: chronically ill appearing man who is nontoxic  Eyes: mild scleral icterus, EOMI ENT: poor dentition, MM tacky Neck: supple CV: RRR, no rub Abd:  soft, mod distended, nontender Back: lungs clear GU: no foley Extr:  no edema Neuro: nonfocal    Results for orders placed or performed during the hospital encounter of 07/05/23 (from the past 48 hours)  Lipase, blood     Status: Abnormal   Collection Time: 07/05/23  6:15 AM  Result Value Ref Range   Lipase 843 (H) 11 - 51 U/L    Comment: RESULTS CONFIRMED BY MANUAL DILUTION Performed at [facility], [address] [place], [place] [address]   Comprehensive metabolic panel     Status: Abnormal   Collection Time: 07/05/23  6:15 AM  Result Value Ref Range   Sodium 129 (L) 135 - 145 mmol/L   Potassium 5.4 (H) 3.5 - 5.1 mmol/L   Chloride 97 (L) 98 - 111 mmol/L   CO2 21 (L) 22 - 32 mmol/L   Glucose, Bld 106 (H) 70 - 99 mg/dL    Comment: Glucose reference range applies only to samples taken after fasting for at least 8 hours.   BUN 36 (H) 6 - 20 mg/dL   Creatinine, Ser 4.78 (H) 0.61 - 1.24 mg/dL   Calcium 8.4 (L) 8.9 - 10.3 mg/dL   Total Protein 8.8 (H) 6.5 - 8.1 g/dL   Albumin 2.8 (L) 3.5 - 5.0 g/dL   AST 2,956 (H) 15 - 41 U/L   ALT 728 (H) 0 - 44 U/L   Alkaline Phosphatase 103 38 - 126 U/L   Total Bilirubin 3.0 (H) 0.0 - 1.2 mg/dL   GFR, Estimated 19 (L) >60 mL/min    Comment: (NOTE) Calculated using the CKD-EPI Creatinine Equation (2021)    Anion gap 11 5 - 15    Comment: Performed at [facility], [address] [place], [place] [address]  CBC     Status:  Abnormal   Collection Time: 07/05/23  6:15 AM  Result Value Ref Range   WBC 8.0 4.0 - 10.5 K/uL   RBC 4.82 4.22 - 5.81 MIL/uL   Hemoglobin 14.1 13.0 - 17.0 g/dL   HCT 65.7 84.6 - 96.2 %   MCV 91.5 80.0 - 100.0 fL   MCH 29.3 26.0 - 34.0 pg   MCHC 32.0 30.0 - 36.0 g/dL   RDW 95.2 (H) 84.1 - 32.4 %   Platelets 225 150 - 400 K/uL   nRBC 0.0 0.0 - 0.2 %    Comment: Performed at [facility], [address], [place], [place] [address]  Protime-INR     Status: Abnormal   Collection Time: 07/05/23  7:50 AM  Result Value Ref Range   Prothrombin Time 26.0 (H) 11.4 - 15.2 seconds   INR 2.4 (H) 0.8 - 1.2    Comment: (NOTE) INR goal varies based on device and disease states. Performed at [facility], [address], [place], [place] [address]   Hepatitis panel, acute     Status: None   Collection Time: 07/05/23  9:24 AM  Result Value Ref Range   Hepatitis B Surface Ag NON REACTIVE NON REACTIVE   HCV Ab NON REACTIVE NON REACTIVE    Comment: (NOTE) Nonreactive HCV antibody screen is consistent with no HCV infections,  unless recent infection is suspected or other evidence exists to indicate HCV infection.     Hep A IgM NON REACTIVE NON REACTIVE   Hep B C IgM NON REACTIVE NON REACTIVE    Comment: Performed at [facility], [address] [place], [place] [address]  Magnesium     Status: Abnormal   Collection Time: 07/06/23  4:40 AM  Result Value Ref Range   Magnesium 1.6 (L) 1.7 - 2.4 mg/dL    Comment: Performed at [facility], [address], [place], [place] [address]  Comprehensive metabolic panel     Status: Abnormal   Collection Time: 07/06/23  4:40 AM  Result Value Ref Range   Sodium 133 (L) 135 - 145 mmol/L   Potassium 6.7 (HH) 3.5 - 5.1 mmol/L    Comment: CRITICAL RESULT CALLED TO, READ BACK BY AND VERIFIED WITH NICHOLS,K AT 6:10AM ON 07/06/23 BY FESTERMAN,C   Chloride 103 98 - 111 mmol/L   CO2 14 (L) 22 - 32 mmol/L   Glucose, Bld 21 (LL) 70 - 99 mg/dL    Comment:  CRITICAL RESULT CALLED TO, READ BACK BY AND VERIFIED WITH NICHOLS,K AT 6:10AM ON 07/06/23 BY FESTERMAN,C Glucose reference range applies only to samples taken after fasting for at least 8 hours.    BUN 38 (H) 6 - 20 mg/dL   Creatinine, Ser 4.40 (H) 0.61 - 1.24 mg/dL   Calcium 7.1 (L) 8.9 - 10.3 mg/dL   Total Protein 7.1 6.5 - 8.1 g/dL   Albumin 2.4 (L) 3.5 - 5.0 g/dL   AST 3,474 (H) 15 - 41 U/L    Comment: RESULT CONFIRMED BY MANUAL DILUTION   ALT 1,709 (H) 0 - 44 U/L   Alkaline Phosphatase 85 38 - 126 U/L   Total Bilirubin 3.7 (H) 0.0 - 1.2 mg/dL   GFR, Estimated 15 (L) >60 mL/min    Comment: (NOTE) Calculated using the CKD-EPI Creatinine Equation (2021)    Anion gap 16 (H) 5 - 15    Comment: Performed at [facility], [address], [place], [place] [address]  CBC     Status: Abnormal   Collection Time: 07/06/23  4:40 AM  Result Value Ref Range   WBC 15.3 (H) 4.0 - 10.5 K/uL   RBC 3.80 (L) 4.22 - 5.81 MIL/uL   Hemoglobin 11.0 (L) 13.0 - 17.0 g/dL   HCT 38.7 (L) 56.4 - 33.2 %   MCV 96.6 80.0 - 100.0 fL   MCH 28.9 26.0 - 34.0 pg   MCHC 30.0 30.0 - 36.0 g/dL   RDW 95.1 (H) 88.4 - 16.6 %   Platelets 164 150 - 400 K/uL   nRBC 0.0 0.0 - 0.2 %    Comment: Performed  at [facility], [address], [place], [place] [address]  Lipase, blood     Status: Abnormal   Collection Time: 07/06/23  4:40 AM  Result Value Ref Range   Lipase 2,929 (H) 11 - 51 U/L    Comment: RESULTS CONFIRMED BY MANUAL DILUTION Performed at [facility], [address], [place], [place] [address]   Protime-INR     Status: Abnormal   Collection Time: 07/06/23  4:40 AM  Result Value Ref Range   Prothrombin Time 50.4 (H) 11.4 - 15.2 seconds   INR 5.5 (HH) 0.8 - 1.2    Comment: REPEATED TO VERIFY CRITICAL RESULT CALLED TO, READ BACK BY AND VERIFIED WITH: Mosetta Anis, RN AT [phone] 07/06/2023 BY A. SNYDER (NOTE) INR goal varies based on device and disease states. Performed at [facility], [address]  [place], [place] [address]   Urinalysis, Routine w reflex microscopic -Urine, Clean Catch     Status: Abnormal   Collection Time: 07/06/23  5:00 AM  Result Value Ref Range   Color, Urine AMBER (A) YELLOW    Comment: BIOCHEMICALS MAY BE AFFECTED BY COLOR   APPearance CLOUDY (A) CLEAR   Specific Gravity, Urine 1.021 1.005 - 1.030   pH 5.0 5.0 - 8.0   Glucose, UA NEGATIVE NEGATIVE mg/dL   Hgb urine dipstick NEGATIVE NEGATIVE   Bilirubin Urine NEGATIVE NEGATIVE   Ketones, ur NEGATIVE NEGATIVE mg/dL   Protein, ur 829 (A) NEGATIVE mg/dL   Nitrite NEGATIVE NEGATIVE   Leukocytes,Ua NEGATIVE NEGATIVE   RBC / HPF 0-5 0 - 5 RBC/hpf   WBC, UA 6-10 0 - 5 WBC/hpf   Bacteria, UA RARE (A) NONE SEEN   Squamous Epithelial / HPF 6-10 0 - 5 /HPF   Mucus PRESENT    Budding Yeast PRESENT    Hyaline Casts, UA PRESENT     Comment: Performed at [facility], [address], [place], [place] [address]  CBG monitoring, ED     Status: Abnormal   Collection Time: 07/06/23  6:46 AM  Result Value Ref Range   Glucose-Capillary <10 (LL) 70 - 99 mg/dL    Comment: Glucose reference range applies only to samples taken after fasting for at least 8 hours.  CBG monitoring, ED     Status: Abnormal   Collection Time: 07/06/23  7:16 AM  Result Value Ref Range   Glucose-Capillary 120 (H) 70 - 99 mg/dL    Comment: Glucose reference range applies only to samples taken after fasting for at least 8 hours.  CBG monitoring, ED     Status: None   Collection Time: 07/06/23  9:21 AM  Result Value Ref Range   Glucose-Capillary 84 70 - 99 mg/dL    Comment: Glucose reference range applies only to samples taken after fasting for at least 8 hours.    US ABDOMEN LIMITED WITH LIVER DOPPLER Result Date: 07/05/2023 CLINICAL DATA:  Abdominal pain.  Known cirrhosis. EXAM: DUPLEX ULTRASOUND OF LIVER TECHNIQUE: Color and duplex Doppler ultrasound was performed to evaluate the hepatic in-flow and out-flow vessels. COMPARISON:  CT 07/05/2023  earlier.  Noncontrast.  MRI 04/12/2023 FINDINGS: Liver: Nodular appearance of the liver. Please correlate with known history. Gallbladder is distended with some wall thickening and edema, nonspecific in the presence of cirrhosis. Common duct measures 5 mm. Main Portal Vein size: 9 mm cm Portal Vein Velocities Main Prox:  35.7 cm/sec Main Mid: 29 cm/sec Main Dist:  31.2 cm/sec Right: 31.9 cm/sec, posterior branch of the  right portal vein Left: 19.9 cm/sec Hepatic Vein Velocities Right:  54 cm/sec Middle:  27.5 cm/sec Left:  30 cm/sec IVC: Present and patent with normal respiratory phasicity. Hepatic Artery Velocity:  220.5 cm/sec Portal Vein Occlusion/Thrombus: No Ascites: Trace Varices: None IMPRESSION: Preserved hepatic vasculature. Appropriate direction flow. Relative increased flow along the a [place] artery. Electronically Signed   By: Karen Kays M.D.   On: 07/05/2023 11:54   CT ABDOMEN PELVIS WO CONTRAST Result Date: 07/05/2023 CLINICAL DATA:  Lower abdominal pain.  Nausea vomiting. EXAM: CT ABDOMEN AND PELVIS WITHOUT CONTRAST TECHNIQUE: Multidetector CT imaging of the abdomen and pelvis was performed following the standard protocol without IV contrast. RADIATION DOSE REDUCTION: This exam was performed according to the departmental dose-optimization program which includes automated exposure control, adjustment of the mA and/or kV according to patient size and/or use of iterative reconstruction technique. COMPARISON:  03/11/2023 FINDINGS: Lower chest: No acute findings. Hepatobiliary: No suspicious focal abnormality in the liver on this study without intravenous contrast. The liver shows diffusely decreased attenuation suggesting fat deposition. Nodular liver contour is compatible with cirrhosis. Layering tiny calcified gallstones evident. No intrahepatic or extrahepatic biliary dilation. Pancreas: Pancreas is diffusely ill-defined in the region of the head and the tail with subtle peripancreatic  edema/inflammation. No main duct dilatation. Spleen: No splenomegaly. No suspicious focal mass lesion. Adrenals/Urinary Tract: No adrenal nodule or mass. Right kidney unremarkable. Left kidney atrophic. No evidence for hydroureter. The urinary bladder appears normal for the degree of distention. Stomach/Bowel: Stomach is nondistended, accentuating wall thickness. Duodenum is normally positioned as is the ligament of Treitz. No small bowel wall thickening. No small bowel dilatation. The terminal ileum is normal. The appendix is normal. No gross colonic mass. No colonic wall thickening. Diverticular changes are noted in the left colon without evidence of diverticulitis. Vascular/Lymphatic: There is moderate atherosclerotic calcification of the abdominal aorta without aneurysm. There is no gastrohepatic or hepatoduodenal ligament lymphadenopathy. No retroperitoneal or mesenteric lymphadenopathy. No pelvic sidewall lymphadenopathy. Reproductive: The prostate gland and seminal vesicles are unremarkable. Other: No intraperitoneal free fluid. Musculoskeletal: No worrisome lytic or sclerotic osseous abnormality. IMPRESSION: 1. Pancreas is diffusely ill-defined through the tail region with subtle peripancreatic edema/inflammation. Imaging features compatible with acute pancreatitis. Pancreatic protocol CT recommended if renal function permits. If not, MRI of the abdomen with and without contrast recommended as pancreatic mass lesion cannot be excluded. 2. Hepatic cirrhosis with hepatic steatosis. 3. Cholelithiasis. 4. Left colonic diverticulosis without diverticulitis. 5. Atrophic left kidney. Electronically Signed   By: Kennith Center M.D.   On: 07/05/2023 07:48    Assessment/Plan **Acute pancreatitis, gallstone, possible mass:  IVF, pain meds per primary.  GI following.  Had previous episode of gallstone pancreatitis and eval by surgery who felt too risky. GI planning MRCP.   **AKI, severe, oliguric on CKD 3b:    suspect ischemic ATN in the setting of hypotension with pancreatitis, hypovolemia.  Imaging no obstruction.  Check UA and urine lytes   Pt is oliguric with multiple severe electrolytes derangements despite aggressive volume expansion over the past 24h.  In setting of his advanced baseline CKD suspect he'll continue to worsen in coming 24h.  Recommended placement of temp HD catheter and HD today.  He is agreeable.  Appreciate surgery placing line.   Hold diuretics and antiHTN for now.  Follow daily labs, strict I/Os.   No s/s volume overload currently so ok to continue IVF but low threshhold to d/c.   **Hyperkalemia:  trending up  despite lokelma, now 6.7.  Finishing a dose of lokelma now.  Manage with dialysis.   **AGMA:  in setting of severe AKI.  Will manage with dialysis.  Can give IV bicarb pushes in meantime.     **Anemia: Hb 11. CTM  **HTN:  meds on hold in setting of hypotension and AKI.   **EtOH cirrhosis: says no EtOH since 06/2023.  GI following.  Ascites scan pending.  With significant transaminitis he's on N-AC infusion currently too.   **Anemia:  Hb 11, follow.   **tobacco use: ongoing.   Will follow along closely.  Please reach out with any questions or concerns.   Tyler Pita 07/06/2023, 11:24 AM

## 2023-07-06 NOTE — Consult Note (Signed)
 [facility] Consult  Reason for Consult: Need for temporary dialysis catheter Referring Physician: Dr. Glenna Fellows  Chief Complaint   Abdominal Pain; Nausea     HPI: [name] [name] is a 58 y.o. 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  His blood work today demonstrated worsening creatinine, LFTs, and INR.  Nephrology was consulted, and recommended temporary dialysis catheter be placed for dialysis.  He complains of chronic abdominal pain with nausea.  He has not had any episodes of emesis.  His medical history significant for cirrhosis secondary to alcohol use, hyperlipidemia, chronic kidney disease, and hypertension.  He was previously diagnosed with cholelithiasis, and was evaluated by [facility] surgery in January 2024 and was felt to not be a candidate for cholecystectomy given his cirrhosis.  His abdominal surgeries are significant for appendectomy.  He denies ever having any vascular procedures, but on EMR review, he does have a history of peripheral vascular thrombectomy and balloon angioplasty secondary to a left lower extremity DVT.  In the ED, he underwent a CT of the abdomen and pelvis which demonstrated peripancreatic edema/inflammation consistent with acute pancreatitis, hepatic cirrhosis, and cholelithiasis.  Today, his INR is 5.5 with lipase of 3000, AST of 5000, ALT 1700, and total bilirubin of 3.7.  Alkaline phosphatase is normal at 85.  He also was noted to be hyperkalemic with creatinine of 4.81.  Past Medical History:  Diagnosis Date   Asthma    B12 deficiency 03/15/2022   Cirrhosis (HCC)    Dyspnea    High cholesterol    History of kidney stones    Hypertension    Kidney stones     Past Surgical History:  Procedure Laterality Date   APPENDECTOMY     BIOPSY  07/22/2020   Procedure: BIOPSY;  Surgeon: Corbin Ade, MD;  [facility];  Service: Endoscopy;;    COLONOSCOPY WITH PROPOFOL N/A 04/18/2017   non-bleeding internal hemorrhoids, two 4-6 mm polyps in descending colon and cecum, pancolonic diverticulosis, single cecal AVM. Tubular adenomas, surveillance in 2023.    COLONOSCOPY WITH PROPOFOL N/A 04/13/2022   pancolonic diverticulosis, multiple polyps, one which was 11 mm (tubular adenomas). Surveillance in 3 years.   ESOPHAGOGASTRODUODENOSCOPY (EGD) WITH PROPOFOL N/A 07/22/2020   normal esophagus, small hiatal hernia, abnormal gastric mucosa, abnormal appearing ampula and periampullary mucosa. Mild chronic gastritis.   ESOPHAGOGASTRODUODENOSCOPY (EGD) WITH PROPOFOL N/A 09/08/2021   normal esophagus, retained food in stomach and duodenum precluded complete examination.   FRACTURE SURGERY     left arm   IR PARACENTESIS  05/31/2021   LOWER EXTREMITY VENOGRAPHY N/A 08/10/2020   Procedure: LOWER EXTREMITY VENOGRAPHY;  Surgeon: Maeola Harman, MD;  Location: [facility] INVASIVE CV LAB;  Service: Cardiovascular;  Laterality: N/A;   PERIPHERAL VASCULAR BALLOON ANGIOPLASTY Left 08/10/2020   Procedure: PERIPHERAL VASCULAR BALLOON ANGIOPLASTY;  Surgeon: Maeola Harman, MD;  Location: [facility] INVASIVE CV LAB;  Service: Cardiovascular;  Laterality: Left;  lower extremity venous   PERIPHERAL VASCULAR THROMBECTOMY N/A 08/10/2020   Procedure: PERIPHERAL VASCULAR THROMBECTOMY;  Surgeon: Maeola Harman, MD;  Location: [facility] INVASIVE CV LAB;  Service: Cardiovascular;  Laterality: N/A;   POLYPECTOMY  04/18/2017   Procedure: POLYPECTOMY;  Surgeon: Corbin Ade, MD;  [facility];  Service: Endoscopy;;   POLYPECTOMY  04/13/2022   Procedure: POLYPECTOMY;  Surgeon: Corbin Ade, MD;  [facility];  Service: Endoscopy;;  Family History  Problem Relation Age of Onset   Cancer Father        throat   Diabetes Sister    Colon cancer Neg Hx     Social History   Tobacco Use   Smoking status: Every Day    Current  packs/day: 0.50    Average packs/day: 0.5 packs/day for 33.0 years (16.5 ttl pk-yrs)    Types: Cigarettes    Passive exposure: Current   Smokeless tobacco: Never  Vaping Use   Vaping status: Never Used  Substance Use Topics   Alcohol use: Not Currently    Comment: last deink 1 week ago   Drug use: No    Medications: I have reviewed the patient's current medications.  No Known Allergies   ROS:  Pertinent items are noted in HPI.  Blood pressure (!) 114/48, pulse (!) 117, temperature 97.7 F (36.5 C), temperature source Oral, resp. rate (!) 26, height 6' (1.829 m), weight 73.9 kg, SpO2 100%. Physical Exam Vitals reviewed.  Constitutional:      Appearance: He is well-developed.  HENT:     Head: Normocephalic and atraumatic.  Eyes:     Extraocular Movements: Extraocular movements intact.     Pupils: Pupils are equal, round, and reactive to light.  Cardiovascular:     Rate and Rhythm: Tachycardia present.  Pulmonary:     Effort: Pulmonary effort is normal.  Abdominal:     Comments: Abdomen soft, moderate distention, no percussion tenderness, diffuse tenderness to palpation; no rigidity, guarding, rebound tenderness  Musculoskeletal:     Comments: Bilateral 2+ femoral pulses  Skin:    General: Skin is warm and dry.  Neurological:     General: No focal deficit present.     Mental Status: He is alert and oriented to person, place, and time.  Psychiatric:        Mood and Affect: Mood normal.        Behavior: Behavior normal.     Results: Results for orders placed or performed during the hospital encounter of 07/05/23 (from the past 48 hours)  Lipase, blood     Status: Abnormal   Collection Time: 07/05/23  6:15 AM  Result Value Ref Range   Lipase 843 (H) 11 - 51 U/L    Comment: RESULTS CONFIRMED BY MANUAL DILUTION Performed at [facility], [address] [place], [place] [address]   Comprehensive metabolic panel     Status: Abnormal   Collection Time: 07/05/23  6:15  AM  Result Value Ref Range   Sodium 129 (L) 135 - 145 mmol/L   Potassium 5.4 (H) 3.5 - 5.1 mmol/L   Chloride 97 (L) 98 - 111 mmol/L   CO2 21 (L) 22 - 32 mmol/L   Glucose, Bld 106 (H) 70 - 99 mg/dL    Comment: Glucose reference range applies only to samples taken after fasting for at least 8 hours.   BUN 36 (H) 6 - 20 mg/dL   Creatinine, Ser 6.04 (H) 0.61 - 1.24 mg/dL   Calcium 8.4 (L) 8.9 - 10.3 mg/dL   Total Protein 8.8 (H) 6.5 - 8.1 g/dL   Albumin 2.8 (L) 3.5 - 5.0 g/dL   AST 5,409 (H) 15 - 41 U/L   ALT 728 (H) 0 - 44 U/L   Alkaline Phosphatase 103 38 - 126 U/L   Total Bilirubin 3.0 (H) 0.0 - 1.2 mg/dL   GFR, Estimated 19 (L) >60 mL/min    Comment: (NOTE) Calculated using the  CKD-EPI Creatinine Equation (2021)    Anion gap 11 5 - 15    Comment: Performed at [facility], [address], [place], [place] [address]  CBC     Status: Abnormal   Collection Time: 07/05/23  6:15 AM  Result Value Ref Range   WBC 8.0 4.0 - 10.5 K/uL   RBC 4.82 4.22 - 5.81 MIL/uL   Hemoglobin 14.1 13.0 - 17.0 g/dL   HCT 19.1 47.8 - 29.5 %   MCV 91.5 80.0 - 100.0 fL   MCH 29.3 26.0 - 34.0 pg   MCHC 32.0 30.0 - 36.0 g/dL   RDW 62.1 (H) 30.8 - 65.7 %   Platelets 225 150 - 400 K/uL   nRBC 0.0 0.0 - 0.2 %    Comment: Performed at [facility], [address], [place], [place] [address]  Protime-INR     Status: Abnormal   Collection Time: 07/05/23  7:50 AM  Result Value Ref Range   Prothrombin Time 26.0 (H) 11.4 - 15.2 seconds   INR 2.4 (H) 0.8 - 1.2    Comment: (NOTE) INR goal varies based on device and disease states. Performed at [facility], [address], [place], [place] [address]   Hepatitis panel, acute     Status: None   Collection Time: 07/05/23  9:24 AM  Result Value Ref Range   Hepatitis B Surface Ag NON REACTIVE NON REACTIVE   HCV Ab NON REACTIVE NON REACTIVE    Comment: (NOTE) Nonreactive HCV antibody screen is consistent with no HCV infections,  unless recent infection is  suspected or other evidence exists to indicate HCV infection.     Hep A IgM NON REACTIVE NON REACTIVE   Hep B C IgM NON REACTIVE NON REACTIVE    Comment: Performed at [facility], [address] [place], [place] [address]  Magnesium     Status: Abnormal   Collection Time: 07/06/23  4:40 AM  Result Value Ref Range   Magnesium 1.6 (L) 1.7 - 2.4 mg/dL    Comment: Performed at [facility], [address] [place], [place] [address]  Comprehensive metabolic panel     Status: Abnormal   Collection Time: 07/06/23  4:40 AM  Result Value Ref Range   Sodium 133 (L) 135 - 145 mmol/L   Potassium 6.7 (HH) 3.5 - 5.1 mmol/L    Comment: CRITICAL RESULT CALLED TO, READ BACK BY AND VERIFIED WITH NICHOLS,K AT 6:10AM ON 07/06/23 BY FESTERMAN,C   Chloride 103 98 - 111 mmol/L   CO2 14 (L) 22 - 32 mmol/L   Glucose, Bld 21 (LL) 70 - 99 mg/dL    Comment: CRITICAL RESULT CALLED TO, READ BACK BY AND VERIFIED WITH NICHOLS,K AT 6:10AM ON 07/06/23 BY FESTERMAN,C Glucose reference range applies only to samples taken after fasting for at least 8 hours.    BUN 38 (H) 6 - 20 mg/dL   Creatinine, Ser 7.25 (H) 0.61 - 1.24 mg/dL   Calcium 7.1 (L) 8.9 - 10.3 mg/dL   Total Protein 7.1 6.5 - 8.1 g/dL   Albumin 2.4 (L) 3.5 - 5.0 g/dL   AST 3,664 (H) 15 - 41 U/L    Comment: RESULT CONFIRMED BY MANUAL DILUTION   ALT 1,709 (H) 0 - 44 U/L   Alkaline Phosphatase 85 38 - 126 U/L   Total Bilirubin 3.7 (H) 0.0 - 1.2 mg/dL   GFR, Estimated 15 (L) >60 mL/min    Comment: (NOTE) Calculated using the CKD-EPI Creatinine  Equation (2021)    Anion gap 16 (H) 5 - 15    Comment: Performed at [facility], [address] [place], [place] [address]  CBC     Status: Abnormal   Collection Time: 07/06/23  4:40 AM  Result Value Ref Range   WBC 15.3 (H) 4.0 - 10.5 K/uL   RBC 3.80 (L) 4.22 - 5.81 MIL/uL   Hemoglobin 11.0 (L) 13.0 - 17.0 g/dL   HCT 60.4 (L) 54.0 - 98.1 %   MCV 96.6 80.0 - 100.0 fL   MCH 28.9 26.0 - 34.0 pg    MCHC 30.0 30.0 - 36.0 g/dL   RDW 19.1 (H) 47.8 - 29.5 %   Platelets 164 150 - 400 K/uL   nRBC 0.0 0.0 - 0.2 %    Comment: Performed at [facility], [address] [place], [place] [address]  Lipase, blood     Status: Abnormal   Collection Time: 07/06/23  4:40 AM  Result Value Ref Range   Lipase 2,929 (H) 11 - 51 U/L    Comment: RESULTS CONFIRMED BY MANUAL DILUTION Performed at [facility], [address], [place], [place] [address]   Protime-INR     Status: Abnormal   Collection Time: 07/06/23  4:40 AM  Result Value Ref Range   Prothrombin Time 50.4 (H) 11.4 - 15.2 seconds   INR 5.5 (HH) 0.8 - 1.2    Comment: REPEATED TO VERIFY CRITICAL RESULT CALLED TO, READ BACK BY AND VERIFIED WITH: Mosetta Anis, RN AT [phone] 07/06/2023 BY A. SNYDER (NOTE) INR goal varies based on device and disease states. Performed at [facility], [address] [place], [place] [address]   Urinalysis, Routine w reflex microscopic -Urine, Clean Catch     Status: Abnormal   Collection Time: 07/06/23  5:00 AM  Result Value Ref Range   Color, Urine AMBER (A) YELLOW    Comment: BIOCHEMICALS MAY BE AFFECTED BY COLOR   APPearance CLOUDY (A) CLEAR   Specific Gravity, Urine 1.021 1.005 - 1.030   pH 5.0 5.0 - 8.0   Glucose, UA NEGATIVE NEGATIVE mg/dL   Hgb urine dipstick NEGATIVE NEGATIVE   Bilirubin Urine NEGATIVE NEGATIVE   Ketones, ur NEGATIVE NEGATIVE mg/dL   Protein, ur 413 (A) NEGATIVE mg/dL   Nitrite NEGATIVE NEGATIVE   Leukocytes,Ua NEGATIVE NEGATIVE   RBC / HPF 0-5 0 - 5 RBC/hpf   WBC, UA 6-10 0 - 5 WBC/hpf   Bacteria, UA RARE (A) NONE SEEN   Squamous Epithelial / HPF 6-10 0 - 5 /HPF   Mucus PRESENT    Budding Yeast PRESENT    Hyaline Casts, UA PRESENT     Comment: Performed at [facility], [address] [place], [place] [address]  CBG monitoring, ED     Status: Abnormal   Collection Time: 07/06/23  6:46 AM  Result Value Ref Range   Glucose-Capillary <10 (LL) 70 - 99 mg/dL    Comment: Glucose  reference range applies only to samples taken after fasting for at least 8 hours.  CBG monitoring, ED     Status: Abnormal   Collection Time: 07/06/23  7:16 AM  Result Value Ref Range   Glucose-Capillary 120 (H) 70 - 99 mg/dL    Comment: Glucose reference range applies only to samples taken after fasting for at least 8 hours.  CBG monitoring, ED     Status: None   Collection Time: 07/06/23  9:21 AM  Result Value Ref Range   Glucose-Capillary  84 70 - 99 mg/dL    Comment: Glucose reference range applies only to samples taken after fasting for at least 8 hours.  Basic metabolic panel     Status: Abnormal   Collection Time: 07/06/23 10:01 AM  Result Value Ref Range   Sodium 132 (L) 135 - 145 mmol/L   Potassium 5.8 (H) 3.5 - 5.1 mmol/L   Chloride 99 98 - 111 mmol/L   CO2 14 (L) 22 - 32 mmol/L   Glucose, Bld 214 (H) 70 - 99 mg/dL    Comment: Glucose reference range applies only to samples taken after fasting for at least 8 hours.   BUN 40 (H) 6 - 20 mg/dL   Creatinine, Ser 8.29 (H) 0.61 - 1.24 mg/dL   Calcium 7.2 (L) 8.9 - 10.3 mg/dL   GFR, Estimated 13 (L) >60 mL/min    Comment: (NOTE) Calculated using the CKD-EPI Creatinine Equation (2021)    Anion gap 19 (H) 5 - 15    Comment: Performed at [facility], [address], [place], [place] [address]    [place] ASCITES (ABDOMEN LIMITED) Result Date: 07/06/2023 CLINICAL DATA:  Elevated LFTs. Abdominal distension. Paracentesis was requested. EXAM: LIMITED ABDOMEN ULTRASOUND FOR ASCITES TECHNIQUE: Limited ultrasound survey for ascites was performed in all four abdominal quadrants. COMPARISON:  Hepatic ultrasound 07/05/2023. CT of the abdomen pelvis 07/05/2023. FINDINGS: No significant abdominal ascites are present. The patient is not a candidate for paracentesis. IMPRESSION: No significant abdominal ascites. The patient is not a candidate for paracentesis. Electronically Signed   By: Marin Roberts M.D.   On: 07/06/2023 11:57   US ABDOMEN  LIMITED WITH LIVER DOPPLER Result Date: 07/05/2023 CLINICAL DATA:  Abdominal pain.  Known cirrhosis. EXAM: DUPLEX ULTRASOUND OF LIVER TECHNIQUE: Color and duplex Doppler ultrasound was performed to evaluate the hepatic in-flow and out-flow vessels. COMPARISON:  CT 07/05/2023 earlier.  Noncontrast.  MRI 04/12/2023 FINDINGS: Liver: Nodular appearance of the liver. Please correlate with known history. Gallbladder is distended with some wall thickening and edema, nonspecific in the presence of cirrhosis. Common duct measures 5 mm. Main Portal Vein size: 9 mm cm Portal Vein Velocities Main Prox:  35.7 cm/sec Main Mid: 29 cm/sec Main Dist:  31.2 cm/sec Right: 31.9 cm/sec, posterior branch of the right portal vein Left: 19.9 cm/sec Hepatic Vein Velocities Right:  54 cm/sec Middle:  27.5 cm/sec Left:  30 cm/sec IVC: Present and patent with normal respiratory phasicity. Hepatic Artery Velocity:  220.5 cm/sec Portal Vein Occlusion/Thrombus: No Ascites: Trace Varices: None IMPRESSION: Preserved hepatic vasculature. Appropriate direction flow. Relative increased flow along the a [place] artery. Electronically Signed   By: Karen Kays M.D.   On: 07/05/2023 11:54   CT ABDOMEN PELVIS WO CONTRAST Result Date: 07/05/2023 CLINICAL DATA:  Lower abdominal pain.  Nausea vomiting. EXAM: CT ABDOMEN AND PELVIS WITHOUT CONTRAST TECHNIQUE: Multidetector CT imaging of the abdomen and pelvis was performed following the standard protocol without IV contrast. RADIATION DOSE REDUCTION: This exam was performed according to the departmental dose-optimization program which includes automated exposure control, adjustment of the mA and/or kV according to patient size and/or use of iterative reconstruction technique. COMPARISON:  03/11/2023 FINDINGS: Lower chest: No acute findings. Hepatobiliary: No suspicious focal abnormality in the liver on this study without intravenous contrast. The liver shows diffusely decreased attenuation suggesting fat  deposition. Nodular liver contour is compatible with cirrhosis. Layering tiny calcified gallstones evident. No intrahepatic or extrahepatic biliary dilation. Pancreas: Pancreas is diffusely ill-defined in the region of the  head and the tail with subtle peripancreatic edema/inflammation. No main duct dilatation. Spleen: No splenomegaly. No suspicious focal mass lesion. Adrenals/Urinary Tract: No adrenal nodule or mass. Right kidney unremarkable. Left kidney atrophic. No evidence for hydroureter. The urinary bladder appears normal for the degree of distention. Stomach/Bowel: Stomach is nondistended, accentuating wall thickness. Duodenum is normally positioned as is the ligament of Treitz. No small bowel wall thickening. No small bowel dilatation. The terminal ileum is normal. The appendix is normal. No gross colonic mass. No colonic wall thickening. Diverticular changes are noted in the left colon without evidence of diverticulitis. Vascular/Lymphatic: There is moderate atherosclerotic calcification of the abdominal aorta without aneurysm. There is no gastrohepatic or hepatoduodenal ligament lymphadenopathy. No retroperitoneal or mesenteric lymphadenopathy. No pelvic sidewall lymphadenopathy. Reproductive: The prostate gland and seminal vesicles are unremarkable. Other: No intraperitoneal free fluid. Musculoskeletal: No worrisome lytic or sclerotic osseous abnormality. IMPRESSION: 1. Pancreas is diffusely ill-defined through the tail region with subtle peripancreatic edema/inflammation. Imaging features compatible with acute pancreatitis. Pancreatic protocol CT recommended if renal function permits. If not, MRI of the abdomen with and without contrast recommended as pancreatic mass lesion cannot be excluded. 2. Hepatic cirrhosis with hepatic steatosis. 3. Cholelithiasis. 4. Left colonic diverticulosis without diverticulitis. 5. Atrophic left kidney. Electronically Signed   By: Kennith Center M.D.   On: 07/05/2023 07:48      Assessment & Plan:  [name] [name] is a 58 y.o. male who was admitted with acute pancreatitis, decompensated cirrhosis, and AKI on top of CKD.  Nephrology requesting temporary dialysis catheter for HD.  -Given the patient's significantly elevated INR 5.5 today from 2.4, I discussed with the patient the safest location for me to place a temporary dialysis catheter would be in his groin.  The patient has a large comedone in the right groin where I would plan to access the right femoral vein, so decision was made to go on the left. -The risk and benefits of left femoral temporary dialysis catheter insertion were discussed including but not limited to bleeding, infection, and need for additional procedures.  After careful consideration, [name] [name] has decided to proceed with bedside procedure.  -We did discuss that he is at higher risk of bleeding secondary to his elevated INR. -Please refer to separate procedure note for details -No need for x-ray confirmation -Okay to use line for dialysis.  Arterial and venous limbs of the dialysis catheter are locked with 1.4 cc of 1000 units/mL of heparin.  There is a pigtail catheter that is also able to be used by nursing staff as an IV source -HD per nephrology -Remainder of care per primary team  All questions were answered to the satisfaction of the patient.  Note: Portions of this report may have been transcribed using voice recognition software. Every effort has been made to ensure accuracy; however, inadvertent computerized transcription errors may still be present.   -- Theophilus Kinds, DO [facility] [address] [place], [place] [address] [phone] (office)

## 2023-07-06 NOTE — Procedures (Addendum)
 Procedure Note  07/06/23   Preoperative Diagnosis: Need for acute hemodialysis, AKI   Postoperative Diagnosis: Same   Procedure(s) Performed: Left femoral temporary hemodialysis catheter insertion with ultrasound guidance   Surgeon: Theophilus Kinds, DO   Assistants: None   Anesthesia: 1% lidocaine    Complications: None    Indications: [name] [name] is a 58 y.o. who has an AKI and requires hemodialysis . I discussed the risk and benefits of placement of the central line with the patient, including but not limited to bleeding, infection, and need for other procedures. The patient has given verbal and written consent for the procedure.    Procedure: The patient placed supine. The left groin was prepped and draped in the usual sterile fashion.  Wearing full gown and gloves, I performed the procedure.  One percent lidocaine was used for local anesthesia. An ultrasound was utilized to localize above the left femoral vein.  An ultrasound was again used to assess the left femoral vein.  The needle with syringe was advanced into the femoral vein with dark venous return, and a wire was placed using the Seldinger technique without difficulty.  The skin was knicked and a dilator was placed, and the temporary dialysis catheter was placed over the wire with continued control of the wire.  There was good draw back of blood from all three lumens and each flushed easily with saline.  The catheter was secured in 2 points with 2-0 silk and a biopatch and dressing was placed. The arterial and venous lines were locked with 1.4 cc of 1000u/mL of heparin.    The patient tolerated the procedure well.  Line does not need xray to confirm position.  It is good to use.   Theophilus Kinds, DO [facility] [address] [place], [place] [address] [phone] (office)

## 2023-07-06 NOTE — ED Notes (Signed)
 Pt became SOB and extremely anxious. Nurse applied nasal cannula at 2 lpm and talked with pt with readjustment in the bed. Nurse paused acetadote to make sure it was not affecting pt. Pt relaxed and medication started again. As nurse staying to monitor pt requested nasal cannula off.

## 2023-07-06 NOTE — Progress Notes (Addendum)
 eLink Physician-Brief Progress Note Patient Name: [name] [name] DOB: December 11, 1965 MRN: [id number]   Date of Service  07/06/2023  HPI/Events of Note  58/M with history of alcoholic liver cirrhosis, COPD, who presents to an outside ED with abdominal pain. Workup was consistent with acute pancreatitis. Labs were also significant for AKI in CKD with associated hyperkalemia and acidosis. A L femoral dialysis catheter was placed and emergent HD was carried out. He was then transferred to this facility for ICU level of care.   eICU Interventions  Acute pancreatitis - Initial lipase 2,929.  - CT abdomen and pelvis with cholelithiasis without acute cholecystitis or biliary dilation.   - Also with significant transaminitis (AST 5,021 and ALT 1,709), but alk phos was not elevated.  - ?ETOH - will add on ETOH level to earlier labs.  - Added on TG level to prior labs as well.  - Will follow other workup done prior to transfer - GI evaluation  AKI on CKD - Underwent emergent HD in ED for acidosis and hyperkalemia - Will continue to monitor serial labs, follow I/Os - Maintain on continuous cardiac monitoring - Will follow further nephrology plans/recommendations.         Javelle Donigan M DELA CRUZ 07/06/2023, 8:32 PM  4:04 AM Pt in sinus tachycardia to 140s,  RR up to 30-40, SpO2 down to low 90s on 4LNC (had been on room air on admission) Afebrile. Still without UO Pt agitated and restless.  Denies pain. With mild tremor  AM Labs drawn, results pending Will give one time dose of ativan 2mg  IV - will monitor response.  Will check CXR to see if pt developing pulmonary edema. Will pause fluids for now.    6:29 AM Following ativan earlier, pt suddenly decompensated, becoming obtunded and hypotensive.  Ground team had assessed pt, concern raised for bleed as abdomen was increasingly distended, coags elevated, and he did have a femoral line placed.  However, CBC shows stable Hgb, CT abdomen+pelvis did not  show retroperitoneal bleed, again demonstrated pancreatitis.  WBC still elevated - pt being started on empiric antibiotics for possible sepsis.  Check lactate,  CT head negative for acute process.  Will check ammonia as well.  Likely metabolic, related to pancreatitis and possible developing sepsis.  Will continue to monitor mental status, ability to protect airway.   Of note, there was a mediastial mass that was noted on the CXR and CT chest. Would need additional imaging if had not been worked up previously.

## 2023-07-06 NOTE — ED Notes (Signed)
 MS attempted to get report. Nurse explained potassium was high and nurse attempting to fix it .

## 2023-07-06 NOTE — TOC Initial Note (Signed)
 Transition of Care [facility]) - Initial/Assessment Note    Patient Details  Name: [name] [name] MRN: [id number] Date of Birth: 1965-12-30  [facility] CM/SW Contact:    Leitha Bleak, RN Phone Number: 07/06/2023, 2:24 PM  Clinical Narrative:        Patient admitted from home with acute pancreatitis. Considered to be a high risk for readmission.Patient is very ill, possibly transferring to [facility].  Pt lives with his girlfriend. He is independent with ADLs. Pt has transportation to appointments.               Barriers to Discharge: Continued Medical Work up   Patient Goals and CMS Choice      Living arrangements for the past 2 months: Single Family Home           Prior Living Arrangements/Services Living arrangements for the past 2 months: Single Family Home Lives with:: Significant Other     Activities of Daily Living   ADL Screening (condition at time of admission) Independently performs ADLs?: Yes (appropriate for developmental age) Is the patient deaf or have difficulty hearing?: No Does the patient have difficulty seeing, even when wearing glasses/contacts?: No Does the patient have difficulty concentrating, remembering, or making decisions?: No  Permission Sought/Granted                  Emotional Assessment              Admission diagnosis:  Acute pancreatitis [K85.90] Patient Active Problem List   Diagnosis Date Noted   Dog bite 03/12/2023   Acute pancreatitis 03/11/2023   Metabolic acidosis 09/25/2022   Bladder outlet obstruction 09/25/2022   Acute pancreatitis without infection or necrosis 05/24/2022   Alcohol-induced acute pancreatitis 05/23/2022   Cholelithiasis 05/20/2022   Tobacco use disorder 05/20/2022   GERD (gastroesophageal reflux disease) 05/20/2022   B12 deficiency 03/15/2022   History of colonic polyps 03/09/2022   Gallbladder polyp 03/01/2022   Alcohol abuse 11/28/2021   Upper abdominal pain 08/11/2021   AKI (acute kidney  injury) (HCC) 08/11/2021   Portal hypertension (HCC) 08/03/2021   Other ascites 08/03/2021   Moderate protein-calorie malnutrition (HCC) 08/03/2021   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   Elevated ferritin 08/26/2020   Hypomagnesemia 08/11/2020   Hypoalbuminemia 08/11/2020   Hypotension 08/11/2020   Anemia of chronic disease 08/11/2020   DVT (deep venous thrombosis) -Lt LL Extensive DVT 08/05/2020   Alcoholic cirrhosis of liver with ascites (HCC) 08/05/2020   COPD (chronic obstructive pulmonary disease) (HCC) 08/05/2020   Hypokalemia 08/05/2020   Cirrhosis of liver without ascites (HCC) 08/04/2020   Edema of left lower extremity 08/04/2020   Loss of weight 07/07/2020   Loss of appetite 07/07/2020   Anasarca 07/07/2020   Heme + stool 01/22/2017   Abnormal LFTs 01/22/2017   Hyperlipidemia 02/08/2015   Cigarette nicotine dependence with nicotine-induced disorder 02/08/2015   Leukocytosis 01/07/2013   Hyponatremia 01/07/2013   UTI (urinary tract infection) 01/06/2013   Kidney stone 01/06/2013   Ureteral colic 01/06/2013   PCP:  Benetta Spar, MD Pharmacy:   [facility] [phone] - [place], [place] - [address] [place] [address] Phone: [phone] Fax: [phone]     Social Drivers of Health (SDOH) Social History: SDOH Screenings   Food Insecurity: No Food Insecurity (07/06/2023)  Housing: Low Risk  (07/06/2023)  Transportation Needs: No Transportation Needs (07/06/2023)  Utilities: Not At Risk (07/06/2023)  Alcohol Screen: Low Risk  (09/13/2020)  Financial Resource Strain: High Risk (11/28/2021)   Received from [facility]  Physical Activity: Sufficiently Active (09/13/2020)  Social Connections: Moderately Isolated (09/13/2020)  Stress: No Stress Concern Present (09/13/2020)  Tobacco Use: High Risk (07/05/2023)   SDOH Interventions:     Readmission Risk  Interventions    03/13/2023    2:50 PM 05/22/2022    9:08 AM  Readmission Risk Prevention Plan  Transportation Screening Complete Complete  HRI or Home Care Consult Complete Complete  Social Work Consult for Recovery Care Planning/Counseling Complete Complete  Palliative Care Screening Not Applicable Not Applicable  Medication Review Oceanographer) Complete Complete

## 2023-07-06 NOTE — Progress Notes (Signed)
 PROGRESS NOTE    [name] [name]  ZOX:[id number] DOB: 1965/07/31 DOA: 07/05/2023 PCP: Benetta Spar, MD   Brief Narrative:    [name] [name] is a 58 y.o. male with medical history significant for alcoholic liver cirrhosis, hemosiderosis, CKD 3B, dyslipidemia, prior left lower extremity DVT, chronic anemia, COPD, and hypertension who presented to the emergency department with abdominal pain that began about 3-4 days ago.  He has also had some associated nausea and vomiting.  He has been admitted for evaluation and treatment of acute pancreatitis with transaminitis and was also noted to have AKI on CKD stage IIIb which has now been worsening with worsening acidosis as well as hyperkalemia.  Nephrology consulted with plans for dialysis catheter placement today and plans to initiate hemodialysis.  GI performing further workup and evaluation which is pending.  Assessment & Plan:   Principal Problem:   Acute pancreatitis  Assessment and Plan:   Acute pancreatitis with transaminitis-worsening transaminitis -Has history of hemosiderosis and follows hematology -No alcohol use noted since 2/8 according to patient -Continue aggressive IV fluid with 2 L bolus given in ED -Keep n.p.o. except for sips with medications and ice chips -Appreciate GI recommendations with plans for MRCP and start NAC for possible drug-induced lung injury -Diagnostic paracentesis -Check other viral etiologies -Continue to follow lab work and if no improvement in LFTs may need to consult tertiary center   AKI on CKD stage IIIb with hyperkalemia and acidosis-worsening -With suspected oliguria -Baseline creatinine near 2.0 -Give repeat dose of Lokelma -Appreciate nephrology recommendations with plans for dialysis initiation -Monitor strict I's and O's -Avoid nephrotoxic agents -Follow labs   COPD -No acute bronchospasms currently noted, continue to monitor   Folate/B12/B6 deficiencies -Continue supplementation  and follow-up with hematology   Prior history of left lower extremity DVT -Hold aspirin -Has completed prior course of anticoagulation   Dyslipidemia -Hold statin given transaminitis   Hypertension -Hold home blood pressure agents given soft blood pressure readings   DVT prophylaxis: Heparin Code Status: Full Family Communication: None at bedside Disposition Plan:  Status is: Inpatient Remains inpatient appropriate because: Need for IV medications and aggressive management.   Consultants:  GI Nephrology  Procedures:  None  Antimicrobials:  None  Subjective: Patient seen and evaluated today with no new complaints or concerns no acute overnight events.  Unfortunately, he is having worsening hyperkalemia as well as kidney injury and apparently little to no urine output.  His LFTs are worsening as well.  He is demanding to have something to drink.  Objective: Vitals:   07/06/23 0600 07/06/23 0700 07/06/23 0800 07/06/23 0900  BP: (!) 101/53 119/63 138/74 134/76  Pulse: 90 99 97 90  Resp: (!) 27 (!) 26 (!) 24 (!) 26  Temp:      TempSrc:      SpO2: 100% 100% 100% 99%  Weight:      Height:        Intake/Output Summary (Last 24 hours) at 07/06/2023 1154 Last data filed at 07/06/2023 0619 Gross per 24 hour  Intake 792.46 ml  Output --  Net 792.46 ml   Filed Weights   07/05/23 0551  Weight: 73.9 kg    Examination:  General exam: Appears calm and comfortable  Respiratory system: Clear to auscultation. Respiratory effort normal. Cardiovascular system: S1 & S2 heard, RRR.  Gastrointestinal system: Abdomen is soft Central nervous system: Alert and awake Extremities: No edema Skin: No significant lesions noted Psychiatry: Flat affect.  Data Reviewed: I have personally reviewed following labs and imaging studies  CBC: Recent Labs  Lab 07/05/23 0615 07/06/23 0440  WBC 8.0 15.3*  HGB 14.1 11.0*  HCT 44.1 36.7*  MCV 91.5 96.6  PLT 225 164   Basic  Metabolic Panel: Recent Labs  Lab 07/05/23 0615 07/06/23 0440  NA 129* 133*  K 5.4* 6.7*  CL 97* 103  CO2 21* 14*  GLUCOSE 106* 21*  BUN 36* 38*  CREATININE 3.63* 4.39*  CALCIUM 8.4* 7.1*  MG  --  1.6*   GFR: Estimated Creatinine Clearance: 19.2 mL/min (A) (by C-G formula based on SCr of 4.39 mg/dL (H)). Liver Function Tests: Recent Labs  Lab 07/05/23 0615 07/06/23 0440  AST 2,211* 5,021*  ALT 728* 1,709*  ALKPHOS 103 85  BILITOT 3.0* 3.7*  PROT 8.8* 7.1  ALBUMIN 2.8* 2.4*   Recent Labs  Lab 07/05/23 0615 07/06/23 0440  LIPASE 843* 2,929*   No results for input(s): "AMMONIA" in the last 168 hours. Coagulation Profile: Recent Labs  Lab 07/05/23 0750 07/06/23 0440  INR 2.4* 5.5*   Cardiac Enzymes: No results for input(s): "CKTOTAL", "CKMB", "CKMBINDEX", "TROPONINI" in the last 168 hours. BNP (last 3 results) No results for input(s): "PROBNP" in the last 8760 hours. HbA1C: No results for input(s): "HGBA1C" in the last 72 hours. CBG: Recent Labs  Lab 07/06/23 0646 07/06/23 0716 07/06/23 0921  GLUCAP <10* 120* 84   Lipid Profile: No results for input(s): "CHOL", "HDL", "LDLCALC", "TRIG", "CHOLHDL", "LDLDIRECT" in the last 72 hours. Thyroid Function Tests: No results for input(s): "TSH", "T4TOTAL", "FREET4", "T3FREE", "THYROIDAB" in the last 72 hours. Anemia Panel: No results for input(s): "VITAMINB12", "FOLATE", "FERRITIN", "TIBC", "IRON", "RETICCTPCT" in the last 72 hours. Sepsis Labs: No results for input(s): "PROCALCITON", "LATICACIDVEN" in the last 168 hours.  No results found for this or any previous visit (from the past 240 hours).       Radiology Studies: US ABDOMEN LIMITED WITH LIVER DOPPLER Result Date: 07/05/2023 CLINICAL DATA:  Abdominal pain.  Known cirrhosis. EXAM: DUPLEX ULTRASOUND OF LIVER TECHNIQUE: Color and duplex Doppler ultrasound was performed to evaluate the hepatic in-flow and out-flow vessels. COMPARISON:  CT 07/05/2023  earlier.  Noncontrast.  MRI 04/12/2023 FINDINGS: Liver: Nodular appearance of the liver. Please correlate with known history. Gallbladder is distended with some wall thickening and edema, nonspecific in the presence of cirrhosis. Common duct measures 5 mm. Main Portal Vein size: 9 mm cm Portal Vein Velocities Main Prox:  35.7 cm/sec Main Mid: 29 cm/sec Main Dist:  31.2 cm/sec Right: 31.9 cm/sec, posterior branch of the right portal vein Left: 19.9 cm/sec Hepatic Vein Velocities Right:  54 cm/sec Middle:  27.5 cm/sec Left:  30 cm/sec IVC: Present and patent with normal respiratory phasicity. Hepatic Artery Velocity:  220.5 cm/sec Portal Vein Occlusion/Thrombus: No Ascites: Trace Varices: None IMPRESSION: Preserved hepatic vasculature. Appropriate direction flow. Relative increased flow along the a [place] artery. Electronically Signed   By: Karen Kays M.D.   On: 07/05/2023 11:54   CT ABDOMEN PELVIS WO CONTRAST Result Date: 07/05/2023 CLINICAL DATA:  Lower abdominal pain.  Nausea vomiting. EXAM: CT ABDOMEN AND PELVIS WITHOUT CONTRAST TECHNIQUE: Multidetector CT imaging of the abdomen and pelvis was performed following the standard protocol without IV contrast. RADIATION DOSE REDUCTION: This exam was performed according to the departmental dose-optimization program which includes automated exposure control, adjustment of the mA and/or kV according to patient size and/or use of iterative reconstruction technique. COMPARISON:  03/11/2023 FINDINGS:  Lower chest: No acute findings. Hepatobiliary: No suspicious focal abnormality in the liver on this study without intravenous contrast. The liver shows diffusely decreased attenuation suggesting fat deposition. Nodular liver contour is compatible with cirrhosis. Layering tiny calcified gallstones evident. No intrahepatic or extrahepatic biliary dilation. Pancreas: Pancreas is diffusely ill-defined in the region of the head and the tail with subtle peripancreatic  edema/inflammation. No main duct dilatation. Spleen: No splenomegaly. No suspicious focal mass lesion. Adrenals/Urinary Tract: No adrenal nodule or mass. Right kidney unremarkable. Left kidney atrophic. No evidence for hydroureter. The urinary bladder appears normal for the degree of distention. Stomach/Bowel: Stomach is nondistended, accentuating wall thickness. Duodenum is normally positioned as is the ligament of Treitz. No small bowel wall thickening. No small bowel dilatation. The terminal ileum is normal. The appendix is normal. No gross colonic mass. No colonic wall thickening. Diverticular changes are noted in the left colon without evidence of diverticulitis. Vascular/Lymphatic: There is moderate atherosclerotic calcification of the abdominal aorta without aneurysm. There is no gastrohepatic or hepatoduodenal ligament lymphadenopathy. No retroperitoneal or mesenteric lymphadenopathy. No pelvic sidewall lymphadenopathy. Reproductive: The prostate gland and seminal vesicles are unremarkable. Other: No intraperitoneal free fluid. Musculoskeletal: No worrisome lytic or sclerotic osseous abnormality. IMPRESSION: 1. Pancreas is diffusely ill-defined through the tail region with subtle peripancreatic edema/inflammation. Imaging features compatible with acute pancreatitis. Pancreatic protocol CT recommended if renal function permits. If not, MRI of the abdomen with and without contrast recommended as pancreatic mass lesion cannot be excluded. 2. Hepatic cirrhosis with hepatic steatosis. 3. Cholelithiasis. 4. Left colonic diverticulosis without diverticulitis. 5. Atrophic left kidney. Electronically Signed   By: Kennith Center M.D.   On: 07/05/2023 07:48        Scheduled Meds:  acetylcysteine  150 mg/kg Intravenous Once   aspirin EC  81 mg Oral Daily   Chlorhexidine Gluconate Cloth  6 each Topical Q0600   heparin  5,000 Units Subcutaneous Q8H   ipratropium-albuterol  3 mL Nebulization Q6H    mometasone-formoterol  2 puff Inhalation BID   sodium bicarbonate  650 mg Oral TID   tamsulosin  0.4 mg Oral QPC supper   Continuous Infusions:  acetylcysteine 12.5 mg/kg/hr (07/06/23 1152)   Followed by   acetylcysteine     albumin human     dextrose 5 % and 0.9 % NaCl 125 mL/hr at 07/06/23 0804     LOS: 1 day    Total care time spent: 55 minutes    Luana Tatro D Sherryll Burger, DO [facility]  If 7PM-7AM, please contact night-coverage [email or web address] 07/06/2023, 11:54 AM

## 2023-07-07 ENCOUNTER — Inpatient Hospital Stay (HOSPITAL_COMMUNITY)

## 2023-07-07 ENCOUNTER — Other Ambulatory Visit: Payer: Self-pay

## 2023-07-07 DIAGNOSIS — E162 Hypoglycemia, unspecified: Secondary | ICD-10-CM

## 2023-07-07 DIAGNOSIS — R400 Somnolence: Secondary | ICD-10-CM

## 2023-07-07 DIAGNOSIS — D689 Coagulation defect, unspecified: Secondary | ICD-10-CM

## 2023-07-07 DIAGNOSIS — A419 Sepsis, unspecified organism: Secondary | ICD-10-CM | POA: Diagnosis not present

## 2023-07-07 DIAGNOSIS — K7030 Alcoholic cirrhosis of liver without ascites: Secondary | ICD-10-CM | POA: Diagnosis not present

## 2023-07-07 DIAGNOSIS — R6521 Severe sepsis with septic shock: Secondary | ICD-10-CM

## 2023-07-07 DIAGNOSIS — K852 Alcohol induced acute pancreatitis without necrosis or infection: Secondary | ICD-10-CM | POA: Diagnosis not present

## 2023-07-07 DIAGNOSIS — J449 Chronic obstructive pulmonary disease, unspecified: Secondary | ICD-10-CM

## 2023-07-07 DIAGNOSIS — K859 Acute pancreatitis without necrosis or infection, unspecified: Secondary | ICD-10-CM | POA: Diagnosis not present

## 2023-07-07 DIAGNOSIS — R748 Abnormal levels of other serum enzymes: Secondary | ICD-10-CM | POA: Diagnosis not present

## 2023-07-07 LAB — COMPREHENSIVE METABOLIC PANEL
ALT: 1636 U/L — ABNORMAL HIGH (ref 0–44)
AST: 3980 U/L — ABNORMAL HIGH (ref 15–41)
Albumin: 2.3 g/dL — ABNORMAL LOW (ref 3.5–5.0)
Alkaline Phosphatase: 71 U/L (ref 38–126)
Anion gap: 18 — ABNORMAL HIGH (ref 5–15)
BUN: 24 mg/dL — ABNORMAL HIGH (ref 6–20)
CO2: 17 mmol/L — ABNORMAL LOW (ref 22–32)
Calcium: 6.3 mg/dL — CL (ref 8.9–10.3)
Chloride: 100 mmol/L (ref 98–111)
Creatinine, Ser: 4.36 mg/dL — ABNORMAL HIGH (ref 0.61–1.24)
GFR, Estimated: 15 mL/min — ABNORMAL LOW (ref >60)
Glucose, Bld: 114 mg/dL — ABNORMAL HIGH (ref 70–99)
Potassium: 4.1 mmol/L (ref 3.5–5.1)
Sodium: 135 mmol/L (ref 135–145)
Total Bilirubin: 5.4 mg/dL — ABNORMAL HIGH (ref 0.0–1.2)
Total Protein: 5.8 g/dL — ABNORMAL LOW (ref 6.5–8.1)

## 2023-07-07 LAB — BASIC METABOLIC PANEL
Anion gap: 19 — ABNORMAL HIGH (ref 5–15)
Anion gap: 29 — ABNORMAL HIGH (ref 5–15)
BUN: 22 mg/dL — ABNORMAL HIGH (ref 6–20)
BUN: 23 mg/dL — ABNORMAL HIGH (ref 6–20)
CO2: 16 mmol/L — ABNORMAL LOW (ref 22–32)
CO2: 14 mmol/L — ABNORMAL LOW (ref 22–32)
Calcium: 6.8 mg/dL — ABNORMAL LOW (ref 8.9–10.3)
Calcium: 5.7 mg/dL — CL (ref 8.9–10.3)
Chloride: 100 mmol/L (ref 98–111)
Chloride: 95 mmol/L — ABNORMAL LOW (ref 98–111)
Creatinine, Ser: 3.87 mg/dL — ABNORMAL HIGH (ref 0.61–1.24)
Creatinine, Ser: 3.85 mg/dL — ABNORMAL HIGH (ref 0.61–1.24)
GFR, Estimated: 17 mL/min — ABNORMAL LOW (ref >60)
GFR, Estimated: 17 mL/min — ABNORMAL LOW (ref >60)
Glucose, Bld: 83 mg/dL (ref 70–99)
Glucose, Bld: 459 mg/dL — ABNORMAL HIGH (ref 70–99)
Potassium: 4.4 mmol/L (ref 3.5–5.1)
Potassium: 4.1 mmol/L (ref 3.5–5.1)
Sodium: 135 mmol/L (ref 135–145)
Sodium: 138 mmol/L (ref 135–145)

## 2023-07-07 LAB — GLUCOSE, CAPILLARY
Glucose-Capillary: 177 mg/dL — ABNORMAL HIGH (ref 70–99)
Glucose-Capillary: 57 mg/dL — ABNORMAL LOW (ref 70–99)
Glucose-Capillary: 115 mg/dL — ABNORMAL HIGH (ref 70–99)
Glucose-Capillary: 65 mg/dL — ABNORMAL LOW (ref 70–99)
Glucose-Capillary: 126 mg/dL — ABNORMAL HIGH (ref 70–99)
Glucose-Capillary: 41 mg/dL — CL (ref 70–99)
Glucose-Capillary: 60 mg/dL — ABNORMAL LOW (ref 70–99)
Glucose-Capillary: 61 mg/dL — ABNORMAL LOW (ref 70–99)
Glucose-Capillary: 76 mg/dL (ref 70–99)
Glucose-Capillary: 86 mg/dL (ref 70–99)
Glucose-Capillary: 78 mg/dL (ref 70–99)

## 2023-07-07 LAB — CBC
HCT: 30 % — ABNORMAL LOW (ref 39.0–52.0)
Hemoglobin: 9.7 g/dL — ABNORMAL LOW (ref 13.0–17.0)
MCH: 29.7 pg (ref 26.0–34.0)
MCHC: 32.3 g/dL (ref 30.0–36.0)
MCV: 91.7 fL (ref 80.0–100.0)
Platelets: 124 10*3/uL — ABNORMAL LOW (ref 150–400)
RBC: 3.27 MIL/uL — ABNORMAL LOW (ref 4.22–5.81)
RDW: 15.9 % — ABNORMAL HIGH (ref 11.5–15.5)
WBC: 15.9 10*3/uL — ABNORMAL HIGH (ref 4.0–10.5)
nRBC: 0.1 % (ref 0.0–0.2)

## 2023-07-07 LAB — BLOOD GAS, VENOUS
Acid-base deficit: 12.3 mmol/L — ABNORMAL HIGH (ref 0.0–2.0)
Bicarbonate: 14.2 mmol/L — ABNORMAL LOW (ref 20.0–28.0)
O2 Saturation: 67.1 %
Patient temperature: 36.7
pCO2, Ven: 34 mmHg — ABNORMAL LOW (ref 44–60)
pH, Ven: 7.23 — ABNORMAL LOW (ref 7.25–7.43)
pO2, Ven: 48 mmHg — ABNORMAL HIGH (ref 32–45)

## 2023-07-07 LAB — POCT I-STAT 7, (LYTES, BLD GAS, ICA,H+H)
Acid-base deficit: 9 mmol/L — ABNORMAL HIGH (ref 0.0–2.0)
Bicarbonate: 15.9 mmol/L — ABNORMAL LOW (ref 20.0–28.0)
Calcium, Ion: 0.84 mmol/L — CL (ref 1.15–1.40)
HCT: 33 % — ABNORMAL LOW (ref 39.0–52.0)
Hemoglobin: 11.2 g/dL — ABNORMAL LOW (ref 13.0–17.0)
O2 Saturation: 94 %
Patient temperature: 97.9
Potassium: 4.5 mmol/L (ref 3.5–5.1)
Sodium: 136 mmol/L (ref 135–145)
TCO2: 17 mmol/L — ABNORMAL LOW (ref 22–32)
pCO2 arterial: 31.6 mmHg — ABNORMAL LOW (ref 32–48)
pH, Arterial: 7.307 — ABNORMAL LOW (ref 7.35–7.45)
pO2, Arterial: 75 mmHg — ABNORMAL LOW (ref 83–108)

## 2023-07-07 LAB — ABO/RH: ABO/RH(D): O POS

## 2023-07-07 LAB — LIPASE, BLOOD: Lipase: 1654 U/L — ABNORMAL HIGH (ref 11–51)

## 2023-07-07 LAB — TYPE AND SCREEN
ABO/RH(D): O POS
Antibody Screen: NEGATIVE

## 2023-07-07 LAB — PROTIME-INR
INR: 9.9 (ref 0.8–1.2)
INR: >10 (ref 0.8–1.2)
Prothrombin Time: 79.2 s — ABNORMAL HIGH (ref 11.4–15.2)
Prothrombin Time: >90 s — ABNORMAL HIGH (ref 11.4–15.2)

## 2023-07-07 LAB — AMMONIA: Ammonia: 60 umol/L — ABNORMAL HIGH (ref 9–35)

## 2023-07-07 LAB — LACTIC ACID, PLASMA
Lactic Acid, Venous: >9 mmol/L (ref 0.5–1.9)
Lactic Acid, Venous: >9 mmol/L (ref 0.5–1.9)

## 2023-07-07 LAB — HEPATITIS B SURFACE ANTIBODY, QUANTITATIVE: Hep B S AB Quant (Post): <3.5 m[IU]/mL — ABNORMAL LOW

## 2023-07-07 LAB — APTT: aPTT: 100 s — ABNORMAL HIGH (ref 24–36)

## 2023-07-07 MED ORDER — DEXTROSE 5 % IV SOLN
10.0000 mg | Freq: Once | INTRAVENOUS | Status: AC
  Administered 2023-07-07: 10 mg via INTRAVENOUS
  Filled 2023-07-07: qty 1

## 2023-07-07 MED ORDER — LIDOCAINE HCL (PF) 1 % IJ SOLN
INTRAMUSCULAR | Status: AC
  Filled 2023-07-07: qty 5

## 2023-07-07 MED ORDER — MIDODRINE HCL 5 MG PO TABS: 15.0000 mg | ORAL_TABLET | Freq: Three times a day (TID) | ORAL | Status: DC

## 2023-07-07 MED ORDER — LORAZEPAM 2 MG/ML IJ SOLN
2.0000 mg | Freq: Once | INTRAMUSCULAR | Status: AC
  Administered 2023-07-07: 2 mg via INTRAVENOUS
  Filled 2023-07-07: qty 1

## 2023-07-07 MED ORDER — HEPARIN SODIUM (PORCINE) 1000 UNIT/ML IJ SOLN
2800.0000 [IU] | Freq: Once | INTRAMUSCULAR | Status: AC
  Administered 2023-07-07: 2800 [IU]
  Filled 2023-07-07: qty 3

## 2023-07-07 MED ORDER — DEXTROSE 50 % IV SOLN
INTRAVENOUS | Status: AC
Start: 2023-07-07 — End: 2023-07-07
  Administered 2023-07-07: 25 mL
  Filled 2023-07-07: qty 50

## 2023-07-07 MED ORDER — CALCIUM GLUCONATE-NACL 1-0.675 GM/50ML-% IV SOLN
1.0000 g | Freq: Once | INTRAVENOUS | Status: AC
  Administered 2023-07-07: 1000 mg via INTRAVENOUS
  Filled 2023-07-07: qty 50

## 2023-07-07 MED ORDER — VASOPRESSIN 20 UNITS/100 ML INFUSION FOR SHOCK
0.0000 [IU]/min | INTRAVENOUS | Status: DC
  Administered 2023-07-07 – 2023-07-08 (×4): 0.03 [IU]/min via INTRAVENOUS
  Administered 2023-07-09: 0.04 [IU]/min via INTRAVENOUS
  Administered 2023-07-09: 0.03 [IU]/min via INTRAVENOUS
  Administered 2023-07-10: 0.04 [IU]/min via INTRAVENOUS
  Filled 2023-07-07 (×7): qty 100

## 2023-07-07 MED ORDER — SODIUM BICARBONATE 8.4 % IV SOLN
INTRAVENOUS | Status: AC
  Administered 2023-07-07: 50 meq via INTRAVENOUS
  Filled 2023-07-07: qty 50

## 2023-07-07 MED ORDER — LACTULOSE ENEMA
300.0000 mL | Freq: Three times a day (TID) | ORAL | Status: DC
  Administered 2023-07-08 (×2): 300 mL via RECTAL
  Filled 2023-07-07 (×5): qty 300

## 2023-07-07 MED ORDER — FOLIC ACID 5 MG/ML IJ SOLN
1.0000 mg | Freq: Every day | INTRAMUSCULAR | Status: DC
  Administered 2023-07-07 – 2023-07-09 (×3): 1 mg via INTRAVENOUS
  Filled 2023-07-07 (×4): qty 0.2

## 2023-07-07 MED ORDER — LACTATED RINGERS IV BOLUS
1000.0000 mL | Freq: Once | INTRAVENOUS | Status: AC
  Administered 2023-07-07: 1000 mL via INTRAVENOUS

## 2023-07-07 MED ORDER — HYDROMORPHONE HCL 1 MG/ML IJ SOLN
0.5000 mg | INTRAMUSCULAR | Status: DC | PRN
  Administered 2023-07-08 (×3): 0.5 mg via INTRAVENOUS
  Filled 2023-07-07 (×3): qty 0.5

## 2023-07-07 MED ORDER — NOREPINEPHRINE 4 MG/250ML-% IV SOLN
0.0000 ug/min | INTRAVENOUS | Status: DC
Start: 2023-07-07 — End: 2023-07-07
  Administered 2023-07-07: 16 ug/min via INTRAVENOUS
  Filled 2023-07-07: qty 250

## 2023-07-07 MED ORDER — CALCIUM GLUCONATE-NACL 2-0.675 GM/100ML-% IV SOLN
2.0000 g | Freq: Once | INTRAVENOUS | Status: AC
  Administered 2023-07-07: 2000 mg via INTRAVENOUS
  Filled 2023-07-07: qty 100

## 2023-07-07 MED ORDER — SODIUM CHLORIDE 0.9 % IV SOLN
1.0000 g | Freq: Two times a day (BID) | INTRAVENOUS | Status: DC
  Administered 2023-07-07: 1 g via INTRAVENOUS
  Filled 2023-07-07 (×2): qty 20

## 2023-07-07 MED ORDER — SODIUM BICARBONATE 8.4 % IV SOLN
50.0000 meq | Freq: Once | INTRAVENOUS | Status: AC
  Administered 2023-07-07: 50 meq via INTRAVENOUS
  Filled 2023-07-07: qty 50

## 2023-07-07 MED ORDER — DEXTROSE 10 % IV SOLN: INTRAVENOUS | Status: DC

## 2023-07-07 MED ORDER — ALBUMIN HUMAN 25 % IV SOLN
25.0000 g | Freq: Once | INTRAVENOUS | Status: AC
  Administered 2023-07-07: 25 g via INTRAVENOUS
  Filled 2023-07-07: qty 100

## 2023-07-07 MED ORDER — PANTOPRAZOLE SODIUM 40 MG IV SOLR
40.0000 mg | INTRAVENOUS | Status: DC
  Administered 2023-07-07 – 2023-07-22 (×16): 40 mg via INTRAVENOUS
  Filled 2023-07-07 (×16): qty 10

## 2023-07-07 MED ORDER — MAGNESIUM SULFATE 2 GM/50ML IV SOLN
2.0000 g | Freq: Once | INTRAVENOUS | Status: AC
  Administered 2023-07-07: 2 g via INTRAVENOUS
  Filled 2023-07-07: qty 50

## 2023-07-07 MED ORDER — DEXTROSE 50 % IV SOLN
1.0000 | Freq: Once | INTRAVENOUS | Status: AC
  Administered 2023-07-07: 50 mL via INTRAVENOUS

## 2023-07-07 MED ORDER — SODIUM BICARBONATE 8.4 % IV SOLN: 50.0000 meq | Freq: Once | INTRAVENOUS | Status: AC

## 2023-07-07 MED ORDER — MIDODRINE HCL 5 MG PO TABS
10.0000 mg | ORAL_TABLET | Freq: Three times a day (TID) | ORAL | Status: DC
Start: 2023-07-07 — End: 2023-07-07
  Administered 2023-07-07 (×2): 10 mg via ORAL
  Filled 2023-07-07 (×2): qty 2

## 2023-07-07 MED ORDER — VITAMIN K1 10 MG/ML IJ SOLN
10.0000 mg | Freq: Once | INTRAVENOUS | Status: AC
  Administered 2023-07-07: 10 mg via INTRAVENOUS
  Filled 2023-07-07: qty 1

## 2023-07-07 MED ORDER — HYDROMORPHONE HCL 1 MG/ML IJ SOLN
0.5000 mg | INTRAMUSCULAR | Status: DC | PRN
  Administered 2023-07-07 (×2): 0.5 mg via INTRAVENOUS
  Filled 2023-07-07 (×2): qty 0.5

## 2023-07-07 MED ORDER — SODIUM CHLORIDE 0.9 % IV SOLN
1.0000 g | INTRAVENOUS | Status: DC
  Administered 2023-07-07: 1 g via INTRAVENOUS
  Filled 2023-07-07: qty 20

## 2023-07-07 MED ORDER — DEXTROSE 50 % IV SOLN
INTRAVENOUS | Status: AC
Start: 2023-07-07 — End: 2023-07-07
  Filled 2023-07-07: qty 50

## 2023-07-07 MED ORDER — NOREPINEPHRINE 16 MG/250ML-% IV SOLN
0.0000 ug/min | INTRAVENOUS | Status: DC
  Administered 2023-07-07: 30 ug/min via INTRAVENOUS
  Administered 2023-07-07: 18 ug/min via INTRAVENOUS
  Administered 2023-07-08 (×2): 40 ug/min via INTRAVENOUS
  Administered 2023-07-08: 37 ug/min via INTRAVENOUS
  Administered 2023-07-09 (×2): 40 ug/min via INTRAVENOUS
  Filled 2023-07-07 (×8): qty 250

## 2023-07-07 MED ORDER — THIAMINE HCL 100 MG/ML IJ SOLN
100.0000 mg | Freq: Every day | INTRAMUSCULAR | Status: DC
  Administered 2023-07-07 – 2023-07-09 (×3): 100 mg via INTRAVENOUS
  Filled 2023-07-07 (×3): qty 2

## 2023-07-07 MED ORDER — HEPARIN SODIUM (PORCINE) 1000 UNIT/ML IJ SOLN
INTRAMUSCULAR | Status: AC
Start: 2023-07-07 — End: 2023-07-07
  Filled 2023-07-07: qty 1

## 2023-07-07 MED ORDER — OXYCODONE HCL 5 MG PO TABS: 2.5000 mg | ORAL_TABLET | ORAL | Status: DC | PRN

## 2023-07-07 MED ORDER — NOREPINEPHRINE 4 MG/250ML-% IV SOLN
INTRAVENOUS | Status: AC
  Administered 2023-07-07: 2 ug/min via INTRAVENOUS
  Filled 2023-07-07: qty 250

## 2023-07-07 MED ORDER — LACTULOSE 10 GM/15ML PO SOLN
20.0000 g | Freq: Three times a day (TID) | ORAL | Status: DC
  Administered 2023-07-07 (×2): 20 g via ORAL
  Filled 2023-07-07 (×2): qty 30

## 2023-07-07 NOTE — Progress Notes (Signed)
 Left formal 20cm  trialysis cath noted on CT to terminate in small lumbar vein rather than left common iliac vein, suggested retraction by 3cm to maintain.  - under sterile procedure, catheter retracted by 3cm, sutured, biopatch and sterile dressing placed.  Ports continue to return and flush easily. No bleeding complications       Posey Boyer, MSN, AG-ACNP-BC [facility] 07/07/2023, 9:13 AM  See Amion for pager If no response to pager , please call [phone] until 7pm After 7:00 pm call Elink  [phone]

## 2023-07-07 NOTE — Progress Notes (Signed)
 Admit: 07/05/2023 LOS: 2  38M with acute gallstone pancreatitis, dialysis dependent severe AKI on CKD 3B from ATN, progressive shock on pressors, alcoholic cirrhosis, hyperkalemia.  Subjective:  Dialysis yesterday evening before transfer to [facility] with no UF, K improved 4.1 Progressive pressor requirements since arrival, patient with progressive encephalopathy CT of the abdomen demonstrated that femoral temp HD catheter and small vein and needing to be retracted, discussed with CCM provider who will help  03/07 0701 - 03/08 0700 In: 3277.1 [I.V.:3189.9; IV Piggyback:87.2] Out: 0   Filed Weights   07/05/23 0551 07/06/23 2030 07/07/23 0122  Weight: 73.9 kg 75.1 kg 75.1 kg    Scheduled Meds:  Chlorhexidine Gluconate Cloth  6 each Topical Q0600   folic acid  1 mg Intravenous Daily   heparin sodium (porcine)  2,800 Units Intracatheter Once   midodrine  10 mg Oral TID WC   mometasone-formoterol  2 puff Inhalation BID   pantoprazole (PROTONIX) IV  40 mg Intravenous Q24H   sodium bicarbonate  650 mg Oral TID   tamsulosin  0.4 mg Oral QPC supper   thiamine (VITAMIN B1) injection  100 mg Intravenous Daily   Continuous Infusions:  acetylcysteine 6.2338 mg/kg/hr (07/07/23 0700)   dextrose 30 mL/hr at 07/07/23 0912   meropenem (MERREM) IV 1 g (07/07/23 0718)   norepinephrine (LEVOPHED) Adult infusion 16 mcg/min (07/07/23 0919)   PRN Meds:.alteplase, docusate sodium, HYDROmorphone (DILAUDID) injection, ipratropium-albuterol, ondansetron **OR** ondansetron (ZOFRAN) IV  Current Labs: reviewed   Physical Exam:  Blood pressure (!) 90/58, pulse (!) 108, temperature 97.7 F (36.5 C), temperature source Oral, resp. rate (!) 31, height 6' (1.829 m), weight 75.1 kg, SpO2 96%. Encephalopathic, not answering questions Tachypneic Tachycardic, regular peripheral edema Left femoral temp HD cath with clean bandage NCAT Distended, mild grimace to palpation No  rashes  A Dialysis dependent anuric AKI on CKD3b likely ischemic ATN, started HD 07/06/2023 using femoral temp HD catheter placed by [facility] Severe gallstone pancreatitis with transaminitis: Per critical care Worsening shock likely septic Anion gap metabolic acidosis/lactic acidosis Hyperkalemia resolved Alcoholic cirrhosis Hypertension, holding home medications Femoral HD catheter in small vein   P No immediate indication to initiate dialysis but if indicated/desired will be CRRT With progressive worsening, shock, encephalopathy consider goals of care conversations at this time Appreciate assistance of CCM for catheter placement Discussed with primary RN and CCM provider Medication Issues; Preferred narcotic agents for pain control are hydromorphone, fentanyl, and methadone. Morphine should not be used.  Baclofen should be avoided Avoid oral sodium phosphate and magnesium citrate based laxatives / bowel preps    Sabra Heck MD 07/07/2023, 9:27 AM  Recent Labs  Lab 07/06/23 1001 07/06/23 2253 07/07/23 0449  NA 132* 135 135  K 5.8* 4.4 4.1  CL 99 100 100  CO2 14* 16* 17*  GLUCOSE 214* 83 114*  BUN 40* 22* 24*  CREATININE 4.81* 3.87* 4.36*  CALCIUM 7.2* 6.8* 6.3*   Recent Labs  Lab 07/06/23 0440 07/06/23 2253 07/07/23 0449  WBC 15.3* 14.6* 15.9*  HGB 11.0* 9.6* 9.7*  HCT 36.7* 30.0* 30.0*  MCV 96.6 92.6 91.7  PLT 164 145* 124*

## 2023-07-07 NOTE — Consult Note (Signed)
 Consultation  Referring Provider:  PCCM Primary Care Physician:  Benetta Spar, MD Primary Gastroenterologist:  Dr. Jena Gauss Reason for Consultation:     Decompensated cirrhosis, acute pancreatitis  LOS: 2 days          HPI:   [name] [name] is a 58 y.o. male with past medical history significant for  hypertension, B12 deficiency, DVT, asthma, CKD, COPD, cirrhosis due to EtOH(Dawn Drazyk, FNP), pancreatitis due to alcohol and gallstones January 2024 (evaluated by [facility] surgery and felt not to be a candidate in light of cirrhosis as risk outweighed benefits), gallbladder polyp, chronically elevated ferritin with hemosiderosis, H63D heterozygous (followed by hematology elevated ferritin thought to be secondary to alcohol consumption and disease rather than H63D heterozygosity) presenting from [facility] for decompensated cirrhosis.  Patient initially presented to [facility] 3/6 with nausea, vomiting, diarrhea ongoing for last 3 to 4 days.  Last alcoholic beverage was June 09, 2023.  Recently admitted 03/2023 for pancreatitis.  Patient was found to have shock liver, pancreatitis, leukocytosis, and was transferred to [facility] for ICU admission  Initial workup thus far includes: - CT abdomen pelvis without contrast showing pancreas to be diffusely ill-defined through the tail region with subtle peripancreatic edema/inflammation compatible with acute pancreatitis, suggesting follow-up imaging to rule out underlying pancreatic mass. Also with cholelithiasis, atrophic left kidney, hepatic cirrhosis.  - Ultrasound abdomen with liver Doppler negative for thrombus - INR 5.5 - MELD 3.0: 34 3/7 - CMV, EBV, Hep B DNA and HEP C RNA pending - AST 5021, ALT 1709, total bilirubin 3.7 - Viral hepatitis negative - Cultures pending -Repeat CT abdomen pelvis without contrast shows posterior mediastinal mass measuring 6 cm with progressive acute pancreatitis without fluid collection.   Widespread secondary inflammation of bowel with small and large bowel wall thickening.    PREVIOUS GI WORKUP   EGD May 2023 with normal esophagus but retained food in stomach and duodenum precluded exam. EGD variceal screening due May 2025   Colonoscopy Dec 2023: pancolonic diverticulosis, multiple polyps, one which was 11 mm (tubular adenomas). Surveillance in 3 years  Past Medical History:  Diagnosis Date   Asthma    B12 deficiency 03/15/2022   Cirrhosis (HCC)    Dyspnea    High cholesterol    History of kidney stones    Hypertension    Kidney stones     Surgical History:  He  has a past surgical history that includes Fracture surgery; Appendectomy; Colonoscopy with propofol (N/A, 04/18/2017); polypectomy (04/18/2017); Esophagogastroduodenoscopy (egd) with propofol (N/A, 07/22/2020); biopsy (07/22/2020); LOWER EXTREMITY VENOGRAPHY (N/A, 08/10/2020); PERIPHERAL VASCULAR THROMBECTOMY (N/A, 08/10/2020); PERIPHERAL VASCULAR BALLOON ANGIOPLASTY (Left, 08/10/2020); IR Paracentesis (05/31/2021); Esophagogastroduodenoscopy (egd) with propofol (N/A, 09/08/2021); Colonoscopy with propofol (N/A, 04/13/2022); and polypectomy (04/13/2022). Family History:  His family history includes Cancer in his father; Diabetes in his sister. Social History:   reports that he has been smoking cigarettes. He has a 16.5 pack-year smoking history. He has been exposed to tobacco smoke. He has never used smokeless tobacco. He reports that he does not currently use alcohol. He reports that he does not use drugs.  Prior to Admission medications   Medication Sig Start Date End Date Taking? Authorizing Provider  albuterol (PROVENTIL HFA) 108 (90 Base) MCG/ACT inhaler INHALE 2 PUFFS BY MOUTH EVERY 6 HOURS AS NEEDED FOR COUGHING, WHEEZING, OR SHORTNESS OF BREATH Patient taking differently: Inhale 2 puffs into the lungs every 6 (six) hours as needed for shortness  of breath or wheezing. 02/10/21  Yes Shon Hale, MD   aspirin EC 81 MG tablet Take 81 mg by mouth daily.   Yes [provider]  atorvastatin (LIPITOR) 20 MG tablet Take 20 mg by mouth daily. 05/23/23  Yes [provider]  cyanocobalamin (VITAMIN B12) 1000 MCG/ML injection Inject 1,000 mcg into the muscle every 30 (thirty) days.   Yes [provider]  feeding supplement (ENSURE ENLIVE / ENSURE PLUS) LIQD Take 237 mLs by mouth 3 (three) times daily between meals. 03/14/23  Yes Shah, Pratik D, DO  furosemide (LASIX) 20 MG tablet TAKE 2 TABLETS BY MOUTH EVERY MORNING AND 1 TABLET IN THE AFTERNOON Patient taking differently: Take 20 mg by mouth See admin instructions. Take 40 mg in the morning and 20 mg every afternoon 01/12/23  Yes Gelene Mink, NP  metoprolol tartrate (LOPRESSOR) 25 MG tablet Take 25 mg by mouth 2 (two) times daily. 05/24/21  Yes [provider]  mometasone-formoterol (DULERA) 200-5 MCG/ACT AERO Inhale 2 puffs into the lungs 2 (two) times daily. 02/10/21  Yes Emokpae, Courage, MD  ondansetron (ZOFRAN) 4 MG tablet Take 1 tablet (4 mg total) by mouth daily as needed for nausea or vomiting. 03/14/23 03/13/24 Yes Shah, Pratik D, DO  pantoprazole (PROTONIX) 40 MG tablet TAKE 1 TABLET(40 MG) BY MOUTH DAILY Patient taking differently: Take 40 mg by mouth daily. 09/18/22  Yes Gelene Mink, NP  sodium bicarbonate 650 MG tablet Take 1 tablet (650 mg total) by mouth 3 (three) times daily. 09/25/22  Yes Vassie Loll, MD  spironolactone (ALDACTONE) 50 MG tablet TAKE 2 TABLETS BY MOUTH EVERY MORNING AND 1 TABLET DAILY IN THE AFTERNOON Patient taking differently: Take 50 mg by mouth See admin instructions. Take 100 mg in the morning and 50 mg in the afternoon 12/28/22  Yes Gelene Mink, NP  tamsulosin (FLOMAX) 0.4 MG CAPS capsule Take 1 capsule (0.4 mg total) by mouth daily after supper. 05/10/23  Yes Gelene Mink, NP    Current Facility-Administered Medications  Medication Dose Route Frequency Provider Last Rate  Last Admin   acetylcysteine (ACETADOTE) 18,000 mg in dextrose 5 % 590 mL (30.5085 mg/mL) infusion  6.25 mg/kg/hr Intravenous Continuous Brooke Bonito L, NP 15.1 mL/hr at 07/07/23 1400 6.2338 mg/kg/hr at 07/07/23 1400   alteplase (CATHFLO ACTIVASE) injection 2 mg  2 mg Intracatheter Once PRN Tyler Pita, MD       Chlorhexidine Gluconate Cloth 2 % PADS 6 each  6 each Topical Q0600 Tyler Pita, MD       dextrose 10 % infusion   Intravenous Continuous Selmer Dominion B, NP 30 mL/hr at 07/07/23 1400 Infusion Verify at 07/07/23 1400   docusate sodium (COLACE) capsule 100 mg  100 mg Oral BID PRN Arne Cleveland T, PA-C       folic acid injection 1 mg  1 mg Intravenous Daily Rozann Lesches, MD   1 mg at 07/07/23 0913   HYDROmorphone (DILAUDID) injection 0.5 mg  0.5 mg Intravenous Q2H PRN Selmer Dominion B, NP   0.5 mg at 07/07/23 0931   ipratropium-albuterol (DUONEB) 0.5-2.5 (3) MG/3ML nebulizer solution 3 mL  3 mL Nebulization Q6H PRN Agarwala, Daleen Bo, MD       meropenem (MERREM) 1 g in sodium chloride 0.9 % 100 mL IVPB  1 g Intravenous Q24H Arne Cleveland T, PA-C   Stopped at 07/07/23 0749   midodrine (PROAMATINE) tablet 10 mg  10 mg Oral TID WC Simpson,  Wayne Both, NP   10 mg at 07/07/23 1914   mometasone-formoterol (DULERA) 200-5 MCG/ACT inhaler 2 puff  2 puff Inhalation BID Maurilio Lovely D, DO   2 puff at 07/07/23 0814   norepinephrine (LEVOPHED) 16 mg in (0.064 mg/mL) premix infusion  0-40 mcg/min Intravenous Titrated Knute Neu, RPH 16.88 mL/hr at 07/07/23 1400 18 mcg/min at 07/07/23 1400   ondansetron (ZOFRAN) tablet 4 mg  4 mg Oral Q6H PRN Sherryll Burger, Pratik D, DO       Or   ondansetron [facility]) injection 4 mg  4 mg Intravenous Q6H PRN Sherryll Burger, Pratik D, DO   4 mg at 07/06/23 1302   pantoprazole (PROTONIX) injection 40 mg  40 mg Intravenous Q24H Selmer Dominion B, NP   40 mg at 07/07/23 0915   sodium bicarbonate tablet 650 mg  650 mg Oral TID Maurilio Lovely D, DO   650 mg at 07/07/23 7829   tamsulosin  (FLOMAX) capsule 0.4 mg  0.4 mg Oral QPC supper Maurilio Lovely D, DO   0.4 mg at 07/06/23 1823   thiamine (VITAMIN B1) injection 100 mg  100 mg Intravenous Daily Rozann Lesches, MD   100 mg at 07/07/23 0914   vasopressin (PITRESSIN) 20 Units in 100 mL (0.2 unit/mL) infusion-*FOR SHOCK*  0-0.03 Units/min Intravenous Continuous Selmer Dominion B, NP 9 mL/hr at 07/07/23 1400 0.03 Units/min at 07/07/23 1400    Allergies as of 07/05/2023   (No Known Allergies)    Review of Systems  Constitutional:  Positive for weight loss. Negative for chills and fever.  HENT:  Negative for hearing loss and tinnitus.   Eyes:  Negative for blurred vision and double vision.  Respiratory:  Negative for cough and hemoptysis.   Cardiovascular:  Negative for chest pain and orthopnea.  Gastrointestinal:  Positive for abdominal pain, nausea and vomiting. Negative for blood in stool, constipation, diarrhea, heartburn and melena.  Genitourinary:  Negative for dysuria and urgency.  Musculoskeletal:  Negative for myalgias and neck pain.  Skin:  Negative for itching and rash.  Neurological:  Negative for seizures and loss of consciousness.  Psychiatric/Behavioral:  Negative for depression and suicidal ideas.        Physical Exam:  Vital signs in last 24 hours: Temp:  [97.6 F (36.4 C)-98.6 F (37 C)] 97.6 F (36.4 C) (03/08 1130) Pulse Rate:  [99-143] 126 (03/08 1400) Resp:  [18-42] 27 (03/08 1400) BP: (58-129)/(35-76) 89/63 (03/08 1400) SpO2:  [68 %-100 %] 96 % (03/08 1400) Weight:  [75.1 kg] 75.1 kg (03/08 0122) Last BM Date : 07/07/23 Last BM recorded by nurses in past 5 days Stool Type: Type 7 (Liquid consistency with no solid pieces) (07/07/2023  4:00 AM)  Physical Exam Constitutional:      Appearance: He is ill-appearing.  HENT:     Head: Normocephalic and atraumatic.     Nose: Nose normal. No congestion.     Mouth/Throat:     Mouth: Mucous membranes are moist.     Pharynx: Oropharynx is clear.  Eyes:      General: Scleral icterus present.     Extraocular Movements: Extraocular movements intact.  Cardiovascular:     Rate and Rhythm: Regular rhythm. Tachycardia present.  Pulmonary:     Comments: Increased respiratory effort, on [place] Abdominal:     General: Bowel sounds are normal. There is distension.     Palpations: There is no mass.     Tenderness: There is no abdominal tenderness.  Hernia: No hernia is present.  Musculoskeletal:        General: Normal range of motion.     Cervical back: Normal range of motion and neck supple.  Skin:    General: Skin is warm and dry.  Neurological:     General: No focal deficit present.     Mental Status: He is alert and oriented to person, place, and time.  Psychiatric:        Mood and Affect: Mood normal.        Behavior: Behavior normal.        Thought Content: Thought content normal.        Judgment: Judgment normal.      LAB RESULTS: Recent Labs    07/06/23 0440 07/06/23 2253 07/07/23 0449  WBC 15.3* 14.6* 15.9*  HGB 11.0* 9.6* 9.7*  HCT 36.7* 30.0* 30.0*  PLT 164 145* 124*   BMET Recent Labs    07/06/23 1001 07/06/23 2253 07/07/23 0449  NA 132* 135 135  K 5.8* 4.4 4.1  CL 99 100 100  CO2 14* 16* 17*  GLUCOSE 214* 83 114*  BUN 40* 22* 24*  CREATININE 4.81* 3.87* 4.36*  CALCIUM 7.2* 6.8* 6.3*   LFT Recent Labs    07/07/23 0449  PROT 5.8*  ALBUMIN 2.3*  AST 3,980*  ALT 1,636*  ALKPHOS 71  BILITOT 5.4*   PT/INR Recent Labs    07/06/23 0440 07/07/23 0830  LABPROT 50.4* 79.2*  INR 5.5* 9.9*    STUDIES: CT ABDOMEN PELVIS WO CONTRAST Result Date: 07/07/2023 CLINICAL DATA:  58 year old male with abdominal pain and nausea. Recent noncontrast CT suspicious for pancreatitis. EXAM: CT ABDOMEN AND PELVIS WITHOUT CONTRAST TECHNIQUE: Multidetector CT imaging of the abdomen and pelvis was performed following the standard protocol without IV contrast. RADIATION DOSE REDUCTION: This exam was performed according to the  departmental dose-optimization program which includes automated exposure control, adjustment of the mA and/or kV according to patient size and/or use of iterative reconstruction technique. COMPARISON:  Noncontrast CT Abdomen and Pelvis 07/05/2023. FINDINGS: Lower chest: There is a partially visible posterior mediastinal mass on series 3, image 1 which is a level of the lower chest higher than on the recent abdomen CT. And no prior chest CT for comparison. Partially visible mass here is at least 6 cm long axis. Chest CT with IV contrast recommended to further characterize. No pericardial effusion. Small layering pleural effusions are new, greater on the right. Associated mild lung base atelectasis. Hepatobiliary: Hyperdensity again noted within the gallbladder which is partially contracted now. Small volume new perihepatic free fluid which seems to have simple fluid density. No discrete liver lesion on this noncontrast exam. Pancreas: New extensive upper abdominal inflammatory stranding seems to emanate from the lesser sac and root of the small bowel mesentery and the noncontrast pancreas remains indistinct. No organized fluid identified. Most of the free fluid seems to be simple density. Spleen: Adjacent new inflammatory stranding and small volume free fluid, otherwise negative noncontrast appearance. Adrenals/Urinary Tract: Adrenal glands remain normal. Asymmetric left renal atrophy is unchanged. Mild left nephrocalcinosis again noted. Right kidney appears stable and nonobstructed. New bilateral pararenal space inflammation and fluid which appears to be secondary. Nondilated ureters. Decompressed bladder. Pelvic phleboliths. Stomach/Bowel: Scattered new free fluid in the abdomen and pelvis most of which has simple fluid density. No pneumoperitoneum identified. Small volume oral contrast mixed with air in fluid in the stomach. Distal stomach and duodenum appear thick-walled, inflamed. Nondilated small bowel  throughout the abdomen. Large bowel diverticulosis and some large bowel wall thickening. Small volume of oral contrast in the ascending colon and transverse. Descending and rectosigmoid colon appear mildly thickened. Vascular/Lymphatic: Extensive Aortoiliac calcified atherosclerosis. Vascular patency is not evaluated in the absence of IV contrast. No discrete lymphadenopathy. There is a new left femoral venous dual lumen catheter which terminates in a lumbar venous branch. No evidence of surrounding hemorrhage. No retroperitoneal hemorrhage identified elsewhere. Reproductive: Negative. Other: Small volume new free fluid in the pelvis with simple fluid density. Musculoskeletal: Intermittent motion artifact. No acute or suspicious osseous lesion is identified IMPRESSION: 1. Partially visible Posterior Mediastinal Mass, approximately 6 cm. And the right hilum appeared abnormal on recent portable chest x-ray. Recommend Chest CT with IV contrast to further characterize. 2. A left femoral approach dual lumen venous catheter appears to terminates in a small lumbar vein rather than continue into the left Common iliac vein. If this line can be retracted approximately 3 cm then it might be maintained in the left external iliac vein. Otherwise it should be removed, replaced. 3. No retroperitoneal hemorrhage identified. But extensive new inflammatory stranding and scattered free fluid in the abdomen and pelvis which seems to emanate from the lesser sac. Suspect progressive Acute Pancreatitis. No organized fluid collection or other complicating features evident on this noncontrast exam. 4. Widespread but probably secondary inflammation of the bowel with small and large bowel wall thickening. No evidence of bowel obstruction. No pneumoperitoneum. 5.  Aortic Atherosclerosis (ICD10-I70.0). Electronically Signed   By: Odessa Fleming M.D.   On: 07/07/2023 05:47   CT HEAD WO CONTRAST ( ) Result Date: 07/07/2023 CLINICAL DATA:   58 year old male with altered mental status. EXAM: CT HEAD WITHOUT CONTRAST TECHNIQUE: Contiguous axial images were obtained from the base of the skull through the vertex without intravenous contrast. RADIATION DOSE REDUCTION: This exam was performed according to the departmental dose-optimization program which includes automated exposure control, adjustment of the mA and/or kV according to patient size and/or use of iterative reconstruction technique. COMPARISON:  Head CT 02/24/2023. FINDINGS: Brain: No midline shift, ventriculomegaly, mass effect, evidence of mass lesion, intracranial hemorrhage or evidence of cortically based acute infarction. Advanced chronic small vessel disease with chronic lacunar infarcts in both caudate nuclei, Patchy and confluent bilateral cerebral white matter hypodensity with deep white matter capsule involvement. Stable gray-white matter differentiation throughout the brain. Vascular: No suspicious intracranial vascular hyperdensity. Calcified atherosclerosis at the skull base. Skull: Congenital incomplete ossification of the posterior C1 ring, normal variant. Calvarium appears stable and intact. Sinuses/Orbits: Mild new paranasal sinus mucosal thickening, underlying hyperplastic paranasal sinuses which overall remain well aerated. Tympanic cavities and mastoids appear clear. Other: No acute orbit or scalp soft tissue finding. IMPRESSION: 1. No acute intracranial abnormality. 2. Stable non contrast CT appearance of advanced chronic small vessel disease. 3. Mild new paranasal sinus inflammation. Electronically Signed   By: Odessa Fleming M.D.   On: 07/07/2023 05:36   DG CHEST PORT 1 VIEW Result Date: 07/07/2023 CLINICAL DATA:  Tachypnea. EXAM: PORTABLE CHEST 1 VIEW COMPARISON:  03/11/2023 FINDINGS: Low lung volumes. Interstitial markings are diffusely coarsened with chronic features. Basilar atelectasis or infiltrate, right greater than left. Soft tissue fullness in the right hilum raises  concern for lymphadenopathy or central mass lesion. Cardiopericardial silhouette is at upper limits of normal for size. No acute bony abnormality. Telemetry leads overlie the chest. IMPRESSION: Soft tissue fullness in the right hilar region concerning for lymphadenopathy or central pulmonary mass. Chest CT  with contrast recommended to further evaluate. Bibasilar atelectasis or infiltrate. These results will be called to the ordering clinician or representative by the Radiologist Assistant, and communication documented in the PACS or [employer]. Electronically Signed   By: Kennith Center M.D.   On: 07/07/2023 05:24   DG Abd 1 View Result Date: 07/07/2023 CLINICAL DATA:  Abdominal pain. EXAM: ABDOMEN - 1 VIEW COMPARISON:  01/13/2013 FINDINGS: No gaseous bowel dilatation to suggest obstruction. Gas is visible diffusely in the nondilated colon. Left femoral vascular dialysis catheter evident. Phleboliths are seen over the pelvis. IMPRESSION: No evidence for bowel obstruction. Electronically Signed   By: Kennith Center M.D.   On: 07/07/2023 05:22   [place] ASCITES (ABDOMEN LIMITED) Result Date: 07/06/2023 CLINICAL DATA:  Elevated LFTs. Abdominal distension. Paracentesis was requested. EXAM: LIMITED ABDOMEN ULTRASOUND FOR ASCITES TECHNIQUE: Limited ultrasound survey for ascites was performed in all four abdominal quadrants. COMPARISON:  Hepatic ultrasound 07/05/2023. CT of the abdomen pelvis 07/05/2023. FINDINGS: No significant abdominal ascites are present. The patient is not a candidate for paracentesis. IMPRESSION: No significant abdominal ascites. The patient is not a candidate for paracentesis. Electronically Signed   By: Marin Roberts M.D.   On: 07/06/2023 11:57      Impression    58 y.o. male with a history of B12 deficiency, prior LLE DVT on ASA, asthma, CKD, COPD, kidney stones, cirrhosis 2/2 etoh, pancreatitis with hx of pancreas divisum also likely 2/2 etoh and gallstones (not surgery  candidate), gallbladder polyp, chronically elevated ferritin with hemosiderosis. H63D heterozygous (followed with hematology) who presented to the [facility] ED with complaints of abdominal pain with N/V/D 3-4 days prior to arrival. CT with pancreatitis and possible pancreatic mass and newly elevated transaminitis consistent with shock liver. Transferred to [facility] for ICU admission.  Acute on chronic pancreatitis Lipase 843 WBC 15.9 CT worsening acute pancreatitis, posterior mediastinal mass, inflammation of small and large bowel (likely hepatic coagulopathy) MRCP was ordered but not pursued No alcohol since early February, pancreatitis likely from gallstone versus history of pancreatic divisum  Elevated LFTs Suspect shock liver in the setting setting of sepsis possibly secondary to pancreatitis CMV, EBV, Hep B DNA and HEP C RNA pending AST 3980/ALT 1636, improving total bilirubin 5.4, worsening Liver doppler negative INR 9.9  Alcoholic Cirrhosis History of decompensation with ascites and hepatic encephalopathy.  No asterixis on exam.  Paracentesis was not pursued. Ethanol negative MELD 3.0: 42, MELD-Na: 45 3/8    Plan   - Continue to trend LFTs - Serial INR, daily - monitor daily MELD - Broad spectrum abx (with empiric coverage for SBP) + albumin bolus - vitamin K 10mg  po tid x3 - not enough ascites for paracentesis - continue meropenem - Continue Levophed - IgG4 to rule out autoimmune pancreatitis -- Repeat CT scan if concern for necrotizing pancreatitis, pseudocyst, or abscess or no improvement in 48 hours- consider IR or surgical consult.  -1 g/kg per day of Albumin (max 100 g) for 2 days.  - Lactulose, 30ml titrate to 3bms per day - further recommendations per Dr. Adela Lank  Thank you for your kind consultation, we will continue to follow.   Nazar Kuan Leanna Sato  07/07/2023, 2:14 PM

## 2023-07-07 NOTE — Progress Notes (Signed)
 0300 Pt with hypoglycemia 54. Treated with 1 amp dextrose, corrected to 177.   0400 Pt called out for bathroom. Became incontinent of clay colored diarrhea. Subsequently became flushed, cool/clammy, tachy 140s, RR 40s, SHOB, agitated. Previously A + O x 4. Pola Corn called, requested ground team. Pola Corn MD ordered 2mg  ativan x 1, given. Portable CXR ordered and obtained. Abdomen with worsening distention.  0430 Ground team MD + PA arrived. Acutely hypotensive w/ SBP 50s, levophed started and titrated outside of ordered interval per verbal order from [facility] MD. Pt obtunded, CBG 121. Pt with 'puffing' cheek filling respirations. VBG obtained, lactic, CBC, CMP sent to lab. Two amps bicarb given. Vitamin K ordered and given. Portable KUB ordered and obtained. STAT CT Head and Abdomen ordered, pt transported to and from CT w/ this RN, Consulting civil engineer, and [place], PA-C with no issue.   0700 At time of writing, pt still obtunded, speech is unintelligible. RASS -2 to -3 on no sedation. Pressor requirements escalating. RR > 40.

## 2023-07-07 NOTE — Progress Notes (Signed)
 NAME:  [name] [name], MRN:  [id number], DOB:  July 14, 1965, LOS: 2 ADMISSION DATE:  07/05/2023, CONSULTATION DATE:  07/06/2023 REFERRING MD:  Randye Lobo, CHIEF COMPLAINT:  Abdominal Pain   History of Present Illness:  [name] [name] is a 58 year old gentleman with a known medical history significant for hypertension, COPD, alcoholic liver cirrhosis, hemosiderosis, CKD 3B, dyslipidemia, prior left lower extremity DVT presenting to [facility] health ED secondary to 3 to 4-day complaint of abdominal pain.  Patient describes bilateral flank type pain increasing in intensity without radiation to groin chest or back.  He states this is similar pain to his previous admission for pancreatitis.  Patient does elicit nausea and vomiting and diarrhea.   It is noted that the patient does have history of cholelithiasis and decompensated cirrhosis.  Patient reports he has not had any alcohol since his birthday on [birth year], however according to GI note he made comments that his last drink was on this past Sunday.  CT of the abdomen pelvis without contrast performed on July 05, 2023 reveals: IMPRESSION: 1. Pancreas is diffusely ill-defined through the tail region with subtle peripancreatic edema/inflammation. Imaging features compatible with acute pancreatitis. Pancreatic protocol CT recommended if renal function permits. If not, MRI of the abdomen with and without contrast recommended as pancreatic mass lesion cannot be excluded. 2. Hepatic cirrhosis with hepatic steatosis. 3. Cholelithiasis. 4. Left colonic diverticulosis without diverticulitis. 5. Atrophic left kidney.  Abdomen Limited ultrasound performed today 07/06/2023 reveals no significant abdominal ascites.  Laboratory indices: He does have elevation in coagulation studies with a PT of 26 on yesterday that elevated to 50.4 today 3 10/31/2023 and INR at admission yesterday 2.4 elevated to 5.5 today.  CBC: WBC 15.3, hemoglobin 11, hematocrit 36.7, platelets 164.   BMP sodium 132, potassium 5.8, chloride 99, CO2 14 BUN 40, creatinine 4.81 and glucose 214.  LFTs: Lipase 2929, AST 5021, ALT 05/08/2007.  Secondary to patient's known CKD presenting now with AKI and presence of oliguria a temporary HD line was placed by surgery team today for hemodialysis.  Patient underwent 2-hour HD in the ED prior to transfer.  Patient did receive IV fluids for pancreatitis management, as he is a poor candidate for liver transplant due to ongoing alcohol use per GI note.  Consults: Patient has been followed by nephrology and gastrointestinal services Blood cultures sent 07/06/2023 secondary to leukocytosis.  As such patient was transferred to [facility] for ICU monitoring and management and admitted with acute on chronic liver failure/pancreatitis.  Pertinent  Medical History  Asthma, B12 deficiency, cirrhosis, dyspnea, hyperlipidemia, history of kidney stones, hypertension, kidney stones, COPD, prior lower left extremity DVT, chronic anemia.  Significant Hospital Events: Including procedures, antibiotic start and stop dates in addition to other pertinent events   Hemodialysis 07/06/2023 (temporary HD line placed)  Interim History / Subjective:  Worsening agitation/ confusion overnight, hypoglycemia, and hypotensive overnight requiring vasopressor support.  Went for [facility] and CT a/p.  Started on meropenem   Pt currently pleasantly confused, c/o of pain at site of left HD cath, denies nausea. NE 11 mcg  Objective   Blood pressure (!) 84/57, pulse (!) 111, temperature 97.7 F (36.5 C), temperature source Oral, resp. rate (!) 36, height 6' (1.829 m), weight 75.1 kg, SpO2 97%.        Intake/Output Summary (Last 24 hours) at 07/07/2023 0815 Last data filed at 07/07/2023 0700 Gross per 24 hour  Intake 3277.08 ml  Output 0 ml  Net 3277.08 ml  Filed Weights   07/05/23 0551 07/06/23 2030 07/07/23 0122  Weight: 73.9 kg 75.1 kg 75.1 kg    Examination: General:  AoC older  appearing male sitting upright in bed, ill appearing, no acute distress HEENT: MM pink/dry, poor dentition, pupils 2/sluggish Neuro: Awake, oriented to person and year.  Follow simple commands, MAE CV: rr, ST, no murmur, left femoral trialysis cath with previous oozing noted in groin PULM:  tachypneic 30-40's, clear, no wheeze, on 2L at 96% GI: taut, +bs, mild diffuse tenderness, voids- no UOP overnight, bladder scan - Extremities: warm/dry, no LE edema  Skin: no rashes  No UOP overnight Afebrile  3/8 CTH >  neg 3/8 CT a/p>  1. Partially visible Posterior Mediastinal Mass, approximately 6 cm. And the right hilum appeared abnormal on recent portable chest x-ray. Recommend Chest CT with IV contrast to further characterize. 2. A left femoral approach dual lumen venous catheter appears to terminates in a small lumbar vein rather than continue into the left Common iliac vein. If this line can be retracted approximately 3 cm then it might be maintained in the left external iliac vein. Otherwise it should be removed, replaced. 3. No retroperitoneal hemorrhage identified. But extensive new inflammatory stranding and scattered free fluid in the abdomen and pelvis which seems to emanate from the lesser sac. Suspect progressive Acute Pancreatitis. No organized fluid collection or other complicating features evident on this noncontrast exam. 4. Widespread but probably secondary inflammation of the bowel with small and large bowel wall thickening. No evidence of bowel obstruction. No pneumoperitoneum. 5.  Aortic Atherosclerosis  Resolved Hospital Problem list   N/A  Assessment & Plan:  Shock- septic 2/2 acute pancreatitis Acute pancreatitis  Decompensated ETOH cirrhosis  Coagulapathy thrombocytopenia -Has history of hemosiderosis and follows hematology -No alcohol use noted since 2/8 according to patient - liver Doppler/ultrasound negative, no significant ascites P:  - pending INR,  s/p vit K this am - 2L LR now, IVC still with resp variation on bedside POCU - cont NE for MAP goal > 65, add oral midodrine  - follow cultures, trend WBC/ fever curve  - cont meropenem  - repeat lactic  - ammonia, lipase pending - H/H remains stable, trend closely, holding asa for now - awaiting further recs per GI, appreciate assistance - acute hepatitis panel neg - cont to trend LFTs/ INR - D5 NACL@125  ml/hr - Keep n.p.o. except for sips with medications and ice chips - Pain management and antiemetics - PPI  Recurrent hypoglycemia - secondary to liver dysfunction - start D10 and titrate as able  Acute encephalopathy, suspected metabolic/ hepatic - CTH neg overnight - ammonia pending - monitor for recurrent hypoglycemia - supportive care - neuro watch   AKI on CKD stage IIIb with oliguria Hyperkalemia, resolved Hyponatremia, resolved Hypocalcemia - Baseline creatinine near 2.0 and currently at 3.6 on admit, s/p 2hr of emergency dialysis 3/7 P:  - Nephrology following, appreciate input.  No acute electrolyte derangements but given pressor requirements will need CRRT vs ongoing GOC - calcium gluconate 2gm - cont oral bicarb - left femoral trialysis line retracted by 3cm, likely needs to be replaced at some point pending GOC - trend renal indices  - strict I/Os, daily wts, bladder scan q shift - avoid nephrotoxins, renal dose meds, hemodynamic support as above   COPD Atelectasis 2/2 abd process - no evidence of AE - prn duonebs, cont dulera - minimal O2 needs but ongoing tachypnea 2/2 intra-abd process, consider HHFNC for  support.  Not hypercarbic on VBG this am   Folate/B12/B6 deficiencies - Continue supplementation and follow-up with hematology   Prior history of left lower extremity DVT- ?2022 - holding asa and empiric heparin SQ given coagulapathy and thrombocytopenia   Dyslipidemia - Hold statin given transaminitis   Hypertension - cont to hold pta while  on vasopressors  Possible mediastinal mass - will need CT chest to further evaluate when stable  Best Practice (right click and "Reselect all SmartList Selections" daily)   Diet/type: NPO DVT prophylaxis prophylactic heparin > hold Pressure ulcer(s): N/A GI prophylaxis: N/A Lines: Dialysis Catheter Foley:  N/A Code Status:  full code Last date of multidisciplinary goals of care discussion []   Attempted to contact NOK Jeannette How, aunt at [phone], no answer.  Consult to PMT placed to assist with ongoing GOC   Labs   CBC: Recent Labs  Lab 07/05/23 0615 07/06/23 0440 07/06/23 2253 07/07/23 0449  WBC 8.0 15.3* 14.6* 15.9*  HGB 14.1 11.0* 9.6* 9.7*  HCT 44.1 36.7* 30.0* 30.0*  MCV 91.5 96.6 92.6 91.7  PLT 225 164 145* 124*    Basic Metabolic Panel: Recent Labs  Lab 07/05/23 0615 07/06/23 0440 07/06/23 0921 07/06/23 1001 07/06/23 2253 07/07/23 0449  NA 129* 133*  --  132* 135 135  K 5.4* 6.7* 5.8* 5.8* 4.4 4.1  CL 97* 103  --  99 100 100  CO2 21* 14*  --  14* 16* 17*  GLUCOSE 106* 21*  --  214* 83 114*  BUN 36* 38*  --  40* 22* 24*  CREATININE 3.63* 4.39*  --  4.81* 3.87* 4.36*  CALCIUM 8.4* 7.1*  --  7.2* 6.8* 6.3*  MG  --  1.6*  --   --   --   --    GFR: Estimated Creatinine Clearance: 19.6 mL/min (A) (by C-G formula based on SCr of 4.36 mg/dL (H)). Recent Labs  Lab 07/05/23 0615 07/06/23 0440 07/06/23 2253 07/07/23 0449  WBC 8.0 15.3* 14.6* 15.9*    Liver Function Tests: Recent Labs  Lab 07/05/23 0615 07/06/23 0440 07/07/23 0449  AST 2,211* 5,021* 3,980*  ALT 728* 1,709* 1,636*  ALKPHOS 103 85 71  BILITOT 3.0* 3.7* 5.4*  PROT 8.8* 7.1 5.8*  ALBUMIN 2.8* 2.4* 2.3*   Recent Labs  Lab 07/05/23 0615 07/06/23 0440  LIPASE 843* 2,929*   No results for input(s): "AMMONIA" in the last 168 hours.  ABG    Component Value Date/Time   HCO3 14.2 (L) 07/07/2023 0442   TCO2 26 07/22/2020 1324   ACIDBASEDEF 12.3 (H) 07/07/2023 0442   O2SAT  67.1 07/07/2023 0442     Coagulation Profile: Recent Labs  Lab 07/05/23 0750 07/06/23 0440  INR 2.4* 5.5*    Cardiac Enzymes: No results for input(s): "CKTOTAL", "CKMB", "CKMBINDEX", "TROPONINI" in the last 168 hours.  HbA1C: Hgb A1c MFr Bld  Date/Time Value Ref Range Status  05/27/2019 12:57 PM 5.7 (H) 4.8 - 5.6 % Final    Comment:    (NOTE) Pre diabetes:          5.7%-6.4% Diabetes:              >6.4% Glycemic control for   <7.0% adults with diabetes     CBG: Recent Labs  Lab 07/06/23 2337 07/07/23 0311 07/07/23 0329 07/07/23 0421 07/07/23 0745  GLUCAP 71 57* 177* 115* 65*    Allergies No Known Allergies   Home Medications  Prior to Admission medications  Medication Sig Start Date End Date Taking? Authorizing Provider  albuterol (PROVENTIL HFA) 108 (90 Base) MCG/ACT inhaler INHALE 2 PUFFS BY MOUTH EVERY 6 HOURS AS NEEDED FOR COUGHING, WHEEZING, OR SHORTNESS OF BREATH Patient taking differently: Inhale 2 puffs into the lungs every 6 (six) hours as needed for shortness of breath or wheezing. 02/10/21  Yes Shon Hale, MD  aspirin EC 81 MG tablet Take 81 mg by mouth daily.   Yes [provider]  atorvastatin (LIPITOR) 20 MG tablet Take 20 mg by mouth daily. 05/23/23  Yes [provider]  cyanocobalamin (VITAMIN B12) 1000 MCG/ML injection Inject 1,000 mcg into the muscle every 30 (thirty) days.   Yes [provider]  feeding supplement (ENSURE ENLIVE / ENSURE PLUS) LIQD Take 237 mLs by mouth 3 (three) times daily between meals. 03/14/23  Yes Shah, Pratik D, DO  furosemide (LASIX) 20 MG tablet TAKE 2 TABLETS BY MOUTH EVERY MORNING AND 1 TABLET IN THE AFTERNOON Patient taking differently: Take 20 mg by mouth See admin instructions. Take 40 mg in the morning and 20 mg every afternoon 01/12/23  Yes Gelene Mink, NP  metoprolol tartrate (LOPRESSOR) 25 MG tablet Take 25 mg by mouth 2 (two) times daily. 05/24/21  Yes [provider]   mometasone-formoterol (DULERA) 200-5 MCG/ACT AERO Inhale 2 puffs into the lungs 2 (two) times daily. 02/10/21  Yes Emokpae, Courage, MD  ondansetron (ZOFRAN) 4 MG tablet Take 1 tablet (4 mg total) by mouth daily as needed for nausea or vomiting. 03/14/23 03/13/24 Yes Shah, Pratik D, DO  pantoprazole (PROTONIX) 40 MG tablet TAKE 1 TABLET(40 MG) BY MOUTH DAILY Patient taking differently: Take 40 mg by mouth daily. 09/18/22  Yes Gelene Mink, NP  sodium bicarbonate 650 MG tablet Take 1 tablet (650 mg total) by mouth 3 (three) times daily. 09/25/22  Yes Vassie Loll, MD  spironolactone (ALDACTONE) 50 MG tablet TAKE 2 TABLETS BY MOUTH EVERY MORNING AND 1 TABLET DAILY IN THE AFTERNOON Patient taking differently: Take 50 mg by mouth See admin instructions. Take 100 mg in the morning and 50 mg in the afternoon 12/28/22  Yes Gelene Mink, NP  tamsulosin (FLOMAX) 0.4 MG CAPS capsule Take 1 capsule (0.4 mg total) by mouth daily after supper. 05/10/23  Yes Gelene Mink, NP     CCT: 45 mins     Posey Boyer, MSN, AG-ACNP-BC [place] [facility] 07/07/2023, 9:15 AM  See Amion for pager If no response to pager , please call [phone] until 7pm After 7:00 pm call Elink  [phone]

## 2023-07-07 NOTE — Procedures (Signed)
 Arterial Catheter Insertion Procedure Note  [name] [name]  [id number]  10-Mar-1966  Date:07/07/23  Time:7:14 PM    Provider Performing: Posey Boyer    Procedure: Insertion of Arterial Line (40981) with US guidance (19147)   Indication(s) Blood pressure monitoring and/or need for frequent ABGs  Consent Risks of the procedure as well as the alternatives and risks of each were explained to the patient and/or caregiver.  Consent for the procedure was obtained and is signed in the bedside chart  Anesthesia None   Time Out Verified patient identification, verified procedure, site/side was marked, verified correct patient position, special equipment/implants available, medications/allergies/relevant history reviewed, required imaging and test results available.   Sterile Technique Maximal sterile technique including full sterile barrier drape, hand hygiene, sterile gown, sterile gloves, mask, hair covering, sterile ultrasound probe cover (if used).   Procedure Description Area of catheter insertion was cleaned with chlorhexidine and draped in sterile fashion. With real-time ultrasound guidance an arterial catheter was placed into the right radial artery.  Appropriate arterial tracings confirmed on monitor.     Complications/Tolerance None; patient tolerated the procedure well.   EBL N/a   Specimen(s) None      Posey Boyer, MSN, AG-ACNP-BC [place] [facility] 07/07/2023, 7:15 PM  See Amion for pager If no response to pager , please call [phone] until 7pm After 7:00 pm call Elink  [phone]

## 2023-07-07 NOTE — Progress Notes (Signed)
 Spoke with aunt who deferred me to his sister, [name] [name] [phone], NOK who lives in [place]. Updated on pt's condition/ MODS.  Clarified GOC.  Relayed concerns that pt may not survive this hospitalization.  Family to consider GOC but wishes to remains full code for now.         [name] Boyer, MSN, AG-ACNP-BC [place]  [facility] 07/07/2023, 11:37 AM  See Amion for pager If no response to pager , please call [phone] until 7pm After 7:00 pm call Elink  [phone]

## 2023-07-07 NOTE — Progress Notes (Signed)
 Called by eLink to evaluate this patient as he was less responsive and tachy and tachypnic to 50's.  He received 2mg  Ativan  a few minutes ago.  He is less responsive and almost obtunded.  Some response to sternal rub.  SBP 58, placed on Levophed now SBP 92.   Possible bleed given his high INR. Plan: Stat CBC CT abd/pelvis for retroperitoneal bleed, no obvious evidence of bleed. 3. CT head, although he received 2mg  Ativan, his decreased mentation may not be only related to the Ativan.  The patient is critically ill with multiple organ system failure and requires high complexity decision making for assessment and support, frequent evaluation and titration of therapies, advanced monitoring, review of radiographic studies and interpretation of complex data.   Critical Care Time devoted to patient care services, exclusive of separately billable procedures, described in this note is 30 extra minutes.   Rozann Lesches MD [place] [facility] See Amion for pager  If no response to pager , please call [phone] until 7pm After 7:00 pm call Elink  [phone] 07/07/2023, 4:51 AM

## 2023-07-08 ENCOUNTER — Inpatient Hospital Stay (HOSPITAL_COMMUNITY)

## 2023-07-08 DIAGNOSIS — D689 Coagulation defect, unspecified: Secondary | ICD-10-CM | POA: Diagnosis not present

## 2023-07-08 DIAGNOSIS — J9601 Acute respiratory failure with hypoxia: Secondary | ICD-10-CM

## 2023-07-08 DIAGNOSIS — A419 Sepsis, unspecified organism: Secondary | ICD-10-CM | POA: Diagnosis not present

## 2023-07-08 DIAGNOSIS — K7200 Acute and subacute hepatic failure without coma: Secondary | ICD-10-CM | POA: Diagnosis not present

## 2023-07-08 DIAGNOSIS — R748 Abnormal levels of other serum enzymes: Secondary | ICD-10-CM

## 2023-07-08 DIAGNOSIS — K7030 Alcoholic cirrhosis of liver without ascites: Secondary | ICD-10-CM | POA: Diagnosis not present

## 2023-07-08 DIAGNOSIS — R6521 Severe sepsis with septic shock: Secondary | ICD-10-CM | POA: Diagnosis not present

## 2023-07-08 DIAGNOSIS — K852 Alcohol induced acute pancreatitis without necrosis or infection: Secondary | ICD-10-CM | POA: Diagnosis not present

## 2023-07-08 DIAGNOSIS — E162 Hypoglycemia, unspecified: Secondary | ICD-10-CM | POA: Diagnosis not present

## 2023-07-08 DIAGNOSIS — K859 Acute pancreatitis without necrosis or infection, unspecified: Secondary | ICD-10-CM | POA: Diagnosis not present

## 2023-07-08 LAB — LIPASE, BLOOD: Lipase: 328 U/L — ABNORMAL HIGH (ref 11–51)

## 2023-07-08 LAB — POCT I-STAT 7, (LYTES, BLD GAS, ICA,H+H)
Acid-Base Excess: 0 mmol/L (ref 0.0–2.0)
Acid-Base Excess: 0 mmol/L (ref 0.0–2.0)
Acid-base deficit: 10 mmol/L — ABNORMAL HIGH (ref 0.0–2.0)
Bicarbonate: 16.3 mmol/L — ABNORMAL LOW (ref 20.0–28.0)
Bicarbonate: 23.8 mmol/L (ref 20.0–28.0)
Bicarbonate: 24.8 mmol/L (ref 20.0–28.0)
Calcium, Ion: 0.89 mmol/L — CL (ref 1.15–1.40)
Calcium, Ion: 0.92 mmol/L — ABNORMAL LOW (ref 1.15–1.40)
Calcium, Ion: 0.9 mmol/L — ABNORMAL LOW (ref 1.15–1.40)
HCT: 31 % — ABNORMAL LOW (ref 39.0–52.0)
HCT: 28 % — ABNORMAL LOW (ref 39.0–52.0)
HCT: 27 % — ABNORMAL LOW (ref 39.0–52.0)
Hemoglobin: 10.5 g/dL — ABNORMAL LOW (ref 13.0–17.0)
Hemoglobin: 9.5 g/dL — ABNORMAL LOW (ref 13.0–17.0)
Hemoglobin: 9.2 g/dL — ABNORMAL LOW (ref 13.0–17.0)
O2 Saturation: 87 %
O2 Saturation: 91 %
O2 Saturation: 100 %
Patient temperature: 98.4
Patient temperature: 98.2
Patient temperature: 99.9
Potassium: 4.5 mmol/L (ref 3.5–5.1)
Potassium: 3.9 mmol/L (ref 3.5–5.1)
Potassium: 3.6 mmol/L (ref 3.5–5.1)
Sodium: 137 mmol/L (ref 135–145)
Sodium: 138 mmol/L (ref 135–145)
Sodium: 139 mmol/L (ref 135–145)
TCO2: 17 mmol/L — ABNORMAL LOW (ref 22–32)
TCO2: 25 mmol/L (ref 22–32)
TCO2: 26 mmol/L (ref 22–32)
pCO2 arterial: 35.8 mmHg (ref 32–48)
pCO2 arterial: 35.4 mmHg (ref 32–48)
pCO2 arterial: 39.1 mmHg (ref 32–48)
pH, Arterial: 7.265 — ABNORMAL LOW (ref 7.35–7.45)
pH, Arterial: 7.435 (ref 7.35–7.45)
pH, Arterial: 7.412 (ref 7.35–7.45)
pO2, Arterial: 60 mmHg — ABNORMAL LOW (ref 83–108)
pO2, Arterial: 59 mmHg — ABNORMAL LOW (ref 83–108)
pO2, Arterial: 182 mmHg — ABNORMAL HIGH (ref 83–108)

## 2023-07-08 LAB — RENAL FUNCTION PANEL
Albumin: 2.4 g/dL — ABNORMAL LOW (ref 3.5–5.0)
Anion gap: 18 — ABNORMAL HIGH (ref 5–15)
BUN: 22 mg/dL — ABNORMAL HIGH (ref 6–20)
CO2: 22 mmol/L (ref 22–32)
Calcium: 7.1 mg/dL — ABNORMAL LOW (ref 8.9–10.3)
Chloride: 98 mmol/L (ref 98–111)
Creatinine, Ser: 3.71 mg/dL — ABNORMAL HIGH (ref 0.61–1.24)
GFR, Estimated: 18 mL/min — ABNORMAL LOW (ref >60)
Glucose, Bld: 127 mg/dL — ABNORMAL HIGH (ref 70–99)
Phosphorus: 2.2 mg/dL — ABNORMAL LOW (ref 2.5–4.6)
Potassium: 3.8 mmol/L (ref 3.5–5.1)
Sodium: 138 mmol/L (ref 135–145)

## 2023-07-08 LAB — COMPREHENSIVE METABOLIC PANEL
ALT: 1447 U/L — ABNORMAL HIGH (ref 0–44)
AST: 2731 U/L — ABNORMAL HIGH (ref 15–41)
Albumin: 2.3 g/dL — ABNORMAL LOW (ref 3.5–5.0)
Alkaline Phosphatase: 54 U/L (ref 38–126)
Anion gap: 25 — ABNORMAL HIGH (ref 5–15)
BUN: 26 mg/dL — ABNORMAL HIGH (ref 6–20)
CO2: 15 mmol/L — ABNORMAL LOW (ref 22–32)
Calcium: 6.6 mg/dL — ABNORMAL LOW (ref 8.9–10.3)
Chloride: 96 mmol/L — ABNORMAL LOW (ref 98–111)
Creatinine, Ser: 5.01 mg/dL — ABNORMAL HIGH (ref 0.61–1.24)
GFR, Estimated: 13 mL/min — ABNORMAL LOW (ref >60)
Glucose, Bld: 154 mg/dL — ABNORMAL HIGH (ref 70–99)
Potassium: 4.2 mmol/L (ref 3.5–5.1)
Sodium: 136 mmol/L (ref 135–145)
Total Bilirubin: 6.6 mg/dL — ABNORMAL HIGH (ref 0.0–1.2)
Total Protein: 5.7 g/dL — ABNORMAL LOW (ref 6.5–8.1)

## 2023-07-08 LAB — CBC
HCT: 27.7 % — ABNORMAL LOW (ref 39.0–52.0)
Hemoglobin: 9.1 g/dL — ABNORMAL LOW (ref 13.0–17.0)
MCH: 29.7 pg (ref 26.0–34.0)
MCHC: 32.9 g/dL (ref 30.0–36.0)
MCV: 90.5 fL (ref 80.0–100.0)
Platelets: 132 10*3/uL — ABNORMAL LOW (ref 150–400)
RBC: 3.06 MIL/uL — ABNORMAL LOW (ref 4.22–5.81)
RDW: 15.9 % — ABNORMAL HIGH (ref 11.5–15.5)
WBC: 18.1 10*3/uL — ABNORMAL HIGH (ref 4.0–10.5)
nRBC: 0.3 % — ABNORMAL HIGH (ref 0.0–0.2)

## 2023-07-08 LAB — MAGNESIUM
Magnesium: 1.5 mg/dL — ABNORMAL LOW (ref 1.7–2.4)
Magnesium: 2.1 mg/dL (ref 1.7–2.4)

## 2023-07-08 LAB — PHOSPHORUS: Phosphorus: 3.8 mg/dL (ref 2.5–4.6)

## 2023-07-08 LAB — GLUCOSE, CAPILLARY
Glucose-Capillary: 69 mg/dL — ABNORMAL LOW (ref 70–99)
Glucose-Capillary: 114 mg/dL — ABNORMAL HIGH (ref 70–99)
Glucose-Capillary: 109 mg/dL — ABNORMAL HIGH (ref 70–99)
Glucose-Capillary: 107 mg/dL — ABNORMAL HIGH (ref 70–99)
Glucose-Capillary: 115 mg/dL — ABNORMAL HIGH (ref 70–99)
Glucose-Capillary: 122 mg/dL — ABNORMAL HIGH (ref 70–99)
Glucose-Capillary: 122 mg/dL — ABNORMAL HIGH (ref 70–99)

## 2023-07-08 LAB — PROTIME-INR
INR: 5.3 (ref 0.8–1.2)
Prothrombin Time: 48.8 s — ABNORMAL HIGH (ref 11.4–15.2)

## 2023-07-08 MED ORDER — MIDAZOLAM HCL 2 MG/2ML IJ SOLN
INTRAMUSCULAR | Status: AC
  Administered 2023-07-08: 2 mg via INTRAVENOUS
  Filled 2023-07-08: qty 2

## 2023-07-08 MED ORDER — BUDESONIDE 0.5 MG/2ML IN SUSP
0.5000 mg | Freq: Two times a day (BID) | RESPIRATORY_TRACT | Status: DC
  Administered 2023-07-08 – 2023-07-23 (×28): 0.5 mg via RESPIRATORY_TRACT
  Filled 2023-07-08 (×28): qty 2

## 2023-07-08 MED ORDER — PRISMASOL BGK 4/2.5 32-4-2.5 MEQ/L EC SOLN: Status: DC

## 2023-07-08 MED ORDER — FENTANYL CITRATE PF 50 MCG/ML IJ SOSY
PREFILLED_SYRINGE | INTRAMUSCULAR | Status: AC
  Filled 2023-07-08: qty 2

## 2023-07-08 MED ORDER — DEXMEDETOMIDINE HCL IN NACL 400 MCG/100ML IV SOLN
INTRAVENOUS | Status: AC
  Administered 2023-07-08: 0.4 ug/kg/h via INTRAVENOUS
  Filled 2023-07-08: qty 100

## 2023-07-08 MED ORDER — ALBUMIN HUMAN 25 % IV SOLN: 25.0000 g | Freq: Once | INTRAVENOUS | Status: AC

## 2023-07-08 MED ORDER — FENTANYL 2500MCG IN NS 250ML (10MCG/ML) PREMIX INFUSION
0.0000 ug/h | INTRAVENOUS | Status: DC
  Administered 2023-07-08: 25 ug/h via INTRAVENOUS
  Filled 2023-07-08: qty 250

## 2023-07-08 MED ORDER — HEPARIN SODIUM (PORCINE) 1000 UNIT/ML DIALYSIS
1000.0000 [IU] | INTRAMUSCULAR | Status: DC | PRN
  Administered 2023-07-10: 1000 [IU] via INTRAVENOUS_CENTRAL
  Administered 2023-07-11: 2800 [IU] via INTRAVENOUS_CENTRAL
  Filled 2023-07-08 (×2): qty 6
  Filled 2023-07-08 (×2): qty 3

## 2023-07-08 MED ORDER — SODIUM CHLORIDE 0.9 % IV SOLN: 1.0000 g | Freq: Three times a day (TID) | INTRAVENOUS | Status: DC

## 2023-07-08 MED ORDER — POLYETHYLENE GLYCOL 3350 17 G PO PACK
17.0000 g | PACK | Freq: Every day | ORAL | Status: DC
  Administered 2023-07-09: 17 g
  Filled 2023-07-08: qty 1

## 2023-07-08 MED ORDER — ROCURONIUM BROMIDE 10 MG/ML (PF) SYRINGE
PREFILLED_SYRINGE | INTRAVENOUS | Status: AC
  Filled 2023-07-08: qty 10

## 2023-07-08 MED ORDER — ETOMIDATE 2 MG/ML IV SOLN
20.0000 mg | Freq: Once | INTRAVENOUS | Status: AC
  Administered 2023-07-08: 20 mg via INTRAVENOUS

## 2023-07-08 MED ORDER — MIDAZOLAM HCL 2 MG/2ML IJ SOLN: 2.0000 mg | Freq: Once | INTRAMUSCULAR | Status: AC

## 2023-07-08 MED ORDER — MIDODRINE HCL 5 MG PO TABS
15.0000 mg | ORAL_TABLET | Freq: Three times a day (TID) | ORAL | Status: DC
  Administered 2023-07-09 – 2023-07-11 (×7): 15 mg
  Filled 2023-07-08 (×7): qty 3

## 2023-07-08 MED ORDER — ALBUMIN HUMAN 25 % IV SOLN
25.0000 g | Freq: Once | INTRAVENOUS | Status: AC
  Administered 2023-07-08: 25 g via INTRAVENOUS
  Filled 2023-07-08: qty 100

## 2023-07-08 MED ORDER — ORAL CARE MOUTH RINSE: 15.0000 mL | OROMUCOSAL | Status: DC | PRN

## 2023-07-08 MED ORDER — SODIUM CHLORIDE 0.9 % IV SOLN
1.0000 g | Freq: Three times a day (TID) | INTRAVENOUS | Status: DC
  Administered 2023-07-08 – 2023-07-10 (×7): 1 g via INTRAVENOUS
  Filled 2023-07-08 (×7): qty 20

## 2023-07-08 MED ORDER — PHENYLEPHRINE 80 MCG/ML (10ML) SYRINGE FOR IV PUSH (FOR BLOOD PRESSURE SUPPORT)
PREFILLED_SYRINGE | INTRAVENOUS | Status: AC
  Filled 2023-07-08: qty 10

## 2023-07-08 MED ORDER — KETAMINE HCL 50 MG/5ML IJ SOSY
PREFILLED_SYRINGE | INTRAMUSCULAR | Status: AC
  Filled 2023-07-08: qty 10

## 2023-07-08 MED ORDER — CALCIUM GLUCONATE-NACL 2-0.675 GM/100ML-% IV SOLN
2.0000 g | Freq: Once | INTRAVENOUS | Status: AC
  Administered 2023-07-08: 2000 mg via INTRAVENOUS
  Filled 2023-07-08: qty 100

## 2023-07-08 MED ORDER — SODIUM BICARBONATE 8.4 % IV SOLN: 100.0000 meq | Freq: Once | INTRAVENOUS | Status: AC

## 2023-07-08 MED ORDER — SUCCINYLCHOLINE CHLORIDE 200 MG/10ML IV SOSY
PREFILLED_SYRINGE | INTRAVENOUS | Status: AC
  Filled 2023-07-08: qty 10

## 2023-07-08 MED ORDER — DEXTROSE 50 % IV SOLN
INTRAVENOUS | Status: AC
  Administered 2023-07-08: 12.5 g via INTRAVENOUS
  Filled 2023-07-08: qty 50

## 2023-07-08 MED ORDER — ETOMIDATE 2 MG/ML IV SOLN: 15.0000 mg | Freq: Once | INTRAVENOUS | Status: AC

## 2023-07-08 MED ORDER — PHENYLEPHRINE HCL-NACL 20-0.9 MG/250ML-% IV SOLN: 0.0000 ug/min | INTRAVENOUS | Status: DC

## 2023-07-08 MED ORDER — SODIUM BICARBONATE 8.4 % IV SOLN
INTRAVENOUS | Status: AC
  Administered 2023-07-08: 100 meq via INTRAVENOUS
  Filled 2023-07-08: qty 100

## 2023-07-08 MED ORDER — FENTANYL CITRATE PF 50 MCG/ML IJ SOSY
PREFILLED_SYRINGE | INTRAMUSCULAR | Status: AC
  Administered 2023-07-08: 50 ug via INTRAVENOUS
  Filled 2023-07-08: qty 2

## 2023-07-08 MED ORDER — ETOMIDATE 2 MG/ML IV SOLN
INTRAVENOUS | Status: AC
Start: 2023-07-08 — End: 2023-07-08
  Administered 2023-07-08: 15 mg via INTRAVENOUS
  Filled 2023-07-08: qty 20

## 2023-07-08 MED ORDER — LACTULOSE 10 GM/15ML PO SOLN
20.0000 g | Freq: Three times a day (TID) | ORAL | Status: DC
  Administered 2023-07-08 – 2023-07-09 (×2): 20 g
  Filled 2023-07-08 (×2): qty 30

## 2023-07-08 MED ORDER — MAGNESIUM SULFATE 2 GM/50ML IV SOLN
2.0000 g | Freq: Once | INTRAVENOUS | Status: AC
Start: 2023-07-08 — End: 2023-07-08
  Administered 2023-07-08: 2 g via INTRAVENOUS
  Filled 2023-07-08: qty 50

## 2023-07-08 MED ORDER — EPINEPHRINE HCL 5 MG/250ML IV SOLN IN NS
0.5000 ug/min | INTRAVENOUS | Status: DC
  Administered 2023-07-08: 0.5 ug/min via INTRAVENOUS
  Administered 2023-07-08: 20 ug/min via INTRAVENOUS
  Administered 2023-07-09: 12 ug/min via INTRAVENOUS
  Filled 2023-07-08 (×2): qty 250

## 2023-07-08 MED ORDER — FENTANYL CITRATE PF 50 MCG/ML IJ SOSY: 50.0000 ug | PREFILLED_SYRINGE | Freq: Once | INTRAMUSCULAR | Status: AC

## 2023-07-08 MED ORDER — DEXTROSE 50 % IV SOLN: 12.5000 g | Freq: Once | INTRAVENOUS | Status: AC

## 2023-07-08 MED ORDER — VITAMIN K1 10 MG/ML IJ SOLN
10.0000 mg | Freq: Once | INTRAMUSCULAR | Status: AC
  Administered 2023-07-08: 10 mg via INTRAVENOUS
  Filled 2023-07-08: qty 1

## 2023-07-08 MED ORDER — SODIUM BICARBONATE 8.4 % IV SOLN
50.0000 meq | Freq: Once | INTRAVENOUS | Status: AC
  Administered 2023-07-08: 50 meq via INTRAVENOUS

## 2023-07-08 MED ORDER — ARFORMOTEROL TARTRATE 15 MCG/2ML IN NEBU
15.0000 ug | INHALATION_SOLUTION | Freq: Two times a day (BID) | RESPIRATORY_TRACT | Status: DC
  Administered 2023-07-08 – 2023-07-23 (×27): 15 ug via RESPIRATORY_TRACT
  Filled 2023-07-08 (×26): qty 2

## 2023-07-08 MED ORDER — DEXTROSE 50 % IV SOLN
12.5000 g | Freq: Once | INTRAVENOUS | Status: DC
Start: 2023-07-08 — End: 2023-07-08

## 2023-07-08 MED ORDER — SODIUM BICARBONATE 8.4 % IV SOLN
INTRAVENOUS | Status: AC
  Filled 2023-07-08: qty 50

## 2023-07-08 MED ORDER — ORAL CARE MOUTH RINSE
15.0000 mL | OROMUCOSAL | Status: DC
  Administered 2023-07-08 – 2023-07-11 (×32): 15 mL via OROMUCOSAL

## 2023-07-08 MED ORDER — LACTULOSE ENEMA
300.0000 mL | Freq: Three times a day (TID) | ORAL | Status: DC
  Filled 2023-07-08 (×2): qty 300

## 2023-07-08 MED ORDER — SODIUM BICARBONATE 8.4 % IV SOLN
INTRAVENOUS | Status: AC
  Filled 2023-07-08: qty 100

## 2023-07-08 MED ORDER — ALBUMIN HUMAN 25 % IV SOLN
INTRAVENOUS | Status: AC
  Administered 2023-07-08: 25 g via INTRAVENOUS
  Filled 2023-07-08: qty 100

## 2023-07-08 MED ORDER — DEXMEDETOMIDINE HCL IN NACL 400 MCG/100ML IV SOLN
0.0000 ug/kg/h | INTRAVENOUS | Status: DC
  Administered 2023-07-08: 1 ug/kg/h via INTRAVENOUS
  Administered 2023-07-08: 0.8 ug/kg/h via INTRAVENOUS
  Administered 2023-07-09: 1 ug/kg/h via INTRAVENOUS
  Administered 2023-07-09: 0.8 ug/kg/h via INTRAVENOUS
  Administered 2023-07-09 (×2): 1 ug/kg/h via INTRAVENOUS
  Administered 2023-07-10: 0.4 ug/kg/h via INTRAVENOUS
  Administered 2023-07-10: 1.2 ug/kg/h via INTRAVENOUS
  Administered 2023-07-10: 0.6 ug/kg/h via INTRAVENOUS
  Filled 2023-07-08 (×2): qty 100
  Filled 2023-07-08: qty 200
  Filled 2023-07-08 (×6): qty 100

## 2023-07-08 MED ORDER — EPINEPHRINE HCL 5 MG/250ML IV SOLN IN NS
INTRAVENOUS | Status: AC
Start: 2023-07-08 — End: 2023-07-08
  Filled 2023-07-08: qty 250

## 2023-07-08 MED ORDER — HALOPERIDOL LACTATE 5 MG/ML IJ SOLN: 5.0000 mg | Freq: Once | INTRAMUSCULAR | Status: AC

## 2023-07-08 MED ORDER — HALOPERIDOL LACTATE 5 MG/ML IJ SOLN
INTRAMUSCULAR | Status: AC
  Administered 2023-07-08: 5 mg via INTRAVENOUS
  Filled 2023-07-08: qty 1

## 2023-07-08 MED ORDER — SODIUM CHLORIDE 0.9 % FOR CRRT: INTRAVENOUS_CENTRAL | Status: DC | PRN

## 2023-07-08 NOTE — Progress Notes (Signed)
 Progress Note  LOS: 3 days  Primary GI: Dr. Jena Gauss  Chief Complaint: Decompensated cirrhosis and acute pancreatitis   Subjective   Patient again agitated overnight requiring Precedex and soft restraints.  At the time of my evaluation, patient sedated and not responsive.  RN notes multiple bowel movements per rectal tube since starting lactulose   Objective   Vital signs in last 24 hours: Temp:  [97.9 F (36.6 C)-98.6 F (37 C)] 98.2 F (36.8 C) (03/09 1127) Pulse Rate:  [110-154] 135 (03/09 1139) Resp:  [16-39] 25 (03/09 1139) BP: (75-105)/(47-67) 95/58 (03/08 2000) SpO2:  [75 %-100 %] 100 % (03/09 1139) Arterial Line BP: (84-136)/(41-62) 134/55 (03/09 1139) FiO2 (%):  [50 %] 50 % (03/09 0753) Weight:  [80.3 kg] 80.3 kg (03/09 0500) Last BM Date : 07/07/23 Last BM recorded by nurses in past 5 days Stool Type: Type 7 (Liquid consistency with no solid pieces) (07/07/2023  4:00 AM)  General:   Critically ill-appearing male in soft restraints Heart:  Regular rate and rhythm; no murmurs Pulm: Tachypneic, no wheezing Abdomen: Mild distention, hypoactive bowel sounds. Neuro: Unresponsive, sedated  Intake/Output from previous day: 03/08 0701 - 03/09 0700 In: 3522.8 [I.V.:2032.1; IV Piggyback:1490.8] Out: 100 [Urine:100] Intake/Output this shift: Total I/O In: 1349.8 [I.V.:492.5; Other:700; IV Piggyback:157.3] Out: 0   Studies/Results: CT ABDOMEN PELVIS WO CONTRAST Result Date: 07/07/2023 CLINICAL DATA:  58 year old male with abdominal pain and nausea. Recent noncontrast CT suspicious for pancreatitis. EXAM: CT ABDOMEN AND PELVIS WITHOUT CONTRAST TECHNIQUE: Multidetector CT imaging of the abdomen and pelvis was performed following the standard protocol without IV contrast. RADIATION DOSE REDUCTION: This exam was performed according to the departmental dose-optimization program which includes automated exposure control, adjustment of the mA and/or kV according to patient size  and/or use of iterative reconstruction technique. COMPARISON:  Noncontrast CT Abdomen and Pelvis 07/05/2023. FINDINGS: Lower chest: There is a partially visible posterior mediastinal mass on series 3, image 1 which is a level of the lower chest higher than on the recent abdomen CT. And no prior chest CT for comparison. Partially visible mass here is at least 6 cm long axis. Chest CT with IV contrast recommended to further characterize. No pericardial effusion. Small layering pleural effusions are new, greater on the right. Associated mild lung base atelectasis. Hepatobiliary: Hyperdensity again noted within the gallbladder which is partially contracted now. Small volume new perihepatic free fluid which seems to have simple fluid density. No discrete liver lesion on this noncontrast exam. Pancreas: New extensive upper abdominal inflammatory stranding seems to emanate from the lesser sac and root of the small bowel mesentery and the noncontrast pancreas remains indistinct. No organized fluid identified. Most of the free fluid seems to be simple density. Spleen: Adjacent new inflammatory stranding and small volume free fluid, otherwise negative noncontrast appearance. Adrenals/Urinary Tract: Adrenal glands remain normal. Asymmetric left renal atrophy is unchanged. Mild left nephrocalcinosis again noted. Right kidney appears stable and nonobstructed. New bilateral pararenal space inflammation and fluid which appears to be secondary. Nondilated ureters. Decompressed bladder. Pelvic phleboliths. Stomach/Bowel: Scattered new free fluid in the abdomen and pelvis most of which has simple fluid density. No pneumoperitoneum identified. Small volume oral contrast mixed with air in fluid in the stomach. Distal stomach and duodenum appear thick-walled, inflamed. Nondilated small bowel throughout the abdomen. Large bowel diverticulosis and some large bowel wall thickening. Small volume of oral contrast in the ascending colon and  transverse. Descending and rectosigmoid colon appear mildly thickened. Vascular/Lymphatic:  Extensive Aortoiliac calcified atherosclerosis. Vascular patency is not evaluated in the absence of IV contrast. No discrete lymphadenopathy. There is a new left femoral venous dual lumen catheter which terminates in a lumbar venous branch. No evidence of surrounding hemorrhage. No retroperitoneal hemorrhage identified elsewhere. Reproductive: Negative. Other: Small volume new free fluid in the pelvis with simple fluid density. Musculoskeletal: Intermittent motion artifact. No acute or suspicious osseous lesion is identified IMPRESSION: 1. Partially visible Posterior Mediastinal Mass, approximately 6 cm. And the right hilum appeared abnormal on recent portable chest x-ray. Recommend Chest CT with IV contrast to further characterize. 2. A left femoral approach dual lumen venous catheter appears to terminates in a small lumbar vein rather than continue into the left Common iliac vein. If this line can be retracted approximately 3 cm then it might be maintained in the left external iliac vein. Otherwise it should be removed, replaced. 3. No retroperitoneal hemorrhage identified. But extensive new inflammatory stranding and scattered free fluid in the abdomen and pelvis which seems to emanate from the lesser sac. Suspect progressive Acute Pancreatitis. No organized fluid collection or other complicating features evident on this noncontrast exam. 4. Widespread but probably secondary inflammation of the bowel with small and large bowel wall thickening. No evidence of bowel obstruction. No pneumoperitoneum. 5.  Aortic Atherosclerosis (ICD10-I70.0). Electronically Signed   By: Odessa Fleming M.D.   On: 07/07/2023 05:47   CT HEAD WO CONTRAST ( ) Result Date: 07/07/2023 CLINICAL DATA:  58 year old male with altered mental status. EXAM: CT HEAD WITHOUT CONTRAST TECHNIQUE: Contiguous axial images were obtained from the base of the skull  through the vertex without intravenous contrast. RADIATION DOSE REDUCTION: This exam was performed according to the departmental dose-optimization program which includes automated exposure control, adjustment of the mA and/or kV according to patient size and/or use of iterative reconstruction technique. COMPARISON:  Head CT 02/24/2023. FINDINGS: Brain: No midline shift, ventriculomegaly, mass effect, evidence of mass lesion, intracranial hemorrhage or evidence of cortically based acute infarction. Advanced chronic small vessel disease with chronic lacunar infarcts in both caudate nuclei, Patchy and confluent bilateral cerebral white matter hypodensity with deep white matter capsule involvement. Stable gray-white matter differentiation throughout the brain. Vascular: No suspicious intracranial vascular hyperdensity. Calcified atherosclerosis at the skull base. Skull: Congenital incomplete ossification of the posterior C1 ring, normal variant. Calvarium appears stable and intact. Sinuses/Orbits: Mild new paranasal sinus mucosal thickening, underlying hyperplastic paranasal sinuses which overall remain well aerated. Tympanic cavities and mastoids appear clear. Other: No acute orbit or scalp soft tissue finding. IMPRESSION: 1. No acute intracranial abnormality. 2. Stable non contrast CT appearance of advanced chronic small vessel disease. 3. Mild new paranasal sinus inflammation. Electronically Signed   By: Odessa Fleming M.D.   On: 07/07/2023 05:36   DG CHEST PORT 1 VIEW Result Date: 07/07/2023 CLINICAL DATA:  Tachypnea. EXAM: PORTABLE CHEST 1 VIEW COMPARISON:  03/11/2023 FINDINGS: Low lung volumes. Interstitial markings are diffusely coarsened with chronic features. Basilar atelectasis or infiltrate, right greater than left. Soft tissue fullness in the right hilum raises concern for lymphadenopathy or central mass lesion. Cardiopericardial silhouette is at upper limits of normal for size. No acute bony abnormality.  Telemetry leads overlie the chest. IMPRESSION: Soft tissue fullness in the right hilar region concerning for lymphadenopathy or central pulmonary mass. Chest CT with contrast recommended to further evaluate. Bibasilar atelectasis or infiltrate. These results will be called to the ordering clinician or representative by the Radiologist Assistant, and communication documented in the PACS  or [employer]. Electronically Signed   By: Kennith Center M.D.   On: 07/07/2023 05:24   DG Abd 1 View Result Date: 07/07/2023 CLINICAL DATA:  Abdominal pain. EXAM: ABDOMEN - 1 VIEW COMPARISON:  01/13/2013 FINDINGS: No gaseous bowel dilatation to suggest obstruction. Gas is visible diffusely in the nondilated colon. Left femoral vascular dialysis catheter evident. Phleboliths are seen over the pelvis. IMPRESSION: No evidence for bowel obstruction. Electronically Signed   By: Kennith Center M.D.   On: 07/07/2023 05:22    Lab Results: Recent Labs    07/06/23 2253 07/07/23 0449 07/07/23 1937 07/08/23 0122 07/08/23 0423  WBC 14.6* 15.9*  --   --  18.1*  HGB 9.6* 9.7* 11.2* 10.5* 9.1*  HCT 30.0* 30.0* 33.0* 31.0* 27.7*  PLT 145* 124*  --   --  132*   BMET Recent Labs    07/07/23 0449 07/07/23 1643 07/07/23 1937 07/08/23 0122 07/08/23 0423  NA 135 138 136 137 136  K 4.1 4.1 4.5 4.5 4.2  CL 100 95*  --   --  96*  CO2 17* 14*  --   --  15*  GLUCOSE 114* 459*  --   --  154*  BUN 24* 23*  --   --  26*  CREATININE 4.36* 3.85*  --   --  5.01*  CALCIUM 6.3* 5.7*  --   --  6.6*   LFT Recent Labs    07/08/23 0423  PROT 5.7*  ALBUMIN 2.3*  AST 2,731*  ALT 1,447*  ALKPHOS 54  BILITOT 6.6*   PT/INR Recent Labs    07/07/23 1643 07/08/23 0423  LABPROT >90.0* 48.8*  INR >10.0* 5.3*     Scheduled Meds:  Chlorhexidine Gluconate Cloth  6 each Topical Q0600   EPINEPHrine NaCl       folic acid  1 mg Intravenous Daily   lactulose  20 g Oral TID   Or   lactulose  300 mL Rectal TID   midodrine  15  mg Oral TID WC   mometasone-formoterol  2 puff Inhalation BID   pantoprazole (PROTONIX) IV  40 mg Intravenous Q24H   sodium bicarbonate       sodium bicarbonate  650 mg Oral TID   tamsulosin  0.4 mg Oral QPC supper   thiamine (VITAMIN B1) injection  100 mg Intravenous Daily   Continuous Infusions:  acetylcysteine 6.2338 mg/kg/hr (07/08/23 1100)   dexmedetomidine (PRECEDEX) IV infusion 0.2 mcg/kg/hr (07/08/23 1100)   dextrose 60 mL/hr at 07/08/23 1100   meropenem (MERREM) IV Stopped (07/07/23 2142)   norepinephrine (LEVOPHED) Adult infusion 40 mcg/min (07/08/23 1100)   prismasol BGK 4/2.5 1,000 mL/hr at 07/08/23 0953   prismasol BGK 4/2.5 400 mL/hr at 07/08/23 0953   prismasol BGK 4/2.5 1,500 mL/hr at 07/08/23 7829   vasopressin 0.03 Units/min (07/08/23 1100)      Patient profile:   58 y.o. male with a history of B12 deficiency, prior LLE DVT on ASA, asthma, CKD, COPD, kidney stones, cirrhosis 2/2 etoh, pancreatitis with hx of pancreas divisum also likely 2/2 etoh and gallstones (not surgery candidate), gallbladder polyp, chronically elevated ferritin with hemosiderosis. H63D heterozygous (followed with hematology) who presented to the [facility] ED with complaints of abdominal pain with N/V/D 3-4 days prior to arrival. CT with pancreatitis and possible pancreatic mass and newly elevated transaminitis consistent with shock liver in the setting of sepsis secondary to acute pancreatitis and possible component of alcoholic hepatitis. Transferred to [facility] for ICU  admission  EGD/Colonoscopy in 2023. EGD with no varices. Colonoscopy with 11mm polyp (TA) and 3 year recall.   Impression:   Acute on chronic pancreatitis (secondary to gallstones versus alcohol). History of pancreatic divisum on prior MRCP Lipase 843 WBC 15.9 CT worsening acute pancreatitis, posterior mediastinal mass, inflammation of small and large bowel (likely hepatic coagulopathy) MRCP was ordered and pending once  stable IgG4 pending Suspect gallstone pancreatitis but will need MRCP to rule out obstruction.  Previous history of pancreatic divisum MRCP and current alcohol use per family report.   Acute on chronic liver failure Coagulopathy CMV, EBV, Hep B DNA and HEP C RNA pending AST 2731/ALT 1447, improving total bilirubin 6.6, worsening Liver doppler negative INR 5.3 s/p Vit K (>10.0 yesterday) MDF > 32 Critically ill patient with acute on chronic liver failure, possibly shock liver resulting in acute transaminitis and coagulopathy.  Also possibly component of alcoholic hepatitis as well as family admitted to recent alcohol use the patient denied.   Alcoholic Cirrhosis History of decompensation with ascites and hepatic encephalopathy. Paracentesis was not pursued. Ethanol negative MELD 3.0: 42, MELD-Na: 46 3/9  Mediastinal mass 6 mm posterior mediastinal mass noted on CT   Plan:   - Continue to trend LFTs - Serial INR, daily - monitor daily MELD - on pressors for hypotension - Broad spectrum abx - not enough ascites for paracentesis - continue meropenem - IgG4 pending -- Repeat CT scan if concern for necrotizing pancreatitis, pseudocyst - Lactulose, 30ml titrate to 3bms per day - add on rifaxamin 550mg  BID - further recommendations per Dr. Ronnell Guadal[place]ara M Tc [place]  07/08/2023, 11:44 AM

## 2023-07-08 NOTE — Progress Notes (Signed)
 Orthopedic Tech Progress Note Patient Details:  [name] [name] Sep 29, 1965 [id number]  Ortho Devices Type of Ortho Device: Knee Immobilizer Ortho Device/Splint Location: LLE Ortho Device/Splint Interventions: Ordered, Application, Adjustment   Post Interventions Patient Tolerated: Well Instructions Provided: Care of device  [place] A Gerilyn Pilgrim 07/08/2023, 9:13 AM

## 2023-07-08 NOTE — Progress Notes (Addendum)
 eLink Physician-Brief Progress Note Patient Name: [name] [name] DOB: 10/30/1965 MRN: [id number]   Date of Service  07/08/2023  HPI/Events of Note  Notified that the patient is in 3 pressor shock secondary to pancreatitis with respiratory failure with Continues for renal replacement therapy.  Extremely labile blood pressure with blood pressure variability on monitor.  Patient intolerant of turns.  Not pulling on CRRT.  Hypocalcemic.  Receiving small albumin boluses.   eICU Interventions  NG tube to suction.  Obtain bladder pressure x 1.  Discussed the case with the patient's sister.  Discussed the gravity of his illness and extremely high mortality.  Reiterated with the patient is on nearly maximal support and could pass imminently.  No change in care at this time.  Additional albumin, calcium, and add phenylephrine.   2221 - Bladder pressure 17, continue supportive care for now.  Can hold on checking it again unless there is a clinical change.  Intervention Category Major Interventions: Hypotension - evaluation and management  Paiton Boultinghouse 07/08/2023, 8:58 PM

## 2023-07-08 NOTE — Procedures (Signed)
 Intubation Procedure Note  [name] [name]  [id number]  27-May-1965  Date:07/08/23  Time:5:19 PM   Provider Performing:Brooke Lodema Hong    Procedure: Intubation (31500)  Indication(s) Respiratory Failure  Consent Risks of the procedure as well as the alternatives and risks of each were explained to the patient and/or caregiver.  Consent for the procedure was obtained and is signed in the bedside chart   Anesthesia Etomidate, Versed, and Fentanyl   Time Out Verified patient identification, verified procedure, site/side was marked, verified correct patient position, special equipment/implants available, medications/allergies/relevant history reviewed, required imaging and test results available.   Sterile Technique Usual hand hygeine, masks, and gloves were used   Procedure Description Patient positioned in bed supine.  Sedation given as noted above.  Patient was intubated with endotracheal tube using Glidescope.  View was Grade 1 full glottis .  Number of attempts was 1.  Aspirating, copious thick secretions suctioned prior to placing 7.5 ETT/ 24 at lip.  Colorimetric CO2 detector was consistent with tracheal placement.  OGT placed with glidescope as well 55cm at teeth, no bleeding complications.   Complications/Tolerance None; patient tolerated the procedure well. Chest X-ray is ordered to verify placement.   EBL N/a   Specimen(s) None, trach asp ordered      Posey Boyer, MSN, AG-ACNP-BC [facility] 07/08/2023, 5:22 PM  See Amion for pager If no response to pager , please call [phone] until 7pm After 7:00 pm call Elink  [phone]

## 2023-07-08 NOTE — Progress Notes (Signed)
 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  Subjective:  Worsening pressor requirements Minimal urine output Creatinine rising, K4.2, bicarbonate 15 Femoral HD catheter was retracted as radiology suggested  03/08 0701 - 03/09 0700 In: 3522.8 [I.V.:2032.1; IV Piggyback:1490.8] Out: 100 [Urine:100]  Filed Weights   07/06/23 2030 07/07/23 0122 07/08/23 0500  Weight: 75.1 kg 75.1 kg 80.3 kg    Scheduled Meds:  Chlorhexidine Gluconate Cloth  6 each Topical Q0600   EPINEPHrine NaCl       folic acid  1 mg Intravenous Daily   lactulose  20 g Oral TID   Or   lactulose  300 mL Rectal TID   midodrine  15 mg Oral TID WC   mometasone-formoterol  2 puff Inhalation BID   pantoprazole (PROTONIX) IV  40 mg Intravenous Q24H   sodium bicarbonate       sodium bicarbonate  650 mg Oral TID   tamsulosin  0.4 mg Oral QPC supper   thiamine (VITAMIN B1) injection  100 mg Intravenous Daily   Continuous Infusions:  acetylcysteine 6.2338 mg/kg/hr (07/08/23 0700)   calcium gluconate 2,000 mg (07/08/23 1008)   dexmedetomidine (PRECEDEX) IV infusion 1 mcg/kg/hr (07/08/23 0753)   dextrose 60 mL/hr at 07/08/23 0700   meropenem (MERREM) IV Stopped (07/07/23 2142)   norepinephrine (LEVOPHED) Adult infusion 38 mcg/min (07/08/23 0818)   prismasol BGK 4/2.5 1,000 mL/hr at 07/08/23 0953   prismasol BGK 4/2.5 400 mL/hr at 07/08/23 0953   prismasol BGK 4/2.5 1,500 mL/hr at 07/08/23 0953   vasopressin 0.03 Units/min (07/08/23 0844)   PRN Meds:.alteplase, docusate sodium, EPINEPHrine NaCl, heparin, HYDROmorphone (DILAUDID) injection, ipratropium-albuterol, [DISCONTINUED] ondansetron **OR** ondansetron (ZOFRAN) IV, sodium bicarbonate, sodium chloride  Current Labs: reviewed   Physical Exam:  Blood pressure (!) 95/58, pulse (!) 121, temperature 98.1 F (36.7 C), temperature  source Axillary, resp. rate (!) 22, height 6' (1.829 m), weight 80.3 kg, SpO2 99%. Encephalopathic, not answering questions Tachypneic Tachycardic, regular peripheral edema Left femoral temp HD cath with clean bandage NCAT Distended, mild grimace to palpation No rashes  A Dialysis dependent anuric AKI on CKD3b likely ischemic ATN, started HD 07/06/2023 using femoral temp HD catheter placed by surgery at [facility] Severe gallstone pancreatitis with transaminitis: Per critical care Worsening shock likely septic Anion gap metabolic acidosis/lactic acidosis Hyperkalemia resolved Alcoholic cirrhosis Hypertension, holding home medications Femoral HD catheter in small vein, retracted 3/8  P Initiate CRRT, all 4K.  No UF to start.  No anticoagulation to start. Appreciate assistance of CCM for catheter management Discussed with primary RN and CCM provider Medication Issues; Preferred narcotic agents for pain control are hydromorphone, fentanyl, and methadone. Morphine should not be used.  Baclofen should be avoided Avoid oral sodium phosphate and magnesium citrate based laxatives / bowel preps    Sabra Heck MD 07/08/2023, 10:30 AM  Recent Labs  Lab 07/07/23 0449 07/07/23 1643 07/07/23 1937 07/08/23 0122 07/08/23 0423 07/08/23 0816  NA 135 138 136 137 136  --   K 4.1 4.1 4.5 4.5 4.2  --   CL 100 95*  --   --  96*  --   CO2 17* 14*  --   --  15*  --   GLUCOSE 114* 459*  --   --  154*  --   BUN 24* 23*  --   --  26*  --   CREATININE  4.36* 3.85*  --   --  5.01*  --   CALCIUM 6.3* 5.7*  --   --  6.6*  --   PHOS  --   --   --   --   --  3.8   Recent Labs  Lab 07/06/23 2253 07/07/23 0449 07/07/23 1937 07/08/23 0122 07/08/23 0423  WBC 14.6* 15.9*  --   --  18.1*  HGB 9.6* 9.7* 11.2* 10.5* 9.1*  HCT 30.0* 30.0* 33.0* 31.0* 27.7*  MCV 92.6 91.7  --   --  90.5  PLT 145* 124*  --   --  132*

## 2023-07-08 NOTE — Progress Notes (Signed)
 Progressively worsening shock/ MODS today, now on 3 pressors despite CRRT (UF even) with progressive worsening of mental status requiring intubation for airway protection.  Sister updated on status prior to intubation of continued decline.  Family aware of ongoing poor prognosis, wishes to continue full aggressive care.         Posey Boyer, MSN, AG-ACNP-BC [facility] 07/08/2023, 5:37 PM  See Amion for pager If no response to pager , please call [phone] until 7pm After 7:00 pm call Elink  [phone]

## 2023-07-08 NOTE — Progress Notes (Signed)
 NAME:  [name] [name], MRN:  [id number], DOB:  Aug 08, 1965, LOS: 3 ADMISSION DATE:  07/05/2023, CONSULTATION DATE:  07/06/2023 REFERRING MD:  Randye Lobo, CHIEF COMPLAINT:  Abdominal Pain   History of Present Illness:  [name] [name] is a 58 year old gentleman with a known medical history significant for hypertension, COPD, alcoholic liver cirrhosis, hemosiderosis, CKD 3B, dyslipidemia, prior left lower extremity DVT presenting to [facility] health ED secondary to 3 to 4-day complaint of abdominal pain.  Patient describes bilateral flank type pain increasing in intensity without radiation to groin chest or back.  He states this is similar pain to his previous admission for pancreatitis.  Patient does elicit nausea and vomiting and diarrhea.   It is noted that the patient does have history of cholelithiasis and decompensated cirrhosis.  Patient reports he has not had any alcohol since his birthday on [birth year], however according to GI note he made comments that his last drink was on this past Sunday.  CT of the abdomen pelvis without contrast performed on July 05, 2023 reveals: IMPRESSION: 1. Pancreas is diffusely ill-defined through the tail region with subtle peripancreatic edema/inflammation. Imaging features compatible with acute pancreatitis. Pancreatic protocol CT recommended if renal function permits. If not, MRI of the abdomen with and without contrast recommended as pancreatic mass lesion cannot be excluded. 2. Hepatic cirrhosis with hepatic steatosis. 3. Cholelithiasis. 4. Left colonic diverticulosis without diverticulitis. 5. Atrophic left kidney.  Abdomen Limited ultrasound performed today 07/06/2023 reveals no significant abdominal ascites.  Laboratory indices: He does have elevation in coagulation studies with a PT of 26 on yesterday that elevated to 50.4 today 3 10/31/2023 and INR at admission yesterday 2.4 elevated to 5.5 today.  CBC: WBC 15.3, hemoglobin 11, hematocrit 36.7, platelets 164.   BMP sodium 132, potassium 5.8, chloride 99, CO2 14 BUN 40, creatinine 4.81 and glucose 214.  LFTs: Lipase 2929, AST 5021, ALT 05/08/2007.  Secondary to patient's known CKD presenting now with AKI and presence of oliguria a temporary HD line was placed by surgery team today for hemodialysis.  Patient underwent 2-hour HD in the ED prior to transfer.  Patient did receive IV fluids for pancreatitis management, as he is a poor candidate for liver transplant due to ongoing alcohol use per GI note.  Consults: Patient has been followed by nephrology and gastrointestinal services Blood cultures sent 07/06/2023 secondary to leukocytosis.  As such patient was transferred to [facility] for ICU monitoring and management and admitted with acute on chronic liver failure/pancreatitis.  Pertinent  Medical History  Asthma, B12 deficiency, cirrhosis, dyspnea, hyperlipidemia, history of kidney stones, hypertension, kidney stones, COPD, prior lower left extremity DVT, chronic anemia.  Significant Hospital Events: Including procedures, antibiotic start and stop dates in addition to other pertinent events   Hemodialysis 07/06/2023 (temporary HD line placed) s/p short HD, tx to [facility] 3/8 worsening shock, pressors, CTH/ CT a/p   Interim History / Subjective:  Worsening agitation overnight, ABG c/w metabolic acidosis, started on precedex, improved so stopped at shift change but now back trying to get OOB, confused/ agitated, restarting dex NE 38, vaso 0.03, remains on acetylcysteine, D10 Worsening renal labs, UOP overnight LFTs remain elevated Afebrile INR 5.3 Not felt clinically stable to go for MRCP overnight  Objective   Blood pressure (!) 95/58, pulse (!) 124, temperature 98.1 F (36.7 C), temperature source Axillary, resp. rate (!) 23, height 6' (1.829 m), weight 80.3 kg, SpO2 99%.        Intake/Output Summary (Last  24 hours) at 07/08/2023 0748 Last data filed at 07/08/2023 0700 Gross per 24 hour  Intake  3522.81 ml  Output 100 ml  Net 3422.81 ml   Filed Weights   07/06/23 2030 07/07/23 0122 07/08/23 0500  Weight: 75.1 kg 75.1 kg 80.3 kg    Examination: General:  AoC ill agitated and ill appearing older male, in restraints HEENT: MM pink/moist, poor dentition, faint JVD Neuro: agitated, repeats "let me up", speech remains slightly slurred, MAE CV: rr, ST 130-150s, left femoral trialysis- site with some prior oozing, R radial aline PULM:  tachypneic, clear, sats ok on HFNC GI: distended, no guarding, hypoBS, purwick with scant urine,  Extremities: warm/dry, no LE edema, slight edema in posterior L AC (has PIV in [facility]) Skin: no rashes   Labs> K 4.2, bicarb 15, BUN/ sCr 26/ 5, AG 25, Mag 1.5, albumin 2.3, iCa 89, AST/ ALT  3980/ 1636, 2731/ 1447, t. Bili 5.4> 6.6, WBC 15.9> 18.1, H/H  9.1/ 27, ptls 124> 134, INR > 10> 5.3 Lipase yest 1654  Resolved Hospital Problem list   N/A  Assessment & Plan:  Shock- septic 2/2 acute pancreatitis Acute pancreatitis, concern for gallstone pancreatitis Decompensated ETOH cirrhosis  Coagulapathy Hyperammonemia  Thrombocytopenia -Has history of hemosiderosis and follows hematology -No alcohol use noted since 2/8 according to patient but possible recent ETOH use - liver Doppler/ultrasound negative, no significant ascites - hepatitis panel neg P:  - cont NE and vaso for MAP goal > 65 - using L femoral HD cath for pressors, may need additional CVL - cont meropenem  - follow cultures/ fever curve/ WBC - appreciate GI assistance - remains unstable for MRCP to rule out biliary obstruction - cont acetylcysteine  - pain management- closely monitoring mental status/ airway protection - Vit K 10 x 1 - lactulose as able- PO vs PR - trend LFTs, coags - PPI - supportive care as below - overall poor prognosis, PMT consulted 3/8 to help with ongoing GOC - empiric thiamine/ folate   Recurrent hypoglycemia - secondary to liver dysfunction - cont to  titrate D10>using off aline given poor peripheral constriction/ variable readings  Acute encephalopathy, metabolic/ hepatic, also possible ETOH w/d - CTH neg 3/5 P: - could be headed toward intubation this am for airway protection if he does not improve with restarting precedex for RASS goal 0/-1 and pain control - lactulose as able - supportive care as abovee - neuro watch    AKI on CKD stage IIIb now nearly anuric Hyperkalemia, resolved Hypocalcemia Hypomag - Baseline creatinine near 2.0 and currently at 3.6 on admit, s/p 2hr of emergency dialysis 3/7 P:  - Nephrology following, appreciate input.  Suspect will need CRRT for cont metabolic acidosis  - renal indices BID - calcium gluconate 2gm again - bicarb per renal - left femoral trialysis line repositioned 3/8, monitor closely  - trend renal indices  - strict I/Os, daily wts, bladder scan q shift - avoid nephrotoxins, renal dose meds, hemodynamic support as above  COPD Hypoxia/ Atelectasis 2/2 abd process - airway watch for mental status, high risk for intubation - ABGs reassuring overnight, prn - keep NPO for now, suspect headed towards intubation - prn duonebs, cont dulera - HHFNC to minimize WOB w/ intra-abd process    Folate/B12/B6 deficiencies - Continue supplementation and follow-up with hematology   Prior history of left lower extremity DVT- ?2022 - holding asa and empiric heparin SQ given coagulapathy and thrombocytopenia   Dyslipidemia - Hold statin given  transaminitis   Hypertension - cont to hold pta while on vasopressors  Possible mediastinal mass - will need CT chest to further evaluate when stable  Protein calorie Malnutrition - if coags better, consider cortrak placement/ EN per RD if ok with GI  Best Practice (right click and "Reselect all SmartList Selections" daily)   Diet/type: NPO except meds DVT prophylaxis prophylactic heparin > hold Pressure ulcer(s): N/A GI prophylaxis: PPI Lines:  Dialysis Catheter and Arterial Line Foley:  N/A Code Status:  full code Last date of multidisciplinary goals of care discussion []   Sister NOK, pending update 3/9  Labs   CBC: Recent Labs  Lab 07/05/23 0615 07/06/23 0440 07/06/23 2253 07/07/23 0449 07/07/23 1937 07/08/23 0122 07/08/23 0423  WBC 8.0 15.3* 14.6* 15.9*  --   --  18.1*  HGB 14.1 11.0* 9.6* 9.7* 11.2* 10.5* 9.1*  HCT 44.1 36.7* 30.0* 30.0* 33.0* 31.0* 27.7*  MCV 91.5 96.6 92.6 91.7  --   --  90.5  PLT 225 164 145* 124*  --   --  132*    Basic Metabolic Panel: Recent Labs  Lab 07/06/23 0440 07/06/23 0921 07/06/23 1001 07/06/23 2253 07/07/23 0449 07/07/23 1643 07/07/23 1937 07/08/23 0122 07/08/23 0423  NA 133*  --  132* 135 135 138 136 137 136  K 6.7*   < > 5.8* 4.4 4.1 4.1 4.5 4.5 4.2  CL 103  --  99 100 100 95*  --   --  96*  CO2 14*  --  14* 16* 17* 14*  --   --  15*  GLUCOSE 21*  --  214* 83 114* 459*  --   --  154*  BUN 38*  --  40* 22* 24* 23*  --   --  26*  CREATININE 4.39*  --  4.81* 3.87* 4.36* 3.85*  --   --  5.01*  CALCIUM 7.1*  --  7.2* 6.8* 6.3* 5.7*  --   --  6.6*  MG 1.6*  --   --   --   --   --   --   --  1.5*   < > = values in this interval not displayed.   GFR: Estimated Creatinine Clearance: 17.6 mL/min (A) (by C-G formula based on SCr of 5.01 mg/dL (H)). Recent Labs  Lab 07/06/23 0440 07/06/23 2253 07/07/23 0449 07/07/23 0830 07/07/23 1145 07/08/23 0423  WBC 15.3* 14.6* 15.9*  --   --  18.1*  LATICACIDVEN  --   --   --  >9.0* >9.0*  --     Liver Function Tests: Recent Labs  Lab 07/05/23 0615 07/06/23 0440 07/07/23 0449 07/08/23 0423  AST 2,211* 5,021* 3,980* 2,731*  ALT 728* 1,709* 1,636* 1,447*  ALKPHOS 103 85 71 54  BILITOT 3.0* 3.7* 5.4* 6.6*  PROT 8.8* 7.1 5.8* 5.7*  ALBUMIN 2.8* 2.4* 2.3* 2.3*   Recent Labs  Lab 07/05/23 0615 07/06/23 0440 07/07/23 0449  LIPASE 843* 2,929* 1,654*   Recent Labs  Lab 07/07/23 0830  AMMONIA 60*    ABG     Component Value Date/Time   PHART 7.265 (L) 07/08/2023 0122   PCO2ART 35.8 07/08/2023 0122   PO2ART 60 (L) 07/08/2023 0122   HCO3 16.3 (L) 07/08/2023 0122   TCO2 17 (L) 07/08/2023 0122   ACIDBASEDEF 10.0 (H) 07/08/2023 0122   O2SAT 87 07/08/2023 0122     Coagulation Profile: Recent Labs  Lab 07/05/23 0750 07/06/23 0440 07/07/23 0830 07/07/23 1643 07/08/23 0423  INR 2.4* 5.5* 9.9* >10.0* 5.3*    Cardiac Enzymes: No results for input(s): "CKTOTAL", "CKMB", "CKMBINDEX", "TROPONINI" in the last 168 hours.  HbA1C: Hgb A1c MFr Bld  Date/Time Value Ref Range Status  05/27/2019 12:57 PM 5.7 (H) 4.8 - 5.6 % Final    Comment:    (NOTE) Pre diabetes:          5.7%-6.4% Diabetes:              >6.4% Glycemic control for   <7.0% adults with diabetes     CBG: Recent Labs  Lab 07/07/23 1552 07/07/23 1935 07/07/23 2313 07/08/23 0322 07/08/23 0513  GLUCAP 76 86 78 69* 114*    Allergies No Known Allergies   Home Medications  Prior to Admission medications   Medication Sig Start Date End Date Taking? Authorizing Provider  albuterol (PROVENTIL HFA) 108 (90 Base) MCG/ACT inhaler INHALE 2 PUFFS BY MOUTH EVERY 6 HOURS AS NEEDED FOR COUGHING, WHEEZING, OR SHORTNESS OF BREATH Patient taking differently: Inhale 2 puffs into the lungs every 6 (six) hours as needed for shortness of breath or wheezing. 02/10/21  Yes Shon Hale, MD  aspirin EC 81 MG tablet Take 81 mg by mouth daily.   Yes [provider]  atorvastatin (LIPITOR) 20 MG tablet Take 20 mg by mouth daily. 05/23/23  Yes [provider]  cyanocobalamin (VITAMIN B12) 1000 MCG/ML injection Inject 1,000 mcg into the muscle every 30 (thirty) days.   Yes [provider]  feeding supplement (ENSURE ENLIVE / ENSURE PLUS) LIQD Take 237 mLs by mouth 3 (three) times daily between meals. 03/14/23  Yes Shah, Pratik D, DO  furosemide (LASIX) 20 MG tablet TAKE 2 TABLETS BY MOUTH EVERY MORNING AND 1 TABLET IN  THE AFTERNOON Patient taking differently: Take 20 mg by mouth See admin instructions. Take 40 mg in the morning and 20 mg every afternoon 01/12/23  Yes Gelene Mink, NP  metoprolol tartrate (LOPRESSOR) 25 MG tablet Take 25 mg by mouth 2 (two) times daily. 05/24/21  Yes [provider]  mometasone-formoterol (DULERA) 200-5 MCG/ACT AERO Inhale 2 puffs into the lungs 2 (two) times daily. 02/10/21  Yes Emokpae, Courage, MD  ondansetron (ZOFRAN) 4 MG tablet Take 1 tablet (4 mg total) by mouth daily as needed for nausea or vomiting. 03/14/23 03/13/24 Yes Shah, Pratik D, DO  pantoprazole (PROTONIX) 40 MG tablet TAKE 1 TABLET(40 MG) BY MOUTH DAILY Patient taking differently: Take 40 mg by mouth daily. 09/18/22  Yes Gelene Mink, NP  sodium bicarbonate 650 MG tablet Take 1 tablet (650 mg total) by mouth 3 (three) times daily. 09/25/22  Yes Vassie Loll, MD  spironolactone (ALDACTONE) 50 MG tablet TAKE 2 TABLETS BY MOUTH EVERY MORNING AND 1 TABLET DAILY IN THE AFTERNOON Patient taking differently: Take 50 mg by mouth See admin instructions. Take 100 mg in the morning and 50 mg in the afternoon 12/28/22  Yes Gelene Mink, NP  tamsulosin (FLOMAX) 0.4 MG CAPS capsule Take 1 capsule (0.4 mg total) by mouth daily after supper. 05/10/23  Yes Gelene Mink, NP     CCT: 45 mins     Posey Boyer, MSN, AG-ACNP-BC [facility] 07/08/2023, 7:48 AM  See Amion for pager If no response to pager , please call [phone] until 7pm After 7:00 pm call Elink  [phone]

## 2023-07-08 NOTE — Progress Notes (Signed)
 Per verbal order, RRT placed pt on HHFNC.

## 2023-07-08 NOTE — Progress Notes (Signed)
 eLink Physician-Brief Progress Note Patient Name: [name] [name] DOB: May 30, 1965 MRN: [id number]   Date of Service  07/08/2023  HPI/Events of Note  Called as pt was extremely agitated, tachypneic, tachycardic He was trying to get up from bed, pulling at his lines and tubes.  Attempts to reorient him have been unsuccesful.   eICU Interventions  Pt had just received dilaudid.  ABG done - Still with persistent metabolic acidosis, pO2 60 One time haldol given.  Ordered restraints.  Few minutes after haldol, pt remains exrtermely agitated. Started precedex infusion. Will titrate accordingly.  Will continue to monitor closely.         Taheera Thomann M DELA CRUZ 07/08/2023, 1:19 AM

## 2023-07-09 ENCOUNTER — Inpatient Hospital Stay (HOSPITAL_COMMUNITY)

## 2023-07-09 DIAGNOSIS — R400 Somnolence: Secondary | ICD-10-CM

## 2023-07-09 DIAGNOSIS — K7030 Alcoholic cirrhosis of liver without ascites: Secondary | ICD-10-CM

## 2023-07-09 DIAGNOSIS — R748 Abnormal levels of other serum enzymes: Secondary | ICD-10-CM

## 2023-07-09 DIAGNOSIS — Z992 Dependence on renal dialysis: Secondary | ICD-10-CM

## 2023-07-09 DIAGNOSIS — A419 Sepsis, unspecified organism: Secondary | ICD-10-CM | POA: Diagnosis not present

## 2023-07-09 DIAGNOSIS — R578 Other shock: Secondary | ICD-10-CM | POA: Diagnosis not present

## 2023-07-09 DIAGNOSIS — K7200 Acute and subacute hepatic failure without coma: Secondary | ICD-10-CM

## 2023-07-09 DIAGNOSIS — D689 Coagulation defect, unspecified: Secondary | ICD-10-CM

## 2023-07-09 DIAGNOSIS — K852 Alcohol induced acute pancreatitis without necrosis or infection: Secondary | ICD-10-CM

## 2023-07-09 DIAGNOSIS — Z515 Encounter for palliative care: Secondary | ICD-10-CM | POA: Diagnosis not present

## 2023-07-09 DIAGNOSIS — K859 Acute pancreatitis without necrosis or infection, unspecified: Secondary | ICD-10-CM | POA: Diagnosis not present

## 2023-07-09 DIAGNOSIS — Z7189 Other specified counseling: Secondary | ICD-10-CM | POA: Diagnosis not present

## 2023-07-09 DIAGNOSIS — R1084 Generalized abdominal pain: Secondary | ICD-10-CM

## 2023-07-09 DIAGNOSIS — R6521 Severe sepsis with septic shock: Secondary | ICD-10-CM | POA: Diagnosis not present

## 2023-07-09 LAB — PHOSPHORUS
Phosphorus: 1.7 mg/dL — ABNORMAL LOW (ref 2.5–4.6)
Phosphorus: 3 mg/dL (ref 2.5–4.6)

## 2023-07-09 LAB — ECHOCARDIOGRAM COMPLETE
AR max vel: 2.25 cm2
AV Peak grad: 6.3 mmHg
Ao pk vel: 1.25 m/s
Area-P 1/2: 4.1 cm2
Est EF: 55
Height: 72 in
S' Lateral: 3.2 cm
Weight: 2832.47 [oz_av]

## 2023-07-09 LAB — COMPREHENSIVE METABOLIC PANEL
ALT: 920 U/L — ABNORMAL HIGH (ref 0–44)
AST: 1231 U/L — ABNORMAL HIGH (ref 15–41)
Albumin: 2.6 g/dL — ABNORMAL LOW (ref 3.5–5.0)
Alkaline Phosphatase: 48 U/L (ref 38–126)
Anion gap: 12 (ref 5–15)
BUN: 12 mg/dL (ref 6–20)
CO2: 23 mmol/L (ref 22–32)
Calcium: 7.4 mg/dL — ABNORMAL LOW (ref 8.9–10.3)
Chloride: 100 mmol/L (ref 98–111)
Creatinine, Ser: 2.38 mg/dL — ABNORMAL HIGH (ref 0.61–1.24)
GFR, Estimated: 31 mL/min — ABNORMAL LOW (ref >60)
Glucose, Bld: 139 mg/dL — ABNORMAL HIGH (ref 70–99)
Potassium: 3.5 mmol/L (ref 3.5–5.1)
Sodium: 135 mmol/L (ref 135–145)
Total Bilirubin: 7.7 mg/dL — ABNORMAL HIGH (ref 0.0–1.2)
Total Protein: 5.6 g/dL — ABNORMAL LOW (ref 6.5–8.1)

## 2023-07-09 LAB — RENAL FUNCTION PANEL
Albumin: 2.6 g/dL — ABNORMAL LOW (ref 3.5–5.0)
Albumin: 2.3 g/dL — ABNORMAL LOW (ref 3.5–5.0)
Anion gap: 14 (ref 5–15)
Anion gap: 11 (ref 5–15)
BUN: 12 mg/dL (ref 6–20)
BUN: 8 mg/dL (ref 6–20)
CO2: 22 mmol/L (ref 22–32)
CO2: 22 mmol/L (ref 22–32)
Calcium: 7.4 mg/dL — ABNORMAL LOW (ref 8.9–10.3)
Calcium: 7.3 mg/dL — ABNORMAL LOW (ref 8.9–10.3)
Chloride: 99 mmol/L (ref 98–111)
Chloride: 100 mmol/L (ref 98–111)
Creatinine, Ser: 2.34 mg/dL — ABNORMAL HIGH (ref 0.61–1.24)
Creatinine, Ser: 1.65 mg/dL — ABNORMAL HIGH (ref 0.61–1.24)
GFR, Estimated: 31 mL/min — ABNORMAL LOW (ref >60)
GFR, Estimated: 48 mL/min — ABNORMAL LOW (ref >60)
Glucose, Bld: 139 mg/dL — ABNORMAL HIGH (ref 70–99)
Glucose, Bld: 192 mg/dL — ABNORMAL HIGH (ref 70–99)
Phosphorus: 1.6 mg/dL — ABNORMAL LOW (ref 2.5–4.6)
Phosphorus: 3.8 mg/dL (ref 2.5–4.6)
Potassium: 3.4 mmol/L — ABNORMAL LOW (ref 3.5–5.1)
Potassium: 4.6 mmol/L (ref 3.5–5.1)
Sodium: 135 mmol/L (ref 135–145)
Sodium: 133 mmol/L — ABNORMAL LOW (ref 135–145)

## 2023-07-09 LAB — CBC
HCT: 25.4 % — ABNORMAL LOW (ref 39.0–52.0)
Hemoglobin: 8.6 g/dL — ABNORMAL LOW (ref 13.0–17.0)
MCH: 29.6 pg (ref 26.0–34.0)
MCHC: 33.9 g/dL (ref 30.0–36.0)
MCV: 87.3 fL (ref 80.0–100.0)
Platelets: 104 10*3/uL — ABNORMAL LOW (ref 150–400)
RBC: 2.91 MIL/uL — ABNORMAL LOW (ref 4.22–5.81)
RDW: 15.9 % — ABNORMAL HIGH (ref 11.5–15.5)
WBC: 13.5 10*3/uL — ABNORMAL HIGH (ref 4.0–10.5)
nRBC: 0.4 % — ABNORMAL HIGH (ref 0.0–0.2)

## 2023-07-09 LAB — LACTIC ACID, PLASMA
Lactic Acid, Venous: 3.4 mmol/L (ref 0.5–1.9)
Lactic Acid, Venous: 2.3 mmol/L (ref 0.5–1.9)

## 2023-07-09 LAB — GLUCOSE, CAPILLARY
Glucose-Capillary: 117 mg/dL — ABNORMAL HIGH (ref 70–99)
Glucose-Capillary: 132 mg/dL — ABNORMAL HIGH (ref 70–99)
Glucose-Capillary: 141 mg/dL — ABNORMAL HIGH (ref 70–99)
Glucose-Capillary: 141 mg/dL — ABNORMAL HIGH (ref 70–99)
Glucose-Capillary: 146 mg/dL — ABNORMAL HIGH (ref 70–99)
Glucose-Capillary: 147 mg/dL — ABNORMAL HIGH (ref 70–99)

## 2023-07-09 LAB — COOXEMETRY PANEL
Carboxyhemoglobin: 2.4 % — ABNORMAL HIGH (ref 0.5–1.5)
Methemoglobin: 0.7 % (ref 0.0–1.5)
O2 Saturation: 73.7 %
Total hemoglobin: 9.6 g/dL — ABNORMAL LOW (ref 12.0–16.0)

## 2023-07-09 LAB — PROTIME-INR
INR: 3.2 — ABNORMAL HIGH (ref 0.8–1.2)
Prothrombin Time: 33.3 s — ABNORMAL HIGH (ref 11.4–15.2)

## 2023-07-09 LAB — MAGNESIUM
Magnesium: 2.1 mg/dL (ref 1.7–2.4)
Magnesium: 2 mg/dL (ref 1.7–2.4)
Magnesium: 2.1 mg/dL (ref 1.7–2.4)

## 2023-07-09 MED ORDER — MIDAZOLAM HCL 2 MG/2ML IJ SOLN
2.0000 mg | Freq: Once | INTRAMUSCULAR | Status: AC
  Administered 2023-07-09: 2 mg via INTRAVENOUS

## 2023-07-09 MED ORDER — LACTULOSE 10 GM/15ML PO SOLN
10.0000 g | Freq: Two times a day (BID) | ORAL | Status: DC
  Administered 2023-07-09 – 2023-07-18 (×18): 10 g
  Filled 2023-07-09 (×18): qty 15

## 2023-07-09 MED ORDER — MIDAZOLAM HCL 2 MG/2ML IJ SOLN
INTRAMUSCULAR | Status: AC
  Filled 2023-07-09: qty 2

## 2023-07-09 MED ORDER — SODIUM PHOSPHATES 45 MMOLE/15ML IV SOLN
30.0000 mmol | Freq: Once | INTRAVENOUS | Status: AC
  Administered 2023-07-09: 30 mmol via INTRAVENOUS
  Filled 2023-07-09: qty 10

## 2023-07-09 MED ORDER — VITAMIN K1 10 MG/ML IJ SOLN
10.0000 mg | Freq: Once | INTRAVENOUS | Status: AC
  Administered 2023-07-09: 10 mg via INTRAVENOUS
  Filled 2023-07-09: qty 1

## 2023-07-09 MED ORDER — EPINEPHRINE 1 MG/10ML IJ SOSY
PREFILLED_SYRINGE | INTRAMUSCULAR | Status: AC
Start: 2023-07-09 — End: 2023-07-09
  Filled 2023-07-09: qty 10

## 2023-07-09 MED ORDER — FENTANYL CITRATE PF 50 MCG/ML IJ SOSY
25.0000 ug | PREFILLED_SYRINGE | INTRAMUSCULAR | Status: DC | PRN
  Administered 2023-07-10: 50 ug via INTRAVENOUS
  Administered 2023-07-10 (×3): 100 ug via INTRAVENOUS
  Administered 2023-07-10: 50 ug via INTRAVENOUS
  Administered 2023-07-11: 100 ug via INTRAVENOUS
  Filled 2023-07-09 (×4): qty 2
  Filled 2023-07-09: qty 1
  Filled 2023-07-09: qty 2

## 2023-07-09 MED ORDER — VITAL HIGH PROTEIN PO LIQD: 1000.0000 mL | ORAL | Status: DC

## 2023-07-09 MED ORDER — FENTANYL BOLUS VIA INFUSION
50.0000 ug | INTRAVENOUS | Status: DC | PRN
Start: 2023-07-09 — End: 2023-07-09

## 2023-07-09 MED ORDER — FENTANYL CITRATE PF 50 MCG/ML IJ SOSY: 25.0000 ug | PREFILLED_SYRINGE | INTRAMUSCULAR | Status: DC | PRN

## 2023-07-09 MED ORDER — HYDROCORTISONE SOD SUC (PF) 100 MG IJ SOLR
100.0000 mg | Freq: Two times a day (BID) | INTRAMUSCULAR | Status: DC
  Administered 2023-07-09 – 2023-07-10 (×4): 100 mg via INTRAVENOUS
  Filled 2023-07-09 (×4): qty 2

## 2023-07-09 MED ORDER — POTASSIUM CHLORIDE 10 MEQ/100ML IV SOLN
10.0000 meq | INTRAVENOUS | Status: AC
  Administered 2023-07-09 (×4): 10 meq via INTRAVENOUS
  Filled 2023-07-09 (×4): qty 100

## 2023-07-09 MED ORDER — METOCLOPRAMIDE HCL 5 MG/ML IJ SOLN
10.0000 mg | Freq: Once | INTRAMUSCULAR | Status: AC
  Administered 2023-07-09: 10 mg via INTRAVENOUS
  Filled 2023-07-09: qty 2

## 2023-07-09 MED ORDER — EPINEPHRINE 1 MG/10ML IJ SOSY: 1.0000 mg | PREFILLED_SYRINGE | Freq: Once | INTRAMUSCULAR | Status: DC | PRN

## 2023-07-09 NOTE — Procedures (Signed)
 Cortrak  Tube Type:  Cortrak - 55 inches Tube Location:  Right nare Initial Placement:  Stomach Secured by: Bridle Technique Used to Measure Tube Placement:  Marking at nare/corner of mouth Cortrak Secured At:  75 cm   Cortrak Tube Team Note:  Consult received to place a post pyloric Cortrak feeding tube. Feeding tube was unable to be placed past the pylorus after numerous attempts.   No x-ray is required. RN may begin using tube.   If the tube becomes dislodged please keep the tube and contact the Cortrak team at [email or web address] for replacement.  If after hours and replacement cannot be delayed, place a NG tube and confirm placement with an abdominal x-ray.    Betsey Holiday MS, RD, LDN If unable to be reached, please send secure chat to "RD inpatient" available from 8:00a-4:00p daily

## 2023-07-09 NOTE — Progress Notes (Addendum)
 NAME:  [name] [name], MRN:  [id number], DOB:  [birth year], LOS: 4 ADMISSION DATE:  07/05/2023, CONSULTATION DATE:  07/06/2023 REFERRING MD:  Randye Lobo, CHIEF COMPLAINT:  Abdominal Pain   History of Present Illness:  [name] [name] is a 58 year old gentleman with a known medical history significant for hypertension, COPD, alcoholic liver cirrhosis, hemosiderosis, CKD 3B, dyslipidemia, prior left lower extremity DVT presenting to [facility] health ED secondary to 3 to 4-day complaint of abdominal pain.  Patient describes bilateral flank type pain increasing in intensity without radiation to groin chest or back.  He states this is similar pain to his previous admission for pancreatitis.  Patient does elicit nausea and vomiting and diarrhea.   It is noted that the patient does have history of cholelithiasis and decompensated cirrhosis.  Patient reports he has not had any alcohol since his birthday on [birth year], however according to GI note he made comments that his last drink was on this past Sunday.  CT of the abdomen pelvis without contrast performed on July 05, 2023 reveals: IMPRESSION: 1. Pancreas is diffusely ill-defined through the tail region with subtle peripancreatic edema/inflammation. Imaging features compatible with acute pancreatitis. Pancreatic protocol CT recommended if renal function permits. If not, MRI of the abdomen with and without contrast recommended as pancreatic mass lesion cannot be excluded. 2. Hepatic cirrhosis with hepatic steatosis. 3. Cholelithiasis. 4. Left colonic diverticulosis without diverticulitis. 5. Atrophic left kidney.  Abdomen Limited ultrasound performed today 07/06/2023 reveals no significant abdominal ascites.  Laboratory indices: He does have elevation in coagulation studies with a PT of 26 on yesterday that elevated to 50.4 today 3 10/31/2023 and INR at admission yesterday 2.4 elevated to 5.5 today.  CBC: WBC 15.3, hemoglobin 11, hematocrit 36.7, platelets 164.   BMP sodium 132, potassium 5.8, chloride 99, CO2 14 BUN 40, creatinine 4.81 and glucose 214.  LFTs: Lipase 2929, AST 5021, ALT 05/08/2007.  Secondary to patient's known CKD presenting now with AKI and presence of oliguria a temporary HD line was placed by surgery team today for hemodialysis.  Patient underwent 2-hour HD in the ED prior to transfer.  Patient did receive IV fluids for pancreatitis management, as he is a poor candidate for liver transplant due to ongoing alcohol use per GI note.  Consults: Patient has been followed by nephrology and gastrointestinal services Blood cultures sent 07/06/2023 secondary to leukocytosis.  As such patient was transferred to [facility] for ICU monitoring and management and admitted with acute on chronic liver failure/pancreatitis.  Pertinent  Medical History  Asthma, B12 deficiency, cirrhosis, dyspnea, hyperlipidemia, history of kidney stones, hypertension, kidney stones, COPD, prior lower left extremity DVT, chronic anemia.  Significant Hospital Events: Including procedures, antibiotic start and stop dates in addition to other pertinent events   Hemodialysis 07/06/2023 (temporary HD line placed) s/p short HD, tx to [facility] 3/8 worsening shock, pressors, CTH/ CT a/p 3/9 intubated. On 3 pressors. Renal fxn and lactic acidosis worse. Started CRRT 3/10 LFTs a little better. Still on 3 pressors. Comfortable efforts on PSV but mental status not amendable to extubation    Interim History / Subjective:  Sedated on vent   Objective   Blood pressure (!) 127/52, pulse (!) 124, temperature (!) 97.3 F (36.3 C), temperature source Axillary, resp. rate 18, height 6' (1.829 m), weight 80.3 kg, SpO2 100%.    Vent Mode: PSV;CPAP FiO2 (%):  [40 %-100 %] 40 % Set Rate:  [18 bmp] 18 bmp Vt Set:  [433 mL] 620 mL  PEEP:  [5 cmH20] 5 cmH20 Pressure Support:  [10 cmH20] 10 cmH20 Plateau Pressure:  [19 cmH20-20 cmH20] 19 cmH20   Intake/Output Summary (Last 24 hours) at  07/09/2023 0934 Last data filed at 07/09/2023 3244 Gross per 24 hour  Intake 6174.46 ml  Output 5087 ml  Net 1087.46 ml   Filed Weights   07/06/23 2030 07/07/23 0122 07/08/23 0500  Weight: 75.1 kg 75.1 kg 80.3 kg    Examination: General this is a 58 year old male. He is sedated on full vent support HENT NCAT pinpoint pupils. Orally intubated  Pulm clear bilaterally excellent VTs on PSV 10 Portable chest x-ray personally reviewed showing endotracheal tube in satisfactory position.  Has right basilar airspace disease with probable element of effusion Card tachy rrr Abd soft + bowel sounds liq stools  Ext cool. Pulses palp no sig edema Neuro unresponsive. Pupils pinpoint   Resolved Hospital Problem list   N/A  Assessment & Plan:  Acute pancreatitis with septic shock and lactic acidosis followed by gastroenterology, they feel more likely acute alcohol related pancreatitis more than gallstones his biliary tree is not dilated -Lipase has been trending down -Remains on 3 pressors, lactate slowly improving, WBC improved  Plan Keep euvolemic Continue to wean norepinephrine for mean arterial pressure greater than 65 Continuing stress dose vasopressin Would benefit from CVP and SCVO2 monitoring F/u echo  Start to wean the epinephrine first Initiate stress dose steroids Follow-up pending cultures Day #3 meropenem, length of therapy still to be determined Could consider MRCP, but not stable enough  Decompensated ETOH cirrhosis, likely further complicated by shock liver with associated Coagulapathy & Thrombocytopenia -Has history of hemosiderosis and follows hematology.  Patient denied alcohol use since February 8 LFTs improving, thrombocytopenia fairly stable, remains coagulopathic with INR 3.2 Plan cont acetylcysteine  Continue to trend LFTs Continue to trend platelets and INR Repeat vitamin K today  Acute encephalopathy, metabolic/ hepatic, with hyperammonemia also possible  ETOH w/d CT head negative  plan Lactulose scheduled empiric thiamine/ folate  Ventilator management, right lower lobe atelectasis/volume loss with right pleural effusion  H/o COPD Intubated for airway protection in the context of worsening encephalopathy Arterial blood gas reviewed Plan Continuing full ventilator support PAD protocol VAP bundle RASS goal 0 to -1 A.m. chest x-ray Scheduled nebs  Recurrent hypoglycemia - secondary to liver dysfunction plan cont to titrate D10>using off aline given poor peripheral constriction/ variable readings Suspect once stress dose steroids started we can start to taper down   AKI on CKD stage IIIb now nearly anuric - Baseline creatinine near 2.0, 3.6 on admit, s/p 2hr of emergency dialysis 3/7, CRRT started on 3/9 Plan Renal dose medications CRRT per nephrology Serial labs Strict intake output  Fluid and electrolyte imbalance: hypokalemia, hypophosphatemia  Mild hyponatremia  Plan Replace and recheck   Folate/B12/B6 deficiencies Plan Continue supplementation and follow-up with hematology   Prior history of left lower extremity DVT- ?2022 plan holding asa and empiric heparin SQ given coagulapathy and thrombocytopenia   Dyslipidemia Plan Cont to Hold statin given transaminitis   Possible mediastinal mass plan will need CT chest to further evaluate when stable  Protein calorie Malnutrition Plan Cortrak post-pyloric  Start TFs  Best Practice (right click and "Reselect all SmartList Selections" daily)   Diet/type cortrak ordered 3/10 DVT prophylaxis prophylactic heparin > hold SCD Pressure ulcer(s): N/A GI prophylaxis: PPI Lines: Dialysis Catheter and Arterial Line Foley: needed  Code Status:  full code Last date of multidisciplinary goals of  care discussion []   Sister NOK, pending update 3/9   My cct 45 min

## 2023-07-09 NOTE — Progress Notes (Signed)
 eLink Physician-Brief Progress Note Patient Name: [name] [name] DOB: Mar 10, 1966 MRN: [id number]   Date of Service  07/09/2023  HPI/Events of Note  58 year old with a history of hypertension, COPD, cirrhosis and hemosiderosis with CKD presented with acute pancreatitis and septic shock in the setting of decompensated alcoholic cirrhosis.  Has been on a continuous D10 infusion for hypoglycemia.  Started on stress dose steroids today.    eICU Interventions  Reduce D10 infusion from 50 cc/h to 20 cc/h.     Intervention Category Intermediate Interventions: Hyperglycemia - evaluation and treatment  Aamirah Salmi 07/09/2023, 10:50 PM

## 2023-07-09 NOTE — Progress Notes (Signed)
 Initial Nutrition Assessment  DOCUMENTATION CODES:   Not applicable  INTERVENTION:  Once medically stable to initiate TF, recommend initiating trickle TF via Cortrak: Vital 1.5 at 56ml/hr   Once tolerance established and titration appropriate, recommend: Advancing Vital 1.5 by 10ml q8h to goal rate of 87ml/hr (1320 ml per day) 60ml Prosource TF20 QID TF at goal provides: 2300 kcal, 169g protein and 1008 ml free water  Renal MVI with minerals daily  NUTRITION DIAGNOSIS:   Increased nutrient needs related to acute illness as evidenced by estimated needs.  GOAL:   Patient will meet greater than or equal to 90% of their needs  MONITOR:   Labs, Vent status, Weight trends, I & O's  REASON FOR ASSESSMENT:   Consult Enteral/tube feeding initiation and management, Other (Comment) (Trickle TF; CRRT protocol)  ASSESSMENT:   Pt admitted with 3-4 days abdominal pain secondary to acute pancreatitis. PMH significant for HTN, COPD, alcoholic liver cirrhosis, hemosiderosis, CKD 3b, dyslipidemia, prior LLE DVT   3/7: TDC placement; s/p short HD session 3/8: worsening shock 3/9: CRRT started; intubated  Patient is currently intubated on ventilator support MV: 10.8 L/min Temp (24hrs), Avg:99 F (37.2 C), Min:97.3 F (36.3 C), Max:99.9 F (37.7 C)  Pt discussed in IDT rounds.  Remains on CRRT.  Cortrak in place. No TF today d/t medical instability.  Cortrak team unable to advance to post pyloric. If unable to tolerate gastric TF, may need to advance Cortrak tube in IR if necessary.   Drains/lines: UOP: x24 hours NGT to LIS: x24 hours CRRT: x24 hours  Admit weight: 73.9 kg Current weight: 80.3 kg   Medications: folvite, solu-cortef, lactulose BID, protonix, miralax daily, thiamine Drips: D10 @ 60ml/hr Epi stopped Levo @ 80mcg/min Vitamin K Potassium chloride Sodium phosphate Vaso @ 0.04 units/min  Labs:  Potassium 3.5, 3.4 Cr 2.38,  2.34 Corrected calcium 8.52 Phos 1.7, 1.6 AST 1231 ALT 920 GFR 31 Lactic acid 3.4 CBG's 107-146 x24 hours  NUTRITION - FOCUSED PHYSICAL EXAM: Deferred to follow up as NP present providing medical care.   Diet Order:   Diet Order             Diet NPO time specified Except for: Sips with Meds  Diet effective now                   EDUCATION NEEDS:   No education needs have been identified at this time  Skin:  Skin Assessment: Reviewed RN Assessment  Last BM:  x24 hours via FMS  Height:   Ht Readings from Last 1 Encounters:  07/06/23 6' (1.829 m)    Weight:   Wt Readings from Last 1 Encounters:  07/08/23 80.3 kg   BMI:  Body mass index is 24.01 kg/m.  Estimated Nutritional Needs:   Kcal:  2100-2300  Protein:  155-170g  Fluid:  1L + UOP  Drusilla Kanner, RDN, LDN Clinical Nutrition

## 2023-07-09 NOTE — Progress Notes (Signed)
 [name] NEPHROLOGY PROGRESS NOTE  Assessment/ Plan: Pt is a 58 y.o. 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   # Dialysis dependent anuric AKI on CKD3b likely ischemic ATN, started HD 07/06/2023 using femoral temp HD catheter placed by surgery at [facility]. -started CRRT on 07/08/2023.  All 4K bath, UF as tolerated.  # Septic shock: Currently on epinephrine, Levophed and vasopressin.  Antibiotics per primary team.  #Severe gallstone pancreatitis with transaminitis: Per critical care.  #Anion gap metabolic acidosis/lactic acidosis: Improving.  # Hyperkalemia resolved  Subjective: Seen and examined in ICU.  Tolerating CRRT well.  On multiple pressors including epinephrine, levo and vasopressin.  No significant urine output. Objective Vital signs in last 24 hours: Vitals:   07/09/23 0615 07/09/23 0630 07/09/23 0753 07/09/23 0805  BP:   (!) 127/52   Pulse: (!) 127 (!) 127 (!) 124   Resp: 19 20 18    Temp:    (!) 97.3 F (36.3 C)  TempSrc:    Axillary  SpO2: 100% 100%    Weight:      Height:       Weight change:   Intake/Output Summary (Last 24 hours) at 07/09/2023 0955 Last data filed at 07/09/2023 0900 Gross per 24 hour  Intake 6344.92 ml  Output 5325 ml  Net 1019.92 ml       Labs: RENAL PANEL Recent Labs  Lab 07/06/23 0440 07/06/23 0921 07/07/23 0449 07/07/23 1643 07/07/23 1937 07/08/23 0423 07/08/23 0816 07/08/23 1405 07/08/23 1520 07/08/23 1755 07/09/23 0307  NA 133*   < > 135 138   < > 136  --  138 138 139 135  135  K 6.7*   < > 4.1 4.1   < > 4.2  --  3.9 3.8 3.6 3.5  3.4*  CL 103   < > 100 95*  --  96*  --   --  98  --  100  99  CO2 14*   < > 17* 14*  --  15*  --   --  22  --  23  22  GLUCOSE 21*   < > 114* 459*  --  154*  --   --  127*  --  139*  139*  BUN 38*   < > 24* 23*  --  26*  --   --  22*  --  12   12  CREATININE 4.39*   < > 4.36* 3.85*  --  5.01*  --   --  3.71*  --  2.38*  2.34*  CALCIUM 7.1*   < > 6.3* 5.7*  --  6.6*  --   --  7.1*  --  7.4*  7.4*  MG 1.6*  --   --   --   --  1.5*  --   --  2.1  --  2.1  PHOS  --   --   --   --   --   --  3.8  --  2.2*  --  1.7*  1.6*  ALBUMIN 2.4*  --  2.3*  --   --  2.3*  --   --  2.4*  --  2.6*  2.6*   < > = values in this interval not displayed.    Liver Function Tests: Recent Labs  Lab 07/07/23 0449 07/08/23 0423 07/08/23 1520 07/09/23 0307  AST 3,980*  2,731*  --  1,231*  ALT 1,636* 1,447*  --  920*  ALKPHOS 71 54  --  48  BILITOT 5.4* 6.6*  --  7.7*  PROT 5.8* 5.7*  --  5.6*  ALBUMIN 2.3* 2.3* 2.4* 2.6*  2.6*   Recent Labs  Lab 07/06/23 0440 07/07/23 0449 07/08/23 0816  LIPASE 2,929* 1,654* 328*   Recent Labs  Lab 07/07/23 0830  AMMONIA 60*   CBC: Recent Labs    09/22/22 0926 09/22/22 0927 09/22/22 1003 09/29/22 1052 12/02/22 2225 03/19/23 1359 03/19/23 1400 03/26/23 0954 06/25/23 1008 07/05/23 0615 07/08/23 0122 07/08/23 0423 07/08/23 1405 07/08/23 1755 07/09/23 0307  HGB 12.9*  --    < >  --    < > 10.1*  --    < >  --    < > 10.5* 9.1* 9.5* 9.2* 8.6*  MCV 90.7  --    < >  --    < > 92.2  --    < >  --    < >  --  90.5  --   --  87.3  VITAMINB12  --  687  --   --   --   --  1,053*  --   --   --   --   --   --   --   --   FOLATE 13.2  --   --   --   --  6.3  --   --   --   --   --   --   --   --   --   FERRITIN  --  217  --  210  --   --  561*  --  218  --   --   --   --   --   --   TIBC  --  273  --  256  --   --  175*  --  224*  --   --   --   --   --   --   IRON  --  41*  --  61  --   --  27*  --  50  --   --   --   --   --   --    < > = values in this interval not displayed.    Cardiac Enzymes: No results for input(s): "CKTOTAL", "CKMB", "CKMBINDEX", "TROPONINI" in the last 168 hours. CBG: Recent Labs  Lab 07/08/23 1521 07/08/23 1921 07/08/23 2312 07/09/23 0257 07/09/23 0802  GLUCAP 115*  122* 122* 141* 146*    Iron Studies: No results for input(s): "IRON", "TIBC", "TRANSFERRIN", "FERRITIN" in the last 72 hours. Studies/Results: DG Chest Port 1 View Result Date: 07/09/2023 CLINICAL DATA:  Encounter for respiratory failure. EXAM: PORTABLE CHEST 1 VIEW COMPARISON:  07/08/2023 FINDINGS: ETT tip is stable above the carina. Enteric tube tip courses below the GE junction. Heart size and mediastinal contours are stable. Right pleural effusion is unchanged. Right-sided atelectasis is also noted. Visualized osseous structures are unremarkable. IMPRESSION: 1. Stable support apparatus. 2. Unchanged right pleural effusion and right-sided atelectasis. Electronically Signed   By: Signa Kell M.D.   On: 07/09/2023 08:56   DG Abd Portable 1V Result Date: 07/08/2023 CLINICAL DATA:  Orogastric tube EXAM: PORTABLE ABDOMEN - 1 VIEW COMPARISON:  Abdominal x-ray 06/2023 FINDINGS: Orogastric tube tip is in the mid body of the stomach. IMPRESSION: Orogastric tube tip is in the mid body of  the stomach. Electronically Signed   By: Darliss Cheney M.D.   On: 07/08/2023 18:09   DG CHEST PORT 1 VIEW Result Date: 07/08/2023 CLINICAL DATA:  Orogastric tube EXAM: PORTABLE CHEST 1 VIEW COMPARISON:  Chest x-ray 06/2023. FINDINGS: Endotracheal tube tip is 2.5 cm above the carina. Enteric tube tip is not seen, but the tube extends below the diaphragm. There is a new small right pleural effusion. There is soft tissue prominence of the right hilum, unchanged. Cardiac silhouette is enlarged, unchanged. No pneumothorax or acute fracture. IMPRESSION: 1. Endotracheal tube tip is 2.5 cm above the carina. 2. Enteric tube tip is not seen, but the tube extends below the diaphragm. 3. New small right pleural effusion. 4. Stable soft tissue prominence of the right hilum. Follow-up chest CT recommended. Electronically Signed   By: Darliss Cheney M.D.   On: 07/08/2023 18:09    Medications: Infusions:  acetylcysteine 6.25 mg/kg/hr  (07/09/23 0900)   dexmedetomidine (PRECEDEX) IV infusion 0.9 mcg/kg/hr (07/09/23 0900)   dextrose 60 mL/hr at 07/09/23 0900   epinephrine 10 mcg/min (07/09/23 0900)   fentaNYL infusion INTRAVENOUS 25 mcg/hr (07/09/23 0900)   meropenem (MERREM) IV Stopped (07/09/23 0534)   norepinephrine (LEVOPHED) Adult infusion 40 mcg/min (07/09/23 0900)   phenylephrine (NEO-SYNEPHRINE) Adult infusion     prismasol BGK 4/2.5 1,000 mL/hr at 07/09/23 0615   prismasol BGK 4/2.5 400 mL/hr at 07/08/23 0953   prismasol BGK 4/2.5 1,500 mL/hr at 07/09/23 1610   vasopressin 0.03 Units/min (07/09/23 0900)    Scheduled Medications:  arformoterol  15 mcg Nebulization BID   budesonide (PULMICORT) nebulizer solution  0.5 mg Nebulization BID   Chlorhexidine Gluconate Cloth  6 each Topical Q0600   folic acid  1 mg Intravenous Daily   lactulose  20 g Per Tube TID   Or   lactulose  300 mL Rectal TID   midodrine  15 mg Per Tube TID WC   mouth rinse  15 mL Mouth Rinse Q2H   pantoprazole (PROTONIX) IV  40 mg Intravenous Q24H   polyethylene glycol  17 g Per Tube Daily   thiamine (VITAMIN B1) injection  100 mg Intravenous Daily    have reviewed scheduled and prn medications.  Physical Exam: General: Ill looking male, intubated, sedated. Heart:RRR, s1s2 nl Lungs: Coarse breath sound bilateral Abdomen:soft, non-distended Extremities: Trace peripheral edema present Dialysis Access: temp HD line.   [name] 07/09/2023,9:55 AM  LOS: 4 days

## 2023-07-09 NOTE — Progress Notes (Signed)
 Attempted Echocardiogram, patient is currently in a procedure, will attempt later.

## 2023-07-09 NOTE — Procedures (Signed)
 Central Venous Catheter Insertion Procedure Note  [name]  [id number]  1965-10-28  Date:07/09/23  Time:12:07 PM   Provider Performing:Pete Bea Laura Tanja Port   Procedure: Insertion of Non-tunneled Central Venous [phone]) with US guidance ([id number])   Indication(s) Difficult access  Consent Risks of the procedure as well as the alternatives and risks of each were explained to the patient and/or caregiver.  Consent for the procedure was obtained and is signed in the bedside chart  Anesthesia Topical only with 1% lidocaine   Timeout Verified patient identification, verified procedure, site/side was marked, verified correct patient position, special equipment/implants available, medications/allergies/relevant history reviewed, required imaging and test results available.  Sterile Technique Maximal sterile technique including full sterile barrier drape, hand hygiene, sterile gown, sterile gloves, mask, hair covering, sterile ultrasound probe cover (if used).  Procedure Description Area of catheter insertion was cleaned with chlorhexidine and draped in sterile fashion.  With real-time ultrasound guidance a central venous catheter was placed into the right internal jugular vein. Nonpulsatile blood flow and easy flushing noted in all ports.  The catheter was sutured in place and sterile dressing applied.  Complications/Tolerance None; patient tolerated the procedure well. Chest X-ray is ordered to verify placement for internal jugular or subclavian cannulation.   Chest x-ray is not ordered for femoral cannulation.  EBL Minimal  Specimen(s) None

## 2023-07-09 NOTE — Progress Notes (Signed)
 Echocardiogram 2D Echocardiogram has been performed.  Lucendia Herrlich 07/09/2023, 2:37 PM

## 2023-07-09 NOTE — Progress Notes (Incomplete)
 NAME:  [name] MAESE, MRN:  [id number], DOB:  February 22, 1966, LOS: 4 ADMISSION DATE:  07/05/2023, CONSULTATION DATE:  07/06/2023 REFERRING MD:  Randye Lobo, CHIEF COMPLAINT:  Abdominal Pain   History of Present Illness:  Mr. Bua is a 58 year old gentleman with a known medical history significant for hypertension, COPD, alcoholic liver cirrhosis, hemosiderosis, CKD 3B, dyslipidemia, prior left lower extremity DVT presenting to [facility] health ED secondary to 3 to 4-day complaint of abdominal pain.  Patient describes bilateral flank type pain increasing in intensity without radiation to groin chest or back.  He states this is similar pain to his previous admission for pancreatitis.  Patient does elicit nausea and vomiting and diarrhea.   It is noted that the patient does have history of cholelithiasis and decompensated cirrhosis.  Patient reports he has not had any alcohol since his birthday on [birth year], however according to GI note he made comments that his last drink was on this past Sunday.  CT of the abdomen pelvis without contrast performed on July 05, 2023 reveals: IMPRESSION: 1. Pancreas is diffusely ill-defined through the tail region with subtle peripancreatic edema/inflammation. Imaging features compatible with acute pancreatitis. Pancreatic protocol CT recommended if renal function permits. If not, MRI of the abdomen with and without contrast recommended as pancreatic mass lesion cannot be excluded. 2. Hepatic cirrhosis with hepatic steatosis. 3. Cholelithiasis. 4. Left colonic diverticulosis without diverticulitis. 5. Atrophic left kidney.  Abdomen Limited ultrasound performed today 07/06/2023 reveals no significant abdominal ascites.  Laboratory indices: He does have elevation in coagulation studies with a PT of 26 on yesterday that elevated to 50.4 today 3 10/31/2023 and INR at admission yesterday 2.4 elevated to 5.5 today.  CBC: WBC 15.3, hemoglobin 11, hematocrit 36.7, platelets 164.   BMP sodium 132, potassium 5.8, chloride 99, CO2 14 BUN 40, creatinine 4.81 and glucose 214.  LFTs: Lipase 2929, AST 5021, ALT 05/08/2007.  Secondary to patient's known CKD presenting now with AKI and presence of oliguria a temporary HD line was placed by surgery team today for hemodialysis.  Patient underwent 2-hour HD in the ED prior to transfer.  Patient did receive IV fluids for pancreatitis management, as he is a poor candidate for liver transplant due to ongoing alcohol use per GI note.  Consults: Patient has been followed by nephrology and gastrointestinal services Blood cultures sent 07/06/2023 secondary to leukocytosis.  As such patient was transferred to [facility] for ICU monitoring and management and admitted with acute on chronic liver failure/pancreatitis.  Pertinent  Medical History  Asthma, B12 deficiency, cirrhosis, dyspnea, hyperlipidemia, history of kidney stones, hypertension, kidney stones, COPD, prior lower left extremity DVT, chronic anemia.  Significant Hospital Events: Including procedures, antibiotic start and stop dates in addition to other pertinent events   Hemodialysis 07/06/2023 (temporary HD line placed) s/p short HD, tx to [facility] 3/8 worsening shock, pressors, CTH/ CT a/p 3/9 intubated. On 3 pressors. Renal fxn and lactic acidosis worse. Started CRRT 3/10 LFTs a little better. Still on 3 pressors. Comfortable efforts on PSV but mental status not amendable to extubation. ECHO EF 55% no WMA, grade I diastolic dysfxn, started stress dose steroids. Right internal jugular CVL placed. The epi was weaned off over course of am after stress dose steroids started, NE weaning stopped fent gtt changed to PRN. D10 rate reduced to 10cc/hr. Acetylcysteine completed  3/11 vasopressin discontinued on very low-dose norepinephrine starting to wean sedation further to assess for readiness to wean ventilation   Interim History /  Subjective:   Heavily sedated this morning, triggered apnea  alarm when attempted SBT hemodynamics remarkably better Objective   Blood pressure (!) 131/57, pulse (!) 104, temperature 97.9 F (36.6 C), temperature source Axillary, resp. rate 20, height 6' (1.829 m), weight 80.3 kg, SpO2 100%. CVP:  [8 mmHg-11 mmHg] 9 mmHg  Vent Mode: PSV;CPAP FiO2 (%):  [40 %-60 %] 40 % Set Rate:  [18 bmp] 18 bmp Vt Set:  [620 mL] 620 mL PEEP:  [5 cmH20] 5 cmH20 Pressure Support:  [8 cmH20-10 cmH20] 8 cmH20 Plateau Pressure:  [19 cmH20-20 cmH20] 19 cmH20   Intake/Output Summary (Last 24 hours) at 07/09/2023 1832 Last data filed at 07/09/2023 1800 Gross per 24 hour  Intake 5090.98 ml  Output 5045 ml  Net 45.98 ml   Filed Weights   07/06/23 2030 07/07/23 0122 07/08/23 0500  Weight: 75.1 kg 75.1 kg 80.3 kg    Examination:  General this is a critically ill 58 year old male patient who remains on full ventilatory support this morning, hemodynamics have had significantly favorable response to the addition of stress dose steroids HEENT normocephalic atraumatic orally intubated pupils are pinpoint right IJ triple-lumen catheter is on remarkable Pulmonary: Clear to auscultation no accessory use, although did well on pressure support ventilation all day on the 10th, triggered apnea alarm this morning on exam Abdomen soft no grimacing to palpation bowel sounds are present Extremities are warm and dry with capillary refill that is brisk pulses are palpable Neuro: Currently heavily sedated, however does have episodes of fairly significant agitation depending on level of stimulus GU Foley catheter with minimal urine output  Resolved Hospital Problem list   N/A  Assessment & Plan:  Acute pancreatitis with septic shock and lactic acidosis followed by gastroenterology, they feel more likely acute alcohol related pancreatitis more than gallstones his biliary tree is not dilated -Lipase has been trending down 44 (normal) -weaned off epi and weaning norepi today down to 5  mcg/min after stress dose steroids started.  Plan Keep euvolemic Continue to wean norepinephrine for mean arterial pressure greater than 65 Dc vasopressin Cont stress dose steroids day 2 Cont to trend CVP  Follow-up pending cultures Day #4 meropenem, length of therapy still to be determined Could consider MRCP, but seems like getting better   Decompensated ETOH cirrhosis, likely further complicated by shock liver with associated Coagulapathy & Thrombocytopenia -Has history of hemosiderosis and follows hematology.  Patient denied alcohol use since February 8 LFTs improving, thrombocytopenia worse down to 49,  remains coagulopathic with INR 2.5 (that is down from 3.2) baseline from Feb AST 32 ALT 9 ALK PO4 94, nml total bili Peak AST 5021 Peak ALT 1709 Plan acetylcysteine off LFT <50% peaked level Continue to trend platelets and INR Repeat vitamin K again today  Holding AC repeat CBC in am Will review meds to look for additional culprit of thrombocytopenia  Acute encephalopathy, metabolic/ hepatic, with hyperammonemia also possible ETOH w/d CT head negative  plan Lactulose scheduled empiric thiamine/ folate Supportive care   Ventilator management, right lower lobe atelectasis/volume loss with right pleural effusion  H/o COPD Intubated for airway protection in the context of worsening encephalopathy Arterial blood gas reviewed Plan Continuing full ventilator support PAD protocol; rass goal 0 to -1, will reassess for pressure support ventilation and readiness for extubation daily starting today VAP bundle A.m. chest x-ray Scheduled nebs  Recurrent hypoglycemia - secondary to liver dysfunction, prob also further c/b adrenal dysfxn plan cont to titrate D10, decreased  to 20cc/hr Cont to taper to off as able   AKI on CKD stage IIIb now nearly anuric - Baseline creatinine near 2.0, 3.6 on admit, CRRT started 3/9 Plan Renal dose medications CRRT per nephrology Serial  labs Strict intake output  Fluid and electrolyte imbalance: hypokalemia, hypophosphatemia  Mild hyponatremia  Plan Replace and recheck   Anemia of critical illness Plan Trend Transfuse for hgb < 7 or hemodynamic instability w/ active bleeding  Folate/B12/B6 deficiencies Plan Continue supplementation and follow-up with hematology   Prior history of left lower extremity DVT- ?2022 plan holding asa and empiric heparin SQ given coagulapathy and thrombocytopenia   Dyslipidemia Plan Cont to Hold statin given transaminitis Repeat in am    Possible mediastinal mass plan will need CT chest to further evaluate when stable  Protein calorie Malnutrition Plan Cortrak placed 3/10 but not able to place post-pyloric Repeat abd film. Re-attempt 3/12 of not post-pyloric Would prefer to not start TFs until tube is post-pyloric    Best Practice (right click and "Reselect all SmartList Selections" daily)   Diet/type cortrak ordered 3/10 DVT prophylaxis prophylactic heparin > hold SCD Pressure ulcer(s): N/A GI prophylaxis: PPI Lines: Dialysis Catheter and Arterial Line CVL right internal jugular placed 3/10 Foley: needed  Code Status:  full code Last date of multidisciplinary goals of care discussion []   Sister updated 3/10   My cct 32 minutes

## 2023-07-09 NOTE — TOC Progression Note (Signed)
 Transition of Care  [facility]) - Progression Note    Patient Details  Name: [name] [name] MRN: [id number] Date of Birth: 1965-09-27  Transition of Care [facility]) CM/SW Contact  Tom-Johnson, Hershal Coria, RN Phone Number: 07/09/2023, 12:43 PM  Clinical Narrative:     Patient transferred to [facility] from [facility]. Nephrology following for AKI,  Patient underwent Lt Femoral Temporary Hemodialysis Catheter placement on 07/06/23 and started HD at [facility].  Currently intubated and sedated for Respiratory Failure. On IV abx.   Patient not Medically ready for discharge.  CM will continue to follow as patient progresses with care towards discharge.          Barriers to Discharge: Continued Medical Work up  Expected Discharge Plan and Services       Living arrangements for the past 2 months: Single Family Home                                       Social Determinants of Health (SDOH) Interventions SDOH Screenings   Food Insecurity: No Food Insecurity (07/06/2023)  Housing: Low Risk  (07/06/2023)  Transportation Needs: No Transportation Needs (07/06/2023)  Utilities: Not At Risk (07/06/2023)  Alcohol Screen: Low Risk  (09/13/2020)  Financial Resource Strain: High Risk (11/28/2021)   [facility]  Physical Activity: Sufficiently Active (09/13/2020)  Social Connections: Moderately Isolated (09/13/2020)  Stress: No Stress Concern Present (09/13/2020)  Tobacco Use: High Risk (07/05/2023)    Readmission Risk Interventions    03/13/2023    2:50 PM 05/22/2022    9:08 AM  Readmission Risk Prevention Plan  Transportation Screening Complete Complete  HRI or Home Care Consult Complete Complete  Social Work Consult for Recovery Care Planning/Counseling Complete Complete  Palliative Care Screening Not Applicable Not Applicable  Medication Review Oceanographer) Complete Complete

## 2023-07-09 NOTE — Consult Note (Cosign Needed Addendum)
 Palliative Care Consult Note                                  Date: 07/09/2023   Patient Name: [name] [name]  DOB: [birth year]  MRN: [id number]  Age / Sex: 58 y.o., male  PCP: Benetta Spar, MD Referring Physician: Cheri Fowler, MD  Reason for Consultation: Establishing goals of care  HPI/Patient Profile: 58 y.o. male  with past medical history of hypertension, COPD, alcoholic liver cirrhosis, hemosiderosis, CKD 3B, dyslipidemia, prior left lower extremity DVT presenting to [facility] health ED secondary to 3 to 4-day complaint of abdominal pain. He was admitted on 07/05/2023 with acute pancreatitis with septic shock and lactic acidosis, decompensated EtOH cirrhosis complicated by shock liver with associated coagulopathy and thrombocytopenia, acute encephalopathy multifactorial, and others.   He was transferred to [facility] for higher level of care.  He has since had a clinical decompensation.  See significant hospital events below.  Significant Hospital Events Hemodialysis 07/06/2023 (temporary HD line placed) s/p short HD, tx to [facility] 3/8 worsening shock, pressors, CTH/ CT a/p 3/9 intubated. On 3 pressors. Renal fxn and lactic acidosis worse. Started CRRT 3/10 LFTs a little better. Still on 3 pressors. Comfortable efforts on PSV but mental status not amendable to extubation.  Palliative medicine was consulted for GOC conversations.  Past Medical History:  Diagnosis Date   Asthma    B12 deficiency 03/15/2022   Cirrhosis (HCC)    Dyspnea    High cholesterol    History of kidney stones    Hypertension    Kidney stones     Subjective:   This NP Wynne Dust reviewed medical records, received report from team, assessed the patient and then meet at the patient's bedside to discuss diagnosis, prognosis, GOC, EOL wishes disposition and options.  I met with the patient at bedside, no family was present.  I attempted to call the  patient's family multiple times during today.  I called the patient's sister but it went right to voicemail and voicemail box was full.  I attempted to call the patient's aunt and arrange [place] x 2.   We meet to discuss diagnosis prognosis, GOC, EOL wishes, disposition and options. Concept of Palliative Care was introduced as specialized medical care for people and their families living with serious illness.  If focuses on providing relief from the symptoms and stress of a serious illness.  The goal is to improve quality of life for both the patient and the family. Values and goals of care important to patient and family were attempted to be elicited.  Created space and opportunity for patient  and family to explore thoughts and feelings regarding current medical situation   Natural trajectory and current clinical status were discussed. Questions and concerns addressed. Patient  encouraged to call with questions or concerns.    Patient/Family Understanding of Illness: Deferred  Life Review: Deferred  Goals: To be determined  Today's Discussion: Prior to seeing I did an extensive chart review including past GI notes, past provider notes.  I discussed the patient situation with his current primary GI in [place].  Prior to seeing the patient and calling family I also met with the patient's nurse.  We discussed clinical situation.  I also met with Dr. Merrily Pew with PCCM we discussed the severe clinical situation he is currently in.  Overall very high risk of mortality. I attempted to  call the patient's sister, but it went to voicemail and mailbox is full, unable to leave a message.  I attempted to call the patient's aunt, and it rang busy x 2.  Later in the day reach back out to the patient's nurse and expressed problems getting in touch with family.  The patient is unable to meaningfully communicate.  No family has been to the bedside yet today.  Having extensively reviewed clinical information I  shared that I would continue to try to reach family for goals of care discussion/family meeting.  Review of Systems  Unable to perform ROS: Intubated    Objective:   Primary Diagnoses: Present on Admission:  Acute pancreatitis  Shock liver  Acute biliary pancreatitis  Alcoholic cirrhosis (HCC)   Physical Exam Vitals and nursing note reviewed.  Constitutional:      General: He is not in acute distress.    Appearance: He is ill-appearing.     Interventions: He is sedated and intubated.  HENT:     Head: Normocephalic and atraumatic.  Cardiovascular:     Rate and Rhythm: Tachycardia present.  Pulmonary:     Effort: Pulmonary effort is normal. No respiratory distress. He is intubated.     Breath sounds: No wheezing or rhonchi.  Abdominal:     General: There is distension.     Palpations: Abdomen is soft.  Skin:    General: Skin is warm and dry.     Vital Signs:  BP (!) 127/52   Pulse (!) 124   Temp (!) 97.3 F (36.3 C) (Axillary)   Resp 18   Ht 6' (1.829 m)   Wt 80.3 kg   SpO2 100%   BMI 24.01 kg/m   Palliative Assessment/Data: 10%    Advanced Care Planning:   Existing Vynca/ACP Documentation: None  Primary Decision Maker: NEXT OF KIN  Code Status/Advance Care Planning: Full code  A discussion was had today regarding advanced directives. Concepts specific to code status, artifical feeding and hydration, continued IV antibiotics and rehospitalization was had.  The difference between a aggressive medical intervention path and a palliative comfort care path for this patient at this time was had.   Decisions/Changes to ACP: None today  Assessment & Plan:   Impression: 58 year old male with acute presentation chronic comorbidities as described above.  He is in a very severe clinical situation with fulminant hepatic failure.  Peak MELD of 48 (0.0% 90-day predicted survivability).  This has improved to meld of 39 (20% 90-day predicted survivability)  although this seems to be somewhat of an artificial improvement with CRRT and vitamin K administration.  Very high risk of mortality. Attempted x3 to call family (wants to sister and twice [place]) without success. Overall prognosis poor.  SUMMARY OF RECOMMENDATIONS   Full code Full scope of care Ongoing attempts to reach patient's family for GOC/family meeting Palliative medicine will continue to follow  Symptom Management:  Per primary team PMT is available to assist as needed  Prognosis:  Unable to determine  Discharge Planning:  To Be Determined   Discussed with: medical team, nursing team    Thank you for allowing [place] to participate in the care of [name] PMT will continue to support holistically.  Time Total: 77 min  Detailed review of medical records (labs, imaging, vital signs), medically appropriate exam, discussed with treatment team, counseling and education to patient, family, & staff, documenting clinical information, medication management, coordination of care  Signed by: Wynne Dust, NP Palliative  Medicine Team  Team Phone # [phone] (Nights/Weekends)  07/09/2023, 10:33 AM

## 2023-07-09 NOTE — Progress Notes (Signed)
 Wasted of fentanyl with Carlisle Beers, RN

## 2023-07-10 ENCOUNTER — Inpatient Hospital Stay (HOSPITAL_COMMUNITY)

## 2023-07-10 DIAGNOSIS — R6521 Severe sepsis with septic shock: Secondary | ICD-10-CM | POA: Diagnosis not present

## 2023-07-10 DIAGNOSIS — N1832 Chronic kidney disease, stage 3b: Secondary | ICD-10-CM

## 2023-07-10 DIAGNOSIS — N179 Acute kidney failure, unspecified: Secondary | ICD-10-CM | POA: Diagnosis not present

## 2023-07-10 DIAGNOSIS — Z515 Encounter for palliative care: Secondary | ICD-10-CM | POA: Diagnosis not present

## 2023-07-10 DIAGNOSIS — Z7189 Other specified counseling: Secondary | ICD-10-CM | POA: Diagnosis not present

## 2023-07-10 DIAGNOSIS — A419 Sepsis, unspecified organism: Secondary | ICD-10-CM | POA: Diagnosis not present

## 2023-07-10 DIAGNOSIS — J9601 Acute respiratory failure with hypoxia: Secondary | ICD-10-CM | POA: Diagnosis not present

## 2023-07-10 DIAGNOSIS — K859 Acute pancreatitis without necrosis or infection, unspecified: Secondary | ICD-10-CM | POA: Diagnosis not present

## 2023-07-10 LAB — GLUCOSE, CAPILLARY
Glucose-Capillary: 116 mg/dL — ABNORMAL HIGH (ref 70–99)
Glucose-Capillary: 114 mg/dL — ABNORMAL HIGH (ref 70–99)
Glucose-Capillary: 125 mg/dL — ABNORMAL HIGH (ref 70–99)
Glucose-Capillary: 128 mg/dL — ABNORMAL HIGH (ref 70–99)
Glucose-Capillary: 103 mg/dL — ABNORMAL HIGH (ref 70–99)
Glucose-Capillary: 108 mg/dL — ABNORMAL HIGH (ref 70–99)

## 2023-07-10 LAB — RENAL FUNCTION PANEL
Albumin: 2.3 g/dL — ABNORMAL LOW (ref 3.5–5.0)
Albumin: 2.2 g/dL — ABNORMAL LOW (ref 3.5–5.0)
Anion gap: 10 (ref 5–15)
Anion gap: 7 (ref 5–15)
BUN: 8 mg/dL (ref 6–20)
BUN: 10 mg/dL (ref 6–20)
CO2: 21 mmol/L — ABNORMAL LOW (ref 22–32)
CO2: 23 mmol/L (ref 22–32)
Calcium: 7.5 mg/dL — ABNORMAL LOW (ref 8.9–10.3)
Calcium: 7.5 mg/dL — ABNORMAL LOW (ref 8.9–10.3)
Chloride: 101 mmol/L (ref 98–111)
Chloride: 102 mmol/L (ref 98–111)
Creatinine, Ser: 1.4 mg/dL — ABNORMAL HIGH (ref 0.61–1.24)
Creatinine, Ser: 1.51 mg/dL — ABNORMAL HIGH (ref 0.61–1.24)
GFR, Estimated: 58 mL/min — ABNORMAL LOW (ref >60)
GFR, Estimated: 53 mL/min — ABNORMAL LOW (ref >60)
Glucose, Bld: 131 mg/dL — ABNORMAL HIGH (ref 70–99)
Glucose, Bld: 124 mg/dL — ABNORMAL HIGH (ref 70–99)
Phosphorus: 2.8 mg/dL (ref 2.5–4.6)
Phosphorus: 2.7 mg/dL (ref 2.5–4.6)
Potassium: 4.3 mmol/L (ref 3.5–5.1)
Potassium: 4.2 mmol/L (ref 3.5–5.1)
Sodium: 132 mmol/L — ABNORMAL LOW (ref 135–145)
Sodium: 132 mmol/L — ABNORMAL LOW (ref 135–145)

## 2023-07-10 LAB — IGG 4: IgG, Subclass 4: 15 mg/dL (ref 2–96)

## 2023-07-10 LAB — COMPREHENSIVE METABOLIC PANEL
ALT: 653 U/L — ABNORMAL HIGH (ref 0–44)
AST: 463 U/L — ABNORMAL HIGH (ref 15–41)
Albumin: 2.3 g/dL — ABNORMAL LOW (ref 3.5–5.0)
Alkaline Phosphatase: 62 U/L (ref 38–126)
Anion gap: 10 (ref 5–15)
BUN: 7 mg/dL (ref 6–20)
CO2: 21 mmol/L — ABNORMAL LOW (ref 22–32)
Calcium: 7.7 mg/dL — ABNORMAL LOW (ref 8.9–10.3)
Chloride: 103 mmol/L (ref 98–111)
Creatinine, Ser: 1.57 mg/dL — ABNORMAL HIGH (ref 0.61–1.24)
GFR, Estimated: 51 mL/min — ABNORMAL LOW (ref >60)
Glucose, Bld: 133 mg/dL — ABNORMAL HIGH (ref 70–99)
Potassium: 4.5 mmol/L (ref 3.5–5.1)
Sodium: 134 mmol/L — ABNORMAL LOW (ref 135–145)
Total Bilirubin: 8.1 mg/dL — ABNORMAL HIGH (ref 0.0–1.2)
Total Protein: 6.1 g/dL — ABNORMAL LOW (ref 6.5–8.1)

## 2023-07-10 LAB — CULTURE, RESPIRATORY W GRAM STAIN

## 2023-07-10 LAB — CBC
HCT: 28 % — ABNORMAL LOW (ref 39.0–52.0)
Hemoglobin: 9.6 g/dL — ABNORMAL LOW (ref 13.0–17.0)
MCH: 29.7 pg (ref 26.0–34.0)
MCHC: 34.3 g/dL (ref 30.0–36.0)
MCV: 86.7 fL (ref 80.0–100.0)
Platelets: 49 10*3/uL — ABNORMAL LOW (ref 150–400)
RBC: 3.23 MIL/uL — ABNORMAL LOW (ref 4.22–5.81)
RDW: 16.1 % — ABNORMAL HIGH (ref 11.5–15.5)
WBC: 19 10*3/uL — ABNORMAL HIGH (ref 4.0–10.5)
nRBC: 0.6 % — ABNORMAL HIGH (ref 0.0–0.2)

## 2023-07-10 LAB — MAGNESIUM
Magnesium: 2.2 mg/dL (ref 1.7–2.4)
Magnesium: 2.2 mg/dL (ref 1.7–2.4)
Magnesium: 2.4 mg/dL (ref 1.7–2.4)

## 2023-07-10 LAB — LIPASE, BLOOD: Lipase: 44 U/L (ref 11–51)

## 2023-07-10 LAB — PHOSPHORUS: Phosphorus: 2.5 mg/dL (ref 2.5–4.6)

## 2023-07-10 LAB — PROTIME-INR
INR: 2.5 — ABNORMAL HIGH (ref 0.8–1.2)
Prothrombin Time: 26.9 s — ABNORMAL HIGH (ref 11.4–15.2)

## 2023-07-10 MED ORDER — THIAMINE MONONITRATE 100 MG PO TABS
100.0000 mg | ORAL_TABLET | Freq: Every day | ORAL | Status: DC
Start: 2023-07-10 — End: 2023-07-22
  Administered 2023-07-10 – 2023-07-22 (×12): 100 mg
  Filled 2023-07-10 (×12): qty 1

## 2023-07-10 MED ORDER — VASOPRESSIN 20 UNITS/100 ML INFUSION FOR SHOCK
0.0000 [IU]/min | INTRAVENOUS | Status: DC
  Administered 2023-07-10: 0.03 [IU]/min via INTRAVENOUS
  Filled 2023-07-10: qty 100

## 2023-07-10 MED ORDER — PIPERACILLIN-TAZOBACTAM 3.375 G IVPB 30 MIN
3.3750 g | Freq: Four times a day (QID) | INTRAVENOUS | Status: DC
  Administered 2023-07-10 – 2023-07-11 (×2): 3.375 g via INTRAVENOUS
  Filled 2023-07-10 (×2): qty 50

## 2023-07-10 MED ORDER — FOLIC ACID 1 MG PO TABS
1.0000 mg | ORAL_TABLET | Freq: Every day | ORAL | Status: DC
  Administered 2023-07-10 – 2023-07-22 (×12): 1 mg
  Filled 2023-07-10 (×12): qty 1

## 2023-07-10 NOTE — Progress Notes (Signed)
 RT assisted with transport of this pt from 3M09 to CT and back while on full ventilatory support. Pt tolerated well with SVS.

## 2023-07-10 NOTE — Progress Notes (Signed)
 [facility] NEPHROLOGY PROGRESS NOTE  Assessment/ Plan: Pt is a 58 y.o. 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   # Dialysis dependent anuric AKI on CKD3b likely ischemic ATN, started HD 07/06/2023 using femoral temp HD catheter placed by surgery at [facility]. -started CRRT on 07/08/2023.  All 4K bath, UF as tolerated. Tolerating CRRT well, continue current prescription.  # Septic shock: Currently on epinephrine, Levophed and vasopressin.  Antibiotics per primary team.  #Severe gallstone pancreatitis with transaminitis: Per critical care.  #Anion gap metabolic acidosis/lactic acidosis: Improving.  # Hyperkalemia resolved  Subjective: Seen and examined in ICU.  Tolerating CRRT well, mostly running even.  No urine output.  Remains intubated and sedated..  Currently on Levophed and vasopressin.  Objective Vital signs in last 24 hours: Vitals:   07/10/23 0915 07/10/23 0930 07/10/23 0945 07/10/23 1125  BP:      Pulse:   76   Resp: 18 18 18    Temp:    (!) 97.4 F (36.3 C)  TempSrc:    Axillary  SpO2:   98%   Weight:      Height:       Weight change:   Intake/Output Summary (Last 24 hours) at 07/10/2023 1137 Last data filed at 07/10/2023 1100 Gross per 24 hour  Intake 2950.65 ml  Output 2974 ml  Net -23.35 ml       Labs: RENAL PANEL Recent Labs  Lab 07/08/23 1520 07/08/23 1755 07/09/23 0307 07/09/23 0800 07/09/23 1600 07/09/23 1735 07/10/23 0346 07/10/23 0944  NA 138 139 135  135  --  133*  --  134* 132*  K 3.8 3.6 3.5  3.4*  --  4.6  --  4.5 4.3  CL 98  --  100  99  --  100  --  103 101  CO2 22  --  23  22  --  22  --  21* 21*  GLUCOSE 127*  --  139*  139*  --  192*  --  133* 131*  BUN 22*  --  12  12  --  8  --  7 8  CREATININE 3.71*  --  2.38*  2.34*  --  1.65*  --  1.57* 1.40*  CALCIUM 7.1*  --  7.4*  7.4*  --   7.3*  --  7.7* 7.5*  MG 2.1  --  2.1 2.0  --  2.1 2.2 2.2  PHOS 2.2*  --  1.7*  1.6*  --  3.8 3.0 2.5 2.8  ALBUMIN 2.4*  --  2.6*  2.6*  --  2.3*  --  2.3* 2.3*    Liver Function Tests: Recent Labs  Lab 07/08/23 0423 07/08/23 1520 07/09/23 0307 07/09/23 1600 07/10/23 0346 07/10/23 0944  AST 2,731*  --  1,231*  --  463*  --   ALT 1,447*  --  920*  --  653*  --   ALKPHOS 54  --  48  --  62  --   BILITOT 6.6*  --  7.7*  --  8.1*  --   PROT 5.7*  --  5.6*  --  6.1*  --   ALBUMIN 2.3*   < > 2.6*  2.6* 2.3* 2.3* 2.3*   < > = values in this interval not displayed.   Recent Labs  Lab 07/07/23 0449 07/08/23 0816 07/10/23 0346  LIPASE  1,654* 328* 44   Recent Labs  Lab 07/07/23 0830  AMMONIA 60*   CBC: Recent Labs    09/22/22 0926 09/22/22 0927 09/22/22 1003 09/29/22 1052 12/02/22 2225 03/19/23 1359 03/19/23 1400 03/26/23 0954 06/25/23 1008 07/05/23 0615 07/08/23 0423 07/08/23 1405 07/08/23 1755 07/09/23 0307 07/10/23 0346  HGB 12.9*  --    < >  --    < > 10.1*  --    < >  --    < > 9.1* 9.5* 9.2* 8.6* 9.6*  MCV 90.7  --    < >  --    < > 92.2  --    < >  --    < > 90.5  --   --  87.3 86.7  VITAMINB12  --  687  --   --   --   --  1,053*  --   --   --   --   --   --   --   --   FOLATE 13.2  --   --   --   --  6.3  --   --   --   --   --   --   --   --   --   FERRITIN  --  217  --  210  --   --  561*  --  218  --   --   --   --   --   --   TIBC  --  273  --  256  --   --  175*  --  224*  --   --   --   --   --   --   IRON  --  41*  --  61  --   --  27*  --  50  --   --   --   --   --   --    < > = values in this interval not displayed.    Cardiac Enzymes: No results for input(s): "CKTOTAL", "CKMB", "CKMBINDEX", "TROPONINI" in the last 168 hours. CBG: Recent Labs  Lab 07/09/23 1610 07/09/23 1920 07/09/23 2350 07/10/23 0339 07/10/23 0733  GLUCAP 117* 141* 132* 116* 114*    Iron Studies: No results for input(s): "IRON", "TIBC", "TRANSFERRIN", "FERRITIN"  in the last 72 hours. Studies/Results: DG Abd Portable 1V Result Date: 07/09/2023 CLINICAL DATA:  Nasogastric tube placement. EXAM: PORTABLE ABDOMEN - 1 VIEW COMPARISON:  Radiograph yesterday. FINDINGS: Tip of the weighted enteric tube in the region of the distal stomach. The previous enteric tube has been removed. No bowel dilatation in the limited included upper abdomen. IMPRESSION: Tip of the weighted enteric tube in the region of the distal stomach. Electronically Signed   By: Narda Rutherford M.D.   On: 07/09/2023 23:13   DG Chest Port 1 View Result Date: 07/09/2023 CLINICAL DATA:  Central line placement EXAM: PORTABLE CHEST 1 VIEW COMPARISON:  07/09/2023, 07/08/2023, 07/07/2023, 03/11/2023 FINDINGS: Endotracheal tube tip is about 2.9 cm superior to carina. Enteric tube tip below the diaphragm but incompletely assessed. Right IJ central venous catheter tip near the cavoatrial region. No visible right pneumothorax. Low lung volumes with mild airspace disease at right base and small right effusion. Stable cardiomediastinal silhouette with vascular congestion and right hilar asymmetrical enlargement. IMPRESSION: 1. Right IJ central venous catheter tip near the cavoatrial region. No visible pneumothorax. 2. Low lung volumes with mild airspace disease at the right base and small  right effusion. 3. Similar right hilar asymmetrical opacity for which contrast-enhanced chest CT follow has been previously recommended Electronically Signed   By: Jasmine Pang M.D.   On: 07/09/2023 15:48   ECHOCARDIOGRAM COMPLETE Result Date: 07/09/2023    ECHOCARDIOGRAM REPORT   Patient Name:   MAURICO PERRELL Date of Exam: 07/09/2023 Medical Rec #:  [id number]    Height:       72.0 in Accession #:    [id number]   Weight:       177.0 lb Date of Birth:  1965-05-31     BSA:          2.023 m Patient Age:    58 years     BP:           127/52 mmHg Patient Gender: M            HR:           111 bpm. Exam Location:  Inpatient Procedure: 2D  Echo, Cardiac Doppler and Color Doppler (Both Spectral and Color            Flow Doppler were utilized during procedure). Indications:    Shock  History:        Patient has prior history of Echocardiogram examinations, most                 recent 08/06/2020. COPD, Signs/Symptoms:Hypotension; Risk                 Factors:Hypertension, Dyslipidemia and Current Smoker.  Sonographer:    Lucendia Herrlich RCS Referring Phys: [phone] PAULA B SIMPSON IMPRESSIONS  1. Left ventricular ejection fraction, by estimation, is 55%. The left ventricle has normal function. The left ventricle has no regional wall motion abnormalities. Left ventricular diastolic parameters are consistent with Grade I diastolic dysfunction (impaired relaxation).  2. Right ventricular systolic function is normal. The right ventricular size is normal. The estimated right ventricular systolic pressure is 25.6 mmHg.  3. The mitral valve is normal in structure. No evidence of mitral valve regurgitation. No evidence of mitral stenosis.  4. The aortic valve is tricuspid. There is mild calcification of the aortic valve. Aortic valve regurgitation is not visualized. No aortic stenosis is present.  5. The inferior vena cava is normal in size with <50% respiratory variability, suggesting right atrial pressure of 8 mmHg. FINDINGS  Left Ventricle: Left ventricular ejection fraction, by estimation, is 55%. The left ventricle has normal function. The left ventricle has no regional wall motion abnormalities. The left ventricular internal cavity size was normal in size. There is no left ventricular hypertrophy. Left ventricular diastolic parameters are consistent with Grade I diastolic dysfunction (impaired relaxation). Right Ventricle: The right ventricular size is normal. No increase in right ventricular wall thickness. Right ventricular systolic function is normal. The tricuspid regurgitant velocity is 2.10 m/s, and with an assumed right atrial pressure of 8 mmHg, the  estimated right ventricular systolic pressure is 25.6 mmHg. Left Atrium: Left atrial size was normal in size. Right Atrium: Right atrial size was normal in size. Pericardium: Trivial pericardial effusion is present. Mitral Valve: The mitral valve is normal in structure. No evidence of mitral valve regurgitation. No evidence of mitral valve stenosis. Tricuspid Valve: The tricuspid valve is normal in structure. Tricuspid valve regurgitation is trivial. Aortic Valve: The aortic valve is tricuspid. There is mild calcification of the aortic valve. Aortic valve regurgitation is not visualized. No aortic stenosis is present. Aortic valve peak gradient measures 6.2 mmHg.  Pulmonic Valve: The pulmonic valve was normal in structure. Pulmonic valve regurgitation is trivial. Aorta: The aortic root is normal in size and structure. Venous: The inferior vena cava is normal in size with less than 50% respiratory variability, suggesting right atrial pressure of 8 mmHg. IAS/Shunts: No atrial level shunt detected by color flow Doppler.  LEFT VENTRICLE PLAX 2D LVIDd:         4.60 cm   Diastology LVIDs:         3.20 cm   LV e' medial:    7.72 cm/s LV PW:         1.10 cm   LV E/e' medial:  6.2 LV IVS:        1.10 cm   LV e' lateral:   11.70 cm/s LVOT diam:     1.90 cm   LV E/e' lateral: 4.1 LV SV:         38 LV SV Index:   19 LVOT Area:     2.84 cm  RIGHT VENTRICLE             IVC RV S prime:     14.00 cm/s  IVC diam: 1.90 cm TAPSE (M-mode): 1.6 cm LEFT ATRIUM           Index        RIGHT ATRIUM           Index LA diam:      2.60 cm 1.29 cm/m   RA Area:     12.70 cm LA Vol (A2C): 29.3 ml 14.48 ml/m  RA Volume:   25.80 ml  12.75 ml/m LA Vol (A4C): 25.5 ml 12.61 ml/m  AORTIC VALVE AV Area (Vmax): 2.25 cm AV Vmax:        125.00 cm/s AV Peak Grad:   6.2 mmHg LVOT Vmax:      99.20 cm/s LVOT Vmean:     65.400 cm/s LVOT VTI:       0.134 m  AORTA Ao Root diam: 3.60 cm Ao Asc diam:  3.60 cm MITRAL VALVE               TRICUSPID VALVE MV Area  (PHT): 4.10 cm    TR Peak grad:   17.6 mmHg MV Decel Time: 185 msec    TR Vmax:        210.00 cm/s MV E velocity: 48.20 cm/s MV A velocity: 76.50 cm/s  SHUNTS MV E/A ratio:  0.63        Systemic VTI:  0.13 m                            Systemic Diam: 1.90 cm Dalton McleanMD Electronically signed by Wilfred Lacy Signature Date/Time: 07/09/2023/2:40:34 PM    Final    DG Chest Port 1 View Result Date: 07/09/2023 CLINICAL DATA:  Encounter for respiratory failure. EXAM: PORTABLE CHEST 1 VIEW COMPARISON:  07/08/2023 FINDINGS: ETT tip is stable above the carina. Enteric tube tip courses below the GE junction. Heart size and mediastinal contours are stable. Right pleural effusion is unchanged. Right-sided atelectasis is also noted. Visualized osseous structures are unremarkable. IMPRESSION: 1. Stable support apparatus. 2. Unchanged right pleural effusion and right-sided atelectasis. Electronically Signed   By: Signa Kell M.D.   On: 07/09/2023 08:56   DG Abd Portable 1V Result Date: 07/08/2023 CLINICAL DATA:  Orogastric tube EXAM: PORTABLE ABDOMEN - 1 VIEW COMPARISON:  Abdominal x-ray 06/2023 FINDINGS: Orogastric tube tip  is in the mid body of the stomach. IMPRESSION: Orogastric tube tip is in the mid body of the stomach. Electronically Signed   By: Darliss Cheney M.D.   On: 07/08/2023 18:09   DG CHEST PORT 1 VIEW Result Date: 07/08/2023 CLINICAL DATA:  Orogastric tube EXAM: PORTABLE CHEST 1 VIEW COMPARISON:  Chest x-ray 06/2023. FINDINGS: Endotracheal tube tip is 2.5 cm above the carina. Enteric tube tip is not seen, but the tube extends below the diaphragm. There is a new small right pleural effusion. There is soft tissue prominence of the right hilum, unchanged. Cardiac silhouette is enlarged, unchanged. No pneumothorax or acute fracture. IMPRESSION: 1. Endotracheal tube tip is 2.5 cm above the carina. 2. Enteric tube tip is not seen, but the tube extends below the diaphragm. 3. New small right pleural  effusion. 4. Stable soft tissue prominence of the right hilum. Follow-up chest CT recommended. Electronically Signed   By: Darliss Cheney M.D.   On: 07/08/2023 18:09    Medications: Infusions:  dexmedetomidine (PRECEDEX) IV infusion 0.6 mcg/kg/hr (07/10/23 1107)   dextrose 20 mL/hr at 07/10/23 1100   meropenem (MERREM) IV Stopped (07/10/23 0548)   norepinephrine (LEVOPHED) Adult infusion Stopped (07/10/23 1002)   prismasol BGK 4/2.5 1,000 mL/hr at 07/10/23 0840   prismasol BGK 4/2.5 400 mL/hr at 07/10/23 0112   prismasol BGK 4/2.5 1,500 mL/hr at 07/10/23 1025   vasopressin 0.03 Units/min (07/10/23 1100)    Scheduled Medications:  arformoterol  15 mcg Nebulization BID   budesonide (PULMICORT) nebulizer solution  0.5 mg Nebulization BID   Chlorhexidine Gluconate Cloth  6 each Topical Q0600   folic acid  1 mg Per Tube Daily   hydrocortisone sod succinate (SOLU-CORTEF) inj  100 mg Intravenous Q12H   lactulose  10 g Per Tube BID   midodrine  15 mg Per Tube TID WC   mouth rinse  15 mL Mouth Rinse Q2H   pantoprazole (PROTONIX) IV  40 mg Intravenous Q24H   thiamine  100 mg Per Tube Daily    have reviewed scheduled and prn medications.  Physical Exam: General: Ill looking male, intubated, sedated. Heart:RRR, s1s2 nl Lungs: Coarse breath sound bilateral Abdomen:soft, non-distended Extremities: Trace peripheral edema present Dialysis Access: temp HD line.   Rielly Corlett Prasad Shine Scrogham 07/10/2023,11:37 AM  LOS: 5 days

## 2023-07-10 NOTE — Progress Notes (Addendum)
 Daily Progress Note   Patient Name: [name] [name]       Date: 07/10/2023 DOB: 11/25/1965  Age: 58 y.o. MRN#: [id number] Attending Physician: Cheri Fowler, MD Primary Care Physician: Benetta Spar, MD Admit Date: 07/05/2023 Length of Stay: 5 days  Reason for Consultation/Follow-up: Establishing goals of care  HPI/Patient Profile:  58 y.o. male  with past medical history of hypertension, COPD, alcoholic liver cirrhosis, hemosiderosis, CKD 3B, dyslipidemia, prior left lower extremity DVT presenting to [facility] health ED secondary to 3 to 4-day complaint of abdominal pain. He was admitted on 07/05/2023 with acute pancreatitis with septic shock and lactic acidosis, decompensated EtOH cirrhosis complicated by shock liver with associated coagulopathy and thrombocytopenia, acute encephalopathy multifactorial, and others.    He was transferred to [facility] for higher level of care.  He has since had a clinical decompensation.  See significant hospital events below.   Significant Hospital Events Hemodialysis 07/06/2023 (temporary HD line placed) s/p short HD, tx to [facility] 3/8 worsening shock, pressors, CTH/ CT a/p 3/9 intubated. On 3 pressors. Renal fxn and lactic acidosis worse. Started CRRT 3/10 LFTs a little better. Still on 3 pressors. Comfortable efforts on PSV but mental status not amendable to extubation.   Palliative medicine was consulted for GOC conversations.  Subjective:   Subjective: Chart Reviewed. Updates received. Patient Assessed. Created space and opportunity for patient  and family to explore thoughts and feelings regarding current medical situation.  Today's Discussion: Today saw the patient at bedside, no family was present.  I had extensive discussion with the bedside nurse.  We discussed that the patient is now off 2 pressors (Levophed and epinephrine) but remains on vasopressin.  His liver labs seem to improve somewhat, although he did receive vitamin K yesterday as  well.  Calculated MELD score today improved from 39 yesterday to 37 today (23% 98 predicted survivability).  Overall in the big picture the patient remains seriously ill and prognosis remains very poor.  The bedside nurse states that the patient's aunt did call late yesterday and states that she anticipates the patient's sister will be by sometime tomorrow (today). I attempted to call the patient's sister which went to voicemail after several rings but voicemail box is full.  Sometime after that I also attempted to call the patient's aunt Kathie Rhodes and left a voicemail for call back.  We will continue attempts to reach family for goals of care discussions.  Later in the day I received a message from the nurse stating that the patient's sister called and spoke with Anders Simmonds, NP with PCCM.  She is apparently coming sometime tomorrow.  I immediately reached out to the sister and was able to reach her, she is actively driving here but will not arrive until after visiting hours.  I shared the role and purpose of palliative medicine as part of the healthcare team.  I asked if he knew when she would be here tomorrow so that palliative medicine can meet with her to have goals of care conversations.  She stated that she would be here at 10:00 and I shared that somebody from our team would be there to meet with her to discuss her brother's care.  She is in agreement.  Review of Systems  Unable to perform ROS   Objective:   Vital Signs:  BP (!) 92/50   Pulse 76   Temp (!) 97.1 F (36.2 C) (Axillary)   Resp 18   Ht 6' (1.829 m)  Wt 80.8 kg   SpO2 98%   BMI 24.16 kg/m   Physical Exam Vitals and nursing note reviewed.  Constitutional:      General: He is not in acute distress.    Appearance: He is ill-appearing.  HENT:     Head: Normocephalic and atraumatic.  Cardiovascular:     Rate and Rhythm: Normal rate.  Pulmonary:     Effort: Pulmonary effort is normal. No respiratory distress.     Breath  sounds: No wheezing or rhonchi.  Abdominal:     General: Bowel sounds are normal. There is distension.     Palpations: Abdomen is soft.  Skin:    General: Skin is warm and dry.  Neurological:     Mental Status: He is unresponsive.     Palliative Assessment/Data: 10%    Existing Vynca/ACP Documentation: None  Assessment & Plan:   Impression: Present on Admission:  Acute pancreatitis  Shock liver  Acute biliary pancreatitis  Alcoholic cirrhosis (HCC)  58 year old male with acute presentation chronic comorbidities as described above.  He is in a very severe clinical situation with fulminant hepatic failure.  Peak MELD of 48 (0.0% 90-day predicted survivability).  This has improved to meld of 39 (20% 90-day predicted survivability) although this seems to be somewhat of an artificial improvement with CRRT and vitamin K administration.  Very high risk of mortality. Attempted x3 to call family (wants to sister and twice Dyanna) without success. Overall prognosis poor.  SUMMARY OF RECOMMENDATIONS   Full code Full scope of care Continue attempts to reach family for GOC discussions Palliative medicine will continue to follow  Symptom Management:  Per primary team PMT is available to assist as needed  Code Status: Full code  Prognosis: Unable to determine  Discharge Planning: To Be Determined  Discussed with: Medical team, nursing team  Thank you for allowing [place] to participate in the care of [name] PMT will continue to support holistically.  Time Total: 53 min  Detailed review of medical records (labs, imaging, vital signs), medically appropriate exam, discussed with treatment team, counseling and education to patient, family, & staff, documenting clinical information, medication management, coordination of care  Wynne Dust, NP Palliative Medicine Team  Team Phone # [phone] (Nights/Weekends)  12/28/2020, 8:17 AM

## 2023-07-10 NOTE — Progress Notes (Addendum)
 Pt violently thrashing around in bed, yanking on wrist restraints, trying to get OOB, almost dislodging Arterial line and attempting to self extubate intermittently throughout shift. Multiple environmental changes, repositioning, emotional support, and music attempted without relief. Requiring PRN Fentanyl pushes with minimal relief for short periods of time. Pt has been MAE but is not following commands and is not redirectable. Ogan MD notified at this time.   Per Ogan MD, ok to restart Dex gtt to keep pt safe and comfortable.   Barbaraann Cao RN

## 2023-07-10 NOTE — Progress Notes (Addendum)
 No obvious bleed on CT  VS stable Still off vasopressin Weaning norepi  But still not waking up.  Plan Cont rx  Keep off Dex Continue supportive care If mental status does not improve over the next 48 hours for going to need to consider MRI\  Sister updated over phone She is planning on being here tomorrow (traveling here tonight)

## 2023-07-11 ENCOUNTER — Other Ambulatory Visit: Payer: Self-pay

## 2023-07-11 ENCOUNTER — Inpatient Hospital Stay (HOSPITAL_COMMUNITY)

## 2023-07-11 ENCOUNTER — Telehealth (INDEPENDENT_AMBULATORY_CARE_PROVIDER_SITE_OTHER): Payer: Self-pay | Admitting: *Deleted

## 2023-07-11 ENCOUNTER — Ambulatory Visit: Payer: Medicaid Other | Admitting: Diagnostic Neuroimaging

## 2023-07-11 DIAGNOSIS — Z515 Encounter for palliative care: Secondary | ICD-10-CM | POA: Diagnosis not present

## 2023-07-11 DIAGNOSIS — N179 Acute kidney failure, unspecified: Secondary | ICD-10-CM | POA: Diagnosis not present

## 2023-07-11 DIAGNOSIS — K7030 Alcoholic cirrhosis of liver without ascites: Secondary | ICD-10-CM | POA: Diagnosis not present

## 2023-07-11 DIAGNOSIS — K859 Acute pancreatitis without necrosis or infection, unspecified: Secondary | ICD-10-CM | POA: Diagnosis not present

## 2023-07-11 DIAGNOSIS — R6521 Severe sepsis with septic shock: Secondary | ICD-10-CM | POA: Diagnosis not present

## 2023-07-11 DIAGNOSIS — G9340 Encephalopathy, unspecified: Secondary | ICD-10-CM

## 2023-07-11 DIAGNOSIS — Z7189 Other specified counseling: Secondary | ICD-10-CM | POA: Diagnosis not present

## 2023-07-11 DIAGNOSIS — A419 Sepsis, unspecified organism: Secondary | ICD-10-CM | POA: Diagnosis not present

## 2023-07-11 DIAGNOSIS — J9601 Acute respiratory failure with hypoxia: Secondary | ICD-10-CM | POA: Diagnosis not present

## 2023-07-11 LAB — COMPREHENSIVE METABOLIC PANEL
ALT: 424 U/L — ABNORMAL HIGH (ref 0–44)
AST: 269 U/L — ABNORMAL HIGH (ref 15–41)
Albumin: 2.1 g/dL — ABNORMAL LOW (ref 3.5–5.0)
Alkaline Phosphatase: 72 U/L (ref 38–126)
Anion gap: 8 (ref 5–15)
BUN: 12 mg/dL (ref 6–20)
CO2: 24 mmol/L (ref 22–32)
Calcium: 7.6 mg/dL — ABNORMAL LOW (ref 8.9–10.3)
Chloride: 103 mmol/L (ref 98–111)
Creatinine, Ser: 1.35 mg/dL — ABNORMAL HIGH (ref 0.61–1.24)
GFR, Estimated: >60 mL/min (ref >60)
Glucose, Bld: 120 mg/dL — ABNORMAL HIGH (ref 70–99)
Potassium: 4 mmol/L (ref 3.5–5.1)
Sodium: 135 mmol/L (ref 135–145)
Total Bilirubin: 8.6 mg/dL — ABNORMAL HIGH (ref 0.0–1.2)
Total Protein: 6.3 g/dL — ABNORMAL LOW (ref 6.5–8.1)

## 2023-07-11 LAB — RENAL FUNCTION PANEL
Albumin: 2.1 g/dL — ABNORMAL LOW (ref 3.5–5.0)
Anion gap: 12 (ref 5–15)
BUN: 11 mg/dL (ref 6–20)
CO2: 21 mmol/L — ABNORMAL LOW (ref 22–32)
Calcium: 7.3 mg/dL — ABNORMAL LOW (ref 8.9–10.3)
Chloride: 100 mmol/L (ref 98–111)
Creatinine, Ser: 1.38 mg/dL — ABNORMAL HIGH (ref 0.61–1.24)
GFR, Estimated: 59 mL/min — ABNORMAL LOW (ref >60)
Glucose, Bld: 121 mg/dL — ABNORMAL HIGH (ref 70–99)
Phosphorus: 2.6 mg/dL (ref 2.5–4.6)
Potassium: 3.9 mmol/L (ref 3.5–5.1)
Sodium: 133 mmol/L — ABNORMAL LOW (ref 135–145)

## 2023-07-11 LAB — GLUCOSE, CAPILLARY
Glucose-Capillary: 102 mg/dL — ABNORMAL HIGH (ref 70–99)
Glucose-Capillary: 82 mg/dL (ref 70–99)
Glucose-Capillary: 101 mg/dL — ABNORMAL HIGH (ref 70–99)
Glucose-Capillary: 90 mg/dL (ref 70–99)
Glucose-Capillary: 109 mg/dL — ABNORMAL HIGH (ref 70–99)
Glucose-Capillary: 67 mg/dL — ABNORMAL LOW (ref 70–99)
Glucose-Capillary: 80 mg/dL (ref 70–99)

## 2023-07-11 LAB — CULTURE, BLOOD (ROUTINE X 2)
Culture: NO GROWTH
Culture: NO GROWTH
Special Requests: ADEQUATE

## 2023-07-11 LAB — CBC
HCT: 27.3 % — ABNORMAL LOW (ref 39.0–52.0)
Hemoglobin: 9.3 g/dL — ABNORMAL LOW (ref 13.0–17.0)
MCH: 29.6 pg (ref 26.0–34.0)
MCHC: 34.1 g/dL (ref 30.0–36.0)
MCV: 86.9 fL (ref 80.0–100.0)
Platelets: 41 10*3/uL — ABNORMAL LOW (ref 150–400)
RBC: 3.14 MIL/uL — ABNORMAL LOW (ref 4.22–5.81)
RDW: 16.1 % — ABNORMAL HIGH (ref 11.5–15.5)
WBC: 17 10*3/uL — ABNORMAL HIGH (ref 4.0–10.5)
nRBC: 0.6 % — ABNORMAL HIGH (ref 0.0–0.2)

## 2023-07-11 LAB — MAGNESIUM
Magnesium: 2.3 mg/dL (ref 1.7–2.4)
Magnesium: 2.4 mg/dL (ref 1.7–2.4)
Magnesium: 2.5 mg/dL — ABNORMAL HIGH (ref 1.7–2.4)

## 2023-07-11 LAB — PROTIME-INR
INR: 2 — ABNORMAL HIGH (ref 0.8–1.2)
Prothrombin Time: 23.1 s — ABNORMAL HIGH (ref 11.4–15.2)

## 2023-07-11 LAB — HEPATITIS B DNA, ULTRAQUANTITATIVE, PCR: HBV DNA SERPL PCR-LOG IU: UNDETERMINED {Log_IU}/mL

## 2023-07-11 LAB — PHOSPHORUS: Phosphorus: 3.1 mg/dL (ref 2.5–4.6)

## 2023-07-11 MED ORDER — HALOPERIDOL LACTATE 5 MG/ML IJ SOLN
1.0000 mg | Freq: Four times a day (QID) | INTRAMUSCULAR | Status: AC | PRN
  Administered 2023-07-12: 4 mg via INTRAVENOUS
  Filled 2023-07-11: qty 1

## 2023-07-11 MED ORDER — PROPOFOL 1000 MG/100ML IV EMUL
5.0000 ug/kg/min | INTRAVENOUS | Status: DC
  Administered 2023-07-11: 5 ug/kg/min via INTRAVENOUS
  Filled 2023-07-11: qty 100

## 2023-07-11 MED ORDER — ORAL CARE MOUTH RINSE: 15.0000 mL | OROMUCOSAL | Status: DC | PRN

## 2023-07-11 MED ORDER — PIPERACILLIN-TAZOBACTAM IN DEX 2-0.25 GM/50ML IV SOLN
2.2500 g | Freq: Three times a day (TID) | INTRAVENOUS | Status: DC
  Administered 2023-07-12 – 2023-07-13 (×4): 2.25 g via INTRAVENOUS
  Filled 2023-07-11 (×5): qty 50

## 2023-07-11 MED ORDER — DEXMEDETOMIDINE HCL IN NACL 400 MCG/100ML IV SOLN
0.0000 ug/kg/h | INTRAVENOUS | Status: DC
  Administered 2023-07-11: 0.4 ug/kg/h via INTRAVENOUS
  Administered 2023-07-12: 0.8 ug/kg/h via INTRAVENOUS
  Filled 2023-07-11: qty 100

## 2023-07-11 MED ORDER — HALOPERIDOL LACTATE 5 MG/ML IJ SOLN
INTRAMUSCULAR | Status: AC
  Administered 2023-07-11: 4 mg via INTRAVENOUS
  Filled 2023-07-11: qty 1

## 2023-07-11 MED ORDER — HYDROCORTISONE SOD SUC (PF) 100 MG IJ SOLR
100.0000 mg | Freq: Every day | INTRAMUSCULAR | Status: DC
  Administered 2023-07-11 – 2023-07-12 (×2): 100 mg via INTRAVENOUS
  Filled 2023-07-11 (×2): qty 2

## 2023-07-11 MED ORDER — MIDODRINE HCL 5 MG PO TABS
15.0000 mg | ORAL_TABLET | Freq: Three times a day (TID) | ORAL | Status: DC
  Administered 2023-07-11 (×2): 15 mg
  Filled 2023-07-11 (×4): qty 3

## 2023-07-11 MED ORDER — DEXMEDETOMIDINE HCL IN NACL 400 MCG/100ML IV SOLN
INTRAVENOUS | Status: AC
  Filled 2023-07-11: qty 100

## 2023-07-11 MED ORDER — VITAL 1.5 CAL PO LIQD
1000.0000 mL | ORAL | Status: DC
  Administered 2023-07-11: 1000 mL

## 2023-07-11 MED ORDER — PIPERACILLIN-TAZOBACTAM 3.375 G IVPB 30 MIN
3.3750 g | Freq: Four times a day (QID) | INTRAVENOUS | Status: AC
  Administered 2023-07-11 (×2): 3.375 g via INTRAVENOUS
  Filled 2023-07-11 (×2): qty 50

## 2023-07-11 MED ORDER — METOCLOPRAMIDE HCL 5 MG/ML IJ SOLN
10.0000 mg | Freq: Once | INTRAMUSCULAR | Status: AC
  Administered 2023-07-11: 10 mg via INTRAVENOUS
  Filled 2023-07-11: qty 2

## 2023-07-11 MED ORDER — ORAL CARE MOUTH RINSE
15.0000 mL | OROMUCOSAL | Status: DC
  Administered 2023-07-11 – 2023-07-24 (×45): 15 mL via OROMUCOSAL

## 2023-07-11 MED ORDER — DEXMEDETOMIDINE HCL IN NACL 400 MCG/100ML IV SOLN: 0.0000 ug/kg/h | INTRAVENOUS | Status: DC

## 2023-07-11 MED ORDER — PIPERACILLIN-TAZOBACTAM 3.375 G IVPB 30 MIN: 3.3750 g | Freq: Four times a day (QID) | INTRAVENOUS | Status: DC

## 2023-07-11 MED ORDER — HALOPERIDOL LACTATE 5 MG/ML IJ SOLN
INTRAMUSCULAR | Status: AC
  Filled 2023-07-11: qty 1

## 2023-07-11 NOTE — Procedures (Signed)
 Extubation Procedure Note  Patient Details:   Name: [name] [name] DOB: March 12, 1966 MRN: [id number]   Airway Documentation:    Vent end date: 07/11/23 Vent end time: 0828   Evaluation  O2 sats: stable throughout Complications: No apparent complications Patient did tolerate procedure well. Bilateral Breath Sounds: Clear, Diminished   No  Pt extubated to [facility] 2L, pt tolerating well at this time. Cuff leak present, no stridor noted.   Idelle Leech 07/11/2023, 8:28 AM

## 2023-07-11 NOTE — Progress Notes (Signed)
 Cortrak Tube Team Note:  Consult received to advance a Cortrak feeding tube. Unable to advance tube past 75cm in the left nare which is the original marking on the tube. When tube was advanced, met with resistance and unable to flush. Will obtain repeat imaging to evaluate placement  If the tube becomes dislodged please keep the tube and contact the Cortrak team at [email or web address] for replacement.  If after hours and replacement cannot be delayed, place a NG tube and confirm placement with an abdominal x-ray.    Greig Castilla, RD, LDN Registered Dietitian II Please reach out via secure chat Weekend on-call pager # available in [facility]

## 2023-07-11 NOTE — Progress Notes (Signed)
 Palliative Medicine Progress Note   Patient Name: [name] [name]       Date: 07/11/2023 DOB: [birth year]  Age: 58 y.o. MRN#: [id number] Attending Physician: Cheri Fowler, MD Primary Care Physician: Benetta Spar, MD Admit Date: 07/05/2023   HPI/Patient Profile: 58 y.o. male  with past medical history of hypertension, COPD, alcoholic liver cirrhosis, hemosiderosis, CKD 3B, dyslipidemia, prior left lower extremity DVT presenting to [facility] health ED secondary to 3 to 4-day complaint of abdominal pain. He was admitted on 07/05/2023 with acute pancreatitis with septic shock and lactic acidosis, decompensated EtOH cirrhosis complicated by shock liver with associated coagulopathy and thrombocytopenia, acute encephalopathy multifactorial, and others.    He was transferred to [facility] for higher level of care.  He has since had a clinical decompensation.  See significant hospital events below.   Significant Hospital Events Hemodialysis 07/06/2023 (temporary HD line placed) s/p short HD, tx to [facility] 3/8 worsening shock, pressors, CTH/ CT a/p 3/9 intubated. On 3 pressors. Renal fxn and lactic acidosis worse. Started CRRT 3/10 LFTs a little better. Still on 3 pressors. Comfortable efforts on PSV but mental status not amendable to extubation.   Palliative medicine was consulted for GOC conversations.   Subjective: Chart reviewed. Patient is now extubated. He is lethargic and remains confused.    Family Meeting: Met with patient's sister/[name] and aunt/[name] in the 8M family room.  Anders Simmonds NP was present to provide a medical update.   Patient's hospital course was reviewed in detail. Discussed his multiple acute issues including pancreatitis, decompensated cirrhosis, respiratory failure  with hypoxia, shock, coagulopathy and thrombocytopenia, and AKI.   Discussed that even though patient's condition has improved, he remains at high risk to decompensate secondary to his multiple comorbidities.   Discussed the "what ifs" and encouraged family to consider what medical interventions patient would or would not want in the event his condition were to deteriorate again, keeping in mind the concept of quality of life.   Discussed code status. Encouraged consideration of DNR/DNI status and provided education on evidence-based poor outcomes in similar hospitalized patients, as the cause of cardiac arrest would likely be associated with advanced illness rather than an acute reversible condition.  Family seems to indicate they would agree DNR status is appropriate, but are not ready to make that decision  today.  Overall, family understands the seriousness of patient's medical condition and his very guarded prognosis. They understand he is not a candidate for liver transplant due to ongoing alcohol use and also would not be a good candidate for long-term dialysis.   At this time, family agrees with continuing current full scope interventions and allowing time for outcomes. They are hopeful that patient's mental status will improve enough that he can participate in medical decision making.  Discussed the importance of continued conversation with the medical team regarding overall plan of care.   Life Review: [name] shares that patient has struggled with alcohol abuse since he was a teenager. She states he has "lived his life the way he wanted".  He is disabled due to a previous injury. He is not married, does not have children, and parents are deceased. [name] is his next of kin.  Patient lives with his significant other of many years. She has her own health issues, and would not be able to care for him at home.    Objective:  Physical Exam Vitals reviewed.  Constitutional:      General:  He is not in acute distress.    Appearance: He is ill-appearing.  Pulmonary:     Effort: No respiratory distress.  Skin:    General: Skin is warm and dry.  Neurological:     Mental Status: He is lethargic and confused.             Palliative Medicine Assessment & Plan   Assessment: Principal Problem:   Acute pancreatitis Active Problems:   Alcoholic cirrhosis (HCC)   Shock liver   Need for acute hemodialysis (HCC)   Acute biliary pancreatitis   Coagulopathy (HCC)   Somnolence   Septic shock (HCC)   Elevated liver enzymes  58 year old male critically ill with decompensated alcoholic liver cirrhosis, associated coagulopathy and thrombocytopenia, acute pancreatitis, shock requiring vasopressor support, acute respiratory failure, acute encephalopathy, and AKI on CKD.   Recommendations/Plan: Continue full scope care  Goal of care is medical stabilization Family is hopeful that patient's mental status will improve enough for him to participate in medical decision making Ongoing palliative support   Code Status: Full code with ongoing discussion   Prognosis:  Very guarded  Discharge Planning: To Be Determined   Thank you for allowing the Palliative Medicine Team to assist in the care of this patient.  Time: 65 minutes  Detailed review of medical records (labs, imaging, vital signs), medically appropriate exam, discussed with treatment team, counseling and education to family & staff, documenting clinical information., coordination of care.    Merry Proud, NP   Please contact Palliative Medicine Team phone at [phone] for questions and concerns.  For individual providers, please see AMION.

## 2023-07-11 NOTE — Telephone Encounter (Signed)
 Morrie Sheldon from [employer] called and states they did not have enough sample to run hbv pql and if you still need the order they need it put back in and patient called to go back to lab.

## 2023-07-11 NOTE — Progress Notes (Addendum)
 NAME:  [name] [name], MRN:  [id number], DOB:  1966/01/04, LOS: 6 ADMISSION DATE:  07/05/2023, CONSULTATION DATE:  07/06/2023 REFERRING MD:  Randye Lobo, CHIEF COMPLAINT:  Abdominal Pain   History of Present Illness:  [name] [name] is a 58 year old gentleman with a known medical history significant for hypertension, COPD, alcoholic liver cirrhosis, hemosiderosis, CKD 3B, dyslipidemia, prior left lower extremity DVT presenting to [facility] health ED secondary to 3 to 4-day complaint of abdominal pain.  Patient describes bilateral flank type pain increasing in intensity without radiation to groin chest or back.  He states this is similar pain to his previous admission for pancreatitis.  Patient does elicit nausea and vomiting and diarrhea.   It is noted that the patient does have history of cholelithiasis and decompensated cirrhosis.  Patient reports he has not had any alcohol since his birthday on [birth year], however according to GI note he made comments that his last drink was on this past Sunday.  CT of the abdomen pelvis without contrast performed on July 05, 2023 reveals: IMPRESSION: 1. Pancreas is diffusely ill-defined through the tail region with subtle peripancreatic edema/inflammation. Imaging features compatible with acute pancreatitis. Pancreatic protocol CT recommended if renal function permits. If not, MRI of the abdomen with and without contrast recommended as pancreatic mass lesion cannot be excluded. 2. Hepatic cirrhosis with hepatic steatosis. 3. Cholelithiasis. 4. Left colonic diverticulosis without diverticulitis. 5. Atrophic left kidney.  Abdomen Limited ultrasound performed today 07/06/2023 reveals no significant abdominal ascites.  Laboratory indices: He does have elevation in coagulation studies with a PT of 26 on yesterday that elevated to 50.4 today 3 10/31/2023 and INR at admission yesterday 2.4 elevated to 5.5 today.  CBC: WBC 15.3, hemoglobin 11, hematocrit 36.7, platelets 164.   BMP sodium 132, potassium 5.8, chloride 99, CO2 14 BUN 40, creatinine 4.81 and glucose 214.  LFTs: Lipase 2929, AST 5021, ALT 05/08/2007.  Secondary to patient's known CKD presenting now with AKI and presence of oliguria a temporary HD line was placed by surgery team today for hemodialysis.  Patient underwent 2-hour HD in the ED prior to transfer.  Patient did receive IV fluids for pancreatitis management, as he is a poor candidate for liver transplant due to ongoing alcohol use per GI note.  Consults: Patient has been followed by nephrology and gastrointestinal services Blood cultures sent 07/06/2023 secondary to leukocytosis.  As such patient was transferred to [facility] for ICU monitoring and management and admitted with acute on chronic liver failure/pancreatitis.  Pertinent  Medical History  Asthma, B12 deficiency, cirrhosis, dyspnea, hyperlipidemia, history of kidney stones, hypertension, kidney stones, COPD, prior lower left extremity DVT, chronic anemia.  Significant Hospital Events: Including procedures, antibiotic start and stop dates in addition to other pertinent events   Hemodialysis 07/06/2023 (temporary HD line placed) s/p short HD, tx to [facility] 3/8 worsening shock, pressors, CTH/ CT a/p 3/9 intubated. On 3 pressors. Renal fxn and lactic acidosis worse. Started CRRT 3/10 LFTs a little better. Still on 3 pressors. Comfortable efforts on PSV but mental status not amendable to extubation. ECHO EF 55% no WMA, grade I diastolic dysfxn, started stress dose steroids. Right internal jugular CVL placed. The epi was weaned off over course of am after stress dose steroids started, NE weaning stopped fent gtt changed to PRN. D10 rate reduced to 10cc/hr. Acetylcysteine completed  3/11 vasopressin discontinued on very low-dose norepinephrine starting to wean sedation further to assess for readiness to wean ventilation but not responding and appeared  to have left gaze pref after sedation off for  prolonged time. CT head obtained no acute findings. Abx switched to zosyn 3/12 woke up during evening. Agitated at times. Attempting to get OOB. Still seeming to have left sided weakness but exam much closer to that of the 10th. Norepi off. Abx stopped   Interim History / Subjective:  More awake. Off pressors.  Objective   Blood pressure 95/70, pulse 67, temperature 97.8 F (36.6 C), temperature source Oral, resp. rate 18, height 6' (1.829 m), weight 79.1 kg, SpO2 99%. CVP:  [0 mmHg-9 mmHg] 3 mmHg  Vent Mode: PRVC FiO2 (%):  [40 %] 40 % Set Rate:  [18 bmp] 18 bmp Vt Set:  [620 mL] 620 mL PEEP:  [5 cmH20] 5 cmH20 Plateau Pressure:  [17 cmH20-19 cmH20] 19 cmH20   Intake/Output Summary (Last 24 hours) at 07/11/2023 0701 Last data filed at 07/11/2023 0600 Gross per 24 hour  Intake 972.82 ml  Output 1720 ml  Net -747.18 ml   Filed Weights   07/08/23 0500 07/10/23 0500 07/11/23 0500  Weight: 80.3 kg 80.8 kg 79.1 kg    Examination:  General this is a 58 year old male. On initial exam this am he was heavily sedated on prop. This was turned off and he began to respond more once again w/ vigorous shaking of head and starting to move UEs (R>L) HENT NCAT orally intubated. Right internal jugular unremarkable  Pulm clear. Decreased bases. VTs 400-500s on PSV 5. PCXR Right effusion vol loss. Some mild patchy changes over all not much change  Card rrr Abd soft  Ext warm  Neuro. Agitated when stimulated. Aggressively shakes head back and forth. Not following commands as of yet but sedation just stopped. Still appears to have some left sided weakness  Gu minimal/ almost no UOP  Resolved Hospital Problem list   Lactic acid cleared   Assessment & Plan:  Acute alcoholic pancreatitis w/ circulatory (distributive)  shock c/b relative adrenal insuff  Treated as sepsis but think more just distributive shock from SIRS and pancreatitis  -Lipase has been trending down 44 (normal) -weaned off epi as  well as norepi since stress dose steroids started.  Plan Keep euvolemic Cont stress dose steroids day 3 MAP goal > 65 (keep NE on stand bye) Cont to trend CVP  Dc abx(cultures neg)  Start post-pyloric feeds if able He's > 9 liters positive seems to be tolerating pulling fluid w/ CRRT Would continue  Cont midodrine   Decompensated ETOH cirrhosis, likely further complicated by shock liver with associated Coagulapathy & Thrombocytopenia -Has history of hemosiderosis and follows hematology. LFTs Cont to improve. , thrombocytopenia about the same,  Coagulopathy improved;  INR 2.0 (that is down from 3.2) baseline from Feb AST 32 ALT 9 ALK PO4 94, nml total bili Peak AST 5021 Peak ALT 1709 (this continues to normalize) Plan Continue to trend platelets and INR  Holding AC repeat CBC in am  Acute encephalopathy, metabolic/ hepatic, with hyperammonemia also possible ETOH w/d Repeat CT head 3/11 for left gaze pref negative for acute changes plan Lactulose scheduled empiric thiamine/ folate Supportive care  We can hold off on MRI for now but if once more recovered still has left sided weakness we should consider   Ventilator management, right lower lobe atelectasis/volume loss with right pleural effusion  H/o COPD Intubated for airway protection in the context of worsening encephalopathy Plan PSV/SBT now.  RASS goal 0  VAP bundle Scheduled nebs Will  see how he does, possible extubation today vs return to rest/vent wean rotation   Recurrent hypoglycemia - secondary to liver dysfunction, prob also further c/b adrenal dysfxn plan cont to titrate D10, decreased to 20cc/hr; hope to start nutrition today and we can dc once that starts  AKI on CKD stage IIIb  - Baseline creatinine near 2.0, 3.6 on admit, CRRT started 3/9 Plan Renal dose medications CRRT per nephrology Serial labs Strict intake output  Fluid and electrolyte imbalance: intermittent Plan Replace and recheck as  needed  Anemia of critical illness Plan Trend Transfuse for hgb < 7 or hemodynamic instability w/ active bleeding  Folate/B12/B6 deficiencies Plan Continue supplementation and follow-up with hematology   Prior history of left lower extremity DVT- ?2022 plan holding asa and empiric heparin SQ given coagulapathy and thrombocytopenia   Dyslipidemia Plan Cont to Hold statin given transaminitis Repeat in am    Possible mediastinal mass plan will need CT chest to further evaluate when stable  Protein calorie Malnutrition Plan Will ask RD to attempt tube advancement today    Best Practice (right click and "Reselect all SmartList Selections" daily)   Diet/type cortrak ordered 3/10 DVT prophylaxis prophylactic heparin > hold SCD Pressure ulcer(s): N/A GI prophylaxis: PPI Lines: Dialysis Catheter and Arterial Line CVL right internal jugular placed 3/10 Foley: needed  Code Status:  full code Last date of multidisciplinary goals of care discussion []  There is GOC discussion today at 10 w/ PCM, CCM and family   Sister updated 3/11   My cct 32 min

## 2023-07-11 NOTE — Progress Notes (Deleted)
 Patient violently thrashing around in bed, yanking on wrist restraints, trying to get OOB, almost dislodging Arterial line and attempting to self extubate intermittently since taking over care at 1900. Multiple environmental changes, repositioning, emotional support, and music attempted without relief. Requiring PRN Fentanyl pushes with minimal relief for short periods of time. Pt has been MAE but is not following commands and is not redirectable. MD notified at this time.   Barbaraann Cao RN

## 2023-07-11 NOTE — Progress Notes (Signed)
 Nutrition Follow-up  DOCUMENTATION CODES:   Severe malnutrition in context of chronic illness  INTERVENTION:  Initiate trickle TF via Cortrak: Vital 1.5 at 77ml/hr    Once tolerance established and titration appropriate, recommend: Advancing Vital 1.5 by 10ml q8h to goal rate of 31ml/hr (1440 ml per day) 60ml Prosource TF20 once daily TF at goal provides: 2160 kcal, 117g protein and 1100 ml free water  Monitor magnesium and phosphorus every 12 hours x 4 occurrences, MD to replete as needed, as pt is at risk for refeeding syndrome given inadequate PO intake and severe malnutrition.  NUTRITION DIAGNOSIS:   Severe Malnutrition related to chronic illness (ETOH use, COPD, liver cirrhosis) as evidenced by severe fat depletion, severe muscle depletion. - diagnosis updated 3/12  GOAL:   Patient will meet greater than or equal to 90% of their needs - goal unmet, addressing via TF initiation  MONITOR:   Labs, Vent status, Weight trends, I & O's  REASON FOR ASSESSMENT:   Consult Enteral/tube feeding initiation and management, Other (Comment) (Trickle TF; CRRT protocol)  ASSESSMENT:   Pt admitted with 3-4 days abdominal pain secondary to acute pancreatitis. PMH significant for HTN, COPD, alcoholic liver cirrhosis, hemosiderosis, CKD 3b, dyslipidemia, prior LLE DVT  3/7: TDC placement; s/p short HD session 3/8: worsening shock 3/9: CRRT started; intubated 3/12:  extubated; CRRT discontinued; trickle TF via Cortrak  Extubated this morning. CRRT discontinued today.    Having cortrak advanced at time of visit to attempt post pyloric position. Per abdominal xray, cortrak tip is within distal stomach.   Abdomen distended and taut. Per flowsheet documentation, stool output via FMS appears to have decreased significantly within the last 2 days. Down from  x24 hours to over the last 24 hours. Pt also remains on lactulose BID. Pt also with history of cirrhosis of the liver.  Will monitor tolerance to trickle feeds  Admit weight: 73.9 kg Current weight: 79.1 kg  Drains/lines: CRRT- x24 hours  UOP- 28ml x24 hours  Medications: folvite, solu-cortef, lactulose BID, protonix, thiamine Drips: D10 @ 70ml/hr Abx  Labs: Sodium 133 Cr 1.38 Corrected calcium 8.82 Albumin 2.1 AST 269 ALT 424 GFR 59 CBG's 82-128 x24 hours  NUTRITION - FOCUSED PHYSICAL EXAM:  Flowsheet Row Most Recent Value  Orbital Region Severe depletion  Upper Arm Region Severe depletion  Thoracic and Lumbar Region Moderate depletion  Buccal Region Severe depletion  Temple Region Severe depletion  Clavicle Bone Region Severe depletion  Clavicle and Acromion Bone Region Severe depletion  Scapular Bone Region Severe depletion  Dorsal Hand Unable to assess  [in handmits]  Patellar Region Severe depletion  [UTA L knee stabilizer]  Anterior Thigh Region Severe depletion  Posterior Calf Region Severe depletion  Edema (RD Assessment) None  Hair Reviewed  Eyes Other (Comment)  [yellowing,  corneal arcus]  Mouth Other (Comment)  [poor dentition]  Skin Reviewed  Nails Unable to assess   Diet Order:   Diet Order             Diet NPO time specified  Diet effective now                   EDUCATION NEEDS:  No education needs have been identified at this time  Skin:  Skin Assessment: Reviewed RN Assessment  Last BM:  x24 hours via FMS  Height:   Ht Readings from Last 1 Encounters:  07/06/23 6' (1.829 m)    Weight:   Wt Readings  from Last 1 Encounters:  07/11/23 79.1 kg    BMI:  Body mass index is 23.65 kg/m.  Estimated Nutritional Needs:   Kcal:  2100-2300  Protein:  110-125g  Fluid:  1L + UOP  Drusilla Kanner, RDN, LDN Clinical Nutrition

## 2023-07-11 NOTE — TOC Progression Note (Signed)
 Transition of Care [facility]) - Progression Note    Patient Details  Name: [name] [name] MRN: [id number] Date of Birth: [birth year]  [facility]) CM/SW Contact  Tom-Johnson, Hershal Coria, RN Phone Number: 07/11/2023, 2:41 PM  Clinical Narrative:     Patient extubated today, on 2L O2.  CRRT was on 07/08/2023. CRRT discontinued today as Electrolytes and volume status significantly improved.  Patient's Las and urine output will be monitored daily for intermittent HD.  Nephrology following.   CM spoke with patient's sister Angelena Sole and aunt, Kathie Rhodes at bedside.   Patient is from home with his girlfriend. Does not have children, has two supportive siblings. Not employed, on disability. Does not drive, family transports and uses public transportation at times. Has a cane at home.  PCP is Fanta, Wayland Salinas, MD and uses [facility] on [address] in [place].  Patient not Medically ready for discharge.  CM will continue to follow as patient progresses with care towards discharge.          Barriers to Discharge: Continued Medical Work up  Expected Discharge Plan and Services       Living arrangements for the past 2 months: Single Family Home                                       Social Determinants of Health (SDOH) Interventions SDOH Screenings   Food Insecurity: No Food Insecurity (07/06/2023)  Housing: Low Risk  (07/06/2023)  Transportation Needs: No Transportation Needs (07/06/2023)  Utilities: Not At Risk (07/06/2023)  Alcohol Screen: Low Risk  (09/13/2020)  Financial Resource Strain: High Risk (11/28/2021)   [facility]  Physical Activity: Sufficiently Active (09/13/2020)  Social Connections: Moderately Isolated (09/13/2020)  Stress: No Stress Concern Present (09/13/2020)  Tobacco Use: High Risk (07/05/2023)    Readmission Risk Interventions    03/13/2023    2:50 PM 05/22/2022    9:08 AM  Readmission Risk Prevention  Plan  Transportation Screening Complete Complete  HRI or Home Care Consult Complete Complete  Social Work Consult for Recovery Care Planning/Counseling Complete Complete  Palliative Care Screening Not Applicable Not Applicable  Medication Review Oceanographer) Complete Complete

## 2023-07-11 NOTE — Plan of Care (Signed)
  Problem: Clinical Measurements: Goal: Will remain free from infection Outcome: Progressing Goal: Diagnostic test results will improve Outcome: Progressing Goal: Respiratory complications will improve Outcome: Progressing Goal: Cardiovascular complication will be avoided Outcome: Progressing   Problem: Activity: Goal: Risk for activity intolerance will decrease Outcome: Progressing   Problem: Pain Managment: Goal: General experience of comfort will improve and/or be controlled Outcome: Progressing   Problem: Skin Integrity: Goal: Risk for impaired skin integrity will decrease Outcome: Progressing   Problem: Education: Goal: Knowledge of General Education information will improve Description: Including pain rating scale, medication(s)/side effects and non-pharmacologic comfort measures Outcome: Not Progressing   Problem: Health Behavior/Discharge Planning: Goal: Ability to manage health-related needs will improve Outcome: Not Progressing   Problem: Clinical Measurements: Goal: Ability to maintain clinical measurements within normal limits will improve Outcome: Not Progressing   Problem: Nutrition: Goal: Adequate nutrition will be maintained Outcome: Not Progressing   Problem: Coping: Goal: Level of anxiety will decrease Outcome: Not Progressing   Problem: Elimination: Goal: Will not experience complications related to bowel motility Outcome: Not Progressing Goal: Will not experience complications related to urinary retention Outcome: Not Progressing   Problem: Safety: Goal: Ability to remain free from injury will improve Outcome: Not Progressing   Problem: Safety: Goal: Non-violent Restraint(s) Outcome: Not Progressing

## 2023-07-11 NOTE — Progress Notes (Signed)
 [name] NEPHROLOGY PROGRESS NOTE  Assessment/ Plan: Pt is a 58 y.o. 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   # Dialysis dependent anuric AKI on CKD3b likely ischemic ATN, started HD 07/06/2023 using femoral temp HD catheter placed by surgery at [facility]. -started CRRT on 07/08/2023.  All 4K bath, UF as tolerated. Patient is extubated this morning.  Electrolytes and volume status significantly improved.  We will discontinue CRRT today.  Monitor labs, urine output and daily assessment for intermittent HD.  Discussed with ICU team.  # Septic shock: Only on very low-dose of Levophed this morning, antibiotics per primary team.  Improving.    #Severe gallstone pancreatitis with transaminitis: Per critical care.  #Anion gap metabolic acidosis/lactic acidosis: Improving.  # Hyperkalemia resolved  Subjective: Seen and examined in ICU.  No issue overnight.  Extubated this morning.  No urine output.  Very small dose of Levophed.  Overall clinically improving.  No issue with CRRT overnight.  Objective Vital signs in last 24 hours: Vitals:   07/11/23 0930 07/11/23 0945 07/11/23 1000 07/11/23 1015  BP: (!) 83/49  (!) 94/57   Pulse: 87 96 95 91  Resp: 20 18 (!) 44 (!) 24  Temp:      TempSrc:      SpO2: 96% 94% 94% 97%  Weight:      Height:       Weight change: -1.7 kg  Intake/Output Summary (Last 24 hours) at 07/11/2023 1052 Last data filed at 07/11/2023 1000 Gross per 24 hour  Intake 866.19 ml  Output 1738 ml  Net -871.81 ml       Labs: RENAL PANEL Recent Labs  Lab 07/09/23 1735 07/10/23 0346 07/10/23 0944 07/10/23 1717 07/11/23 0346 07/11/23 0350 07/11/23 0744  NA  --  134* 132* 132* 135 133*  --   K  --  4.5 4.3 4.2 4.0 3.9  --   CL  --  103 101 102 103 100  --   CO2  --  21* 21* 23 24 21*  --   GLUCOSE  --  133* 131* 124*  120* 121*  --   BUN  --  7 8 10 12 11   --   CREATININE  --  1.57* 1.40* 1.51* 1.35* 1.38*  --   CALCIUM  --  7.7* 7.5* 7.5* 7.6* 7.3*  --   MG 2.1 2.2 2.2 2.4 2.3  --  2.4  PHOS 3.0 2.5 2.8 2.7  --  2.6  --   ALBUMIN  --  2.3* 2.3* 2.2* 2.1* 2.1*  --     Liver Function Tests: Recent Labs  Lab 07/09/23 0307 07/09/23 1600 07/10/23 0346 07/10/23 0944 07/10/23 1717 07/11/23 0346 07/11/23 0350  AST 1,231*  --  463*  --   --  269*  --   ALT 920*  --  653*  --   --  424*  --   ALKPHOS 48  --  62  --   --  72  --   BILITOT 7.7*  --  8.1*  --   --  8.6*  --   PROT 5.6*  --  6.1*  --   --  6.3*  --   ALBUMIN 2.6*  2.6*   < > 2.3*   < > 2.2* 2.1* 2.1*   < > = values in this interval not displayed.  Recent Labs  Lab 07/07/23 0449 07/08/23 0816 07/10/23 0346  LIPASE 1,654* 328* 44   Recent Labs  Lab 07/07/23 0830  AMMONIA 60*   CBC: Recent Labs    09/22/22 0926 09/22/22 0927 09/22/22 1003 09/29/22 1052 12/02/22 2225 03/19/23 1359 03/19/23 1400 03/26/23 0954 06/25/23 1008 07/05/23 0615 07/08/23 1405 07/08/23 1755 07/09/23 0307 07/10/23 0346 07/11/23 0346  HGB 12.9*  --    < >  --    < > 10.1*  --    < >  --    < > 9.5* 9.2* 8.6* 9.6* 9.3*  MCV 90.7  --    < >  --    < > 92.2  --    < >  --    < >  --   --  87.3 86.7 86.9  VITAMINB12  --  687  --   --   --   --  1,053*  --   --   --   --   --   --   --   --   FOLATE 13.2  --   --   --   --  6.3  --   --   --   --   --   --   --   --   --   FERRITIN  --  217  --  210  --   --  561*  --  218  --   --   --   --   --   --   TIBC  --  273  --  256  --   --  175*  --  224*  --   --   --   --   --   --   IRON  --  41*  --  61  --   --  27*  --  50  --   --   --   --   --   --    < > = values in this interval not displayed.    Cardiac Enzymes: No results for input(s): "CKTOTAL", "CKMB", "CKMBINDEX", "TROPONINI" in the last 168 hours. CBG: Recent Labs  Lab 07/10/23 1514 07/10/23 2013 07/10/23 2308 07/11/23 0323  07/11/23 0743  GLUCAP 128* 103* 108* 102* 82    Iron Studies: No results for input(s): "IRON", "TIBC", "TRANSFERRIN", "FERRITIN" in the last 72 hours. Studies/Results: DG Chest Port 1 View Result Date: 07/11/2023 CLINICAL DATA:  Respiratory failure EXAM: PORTABLE CHEST 1 VIEW COMPARISON:  07/09/2023 FINDINGS: Cardiac shadow is stable. Endotracheal tube, feeding catheter and right jugular central line are again seen and stable. Persistent right-sided pleural effusion is noted. Rounded fullness is again noted in the right perihilar region. No new focal abnormality is noted. IMPRESSION: Stable right-sided pleural effusion. Rounded soft tissue prominence in the right hilum. CT of the chest is again recommended when the patient's condition improves for further evaluation. Electronically Signed   By: Alcide Clever M.D.   On: 07/11/2023 10:32   CT HEAD WO CONTRAST ( ) Result Date: 07/10/2023 CLINICAL DATA:  58 year old male with neurologic deficit. EXAM: CT HEAD WITHOUT CONTRAST TECHNIQUE: Contiguous axial images were obtained from the base of the skull through the vertex without intravenous contrast. RADIATION DOSE REDUCTION: This exam was performed according to the departmental dose-optimization program which includes automated exposure control, adjustment of the mA and/or kV according to patient size and/or use of iterative reconstruction technique. COMPARISON:  Head CT 07/07/2023.  FINDINGS: Brain: No midline shift, ventriculomegaly, mass effect, evidence of mass lesion, intracranial hemorrhage or evidence of cortically based acute infarction. Stable gray-white matter differentiation throughout the brain. Bilateral cerebral hemisphere chronic small vessel disease appears stable. Vascular: No suspicious intracranial vascular hyperdensity. Calcified atherosclerosis at the skull base. Skull: Congenital incomplete ossification of the posterior C1 ring. No acute osseous abnormality identified. Sinuses/Orbits:  Left nasoenteric tube now in place. Visualized paranasal sinuses and mastoids are stable and well aerated. Other: No acute orbit or scalp soft tissue finding. IMPRESSION: 1. Continued stable noncontrast CT appearance of small vessel disease. No acute intracranial abnormality. 2. Left nasoenteric tube now in place. Electronically Signed   By: Odessa Fleming M.D.   On: 07/10/2023 16:23   DG Abd Portable 1V Result Date: 07/10/2023 CLINICAL DATA:  Tube placement EXAM: PORTABLE ABDOMEN - 1 VIEW COMPARISON:  X-ray 07/09/2023 FINDINGS: Limited x-ray for tube placement has feeding tube extending just to the right of the upper lumbar spine, likely along the distal stomach. There is gas in nondilated loops of bowel elsewhere in the visualized portions of the abdomen. The right hemi abdominal edge is clipped off the edge of the film. Presumed left femoral catheter in place at the edge of the imaging field. IMPRESSION: Limited x-ray for tube placement has feeding tube with tip overlying the distal stomach. Similar location to previous Electronically Signed   By: Karen Kays M.D.   On: 07/10/2023 13:17   DG Abd Portable 1V Result Date: 07/09/2023 CLINICAL DATA:  Nasogastric tube placement. EXAM: PORTABLE ABDOMEN - 1 VIEW COMPARISON:  Radiograph yesterday. FINDINGS: Tip of the weighted enteric tube in the region of the distal stomach. The previous enteric tube has been removed. No bowel dilatation in the limited included upper abdomen. IMPRESSION: Tip of the weighted enteric tube in the region of the distal stomach. Electronically Signed   By: Narda Rutherford M.D.   On: 07/09/2023 23:13   DG Chest Port 1 View Result Date: 07/09/2023 CLINICAL DATA:  Central line placement EXAM: PORTABLE CHEST 1 VIEW COMPARISON:  07/09/2023, 07/08/2023, 07/07/2023, 03/11/2023 FINDINGS: Endotracheal tube tip is about 2.9 cm superior to carina. Enteric tube tip below the diaphragm but incompletely assessed. Right IJ central venous catheter tip  near the cavoatrial region. No visible right pneumothorax. Low lung volumes with mild airspace disease at right base and small right effusion. Stable cardiomediastinal silhouette with vascular congestion and right hilar asymmetrical enlargement. IMPRESSION: 1. Right IJ central venous catheter tip near the cavoatrial region. No visible pneumothorax. 2. Low lung volumes with mild airspace disease at the right base and small right effusion. 3. Similar right hilar asymmetrical opacity for which contrast-enhanced chest CT follow has been previously recommended Electronically Signed   By: Jasmine Pang M.D.   On: 07/09/2023 15:48   ECHOCARDIOGRAM COMPLETE Result Date: 07/09/2023    ECHOCARDIOGRAM REPORT   Patient Name:   KYRELL RUACHO Date of Exam: 07/09/2023 Medical Rec #:  [id number]    Height:       72.0 in Accession #:    [id number]   Weight:       177.0 lb Date of Birth:  1965/06/27     BSA:          2.023 m Patient Age:    58 years     BP:           127/52 mmHg Patient Gender: M            HR:  111 bpm. Exam Location:  Inpatient Procedure: 2D Echo, Cardiac Doppler and Color Doppler (Both Spectral and Color            Flow Doppler were utilized during procedure). Indications:    Shock  History:        Patient has prior history of Echocardiogram examinations, most                 recent 08/06/2020. COPD, Signs/Symptoms:Hypotension; Risk                 Factors:Hypertension, Dyslipidemia and Current Smoker.  Sonographer:    Lucendia Herrlich RCS Referring Phys: [id number] PAULA B SIMPSON IMPRESSIONS  1. Left ventricular ejection fraction, by estimation, is 55%. The left ventricle has normal function. The left ventricle has no regional wall motion abnormalities. Left ventricular diastolic parameters are consistent with Grade I diastolic dysfunction (impaired relaxation).  2. Right ventricular systolic function is normal. The right ventricular size is normal. The estimated right ventricular systolic pressure is 25.6  mmHg.  3. The mitral valve is normal in structure. No evidence of mitral valve regurgitation. No evidence of mitral stenosis.  4. The aortic valve is tricuspid. There is mild calcification of the aortic valve. Aortic valve regurgitation is not visualized. No aortic stenosis is present.  5. The inferior vena cava is normal in size with <50% respiratory variability, suggesting right atrial pressure of 8 mmHg. FINDINGS  Left Ventricle: Left ventricular ejection fraction, by estimation, is 55%. The left ventricle has normal function. The left ventricle has no regional wall motion abnormalities. The left ventricular internal cavity size was normal in size. There is no left ventricular hypertrophy. Left ventricular diastolic parameters are consistent with Grade I diastolic dysfunction (impaired relaxation). Right Ventricle: The right ventricular size is normal. No increase in right ventricular wall thickness. Right ventricular systolic function is normal. The tricuspid regurgitant velocity is 2.10 m/s, and with an assumed right atrial pressure of 8 mmHg, the estimated right ventricular systolic pressure is 25.6 mmHg. Left Atrium: Left atrial size was normal in size. Right Atrium: Right atrial size was normal in size. Pericardium: Trivial pericardial effusion is present. Mitral Valve: The mitral valve is normal in structure. No evidence of mitral valve regurgitation. No evidence of mitral valve stenosis. Tricuspid Valve: The tricuspid valve is normal in structure. Tricuspid valve regurgitation is trivial. Aortic Valve: The aortic valve is tricuspid. There is mild calcification of the aortic valve. Aortic valve regurgitation is not visualized. No aortic stenosis is present. Aortic valve peak gradient measures 6.2 mmHg. Pulmonic Valve: The pulmonic valve was normal in structure. Pulmonic valve regurgitation is trivial. Aorta: The aortic root is normal in size and structure. Venous: The inferior vena cava is normal in size  with less than 50% respiratory variability, suggesting right atrial pressure of 8 mmHg. IAS/Shunts: No atrial level shunt detected by color flow Doppler.  LEFT VENTRICLE PLAX 2D LVIDd:         4.60 cm   Diastology LVIDs:         3.20 cm   LV e' medial:    7.72 cm/s LV PW:         1.10 cm   LV E/e' medial:  6.2 LV IVS:        1.10 cm   LV e' lateral:   11.70 cm/s LVOT diam:     1.90 cm   LV E/e' lateral: 4.1 LV SV:         38 LV  SV Index:   19 LVOT Area:     2.84 cm  RIGHT VENTRICLE             IVC RV S prime:     14.00 cm/s  IVC diam: 1.90 cm TAPSE (M-mode): 1.6 cm LEFT ATRIUM           Index        RIGHT ATRIUM           Index LA diam:      2.60 cm 1.29 cm/m   RA Area:     12.70 cm LA Vol (A2C): 29.3 ml 14.48 ml/m  RA Volume:   25.80 ml  12.75 ml/m LA Vol (A4C): 25.5 ml 12.61 ml/m  AORTIC VALVE AV Area (Vmax): 2.25 cm AV Vmax:        125.00 cm/s AV Peak Grad:   6.2 mmHg LVOT Vmax:      99.20 cm/s LVOT Vmean:     65.400 cm/s LVOT VTI:       0.134 m  AORTA Ao Root diam: 3.60 cm Ao Asc diam:  3.60 cm MITRAL VALVE               TRICUSPID VALVE MV Area (PHT): 4.10 cm    TR Peak grad:   17.6 mmHg MV Decel Time: 185 msec    TR Vmax:        210.00 cm/s MV E velocity: 48.20 cm/s MV A velocity: 76.50 cm/s  SHUNTS MV E/A ratio:  0.63        Systemic VTI:  0.13 m                            Systemic Diam: 1.90 cm Dalton McleanMD Electronically signed by Wilfred Lacy Signature Date/Time: 07/09/2023/2:40:34 PM    Final     Medications: Infusions:  dextrose 20 mL/hr at 07/11/23 1000   norepinephrine (LEVOPHED) Adult infusion Stopped (07/11/23 0137)   piperacillin-tazobactam     propofol (DIPRIVAN) infusion Stopped (07/11/23 0735)    Scheduled Medications:  arformoterol  15 mcg Nebulization BID   budesonide (PULMICORT) nebulizer solution  0.5 mg Nebulization BID   Chlorhexidine Gluconate Cloth  6 each Topical Q0600   folic acid  1 mg Per Tube Daily   hydrocortisone sod succinate (SOLU-CORTEF) inj  100 mg  Intravenous Daily   lactulose  10 g Per Tube BID   midodrine  15 mg Per Tube Q8H   mouth rinse  15 mL Mouth Rinse Q2H   pantoprazole (PROTONIX) IV  40 mg Intravenous Q24H   thiamine  100 mg Per Tube Daily    have reviewed scheduled and prn medications.  Physical Exam: General: Extubated and on 2 L of oxygen. Heart:RRR, s1s2 nl Lungs: Coarse breath sound bilateral Abdomen:soft, non-distended Extremities: Trace peripheral edema present Dialysis Access: temp HD line.   Hyder Deman Prasad Jase Reep 07/11/2023,10:52 AM  LOS: 6 days

## 2023-07-11 NOTE — Progress Notes (Signed)
 F/VT reassuring.  No accessory use.  He is at point that we either re-sedate him or give him attempt off vent. I suspect the delirium to last for some time so think worth trial of extubation

## 2023-07-11 NOTE — Telephone Encounter (Signed)
 No need to resend this, thanks

## 2023-07-11 NOTE — Progress Notes (Signed)
 Pt agitated, still not following commands. Tachycardic, tachypneic, hypertensive, restless, moaning. Dr. Warrick Parisian on camera, orders given.

## 2023-07-12 DIAGNOSIS — Z515 Encounter for palliative care: Secondary | ICD-10-CM | POA: Diagnosis not present

## 2023-07-12 DIAGNOSIS — Z7189 Other specified counseling: Secondary | ICD-10-CM | POA: Diagnosis not present

## 2023-07-12 DIAGNOSIS — J690 Pneumonitis due to inhalation of food and vomit: Secondary | ICD-10-CM | POA: Diagnosis not present

## 2023-07-12 DIAGNOSIS — J9601 Acute respiratory failure with hypoxia: Secondary | ICD-10-CM | POA: Diagnosis not present

## 2023-07-12 DIAGNOSIS — K7030 Alcoholic cirrhosis of liver without ascites: Secondary | ICD-10-CM | POA: Diagnosis not present

## 2023-07-12 DIAGNOSIS — G9341 Metabolic encephalopathy: Secondary | ICD-10-CM | POA: Diagnosis not present

## 2023-07-12 DIAGNOSIS — E43 Unspecified severe protein-calorie malnutrition: Secondary | ICD-10-CM | POA: Insufficient documentation

## 2023-07-12 DIAGNOSIS — K859 Acute pancreatitis without necrosis or infection, unspecified: Secondary | ICD-10-CM | POA: Diagnosis not present

## 2023-07-12 LAB — HEPATIC FUNCTION PANEL
ALT: 264 U/L — ABNORMAL HIGH (ref 0–44)
AST: 223 U/L — ABNORMAL HIGH (ref 15–41)
Albumin: 1.8 g/dL — ABNORMAL LOW (ref 3.5–5.0)
Alkaline Phosphatase: 122 U/L (ref 38–126)
Bilirubin, Direct: 7 mg/dL — ABNORMAL HIGH (ref 0.0–0.2)
Indirect Bilirubin: 3.9 mg/dL — ABNORMAL HIGH (ref 0.3–0.9)
Total Bilirubin: 10.9 mg/dL — ABNORMAL HIGH (ref 0.0–1.2)
Total Protein: 6.1 g/dL — ABNORMAL LOW (ref 6.5–8.1)

## 2023-07-12 LAB — RENAL FUNCTION PANEL
Albumin: 1.8 g/dL — ABNORMAL LOW (ref 3.5–5.0)
Anion gap: 11 (ref 5–15)
BUN: 29 mg/dL — ABNORMAL HIGH (ref 6–20)
CO2: 21 mmol/L — ABNORMAL LOW (ref 22–32)
Calcium: 7.2 mg/dL — ABNORMAL LOW (ref 8.9–10.3)
Chloride: 103 mmol/L (ref 98–111)
Creatinine, Ser: 2.3 mg/dL — ABNORMAL HIGH (ref 0.61–1.24)
GFR, Estimated: 32 mL/min — ABNORMAL LOW (ref >60)
Glucose, Bld: 134 mg/dL — ABNORMAL HIGH (ref 70–99)
Phosphorus: 2.8 mg/dL (ref 2.5–4.6)
Potassium: 3.7 mmol/L (ref 3.5–5.1)
Sodium: 135 mmol/L (ref 135–145)

## 2023-07-12 LAB — LIPASE, BLOOD: Lipase: 101 U/L — ABNORMAL HIGH (ref 11–51)

## 2023-07-12 LAB — GLUCOSE, CAPILLARY
Glucose-Capillary: 120 mg/dL — ABNORMAL HIGH (ref 70–99)
Glucose-Capillary: 114 mg/dL — ABNORMAL HIGH (ref 70–99)
Glucose-Capillary: 94 mg/dL (ref 70–99)
Glucose-Capillary: 192 mg/dL — ABNORMAL HIGH (ref 70–99)
Glucose-Capillary: 257 mg/dL — ABNORMAL HIGH (ref 70–99)
Glucose-Capillary: 54 mg/dL — ABNORMAL LOW (ref 70–99)
Glucose-Capillary: 58 mg/dL — ABNORMAL LOW (ref 70–99)
Glucose-Capillary: 226 mg/dL — ABNORMAL HIGH (ref 70–99)
Glucose-Capillary: 191 mg/dL — ABNORMAL HIGH (ref 70–99)

## 2023-07-12 LAB — MAGNESIUM
Magnesium: 2.4 mg/dL (ref 1.7–2.4)
Magnesium: 2.5 mg/dL — ABNORMAL HIGH (ref 1.7–2.4)
Magnesium: 2.4 mg/dL (ref 1.7–2.4)

## 2023-07-12 LAB — CBC
HCT: 28.1 % — ABNORMAL LOW (ref 39.0–52.0)
Hemoglobin: 9.6 g/dL — ABNORMAL LOW (ref 13.0–17.0)
MCH: 29.5 pg (ref 26.0–34.0)
MCHC: 34.2 g/dL (ref 30.0–36.0)
MCV: 86.5 fL (ref 80.0–100.0)
Platelets: 42 10*3/uL — ABNORMAL LOW (ref 150–400)
RBC: 3.25 MIL/uL — ABNORMAL LOW (ref 4.22–5.81)
RDW: 16.3 % — ABNORMAL HIGH (ref 11.5–15.5)
WBC: 18.5 10*3/uL — ABNORMAL HIGH (ref 4.0–10.5)
nRBC: 0.4 % — ABNORMAL HIGH (ref 0.0–0.2)

## 2023-07-12 LAB — PROTIME-INR
INR: 2.1 — ABNORMAL HIGH (ref 0.8–1.2)
Prothrombin Time: 23.9 s — ABNORMAL HIGH (ref 11.4–15.2)

## 2023-07-12 LAB — PHOSPHORUS: Phosphorus: 3.6 mg/dL (ref 2.5–4.6)

## 2023-07-12 MED ORDER — DEXTROSE 50 % IV SOLN
12.5000 g | INTRAVENOUS | Status: AC
  Administered 2023-07-12: 12.5 g via INTRAVENOUS
  Filled 2023-07-12: qty 50

## 2023-07-12 MED ORDER — DEXTROSE 10 % IV SOLN: INTRAVENOUS | Status: DC

## 2023-07-12 MED ORDER — DEXTROSE 50 % IV SOLN
INTRAVENOUS | Status: AC
  Filled 2023-07-12: qty 50

## 2023-07-12 MED ORDER — PROSOURCE TF20 ENFIT COMPATIBL EN LIQD
60.0000 mL | Freq: Every day | ENTERAL | Status: DC
  Administered 2023-07-12 – 2023-07-16 (×4): 60 mL
  Filled 2023-07-12 (×4): qty 60

## 2023-07-12 MED ORDER — DEXTROSE 50 % IV SOLN
25.0000 g | INTRAVENOUS | Status: AC
  Administered 2023-07-12: 25 g via INTRAVENOUS

## 2023-07-12 MED ORDER — MIDODRINE HCL 5 MG PO TABS
10.0000 mg | ORAL_TABLET | Freq: Three times a day (TID) | ORAL | Status: DC
  Administered 2023-07-12 – 2023-07-20 (×22): 10 mg
  Filled 2023-07-12 (×23): qty 2

## 2023-07-12 MED ORDER — QUETIAPINE FUMARATE 25 MG PO TABS
25.0000 mg | ORAL_TABLET | Freq: Two times a day (BID) | ORAL | Status: DC
  Administered 2023-07-12 (×2): 25 mg
  Filled 2023-07-12 (×2): qty 1

## 2023-07-12 MED ORDER — VITAL 1.5 CAL PO LIQD
1000.0000 mL | ORAL | Status: DC
  Administered 2023-07-12 – 2023-07-15 (×5): 1000 mL

## 2023-07-12 NOTE — Progress Notes (Signed)
 eLink Physician-Brief Progress Note Patient Name: [name] [name] DOB: 09/25/1965 MRN: [id number]   Date of Service  07/12/2023  HPI/Events of Note  Patient needs restraints to prevent self-harm / disruption of care.  eICU Interventions  Bilateral wrist restraints ordered.        Migdalia Dk 07/12/2023, 7:44 PM

## 2023-07-12 NOTE — Progress Notes (Signed)
 [facility] NEPHROLOGY PROGRESS NOTE  Assessment/ Plan: Pt is a 58 y.o. 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   # Dialysis dependent anuric AKI on CKD3b likely ischemic ATN, started HD 07/06/2023 using femoral temp HD catheter placed by surgery at [facility]. - CRRT 3/9-3/12.  Remains extubated but quite lethargic.  Not much urine output.  Not on pressors.  No need for urgent dialysis today.  We will reassess tomorrow morning for HD need.  Noted palliative care discussion about goals of care. Discussed with ICU team.  # Septic shock: Off of pressors now.  Monitor BP. antibiotics per primary team.    #Severe gallstone pancreatitis with transaminitis: Per critical care.  #Anion gap metabolic acidosis/lactic acidosis: Improving.  # Hyperkalemia resolved  Subjective: Seen and examined in ICU.  No issue overnight.  Urine output is recorded only 240 cc.  Remains somnolent but opens eyes with the name.  No new event.  In the room air.  Objective Vital signs in last 24 hours: Vitals:   07/12/23 0615 07/12/23 0733 07/12/23 0917 07/12/23 0920  BP:   133/61   Pulse: 70  64   Resp: 15  16   Temp:  98 F (36.7 C)    TempSrc:  Axillary    SpO2: 95%  98% 98%  Weight:      Height:       Weight change: -0.5 kg  Intake/Output Summary (Last 24 hours) at 07/12/2023 1030 Last data filed at 07/12/2023 0600 Gross per 24 hour  Intake 1041.58 ml  Output 543 ml  Net 498.58 ml       Labs: RENAL PANEL Recent Labs  Lab 07/10/23 0944 07/10/23 1717 07/11/23 0346 07/11/23 0350 07/11/23 0744 07/11/23 1700 07/12/23 0325 07/12/23 0800  NA 132* 132* 135 133*  --   --  135  --   K 4.3 4.2 4.0 3.9  --   --  3.7  --   CL 101 102 103 100  --   --  103  --   CO2 21* 23 24 21*  --   --  21*  --   GLUCOSE 131* 124* 120* 121*  --   --  134*  --   BUN 8 10  12 11   --   --  29*  --   CREATININE 1.40* 1.51* 1.35* 1.38*  --   --  2.30*  --   CALCIUM 7.5* 7.5* 7.6* 7.3*  --   --  7.2*  --   MG 2.2 2.4 2.3  --  2.4 2.5* 2.4 2.5*  PHOS 2.8 2.7  --  2.6  --  3.1 2.8  --   ALBUMIN 2.3* 2.2* 2.1* 2.1*  --   --  1.8* 1.8*    Liver Function Tests: Recent Labs  Lab 07/10/23 0346 07/10/23 0944 07/11/23 0346 07/11/23 0350 07/12/23 0325 07/12/23 0800  AST 463*  --  269*  --   --  223*  ALT 653*  --  424*  --   --  264*  ALKPHOS 62  --  72  --   --  122  BILITOT 8.1*  --  8.6*  --   --  10.9*  PROT 6.1*  --  6.3*  --   --  6.1*  ALBUMIN 2.3*   < > 2.1* 2.1* 1.8* 1.8*   < > =  values in this interval not displayed.   Recent Labs  Lab 07/08/23 0816 07/10/23 0346 07/12/23 0800  LIPASE 328* 44 101*   Recent Labs  Lab 07/07/23 0830  AMMONIA 60*   CBC: Recent Labs    09/22/22 0926 09/22/22 0927 09/22/22 1003 09/29/22 1052 12/02/22 2225 03/19/23 1359 03/19/23 1400 03/26/23 0954 06/25/23 1008 07/05/23 0615 07/08/23 1755 07/09/23 0307 07/10/23 0346 07/11/23 0346 07/12/23 0800  HGB 12.9*  --    < >  --    < > 10.1*  --    < >  --    < > 9.2* 8.6* 9.6* 9.3* 9.6*  MCV 90.7  --    < >  --    < > 92.2  --    < >  --    < >  --  87.3 86.7 86.9 86.5  VITAMINB12  --  687  --   --   --   --  1,053*  --   --   --   --   --   --   --   --   FOLATE 13.2  --   --   --   --  6.3  --   --   --   --   --   --   --   --   --   FERRITIN  --  217  --  210  --   --  561*  --  218  --   --   --   --   --   --   TIBC  --  273  --  256  --   --  175*  --  224*  --   --   --   --   --   --   IRON  --  41*  --  61  --   --  27*  --  50  --   --   --   --   --   --    < > = values in this interval not displayed.    Cardiac Enzymes: No results for input(s): "CKTOTAL", "CKMB", "CKMBINDEX", "TROPONINI" in the last 168 hours. CBG: Recent Labs  Lab 07/11/23 1947 07/11/23 1959 07/11/23 2327 07/12/23 0326 07/12/23 0731  GLUCAP 67* 109* 80 120* 114*     Iron Studies: No results for input(s): "IRON", "TIBC", "TRANSFERRIN", "FERRITIN" in the last 72 hours. Studies/Results: DG Abd Portable 1V Result Date: 07/11/2023 CLINICAL DATA:  Check feeding catheter placement EXAM: PORTABLE ABDOMEN - 1 VIEW COMPARISON:  07/10/2023 FINDINGS: Weighted feeding catheter is again noted in the distal stomach stable from the prior exam. No obstructive pattern is seen. No free air is noted. IMPRESSION: Feeding catheter in the distal stomach. Electronically Signed   By: Alcide Clever M.D.   On: 07/11/2023 11:19   DG Chest Port 1 View Result Date: 07/11/2023 CLINICAL DATA:  Respiratory failure EXAM: PORTABLE CHEST 1 VIEW COMPARISON:  07/09/2023 FINDINGS: Cardiac shadow is stable. Endotracheal tube, feeding catheter and right jugular central line are again seen and stable. Persistent right-sided pleural effusion is noted. Rounded fullness is again noted in the right perihilar region. No new focal abnormality is noted. IMPRESSION: Stable right-sided pleural effusion. Rounded soft tissue prominence in the right hilum. CT of the chest is again recommended when the patient's condition improves for further evaluation. Electronically Signed   By: Alcide Clever M.D.   On: 07/11/2023 10:32  CT HEAD WO CONTRAST ( ) Result Date: 07/10/2023 CLINICAL DATA:  58 year old male with neurologic deficit. EXAM: CT HEAD WITHOUT CONTRAST TECHNIQUE: Contiguous axial images were obtained from the base of the skull through the vertex without intravenous contrast. RADIATION DOSE REDUCTION: This exam was performed according to the departmental dose-optimization program which includes automated exposure control, adjustment of the mA and/or kV according to patient size and/or use of iterative reconstruction technique. COMPARISON:  Head CT 07/07/2023. FINDINGS: Brain: No midline shift, ventriculomegaly, mass effect, evidence of mass lesion, intracranial hemorrhage or evidence of cortically based acute  infarction. Stable gray-white matter differentiation throughout the brain. Bilateral cerebral hemisphere chronic small vessel disease appears stable. Vascular: No suspicious intracranial vascular hyperdensity. Calcified atherosclerosis at the skull base. Skull: Congenital incomplete ossification of the posterior C1 ring. No acute osseous abnormality identified. Sinuses/Orbits: Left nasoenteric tube now in place. Visualized paranasal sinuses and mastoids are stable and well aerated. Other: No acute orbit or scalp soft tissue finding. IMPRESSION: 1. Continued stable noncontrast CT appearance of small vessel disease. No acute intracranial abnormality. 2. Left nasoenteric tube now in place. Electronically Signed   By: Odessa Fleming M.D.   On: 07/10/2023 16:23    Medications: Infusions:  dextrose 20 mL/hr at 07/12/23 0600   feeding supplement (VITAL 1.5 CAL) 20 mL/hr at 07/12/23 0600   piperacillin-tazobactam (ZOSYN)  IV Stopped (07/12/23 0216)    Scheduled Medications:  arformoterol  15 mcg Nebulization BID   budesonide (PULMICORT) nebulizer solution  0.5 mg Nebulization BID   Chlorhexidine Gluconate Cloth  6 each Topical Q0600   folic acid  1 mg Per Tube Daily   haloperidol lactate       hydrocortisone sod succinate (SOLU-CORTEF) inj  100 mg Intravenous Daily   lactulose  10 g Per Tube BID   midodrine  10 mg Per Tube Q8H   mouth rinse  15 mL Mouth Rinse 4 times per day   pantoprazole (PROTONIX) IV  40 mg Intravenous Q24H   QUEtiapine  25 mg Per Tube BID   thiamine  100 mg Per Tube Daily    have reviewed scheduled and prn medications.  Physical Exam: General: Ill looking somnolent male in room air.  Not in distress Heart:RRR, s1s2 nl Lungs: Clear bilateral. Abdomen:soft, non-distended Extremities no leg edema  Dialysis Access: temp HD line.   Valeria Boza Prasad Aashritha Miedema 07/12/2023,10:30 AM  LOS: 7 days

## 2023-07-12 NOTE — Progress Notes (Signed)
 Palliative Medicine Progress Note   Patient Name: [name] [name]       Date: 07/12/2023 DOB: [birth year]  Age: 58 y.o. MRN#: [id number] Attending Physician: [name] Fowler, MD Primary Care Physician: [name] Spar, MD Admit Date: 07/05/2023   HPI/Patient Profile: 58 y.o. male  with past medical history of hypertension, COPD, alcoholic liver cirrhosis, hemosiderosis, CKD 3B, dyslipidemia, prior left lower extremity DVT presenting to [facility] health ED secondary to 3 to 4-day complaint of abdominal pain. He was admitted on 07/05/2023 with acute pancreatitis with septic shock and lactic acidosis, decompensated EtOH cirrhosis complicated by shock liver with associated coagulopathy and thrombocytopenia, acute encephalopathy multifactorial, and others.    He was transferred to [facility] for higher level of care.  He has since had a clinical decompensation.  See significant hospital events below.   Significant Hospital Events Hemodialysis 07/06/2023 (temporary HD line placed) s/p short HD, tx to [facility] 3/8 worsening shock, pressors, CTH/ CT a/p 3/9 intubated. On 3 pressors. Renal fxn and lactic acidosis worse. Started CRRT 3/10 LFTs a little better. Still on 3 pressors. Comfortable efforts on PSV but mental status not amendable to extubation.   Palliative medicine was consulted for GOC conversations.  Subjective: Chart reviewed including vital signs, labs, and progress notes.  Update received from RN.  Assessed patient at bedside.  He remains very lethargic, does not follow commands, and is non-verbal.  He opened his eyes spontaneously, but does not track.  I spoke with his sister/[name] [name] by phone.  Provided updates on patient's condition as per above.  Discussed that his prognosis remains very  guarded, even with best case scenario. I shared my concern regarding patient's mental status, and that he remains minimally responsive.  I again encouraged consideration of DNR status in the setting of patient's overall poor prognosis.  [name] [name] is agreeable to no CPR in the event of cardiac arrest, but wishes to otherwise continue supportive interventions for now.  Review of the importance of continued conversation with the medical team regarding overall plan of care.   Objective:  Physical Exam Constitutional:      General: He is not in acute distress.    Appearance: He is ill-appearing.  HENT:     Head:     Comments: Cortrak in place Pulmonary:     Effort: No  respiratory distress.  Abdominal:     General: There is distension.  Neurological:     Mental Status: He is lethargic.     Comments: Does not follow commands             Palliative Medicine Assessment & Plan   Assessment: Principal Problem:   Acute pancreatitis Active Problems:   Alcoholic cirrhosis (HCC)   Shock liver   Need for acute hemodialysis (HCC)   Acute biliary pancreatitis   Coagulopathy (HCC)   Somnolence   Septic shock (HCC)   Elevated liver enzymes   Palliative care by specialist   Goals of care, counseling/discussion   Protein-calorie malnutrition, severe    Recommendations/Plan: Code status changed to DNR with interventions  Continue current supportive interventions Ongoing discussion regarding GOC PMT will continue to support   Code Status: DNR - interventions  Prognosis: Very guarded   Discharge Planning: To Be Determined   Thank you for allowing the Palliative Medicine Team to assist in the care of this patient.   Time: 52 minutes  Detailed review of medical records (labs, imaging, vital signs), medically appropriate exam, discussed with treatment team, counseling and education to family, & staff, documenting clinical information, coordination of care.    [name] Proud, NP   Please contact Palliative Medicine Team phone at [phone] for questions and concerns.  For individual providers, please see AMION.

## 2023-07-12 NOTE — Plan of Care (Signed)
  Problem: Clinical Measurements: Goal: Ability to maintain clinical measurements within normal limits will improve Outcome: Progressing Goal: Will remain free from infection Outcome: Progressing Goal: Diagnostic test results will improve Outcome: Progressing Goal: Respiratory complications will improve Outcome: Progressing Goal: Cardiovascular complication will be avoided Outcome: Progressing   Problem: Activity: Goal: Risk for activity intolerance will decrease Outcome: Progressing   Problem: Nutrition: Goal: Adequate nutrition will be maintained Outcome: Progressing   Problem: Coping: Goal: Level of anxiety will decrease Outcome: Progressing   Problem: Elimination: Goal: Will not experience complications related to bowel motility Outcome: Progressing Goal: Will not experience complications related to urinary retention Outcome: Progressing   Problem: Pain Managment: Goal: General experience of comfort will improve and/or be controlled Outcome: Progressing   Problem: Safety: Goal: Ability to remain free from injury will improve Outcome: Progressing   Problem: Skin Integrity: Goal: Risk for impaired skin integrity will decrease Outcome: Progressing   Problem: Safety: Goal: Non-violent Restraint(s) Outcome: Progressing   Problem: Education: Goal: Knowledge of General Education information will improve Description: Including pain rating scale, medication(s)/side effects and non-pharmacologic comfort measures Outcome: Not Progressing   Problem: Health Behavior/Discharge Planning: Goal: Ability to manage health-related needs will improve Outcome: Not Progressing

## 2023-07-12 NOTE — Progress Notes (Signed)
 Hypoglycemic Event  CBG: 54   Treatment: D50 50 mL (25 gm)  Symptoms: None  Follow-up CBG: Time:1607 CBG Result:192  Possible Reasons for Event: Unknown  Comments/MD notified: Kreg Shropshire NP, see new orders     Mercy Riding

## 2023-07-12 NOTE — Progress Notes (Signed)
 Nutrition Brief Note  Patient discussed in IDT rounds.  Plans for paracentesis of ascites today. Abdomen distended, slightly taut.  Spoke with Pharmacy. Planning to d/c D10 if able to advance TF.  Per NP, await lipase levels. Spoke with MD in rounds who is amenable to advancement.  No abdominal pain or emesis reported. Still having BM's via FMS.   TF regimen: Advance Vital 1.5 by 10ml q8h to goal rate of 96ml/hr (1440 ml per day) 60ml Prosource TF20 once daily TF at goal provides: 2160 kcal, 117g protein and 1100 ml free water  [name] [name], RDN, LDN Clinical Nutrition

## 2023-07-12 NOTE — Progress Notes (Signed)
 POCUS abd  Evaluation for ascites  Findings Minimal ascites ~ 1 cm fluid w/ no large collections amendable to paracentesis. There was sig Hansell edema   Simonne Martinet ACNP-BC [facility] Pulmonary/Critical Care Pager # [phone] OR # [phone] if no answer

## 2023-07-12 NOTE — Progress Notes (Signed)
 NAME:  [name] [name], MRN:  [id number], DOB:  1965/07/31, LOS: 7 ADMISSION DATE:  07/05/2023, CONSULTATION DATE:  07/06/2023 REFERRING MD:  Randye Lobo, CHIEF COMPLAINT:  Abdominal Pain   History of Present Illness:  [name] [name] is a 58 year old gentleman with a known medical history significant for hypertension, COPD, alcoholic liver cirrhosis, hemosiderosis, CKD 3B, dyslipidemia, prior left lower extremity DVT presenting to [facility] health ED secondary to 3 to 4-day complaint of abdominal pain.  Patient describes bilateral flank type pain increasing in intensity without radiation to groin chest or back.  He states this is similar pain to his previous admission for pancreatitis.  Patient does elicit nausea and vomiting and diarrhea.   It is noted that the patient does have history of cholelithiasis and decompensated cirrhosis.  Patient reports he has not had any alcohol since his birthday on [birth year], however according to GI note he made comments that his last drink was on this past Sunday.  CT of the abdomen pelvis without contrast performed on July 05, 2023 reveals: IMPRESSION: 1. Pancreas is diffusely ill-defined through the tail region with subtle peripancreatic edema/inflammation. Imaging features compatible with acute pancreatitis. Pancreatic protocol CT recommended if renal function permits. If not, MRI of the abdomen with and without contrast recommended as pancreatic mass lesion cannot be excluded. 2. Hepatic cirrhosis with hepatic steatosis. 3. Cholelithiasis. 4. Left colonic diverticulosis without diverticulitis. 5. Atrophic left kidney.  Abdomen Limited ultrasound performed today 07/06/2023 reveals no significant abdominal ascites.  Laboratory indices: He does have elevation in coagulation studies with a PT of 26 on yesterday that elevated to 50.4 today 3 10/31/2023 and INR at admission yesterday 2.4 elevated to 5.5 today.  CBC: WBC 15.3, hemoglobin 11, hematocrit 36.7, platelets 164.   BMP sodium 132, potassium 5.8, chloride 99, CO2 14 BUN 40, creatinine 4.81 and glucose 214.  LFTs: Lipase 2929, AST 5021, ALT 05/08/2007.  Secondary to patient's known CKD presenting now with AKI and presence of oliguria a temporary HD line was placed by surgery team today for hemodialysis.  Patient underwent 2-hour HD in the ED prior to transfer.  Patient did receive IV fluids for pancreatitis management, as he is a poor candidate for liver transplant due to ongoing alcohol use per GI note.  Consults: Patient has been followed by nephrology and gastrointestinal services Blood cultures sent 07/06/2023 secondary to leukocytosis.  As such patient was transferred to [facility] for ICU monitoring and management and admitted with acute on chronic liver failure/pancreatitis.  Pertinent  Medical History  Asthma, B12 deficiency, cirrhosis, dyspnea, hyperlipidemia, history of kidney stones, hypertension, kidney stones, COPD, prior lower left extremity DVT, chronic anemia.  Significant Hospital Events: Including procedures, antibiotic start and stop dates in addition to other pertinent events   Hemodialysis 07/06/2023 (temporary HD line placed) s/p short HD, tx to [facility] 3/8 worsening shock, pressors, CTH/ CT a/p 3/9 intubated. On 3 pressors. Renal fxn and lactic acidosis worse. Started CRRT 3/10 LFTs a little better. Still on 3 pressors. Comfortable efforts on PSV but mental status not amendable to extubation. ECHO EF 55% no WMA, grade I diastolic dysfxn, started stress dose steroids. Right internal jugular CVL placed. The epi was weaned off over course of am after stress dose steroids started, NE weaning stopped fent gtt changed to PRN. D10 rate reduced to 10cc/hr. Acetylcysteine completed  3/11 vasopressin discontinued on very low-dose norepinephrine starting to wean sedation further to assess for readiness to wean ventilation but not responding and appeared  to have left gaze pref after sedation off for  prolonged time. CT head obtained no acute findings. Abx switched to zosyn 3/12 woke up during evening. Agitated at times. Attempting to get OOB. Still seeming to have left sided weakness but exam much closer to that of the 10th. Norepi off.  Extubated.  Taken off CRRT, encephalopathic but tolerated extubation 3/13 agitated during the evening hours, hypertensive and tachycardic moaning.  Started on Precedex.  This was discontinued on a.m. rounds.  Remains off pressors remains off dialysis minimal urine output.  Had a goals of care discussion with palliative and critical care.  Critical care recommending DO NOT RESUSCITATE, also shared with family patient's appropriateness for hospice going forward.  Family taking CODE STATUS into advisement with plan to follow-up   Interim History / Subjective:  More awake. Off pressors.  Objective   Blood pressure (!) 94/57, pulse 70, temperature 98 F (36.7 C), temperature source Axillary, resp. rate 15, height 6' (1.829 m), weight 78.6 kg, SpO2 95%.        Intake/Output Summary (Last 24 hours) at 07/12/2023 0800 Last data filed at 07/12/2023 0600 Gross per 24 hour  Intake 1081.55 ml  Output 637 ml  Net 444.55 ml   Filed Weights   07/10/23 0500 07/11/23 0500 07/12/23 0500  Weight: 80.8 kg 79.1 kg 78.6 kg    Examination:  General 58 year old male patient currently sedated on Precedex infusion he is in no acute distress HEENT normal cephalic atraumatic he has a right IJ triple-lumen catheter which is clean dry and intact pupils are equal and reactive mucous membranes are dry he has a core track placed  Pulmonary clear, some faint crackles in the right base currently on room air he has no accessory use Cardiac: Regular rate and rhythm Abdomen: Soft, liquid stools bowel sounds positive tolerating tube feeds Extremities warm dry GU minimal blood-tinged urine  Resolved Hospital Problem list   Lactic acid cleared  Acute hypoxic respiratory failure  resolved extubated 3/12 Assessment & Plan:  Acute alcoholic pancreatitis w/ circulatory (distributive)  shock c/b relative adrenal insuff  -Off pressors, all cultures negative -Lipase has been trending down 44 (normal), started tube feeds 3/12 Plan Keep euvolemic Cont stress dose steroids day 4, if remains off pressors can initiate rapid taper on 3/14 Repeat lipase this morning, he has no pain so if lipase stable we will start her increase tube feeds Continue midodrine  Decompensated ETOH cirrhosis, likely further complicated by shock liver with associated Coagulapathy & Thrombocytopenia -Has history of hemosiderosis and follows hematology. LFTs baseline from Feb AST 32 ALT 9 ALK PO4 94, nml total bili Peak AST 5021 Peak ALT 1709 (this continues to normalize), INR stable hanging around 2.1 Plan Continue to trend CBC, platelets, INR, awaiting a.m. CBC Holding AC repeat CBC in am  Acute encephalopathy, metabolic/ hepatic, with hyperammonemia also possible ETOH w/d Repeat CT head 3/11 for left gaze pref negative for acute changes Placed on Precedex last evening, this is led to oversedation in the past for him plan Lactulose scheduled empiric thiamine/ folate DC Precedex, I will add low-dose Seroquel twice daily Supportive care  If continues to have left-sided weakness through 3/14 we need to obtain MRI of brain  Right lower lobe atelectasis versus infiltrate.  Treating as aspiration pneumonia H/o COPD Extubated 3/12, currently on room air Plan Try to minimize sedation Continuous pulse oximetry Duration and reflux precautions Scheduled nebs He was changed from a carbapenem in effort to have additional abdominal  coverage to Zosyn on 3/12, he will complete a 7-day course total.  Stop date placed  Recurrent hypoglycemia - secondary to liver dysfunction, prob also further c/b adrenal dysfxn plan Remains on D10 at 20 cc an hour, will continue to trend.  Will likely be able to stop  this once he is on prior tube feed rate  AKI on CKD stage IIIb  - Baseline creatinine near 2.0, 3.6 on admit, CRRT started 3/9, taken off CRRT on 3/12.  Minimal urine output overnight acid-base remained stable serum creatinine increased from 1.38-2.3 Plan Ensure mean arterial pressure greater than 65 Strict intake Renal dose medications No indication for dialysis today, will check a.m. labs Family understands he is not a long-term dialysis candidate given his cirrhosis, intermittent dialysis could be continued short-term to see if we can get him better however this should be a time-limited trial Keep Foley in place for now  Fluid and electrolyte imbalance: intermittent Plan Replace and recheck as needed  Anemia of critical illness Plan Trend Transfuse for hgb < 7 or hemodynamic instability w/ active bleeding  Folate/B12/B6 deficiencies Plan Continue supplementation and follow-up with hematology   Prior history of left lower extremity DVT- ?2022 plan holding asa and empiric heparin SQ given coagulapathy and thrombocytopenia   Dyslipidemia Plan Cont to Hold statin given transaminitis Repeat in am    Possible mediastinal mass plan will need CT chest to further evaluate when stable  Protein calorie Malnutrition Plan Tube feeds started   Best Practice (right click and "Reselect all SmartList Selections" daily)   Diet/type cortrak ordered 3/10 DVT prophylaxis prophylactic heparin > hold SCD Pressure ulcer(s): N/A GI prophylaxis: PPI Lines: Dialysis Catheter and Arterial Line CVL right internal jugular placed 3/10 Foley: needed  Code Status:  full code Last date of multidisciplinary goals of care discussion []  There is GOC discussion today at 10 w/ PCM, CCM and family   Sister updated 3/11   My cct 32 min

## 2023-07-13 ENCOUNTER — Inpatient Hospital Stay (HOSPITAL_COMMUNITY)

## 2023-07-13 DIAGNOSIS — Z66 Do not resuscitate: Secondary | ICD-10-CM | POA: Diagnosis not present

## 2023-07-13 DIAGNOSIS — R6521 Severe sepsis with septic shock: Secondary | ICD-10-CM | POA: Diagnosis not present

## 2023-07-13 DIAGNOSIS — K859 Acute pancreatitis without necrosis or infection, unspecified: Secondary | ICD-10-CM | POA: Diagnosis not present

## 2023-07-13 DIAGNOSIS — G9341 Metabolic encephalopathy: Secondary | ICD-10-CM | POA: Diagnosis not present

## 2023-07-13 DIAGNOSIS — Z515 Encounter for palliative care: Secondary | ICD-10-CM | POA: Diagnosis not present

## 2023-07-13 DIAGNOSIS — A419 Sepsis, unspecified organism: Secondary | ICD-10-CM | POA: Diagnosis not present

## 2023-07-13 DIAGNOSIS — R1084 Generalized abdominal pain: Secondary | ICD-10-CM | POA: Diagnosis not present

## 2023-07-13 LAB — RENAL FUNCTION PANEL
Albumin: 1.8 g/dL — ABNORMAL LOW (ref 3.5–5.0)
Anion gap: 10 (ref 5–15)
BUN: 52 mg/dL — ABNORMAL HIGH (ref 6–20)
CO2: 20 mmol/L — ABNORMAL LOW (ref 22–32)
Calcium: 7.3 mg/dL — ABNORMAL LOW (ref 8.9–10.3)
Chloride: 102 mmol/L (ref 98–111)
Creatinine, Ser: 3.35 mg/dL — ABNORMAL HIGH (ref 0.61–1.24)
GFR, Estimated: 20 mL/min — ABNORMAL LOW (ref >60)
Glucose, Bld: 133 mg/dL — ABNORMAL HIGH (ref 70–99)
Phosphorus: 3.5 mg/dL (ref 2.5–4.6)
Potassium: 3.4 mmol/L — ABNORMAL LOW (ref 3.5–5.1)
Sodium: 132 mmol/L — ABNORMAL LOW (ref 135–145)

## 2023-07-13 LAB — CBC
HCT: 29.9 % — ABNORMAL LOW (ref 39.0–52.0)
Hemoglobin: 10.5 g/dL — ABNORMAL LOW (ref 13.0–17.0)
MCH: 29.7 pg (ref 26.0–34.0)
MCHC: 35.1 g/dL (ref 30.0–36.0)
MCV: 84.7 fL (ref 80.0–100.0)
Platelets: 48 10*3/uL — ABNORMAL LOW (ref 150–400)
RBC: 3.53 MIL/uL — ABNORMAL LOW (ref 4.22–5.81)
RDW: 16 % — ABNORMAL HIGH (ref 11.5–15.5)
WBC: 21.6 10*3/uL — ABNORMAL HIGH (ref 4.0–10.5)
nRBC: 0.3 % — ABNORMAL HIGH (ref 0.0–0.2)

## 2023-07-13 LAB — MAGNESIUM
Magnesium: 2.6 mg/dL — ABNORMAL HIGH (ref 1.7–2.4)
Magnesium: 2.4 mg/dL (ref 1.7–2.4)
Magnesium: 2.5 mg/dL — ABNORMAL HIGH (ref 1.7–2.4)

## 2023-07-13 LAB — HEPATIC FUNCTION PANEL
ALT: 224 U/L — ABNORMAL HIGH (ref 0–44)
AST: 161 U/L — ABNORMAL HIGH (ref 15–41)
Albumin: 1.8 g/dL — ABNORMAL LOW (ref 3.5–5.0)
Alkaline Phosphatase: 167 U/L — ABNORMAL HIGH (ref 38–126)
Bilirubin, Direct: 8.6 mg/dL — ABNORMAL HIGH (ref 0.0–0.2)
Indirect Bilirubin: 4.7 mg/dL — ABNORMAL HIGH (ref 0.3–0.9)
Total Bilirubin: 13.3 mg/dL — ABNORMAL HIGH (ref 0.0–1.2)
Total Protein: 6.8 g/dL (ref 6.5–8.1)

## 2023-07-13 LAB — PHOSPHORUS
Phosphorus: 3.4 mg/dL (ref 2.5–4.6)
Phosphorus: 4.2 mg/dL (ref 2.5–4.6)

## 2023-07-13 LAB — LIPASE, BLOOD: Lipase: 150 U/L — ABNORMAL HIGH (ref 11–51)

## 2023-07-13 LAB — GLUCOSE, CAPILLARY
Glucose-Capillary: 124 mg/dL — ABNORMAL HIGH (ref 70–99)
Glucose-Capillary: 119 mg/dL — ABNORMAL HIGH (ref 70–99)
Glucose-Capillary: 24 mg/dL — CL (ref 70–99)
Glucose-Capillary: <10 mg/dL — CL (ref 70–99)
Glucose-Capillary: 93 mg/dL (ref 70–99)
Glucose-Capillary: 97 mg/dL (ref 70–99)
Glucose-Capillary: 120 mg/dL — ABNORMAL HIGH (ref 70–99)

## 2023-07-13 LAB — PROTIME-INR
INR: 1.9 — ABNORMAL HIGH (ref 0.8–1.2)
Prothrombin Time: 21.8 s — ABNORMAL HIGH (ref 11.4–15.2)

## 2023-07-13 MED ORDER — HALOPERIDOL LACTATE 5 MG/ML IJ SOLN
5.0000 mg | Freq: Four times a day (QID) | INTRAMUSCULAR | Status: DC | PRN
Start: 2023-07-13 — End: 2023-07-25
  Administered 2023-07-17 – 2023-07-23 (×2): 5 mg via INTRAVENOUS
  Filled 2023-07-13 (×3): qty 1

## 2023-07-13 MED ORDER — QUETIAPINE FUMARATE 50 MG PO TABS
50.0000 mg | ORAL_TABLET | Freq: Two times a day (BID) | ORAL | Status: DC
  Administered 2023-07-13 – 2023-07-22 (×17): 50 mg
  Filled 2023-07-13 (×18): qty 1

## 2023-07-13 MED ORDER — OXYCODONE HCL 5 MG PO TABS
5.0000 mg | ORAL_TABLET | Freq: Four times a day (QID) | ORAL | Status: DC
  Administered 2023-07-13 – 2023-07-15 (×7): 5 mg
  Filled 2023-07-13 (×7): qty 1

## 2023-07-13 MED ORDER — PIPERACILLIN-TAZOBACTAM 3.375 G IVPB
3.3750 g | Freq: Three times a day (TID) | INTRAVENOUS | Status: AC
  Administered 2023-07-13 (×2): 3.375 g via INTRAVENOUS
  Filled 2023-07-13 (×2): qty 50

## 2023-07-13 MED ORDER — NOREPINEPHRINE 16 MG/250ML-% IV SOLN
0.0000 ug/min | INTRAVENOUS | Status: DC
  Administered 2023-07-13: 2 ug/min via INTRAVENOUS
  Administered 2023-07-14: 7 ug/min via INTRAVENOUS
  Filled 2023-07-13 (×2): qty 250

## 2023-07-13 MED ORDER — CHLORHEXIDINE GLUCONATE CLOTH 2 % EX PADS
6.0000 | MEDICATED_PAD | Freq: Every day | CUTANEOUS | Status: DC
Start: 2023-07-14 — End: 2023-07-18
  Administered 2023-07-14 – 2023-07-16 (×4): 6 via TOPICAL

## 2023-07-13 MED ORDER — NICOTINE 14 MG/24HR TD PT24
14.0000 mg | MEDICATED_PATCH | Freq: Every day | TRANSDERMAL | Status: DC
  Administered 2023-07-14 – 2023-07-24 (×12): 14 mg via TRANSDERMAL
  Filled 2023-07-13 (×12): qty 1

## 2023-07-13 MED ORDER — OLANZAPINE 10 MG IM SOLR
2.5000 mg | Freq: Once | INTRAMUSCULAR | Status: AC | PRN
  Administered 2023-07-13: 2.5 mg via INTRAMUSCULAR
  Filled 2023-07-13: qty 10

## 2023-07-13 MED ORDER — FUROSEMIDE 10 MG/ML IJ SOLN
40.0000 mg | Freq: Two times a day (BID) | INTRAMUSCULAR | Status: DC
  Administered 2023-07-13 (×2): 40 mg via INTRAVENOUS
  Filled 2023-07-13 (×2): qty 4

## 2023-07-13 NOTE — Progress Notes (Signed)
 Daily Progress Note   Patient Name: [name] [name]       Date: 07/13/2023 DOB: 1965/09/08  Age: 58 y.o. MRN#: [id number] Attending Physician: Cheri Fowler, MD Primary Care Physician: Benetta Spar, MD Admit Date: 07/05/2023 Length of Stay: 8 days  Reason for Consultation/Follow-up: Establishing goals of care  HPI/Patient Profile:  58 y.o. male  with past medical history of hypertension, COPD, alcoholic liver cirrhosis, hemosiderosis, CKD 3B, dyslipidemia, prior left lower extremity DVT presenting to [facility] health ED secondary to 3 to 4-day complaint of abdominal pain. He was admitted on 07/05/2023 with acute pancreatitis with septic shock and lactic acidosis, decompensated EtOH cirrhosis complicated by shock liver with associated coagulopathy and thrombocytopenia, acute encephalopathy multifactorial, and others.    He was transferred to [facility] for higher level of care.  He has since had a clinical decompensation.  See significant hospital events below.   Significant Hospital Events Hemodialysis 07/06/2023 (temporary HD line placed) s/p short HD, tx to [facility] 3/8 worsening shock, pressors, CTH/ CT a/p 3/9 intubated. On 3 pressors. Renal fxn and lactic acidosis worse. Started CRRT 3/10 LFTs a little better. Still on 3 pressors. Comfortable efforts on PSV but mental status not amendable to extubation.   Palliative medicine was consulted for GOC conversations.  Subjective:   Subjective: Chart Reviewed. Updates received. Patient Assessed. Created space and opportunity for patient  and family to explore thoughts and feelings regarding current medical situation.  Today's Discussion: Today before seeing the patient I reviewed the chart.  MELD score today calculated at 37 (33.2% predicted 90 days arrival).  I spoke with the nurse and confirm he is off pressors, he did pull out his core track today but they are and they are placing anyone now.  He is on Precedex help with agitation.  He  has minimal urine output and nephrology is planning to try Lasix to see if they can encourage output.  He is off CRRT and nephrology tentatively planning for [facility] tomorrow.  Patient remains a DNR, full scope otherwise.  PCCM did inform family that he is hospice appropriate moving forward.  I went to see the patient he is awake, alert.  He is quite confused and agitated.  He keeps asking for help and when asked what he needs he states "get me out of here."  I informed him that he is too sick to leave the hospital and he states "no I am not explanation point".  He states that we have called the police on him and has not right.  I asked him if he is hurting and he says no.   After seeing the patient I reached out and spoke with his sister [name] [name].  We spent some time discussing medical updates today.  She seems happy to know that he is more awake.  However we discussed the overall difficult medical situation and she seems to be very understanding that this is a tough spot to be in.  We discussed that there is no "cure" or fix for his liver disease.  We discussed that there is a ceiling to the recovery he can have.  She shares that they discussed extensively yesterday with my colleague [name] [name] as well as PCCM what his recovery will look like and what his life will look like.  I discussed that palliative medicine will be here during his admission on this journey with him.  We will take things as they come and make recommendations as needed.  She understands  he is very sick.  She did ask about now that he is awake if they are going to cancel the MRI.  I told her I was not sure and reached out to [facility] for an answer.  I shared I would call back when I hear back.  I told her that given that he is woken up we would give 2 to 3 days for outcomes and follow-up early next week.  However, confirm she has her contact information and she can reach out for any questions or concerns.  I provided emotional and general support  through therapeutic listening, empathy, sharing of stories, and other techniques. I answered all questions and addressed all concerns to the best of my ability.  ROS Limited due to mental status Review of Systems  Constitutional:        Denies pain in general    Objective:   Vital Signs:  BP 121/73   Pulse (!) 117   Temp 98.6 F (37 C) (Oral)   Resp 19   Ht 6' (1.829 m)   Wt 80.3 kg   SpO2 96%   BMI 24.01 kg/m   Physical Exam Vitals and nursing note reviewed.  Constitutional:      General: He is not in acute distress.    Appearance: He is ill-appearing.  HENT:     Head: Normocephalic and atraumatic.  Cardiovascular:     Rate and Rhythm: Normal rate.  Pulmonary:     Effort: Pulmonary effort is normal. No respiratory distress.  Abdominal:     General: Bowel sounds are normal. There is distension.  Skin:    General: Skin is warm and dry.  Neurological:     Mental Status: He is alert. He is disoriented and confused.  Psychiatric:     Comments: Seems agitated     Palliative Assessment/Data: 10%    Existing Vynca/ACP Documentation: None  Assessment & Plan:   Impression: Present on Admission:  Acute pancreatitis  Shock liver  Acute biliary pancreatitis  Alcoholic cirrhosis (HCC)  58 year old male with acute presentation chronic comorbidities as described above. He is in a very severe clinical situation with fulminant hepatic failure. Peak MELD of 48 (0.0% 90-day predicted survivability). This has improved some but remains very high risk of mortality.  Family is since made the patient DNR, full scope of care otherwise.  They understand there is a limit to the recovery, no care for his liver disease.  Overall prognosis poor.   SUMMARY OF RECOMMENDATIONS   DNR-interventions desired Full scope of care otherwise Allow time for outcomes medically and for improvement in mental status Family is hopeful the patient will be able to participate in GOC  conversations Palliative medicine will follow-up in 2 to 3 days Please notify us of significant palliative needs before then  Symptom Management:  Per primary team PMT is available to assist as needed  Code Status: DNR-interventions desired  Prognosis: Unable to determine  Discharge Planning: To Be Determined  Discussed with: Patient, family, medical team, nursing team  Thank you for allowing [place] to participate in the care of ADLER CHARTRAND PMT will continue to support holistically.  Time Total: 62 min  Detailed review of medical records (labs, imaging, vital signs), medically appropriate exam, discussed with treatment team, counseling and education to patient, family, & staff, documenting clinical information, medication management, coordination of care  Wynne Dust, NP Palliative Medicine Team  Team Phone # [phone] (Nights/Weekends)  12/28/2020, 8:17 AM

## 2023-07-13 NOTE — Plan of Care (Signed)
   Problem: Nutrition: Goal: Adequate nutrition will be maintained Outcome: Progressing   Problem: Pain Managment: Goal: General experience of comfort will improve and/or be controlled Outcome: Progressing   Problem: Safety: Goal: Ability to remain free from injury will improve Outcome: Progressing

## 2023-07-13 NOTE — Progress Notes (Signed)
 [facility] NEPHROLOGY PROGRESS NOTE  Assessment/ Plan: Pt is a 58 y.o. 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   # Dialysis dependent anuric AKI on CKD3b likely ischemic ATN, started HD 07/06/2023 using femoral temp HD catheter placed by surgery at [facility]. - CRRT 3/9-3/12.  Remains extubated, more alert but confused.  Not much urine output.   No need for dialysis today but BUN and crt rose quite a bit off of CRRT .  We will tentatively plan for HD tomorrow.  Will start some lasix to see if can induce some UOP.  Noted palliative care discussion about goals of care.   # Septic shock: Off of pressors now.  Monitor BP. antibiotics per primary team.    #Severe gallstone pancreatitis with transaminitis: Per critical care.  #Anion gap metabolic acidosis/lactic acidosis: Improving.  # Hyperkalemia resolved  Anemia-  not a major issue   Subjective:  Urine output is recorded only 200 cc.  Is alert but not really answering questions appropriately-  says that he needs to go home    Objective Vital signs in last 24 hours: Vitals:   07/13/23 0715 07/13/23 0730 07/13/23 0733 07/13/23 0745  BP:      Pulse: (!) 107 (!) 103  (!) 104  Resp: 20 16  16   Temp:   98.3 F (36.8 C)   TempSrc:   Oral   SpO2: 90% 95%  98%  Weight:      Height:       Weight change: 1.7 kg  Intake/Output Summary (Last 24 hours) at 07/13/2023 0857 Last data filed at 07/13/2023 0700 Gross per 24 hour  Intake 1318.14 ml  Output 200 ml  Net 1118.14 ml       Labs: RENAL PANEL Recent Labs  Lab 07/10/23 1717 07/11/23 0346 07/11/23 0350 07/11/23 0744 07/11/23 1700 07/12/23 0325 07/12/23 0800 07/12/23 1800 07/13/23 0547  NA 132* 135 133*  --   --  135  --   --  132*  K 4.2 4.0 3.9  --   --  3.7  --   --  3.4*  CL 102 103 100  --   --  103  --   --  102  CO2 23 24  21*  --   --  21*  --   --  20*  GLUCOSE 124* 120* 121*  --   --  134*  --   --  133*  BUN 10 12 11   --   --  29*  --   --  52*  CREATININE 1.51* 1.35* 1.38*  --   --  2.30*  --   --  3.35*  CALCIUM 7.5* 7.6* 7.3*  --   --  7.2*  --   --  7.3*  MG 2.4 2.3  --    < > 2.5* 2.4 2.5* 2.4 2.6*  PHOS 2.7  --  2.6  --  3.1 2.8  --  3.6 3.4  3.5  ALBUMIN 2.2* 2.1* 2.1*  --   --  1.8* 1.8*  --  1.8*  1.8*   < > = values in this interval not displayed.    Liver Function Tests: Recent Labs  Lab 07/11/23 0346 07/11/23 0350 07/12/23 0325 07/12/23 0800 07/13/23 0547  AST 269*  --   --  223* 161*  ALT 424*  --   --  264* 224*  ALKPHOS 72  --   --  122 167*  BILITOT 8.6*  --   --  10.9* 13.3*  PROT 6.3*  --   --  6.1* 6.8  ALBUMIN 2.1*   < > 1.8* 1.8* 1.8*  1.8*   < > = values in this interval not displayed.   Recent Labs  Lab 07/10/23 0346 07/12/23 0800 07/13/23 0547  LIPASE 44 101* 150*   Recent Labs  Lab 07/07/23 0830  AMMONIA 60*   CBC: Recent Labs    09/22/22 0926 09/22/22 0927 09/22/22 1003 09/29/22 1052 12/02/22 2225 03/19/23 1359 03/19/23 1400 03/26/23 0954 06/25/23 1008 07/05/23 0615 07/09/23 0307 07/10/23 0346 07/11/23 0346 07/12/23 0800 07/13/23 0547  HGB 12.9*  --    < >  --    < > 10.1*  --    < >  --    < > 8.6* 9.6* 9.3* 9.6* 10.5*  MCV 90.7  --    < >  --    < > 92.2  --    < >  --    < > 87.3 86.7 86.9 86.5 84.7  VITAMINB12  --  687  --   --   --   --  1,053*  --   --   --   --   --   --   --   --   FOLATE 13.2  --   --   --   --  6.3  --   --   --   --   --   --   --   --   --   FERRITIN  --  217  --  210  --   --  561*  --  218  --   --   --   --   --   --   TIBC  --  273  --  256  --   --  175*  --  224*  --   --   --   --   --   --   IRON  --  41*  --  61  --   --  27*  --  50  --   --   --   --   --   --    < > = values in this interval not displayed.    Cardiac Enzymes: No results for input(s): "CKTOTAL", "CKMB", "CKMBINDEX", "TROPONINI" in  the last 168 hours. CBG: Recent Labs  Lab 07/12/23 2326 07/13/23 0322 07/13/23 0730 07/13/23 0737 07/13/23 0738  GLUCAP 191* 124* 24* <10* 119*    Iron Studies: No results for input(s): "IRON", "TIBC", "TRANSFERRIN", "FERRITIN" in the last 72 hours. Studies/Results: DG Abd Portable 1V Result Date: 07/11/2023 CLINICAL DATA:  Check feeding catheter placement EXAM: PORTABLE ABDOMEN - 1 VIEW COMPARISON:  07/10/2023 FINDINGS: Weighted feeding catheter is again noted in the distal stomach stable from the prior exam. No obstructive pattern is seen. No free air is noted. IMPRESSION: Feeding catheter in the distal stomach. Electronically Signed   By: Alcide Clever M.D.   On: 07/11/2023 11:19    Medications: Infusions:  dextrose Stopped (07/12/23 1614)   feeding supplement (VITAL 1.5 CAL) Stopped (07/13/23 0257)   piperacillin-tazobactam (ZOSYN)  IV 100 mL/hr at 07/13/23 0600    Scheduled Medications:  arformoterol  15 mcg Nebulization BID   budesonide (PULMICORT) nebulizer solution  0.5 mg Nebulization BID   Chlorhexidine Gluconate  Cloth  6 each Topical Q0600   feeding supplement (PROSource TF20)  60 mL Per Tube Daily   folic acid  1 mg Per Tube Daily   lactulose  10 g Per Tube BID   midodrine  10 mg Per Tube Q8H   mouth rinse  15 mL Mouth Rinse 4 times per day   oxyCODONE  5 mg Per Tube Q6H   pantoprazole (PROTONIX) IV  40 mg Intravenous Q24H   QUEtiapine  50 mg Per Tube BID   thiamine  100 mg Per Tube Daily    have reviewed scheduled and prn medications.  Physical Exam: General: Ill looking  Not in distress-  sounds like more alert but confused  Heart:RRR, s1s2 nl Lungs: Clear bilateral. Abdomen:soft, non-distended Extremities no leg edema  Dialysis Access: temp HD line- .placed on 3/7  Amelia Burgard A Jamell Laymon 07/13/2023,8:57 AM  LOS: 8 days

## 2023-07-13 NOTE — Progress Notes (Addendum)
 eLink Physician-Brief Progress Note Patient Name: [name] [name] DOB: Jun 20, 1965 MRN: [id number]   Date of Service  07/13/2023  HPI/Events of Note  58 y.o. male with past medical history of hypertension, COPD, alcoholic liver cirrhosis, hemosiderosis, CKD 3B, dyslipidemia, prior left lower extremity DVT presenting to [facility] health ED secondary to 3 to 4-day complaint of abdominal pain.   Ongoing agitation despite quetiapine 50 mg twice daily.    eICU Interventions  Renew soft restraints, renew Haldol up to 3 doses   2346 -keeps asking for cigarettes, continues to be confused.  Smokes at least a pack a day.  Add nicotine.  0302 - Aline malfunction. DC dysfunctional aline. Cuff pressures are appropriate. Titrate by cuff.   Intervention Category Minor Interventions: Agitation / anxiety - evaluation and management  Maxx Pham 07/13/2023, 7:38 PM

## 2023-07-13 NOTE — Procedures (Signed)
 Cortrak  Person Inserting Tube:  [name] [name], RD Tube Type:  Cortrak - 43 inches Tube Size:  10 Tube Location:  Left nare Secured by: Bridle Technique Used to Measure Tube Placement:  Marking at nare/corner of mouth Cortrak Secured At:  75 cm   Cortrak Tube Team Note:  Consult received to place a Cortrak feeding tube.   X-ray has been ordered by the Cortrak team. Please await placement confirmation prior to using Cortrak tube.  If the tube becomes dislodged please keep the tube and contact the Cortrak team at [email or web address] for replacement.  If after hours and replacement cannot be delayed, place a NG tube and confirm placement with an abdominal x-ray.    Shelle Iron RD, LDN Contact via [employer].

## 2023-07-13 NOTE — Progress Notes (Signed)
 NAME:  [name] [name], MRN:  [id number], DOB:  1965-12-13, LOS: 8 ADMISSION DATE:  07/05/2023, CONSULTATION DATE:  07/06/2023 REFERRING MD:  [name] [name], CHIEF COMPLAINT:  Abdominal Pain   History of Present Illness:  [name] [name] is a 58 year old gentleman with a known medical history significant for hypertension, COPD, alcoholic liver cirrhosis, hemosiderosis, CKD 3B, dyslipidemia, prior left lower extremity DVT presenting to [facility] health ED secondary to 3 to 4-day complaint of abdominal pain.  Patient describes bilateral flank type pain increasing in intensity without radiation to groin chest or back.  He states this is similar pain to his previous admission for pancreatitis.  Patient does elicit nausea and vomiting and diarrhea.   It is noted that the patient does have history of cholelithiasis and decompensated cirrhosis.  Patient reports he has not had any alcohol since his birthday on [birth year], however according to GI note he made comments that his last drink was on this past Sunday.  CT of the abdomen pelvis without contrast performed on July 05, 2023 reveals: IMPRESSION: 1. Pancreas is diffusely ill-defined through the tail region with subtle peripancreatic edema/inflammation. Imaging features compatible with acute pancreatitis. Pancreatic protocol CT recommended if renal function permits. If not, MRI of the abdomen with and without contrast recommended as pancreatic mass lesion cannot be excluded. 2. Hepatic cirrhosis with hepatic steatosis. 3. Cholelithiasis. 4. Left colonic diverticulosis without diverticulitis. 5. Atrophic left kidney.  Abdomen Limited ultrasound performed today 07/06/2023 reveals no significant abdominal ascites.  Laboratory indices: He does have elevation in coagulation studies with a PT of 26 on yesterday that elevated to 50.4 today 3 10/31/2023 and INR at admission yesterday 2.4 elevated to 5.5 today.  CBC: WBC 15.3, hemoglobin 11, hematocrit 36.7, platelets 164.   BMP sodium 132, potassium 5.8, chloride 99, CO2 14 BUN 40, creatinine 4.81 and glucose 214.  LFTs: Lipase 2929, AST 5021, ALT 05/08/2007.  Secondary to patient's known CKD presenting now with AKI and presence of oliguria a temporary HD line was placed by surgery team today for hemodialysis.  Patient underwent 2-hour HD in the ED prior to transfer.  Patient did receive IV fluids for pancreatitis management, as he is a poor candidate for liver transplant due to ongoing alcohol use per GI note.  Consults: Patient has been followed by nephrology and gastrointestinal services Blood cultures sent 07/06/2023 secondary to leukocytosis.  As such patient was transferred to [facility] for ICU monitoring and management and admitted with acute on chronic liver failure/pancreatitis.  Pertinent  Medical History  Asthma, B12 deficiency, cirrhosis, dyspnea, hyperlipidemia, history of kidney stones, hypertension, kidney stones, COPD, prior lower left extremity DVT, chronic anemia.  Significant Hospital Events: Including procedures, antibiotic start and stop dates in addition to other pertinent events   Hemodialysis 07/06/2023 (temporary HD line placed) s/p short HD, tx to [facility] 3/8 worsening shock, pressors, CTH/ CT a/p 3/9 intubated. On 3 pressors. Renal fxn and lactic acidosis worse. Started CRRT 3/10 LFTs a little better. Still on 3 pressors. Comfortable efforts on PSV but mental status not amendable to extubation. ECHO EF 55% no WMA, grade I diastolic dysfxn, started stress dose steroids. Right internal jugular CVL placed. The epi was weaned off over course of am after stress dose steroids started, NE weaning stopped fent gtt changed to PRN. D10 rate reduced to 10cc/hr. Acetylcysteine completed  3/11 vasopressin discontinued on very low-dose norepinephrine starting to wean sedation further to assess for readiness to wean ventilation but not responding and appeared  to have left gaze pref after sedation off for  prolonged time. CT head obtained no acute findings. Abx switched to zosyn 3/12 woke up during evening. Agitated at times. Attempting to get OOB. Still seeming to have left sided weakness but exam much closer to that of the 10th. Norepi off.  Extubated.  Taken off CRRT, encephalopathic but tolerated extubation 3/13 agitated during the evening hours, hypertensive and tachycardic moaning.  Started on Precedex.  This was discontinued on a.m. rounds.  Remains off pressors remains off dialysis minimal urine output.  Had a goals of care discussion with palliative and critical care.  Critical care recommending DO NOT RESUSCITATE, also shared with family patient's appropriateness for hospice going forward.  Family taking CODE STATUS into advisement with plan to follow-up   Interim History / Subjective:  Patient was agitated overnight, pulled out his core track He is awake, following commands Remain afebrile Off vasopressors  Objective   Blood pressure 121/73, pulse (!) 104, temperature 98.3 F (36.8 C), temperature source Oral, resp. rate 16, height 6' (1.829 m), weight 80.3 kg, SpO2 98%.        Intake/Output Summary (Last 24 hours) at 07/13/2023 1610 Last data filed at 07/13/2023 0700 Gross per 24 hour  Intake 1318.14 ml  Output 200 ml  Net 1118.14 ml   Filed Weights   07/11/23 0500 07/12/23 0500 07/13/23 0500  Weight: 79.1 kg 78.6 kg 80.3 kg    Examination: General: Chronically ill-appearing male, lying on the bed HEENT: [place]/AT, eyes icteric.  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  Due for IHD today Anemia of critical illness Recurrent hypoglycemia Possible mediastinal mass Severe protein calorie malnutrition Hypokalemia  Patient remain off vasopressors for 48 hours Stress dose steroids were stopped Continue antibiotics with Zosyn to complete 7 days therapy Cultures have been negative Titrate nasal cannula oxygen with O2 sat goal 92% Denies abdominal pain Removed cortrak last night Once Cortrak is replaced, resume tube feeds LFTs improving INR trended down to 1.9 Platelet counts are stable in 40s, watch for signs of bleeding Mental status slowly improving Continue lactulose Intermittently gets agitated Increase Seroquel to 50 mg twice daily Darted on oxycodone 5 mg every 6 hours per tube Nephrology is following, patient is due for IHD today Monitor H&H Continue D10 infusion for now, will be discontinued once tube feed is started Will need CT chest once clinically stable to rule out mediastinal mass Continue dietary supplements Holding potassium supplement considering he is not making urine, will receive HD today   Best Practice (right click and "Reselect all SmartList Selections" daily)   Diet: Cotrak today then tube feeds DVT prophylaxis prophylactic heparin  Pressure ulcer(s): N/A GI prophylaxis: PPI Lines: Discontinue arterial line, central line.  HD catheter needs to be in for now Foley: Discontinue Code Status: DNR Last date of multidisciplinary goals of care  discussion [palliative care is following]    Cheri Fowler, MD [facility] See Amion for  pager If no response to pager, please call [phone] until 7pm After 7pm, Please call E-link [phone]

## 2023-07-14 ENCOUNTER — Inpatient Hospital Stay (HOSPITAL_COMMUNITY)

## 2023-07-14 DIAGNOSIS — K7200 Acute and subacute hepatic failure without coma: Secondary | ICD-10-CM | POA: Diagnosis not present

## 2023-07-14 DIAGNOSIS — K852 Alcohol induced acute pancreatitis without necrosis or infection: Secondary | ICD-10-CM | POA: Diagnosis not present

## 2023-07-14 DIAGNOSIS — R579 Shock, unspecified: Secondary | ICD-10-CM | POA: Diagnosis not present

## 2023-07-14 DIAGNOSIS — G9341 Metabolic encephalopathy: Secondary | ICD-10-CM | POA: Diagnosis not present

## 2023-07-14 DIAGNOSIS — E43 Unspecified severe protein-calorie malnutrition: Secondary | ICD-10-CM

## 2023-07-14 DIAGNOSIS — R739 Hyperglycemia, unspecified: Secondary | ICD-10-CM

## 2023-07-14 LAB — HEPATIC FUNCTION PANEL
ALT: 139 U/L — ABNORMAL HIGH (ref 0–44)
AST: 120 U/L — ABNORMAL HIGH (ref 15–41)
Albumin: 2.1 g/dL — ABNORMAL LOW (ref 3.5–5.0)
Alkaline Phosphatase: 185 U/L — ABNORMAL HIGH (ref 38–126)
Bilirubin, Direct: 9.4 mg/dL — ABNORMAL HIGH (ref 0.0–0.2)
Indirect Bilirubin: 5.4 mg/dL — ABNORMAL HIGH (ref 0.3–0.9)
Total Bilirubin: 14.8 mg/dL — ABNORMAL HIGH (ref 0.0–1.2)
Total Protein: 7 g/dL (ref 6.5–8.1)

## 2023-07-14 LAB — RENAL FUNCTION PANEL
Albumin: 1.7 g/dL — ABNORMAL LOW (ref 3.5–5.0)
Anion gap: 14 (ref 5–15)
BUN: 69 mg/dL — ABNORMAL HIGH (ref 6–20)
CO2: 17 mmol/L — ABNORMAL LOW (ref 22–32)
Calcium: 7.3 mg/dL — ABNORMAL LOW (ref 8.9–10.3)
Chloride: 103 mmol/L (ref 98–111)
Creatinine, Ser: 4.53 mg/dL — ABNORMAL HIGH (ref 0.61–1.24)
GFR, Estimated: 14 mL/min — ABNORMAL LOW (ref >60)
Glucose, Bld: 190 mg/dL — ABNORMAL HIGH (ref 70–99)
Phosphorus: 4 mg/dL (ref 2.5–4.6)
Potassium: 3.7 mmol/L (ref 3.5–5.1)
Sodium: 134 mmol/L — ABNORMAL LOW (ref 135–145)

## 2023-07-14 LAB — PROTIME-INR
INR: 1.7 — ABNORMAL HIGH (ref 0.8–1.2)
Prothrombin Time: 20.2 s — ABNORMAL HIGH (ref 11.4–15.2)

## 2023-07-14 LAB — MAGNESIUM
Magnesium: 2.6 mg/dL — ABNORMAL HIGH (ref 1.7–2.4)
Magnesium: 2.5 mg/dL — ABNORMAL HIGH (ref 1.7–2.4)
Magnesium: 2.6 mg/dL — ABNORMAL HIGH (ref 1.7–2.4)

## 2023-07-14 LAB — GLUCOSE, CAPILLARY
Glucose-Capillary: 121 mg/dL — ABNORMAL HIGH (ref 70–99)
Glucose-Capillary: 147 mg/dL — ABNORMAL HIGH (ref 70–99)
Glucose-Capillary: 184 mg/dL — ABNORMAL HIGH (ref 70–99)
Glucose-Capillary: 200 mg/dL — ABNORMAL HIGH (ref 70–99)
Glucose-Capillary: 201 mg/dL — ABNORMAL HIGH (ref 70–99)
Glucose-Capillary: 202 mg/dL — ABNORMAL HIGH (ref 70–99)

## 2023-07-14 LAB — LIPASE, BLOOD: Lipase: 302 U/L — ABNORMAL HIGH (ref 11–51)

## 2023-07-14 LAB — PHOSPHORUS
Phosphorus: 3.8 mg/dL (ref 2.5–4.6)
Phosphorus: 3.6 mg/dL (ref 2.5–4.6)

## 2023-07-14 LAB — HEMOGLOBIN A1C
Hgb A1c MFr Bld: 4.9 % (ref 4.8–5.6)
Mean Plasma Glucose: 93.93 mg/dL

## 2023-07-14 MED ORDER — PIPERACILLIN-TAZOBACTAM IN DEX 2-0.25 GM/50ML IV SOLN
2.2500 g | Freq: Four times a day (QID) | INTRAVENOUS | Status: DC
  Administered 2023-07-14 – 2023-07-15 (×3): 2.25 g via INTRAVENOUS
  Filled 2023-07-14 (×4): qty 50

## 2023-07-14 MED ORDER — INSULIN ASPART 100 UNIT/ML IJ SOLN: 0.0000 [IU] | Freq: Three times a day (TID) | INTRAMUSCULAR | Status: DC

## 2023-07-14 MED ORDER — ALBUMIN HUMAN 25 % IV SOLN
25.0000 g | Freq: Four times a day (QID) | INTRAVENOUS | Status: AC
Start: 2023-07-14 — End: 2023-07-15
  Administered 2023-07-14 – 2023-07-15 (×4): 25 g via INTRAVENOUS
  Filled 2023-07-14 (×4): qty 100

## 2023-07-14 MED ORDER — SODIUM CHLORIDE 0.9 % IV BOLUS
1000.0000 mL | Freq: Once | INTRAVENOUS | Status: AC
  Administered 2023-07-14: 1000 mL via INTRAVENOUS

## 2023-07-14 MED ORDER — VASOPRESSIN 20 UNITS/100 ML INFUSION FOR SHOCK
0.0000 [IU]/min | INTRAVENOUS | Status: DC
  Administered 2023-07-14: 0.03 [IU]/min via INTRAVENOUS
  Filled 2023-07-14 (×2): qty 100

## 2023-07-14 NOTE — Progress Notes (Incomplete)
 [facility] PROGRESS NOTE    Progress Note  [name] [name]  EAV:[id number] DOB: 05/05/1965 DOA: 07/05/2023 PCP: Benetta Spar, MD     Brief Narrative:   [name] [name] is an 58 y.o. male past medical history of essential hypertension COPD alcoholic liver cirrhosis, hemosiderosis, chronic kidney see stage IIIb left lower extremity DVT const: For 4 days of abdominal pain, nausea vomiting and diarrhea CT scan of the abdomen pelvis showed diffuse ill-defined pancreatitis, hospital stay was complicated by acute kidney injury with oliguria temporary HD catheter was placed and nephrology was consulted for CRRT.  Nephrology and gastroenterology are on board.  Developed probably circulatory shock eventually required 3 pressors intubated on 07/08/2022 and stress dose steroids.  Significant Events: Hemodialysis 07/06/2023 (temporary HD line placed) s/p short HD, tx to [facility] 3/8 worsening shock, pressors, CTH/ CT a/p 3/9 intubated. On 3 pressors. Renal fxn and lactic acidosis worse. Started CRRT 3/10 LFTs a little better. Still on 3 pressors. Comfortable efforts on PSV but mental status not amendable to extubation. ECHO EF 55% no WMA, grade I diastolic dysfxn, started stress dose steroids. Right internal jugular CVL placed. The epi was weaned off over course of am after stress dose steroids started, NE weaning stopped fent gtt changed to PRN. D10 rate reduced to 10cc/hr. Acetylcysteine completed  3/11 vasopressin discontinued on very low-dose norepinephrine starting to wean sedation further to assess for readiness to wean ventilation but not responding and appeared to have left gaze pref after sedation off for prolonged time. CT head obtained no acute findings. Abx switched to zosyn 3/12 woke up during evening. Agitated at times. Attempting to get OOB. Still seeming to have left sided weakness but exam much closer to that of the 10th. Norepi off.  Extubated.  Taken off CRRT, encephalopathic but tolerated  extubation 3/13 agitated during the evening hours, hypertensive and tachycardic moaning.  Started on Precedex.  This was discontinued on a.m. rounds.  Remains off pressors remains off dialysis minimal urine output.  Had a goals of care discussion with palliative and critical care.  Critical care recommending DO NOT RESUSCITATE, also shared with family patient's appropriateness for hospice going forward.  Family taking CODE STATUS into advisement with plan to follow-up.  Assessment/Plan:   Alcoholic pancreatitis with circulatory shock with adrenal insufficiency: Now off pressors all cultures negative. Being tapered down on stress dose steroids. Core track was placed and now on tube feedings. He has been transition to oral midodrine.  Decompensated alcoholic cirrhosis likely complicated by shock with liver associated coagulopathy and thrombocytopenia: LFTs peak of 5000 which have normalized, question shock liver. PT/INR this morning is 20/1.7 which is improved. Platelets around 48 which have stabilized. Creatinine SCDs for DVT prophylaxis.  Right lower lobe atelectasis/aspiration pneumonia in setting of history of COPD: Successfully extubated on 07/11/2022 now satting on room air. He was started empirically on antibiotics Active Problems:   Alcoholic cirrhosis (HCC)   Shock liver   Need for acute hemodialysis (HCC)   Acute biliary pancreatitis   Coagulopathy (HCC)   Somnolence   Septic shock (HCC)   Elevated liver enzymes   Palliative care by specialist   Goals of care, counseling/discussion   Protein-calorie malnutrition, severe  RN Pressure Injury Documentation:    Estimated body mass index is 24.25 kg/m as calculated from the following:   Height as of this encounter: 6' (1.829 m).   Weight as of this encounter: 81.1 kg. Malnutrition Type:  Nutrition Problem: Severe Malnutrition Etiology:  chronic illness (ETOH use, COPD, liver cirrhosis)   Malnutrition  Characteristics:  Signs/Symptoms: severe fat depletion, severe muscle depletion   Nutrition Interventions:  Interventions: Refer to RD note for recommendations    DVT prophylaxis: *** Family Communication:*** Status is: Inpatient {Inpatient:23812}    Code Status:     Code Status Orders  (From admission, onward)           Start     Ordered   07/12/23 1619  Do not attempt resuscitation (DNR) Pre-Arrest Interventions Desired  (Code Status)  Continuous       Question Answer Comment  If pulseless and not breathing No CPR or chest compressions.   In Pre-Arrest Conditions (Patient Has Pulse and Is Breathing) May intubate, use advanced airway interventions and cardioversion/ACLS medications if appropriate or indicated. May transfer to ICU.   Consent: Discussion documented in EHR or advanced directives reviewed      07/12/23 1618           Code Status History     Date Active Date Inactive Code Status Order ID Comments User Context   07/06/2023 2158 07/12/2023 1618 Full Code [id number]  Marylu Lund Inpatient   07/05/2023 1312 07/06/2023 2158 Full Code [id number]  Erick Blinks, DO ED   03/12/2023 0241 03/14/2023 1553 Full Code [id number]  Zierle-Ghosh, Asia B, DO Inpatient   09/23/2022 0352 09/25/2022 1624 Full Code [id number]  Lilyan Gilford, DO ED   05/19/2022 2232 05/24/2022 1733 Full Code [id number]  Zierle-Ghosh, Asia B, DO ED   03/02/2022 1558 03/04/2022 1717 Full Code [id number]  Erick Blinks, DO Inpatient   02/09/2021 2224 02/10/2021 2220 Full Code [id number]  Elgergawy, Leana Roe, MD Inpatient   08/05/2020 1619 08/11/2020 2231 Full Code [id number]  Shon Hale, MD Inpatient   01/06/2013 2358 01/09/2013 1651 Full Code [id number]  Vania Rea, MD Inpatient         IV Access:   Peripheral IV   Procedures and diagnostic studies:   DG Abd Portable 1V Result Date: 07/13/2023 CLINICAL DATA:  Feeding tube placement EXAM: PORTABLE ABDOMEN - 1 VIEW COMPARISON:   07/11/2023 FINDINGS: Soft feeding tube enters the stomach, passes to the fundus and has its tip in the mid body. Gas present throughout much of the small bowel which could be due to ileus or partial small bowel obstruction. IMPRESSION: Soft feeding tube tip in the mid body of the stomach. Increase in small bowel air which could be due to ileus or partial small bowel obstruction. Electronically Signed   By: Paulina Fusi M.D.   On: 07/13/2023 16:38     Medical Consultants:   None.   Subjective:    [name] [name]   Objective:    Vitals:   07/14/23 0515 07/14/23 0530 07/14/23 0545 07/14/23 0600  BP: 120/61 (!) 94/51 (!) 87/53 (!) 91/50  Pulse: (!) 125 (!) 119 (!) 115 (!) 115  Resp: 12 20 (!) 21 19  Temp:      TempSrc:      SpO2: 97% 97% 97% 97%  Weight:      Height:       SpO2: 97 % O2 Flow Rate (L/min): 2 L/min FiO2 (%): 40 %   Intake/Output Summary (Last 24 hours) at 07/14/2023 0614 Last data filed at 07/14/2023 0001 Gross per 24 hour  Intake 340.68 ml  Output 170 ml  Net 170.68 ml   Filed Weights   07/12/23 0500 07/13/23 0500 07/14/23 0500  Weight: 78.6 kg  80.3 kg 81.1 kg    Exam: General exam: In no acute distress. Respiratory system: Good air movement and clear to auscultation. Cardiovascular system: S1 & S2 heard, RRR. No JVD, murmurs, rubs, gallops or clicks.  Gastrointestinal system: Abdomen is nondistended, soft and nontender.  Central nervous system: Alert and oriented. No focal neurological deficits. Extremities: No pedal edema. Skin: No rashes, lesions or ulcers Psychiatry: Judgement and insight appear normal. Mood & affect appropriate.    Data Reviewed:    Labs: Basic Metabolic Panel: Recent Labs  Lab 07/11/23 0346 07/11/23 0350 07/11/23 0744 07/12/23 0325 07/12/23 0800 07/12/23 1800 07/13/23 0547 07/13/23 0943 07/13/23 1800 07/14/23 0230  NA 135 133*  --  135  --   --  132*  --   --  134*  K 4.0 3.9  --  3.7  --   --  3.4*  --   --  3.7   CL 103 100  --  103  --   --  102  --   --  103  CO2 24 21*  --  21*  --   --  20*  --   --  17*  GLUCOSE 120* 121*  --  134*  --   --  133*  --   --  190*  BUN 12 11  --  29*  --   --  52*  --   --  69*  CREATININE 1.35* 1.38*  --  2.30*  --   --  3.35*  --   --  4.53*  CALCIUM 7.6* 7.3*  --  7.2*  --   --  7.3*  --   --  7.3*  MG 2.3  --    < > 2.4   < > 2.4 2.6* 2.4 2.5* 2.6*  PHOS  --  2.6   < > 2.8  --  3.6 3.4  3.5  --  4.2 4.0  3.8   < > = values in this interval not displayed.   GFR Estimated Creatinine Clearance: 19.5 mL/min (A) (by C-G formula based on SCr of 4.53 mg/dL (H)). Liver Function Tests: Recent Labs  Lab 07/09/23 0307 07/09/23 1600 07/10/23 0346 07/10/23 0944 07/11/23 0346 07/11/23 0350 07/12/23 0325 07/12/23 0800 07/13/23 0547 07/14/23 0230  AST 1,231*  --  463*  --  269*  --   --  223* 161*  --   ALT 920*  --  653*  --  424*  --   --  264* 224*  --   ALKPHOS 48  --  62  --  72  --   --  122 167*  --   BILITOT 7.7*  --  8.1*  --  8.6*  --   --  10.9* 13.3*  --   PROT 5.6*  --  6.1*  --  6.3*  --   --  6.1* 6.8  --   ALBUMIN 2.6*  2.6*   < > 2.3*   < > 2.1* 2.1* 1.8* 1.8* 1.8*  1.8* 1.7*   < > = values in this interval not displayed.   Recent Labs  Lab 07/08/23 0816 07/10/23 0346 07/12/23 0800 07/13/23 0547  LIPASE 328* 44 101* 150*   Recent Labs  Lab 07/07/23 0830  AMMONIA 60*   Coagulation profile Recent Labs  Lab 07/10/23 0346 07/11/23 0346 07/12/23 0325 07/13/23 0547 07/14/23 0230  INR 2.5* 2.0* 2.1* 1.9* 1.7*   COVID-19 Labs  No results for input(s): "  DDIMER", "FERRITIN", "LDH", "CRP" in the last 72 hours.  Lab Results  Component Value Date   SARSCOV2NAA NEGATIVE 05/19/2022   SARSCOV2NAA NEGATIVE 02/09/2021   SARSCOV2NAA NEGATIVE 08/05/2020   SARSCOV2NAA NEGATIVE 07/20/2020    CBC: Recent Labs  Lab 07/09/23 0307 07/10/23 0346 07/11/23 0346 07/12/23 0800 07/13/23 0547  WBC 13.5* 19.0* 17.0* 18.5* 21.6*  HGB  8.6* 9.6* 9.3* 9.6* 10.5*  HCT 25.4* 28.0* 27.3* 28.1* 29.9*  MCV 87.3 86.7 86.9 86.5 84.7  PLT 104* 49* 41* 42* 48*   Cardiac Enzymes: No results for input(s): "CKTOTAL", "CKMB", "CKMBINDEX", "TROPONINI" in the last 168 hours. BNP (last 3 results) No results for input(s): "PROBNP" in the last 8760 hours. CBG: Recent Labs  Lab 07/13/23 1115 07/13/23 1515 07/13/23 1942 07/14/23 0002 07/14/23 0342  GLUCAP 93 97 120* 147* 184*   D-Dimer: No results for input(s): "DDIMER" in the last 72 hours. Hgb A1c: No results for input(s): "HGBA1C" in the last 72 hours. Lipid Profile: No results for input(s): "CHOL", "HDL", "LDLCALC", "TRIG", "CHOLHDL", "LDLDIRECT" in the last 72 hours. Thyroid function studies: No results for input(s): "TSH", "T4TOTAL", "T3FREE", "THYROIDAB" in the last 72 hours.  Invalid input(s): "FREET3" Anemia work up: No results for input(s): "VITAMINB12", "FOLATE", "FERRITIN", "TIBC", "IRON", "RETICCTPCT" in the last 72 hours. Sepsis Labs: Recent Labs  Lab 07/07/23 0830 07/07/23 1145 07/08/23 0423 07/09/23 0308 07/09/23 1933 07/10/23 0346 07/11/23 0346 07/12/23 0800 07/13/23 0547  WBC  --   --    < >  --   --  19.0* 17.0* 18.5* 21.6*  LATICACIDVEN >9.0* >9.0*  --  3.4* 2.3*  --   --   --   --    < > = values in this interval not displayed.   Microbiology Recent Results (from the past 240 hours)  Culture, blood (Routine X 2) w Reflex to ID Panel     Status: None   Collection Time: 07/06/23 10:01 AM   Specimen: BLOOD  Result Value Ref Range Status   Specimen Description BLOOD RIGHT HAND  Final   Special Requests   Final    BOTTLES DRAWN AEROBIC AND ANAEROBIC Blood Culture results may not be optimal due to an inadequate volume of blood received in culture bottles   Culture   Final    NO GROWTH 5 DAYS Performed at [facility], [address], [place], [place] [address]    Report Status 07/11/2023 FINAL  Final  Culture, blood (Routine X 2) w Reflex to ID  Panel     Status: None   Collection Time: 07/06/23 10:22 AM   Specimen: BLOOD  Result Value Ref Range Status   Specimen Description BLOOD RIGHT ASSIST CONTROL  Final   Special Requests   Final    BOTTLES DRAWN AEROBIC AND ANAEROBIC Blood Culture adequate volume   Culture   Final    NO GROWTH 5 DAYS Performed at [facility], [address] [place], [place] [address]    Report Status 07/11/2023 FINAL  Final  MRSA Next Gen by PCR, Nasal     Status: None   Collection Time: 07/06/23  8:17 PM   Specimen: Nasal Mucosa; Nasal Swab  Result Value Ref Range Status   MRSA by PCR Next Gen NOT DETECTED NOT DETECTED Final    Comment: (NOTE) The GeneXpert MRSA Assay (FDA approved for NASAL specimens only), is one component of a comprehensive MRSA colonization surveillance program. It is not intended to diagnose MRSA infection nor to guide or  monitor treatment for MRSA infections. Test performance is not FDA approved in patients less than 59 years old. Performed at [facility], [address] [place], [place] [address]   Culture, Respiratory w Gram Stain     Status: None   Collection Time: 07/08/23  5:16 PM   Specimen: Tracheal Aspirate; Respiratory  Result Value Ref Range Status   Specimen Description TRACHEAL ASPIRATE  Final   Special Requests NONE  Final   Gram Stain   Final    FEW WBC PRESENT, PREDOMINANTLY PMN RARE GRAM NEGATIVE RODS    Culture   Final    RARE Normal respiratory flora-no Staph aureus or Pseudomonas seen Performed at [facility], [address], [place], [place] [address]    Report Status 07/10/2023 FINAL  Final     Medications:    arformoterol  15 mcg Nebulization BID   budesonide (PULMICORT) nebulizer solution  0.5 mg Nebulization BID   Chlorhexidine Gluconate Cloth  6 each Topical Q0600   Chlorhexidine Gluconate Cloth  6 each Topical Q0600   feeding supplement (PROSource TF20)  60 mL Per Tube Daily   folic acid  1 mg Per Tube Daily    furosemide  40 mg Intravenous Q12H   lactulose  10 g Per Tube BID   midodrine  10 mg Per Tube Q8H   nicotine  14 mg Transdermal Daily   mouth rinse  15 mL Mouth Rinse 4 times per day   oxyCODONE  5 mg Per Tube Q6H   pantoprazole (PROTONIX) IV  40 mg Intravenous Q24H   QUEtiapine  50 mg Per Tube BID   thiamine  100 mg Per Tube Daily   Continuous Infusions:  dextrose Stopped (07/12/23 1614)   feeding supplement (VITAL 1.5 CAL) 40 mL/hr at 07/14/23 0013   norepinephrine (LEVOPHED) Adult infusion 13 mcg/min (07/14/23 0011)      LOS: 9 days   Marinda Elk  [facility]  07/14/2023, 6:14 AM

## 2023-07-14 NOTE — Progress Notes (Signed)
 NAME:  [name] [name], MRN:  [id number], DOB:  1965/12/19, LOS: 9 ADMISSION DATE:  07/05/2023, CONSULTATION DATE:  07/06/2023 REFERRING MD:  Randye Lobo, CHIEF COMPLAINT:  Abdominal Pain   History of Present Illness:  [name] [name] is a 58 year old gentleman with a known medical history significant for hypertension, COPD, alcoholic liver cirrhosis, hemosiderosis, CKD 3B, dyslipidemia, prior left lower extremity DVT presenting to [facility] ED secondary to 3 to 4-day complaint of abdominal pain.  Patient describes bilateral flank type pain increasing in intensity without radiation to groin chest or back.  He states this is similar pain to his previous admission for pancreatitis.  Patient does elicit nausea and vomiting and diarrhea.   It is noted that the patient does have history of cholelithiasis and decompensated cirrhosis.  Patient reports he has not had any alcohol since his birthday on [birth year], however according to GI note he made comments that his last drink was on this past Sunday.  CT of the abdomen pelvis without contrast performed on July 05, 2023 reveals: IMPRESSION: 1. Pancreas is diffusely ill-defined through the tail region with subtle peripancreatic edema/inflammation. Imaging features compatible with acute pancreatitis. Pancreatic protocol CT recommended if renal function permits. If not, MRI of the abdomen with and without contrast recommended as pancreatic mass lesion cannot be excluded. 2. Hepatic cirrhosis with hepatic steatosis. 3. Cholelithiasis. 4. Left colonic diverticulosis without diverticulitis. 5. Atrophic left kidney.  Abdomen Limited ultrasound performed today 07/06/2023 reveals no significant abdominal ascites.  Laboratory indices: He does have elevation in coagulation studies with a PT of 26 on yesterday that elevated to 50.4 today 3 10/31/2023 and INR at admission yesterday 2.4 elevated to 5.5 today.  CBC: WBC 15.3, hemoglobin 11, hematocrit 36.7, platelets 164.   BMP sodium 132, potassium 5.8, chloride 99, CO2 14 BUN 40, creatinine 4.81 and glucose 214.  LFTs: Lipase 2929, AST 5021, ALT 05/08/2007.  Secondary to patient's known CKD presenting now with AKI and presence of oliguria a temporary HD line was placed by surgery team today for hemodialysis.  Patient underwent 2-hour HD in the ED prior to transfer.  Patient did receive IV fluids for pancreatitis management, as he is a poor candidate for liver transplant due to ongoing alcohol use per GI note.  Consults: Patient has been followed by nephrology and gastrointestinal services Blood cultures sent 07/06/2023 secondary to leukocytosis.  As such patient was transferred to [facility] for ICU monitoring and management and admitted with acute on chronic liver failure/pancreatitis.  Pertinent  Medical History  Asthma, B12 deficiency, cirrhosis, dyspnea, hyperlipidemia, history of kidney stones, hypertension, kidney stones, COPD, prior lower left extremity DVT, chronic anemia.  Significant Hospital Events: Including procedures, antibiotic start and stop dates in addition to other pertinent events   3/7 Hemodialysis  (temporary HD line placed) s/p short HD, tx to [facility] 3/8 worsening shock, pressors, CTH/ CT a/p 3/9 intubated. On 3 pressors. Renal fxn and lactic acidosis worse. Started CRRT 3/10 LFTs a little better. Still on 3 pressors. Comfortable efforts on PSV but mental status not amendable to extubation. ECHO EF 55% no WMA, grade I diastolic dysfxn, started stress dose steroids. Right internal jugular CVL placed. The epi was weaned off over course of am after stress dose steroids started, NE weaning stopped fent gtt changed to PRN. D10 rate reduced to 10cc/hr. Acetylcysteine completed  3/11 vasopressin discontinued on very low-dose norepinephrine starting to wean sedation further to assess for readiness to wean ventilation but not responding and  appeared to have left gaze pref after sedation off for prolonged  time. CT head obtained no acute findings. Abx switched to zosyn 3/12 woke up during evening. Agitated at times. Attempting to get OOB. Still seeming to have left sided weakness but exam much closer to that of the 10th. Norepi off.  Extubated.  Taken off CRRT, encephalopathic but tolerated extubation 3/13 agitated during the evening hours, hypertensive and tachycardic moaning.  Started on Precedex.  This was discontinued on a.m. rounds.  Remains off pressors remains off dialysis minimal urine output.  Had a goals of care discussion with palliative and critical care.  Critical care recommending DO NOT RESUSCITATE, also shared with family patient's appropriateness for hospice going forward.  Family taking CODE STATUS into advisement with plan to follow-up 3/14 Patient was agitated overnight, pulled out his core track. Off vasopressors 3/15 Back on pressors including levo and vaso   Interim History / Subjective:  Alert and able to follow commands, altered   Objective   Blood pressure (!) 90/47, pulse (!) 120, temperature 98 F (36.7 C), temperature source Oral, resp. rate (!) 24, height 6' (1.829 m), weight 81.1 kg, SpO2 97%.        Intake/Output Summary (Last 24 hours) at 07/14/2023 1110 Last data filed at 07/14/2023 1000 Gross per 24 hour  Intake 2071.44 ml  Output 140 ml  Net 1931.44 ml   Filed Weights   07/12/23 0500 07/13/23 0500 07/14/23 0500  Weight: 78.6 kg 80.3 kg 81.1 kg    Examination: General: Acute on chronically ill appearing deconditioned middle aged male lying in bed, in NAD HEENT: [place]/AT, MM pink/moist, PERRL,  Neuro: Alert to self only, able to follow simple commands,  CV: s1s2 regular rate and rhythm, no murmur, rubs, or gallops,  PULM: On RA diminished bilaterally, no increased work of breathing, no added breath sound GI: soft, bowel sounds active in all 4 quadrants, non-tender, non-distended, tolerating TF Extremities: warm/dry, 2+ upper thigh and lower abdominal edema   Skin: no rashes or lesions  Resolved Hospital Problem list   Lactic acid Acute hypoxic respiratory failure resolved extubated 3/12 Severe sepsis with septic shock, POA now resolved Aspiration pneumonia -Completed 7 days of IV Zosyn 3/14  Assessment & Plan:   Shock -Appears patient had shown slow improvement with ability to discontinue vasopressors and stress dose steroids over the last 2.  However unfortunately overnight 3/15 blood pressure precipitously dropped requiring resumption of vasopressor support now requiring 15 of Levophed and vasopressin -S/p 7 days of IV Zosyn for presumed aspiration pneumonia P: Resume antibiotic therapy given worsening shock state and leukocytosis  Continue vasopressors for MAP goal greater than 65 Resume stress dose steroids Check CT chest, abdomen, pelvis   Acute on chronic pancreatitis secondary to gallstone versus alcohol use -Lipase downtrending but remains elevated -CT worsening acute pancreatitis, posterior mediastinal mass, inflammation of small and large bowel (likely hepatic coagulopathy) MRCP was ordered and pending once stable. 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 History of pancreatic divisum on MRCP Acute on chronic liver failure Alcoholic cirrhosis -MELD 35 Coagulopathy of liver disease P: GI evaluated during admission, may need to reengage Continue vasopressor support Consider obtaining MRCP Stress dose steroids stopped 3/13 at 1000, resume as above   Thrombocytopenia of liver disease and critical illness P: Trend CBC Transfuse per protocol Hemoglobin goal greater than 7 Platelet goal greater than 10  Acute encephalopathy, combined metabolic and hepatic, improving P: Maintain neuro protective measures; goal for eurothermia, euglycemia, eunatermia, normoxia, and PCO2 goal  of 35-40 Nutrition and bowel regiment  Aspirations precautions  Delirium precautions  AKI on CKD stage IIIb, was on CRRT which was stopped.  Due for IHD today -CRRT utilized 3/9  to 3/12 P: Initial plan was to attempt to IHD 3/15 however patient decompensated hemodynamically overnight and is now on high-dose vasopressor support Nephrology trialing Lasix this a.m. Nephrology following, appreciate assistance Monitor urine output Patient is a poor candidate for long-term dialysis  Schedule albumin  Anemia of critical illness P: Trend CBC Transfuse per protocol Hemoglobin goal greater than 7  Recurrent hypoglycemia now with progressively hyperglycemia P: Start very sensitive SSI Closely monitor CBG  Possible mediastinal mass P: Supportive care, once medically optimized can consider outpatient evaluation  Severe protein calorie malnutrition P: Continue tube feeds  Goals of care Patient met with palliative care 3/14 with decision made to change CODE STATUS to DO NOT RESUSCITATE but continue all other aggressive interventions  Best Practice (right click and "Reselect all SmartList Selections" daily)   Diet: Cotrak today then tube feeds DVT prophylaxis prophylactic heparin  Pressure ulcer(s): N/A GI prophylaxis: PPI Lines: Discontinue arterial line, central line.  HD catheter needs to be in for now Foley: Discontinue Code Status: DNR Last date of multidisciplinary goals of care discussion [palliative care is following]   Uriyah Raska D. Harris, NP-C [facility] Personal contact information can be found on Amion  If no contact or response made please call [phone] 07/14/2023, 12:19 PM

## 2023-07-14 NOTE — Progress Notes (Signed)
 eLink Physician-Brief Progress Note Patient Name: [name] [name] DOB: 1966-02-17 MRN: [id number]   Date of Service  07/14/2023  HPI/Events of Note  Tube feed held due to concern for bleed CT done without bleed noted  eICU Interventions  May resume feeding as previously ordered     Intervention Category Intermediate Interventions: Other:  Darl Pikes 07/14/2023, 10:18 PM

## 2023-07-14 NOTE — Plan of Care (Signed)
  Problem: Clinical Measurements: Goal: Respiratory complications will improve Outcome: Progressing   Problem: Education: Goal: Knowledge of General Education information will improve Description: Including pain rating scale, medication(s)/side effects and non-pharmacologic comfort measures Outcome: Not Progressing

## 2023-07-14 NOTE — Plan of Care (Signed)
   Problem: Coping: Goal: Level of anxiety will decrease Outcome: Progressing   Problem: Pain Managment: Goal: General experience of comfort will improve and/or be controlled Outcome: Progressing

## 2023-07-14 NOTE — Progress Notes (Signed)
  [facility] PROGRESS NOTE  Assessment/ Plan: Pt is a 58 y.o. 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   # Dialysis dependent anuric AKI on CKD3b likely ischemic ATN, started HD 07/06/2023 using femoral temp HD catheter placed by surgery at [facility]. - CRRT 3/9-3/12.  Remains extubated, more alert but confused.  Not much urine output.  BUN and crt rising daily  off of CRRT .   Plan was for HD today but now that pressors have been resumed dont think we can do.  I started some lasix to see if can induce some UOP- not much impact will stop.  Noted palliative care discussion about goals of care-  no decisions made as of yet re dialysis .  Is more hypotensive but also with sinus tach-  has some edema but could be third spacing-  I want to give him a fluid bolus to see if his hemodynamics improve,  If they do we might be able to do IHD, if they dont will need to resume CRRT-  I feel like we can wait another 24 hours to decide.    I agree that he is likely a poor candidate for long term HD-  MELD gives a high level of mortality and with his confusion is not idea-  he is looking at SNF and dialysis and a poor quality of life    # Septic shock: Off of pressors yesterday but had to be resumed.    #Severe gallstone pancreatitis with transaminitis: Per critical care.  #Anion gap metabolic acidosis/lactic acidosis: Improving.  # Hyperkalemia resolved  Anemia-  not a major issue   Subjective:  Urine output is recorded only 140 cc-  BUN and crt climbing.  Is alert but has been agitated-  given haldol.  Pressors had to be resumed yesterday-  titrating up quickly -  Palliative has engaged with family -  is DNR but no other limits of care have been placed   Objective Vital signs in last 24 hours: Vitals:   07/14/23 0515 07/14/23 0530 07/14/23 0545  07/14/23 0600  BP: 120/61 (!) 94/51 (!) 87/53 (!) 91/50  Pulse: (!) 125 (!) 119 (!) 115 (!) 115  Resp: 12 20 (!) 21 19  Temp:      TempSrc:      SpO2: 97% 97% 97% 97%  Weight:      Height:       Weight change: 0.8 kg  Intake/Output Summary (Last 24 hours) at 07/14/2023 0804 Last data filed at 07/14/2023 0650 Gross per 24 hour  Intake 759.44 ml  Output 140 ml  Net 619.44 ml       Labs: RENAL PANEL Recent Labs  Lab 07/11/23 0346 07/11/23 0350 07/11/23 0744 07/12/23 0325 07/12/23 0800 07/12/23 1800 07/13/23 0547 07/13/23 0943 07/13/23 1800 07/14/23 0230  NA 135 133*  --  135  --   --  132*  --   --  134*  K 4.0 3.9  --  3.7  --   --  3.4*  --   --  3.7  CL 103 100  --  103  --   --  102  --   --  103  CO2 24 21*  --  21*  --   --  20*  --   --  17*  GLUCOSE 120* 121*  --  134*  --   --  133*  --   --  190*  BUN 12 11  --  29*  --   --  52*  --   --  69*  CREATININE 1.35* 1.38*  --  2.30*  --   --  3.35*  --   --  4.53*  CALCIUM 7.6* 7.3*  --  7.2*  --   --  7.3*  --   --  7.3*  MG 2.3  --    < > 2.4 2.5* 2.4 2.6* 2.4 2.5* 2.6*  PHOS  --  2.6   < > 2.8  --  3.6 3.4  3.5  --  4.2 4.0  3.8  ALBUMIN 2.1* 2.1*  --  1.8* 1.8*  --  1.8*  1.8*  --   --  1.7*   < > = values in this interval not displayed.    Liver Function Tests: Recent Labs  Lab 07/11/23 0346 07/11/23 0350 07/12/23 0800 07/13/23 0547 07/14/23 0230  AST 269*  --  223* 161*  --   ALT 424*  --  264* 224*  --   ALKPHOS 72  --  122 167*  --   BILITOT 8.6*  --  10.9* 13.3*  --   PROT 6.3*  --  6.1* 6.8  --   ALBUMIN 2.1*   < > 1.8* 1.8*  1.8* 1.7*   < > = values in this interval not displayed.   Recent Labs  Lab 07/10/23 0346 07/12/23 0800 07/13/23 0547  LIPASE 44 101* 150*   Recent Labs  Lab 07/07/23 0830  AMMONIA 60*   CBC: Recent Labs    09/22/22 0926 09/22/22 0927 09/22/22 1003 09/29/22 1052 12/02/22 2225 03/19/23 1359 03/19/23 1400 03/26/23 0954 06/25/23 1008  07/05/23 0615 07/09/23 0307 07/10/23 0346 07/11/23 0346 07/12/23 0800 07/13/23 0547  HGB 12.9*  --    < >  --    < > 10.1*  --    < >  --    < > 8.6* 9.6* 9.3* 9.6* 10.5*  MCV 90.7  --    < >  --    < > 92.2  --    < >  --    < > 87.3 86.7 86.9 86.5 84.7  VITAMINB12  --  687  --   --   --   --  1,053*  --   --   --   --   --   --   --   --   FOLATE 13.2  --   --   --   --  6.3  --   --   --   --   --   --   --   --   --   FERRITIN  --  217  --  210  --   --  561*  --  218  --   --   --   --   --   --   TIBC  --  273  --  256  --   --  175*  --  224*  --   --   --   --   --   --   IRON  --  41*  --  61  --   --  27*  --  50  --   --   --   --   --   --    < > = values in this interval  not displayed.    Cardiac Enzymes: No results for input(s): "CKTOTAL", "CKMB", "CKMBINDEX", "TROPONINI" in the last 168 hours. CBG: Recent Labs  Lab 07/13/23 1115 07/13/23 1515 07/13/23 1942 07/14/23 0002 07/14/23 0342  GLUCAP 93 97 120* 147* 184*    Iron Studies: No results for input(s): "IRON", "TIBC", "TRANSFERRIN", "FERRITIN" in the last 72 hours. Studies/Results: DG Abd Portable 1V Result Date: 07/13/2023 CLINICAL DATA:  Feeding tube placement EXAM: PORTABLE ABDOMEN - 1 VIEW COMPARISON:  07/11/2023 FINDINGS: Soft feeding tube enters the stomach, passes to the fundus and has its tip in the mid body. Gas present throughout much of the small bowel which could be due to ileus or partial small bowel obstruction. IMPRESSION: Soft feeding tube tip in the mid body of the stomach. Increase in small bowel air which could be due to ileus or partial small bowel obstruction. Electronically Signed   By: Paulina Fusi M.D.   On: 07/13/2023 16:38    Medications: Infusions:  dextrose Stopped (07/12/23 1614)   feeding supplement (VITAL 1.5 CAL) 40 mL/hr at 07/14/23 0650   norepinephrine (LEVOPHED) Adult infusion 16 mcg/min (07/14/23 0650)    Scheduled Medications:  arformoterol  15 mcg Nebulization BID    budesonide (PULMICORT) nebulizer solution  0.5 mg Nebulization BID   Chlorhexidine Gluconate Cloth  6 each Topical Q0600   Chlorhexidine Gluconate Cloth  6 each Topical Q0600   feeding supplement (PROSource TF20)  60 mL Per Tube Daily   folic acid  1 mg Per Tube Daily   furosemide  40 mg Intravenous Q12H   lactulose  10 g Per Tube BID   midodrine  10 mg Per Tube Q8H   nicotine  14 mg Transdermal Daily   mouth rinse  15 mL Mouth Rinse 4 times per day   oxyCODONE  5 mg Per Tube Q6H   pantoprazole (PROTONIX) IV  40 mg Intravenous Q24H   QUEtiapine  50 mg Per Tube BID   thiamine  100 mg Per Tube Daily    have reviewed scheduled and prn medications.  Physical Exam: General: Ill looking  Not in distress-  sounds like more alert but confused  Heart:RRR, s1s2 nl Lungs: Clear bilateral. Abdomen:soft, non-distended Extremities no leg edema  Dialysis Access: temp HD line- .placed on 3/7  Mischelle Reeg A Juniper Cobey 07/14/2023,8:04 AM  LOS: 9 days

## 2023-07-14 NOTE — Progress Notes (Signed)
 eLink Physician-Brief Progress Note Patient Name: [name] [name] DOB: 1966/01/28 MRN: [id number]   Date of Service  07/14/2023  HPI/Events of Note  Notified patient having some RLQ brusing, Bedside RN is concerned for a RP bleed. This is new, just noted an hour ago.   Has a CT abd already ordered for routine for another reason. Requesting to change to state.  No hemodynamic changes at this time  eICU Interventions  Changed request to stat as patient coagulopathic and is an risk for spontaneous bleeding     Intervention Category Intermediate Interventions: Diagnostic test evaluation;Bleeding - evaluation and treatment with blood products  Darl Pikes 07/14/2023, 7:58 PM

## 2023-07-15 DIAGNOSIS — K7200 Acute and subacute hepatic failure without coma: Secondary | ICD-10-CM | POA: Diagnosis not present

## 2023-07-15 DIAGNOSIS — F1090 Alcohol use, unspecified, uncomplicated: Secondary | ICD-10-CM | POA: Diagnosis not present

## 2023-07-15 DIAGNOSIS — K852 Alcohol induced acute pancreatitis without necrosis or infection: Secondary | ICD-10-CM | POA: Diagnosis not present

## 2023-07-15 DIAGNOSIS — K7031 Alcoholic cirrhosis of liver with ascites: Secondary | ICD-10-CM | POA: Diagnosis not present

## 2023-07-15 DIAGNOSIS — A419 Sepsis, unspecified organism: Secondary | ICD-10-CM | POA: Diagnosis not present

## 2023-07-15 DIAGNOSIS — R579 Shock, unspecified: Secondary | ICD-10-CM | POA: Diagnosis not present

## 2023-07-15 DIAGNOSIS — K851 Biliary acute pancreatitis without necrosis or infection: Secondary | ICD-10-CM | POA: Diagnosis not present

## 2023-07-15 DIAGNOSIS — G9341 Metabolic encephalopathy: Secondary | ICD-10-CM | POA: Diagnosis not present

## 2023-07-15 LAB — RENAL FUNCTION PANEL
Albumin: 2.5 g/dL — ABNORMAL LOW (ref 3.5–5.0)
Albumin: 2.6 g/dL — ABNORMAL LOW (ref 3.5–5.0)
Anion gap: 16 — ABNORMAL HIGH (ref 5–15)
Anion gap: 9 (ref 5–15)
BUN: 86 mg/dL — ABNORMAL HIGH (ref 6–20)
BUN: 71 mg/dL — ABNORMAL HIGH (ref 6–20)
CO2: 18 mmol/L — ABNORMAL LOW (ref 22–32)
CO2: 21 mmol/L — ABNORMAL LOW (ref 22–32)
Calcium: 7.6 mg/dL — ABNORMAL LOW (ref 8.9–10.3)
Calcium: 7.6 mg/dL — ABNORMAL LOW (ref 8.9–10.3)
Chloride: 100 mmol/L (ref 98–111)
Chloride: 104 mmol/L (ref 98–111)
Creatinine, Ser: 5.13 mg/dL — ABNORMAL HIGH (ref 0.61–1.24)
Creatinine, Ser: 4.1 mg/dL — ABNORMAL HIGH (ref 0.61–1.24)
GFR, Estimated: 12 mL/min — ABNORMAL LOW (ref >60)
GFR, Estimated: 16 mL/min — ABNORMAL LOW (ref >60)
Glucose, Bld: 235 mg/dL — ABNORMAL HIGH (ref 70–99)
Glucose, Bld: 165 mg/dL — ABNORMAL HIGH (ref 70–99)
Phosphorus: 4.4 mg/dL (ref 2.5–4.6)
Phosphorus: 3.7 mg/dL (ref 2.5–4.6)
Potassium: 3.6 mmol/L (ref 3.5–5.1)
Potassium: 3.3 mmol/L — ABNORMAL LOW (ref 3.5–5.1)
Sodium: 134 mmol/L — ABNORMAL LOW (ref 135–145)
Sodium: 134 mmol/L — ABNORMAL LOW (ref 135–145)

## 2023-07-15 LAB — COMPREHENSIVE METABOLIC PANEL
ALT: 109 U/L — ABNORMAL HIGH (ref 0–44)
AST: 91 U/L — ABNORMAL HIGH (ref 15–41)
Albumin: 2.5 g/dL — ABNORMAL LOW (ref 3.5–5.0)
Alkaline Phosphatase: 153 U/L — ABNORMAL HIGH (ref 38–126)
Anion gap: 15 (ref 5–15)
BUN: 87 mg/dL — ABNORMAL HIGH (ref 6–20)
CO2: 18 mmol/L — ABNORMAL LOW (ref 22–32)
Calcium: 7.5 mg/dL — ABNORMAL LOW (ref 8.9–10.3)
Chloride: 101 mmol/L (ref 98–111)
Creatinine, Ser: 5.16 mg/dL — ABNORMAL HIGH (ref 0.61–1.24)
GFR, Estimated: 12 mL/min — ABNORMAL LOW (ref >60)
Glucose, Bld: 237 mg/dL — ABNORMAL HIGH (ref 70–99)
Potassium: 3.6 mmol/L (ref 3.5–5.1)
Sodium: 134 mmol/L — ABNORMAL LOW (ref 135–145)
Total Bilirubin: 17.2 mg/dL — ABNORMAL HIGH (ref 0.0–1.2)
Total Protein: 6.9 g/dL (ref 6.5–8.1)

## 2023-07-15 LAB — GLUCOSE, CAPILLARY
Glucose-Capillary: 148 mg/dL — ABNORMAL HIGH (ref 70–99)
Glucose-Capillary: 152 mg/dL — ABNORMAL HIGH (ref 70–99)
Glucose-Capillary: 159 mg/dL — ABNORMAL HIGH (ref 70–99)
Glucose-Capillary: 193 mg/dL — ABNORMAL HIGH (ref 70–99)
Glucose-Capillary: 196 mg/dL — ABNORMAL HIGH (ref 70–99)
Glucose-Capillary: 222 mg/dL — ABNORMAL HIGH (ref 70–99)
Glucose-Capillary: 233 mg/dL — ABNORMAL HIGH (ref 70–99)

## 2023-07-15 LAB — CBC
HCT: 25.1 % — ABNORMAL LOW (ref 39.0–52.0)
Hemoglobin: 8.9 g/dL — ABNORMAL LOW (ref 13.0–17.0)
MCH: 30.3 pg (ref 26.0–34.0)
MCHC: 35.5 g/dL (ref 30.0–36.0)
MCV: 85.4 fL (ref 80.0–100.0)
Platelets: 74 10*3/uL — ABNORMAL LOW (ref 150–400)
RBC: 2.94 MIL/uL — ABNORMAL LOW (ref 4.22–5.81)
RDW: 17.4 % — ABNORMAL HIGH (ref 11.5–15.5)
WBC: 25.7 10*3/uL — ABNORMAL HIGH (ref 4.0–10.5)
nRBC: 0.2 % (ref 0.0–0.2)

## 2023-07-15 LAB — PHOSPHORUS
Phosphorus: 4.4 mg/dL (ref 2.5–4.6)
Phosphorus: 3.8 mg/dL (ref 2.5–4.6)

## 2023-07-15 LAB — MAGNESIUM: Magnesium: 2.6 mg/dL — ABNORMAL HIGH (ref 1.7–2.4)

## 2023-07-15 LAB — PROTIME-INR
INR: 2.1 — ABNORMAL HIGH (ref 0.8–1.2)
Prothrombin Time: 23.5 s — ABNORMAL HIGH (ref 11.4–15.2)

## 2023-07-15 MED ORDER — PRISMASOL BGK 4/2.5 32-4-2.5 MEQ/L EC SOLN: Status: DC

## 2023-07-15 MED ORDER — INSULIN ASPART 100 UNIT/ML IJ SOLN
0.0000 [IU] | INTRAMUSCULAR | Status: DC
  Administered 2023-07-15: 5 [IU] via SUBCUTANEOUS
  Administered 2023-07-15: 2 [IU] via SUBCUTANEOUS
  Administered 2023-07-15 – 2023-07-16 (×7): 3 [IU] via SUBCUTANEOUS
  Administered 2023-07-16: 5 [IU] via SUBCUTANEOUS
  Administered 2023-07-17: 3 [IU] via SUBCUTANEOUS
  Administered 2023-07-17: 5 [IU] via SUBCUTANEOUS
  Administered 2023-07-17 (×2): 3 [IU] via SUBCUTANEOUS
  Administered 2023-07-17: 5 [IU] via SUBCUTANEOUS
  Administered 2023-07-18: 11 [IU] via SUBCUTANEOUS
  Administered 2023-07-18: 5 [IU] via SUBCUTANEOUS
  Administered 2023-07-18: 8 [IU] via SUBCUTANEOUS
  Administered 2023-07-18: 5 [IU] via SUBCUTANEOUS
  Administered 2023-07-18: 8 [IU] via SUBCUTANEOUS
  Administered 2023-07-19: 5 [IU] via SUBCUTANEOUS
  Administered 2023-07-19: 8 [IU] via SUBCUTANEOUS
  Administered 2023-07-19 (×2): 5 [IU] via SUBCUTANEOUS
  Administered 2023-07-19: 3 [IU] via SUBCUTANEOUS
  Administered 2023-07-19: 5 [IU] via SUBCUTANEOUS
  Administered 2023-07-19: 3 [IU] via SUBCUTANEOUS
  Administered 2023-07-20: 5 [IU] via SUBCUTANEOUS
  Administered 2023-07-20 (×2): 8 [IU] via SUBCUTANEOUS
  Administered 2023-07-20 (×3): 5 [IU] via SUBCUTANEOUS
  Administered 2023-07-21: 3 [IU] via SUBCUTANEOUS
  Administered 2023-07-22 (×2): 2 [IU] via SUBCUTANEOUS
  Administered 2023-07-22: 3 [IU] via SUBCUTANEOUS
  Administered 2023-07-22: 2 [IU] via SUBCUTANEOUS

## 2023-07-15 MED ORDER — OXYCODONE HCL 5 MG PO TABS
5.0000 mg | ORAL_TABLET | Freq: Four times a day (QID) | ORAL | Status: DC | PRN
  Administered 2023-07-18 – 2023-07-22 (×3): 5 mg
  Filled 2023-07-15 (×4): qty 1

## 2023-07-15 MED ORDER — HEPARIN SODIUM (PORCINE) 1000 UNIT/ML DIALYSIS: 1000.0000 [IU] | INTRAMUSCULAR | Status: DC | PRN

## 2023-07-15 MED ORDER — PIPERACILLIN-TAZOBACTAM 3.375 G IVPB 30 MIN
3.3750 g | Freq: Four times a day (QID) | INTRAVENOUS | Status: DC
  Administered 2023-07-15 – 2023-07-16 (×4): 3.375 g via INTRAVENOUS
  Filled 2023-07-15 (×4): qty 50

## 2023-07-15 NOTE — Consult Note (Signed)
 Consultation  Referring Provider: Clover Mealy Drucie Ip Primary Care Physician:  Benetta Spar, MD Primary Gastroenterologist:  Dr. Neoma Laming GI  Reason for  re-Consultation: Decompensated alcohol induced cirrhosis, acute severe pancreatitis, hypoxic respiratory failure,sepsis   HPI: [name] [name] is a 58 y.o. male who was initially seen by the GI service on 07/07/2023 after he presented to [facility] on 07/05/2023 with nausea vomiting and diarrhea as well as abdominal pain.  Patient found to have acute pancreatitis, and evidence for potential shock liver and was transferred to [facility]. Parameters here consistent with acute on chronic liver failure but INR was up to 9.9 on 07/07/2023. He also developed acute kidney injury/failure and has required CRRT. Initially suspected he may have gallstone pancreatitis. MRI/MRCP was ordered but apparently that was not done because he was not stable enough.  Bedside ultrasound was also considered. CT without contrast on 07/07/2023 showed new extensive upper abdominal inflammatory stranding emanating from the lesser sac and root of the small bowel mesentery noncontrast pancreas remained indistinct no organized fluid identified.  No free fluid seen.  Simple, there is adjacent inflammatory stranding around the spleen Also noted a partially visible posterior mediastinal mass approximately 6 cm and CT of the chest with IV contrast was recommended  Because of the acute on chronic hepatic failure he did complete a course of N-acetylcysteine, was treated with vitamin K with improvement in INR, and has been on lactulose. 07/08/2023 intubated, requiring 3 pressors, remains on CRRT, broad-spectrum antibiotics 07/09/2023-had been on continuous D10 for hypoglycemia and stress steroids were started 3/11 respiratory culture growing gram-negative rods Head CT-stable noncontrast appearance no acute abnormality Attempted to self extubate on 311  agitated 3/12-extubated-on 2 L, still on Levophed 3/13-off pressors, awake but not following commands Palliative care consultations and goals of care conversations, family decided to not resuscitate but continue all aggressive measures  3/15 concern for retroperitoneal bleeding some right lower quadrant bruising Also required reinstitution of pressors possibly secondary to volume depletion improved with albumin, reinstitution of antibiotics and steroids Noncontrasted CT abdomen pelvis- Mediastinal adenopathy, right paratracheal lymph node, subcarinal adenopathy, small bilateral pleural effusions Low-density material in the gallbladder could reflect stones or sludge no focal hepatic abnormality, pancreas enlarged with surrounding edema compatible with acute pancreatitis, small bilateral pleural effusions atelectasis lower lobes patchy groundglass opacities upper lobes.  Left pneumonia, moderate ascites  Today-nephrology reconsulted, started back on CRRT Patient awakens to exam but not responsive  Labs today-WBC 25.7/hemoglobin 8.9/hematocrit 25.1/platelets 74 Sodium 134/potassium 3.6 BUN 87/creatinine 5.16 T. bili 17.2/alk phos 153/AST 91/ALT 109 INR 2.1    Past Medical History:  Diagnosis Date   Asthma    B12 deficiency 03/15/2022   Cirrhosis (HCC)    Dyspnea    High cholesterol    History of kidney stones    Hypertension    Kidney stones     Past Surgical History:  Procedure Laterality Date   APPENDECTOMY     BIOPSY  07/22/2020   Procedure: BIOPSY;  Surgeon: Corbin Ade, MD;  [facility];  Service: Endoscopy;;   COLONOSCOPY WITH PROPOFOL N/A 04/18/2017   non-bleeding internal hemorrhoids, two 4-6 mm polyps in descending colon and cecum, pancolonic diverticulosis, single cecal AVM. Tubular adenomas, surveillance in 2023.    COLONOSCOPY WITH PROPOFOL N/A 04/13/2022   pancolonic diverticulosis, multiple polyps, one which was 11 mm (tubular adenomas).  Surveillance in 3 years.   ESOPHAGOGASTRODUODENOSCOPY (EGD) WITH PROPOFOL N/A 07/22/2020   normal esophagus, small hiatal  hernia, abnormal gastric mucosa, abnormal appearing ampula and periampullary mucosa. Mild chronic gastritis.   ESOPHAGOGASTRODUODENOSCOPY (EGD) WITH PROPOFOL N/A 09/08/2021   normal esophagus, retained food in stomach and duodenum precluded complete examination.   FRACTURE SURGERY     left arm   IR PARACENTESIS  05/31/2021   LOWER EXTREMITY VENOGRAPHY N/A 08/10/2020   Procedure: LOWER EXTREMITY VENOGRAPHY;  Surgeon: Maeola Harman, MD;  [facility] INVASIVE CV LAB;  Service: Cardiovascular;  Laterality: N/A;   PERIPHERAL VASCULAR BALLOON ANGIOPLASTY Left 08/10/2020   Procedure: PERIPHERAL VASCULAR BALLOON ANGIOPLASTY;  Surgeon: Maeola Harman, MD;  Location: [facility] [place] INVASIVE CV LAB;  Service: Cardiovascular;  Laterality: Left;  lower extremity venous   PERIPHERAL VASCULAR THROMBECTOMY N/A 08/10/2020   Procedure: PERIPHERAL VASCULAR THROMBECTOMY;  Surgeon: Maeola Harman, MD;  Location: [facility] INVASIVE CV LAB;  Service: Cardiovascular;  Laterality: N/A;   POLYPECTOMY  04/18/2017   Procedure: POLYPECTOMY;  Surgeon: Corbin Ade, MD;  [facility];  Service: Endoscopy;;   POLYPECTOMY  04/13/2022   Procedure: POLYPECTOMY;  Surgeon: Corbin Ade, MD;  [facility];  Service: Endoscopy;;    Prior to Admission medications   Medication Sig Start Date End Date Taking? Authorizing Provider  albuterol (PROVENTIL HFA) 108 (90 Base) MCG/ACT inhaler INHALE 2 PUFFS BY MOUTH EVERY 6 HOURS AS NEEDED FOR COUGHING, WHEEZING, OR SHORTNESS OF BREATH Patient taking differently: Inhale 2 puffs into the lungs every 6 (six) hours as needed for shortness of breath or wheezing. 02/10/21  Yes Shon Hale, MD  aspirin EC 81 MG tablet Take 81 mg by mouth daily.   Yes [provider]  atorvastatin (LIPITOR) 20 MG tablet Take 20 mg by  mouth daily. 05/23/23  Yes [provider]  cyanocobalamin (VITAMIN B12) 1000 MCG/ML injection Inject 1,000 mcg into the muscle every 30 (thirty) days.   Yes [provider]  feeding supplement (ENSURE ENLIVE / ENSURE PLUS) LIQD Take 237 mLs by mouth 3 (three) times daily between meals. 03/14/23  Yes Shah, Pratik D, DO  furosemide (LASIX) 20 MG tablet TAKE 2 TABLETS BY MOUTH EVERY MORNING AND 1 TABLET IN THE AFTERNOON Patient taking differently: Take 20 mg by mouth See admin instructions. Take 40 mg in the morning and 20 mg every afternoon 01/12/23  Yes Gelene Mink, NP  metoprolol tartrate (LOPRESSOR) 25 MG tablet Take 25 mg by mouth 2 (two) times daily. 05/24/21  Yes [provider]  mometasone-formoterol (DULERA) 200-5 MCG/ACT AERO Inhale 2 puffs into the lungs 2 (two) times daily. 02/10/21  Yes Emokpae, Courage, MD  ondansetron (ZOFRAN) 4 MG tablet Take 1 tablet (4 mg total) by mouth daily as needed for nausea or vomiting. 03/14/23 03/13/24 Yes Shah, Pratik D, DO  pantoprazole (PROTONIX) 40 MG tablet TAKE 1 TABLET(40 MG) BY MOUTH DAILY Patient taking differently: Take 40 mg by mouth daily. 09/18/22  Yes Gelene Mink, NP  sodium bicarbonate 650 MG tablet Take 1 tablet (650 mg total) by mouth 3 (three) times daily. 09/25/22  Yes Vassie Loll, MD  spironolactone (ALDACTONE) 50 MG tablet TAKE 2 TABLETS BY MOUTH EVERY MORNING AND 1 TABLET DAILY IN THE AFTERNOON Patient taking differently: Take 50 mg by mouth See admin instructions. Take 100 mg in the morning and 50 mg in the afternoon 12/28/22  Yes Gelene Mink, NP  tamsulosin (FLOMAX) 0.4 MG CAPS capsule Take 1 capsule (0.4 mg total) by mouth daily after supper. 05/10/23  Yes Gelene Mink,  NP    Current Facility-Administered Medications  Medication Dose Route Frequency Provider Last Rate Last Admin   alteplase (CATHFLO ACTIVASE) injection 2 mg  2 mg Intracatheter Once PRN Tyler Pita, MD       arformoterol [facility])  nebulizer solution 15 mcg  15 mcg Nebulization BID Selmer Dominion B, NP   15 mcg at 07/15/23 0845   budesonide (PULMICORT) nebulizer solution 0.5 mg  0.5 mg Nebulization BID Selmer Dominion B, NP   0.5 mg at 07/15/23 0845   Chlorhexidine Gluconate Cloth 2 % PADS 6 each  6 each Topical Q0600 Annie Sable, MD   6 each at 07/15/23 0510   feeding supplement (PROSource TF20) liquid 60 mL  60 mL Per Tube Daily Cheri Fowler, MD   60 mL at 07/15/23 1006   feeding supplement (VITAL 1.5 CAL) liquid 1,000 mL  1,000 mL Per Tube Continuous Cheri Fowler, MD 40 mL/hr at 07/15/23 1500 Infusion Verify at 07/15/23 1500   folic acid (FOLVITE) tablet 1 mg  1 mg Per Tube Daily Roslyn Smiling, RPH   1 mg at 07/15/23 1005   haloperidol lactate (HALDOL) injection 5 mg  5 mg Intravenous Q6H PRN Paliwal, Aditya, MD       heparin injection 1,000-6,000 Units  1,000-6,000 Units CRRT PRN Annie Sable, MD       insulin aspart (novoLOG) injection 0-15 Units  0-15 Units Subcutaneous Q4H Janeann Forehand D, NP   5 Units at 07/15/23 1225   ipratropium-albuterol (DUONEB) 0.5-2.5 (3) MG/3ML nebulizer solution 3 mL  3 mL Nebulization Q6H PRN Lynnell Catalan, MD       lactulose (CHRONULAC) 10 GM/15ML solution 10 g  10 g Per Tube BID Cheri Fowler, MD   10 g at 07/15/23 1006   midodrine (PROAMATINE) tablet 10 mg  10 mg Per Tube Q8H Simonne Martinet, NP   10 mg at 07/15/23 1415   nicotine (NICODERM CQ - dosed in mg/24 hours) patch 14 mg  14 mg Transdermal Daily Paliwal, Aditya, MD   14 mg at 07/15/23 1007   ondansetron (ZOFRAN) injection 4 mg  4 mg Intravenous Q6H PRN Sherryll Burger, Pratik D, DO   4 mg at 07/06/23 1302   Oral care mouth rinse  15 mL Mouth Rinse PRN Cheri Fowler, MD       Oral care mouth rinse  15 mL Mouth Rinse 4 times per day Cheri Fowler, MD   15 mL at 07/15/23 1219   Oral care mouth rinse  15 mL Mouth Rinse PRN Cheri Fowler, MD       oxyCODONE (Oxy IR/ROXICODONE) immediate release tablet 5 mg  5 mg Per Tube Q6H  PRN Janeann Forehand D, NP       pantoprazole (PROTONIX) injection 40 mg  40 mg Intravenous Q24H Selmer Dominion B, NP   40 mg at 07/15/23 1007   piperacillin-tazobactam (ZOSYN) IVPB 3.375 g  3.375 g Intravenous Q6H Calton Dach I, [facility]   Stopped at 07/15/23 1251   prismasol BGK 4/2.5 infusion   CRRT Continuous Annie Sable, MD 400 mL/hr at 07/15/23 1325 New Bag at 07/15/23 1325   prismasol BGK 4/2.5 infusion   CRRT Continuous Annie Sable, MD 400 mL/hr at 07/15/23 1005 New Bag at 07/15/23 1005   prismasol BGK 4/2.5 infusion   CRRT Continuous Annie Sable, MD 1,500 mL/hr at 07/15/23 1004 New Bag at 07/15/23 1004   QUEtiapine (SEROQUEL) tablet 50 mg  50 mg Per Tube BID Cheri Fowler, MD  50 mg at 07/15/23 1005   sodium chloride 0.9 % primer fluid for CRRT   CRRT PRN Arita Miss, MD   Given at 07/09/23 2100   thiamine (VITAMIN B1) tablet 100 mg  100 mg Per Tube Daily Roslyn Smiling, RPH   100 mg at 07/15/23 1005    Allergies as of 07/05/2023   (No Known Allergies)    Family History  Problem Relation Age of Onset   Cancer Father        throat   Diabetes Sister    Colon cancer Neg Hx     Social History   Socioeconomic History   Marital status: Single    Spouse name: Not on file   Number of children: Not on file   Years of education: Not on file   Highest education level: Not on file  Occupational History   Not on file  Tobacco Use   Smoking status: Every Day    Current packs/day: 0.50    Average packs/day: 0.5 packs/day for 33.0 years (16.5 ttl pk-yrs)    Types: Cigarettes    Passive exposure: Current   Smokeless tobacco: Never  Vaping Use   Vaping status: Never Used  Substance and Sexual Activity   Alcohol use: Not Currently    Comment: last deink 1 week ago   Drug use: No   Sexual activity: Yes    Birth control/protection: None  Other Topics Concern   Not on file  Social History Narrative   Not on file   Social Drivers of Health    Financial Resource Strain: High Risk (11/28/2021)   [facility]   Overall Financial Resource Strain (CARDIA)    Difficulty of Paying Living Expenses: Very hard  Food Insecurity: No Food Insecurity (07/06/2023)   Hunger Vital Sign    Worried About Running Out of Food in the Last Year: Never true    Ran Out of Food in the Last Year: Never true  Transportation Needs: No Transportation Needs (07/06/2023)   PRAPARE - Administrator, Civil Service (Medical): No    Lack of Transportation (Non-Medical): No  Physical Activity: Sufficiently Active (09/13/2020)   Exercise Vital Sign    Days of Exercise per Week: 7 days    Minutes of Exercise per Session: 30 min  Stress: No Stress Concern Present (09/13/2020)   [employer] of Occupational Health - Occupational Stress Questionnaire    Feeling of Stress : Only a little  Social Connections: Moderately Isolated (09/13/2020)   Social Connection and Isolation Panel [NHANES]    Frequency of Communication with Friends and Family: More than three times a week    Frequency of Social Gatherings with Friends and Family: Once a week    Attends Religious Services: More than 4 times per year    Active Member of [employer] or Organizations: No    Attends Banker Meetings: Never    Marital Status: Never married  Intimate Partner Violence: Not At Risk (07/06/2023)   Humiliation, Afraid, Rape, and Kick questionnaire    Fear of Current or Ex-Partner: No    Emotionally Abused: No    Physically Abused: No    Sexually Abused: No    Review of Systems: Patient unable to offer  Physical Exam: Vital signs in last 24 hours: Temp:  [98 F (36.7 C)-99.2 F (37.3 C)] 98.4 F (36.9 C) (03/16 1521) Pulse Rate:  [102-124] 115 (03/16 1500) Resp:  [13-26] 13 (03/16 1500)  BP: (97-175)/(50-92) 101/54 (03/16 1500) SpO2:  [92 %-100 %] 96 % (03/16 1500) Weight:  [84 kg] 84 kg (03/16 0308) Last BM Date :  07/15/23 General:   Somnolent well-developed, acute and chronically ill-appearing older African-American male , unresponsive to voice, opens eyes to exam, undergoing t Head:  Normocephalic and atraumatic. Eyes:  Sclera clear, no icterus.   Conjunctiva pink. Ears:  Normal auditory acuity. Nose:  No deformity, discharge,  or lesions. Mouth:  No deformity or lesions.   Neck:  Supple; no masses or thyromegaly. Lungs: Decreased breath sounds bilaterally   no wheezes, crackles, or rhonchi.  Heart: tachy  Regular rate and rhythm; no murmurs, clicks, rubs,  or gallops. Abdomen:  Soft, full feeling of colonoscopy Upper abdomen, no palpable mass or hepatosplenomegaly, opens eyes to exam, may be due to tenderness.  Bowel sounds are present Rectal: Rectal pouch in place, short course appearing stool liquid Msk:  Symmetrical without gross deformities. . Pulses:  Normal pulses noted. Extremities:  Without clubbing or edema. Neurologic: Opens eyes to exam, no attempt to speak Skin:  Intact without significant lesions or rashes..   Intake/Output from previous day: 03/15 0701 - 03/16 0700 In: 2937.3 [I.V.:247; WU/JW:1191.4; IV Piggyback:1463.9] Out: 905 [Urine:455; Stool:450] Intake/Output this shift: Total I/O In: 666.6 [I.V.:17.5; NG/GT:530; IV Piggyback:119.1] Out: 705 [Urine:195; Stool:250]  Lab Results: Recent Labs    07/13/23 0547 07/15/23 0500  WBC 21.6* 25.7*  HGB 10.5* 8.9*  HCT 29.9* 25.1*  PLT 48* 74*   BMET Recent Labs    07/13/23 0547 07/14/23 0230 07/15/23 0500  NA 132* 134* 134*  134*  K 3.4* 3.7 3.6  3.6  CL 102 103 101  100  CO2 20* 17* 18*  18*  GLUCOSE 133* 190* 237*  235*  BUN 52* 69* 87*  86*  CREATININE 3.35* 4.53* 5.16*  5.13*  CALCIUM 7.3* 7.3* 7.5*  7.6*   LFT Recent Labs    07/14/23 1509 07/15/23 0500  PROT 7.0 6.9  ALBUMIN 2.1* 2.5*  2.5*  AST 120* 91*  ALT 139* 109*  ALKPHOS 185* 153*  BILITOT 14.8* 17.2*  BILIDIR 9.4*  --   IBILI  5.4*  --    PT/INR Recent Labs    07/14/23 0230 07/15/23 0500  LABPROT 20.2* 23.5*  INR 1.7* 2.1*   Hepatitis Panel No results for input(s): "HEPBSAG", "HCVAB", "HEPAIGM", "HEPBIGM" in the last 72 hours.     IMPRESSION:  #27 58 year old African-American male with history of decompensated alcohol-related cirrhosis, hemosiderosis, chronic kidney disease stage IIIb, COPD-admitted on 07/05/2023 with complaints of abdominal pain x 3 to 4 days Diagnosed with acute pancreatitis, septic shock, and acute renal failure and transferred to [facility]. Initially intubated, has been extubated over the past 3 to 4 days  Unfortunately has had persistent evidence of renal failure and is again requiring CRRT  Acute shock liver on presentation-LFTs much improved and INR has improved significantly though still 2.1 today  CT of the abdomen pelvis noncontrasted yesterday still showed evidence of acute pancreatitis, difficult to assess for any evidence of necrosis with lack of IV contrast Patient does have gallstones but is not clear whether this was a gallstone induced pancreatitis or EtOH induced pancreatitis  Persistent hypoglycemia had precipitated starting steroids, not EtOH induced hepatitis.  Abnormal chest imaging-mediastinal lymphadenopathy and posterior mediastinal mass  Patient remains critically ill  PLAN: Will need to discuss with nephrology but contrasted CT abdomen and pelvis and chest are indicated due to concerns for  mediastinal lymphadenopathy and possible mediastinal mass/lymphoma.  At present do not have a definite indication for  antibiotics from a GI perspective  Continue stress dose steroids Replace core track, restart tube feedings  Continue goals of care discussions with family GI will follow with you      Chereese Cilento EsterwoodPA-C  07/15/2023, 3:22 PM

## 2023-07-15 NOTE — Plan of Care (Signed)
   Problem: Nutrition: Goal: Adequate nutrition will be maintained Outcome: Progressing   Problem: Coping: Goal: Level of anxiety will decrease Outcome: Progressing   Problem: Pain Managment: Goal: General experience of comfort will improve and/or be controlled Outcome: Progressing

## 2023-07-15 NOTE — Progress Notes (Signed)
 [facility] NEPHROLOGY PROGRESS NOTE  Assessment/ Plan: Pt is a 58 y.o. 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   # Dialysis dependent anuric AKI on CKD3b likely ischemic ATN, started HD 07/06/2023 using femoral temp HD catheter placed by surgery at [facility]. - CRRT 3/9-3/12.  Remains extubated, more alert but confused.  Not much urine output.  BUN and crt rising daily  off of CRRT .   Plan was for HD on 3/15 but then pressors  resumed so we put on hold.  I started some lasix to see if can induce some UOP- not much impact --stopped.  Noted palliative care discussion about goals of care-  no decisions made as of yet re dialysis .  was more hypotensive yesterday  but also with sinus tach-  has some edema but likely  third spacing-  I gave fluid bolus to see if his hemodynamics improve, they did not improve enough- will need to resume CRRT- at least temporarily-  no volume removal   I agree that he is likely a poor candidate for long term HD-  MELD gives a high level of mortality and with his confusion is not ideal-  he is looking at SNF and dialysis and a poor quality of life    # Septic shock: Off of pressors but had to be resumed.    #Severe gallstone pancreatitis with transaminitis: Per critical care.  #Anion gap metabolic acidosis/lactic acidosis: Improving.  # Hyperkalemia resolved  Anemia-  had not been a major issue -  but now dropped 2 grams-  concern for [facility] but did not have on imaging-  supportive care    Palliative has engaged with family -  is DNR but no other limits of care have been placed  Subjective:  Urine output 455 cc which is more.    BUN and crt climbing.  Is alert but has been agitated-  given haldol.  Resting this AM.  Pressors had to be resumed yesterday-  titrating up quickly -  thought it could be a  volume issue-  given volume  - up 2 liters but still on pressors  Objective Vital signs in last 24 hours: Vitals:   07/15/23 0645 07/15/23 0700 07/15/23 0715 07/15/23 0738  BP: 122/70 (!) 121/57 113/63   Pulse: (!) 115 (!) 114 (!) 110   Resp: 16 16 16    Temp:    99.2 F (37.3 C)  TempSrc:    Axillary  SpO2: 95% 95% 95%   Weight:      Height:       Weight change: 2.9 kg  Intake/Output Summary (Last 24 hours) at 07/15/2023 0815 Last data filed at 07/15/2023 0700 Gross per 24 hour  Intake 2882.24 ml  Output 905 ml  Net 1977.24 ml       Labs: RENAL PANEL Recent Labs  Lab 07/11/23 0350 07/11/23 0744 07/12/23 0325 07/12/23 0800 07/12/23 1800 07/13/23 0547 07/13/23 0943 07/13/23 1800 07/14/23 0230 07/14/23 1013 07/14/23 1509 07/14/23 1809 07/15/23 0500  NA 133*  --  135  --   --  132*  --   --  134*  --   --   --  134*  134*  K 3.9  --  3.7  --   --  3.4*  --   --  3.7  --   --   --  3.6  3.6  CL 100  --  103  --   --  102  --   --  103  --   --   --  101  100  CO2 21*  --  21*  --   --  20*  --   --  17*  --   --   --  18*  18*  GLUCOSE 121*  --  134*  --   --  133*  --   --  190*  --   --   --  237*  235*  BUN 11  --  29*  --   --  52*  --   --  69*  --   --   --  87*  86*  CREATININE 1.38*  --  2.30*  --   --  3.35*  --   --  4.53*  --   --   --  5.16*  5.13*  CALCIUM 7.3*  --  7.2*  --   --  7.3*  --   --  7.3*  --   --   --  7.5*  7.6*  MG  --    < > 2.4 2.5*   < > 2.6*   < > 2.5* 2.6* 2.5*  --  2.6* 2.6*  PHOS 2.6   < > 2.8  --    < > 3.4  3.5  --  4.2 4.0  3.8  --  3.6  --  4.4  4.4  ALBUMIN 2.1*  --  1.8* 1.8*  --  1.8*  1.8*  --   --  1.7*  --  2.1*  --  2.5*  2.5*   < > = values in this interval not displayed.    Liver Function Tests: Recent Labs  Lab 07/13/23 0547 07/14/23 0230 07/14/23 1509 07/15/23 0500  AST 161*  --  120* 91*  ALT 224*  --  139* 109*  ALKPHOS 167*  --  185* 153*  BILITOT 13.3*  --  14.8* 17.2*  PROT 6.8  --  7.0 6.9  ALBUMIN 1.8*  1.8*  1.7* 2.1* 2.5*  2.5*   Recent Labs  Lab 07/12/23 0800 07/13/23 0547 07/14/23 1509  LIPASE 101* 150* 302*   No results for input(s): "AMMONIA" in the last 168 hours.  CBC: Recent Labs    09/22/22 0926 09/22/22 0927 09/22/22 1003 09/29/22 1052 12/02/22 2225 03/19/23 1359 03/19/23 1400 03/26/23 0954 06/25/23 1008 07/05/23 0615 07/10/23 0346 07/11/23 0346 07/12/23 0800 07/13/23 0547 07/15/23 0500  HGB 12.9*  --    < >  --    < > 10.1*  --    < >  --    < > 9.6* 9.3* 9.6* 10.5* 8.9*  MCV 90.7  --    < >  --    < > 92.2  --    < >  --    < > 86.7 86.9 86.5 84.7 85.4  VITAMINB12  --  687  --   --   --   --  1,053*  --   --   --   --   --   --   --   --   FOLATE 13.2  --   --   --   --  6.3  --   --   --   --   --   --   --   --   --  FERRITIN  --  217  --  210  --   --  561*  --  218  --   --   --   --   --   --   TIBC  --  273  --  256  --   --  175*  --  224*  --   --   --   --   --   --   IRON  --  41*  --  61  --   --  27*  --  50  --   --   --   --   --   --    < > = values in this interval not displayed.    Cardiac Enzymes: No results for input(s): "CKTOTAL", "CKMB", "CKMBINDEX", "TROPONINI" in the last 168 hours. CBG: Recent Labs  Lab 07/14/23 1552 07/14/23 2129 07/15/23 0021 07/15/23 0504 07/15/23 0736  GLUCAP 121* 202* 196* 222* 193*    Iron Studies: No results for input(s): "IRON", "TIBC", "TRANSFERRIN", "FERRITIN" in the last 72 hours. Studies/Results: CT CHEST ABDOMEN PELVIS WO CONTRAST Result Date: 07/14/2023 CLINICAL DATA:  Sepsis EXAM: CT CHEST, ABDOMEN AND PELVIS WITHOUT CONTRAST TECHNIQUE: Multidetector CT imaging of the chest, abdomen and pelvis was performed following the standard protocol without IV contrast. RADIATION DOSE REDUCTION: This exam was performed according to the departmental dose-optimization program which includes automated exposure control, adjustment of the mA and/or kV according to patient size and/or use of iterative  reconstruction technique. COMPARISON:  07/07/2023 FINDINGS: CT CHEST FINDINGS Cardiovascular: Heart is normal size. Aorta is normal caliber. Moderate coronary artery and aortic atherosclerosis. Mediastinum/Nodes: Mediastinal adenopathy. Right paratracheal lymph node has a short axis diameter of 3 cm. Subcarinal adenopathy has a short axis diameter of 3.2 cm. Concern for right hilar adenopathy although difficult to evaluate without intravenous contrast. No axillary adenopathy. Trachea and esophagus are unremarkable. Thyroid unremarkable. Lungs/Pleura: Small bilateral pleural effusions. Dependent atelectasis in the lower lobes. Mild vascular congestion. Patchy ground-glass opacities anteriorly in the right upper lobe and lingula could reflect early pneumonia. Musculoskeletal: Bilateral gynecomastia.  No acute bony abnormality. CT ABDOMEN PELVIS FINDINGS Hepatobiliary: High-density material noted within the gallbladder could reflect small stones or sludge. No focal hepatic abnormality. Pancreas: Pancreas appears enlarged. Surrounding edema compatible with acute pancreatitis. Spleen: No focal abnormality.  Normal size. Adrenals/Urinary Tract: Adrenal glands normal. Atrophic left kidney. No stones or hydronephrosis bilaterally. Urinary bladder decompressed with Foley catheter in place. Stomach/Bowel: Scattered colonic diverticulosis. No active diverticulitis. NG tube is in place with the tip in the duodenal bulb. Small bowel decompressed. Vascular/Lymphatic: Aortic atherosclerosis. No evidence of aneurysm or adenopathy. Reproductive: No visible focal abnormality. Other: Moderate ascites in the abdomen and pelvis. No free air. No retroperitoneal hemorrhage. Musculoskeletal: No acute bony abnormality. Anasarca throughout the subcutaneous soft tissues. IMPRESSION: Mediastinal adenopathy. This is concerning for possible metastasis or lymphoma. Coronary artery disease, aortic atherosclerosis. Small bilateral pleural effusions  with dependent atelectasis in the lower lobes. Patchy ground-glass opacities in the upper lobes could reflect pneumonia. Enlarged edematous pancreas with surrounding inflammation compatible with acute pancreatitis. Moderate ascites. Electronically Signed   By: Charlett Nose M.D.   On: 07/14/2023 21:33   DG Abd Portable 1V Result Date: 07/13/2023 CLINICAL DATA:  Feeding tube placement EXAM: PORTABLE ABDOMEN - 1 VIEW COMPARISON:  07/11/2023 FINDINGS: Soft feeding tube enters the stomach, passes to the fundus and has its tip in the mid body. Gas present throughout much of the  small bowel which could be due to ileus or partial small bowel obstruction. IMPRESSION: Soft feeding tube tip in the mid body of the stomach. Increase in small bowel air which could be due to ileus or partial small bowel obstruction. Electronically Signed   By: Paulina Fusi M.D.   On: 07/13/2023 16:38    Medications: Infusions:  feeding supplement (VITAL 1.5 CAL) 40 mL/hr at 07/15/23 0700   norepinephrine (LEVOPHED) Adult infusion Stopped (07/15/23 0149)   piperacillin-tazobactam (ZOSYN)  IV Stopped (07/15/23 0542)   vasopressin 0.02 Units/min (07/15/23 0700)    Scheduled Medications:  arformoterol  15 mcg Nebulization BID   budesonide (PULMICORT) nebulizer solution  0.5 mg Nebulization BID   Chlorhexidine Gluconate Cloth  6 each Topical Q0600   Chlorhexidine Gluconate Cloth  6 each Topical Q0600   feeding supplement (PROSource TF20)  60 mL Per Tube Daily   folic acid  1 mg Per Tube Daily   insulin aspart  0-6 Units Subcutaneous TID WC   lactulose  10 g Per Tube BID   midodrine  10 mg Per Tube Q8H   nicotine  14 mg Transdermal Daily   mouth rinse  15 mL Mouth Rinse 4 times per day   oxyCODONE  5 mg Per Tube Q6H   pantoprazole (PROTONIX) IV  40 mg Intravenous Q24H   QUEtiapine  50 mg Per Tube BID   thiamine  100 mg Per Tube Daily    have reviewed scheduled and prn medications.  Physical Exam: General: resting-   because had been agitated did not wake  Heart:RRR, s1s2 nl Lungs: Clear bilateral. Abdomen:soft, non-distended Extremities + leg edema  Dialysis Access: temp HD line- .placed on 3/7  Calandra Madura A Greysen Swanton 07/15/2023,8:15 AM  LOS: 10 days

## 2023-07-15 NOTE — Progress Notes (Addendum)
 eLink Physician-Brief Progress Note Patient Name: [name] [name] DOB: [birth year] MRN: [id number]   Date of Service  07/15/2023  HPI/Events of Note  Received request for renewal of restraints Patient seen on camera and a risk for self harm by pulling lines and tubes  eICU Interventions  Bilateral soft wrist restraints renewed Bedside team to assess in am if restraints to be continued     Intervention Category Minor Interventions: Agitation / anxiety - evaluation and management  Rosalie Gums Kyjuan Gause 07/15/2023, 1:28 AM

## 2023-07-15 NOTE — Progress Notes (Signed)
 NAME:  [name] [name], MRN:  [id number], DOB:  [birth year], LOS: 10 ADMISSION DATE:  07/05/2023, CONSULTATION DATE:  07/06/2023 REFERRING MD:  Randye Lobo, CHIEF COMPLAINT:  Abdominal Pain   History of Present Illness:  [name] is a 58 year old gentleman with a known medical history significant for hypertension, COPD, alcoholic liver cirrhosis, hemosiderosis, CKD 3B, dyslipidemia, prior left lower extremity DVT presenting to [facility] health ED secondary to 3 to 4-day complaint of abdominal pain.  Patient describes bilateral flank type pain increasing in intensity without radiation to groin chest or back.  He states this is similar pain to his previous admission for pancreatitis.  Patient does elicit nausea and vomiting and diarrhea.   It is noted that the patient does have history of cholelithiasis and decompensated cirrhosis.  Patient reports he has not had any alcohol since his birthday on [birth year], however according to GI note he made comments that his last drink was on this past Sunday.  CT of the abdomen pelvis without contrast performed on July 05, 2023 reveals: IMPRESSION: 1. Pancreas is diffusely ill-defined through the tail region with subtle peripancreatic edema/inflammation. Imaging features compatible with acute pancreatitis. Pancreatic protocol CT recommended if renal function permits. If not, MRI of the abdomen with and without contrast recommended as pancreatic mass lesion cannot be excluded. 2. Hepatic cirrhosis with hepatic steatosis. 3. Cholelithiasis. 4. Left colonic diverticulosis without diverticulitis. 5. Atrophic left kidney.  Abdomen Limited ultrasound performed today 07/06/2023 reveals no significant abdominal ascites.  Laboratory indices: He does have elevation in coagulation studies with a PT of 26 on yesterday that elevated to 50.4 today 3 10/31/2023 and INR at admission yesterday 2.4 elevated to 5.5 today.  CBC: WBC 15.3, hemoglobin 11, hematocrit 36.7, platelets 164.   BMP sodium 132, potassium 5.8, chloride 99, CO2 14 BUN 40, creatinine 4.81 and glucose 214.  LFTs: Lipase 2929, AST 5021, ALT 05/08/2007.  Secondary to patient's known CKD presenting now with AKI and presence of oliguria a temporary HD line was placed by surgery team today for hemodialysis.  Patient underwent 2-hour HD in the ED prior to transfer.  Patient did receive IV fluids for pancreatitis management, as he is a poor candidate for liver transplant due to ongoing alcohol use per GI note.  Consults: Patient has been followed by nephrology and gastrointestinal services Blood cultures sent 07/06/2023 secondary to leukocytosis.  As such patient was transferred to [facility] for ICU monitoring and management and admitted with acute on chronic liver failure/pancreatitis.  Pertinent  Medical History  Asthma, B12 deficiency, cirrhosis, dyspnea, hyperlipidemia, history of kidney stones, hypertension, kidney stones, COPD, prior lower left extremity DVT, chronic anemia.  Significant Hospital Events: Including procedures, antibiotic start and stop dates in addition to other pertinent events   3/7 Hemodialysis  (temporary HD line placed) s/p short HD, tx to [facility] 3/8 worsening shock, pressors, CTH/ CT a/p 3/9 intubated. On 3 pressors. Renal fxn and lactic acidosis worse. Started CRRT 3/10 LFTs a little better. Still on 3 pressors. Comfortable efforts on PSV but mental status not amendable to extubation. ECHO EF 55% no WMA, grade I diastolic dysfxn, started stress dose steroids. Right internal jugular CVL placed. The epi was weaned off over course of am after stress dose steroids started, NE weaning stopped fent gtt changed to PRN. D10 rate reduced to 10cc/hr. Acetylcysteine completed  3/11 vasopressin discontinued on very low-dose norepinephrine starting to wean sedation further to assess for readiness to wean ventilation but not responding and  appeared to have left gaze pref after sedation off for prolonged  time. CT head obtained no acute findings. Abx switched to zosyn 3/12 woke up during evening. Agitated at times. Attempting to get OOB. Still seeming to have left sided weakness but exam much closer to that of the 10th. Norepi off.  Extubated.  Taken off CRRT, encephalopathic but tolerated extubation 3/13 agitated during the evening hours, hypertensive and tachycardic moaning.  Started on Precedex.  This was discontinued on a.m. rounds.  Remains off pressors remains off dialysis minimal urine output.  Had a goals of care discussion with palliative and critical care.  Critical care recommending DO NOT RESUSCITATE, also shared with family patient's appropriateness for hospice going forward.  Family taking CODE STATUS into advisement with plan to follow-up 3/14 Patient was agitated overnight, pulled out his core track. Off vasopressors 3/15 Back on pressors including levo and vaso, likely secondary to volume depletion given improvement with albumin but antibiotics and steroids also resumed 3/16 nephrology evaluated and decision made to start back CRRT.  Repeat CT chest abdomen pelvis with ongoing solid pancreatitis and mediastinal adenopathy  Interim History / Subjective:    Objective   Blood pressure 113/63, pulse (!) 110, temperature 99.2 F (37.3 C), temperature source Axillary, resp. rate 16, height 6' (1.829 m), weight 84 kg, SpO2 98%.        Intake/Output Summary (Last 24 hours) at 07/15/2023 1610 Last data filed at 07/15/2023 0700 Gross per 24 hour  Intake 2542.59 ml  Output 905 ml  Net 1637.59 ml   Filed Weights   07/13/23 0500 07/14/23 0500 07/15/23 0308  Weight: 80.3 kg 81.1 kg 84 kg    Examination: General: Acute on chronic ill-appearing deconditioned middle-aged male lying in bed in no acute distress HEENT: [place]/AT, MM pink/moist, PERRL,  Neuro: Awakes to verbal stimuli, less responsive today  CV: s1s2 regular rate and rhythm, no murmur, rubs, or gallops,  PULM:  Diminished  bilaterally, no increased work of breathing, no added breath sounds  GI: soft, bowel sounds active in all 4 quadrants, non-tender, non-distended, tolerating TF Extremities: warm/dry, no edema  Skin: no rashes or lesions  Resolved Hospital Problem list   Lactic acid Acute hypoxic respiratory failure resolved extubated 3/12 Severe sepsis with septic shock, POA now resolved Aspiration pneumonia -Completed 7 days of IV Zosyn 3/14  Assessment & Plan:   Shock -improved -Appears patient had shown slow improvement with ability to discontinue vasopressors and stress dose steroids over the last 2.  However unfortunately overnight 3/15 blood pressure precipitously dropped requiring resumption of vasopressor support now requiring 15 of Levophed and vasopressin.  Patient volume resuscitated and antibiotics resumed with resolution of shock state by 3/16 P: Continue IV Zosyn Continue steroids Trend CBC and fever curve  Acute on chronic pancreatitis secondary to gallstone versus alcohol use -Lipase downtrending but remains elevated -Repeat chest abdomen pelvis 3/15 with persistent mediastinal adenopathy and enlarged edematous pancreas with surrounding inflammation compatible with persistent acute pancreatitis History of pancreatic divisum on MRCP Acute on chronic liver failure Alcoholic cirrhosis -MELD 35 Coagulopathy of liver disease P: GI reconsulted  Repeat CT as above Discussed with attending need to reengage GI Question need for MRCP  Thrombocytopenia of liver disease and critical illness anemia of chronic illness P: Trend CBC  Transfuse per protocol Plt goal > 10 Hemoglobin goal greater than 7  Acute encephalopathy, combined metabolic and hepatic, improving P: Maintain neuroprotective measures Nutrition and bowel regiment Pression precautions Delirium precautions  AKI on CKD stage IIIb, was on CRRT which was stopped.  Due for IHD today -CRRT utilized 3/9 to 3/12 -CRRT resumed  3/16 P: Nephrology following, appreciate assistance Resumption of CRRT planned for today Monitor urine output  Recurrent hypoglycemia now with progressively hyperglycemia P: Advance SSI to moderate scale CBG goal 140-180 CBG checks every 4  Possible mediastinal mass P: Supportive care, once medically optimized can consider outpatient evaluation  Severe protein calorie malnutrition P: Continue tube feeds  Goals of care Patient met with palliative care 3/14 with decision made to change CODE STATUS to DO NOT RESUSCITATE but continue all other aggressive interventions  Best Practice (right click and "Reselect all SmartList Selections" daily)   Diet: Cotrak today then tube feeds DVT prophylaxis prophylactic heparin  Pressure ulcer(s): N/A GI prophylaxis: PPI Lines: Discontinue arterial line, central line.  HD catheter needs to be in for now Foley: Discontinue Code Status: DNR Last date of multidisciplinary goals of care discussion [palliative care is following]   Amrie Gurganus D. Harris, NP-C [facility] Personal contact information can be found on Amion  If no contact or response made please call [phone] 07/15/2023, 9:38 AM

## 2023-07-15 NOTE — Plan of Care (Signed)
  Problem: Education: Goal: Knowledge of General Education information will improve Description: Including pain rating scale, medication(s)/side effects and non-pharmacologic comfort measures Outcome: Progressing   Problem: Health Behavior/Discharge Planning: Goal: Ability to manage health-related needs will improve Outcome: Progressing   Problem: Clinical Measurements: Goal: Ability to maintain clinical measurements within normal limits will improve Outcome: Progressing Goal: Will remain free from infection Outcome: Progressing Goal: Diagnostic test results will improve Outcome: Progressing Goal: Respiratory complications will improve Outcome: Progressing Goal: Cardiovascular complication will be avoided Outcome: Progressing   Problem: Activity: Goal: Risk for activity intolerance will decrease Outcome: Progressing   Problem: Nutrition: Goal: Adequate nutrition will be maintained Outcome: Progressing   Problem: Coping: Goal: Level of anxiety will decrease Outcome: Progressing   Problem: Elimination: Goal: Will not experience complications related to bowel motility Outcome: Progressing Goal: Will not experience complications related to urinary retention Outcome: Progressing   Problem: Pain Managment: Goal: General experience of comfort will improve and/or be controlled Outcome: Progressing   Problem: Safety: Goal: Ability to remain free from injury will improve Outcome: Progressing   Problem: Skin Integrity: Goal: Risk for impaired skin integrity will decrease Outcome: Progressing   Problem: Safety: Goal: Non-violent Restraint(s) Outcome: Progressing   Problem: Education: Goal: Ability to describe self-care measures that may prevent or decrease complications (Diabetes Survival Skills Education) will improve Outcome: Progressing Goal: Individualized Educational Video(s) Outcome: Progressing   Problem: Coping: Goal: Ability to adjust to condition or change  in health will improve Outcome: Progressing   Problem: Fluid Volume: Goal: Ability to maintain a balanced intake and output will improve Outcome: Progressing   Problem: Health Behavior/Discharge Planning: Goal: Ability to identify and utilize available resources and services will improve Outcome: Progressing Goal: Ability to manage health-related needs will improve Outcome: Progressing   Problem: Metabolic: Goal: Ability to maintain appropriate glucose levels will improve Outcome: Progressing   Problem: Nutritional: Goal: Maintenance of adequate nutrition will improve Outcome: Progressing Goal: Progress toward achieving an optimal weight will improve Outcome: Progressing   Problem: Skin Integrity: Goal: Risk for impaired skin integrity will decrease Outcome: Progressing   Problem: Tissue Perfusion: Goal: Adequacy of tissue perfusion will improve Outcome: Progressing

## 2023-07-16 DIAGNOSIS — K852 Alcohol induced acute pancreatitis without necrosis or infection: Secondary | ICD-10-CM | POA: Diagnosis not present

## 2023-07-16 DIAGNOSIS — K851 Biliary acute pancreatitis without necrosis or infection: Secondary | ICD-10-CM | POA: Diagnosis not present

## 2023-07-16 DIAGNOSIS — A419 Sepsis, unspecified organism: Secondary | ICD-10-CM | POA: Diagnosis not present

## 2023-07-16 DIAGNOSIS — Z515 Encounter for palliative care: Secondary | ICD-10-CM | POA: Diagnosis not present

## 2023-07-16 DIAGNOSIS — K7031 Alcoholic cirrhosis of liver with ascites: Secondary | ICD-10-CM | POA: Diagnosis not present

## 2023-07-16 DIAGNOSIS — N179 Acute kidney failure, unspecified: Secondary | ICD-10-CM | POA: Diagnosis not present

## 2023-07-16 DIAGNOSIS — R1084 Generalized abdominal pain: Secondary | ICD-10-CM | POA: Diagnosis not present

## 2023-07-16 DIAGNOSIS — K8521 Alcohol induced acute pancreatitis with uninfected necrosis: Secondary | ICD-10-CM

## 2023-07-16 DIAGNOSIS — F1090 Alcohol use, unspecified, uncomplicated: Secondary | ICD-10-CM | POA: Diagnosis not present

## 2023-07-16 DIAGNOSIS — R6521 Severe sepsis with septic shock: Secondary | ICD-10-CM | POA: Diagnosis not present

## 2023-07-16 DIAGNOSIS — Z711 Person with feared health complaint in whom no diagnosis is made: Secondary | ICD-10-CM

## 2023-07-16 LAB — GLUCOSE, CAPILLARY
Glucose-Capillary: 153 mg/dL — ABNORMAL HIGH (ref 70–99)
Glucose-Capillary: 180 mg/dL — ABNORMAL HIGH (ref 70–99)
Glucose-Capillary: 158 mg/dL — ABNORMAL HIGH (ref 70–99)
Glucose-Capillary: 172 mg/dL — ABNORMAL HIGH (ref 70–99)
Glucose-Capillary: 203 mg/dL — ABNORMAL HIGH (ref 70–99)

## 2023-07-16 LAB — CBC
HCT: 26 % — ABNORMAL LOW (ref 39.0–52.0)
Hemoglobin: 9.4 g/dL — ABNORMAL LOW (ref 13.0–17.0)
MCH: 31 pg (ref 26.0–34.0)
MCHC: 36.2 g/dL — ABNORMAL HIGH (ref 30.0–36.0)
MCV: 85.8 fL (ref 80.0–100.0)
Platelets: 102 10*3/uL — ABNORMAL LOW (ref 150–400)
RBC: 3.03 MIL/uL — ABNORMAL LOW (ref 4.22–5.81)
RDW: 17.8 % — ABNORMAL HIGH (ref 11.5–15.5)
WBC: 26.4 10*3/uL — ABNORMAL HIGH (ref 4.0–10.5)
nRBC: 0.1 % (ref 0.0–0.2)

## 2023-07-16 LAB — RENAL FUNCTION PANEL
Albumin: 2 g/dL — ABNORMAL LOW (ref 3.5–5.0)
Albumin: 2 g/dL — ABNORMAL LOW (ref 3.5–5.0)
Anion gap: 9 (ref 5–15)
Anion gap: 10 (ref 5–15)
BUN: 39 mg/dL — ABNORMAL HIGH (ref 6–20)
BUN: 36 mg/dL — ABNORMAL HIGH (ref 6–20)
CO2: 22 mmol/L (ref 22–32)
CO2: 22 mmol/L (ref 22–32)
Calcium: 7.6 mg/dL — ABNORMAL LOW (ref 8.9–10.3)
Calcium: 7.4 mg/dL — ABNORMAL LOW (ref 8.9–10.3)
Chloride: 103 mmol/L (ref 98–111)
Chloride: 101 mmol/L (ref 98–111)
Creatinine, Ser: 2.66 mg/dL — ABNORMAL HIGH (ref 0.61–1.24)
Creatinine, Ser: 2.68 mg/dL — ABNORMAL HIGH (ref 0.61–1.24)
GFR, Estimated: 27 mL/min — ABNORMAL LOW (ref >60)
GFR, Estimated: 27 mL/min — ABNORMAL LOW (ref >60)
Glucose, Bld: 185 mg/dL — ABNORMAL HIGH (ref 70–99)
Glucose, Bld: 181 mg/dL — ABNORMAL HIGH (ref 70–99)
Phosphorus: 3.1 mg/dL (ref 2.5–4.6)
Phosphorus: 3.1 mg/dL (ref 2.5–4.6)
Potassium: 3.8 mmol/L (ref 3.5–5.1)
Potassium: 3.7 mmol/L (ref 3.5–5.1)
Sodium: 134 mmol/L — ABNORMAL LOW (ref 135–145)
Sodium: 133 mmol/L — ABNORMAL LOW (ref 135–145)

## 2023-07-16 LAB — PHOSPHORUS: Phosphorus: 3.3 mg/dL (ref 2.5–4.6)

## 2023-07-16 LAB — PROTIME-INR
INR: 1.9 — ABNORMAL HIGH (ref 0.8–1.2)
Prothrombin Time: 22.1 s — ABNORMAL HIGH (ref 11.4–15.2)

## 2023-07-16 LAB — LIPASE, BLOOD: Lipase: 211 U/L — ABNORMAL HIGH (ref 11–51)

## 2023-07-16 LAB — MAGNESIUM: Magnesium: 2.4 mg/dL (ref 1.7–2.4)

## 2023-07-16 LAB — PROCALCITONIN: Procalcitonin: 6.46 ng/mL

## 2023-07-16 MED ORDER — NOREPINEPHRINE 4 MG/250ML-% IV SOLN: 0.0000 ug/min | INTRAVENOUS | Status: DC

## 2023-07-16 MED ORDER — NOREPINEPHRINE 16 MG/250ML-% IV SOLN
0.0000 ug/min | INTRAVENOUS | Status: DC
Start: 2023-07-16 — End: 2023-07-23
  Administered 2023-07-16 – 2023-07-17 (×2): 2 ug/min via INTRAVENOUS
  Administered 2023-07-19: 6 ug/min via INTRAVENOUS
  Administered 2023-07-21: 20.053 ug/min via INTRAVENOUS
  Administered 2023-07-21: 12 ug/min via INTRAVENOUS
  Administered 2023-07-22: 12.053 ug/min via INTRAVENOUS
  Filled 2023-07-16 (×5): qty 250

## 2023-07-16 MED ORDER — PROSOURCE TF20 ENFIT COMPATIBL EN LIQD
60.0000 mL | Freq: Four times a day (QID) | ENTERAL | Status: DC
  Administered 2023-07-16 – 2023-07-20 (×15): 60 mL
  Filled 2023-07-16 (×15): qty 60

## 2023-07-16 MED ORDER — VITAL 1.5 CAL PO LIQD
1000.0000 mL | ORAL | Status: DC
  Administered 2023-07-16 – 2023-07-19 (×4): 1000 mL

## 2023-07-16 NOTE — TOC Progression Note (Signed)
 Transition of Care [facility]) - Progression Note    Patient Details  Name: [name] [name] MRN: [id number] Date of Birth: 27-Oct-1965  [facility] CM/SW Contact  Tom-Johnson, Hershal Coria, RN Phone Number: 07/16/2023, 1:07 PM  Clinical Narrative:     Palliative had a GOC f/u meeting with patient's sister. Per Palliative, sister is considering Comfort Care but she does not want to make final decisions at this time without the approval of two other family members, requesting another family meeting with PMT on Wednesday 07/18/23.  Current plan of care continues.   CM will continue to follow.         Barriers to Discharge: Continued Medical Work up  Expected Discharge Plan and Services       Living arrangements for the past 2 months: Single Family Home                                       Social Determinants of Health (SDOH) Interventions SDOH Screenings   Food Insecurity: No Food Insecurity (07/06/2023)  Housing: Low Risk  (07/06/2023)  Transportation Needs: No Transportation Needs (07/06/2023)  Utilities: Not At Risk (07/06/2023)  Alcohol Screen: Low Risk  (09/13/2020)  Financial Resource Strain: High Risk (11/28/2021)   [facility]  Physical Activity: Sufficiently Active (09/13/2020)  Social Connections: Moderately Isolated (09/13/2020)  Stress: No Stress Concern Present (09/13/2020)  Tobacco Use: High Risk (07/05/2023)    Readmission Risk Interventions    03/13/2023    2:50 PM 05/22/2022    9:08 AM  Readmission Risk Prevention Plan  Transportation Screening Complete Complete  HRI or Home Care Consult Complete Complete  Social Work Consult for Recovery Care Planning/Counseling Complete Complete  Palliative Care Screening Not Applicable Not Applicable  Medication Review Oceanographer) Complete Complete

## 2023-07-16 NOTE — Progress Notes (Signed)
 NAME:  [name] [name], MRN:  [id number], DOB:  03/04/1966, LOS: 11 ADMISSION DATE:  07/05/2023, CONSULTATION DATE:  07/06/2023 REFERRING MD:  Randye Lobo, CHIEF COMPLAINT:  Abdominal Pain   History of Present Illness:  [name] is a 58 year old gentleman with a known medical history significant for hypertension, COPD, alcoholic liver cirrhosis, hemosiderosis, CKD 3B, dyslipidemia, prior left lower extremity DVT presenting to [facility] health ED secondary to 3 to 4-day complaint of abdominal pain.  Patient describes bilateral flank type pain increasing in intensity without radiation to groin chest or back.  He states this is similar pain to his previous admission for pancreatitis.  Patient does elicit nausea and vomiting and diarrhea.   It is noted that the patient does have history of cholelithiasis and decompensated cirrhosis.  Patient reports he has not had any alcohol since his birthday on [birth year], however according to GI note he made comments that his last drink was on this past Sunday.  CT of the abdomen pelvis without contrast performed on July 05, 2023 reveals atrophic left kidney, gallstones and diffuse pancreatitis  Abdomen Limited ultrasound performed today 07/06/2023 reveals no significant abdominal ascites.  Laboratory indices: He does have elevation in coagulation studies with a PT of 26 on yesterday that elevated to 50.4 today 3 10/31/2023 and INR at admission yesterday 2.4 elevated to 5.5 today.  CBC: WBC 15.3, hemoglobin 11, hematocrit 36.7, platelets 164.  BMP sodium 132, potassium 5.8, chloride 99, CO2 14 BUN 40, creatinine 4.81 and glucose 214.  LFTs: Lipase 2929, AST 5021, ALT 05/08/2007.  Secondary to patient's known CKD presenting now with AKI and presence of oliguria a temporary HD line was placed by surgery team today for hemodialysis.  Patient underwent 2-hour HD in the ED prior to transfer.  Patient did receive IV fluids for pancreatitis management, as he is a poor candidate for  liver transplant due to ongoing alcohol use per GI note.  Consults: Patient has been followed by nephrology and gastrointestinal services Blood cultures sent 07/06/2023 secondary to leukocytosis.  As such patient was transferred to [facility] for ICU monitoring and management and admitted with acute on chronic liver failure/pancreatitis.  Pertinent  Medical History  Asthma, B12 deficiency, cirrhosis, dyspnea, hyperlipidemia, history of kidney stones, hypertension, kidney stones, COPD, prior lower left extremity DVT, chronic anemia.  Significant Hospital Events: Including procedures, antibiotic start and stop dates in addition to other pertinent events   3/7 Hemodialysis  (temporary HD line placed) s/p short HD, tx to [facility] 3/8 worsening shock, pressors, CTH/ CT a/p 3/9 intubated. On 3 pressors. Renal fxn and lactic acidosis worse. Started CRRT 3/10 LFTs a little better. Still on 3 pressors. Comfortable efforts on PSV but mental status not amendable to extubation. ECHO EF 55% no WMA, grade I diastolic dysfxn, started stress dose steroids. Right internal jugular CVL placed. The epi was weaned off over course of am after stress dose steroids started, NE weaning stopped fent gtt changed to PRN. D10 rate reduced to 10cc/hr. Acetylcysteine completed  3/11 vasopressin discontinued on very low-dose norepinephrine starting to wean sedation further to assess for readiness to wean ventilation but not responding and appeared to have left gaze pref after sedation off for prolonged time. CT head obtained no acute findings. Abx switched to zosyn 3/12 woke up during evening. Agitated at times. Attempting to get OOB. Still seeming to have left sided weakness but exam much closer to that of the 10th. Norepi off.  Extubated.  Taken off  CRRT, encephalopathic but tolerated extubation 3/13 agitated during the evening hours, hypertensive and tachycardic moaning.  Started on Precedex.  This was discontinued on a.m. rounds.   Remains off pressors remains off dialysis minimal urine output.  Had a goals of care discussion with palliative and critical care.  Critical care recommending DO NOT RESUSCITATE, also shared with family patient's appropriateness for hospice going forward.  Family taking CODE STATUS into advisement with plan to follow-up 3/14 Patient was agitated overnight, pulled out his core track. Off vasopressors 3/15 Back on pressors including levo and vaso, likely secondary to volume depletion given improvement with albumin but antibiotics and steroids also resumed 3/16 nephrology evaluated and decision made to start back CRRT.  Repeat CT chest abdomen pelvis with ongoing solid pancreatitis and mediastinal adenopathy  Interim History / Subjective:   CRRT resumed yesterday due to hypotension GI consult appreciated Mild agitation overnight, remains critically ill, on CRRT, off pressors   Objective   Blood pressure (!) 94/57, pulse (!) 126, temperature 99.5 F (37.5 C), temperature source Axillary, resp. rate 19, height 6' (1.829 m), weight 84 kg, SpO2 97%.        Intake/Output Summary (Last 24 hours) at 07/16/2023 0951 Last data filed at 07/16/2023 1610 Gross per 24 hour  Intake 1844.84 ml  Output 1871.3 ml  Net -26.46 ml   Filed Weights   07/13/23 0500 07/14/23 0500 07/15/23 0308  Weight: 80.3 kg 81.1 kg 84 kg    Examination: General: Acute on chronic ill-appearing deconditioned middle-aged male lying in bed in no acute distress HEENT: [place]/AT, MM pink/moist, PERRL,  Neuro: Lethargic but awakens easily and follows commands, able to tell me his address and phone number but not oriented to place or time CV: s1s2 regular rate and rhythm, no murmur, rubs, or gallops,  PULM: Decreased sounds bilateral, no accessory muscle use GI: soft, distended, non-tender,tolerating TF Extremities: warm/dry, no edema  Skin: no rashes or lesions  Labs show persistent leukocytosis, improving  thrombocytopenia  Resolved Hospital Problem list   Lactic acid Acute hypoxic respiratory failure resolved extubated 3/12 Severe sepsis with septic shock, POA now resolved Aspiration pneumonia -Completed 7 days of IV Zosyn 3/14  Assessment & Plan:   Septic versus vasodilatory shock -culture-negative -Initially resolved, back on pressors 3/15-3/16 P: Continue IV Zosyn -7 to 10 days total Off steroids, remains off pressors Continue midodrine Trend CBC and fever curve  Acute on chronic pancreatitis secondary to gallstone versus alcohol use -Lipase downtrending but remains elevated -Repeat chest abdomen pelvis 3/15 with persistent mediastinal adenopathy and enlarged edematous pancreas with surrounding inflammation compatible with persistent acute pancreatitis History of pancreatic divisum on MRCP Acute on chronic liver failure Alcoholic cirrhosis -MELD 35 Coagulopathy of liver disease P: Question need for MRCP -doubt gallstone pancreatitis so do not think this would be helpful  Mediastinal lymphadenopathy -Not a candidate at this time for invasive biopsy  Thrombocytopenia of liver disease and critical illness anemia of chronic illness, improving P: Trend CBC  Transfuse per protocol Hemoglobin goal greater than 7  Acute encephalopathy, combined metabolic and hepatic, improving P: Continue lactulose Delirium precautions Continue Seroquel  AKI on CKD stage IIIb, was on CRRT which was stopped.  Due for IHD today -CRRT utilized 3/9 to 3/12 -CRRT resumed 3/16 P: Nephrology following, appreciate assistance Continue CRRT for volume removal Monitor urine output  Recurrent hypoglycemia now with progressively hyperglycemia P: Advance SSI to moderate scale CBG goal 140-180 CBG checks every 4   Severe protein calorie  malnutrition P: Continue tube feeds  Goals of care Patient met with palliative care 3/14 with decision made to change CODE STATUS to DO NOT RESUSCITATE but  continue all other aggressive interventions  Best Practice (right click and "Reselect all SmartList Selections" daily)   Diet: Cotrak ,tube feeds DVT prophylaxis prophylactic heparin  Pressure ulcer(s): N/A GI prophylaxis: PPI Lines: central line.  Femoral HD catheter  Foley: Discontinue Code Status: DNR Last date of multidisciplinary goals of care discussion [palliative care is following]   My independent critical care time was 33 minutes   Cyril Mourning MD. FCCP. [facility] Pager : [phone]  If no response to pager , please call [phone] until 7 pm After 7:00 pm call Elink  [phone]    07/16/2023, 9:51 AM

## 2023-07-16 NOTE — Progress Notes (Signed)
 Daily Progress Note  DOA: 07/05/2023 Hospital Day: 12   Chief Complaint: Decompensated alcohol related cirrhosis and acute severe pancreatitis  ASSESSMENT    58 year old seen by [place] in consult yesterday. He was transferred from [facility] with pancreatitis, worsening of chronic liver diease,  shock, respiratory failure requiring intubation and AKI requiring CRRT. Shock had improved with antibiotics and steroids but after discontinuation he had another decline in condition necessitating resumption of pressors  Septic vr vasodilatory shock / Severe pancreatitis ( Etoh vrs gallstone related).  In setting of severe pancreatitis he could have developed peripancreatic fluid collections which have become infected.  Liver tests elevated but not necessarily significant in setting of decompensated cirrhosis. Apparently MRCP wasn't able to be done?   Decompensated Etoh related cirrhosis with ascites, encephalopathy, coagulopathy.  INR stable at 1.9 post Vit K  Gastric, small bowel and colon wall thickening on non contrast CT scan 3/6.  Probably related to ascites / hypoalbuminemia  AKI on CKD. On CRRT. Felt to be poor candidate for long term HD.   Mediastinal adenopathy on non-contrast CT scan. Concerning for possible metastasis or lymphoma   Principal Problem:   Acute pancreatitis Active Problems:   Alcoholic cirrhosis (HCC)   Shock liver   Need for acute hemodialysis (HCC)   Acute biliary pancreatitis   Coagulopathy (HCC)   Somnolence   Septic shock (HCC)   Elevated liver enzymes   Palliative care by specialist   Goals of care, counseling/discussion   Protein-calorie malnutrition, severe   PLAN   --CT scan with contrast would be helpful to evaluate for any peripancreatic fluid collection. I can reach out to Nephrology to get their thoughts on contrast. However, it appears that the family is considering comfort care ( Palliative care / family meeting on Wed).  --Abdomen is  distended. He has ascites but suspect also developing an ileus. Monitor for now. Continue tube feeds. May need to reduced or stop lactulose and treat with xifaxan.    Objective    Recent Labs    07/15/23 0500 07/16/23 0334  WBC 25.7* 26.4*  HGB 8.9* 9.4*  HCT 25.1* 26.0*  PLT 74* 102*   BMET Recent Labs    07/14/23 0230 07/15/23 0500 07/15/23 1536  NA 134* 134*  134* 134*  K 3.7 3.6  3.6 3.3*  CL 103 101  100 104  CO2 17* 18*  18* 21*  GLUCOSE 190* 237*  235* 165*  BUN 69* 87*  86* 71*  CREATININE 4.53* 5.16*  5.13* 4.10*  CALCIUM 7.3* 7.5*  7.6* 7.6*   LFT Recent Labs    07/14/23 1509 07/15/23 0500 07/15/23 1536  PROT 7.0 6.9  --   ALBUMIN 2.1* 2.5*  2.5* 2.6*  AST 120* 91*  --   ALT 139* 109*  --   ALKPHOS 185* 153*  --   BILITOT 14.8* 17.2*  --   BILIDIR 9.4*  --   --   IBILI 5.4*  --   --    PT/INR Recent Labs    07/15/23 0500 07/16/23 0334  LABPROT 23.5* 22.1*  INR 2.1* 1.9*     Imaging:  CT CHEST ABDOMEN PELVIS WO CONTRAST CLINICAL DATA:  Sepsis  EXAM: CT CHEST, ABDOMEN AND PELVIS WITHOUT CONTRAST  TECHNIQUE: Multidetector CT imaging of the chest, abdomen and pelvis was performed following the standard protocol without IV contrast.  RADIATION DOSE REDUCTION: This exam was performed according to the departmental dose-optimization program which  includes automated exposure control, adjustment of the mA and/or kV according to patient size and/or use of iterative reconstruction technique.  COMPARISON:  07/07/2023  FINDINGS: CT CHEST FINDINGS  Cardiovascular: Heart is normal size. Aorta is normal caliber. Moderate coronary artery and aortic atherosclerosis.  Mediastinum/Nodes: Mediastinal adenopathy. Right paratracheal lymph node has a short axis diameter of 3 cm. Subcarinal adenopathy has a short axis diameter of 3.2 cm. Concern for right hilar adenopathy although difficult to evaluate without intravenous contrast.  No axillary adenopathy. Trachea and esophagus are unremarkable. Thyroid unremarkable.  Lungs/Pleura: Small bilateral pleural effusions. Dependent atelectasis in the lower lobes. Mild vascular congestion. Patchy ground-glass opacities anteriorly in the right upper lobe and lingula could reflect early pneumonia.  Musculoskeletal: Bilateral gynecomastia.  No acute bony abnormality.  CT ABDOMEN PELVIS FINDINGS  Hepatobiliary: High-density material noted within the gallbladder could reflect small stones or sludge. No focal hepatic abnormality.  Pancreas: Pancreas appears enlarged. Surrounding edema compatible with acute pancreatitis.  Spleen: No focal abnormality.  Normal size.  Adrenals/Urinary Tract: Adrenal glands normal. Atrophic left kidney. No stones or hydronephrosis bilaterally. Urinary bladder decompressed with Foley catheter in place.  Stomach/Bowel: Scattered colonic diverticulosis. No active diverticulitis. NG tube is in place with the tip in the duodenal bulb. Small bowel decompressed.  Vascular/Lymphatic: Aortic atherosclerosis. No evidence of aneurysm or adenopathy.  Reproductive: No visible focal abnormality.  Other: Moderate ascites in the abdomen and pelvis. No free air. No retroperitoneal hemorrhage.  Musculoskeletal: No acute bony abnormality. Anasarca throughout the subcutaneous soft tissues.  IMPRESSION: Mediastinal adenopathy. This is concerning for possible metastasis or lymphoma.  Coronary artery disease, aortic atherosclerosis.  Small bilateral pleural effusions with dependent atelectasis in the lower lobes. Patchy ground-glass opacities in the upper lobes could reflect pneumonia.  Enlarged edematous pancreas with surrounding inflammation compatible with acute pancreatitis.  Moderate ascites.  Electronically Signed   By: Charlett Nose M.D.   On: 07/14/2023 21:33     Scheduled inpatient medications:   arformoterol  15 mcg Nebulization  BID   budesonide (PULMICORT) nebulizer solution  0.5 mg Nebulization BID   Chlorhexidine Gluconate Cloth  6 each Topical Q0600   feeding supplement (PROSource TF20)  60 mL Per Tube QID   folic acid  1 mg Per Tube Daily   insulin aspart  0-15 Units Subcutaneous Q4H   lactulose  10 g Per Tube BID   midodrine  10 mg Per Tube Q8H   nicotine  14 mg Transdermal Daily   mouth rinse  15 mL Mouth Rinse 4 times per day   pantoprazole (PROTONIX) IV  40 mg Intravenous Q24H   QUEtiapine  50 mg Per Tube BID   thiamine  100 mg Per Tube Daily   Continuous inpatient infusions:   feeding supplement (VITAL 1.5 CAL)     norepinephrine (LEVOPHED) Adult infusion 3 mcg/min (07/16/23 1400)   prismasol BGK 4/2.5 400 mL/hr at 07/16/23 1119   prismasol BGK 4/2.5 400 mL/hr at 07/16/23 1119   prismasol BGK 4/2.5 1,500 mL/hr at 07/16/23 1256   PRN inpatient medications: alteplase, haloperidol lactate, ipratropium-albuterol, [DISCONTINUED] ondansetron **OR** ondansetron (ZOFRAN) IV, mouth rinse, mouth rinse, oxyCODONE, sodium chloride  Vital signs in last 24 hours: Temp:  [98.4 F (36.9 C)-99.5 F (37.5 C)] 99 F (37.2 C) (03/17 1130) Pulse Rate:  [112-126] 122 (03/17 1230) Resp:  [12-21] 18 (03/17 1230) BP: (82-137)/(47-71) 113/62 (03/17 1230) SpO2:  [95 %-100 %] 98 % (03/17 1230) Last BM Date : 07/16/23  Intake/Output Summary (  Last 24 hours) at 07/16/2023 1429 Last data filed at 07/16/2023 1400 Gross per 24 hour  Intake 1687.79 ml  Output 1847.3 ml  Net -159.51 ml    Intake/Output from previous day: 03/16 0701 - 03/17 0700 In: 1736 [I.V.:17.5; YQ/MV:7846.9; IV Piggyback:269.2] Out: 1893.3 [Urine:245; Stool:500] Intake/Output this shift: Total I/O In: 578.4 [I.V.:8.4; NG/GT:570] Out: 568 [Urine:8]   Physical Exam:  General: Alert confused male in NAD Heart:  Regular rate and rhythm.  Pulmonary: Normal respiratory effort Abdomen: Soft, distended, nontender, tympanitic, "metallic" bowel sounds.   Psych: Confused but cooperative     LOS: 11 days   Willette Cluster ,NP 07/16/2023, 2:29 PM

## 2023-07-16 NOTE — Progress Notes (Signed)
 [facility] NEPHROLOGY PROGRESS NOTE  Assessment/ Plan: Pt is a 58 y.o. 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   # Dialysis dependent anuric AKI on CKD3b likely ischemic ATN, started HD 07/06/2023 using femoral temp HD catheter placed by surgery at [facility]. - CRRT 3/9-3/12.  Remains extubated, more alert but confused.  Not much urine output.  BUN and crt rising daily  off of CRRT .   Plan was for HD on 3/15 but then pressors  resumed so we put on hold.  I started some lasix to see if can induce some UOP- not much impact --stopped.  Noted palliative care discussion about goals of care-  no decisions made as of yet re dialysis . Hypotensive and didn't respond to fluid challenge. CRRT back on 3/16, on midodrine.  VERY poor candidate for long term HD-  MELD gives a high level of mortality and with his confusion is not ideal-  he is looking at SNF and again would not recommend long term dialysis with associated poor quality of life.    # Septic shock: Off of pressors  #Severe gallstone pancreatitis with transaminitis: Per critical care.  #Anion gap metabolic acidosis/lactic acidosis: Improving.  # Hyperkalemia resolved  Anemia-  had not been a major issue -  but now dropped 2 grams-  concern for [facility] but did not have on imaging-  supportive care    Palliative has engaged with family -  is DNR but no other limits of care have been placed  Subjective:  Urine output  oliguric range currently on CRRT. Confused but not agitated.    Objective Vital signs in last 24 hours: Vitals:   07/16/23 0800 07/16/23 0830 07/16/23 0900 07/16/23 0930  BP: (!) 113/59 (!) 86/50 (!) 82/47 (!) 94/57  Pulse: (!) 124 (!) 122 (!) 125 (!) 126  Resp: 16 17 18 19   Temp:      TempSrc:      SpO2: 97% 98% 98% 97%  Weight:      Height:       Weight change:   Intake/Output  Summary (Last 24 hours) at 07/16/2023 0953 Last data filed at 07/16/2023 4098 Gross per 24 hour  Intake 1844.84 ml  Output 1871.3 ml  Net -26.46 ml       Labs: RENAL PANEL Recent Labs  Lab 07/12/23 0325 07/12/23 0800 07/13/23 0547 07/13/23 0943 07/14/23 0230 07/14/23 1013 07/14/23 1509 07/14/23 1809 07/15/23 0500 07/15/23 1536 07/15/23 1751 07/16/23 0334  NA 135  --  132*  --  134*  --   --   --  134*  134* 134*  --   --   K 3.7  --  3.4*  --  3.7  --   --   --  3.6  3.6 3.3*  --   --   CL 103  --  102  --  103  --   --   --  101  100 104  --   --   CO2 21*  --  20*  --  17*  --   --   --  18*  18* 21*  --   --   GLUCOSE 134*  --  133*  --  190*  --   --   --  237*  235* 165*  --   --   BUN 29*  --  52*  --  69*  --   --   --  87*  86* 71*  --   --   CREATININE 2.30*  --  3.35*  --  4.53*  --   --   --  5.16*  5.13* 4.10*  --   --   CALCIUM 7.2*  --  7.3*  --  7.3*  --   --   --  7.5*  7.6* 7.6*  --   --   MG 2.4   < > 2.6*   < > 2.6* 2.5*  --  2.6* 2.6*  --   --  2.4  PHOS 2.8   < > 3.4  3.5   < > 4.0  3.8  --  3.6  --  4.4  4.4 3.7 3.8 3.3  ALBUMIN 1.8*   < > 1.8*  1.8*  --  1.7*  --  2.1*  --  2.5*  2.5* 2.6*  --   --    < > = values in this interval not displayed.    Liver Function Tests: Recent Labs  Lab 07/13/23 0547 07/14/23 0230 07/14/23 1509 07/15/23 0500 07/15/23 1536  AST 161*  --  120* 91*  --   ALT 224*  --  139* 109*  --   ALKPHOS 167*  --  185* 153*  --   BILITOT 13.3*  --  14.8* 17.2*  --   PROT 6.8  --  7.0 6.9  --   ALBUMIN 1.8*  1.8*   < > 2.1* 2.5*  2.5* 2.6*   < > = values in this interval not displayed.   Recent Labs  Lab 07/12/23 0800 07/13/23 0547 07/14/23 1509  LIPASE 101* 150* 302*   No results for input(s): "AMMONIA" in the last 168 hours.  CBC: Recent Labs    09/22/22 0926 09/22/22 0927 09/22/22 1003 09/29/22 1052 12/02/22 2225 03/19/23 1359 03/19/23 1400 03/26/23 0954 06/25/23 1008 07/05/23 0615  07/11/23 0346 07/12/23 0800 07/13/23 0547 07/15/23 0500 07/16/23 0334  HGB 12.9*  --    < >  --    < > 10.1*  --    < >  --    < > 9.3* 9.6* 10.5* 8.9* 9.4*  MCV 90.7  --    < >  --    < > 92.2  --    < >  --    < > 86.9 86.5 84.7 85.4 85.8  VITAMINB12  --  687  --   --   --   --  1,053*  --   --   --   --   --   --   --   --   FOLATE 13.2  --   --   --   --  6.3  --   --   --   --   --   --   --   --   --   FERRITIN  --  217  --  210  --   --  561*  --  218  --   --   --   --   --   --   TIBC  --  273  --  256  --   --  175*  --  224*  --   --   --   --   --   --   IRON  --  41*  --  61  --   --  27*  --  50  --   --   --   --   --   --    < > = values in this interval not displayed.    Cardiac Enzymes: No results for input(s): "CKTOTAL", "CKMB", "CKMBINDEX", "TROPONINI" in the last 168 hours. CBG: Recent Labs  Lab 07/15/23 1520 07/15/23 1933 07/15/23 2311 07/16/23 0330 07/16/23 0725  GLUCAP 148* 152* 159* 153* 180*    Iron Studies: No results for input(s): "IRON", "TIBC", "TRANSFERRIN", "FERRITIN" in the last 72 hours. Studies/Results: CT CHEST ABDOMEN PELVIS WO CONTRAST Result Date: 07/14/2023 CLINICAL DATA:  Sepsis EXAM: CT CHEST, ABDOMEN AND PELVIS WITHOUT CONTRAST TECHNIQUE: Multidetector CT imaging of the chest, abdomen and pelvis was performed following the standard protocol without IV contrast. RADIATION DOSE REDUCTION: This exam was performed according to the departmental dose-optimization program which includes automated exposure control, adjustment of the mA and/or kV according to patient size and/or use of iterative reconstruction technique. COMPARISON:  07/07/2023 FINDINGS: CT CHEST FINDINGS Cardiovascular: Heart is normal size. Aorta is normal caliber. Moderate coronary artery and aortic atherosclerosis. Mediastinum/Nodes: Mediastinal adenopathy. Right paratracheal lymph node has a short axis diameter of 3 cm. Subcarinal adenopathy has a short axis diameter of 3.2 cm.  Concern for right hilar adenopathy although difficult to evaluate without intravenous contrast. No axillary adenopathy. Trachea and esophagus are unremarkable. Thyroid unremarkable. Lungs/Pleura: Small bilateral pleural effusions. Dependent atelectasis in the lower lobes. Mild vascular congestion. Patchy ground-glass opacities anteriorly in the right upper lobe and lingula could reflect early pneumonia. Musculoskeletal: Bilateral gynecomastia.  No acute bony abnormality. CT ABDOMEN PELVIS FINDINGS Hepatobiliary: High-density material noted within the gallbladder could reflect small stones or sludge. No focal hepatic abnormality. Pancreas: Pancreas appears enlarged. Surrounding edema compatible with acute pancreatitis. Spleen: No focal abnormality.  Normal size. Adrenals/Urinary Tract: Adrenal glands normal. Atrophic left kidney. No stones or hydronephrosis bilaterally. Urinary bladder decompressed with Foley catheter in place. Stomach/Bowel: Scattered colonic diverticulosis. No active diverticulitis. NG tube is in place with the tip in the duodenal bulb. Small bowel decompressed. Vascular/Lymphatic: Aortic atherosclerosis. No evidence of aneurysm or adenopathy. Reproductive: No visible focal abnormality. Other: Moderate ascites in the abdomen and pelvis. No free air. No retroperitoneal hemorrhage. Musculoskeletal: No acute bony abnormality. Anasarca throughout the subcutaneous soft tissues. IMPRESSION: Mediastinal adenopathy. This is concerning for possible metastasis or lymphoma. Coronary artery disease, aortic atherosclerosis. Small bilateral pleural effusions with dependent atelectasis in the lower lobes. Patchy ground-glass opacities in the upper lobes could reflect pneumonia. Enlarged edematous pancreas with surrounding inflammation compatible with acute pancreatitis. Moderate ascites. Electronically Signed   By: Charlett Nose M.D.   On: 07/14/2023 21:33    Medications: Infusions:  feeding supplement (VITAL  1.5 CAL) 60 mL/hr at 07/16/23 0900   piperacillin-tazobactam Stopped (07/16/23 0533)   prismasol BGK 4/2.5 400 mL/hr at 07/15/23 2231   prismasol BGK 4/2.5 400 mL/hr at 07/15/23 2231   prismasol BGK 4/2.5 1,500 mL/hr at 07/16/23 2542    Scheduled Medications:  arformoterol  15 mcg Nebulization BID   budesonide (PULMICORT) nebulizer solution  0.5 mg Nebulization BID   Chlorhexidine Gluconate Cloth  6 each Topical Q0600   feeding supplement (PROSource TF20)  60 mL Per Tube Daily   folic acid  1 mg Per Tube Daily   insulin aspart  0-15 Units Subcutaneous Q4H   lactulose  10 g Per Tube BID   midodrine  10 mg Per Tube Q8H   nicotine  14 mg Transdermal Daily  mouth rinse  15 mL Mouth Rinse 4 times per day   pantoprazole (PROTONIX) IV  40 mg Intravenous Q24H   QUEtiapine  50 mg Per Tube BID   thiamine  100 mg Per Tube Daily    have reviewed scheduled and prn medications.  Physical Exam: General: resting-  because had been agitated did not wake  Heart:RRR, s1s2 nl Lungs: Clear bilateral. Abdomen:soft, non-distended Extremities + leg edema  Dialysis Access: temp HD line- .placed on 3/7  Akayla Brass W 07/16/2023,9:53 AM  LOS: 11 days

## 2023-07-16 NOTE — Progress Notes (Signed)
 Daily Progress Note   Patient Name: [name] [name]       Date: 07/16/2023 DOB: April 28, 1966  Age: 58 y.o. MRN#: [id number] Attending Physician: Oretha Milch, MD Primary Care Physician: Benetta Spar, MD Admit Date: 07/05/2023  Reason for Consultation/Follow-up: Establishing goals of care  Subjective: I have reviewed medical records including EPIC notes, MAR, any available advanced directives as necessary, and labs. Received report from primary RN - no acute concerns.   Went to visit patient at bedside - no family/visitors present. Patient was lying in bed - he does not respond to voice/gentle touch. No signs or non-verbal gestures of pain or discomfort noted. No respiratory distress, increased work of breathing, or secretions noted. He is ill and frail appearing. Coretrak and CRRT in use. Levo infusing.   10:53 AM Called patient's sister/[name] [name] - emotional support provided. She has recently returned to [facility]. Reviewed patient's interval history since last PMT discussions. [name] [name] endorses noting patient "appeared more tired yesterday" and stating he was not very interactive with family.   She has a clear understanding of patient's current acute medication situation. Discussed that he has been hospitalized with aggressive interventions for 11 days and unfortunately has not shown significant improvement; he's actually shown he is not stable enough to go long term without life supportive measures (pressors and CRRT). Discussed that nephrology feels he is a very poor candidate for long term HD. Education provided that ESRD and cirrhosis are non-curable, progressive diseases. She also understands patient is not a candidate for liver transplant.   Prognosis reviewed per her request. Reviewed no matter  which path is chosen (aggressive vs comfort) patient's life expectancy is short. Recommendation was given for transition to full comfort/hospice care.  We talked about transition to comfort measures in house and what that would entail inclusive of medications to control pain, dyspnea, agitation, nausea, and itching. We discussed stopping all unnecessary measures such as blood draws, needle sticks, oxygen, antibiotics, CBGs/insulin, cardiac monitoring, IVF, and frequent vital signs. Education provided that other non-pharmacological interventions would be utilized for holistic support and comfort such as spiritual support if requested, repositioning, music therapy, offering comfort feeds, and/or therapeutic listening. All care would focus on how the patient is looking and feeling.   [name] [name] tells me, "I know he's tired." She reiterates that family were hopeful  patient would be able to make his own decisions; however, realizes he likely will not be able to. She understand medical team will be looking to family to make decisions on patient's behalf. She requests another GOC family meeting to include her aunt/[name] [name] and likely her daughter prior to making any final decisions for comfort care. She requests this meeting on Wednesday 3/19. PMT will reach out to her tomorrow to confirm a time, after she has spoken with family.  Therapeutic listening provided as she reflects on a meaningful visit she had with patient a couple weeks ago when they could play board games. The patient enjoyed playing board games and taught her son how to play dominos.   For now, plan to continue current plan of care.   All questions and concerns addressed. Encouraged to call with questions and/or concerns. PMT number provided.  Length of Stay: 11  Current Medications: Scheduled Meds:   arformoterol  15 mcg Nebulization BID   budesonide (PULMICORT) nebulizer solution  0.5 mg Nebulization BID   Chlorhexidine Gluconate Cloth  6 each  Topical Q0600   feeding supplement (PROSource TF20)  60 mL Per Tube Daily   folic acid  1 mg Per Tube Daily   insulin aspart  0-15 Units Subcutaneous Q4H   lactulose  10 g Per Tube BID   midodrine  10 mg Per Tube Q8H   nicotine  14 mg Transdermal Daily   mouth rinse  15 mL Mouth Rinse 4 times per day   pantoprazole (PROTONIX) IV  40 mg Intravenous Q24H   QUEtiapine  50 mg Per Tube BID   thiamine  100 mg Per Tube Daily    Continuous Infusions:  feeding supplement (VITAL 1.5 CAL) 60 mL/hr at 07/16/23 1000   norepinephrine (LEVOPHED) Adult infusion 2 mcg/min (07/16/23 1014)   piperacillin-tazobactam Stopped (07/16/23 0533)   prismasol BGK 4/2.5 400 mL/hr at 07/15/23 2231   prismasol BGK 4/2.5 400 mL/hr at 07/15/23 2231   prismasol BGK 4/2.5 1,500 mL/hr at 07/16/23 0929    PRN Meds: alteplase, haloperidol lactate, heparin, ipratropium-albuterol, [DISCONTINUED] ondansetron **OR** ondansetron (ZOFRAN) IV, mouth rinse, mouth rinse, oxyCODONE, sodium chloride  Physical Exam Vitals and nursing note reviewed.  Constitutional:      General: He is not in acute distress.    Appearance: He is ill-appearing.  Pulmonary:     Effort: No respiratory distress.  Skin:    General: Skin is warm and dry.  Neurological:     Mental Status: He is lethargic.     Motor: Weakness present.             Vital Signs: BP (!) 94/57   Pulse (!) 126   Temp 99.5 F (37.5 C) (Axillary)   Resp 19   Ht 6' (1.829 m)   Wt 84 kg   SpO2 97%   BMI 25.12 kg/m  SpO2: SpO2: 97 % O2 Device: O2 Device: Room Air O2 Flow Rate: O2 Flow Rate (L/min): 2 L/min  Intake/output summary:  Intake/Output Summary (Last 24 hours) at 07/16/2023 1035 Last data filed at 07/16/2023 1000 Gross per 24 hour  Intake 1709.37 ml  Output 1931.3 ml  Net -221.93 ml   LBM: Last BM Date : 07/16/23 Baseline Weight: Weight: 73.9 kg Most recent weight: Weight: 84 kg       Palliative Assessment/Data: PPS 30% with tube  feeds      Patient Active Problem List   Diagnosis Date Noted   Protein-calorie malnutrition, severe 07/12/2023  Palliative care by specialist 07/10/2023   Goals of care, counseling/discussion 07/10/2023   Elevated liver enzymes 07/08/2023   Coagulopathy (HCC) 07/07/2023   Somnolence 07/07/2023   Septic shock (HCC) 07/07/2023   Shock liver 07/06/2023   Need for acute hemodialysis (HCC) 07/06/2023   Acute biliary pancreatitis 07/06/2023   Dog bite 03/12/2023   Acute pancreatitis 03/11/2023   Metabolic acidosis 09/25/2022   Bladder outlet obstruction 09/25/2022   Acute pancreatitis without infection or necrosis 05/24/2022   Alcohol-induced acute pancreatitis 05/23/2022   Cholelithiasis 05/20/2022   Tobacco use disorder 05/20/2022   GERD (gastroesophageal reflux disease) 05/20/2022   B12 deficiency 03/15/2022   History of colonic polyps 03/09/2022   Gallbladder polyp 03/01/2022   Alcohol abuse 11/28/2021   Upper abdominal pain 08/11/2021   AKI (acute kidney injury) (HCC) 08/11/2021   Portal hypertension (HCC) 08/03/2021   Other ascites 08/03/2021   Moderate protein-calorie malnutrition (HCC) 08/03/2021   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   Elevated ferritin 08/26/2020   Hypomagnesemia 08/11/2020   Hypoalbuminemia 08/11/2020   Hypotension 08/11/2020   Anemia of chronic disease 08/11/2020   DVT (deep venous thrombosis) -Lt LL Extensive DVT 08/05/2020   Alcoholic cirrhosis (HCC) 08/05/2020   COPD (chronic obstructive pulmonary disease) (HCC) 08/05/2020   Hypokalemia 08/05/2020   Cirrhosis of liver without ascites (HCC) 08/04/2020   Edema of left lower extremity 08/04/2020   Loss of weight 07/07/2020   Loss of appetite 07/07/2020   Anasarca 07/07/2020   Heme + stool 01/22/2017   Abnormal LFTs 01/22/2017   Hyperlipidemia 02/08/2015   Cigarette nicotine dependence with nicotine-induced disorder 02/08/2015   Leukocytosis 01/07/2013   Hyponatremia  01/07/2013   UTI (urinary tract infection) 01/06/2013   Kidney stone 01/06/2013   Ureteral colic 01/06/2013    Palliative Care Assessment & Plan   Patient Profile: 58 y.o. male  with past medical history of hypertension, COPD, alcoholic liver cirrhosis, hemosiderosis, CKD 3B, dyslipidemia, prior left lower extremity DVT presenting to [facility] health ED secondary to 3 to 4-day complaint of abdominal pain. He was admitted on 07/05/2023 with acute pancreatitis with septic shock and lactic acidosis, decompensated EtOH cirrhosis complicated by shock liver with associated coagulopathy and thrombocytopenia, acute encephalopathy multifactorial, and others.    He was transferred to [facility] for higher level of care.  He has since had a clinical decompensation.  See significant hospital events below.   Significant Hospital Events Hemodialysis 07/06/2023 (temporary HD line placed) s/p short HD, tx to [facility] 3/8 worsening shock, pressors, CTH/ CT a/p 3/9 intubated. On 3 pressors. Renal fxn and lactic acidosis worse. Started CRRT 3/10 LFTs a little better. Still on 3 pressors. Comfortable efforts on PSV but mental status not amendable to extubation. 3/12 - taken off CRRT and extubated 3/15 - back on pressors 3/16 - back on CRRT  Assessment: Principal Problem:   Acute pancreatitis Active Problems:   Alcoholic cirrhosis (HCC)   Shock liver   Need for acute hemodialysis (HCC)   Acute biliary pancreatitis   Coagulopathy (HCC)   Somnolence   Septic shock (HCC)   Elevated liver enzymes   Palliative care by specialist   Goals of care, counseling/discussion   Protein-calorie malnutrition, severe   Recommendations/Plan: Continue full scope treatment Continue DNR-intervene as previously documented Sister is considering patient's transition to full comfort measures; however, wants to include two other family members in GOC discussions prior to making final decisions Ongoing GOC family meeting  scheduled for Wednesday 3/19. PMT  will call sister tomorrow to confirm a time PMT will continue to follow and support holistically  Goals of Care and Additional Recommendations: Limitations on Scope of Treatment: Full Scope Treatment  Code Status:    Code Status Orders  (From admission, onward)           Start     Ordered   07/12/23 1619  Do not attempt resuscitation (DNR) Pre-Arrest Interventions Desired  (Code Status)  Continuous       Question Answer Comment  If pulseless and not breathing No CPR or chest compressions.   In Pre-Arrest Conditions (Patient Has Pulse and Is Breathing) May intubate, use advanced airway interventions and cardioversion/ACLS medications if appropriate or indicated. May transfer to ICU.   Consent: Discussion documented in EHR or advanced directives reviewed      07/12/23 1618           Code Status History     Date Active Date Inactive Code Status Order ID Comments User Context   07/06/2023 2158 07/12/2023 1618 Full Code [id number]  Marylu Lund Inpatient   07/05/2023 1312 07/06/2023 2158 Full Code [id number]  Erick Blinks, DO ED   03/12/2023 0241 03/14/2023 1553 Full Code [id number]  Lilyan Gilford, DO Inpatient   09/23/2022 0352 09/25/2022 1624 Full Code [id number]  Lilyan Gilford, DO ED   05/19/2022 2232 05/24/2022 1733 Full Code [id number]  Zierle-Ghosh, Asia B, DO ED   03/02/2022 1558 03/04/2022 1717 Full Code [id number]  Erick Blinks, DO Inpatient   02/09/2021 2224 02/10/2021 2220 Full Code [id number]  Elgergawy, Leana Roe, MD Inpatient   08/05/2020 1619 08/11/2020 2231 Full Code [id number]  Shon Hale, MD Inpatient   01/06/2013 2358 01/09/2013 1651 Full Code [id number]  Vania Rea, MD Inpatient       Prognosis:  Unable to determine  Discharge Planning: To Be Determined  Care plan was discussed with primary RN, Dr. Vassie Loll, [facility], patient's sister  Thank you for allowing the Palliative Medicine Team to assist in the care of this  patient.   Total Time 50 minutes Prolonged Time Billed  no       Haskel Khan, NP  Please contact Palliative Medicine Team phone at [phone] for questions and concerns.   *Portions of this note are a verbal dictation therefore any spelling and/or grammatical errors are due to the "[employer]" system interpretation.

## 2023-07-16 NOTE — Progress Notes (Signed)
 eLink Physician-Brief Progress Note Patient Name: [name] [name] DOB: Aug 17, 1965 MRN: [id number]   Date of Service  07/16/2023  HPI/Events of Note  58 y.o. male with past medical history of hypertension, COPD, alcoholic liver cirrhosis, hemosiderosis, CKD 3B, dyslipidemia, prior left lower extremity DVT presenting to [facility] health ED secondary to 3 to 4-day complaint of abdominal pain.    Ongoing agitation   eICU Interventions  Renew restraints for patient's safety     Intervention Category Minor Interventions: Agitation / anxiety - evaluation and management  Judd Mccubbin 07/16/2023, 12:15 AM

## 2023-07-16 NOTE — Plan of Care (Signed)
  Problem: Education: Goal: Knowledge of General Education information will improve Description: Including pain rating scale, medication(s)/side effects and non-pharmacologic comfort measures Outcome: Progressing   Problem: Health Behavior/Discharge Planning: Goal: Ability to manage health-related needs will improve Outcome: Progressing   Problem: Clinical Measurements: Goal: Ability to maintain clinical measurements within normal limits will improve Outcome: Progressing Goal: Will remain free from infection Outcome: Progressing Goal: Diagnostic test results will improve Outcome: Progressing Goal: Respiratory complications will improve Outcome: Progressing Goal: Cardiovascular complication will be avoided Outcome: Progressing   Problem: Activity: Goal: Risk for activity intolerance will decrease Outcome: Progressing   Problem: Nutrition: Goal: Adequate nutrition will be maintained Outcome: Progressing   Problem: Coping: Goal: Level of anxiety will decrease Outcome: Progressing   Problem: Elimination: Goal: Will not experience complications related to bowel motility Outcome: Progressing Goal: Will not experience complications related to urinary retention Outcome: Progressing   Problem: Pain Managment: Goal: General experience of comfort will improve and/or be controlled Outcome: Progressing   Problem: Safety: Goal: Ability to remain free from injury will improve Outcome: Progressing   Problem: Skin Integrity: Goal: Risk for impaired skin integrity will decrease Outcome: Progressing   Problem: Safety: Goal: Non-violent Restraint(s) Outcome: Progressing   Problem: Education: Goal: Ability to describe self-care measures that may prevent or decrease complications (Diabetes Survival Skills Education) will improve Outcome: Progressing Goal: Individualized Educational Video(s) Outcome: Progressing   Problem: Coping: Goal: Ability to adjust to condition or change  in health will improve Outcome: Progressing   Problem: Fluid Volume: Goal: Ability to maintain a balanced intake and output will improve Outcome: Progressing   Problem: Health Behavior/Discharge Planning: Goal: Ability to identify and utilize available resources and services will improve Outcome: Progressing Goal: Ability to manage health-related needs will improve Outcome: Progressing   Problem: Metabolic: Goal: Ability to maintain appropriate glucose levels will improve Outcome: Progressing   Problem: Nutritional: Goal: Maintenance of adequate nutrition will improve Outcome: Progressing Goal: Progress toward achieving an optimal weight will improve Outcome: Progressing   Problem: Skin Integrity: Goal: Risk for impaired skin integrity will decrease Outcome: Progressing   Problem: Tissue Perfusion: Goal: Adequacy of tissue perfusion will improve Outcome: Progressing

## 2023-07-16 NOTE — Progress Notes (Signed)
 Nutrition Follow-up  DOCUMENTATION CODES:  Severe malnutrition in context of chronic illness  INTERVENTION:  TF via Cortrak: Decrease Vital 1.5 to goal rate of 8ml/hr (1320 ml per day) Increase 60ml Prosource TF20 to QID TF at goal provides: 2300 kcal, 169g protein and 1100 ml free water  NUTRITION DIAGNOSIS:  Severe Malnutrition related to chronic illness (ETOH use, COPD, liver cirrhosis) as evidenced by severe fat depletion, severe muscle depletion. - remains applicable  GOAL:  Patient will meet greater than or equal to 90% of their needs - goal met via TF  MONITOR:   Labs, Vent status, Weight trends, I & O's  REASON FOR ASSESSMENT:   Consult  (CRRT protocol)  ASSESSMENT:   Pt admitted with 3-4 days abdominal pain secondary to acute pancreatitis. PMH significant for HTN, COPD, alcoholic liver cirrhosis, hemosiderosis, CKD 3b, dyslipidemia, prior LLE DVT  3/7: TDC placement; s/p short HD session 3/8: worsening shock 3/9: CRRT started; intubated 3/12:  extubated; CRRT discontinued; trickle TF via Cortrak  3/14: Cortrak replaced (tip in midbody of stomach) 3/15: repeat chest/abdomen/pelvis- persistent mediastinal adenopathy, enlarged edematous pancreas with surrounding inflammation compatible with persistent acute pancreatitis; small bowel decompressed 3/16: CRRT resumed  PMT following.  Continue with current supportive interventions.  Plans for another GOC discussion on Wednesday.   Checked in with pt at bedside. RN present.  No reported emesis. Pt briefly awoke to RD presence and denied abdominal pain.  Abdomen remains distended and taut. Discussed with MD. More concern for fluid/pancreatitis/liver disease versus bowel dysfunction.   RN reports having lots of clay-like stool output.  Remains on lactulose.   Pt was planned for iHD  on 3/15 however d/t escalating pressors unable to initiate. Restarted on CRRT.   GI following, recommending contrasted CT  abdomen/pelvis/chest d/t concerns for mediastinal lymphadenopathy and possible mediastinal mass/lymphoma.   Admit weight: 73.9 kg Current weight: 84 kg  Drains/lines: UOP: x24 hours CRRT: x24 hours  Medications: folvite, SSI 0-15units q4h, lactulose BID, thiamine Drips: Abx Levo @ 9mcg/min  Labs:  Sodium 134 Potassium 3.3 BUN 71 Cr 4.10 Corrected calcium 8.72 Albumin 1.6 CBG's 148-233 x24 hours  Diet Order:   Diet Order             Diet NPO time specified  Diet effective now                   EDUCATION NEEDS:   No education needs have been identified at this time  Skin:  Skin Assessment: Reviewed RN Assessment  Last BM:  x24 hours via FMS  Height:   Ht Readings from Last 1 Encounters:  07/06/23 6' (1.829 m)    Weight:   Wt Readings from Last 1 Encounters:  07/15/23 84 kg   BMI:  Body mass index is 25.12 kg/m.  Estimated Nutritional Needs:   Kcal:  2100-2300  Protein:  150-175g  Fluid:  1L + UOP  Drusilla Kanner, RDN, LDN Clinical Nutrition

## 2023-07-17 DIAGNOSIS — A419 Sepsis, unspecified organism: Secondary | ICD-10-CM | POA: Diagnosis not present

## 2023-07-17 DIAGNOSIS — N179 Acute kidney failure, unspecified: Secondary | ICD-10-CM | POA: Diagnosis not present

## 2023-07-17 DIAGNOSIS — K7031 Alcoholic cirrhosis of liver with ascites: Secondary | ICD-10-CM | POA: Diagnosis not present

## 2023-07-17 DIAGNOSIS — Z66 Do not resuscitate: Secondary | ICD-10-CM | POA: Diagnosis not present

## 2023-07-17 DIAGNOSIS — R1084 Generalized abdominal pain: Secondary | ICD-10-CM | POA: Diagnosis not present

## 2023-07-17 DIAGNOSIS — Z515 Encounter for palliative care: Secondary | ICD-10-CM | POA: Diagnosis not present

## 2023-07-17 DIAGNOSIS — R579 Shock, unspecified: Secondary | ICD-10-CM | POA: Diagnosis not present

## 2023-07-17 DIAGNOSIS — K852 Alcohol induced acute pancreatitis without necrosis or infection: Secondary | ICD-10-CM | POA: Diagnosis not present

## 2023-07-17 DIAGNOSIS — F1090 Alcohol use, unspecified, uncomplicated: Secondary | ICD-10-CM | POA: Diagnosis not present

## 2023-07-17 DIAGNOSIS — K851 Biliary acute pancreatitis without necrosis or infection: Secondary | ICD-10-CM | POA: Diagnosis not present

## 2023-07-17 LAB — CBC
HCT: 25.5 % — ABNORMAL LOW (ref 39.0–52.0)
Hemoglobin: 8.8 g/dL — ABNORMAL LOW (ref 13.0–17.0)
MCH: 29.8 pg (ref 26.0–34.0)
MCHC: 34.5 g/dL (ref 30.0–36.0)
MCV: 86.4 fL (ref 80.0–100.0)
Platelets: 147 10*3/uL — ABNORMAL LOW (ref 150–400)
RBC: 2.95 MIL/uL — ABNORMAL LOW (ref 4.22–5.81)
RDW: 18.6 % — ABNORMAL HIGH (ref 11.5–15.5)
WBC: 23.5 10*3/uL — ABNORMAL HIGH (ref 4.0–10.5)
nRBC: 0.1 % (ref 0.0–0.2)

## 2023-07-17 LAB — RENAL FUNCTION PANEL
Albumin: 1.9 g/dL — ABNORMAL LOW (ref 3.5–5.0)
Albumin: 2.2 g/dL — ABNORMAL LOW (ref 3.5–5.0)
Anion gap: 10 (ref 5–15)
Anion gap: 10 (ref 5–15)
BUN: 31 mg/dL — ABNORMAL HIGH (ref 6–20)
BUN: 29 mg/dL — ABNORMAL HIGH (ref 6–20)
CO2: 22 mmol/L (ref 22–32)
CO2: 22 mmol/L (ref 22–32)
Calcium: 7.5 mg/dL — ABNORMAL LOW (ref 8.9–10.3)
Calcium: 7.6 mg/dL — ABNORMAL LOW (ref 8.9–10.3)
Chloride: 104 mmol/L (ref 98–111)
Chloride: 102 mmol/L (ref 98–111)
Creatinine, Ser: 2.25 mg/dL — ABNORMAL HIGH (ref 0.61–1.24)
Creatinine, Ser: 2.08 mg/dL — ABNORMAL HIGH (ref 0.61–1.24)
GFR, Estimated: 33 mL/min — ABNORMAL LOW (ref >60)
GFR, Estimated: 36 mL/min — ABNORMAL LOW (ref >60)
Glucose, Bld: 191 mg/dL — ABNORMAL HIGH (ref 70–99)
Glucose, Bld: 221 mg/dL — ABNORMAL HIGH (ref 70–99)
Phosphorus: 3.2 mg/dL (ref 2.5–4.6)
Phosphorus: 3.1 mg/dL (ref 2.5–4.6)
Potassium: 3.9 mmol/L (ref 3.5–5.1)
Potassium: 4 mmol/L (ref 3.5–5.1)
Sodium: 136 mmol/L (ref 135–145)
Sodium: 134 mmol/L — ABNORMAL LOW (ref 135–145)

## 2023-07-17 LAB — PROTIME-INR
INR: 1.8 — ABNORMAL HIGH (ref 0.8–1.2)
Prothrombin Time: 20.9 s — ABNORMAL HIGH (ref 11.4–15.2)

## 2023-07-17 LAB — MAGNESIUM: Magnesium: 2.3 mg/dL (ref 1.7–2.4)

## 2023-07-17 LAB — GLUCOSE, CAPILLARY
Glucose-Capillary: 155 mg/dL — ABNORMAL HIGH (ref 70–99)
Glucose-Capillary: 156 mg/dL — ABNORMAL HIGH (ref 70–99)
Glucose-Capillary: 161 mg/dL — ABNORMAL HIGH (ref 70–99)
Glucose-Capillary: 174 mg/dL — ABNORMAL HIGH (ref 70–99)
Glucose-Capillary: 202 mg/dL — ABNORMAL HIGH (ref 70–99)
Glucose-Capillary: 203 mg/dL — ABNORMAL HIGH (ref 70–99)
Glucose-Capillary: 205 mg/dL — ABNORMAL HIGH (ref 70–99)
Glucose-Capillary: 219 mg/dL — ABNORMAL HIGH (ref 70–99)

## 2023-07-17 LAB — PHOSPHORUS: Phosphorus: 3.1 mg/dL (ref 2.5–4.6)

## 2023-07-17 LAB — HEPARIN INDUCED PLATELET AB (HIT ANTIBODY): Heparin Induced Plt Ab: 0.408 {OD_unit} — ABNORMAL HIGH (ref 0.000–0.400)

## 2023-07-17 MED ORDER — PIPERACILLIN-TAZOBACTAM 3.375 G IVPB 30 MIN: 3.3750 g | Freq: Four times a day (QID) | INTRAVENOUS | Status: DC

## 2023-07-17 MED ORDER — VASOPRESSIN 20 UNITS/100 ML INFUSION FOR SHOCK
0.0000 [IU]/min | INTRAVENOUS | Status: DC
Start: 2023-07-17 — End: 2023-07-19
  Administered 2023-07-17: 0.03 [IU]/min via INTRAVENOUS
  Filled 2023-07-17 (×2): qty 100

## 2023-07-17 MED ORDER — QUETIAPINE FUMARATE 100 MG PO TABS
100.0000 mg | ORAL_TABLET | Freq: Once | ORAL | Status: AC
  Administered 2023-07-17: 100 mg
  Filled 2023-07-17: qty 1

## 2023-07-17 MED ORDER — HEPARIN SODIUM (PORCINE) 1000 UNIT/ML IJ SOLN
INTRAMUSCULAR | Status: AC
  Administered 2023-07-17: 1000 [IU]
  Filled 2023-07-17: qty 4

## 2023-07-17 MED ORDER — HEPARIN SODIUM (PORCINE) 1000 UNIT/ML IJ SOLN
INTRAMUSCULAR | Status: AC
  Filled 2023-07-17: qty 1

## 2023-07-17 MED ORDER — ALBUMIN HUMAN 5 % IV SOLN
25.0000 g | Freq: Once | INTRAVENOUS | Status: AC
  Administered 2023-07-17: 25 g via INTRAVENOUS
  Filled 2023-07-17: qty 500

## 2023-07-17 MED ORDER — PIPERACILLIN-TAZOBACTAM 3.375 G IVPB 30 MIN
3.3750 g | Freq: Four times a day (QID) | INTRAVENOUS | Status: DC
  Administered 2023-07-17 – 2023-07-18 (×4): 3.375 g via INTRAVENOUS
  Filled 2023-07-17 (×4): qty 50

## 2023-07-17 NOTE — Progress Notes (Signed)
 Daily Progress Note  DOA: 07/05/2023 Hospital Day: 13   Chief Complaint: Decompensated alcohol related cirrhosis and acute severe pancreatitis  ASSESSMENT    58 year old critically ill gentleman transferred from [facility] with pancreatitis, worsening of chronic liver diease,  shock, respiratory failure requiring intubation and AKI requiring CRRT. Shock had improved with antibiotics and steroids but after discontinuation he had another decline in condition necessitating resumption of pressors.  He incurred worsening renal function and and failed fluid challenge -clinical picture possibly c/w hepatorenal syndrome - was started on CRRT and midodrine.  Today, he is reported to have need for increasing pressor support.   Septic vr vasodilatory shock / Severe pancreatitis ( Etoh vrs gallstone related).  In setting of severe pancreatitis he could have developed peripancreatic fluid collections which have become infected.  Lipase mildly elevated 211.  Liver tests elevated but not necessarily significant in setting of decompensated cirrhosis. Apparently MRCP wasn't able to be done?  Current renal status precludes ability to perform contrasted study for further imaging.  Decompensated Etoh related cirrhosis with ascites, encephalopathy, coagulopathy.  INR stable at 1.8 post Vit K  Gastric, small bowel and colon wall thickening on non contrast CT scan 3/6.  Probably related to ascites / hypoalbuminemia  AKI on CKD. On CRRT. Felt to be poor candidate for long term HD.   Mediastinal adenopathy on non-contrast CT scan. Concerning for possible metastasis or lymphoma   Principal Problem:   Acute pancreatitis Active Problems:   Alcoholic cirrhosis (HCC)   Shock liver   Need for acute hemodialysis (HCC)   Acute biliary pancreatitis   Coagulopathy (HCC)   Somnolence   Septic shock (HCC)   Elevated liver enzymes   Palliative care by specialist   Goals of care, counseling/discussion    Protein-calorie malnutrition, severe   PLAN   --CT scan with contrast would be helpful to evaluate for any peripancreatic fluid collection.  Contrasted imaging being deferred pending family meeting scheduled for 07/18/2023 to discuss palliative/comfort care. -- Abdomen is distended. He has ascites but at risk of developing ileus in the setting of critical illness.  Still passing stool.  Last CT 07/14/2023 showed decompressed small bowel.  If worsening distention recommend repeat imaging.  Continue tube feeds.   Subjective   -- Requiring increasing doses of Levophed per bedside RN -- Continues on CRRT -no fluid being pulled, on midodrine -- Afebrile, denies abdominal pain --Stool output 275 cc -- Family meeting to address goals of care plan for 07/18/2023   Objective    Recent Labs    07/15/23 0500 07/16/23 0334 07/17/23 0400  WBC 25.7* 26.4* 23.5*  HGB 8.9* 9.4* 8.8*  HCT 25.1* 26.0* 25.5*  PLT 74* 102* 147*   BMET Recent Labs    07/16/23 1233 07/16/23 1659 07/17/23 0358  NA 134* 133* 136  K 3.8 3.7 3.9  CL 103 101 104  CO2 22 22 22   GLUCOSE 185* 181* 191*  BUN 39* 36* 31*  CREATININE 2.66* 2.68* 2.25*  CALCIUM 7.6* 7.4* 7.5*   LFT Recent Labs    07/15/23 0500 07/15/23 1536 07/17/23 0358  PROT 6.9  --   --   ALBUMIN 2.5*  2.5*   < > 1.9*  AST 91*  --   --   ALT 109*  --   --   ALKPHOS 153*  --   --   BILITOT 17.2*  --   --    < > = values  in this interval not displayed.   PT/INR Recent Labs    07/16/23 0334 07/17/23 0358  LABPROT 22.1* 20.9*  INR 1.9* 1.8*     Imaging:  CT CHEST ABDOMEN PELVIS WO CONTRAST CLINICAL DATA:  Sepsis  EXAM: CT CHEST, ABDOMEN AND PELVIS WITHOUT CONTRAST  TECHNIQUE: Multidetector CT imaging of the chest, abdomen and pelvis was performed following the standard protocol without IV contrast.  RADIATION DOSE REDUCTION: This exam was performed according to the departmental dose-optimization program which includes  automated exposure control, adjustment of the mA and/or kV according to patient size and/or use of iterative reconstruction technique.  COMPARISON:  07/07/2023  FINDINGS: CT CHEST FINDINGS  Cardiovascular: Heart is normal size. Aorta is normal caliber. Moderate coronary artery and aortic atherosclerosis.  Mediastinum/Nodes: Mediastinal adenopathy. Right paratracheal lymph node has a short axis diameter of 3 cm. Subcarinal adenopathy has a short axis diameter of 3.2 cm. Concern for right hilar adenopathy although difficult to evaluate without intravenous contrast. No axillary adenopathy. Trachea and esophagus are unremarkable. Thyroid unremarkable.  Lungs/Pleura: Small bilateral pleural effusions. Dependent atelectasis in the lower lobes. Mild vascular congestion. Patchy ground-glass opacities anteriorly in the right upper lobe and lingula could reflect early pneumonia.  Musculoskeletal: Bilateral gynecomastia.  No acute bony abnormality.  CT ABDOMEN PELVIS FINDINGS  Hepatobiliary: High-density material noted within the gallbladder could reflect small stones or sludge. No focal hepatic abnormality.  Pancreas: Pancreas appears enlarged. Surrounding edema compatible with acute pancreatitis.  Spleen: No focal abnormality.  Normal size.  Adrenals/Urinary Tract: Adrenal glands normal. Atrophic left kidney. No stones or hydronephrosis bilaterally. Urinary bladder decompressed with Foley catheter in place.  Stomach/Bowel: Scattered colonic diverticulosis. No active diverticulitis. NG tube is in place with the tip in the duodenal bulb. Small bowel decompressed.  Vascular/Lymphatic: Aortic atherosclerosis. No evidence of aneurysm or adenopathy.  Reproductive: No visible focal abnormality.  Other: Moderate ascites in the abdomen and pelvis. No free air. No retroperitoneal hemorrhage.  Musculoskeletal: No acute bony abnormality. Anasarca throughout the subcutaneous soft  tissues.  IMPRESSION: Mediastinal adenopathy. This is concerning for possible metastasis or lymphoma.  Coronary artery disease, aortic atherosclerosis.  Small bilateral pleural effusions with dependent atelectasis in the lower lobes. Patchy ground-glass opacities in the upper lobes could reflect pneumonia.  Enlarged edematous pancreas with surrounding inflammation compatible with acute pancreatitis.  Moderate ascites.  Electronically Signed   By: Charlett Nose M.D.   On: 07/14/2023 21:33     Scheduled inpatient medications:   arformoterol  15 mcg Nebulization BID   budesonide (PULMICORT) nebulizer solution  0.5 mg Nebulization BID   Chlorhexidine Gluconate Cloth  6 each Topical Q0600   feeding supplement (PROSource TF20)  60 mL Per Tube QID   folic acid  1 mg Per Tube Daily   insulin aspart  0-15 Units Subcutaneous Q4H   lactulose  10 g Per Tube BID   midodrine  10 mg Per Tube Q8H   nicotine  14 mg Transdermal Daily   mouth rinse  15 mL Mouth Rinse 4 times per day   pantoprazole (PROTONIX) IV  40 mg Intravenous Q24H   QUEtiapine  50 mg Per Tube BID   thiamine  100 mg Per Tube Daily   Continuous inpatient infusions:   feeding supplement (VITAL 1.5 CAL) 55 mL/hr at 07/17/23 1500   norepinephrine (LEVOPHED) Adult infusion 5 mcg/min (07/17/23 1500)   piperacillin-tazobactam Stopped (07/17/23 1248)   prismasol BGK 4/2.5 400 mL/hr at 07/17/23 1323   prismasol BGK 4/2.5  400 mL/hr at 07/17/23 1325   prismasol BGK 4/2.5 1,500 mL/hr at 07/17/23 1213   vasopressin 0.03 Units/min (07/17/23 1500)   PRN inpatient medications: alteplase, haloperidol lactate, ipratropium-albuterol, [DISCONTINUED] ondansetron **OR** ondansetron (ZOFRAN) IV, mouth rinse, mouth rinse, oxyCODONE, sodium chloride  Vital signs in last 24 hours: Temp:  [98.2 F (36.8 C)-99 F (37.2 C)] 98.2 F (36.8 C) (03/18 1400) Pulse Rate:  [111-128] 112 (03/18 1500) Resp:  [15-30] 21 (03/18 1500) BP:  (75-127)/(45-89) 104/67 (03/18 1500) SpO2:  [93 %-100 %] 97 % (03/18 1500) Weight:  [83 kg] 83 kg (03/18 0343) Last BM Date : 07/16/23  Intake/Output Summary (Last 24 hours) at 07/17/2023 1529 Last data filed at 07/17/2023 1500 Gross per 24 hour  Intake 2345.77 ml  Output 1927.8 ml  Net 417.97 ml    Intake/Output from previous day: 03/17 0701 - 03/18 0700 In: 1795.7 [I.V.:54.5; NG/GT:1741.2] Out: 1803.5 [Urine:26; Stool:275] Intake/Output this shift: Total I/O In: 1221.3 [I.V.:86.3; NG/GT:665; IV Piggyback:470] Out: 760.3    Physical Exam:  General: Alert confused male in NAD Heart:  Regular rate and rhythm.  Pulmonary: Normal respiratory effort Abdomen: Soft, distended, nontender, tympanitic, quiet bowel sounds.  Psych: Confused but cooperative     LOS: 12 days   Ottie Glazier , MD 07/17/2023, 3:29 PM

## 2023-07-17 NOTE — Plan of Care (Signed)
  Problem: Education: Goal: Knowledge of General Education information will improve Description: Including pain rating scale, medication(s)/side effects and non-pharmacologic comfort measures Outcome: Progressing   Problem: Health Behavior/Discharge Planning: Goal: Ability to manage health-related needs will improve Outcome: Progressing   Problem: Clinical Measurements: Goal: Ability to maintain clinical measurements within normal limits will improve Outcome: Progressing Goal: Will remain free from infection Outcome: Progressing Goal: Diagnostic test results will improve Outcome: Progressing Goal: Respiratory complications will improve Outcome: Progressing Goal: Cardiovascular complication will be avoided Outcome: Progressing   Problem: Activity: Goal: Risk for activity intolerance will decrease Outcome: Progressing   Problem: Nutrition: Goal: Adequate nutrition will be maintained Outcome: Progressing   Problem: Coping: Goal: Level of anxiety will decrease Outcome: Progressing   Problem: Elimination: Goal: Will not experience complications related to bowel motility Outcome: Progressing Goal: Will not experience complications related to urinary retention Outcome: Progressing   Problem: Pain Managment: Goal: General experience of comfort will improve and/or be controlled Outcome: Progressing   Problem: Safety: Goal: Ability to remain free from injury will improve Outcome: Progressing   Problem: Skin Integrity: Goal: Risk for impaired skin integrity will decrease Outcome: Progressing   Problem: Safety: Goal: Non-violent Restraint(s) Outcome: Progressing   Problem: Education: Goal: Ability to describe self-care measures that may prevent or decrease complications (Diabetes Survival Skills Education) will improve Outcome: Progressing Goal: Individualized Educational Video(s) Outcome: Progressing   Problem: Coping: Goal: Ability to adjust to condition or change  in health will improve Outcome: Progressing   Problem: Fluid Volume: Goal: Ability to maintain a balanced intake and output will improve Outcome: Progressing   Problem: Health Behavior/Discharge Planning: Goal: Ability to identify and utilize available resources and services will improve Outcome: Progressing Goal: Ability to manage health-related needs will improve Outcome: Progressing   Problem: Metabolic: Goal: Ability to maintain appropriate glucose levels will improve Outcome: Progressing   Problem: Nutritional: Goal: Maintenance of adequate nutrition will improve Outcome: Progressing Goal: Progress toward achieving an optimal weight will improve Outcome: Progressing   Problem: Skin Integrity: Goal: Risk for impaired skin integrity will decrease Outcome: Progressing   Problem: Tissue Perfusion: Goal: Adequacy of tissue perfusion will improve Outcome: Progressing

## 2023-07-17 NOTE — Progress Notes (Signed)
 [facility] NEPHROLOGY PROGRESS NOTE  Assessment/ Plan: Pt is a 58 y.o. 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   # Dialysis dependent anuric AKI on CKD3b likely ischemic ATN, started HD 07/06/2023 using left femoral temp HD catheter placed by surgery at [facility]. - CRRT 3/9-3/12.  Remains extubated, more alert but confused.  Not much urine output (oliguric even when not on CRRT).  BUN and crt rising daily  off of CRRT .  Failed lasix challenge.  Noted palliative care discussion about goals of care-  no decisions made as of yet re dialysis . Hypotensive and didn't respond to fluid challenge. CRRT back on 3/16, on midodrine.  VERY poor candidate for long term HD; would not offer -  MELD gives a high level of mortality and with his confusion is not ideal-  he is looking at SNF and again would not recommend long term dialysis with associated poor quality of life.    Discussed with staff, rinse back if filter about to clot as well as next scheduled filter change.  Filter was just changed on 3/17.  Seen on CRRT, 4K bath, no change.  UF net even, SBP only in 80's (70's earlier) despite midodrine 10 mg 3 times daily.  # Septic shock: about to start low dose Levo  #Severe gallstone pancreatitis with transaminitis: Per critical care.  #Anion gap metabolic acidosis/lactic acidosis: Improving.  # Hyperkalemia resolved  Anemia-  had not been a major issue -  but now dropped 2 grams-  concern for [facility] but did not have on imaging-  supportive care    Palliative has engaged with family -  is DNR but no other limits of care have been placed  Subjective:  Urine output  oliguric range currently on CRRT. Confused but not agitated; had to give Haldol last night.  Objective Vital signs in last 24 hours: Vitals:   07/17/23 0800 07/17/23 0815 07/17/23 0817 07/17/23  0830  BP: 90/60 (!) 82/53 (!) 83/47 (!) 84/52  Pulse: (!) 115   (!) 115  Resp: 19 20 19 15   Temp:      TempSrc:      SpO2: 100%   98%  Weight:      Height:       Weight change:   Intake/Output Summary (Last 24 hours) at 07/17/2023 0837 Last data filed at 07/17/2023 0800 Gross per 24 hour  Intake 1790.68 ml  Output 1795.4 ml  Net -4.72 ml       Labs: RENAL PANEL Recent Labs  Lab 07/14/23 1013 07/14/23 1509 07/14/23 1809 07/15/23 0500 07/15/23 1536 07/15/23 1751 07/16/23 0334 07/16/23 1233 07/16/23 1659 07/17/23 0358  NA  --   --   --  134*  134* 134*  --   --  134* 133* 136  K  --   --   --  3.6  3.6 3.3*  --   --  3.8 3.7 3.9  CL  --   --   --  101  100 104  --   --  103 101 104  CO2  --   --   --  18*  18* 21*  --   --  22 22 22   GLUCOSE  --   --   --  237*  235* 165*  --   --  185* 181* 191*  BUN  --   --   --  87*  86* 71*  --   --  39* 36* 31*  CREATININE  --   --   --  5.16*  5.13* 4.10*  --   --  2.66* 2.68* 2.25*  CALCIUM  --   --   --  7.5*  7.6* 7.6*  --   --  7.6* 7.4* 7.5*  MG 2.5*  --  2.6* 2.6*  --   --  2.4  --   --  2.3  PHOS  --    < >  --  4.4  4.4 3.7 3.8 3.3 3.1 3.1 3.2  3.1  ALBUMIN  --    < >  --  2.5*  2.5* 2.6*  --   --  2.0* 2.0* 1.9*   < > = values in this interval not displayed.    Liver Function Tests: Recent Labs  Lab 07/13/23 0547 07/14/23 0230 07/14/23 1509 07/15/23 0500 07/15/23 1536 07/16/23 1233 07/16/23 1659 07/17/23 0358  AST 161*  --  120* 91*  --   --   --   --   ALT 224*  --  139* 109*  --   --   --   --   ALKPHOS 167*  --  185* 153*  --   --   --   --   BILITOT 13.3*  --  14.8* 17.2*  --   --   --   --   PROT 6.8  --  7.0 6.9  --   --   --   --   ALBUMIN 1.8*  1.8*   < > 2.1* 2.5*  2.5*   < > 2.0* 2.0* 1.9*   < > = values in this interval not displayed.   Recent Labs  Lab 07/13/23 0547 07/14/23 1509 07/16/23 1007  LIPASE 150* 302* 211*   No results for input(s): "AMMONIA" in the last 168  hours.  CBC: Recent Labs    09/22/22 0926 09/22/22 0927 09/22/22 1003 09/29/22 1052 12/02/22 2225 03/19/23 1359 03/19/23 1400 03/26/23 0954 06/25/23 1008 07/05/23 0615 07/12/23 0800 07/13/23 0547 07/15/23 0500 07/16/23 0334 07/17/23 0400  HGB 12.9*  --    < >  --    < > 10.1*  --    < >  --    < > 9.6* 10.5* 8.9* 9.4* 8.8*  MCV 90.7  --    < >  --    < > 92.2  --    < >  --    < > 86.5 84.7 85.4 85.8 86.4  VITAMINB12  --  687  --   --   --   --  1,053*  --   --   --   --   --   --   --   --   FOLATE 13.2  --   --   --   --  6.3  --   --   --   --   --   --   --   --   --   FERRITIN  --  217  --  210  --   --  561*  --  218  --   --   --   --   --   --   TIBC  --  273  --  256  --   --  175*  --  224*  --   --   --   --   --   --  IRON  --  41*  --  61  --   --  27*  --  50  --   --   --   --   --   --    < > = values in this interval not displayed.    Cardiac Enzymes: No results for input(s): "CKTOTAL", "CKMB", "CKMBINDEX", "TROPONINI" in the last 168 hours. CBG: Recent Labs  Lab 07/16/23 1508 07/16/23 1932 07/16/23 2356 07/17/23 0358 07/17/23 0740  GLUCAP 172* 203* 174* 156* 161*    Iron Studies: No results for input(s): "IRON", "TIBC", "TRANSFERRIN", "FERRITIN" in the last 72 hours. Studies/Results: No results found.   Medications: Infusions:  feeding supplement (VITAL 1.5 CAL) 55 mL/hr at 07/17/23 0800   norepinephrine (LEVOPHED) Adult infusion 2 mcg/min (07/17/23 0834)   prismasol BGK 4/2.5 400 mL/hr at 07/17/23 0035   prismasol BGK 4/2.5 400 mL/hr at 07/17/23 0034   prismasol BGK 4/2.5 1,500 mL/hr at 07/17/23 8119    Scheduled Medications:  arformoterol  15 mcg Nebulization BID   budesonide (PULMICORT) nebulizer solution  0.5 mg Nebulization BID   Chlorhexidine Gluconate Cloth  6 each Topical Q0600   feeding supplement (PROSource TF20)  60 mL Per Tube QID   folic acid  1 mg Per Tube Daily   insulin aspart  0-15 Units Subcutaneous Q4H   lactulose   10 g Per Tube BID   midodrine  10 mg Per Tube Q8H   nicotine  14 mg Transdermal Daily   mouth rinse  15 mL Mouth Rinse 4 times per day   pantoprazole (PROTONIX) IV  40 mg Intravenous Q24H   QUEtiapine  50 mg Per Tube BID   thiamine  100 mg Per Tube Daily    have reviewed scheduled and prn medications.  Physical Exam: General: eyes open but not appropriate Heart:RRR, s1s2 nl Lungs: Clear bilateral. Abdomen:soft, non-distended Extremities + leg edema  Dialysis Access: Lt femoral temp HD line- .placed on 3/7  Athanasia Stanwood W 07/17/2023,8:37 AM  LOS: 12 days

## 2023-07-17 NOTE — Progress Notes (Signed)
 Daily Progress Note   Patient Name: [name] [name]       Date: 07/17/2023 DOB: 09/25/1965  Age: 58 y.o. MRN#: [id number] Attending Physician: Oretha Milch, MD Primary Care Physician: Benetta Spar, MD Admit Date: 07/05/2023  Reason for Consultation/Follow-up: {Reason for Consult:23484}  Subjective: I have reviewed medical records including EPIC notes, MAR, any available advanced directives as necessary, and labs. Received report from primary RN - ***  All questions and concerns addressed. Encouraged to call with questions and/or concerns. PMT card provided.  Length of Stay: 12  Current Medications: Scheduled Meds:   arformoterol  15 mcg Nebulization BID   budesonide (PULMICORT) nebulizer solution  0.5 mg Nebulization BID   Chlorhexidine Gluconate Cloth  6 each Topical Q0600   feeding supplement (PROSource TF20)  60 mL Per Tube QID   folic acid  1 mg Per Tube Daily   insulin aspart  0-15 Units Subcutaneous Q4H   lactulose  10 g Per Tube BID   midodrine  10 mg Per Tube Q8H   nicotine  14 mg Transdermal Daily   mouth rinse  15 mL Mouth Rinse 4 times per day   pantoprazole (PROTONIX) IV  40 mg Intravenous Q24H   QUEtiapine  50 mg Per Tube BID   thiamine  100 mg Per Tube Daily    Continuous Infusions:  feeding supplement (VITAL 1.5 CAL) 55 mL/hr at 07/17/23 1000   norepinephrine (LEVOPHED) Adult infusion 10 mcg/min (07/17/23 1000)   prismasol BGK 4/2.5 400 mL/hr at 07/17/23 0035   prismasol BGK 4/2.5 400 mL/hr at 07/17/23 0034   prismasol BGK 4/2.5 1,500 mL/hr at 07/17/23 0851    PRN Meds: alteplase, haloperidol lactate, ipratropium-albuterol, [DISCONTINUED] ondansetron **OR** ondansetron (ZOFRAN) IV, mouth rinse, mouth rinse, oxyCODONE, sodium chloride  Physical Exam           Vital Signs: BP (!) 86/54   Pulse (!) 122   Temp 98.7 F (37.1 C) (Oral)   Resp 20   Ht 6' (1.829 m)   Wt 83 kg   SpO2 97%   BMI 24.82 kg/m  SpO2: SpO2: 97 % O2 Device: O2 Device: Room Air O2 Flow Rate: O2 Flow Rate (L/min): 2 L/min  Intake/output summary:  Intake/Output Summary (Last 24 hours) at 07/17/2023 1020 Last data filed at 07/17/2023 1000 Gross  per 24 hour  Intake 1762.47 ml  Output 1848.8 ml  Net -86.33 ml   LBM: Last BM Date : 07/16/23 Baseline Weight: Weight: 73.9 kg Most recent weight: Weight: 83 kg       Palliative Assessment/Data:      Patient Active Problem List   Diagnosis Date Noted   Protein-calorie malnutrition, severe 07/12/2023   Palliative care by specialist 07/10/2023   Goals of care, counseling/discussion 07/10/2023   Elevated liver enzymes 07/08/2023   Coagulopathy (HCC) 07/07/2023   Somnolence 07/07/2023   Septic shock (HCC) 07/07/2023   Shock liver 07/06/2023   Need for acute hemodialysis (HCC) 07/06/2023   Acute biliary pancreatitis 07/06/2023   Dog bite 03/12/2023   Acute pancreatitis 03/11/2023   Metabolic acidosis 09/25/2022   Bladder outlet obstruction 09/25/2022   Acute pancreatitis without infection or necrosis 05/24/2022   Alcohol-induced acute pancreatitis 05/23/2022   Cholelithiasis 05/20/2022   Tobacco use disorder 05/20/2022   GERD (gastroesophageal reflux disease) 05/20/2022   B12 deficiency 03/15/2022   History of colonic polyps 03/09/2022   Gallbladder polyp 03/01/2022   Alcohol abuse 11/28/2021   Upper abdominal pain 08/11/2021   AKI (acute kidney injury) (HCC) 08/11/2021   Portal hypertension (HCC) 08/03/2021   Other ascites 08/03/2021   Moderate protein-calorie malnutrition (HCC) 08/03/2021   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   Elevated ferritin 08/26/2020   Hypomagnesemia 08/11/2020   Hypoalbuminemia 08/11/2020   Hypotension 08/11/2020   Anemia of chronic disease 08/11/2020    DVT (deep venous thrombosis) -Lt LL Extensive DVT 08/05/2020   Alcoholic cirrhosis (HCC) 08/05/2020   COPD (chronic obstructive pulmonary disease) (HCC) 08/05/2020   Hypokalemia 08/05/2020   Cirrhosis of liver without ascites (HCC) 08/04/2020   Edema of left lower extremity 08/04/2020   Loss of weight 07/07/2020   Loss of appetite 07/07/2020   Anasarca 07/07/2020   Heme + stool 01/22/2017   Abnormal LFTs 01/22/2017   Hyperlipidemia 02/08/2015   Cigarette nicotine dependence with nicotine-induced disorder 02/08/2015   Leukocytosis 01/07/2013   Hyponatremia 01/07/2013   UTI (urinary tract infection) 01/06/2013   Kidney stone 01/06/2013   Ureteral colic 01/06/2013    Palliative Care Assessment & Plan   Patient Profile: ***  Assessment: Principal Problem:   Acute pancreatitis Active Problems:   Alcoholic cirrhosis (HCC)   Shock liver   Need for acute hemodialysis (HCC)   Acute biliary pancreatitis   Coagulopathy (HCC)   Somnolence   Septic shock (HCC)   Elevated liver enzymes   Palliative care by specialist   Goals of care, counseling/discussion   Protein-calorie malnutrition, severe   Recommendations/Plan: ***  Goals of Care and Additional Recommendations: Limitations on Scope of Treatment: {Recommended Scope and Preferences:21019}  Code Status:    Code Status Orders  (From admission, onward)           Start     Ordered   07/12/23 1619  Do not attempt resuscitation (DNR) Pre-Arrest Interventions Desired  (Code Status)  Continuous       Question Answer Comment  If pulseless and not breathing No CPR or chest compressions.   In Pre-Arrest Conditions (Patient Has Pulse and Is Breathing) May intubate, use advanced airway interventions and cardioversion/ACLS medications if appropriate or indicated. May transfer to ICU.   Consent: Discussion documented in EHR or advanced directives reviewed      07/12/23 1618           Code Status History     Date  Active  Date Inactive Code Status Order ID Comments User Context   07/06/2023 2158 07/12/2023 1618 Full Code [id number]  Marylu Lund Inpatient   07/05/2023 1312 07/06/2023 2158 Full Code [id number]  Erick Blinks, DO ED   03/12/2023 0241 03/14/2023 1553 Full Code [id number]  Lilyan Gilford, DO Inpatient   09/23/2022 0352 09/25/2022 1624 Full Code [id number]  Lilyan Gilford, DO ED   05/19/2022 2232 05/24/2022 1733 Full Code [id number]  Lilyan Gilford, DO ED   03/02/2022 1558 03/04/2022 1717 Full Code [id number]  Erick Blinks, DO Inpatient   02/09/2021 2224 02/10/2021 2220 Full Code [id number]  Elgergawy, Leana Roe, MD Inpatient   08/05/2020 1619 08/11/2020 2231 Full Code [id number]  Shon Hale, MD Inpatient   01/06/2013 2358 01/09/2013 1651 Full Code [id number]  Vania Rea, MD Inpatient       Prognosis:  {Palliative Care Prognosis:23504}  Discharge Planning: {Palliative dispostion:23505}  Care plan was discussed with ***  Thank you for allowing the Palliative Medicine Team to assist in the care of this patient.   Time In: *** Time Out: *** Total Time *** Prolonged Time Billed  {YES XT:06269}       Haskel Khan, NP  Please contact Palliative Medicine Team phone at [phone] for questions and concerns.   *Portions of this note are a verbal dictation therefore any spelling and/or grammatical errors are due to the "[employer]" system interpretation.

## 2023-07-17 NOTE — Progress Notes (Signed)
 CRRT filter and TMP pressures continued to climb. Per Nephro MD Paulene Floor do not restart CRRT once filter needs to be changed. CRRT stopped at 2018, blood returned to patient and per MD order HD line heparin locked.

## 2023-07-17 NOTE — Progress Notes (Signed)
 NAME:  [name] [name], MRN:  [id number], DOB:  Oct 25, 1965, LOS: 12 ADMISSION DATE:  07/05/2023, CONSULTATION DATE:  07/06/2023 REFERRING MD:  Randye Lobo, CHIEF COMPLAINT:  Abdominal Pain   History of Present Illness:  [name] [name] is a 58 year old gentleman with a known medical history significant for hypertension, COPD, alcoholic liver cirrhosis, hemosiderosis, CKD 3B, dyslipidemia, prior left lower extremity DVT presenting to [facility] health ED secondary to 3 to 4-day complaint of abdominal pain.  Patient describes bilateral flank type pain increasing in intensity without radiation to groin chest or back.  He states this is similar pain to his previous admission for pancreatitis.  Patient does elicit nausea and vomiting and diarrhea.   It is noted that the patient does have history of cholelithiasis and decompensated cirrhosis.  Patient reports he has not had any alcohol since his birthday on [birth year], however according to GI note he made comments that his last drink was on this past Sunday.  CT of the abdomen pelvis without contrast performed on July 05, 2023 reveals atrophic left kidney, gallstones and diffuse pancreatitis  Abdomen Limited ultrasound performed today 07/06/2023 reveals no significant abdominal ascites.  Laboratory indices: He does have elevation in coagulation studies with a PT of 26 on yesterday that elevated to 50.4 today 3 10/31/2023 and INR at admission yesterday 2.4 elevated to 5.5 today.  CBC: WBC 15.3, hemoglobin 11, hematocrit 36.7, platelets 164.  BMP sodium 132, potassium 5.8, chloride 99, CO2 14 BUN 40, creatinine 4.81 and glucose 214.  LFTs: Lipase 2929, AST 5021, ALT 05/08/2007.  Secondary to patient's known CKD presenting now with AKI and presence of oliguria a temporary HD line was placed by surgery team today for hemodialysis.  Patient underwent 2-hour HD in the ED prior to transfer.  Patient did receive IV fluids for pancreatitis management, as he is a poor candidate for  liver transplant due to ongoing alcohol use per GI note.  Consults: Patient has been followed by nephrology and gastrointestinal services Blood cultures sent 07/06/2023 secondary to leukocytosis.  As such patient was transferred to [facility] for ICU monitoring and management and admitted with acute on chronic liver failure/pancreatitis.  Pertinent  Medical History  Asthma, B12 deficiency, cirrhosis, dyspnea, hyperlipidemia, history of kidney stones, hypertension, kidney stones, COPD, prior lower left extremity DVT, chronic anemia.  Significant Hospital Events: Including procedures, antibiotic start and stop dates in addition to other pertinent events   3/7 Hemodialysis  (temporary HD line placed) s/p short HD, tx to [facility] 3/8 worsening shock, pressors, CTH/ CT a/p 3/9 intubated. On 3 pressors. Renal fxn and lactic acidosis worse. Started CRRT 3/10 LFTs a little better. Still on 3 pressors. Comfortable efforts on PSV but mental status not amendable to extubation. ECHO EF 55% no WMA, grade I diastolic dysfxn, started stress dose steroids. Right internal jugular CVL placed. The epi was weaned off over course of am after stress dose steroids started, NE weaning stopped fent gtt changed to PRN. D10 rate reduced to 10cc/hr. Acetylcysteine completed  3/11 vasopressin discontinued on very low-dose norepinephrine starting to wean sedation further to assess for readiness to wean ventilation but not responding and appeared to have left gaze pref after sedation off for prolonged time. CT head obtained no acute findings. Abx switched to zosyn 3/12 woke up during evening. Agitated at times. Attempting to get OOB. Still seeming to have left sided weakness but exam much closer to that of the 10th. Norepi off.  Extubated.  Taken off  CRRT, encephalopathic but tolerated extubation 3/13 agitated during the evening hours, hypertensive and tachycardic moaning.  Started on Precedex.  This was discontinued on a.m. rounds.   Remains off pressors remains off dialysis minimal urine output.  Had a goals of care discussion with palliative and critical care.  Critical care recommending DO NOT RESUSCITATE, also shared with family patient's appropriateness for hospice going forward.  Family taking CODE STATUS into advisement with plan to follow-up 3/14 Patient was agitated overnight, pulled out his core track. Off vasopressors 3/15 Back on pressors including levo and vaso, likely secondary to volume depletion given improvement with albumin but antibiotics and steroids also resumed 3/16 nephrology evaluated and decision made to start back CRRT.  Repeat CT chest abdomen pelvis with ongoing solid pancreatitis and mediastinal adenopathy  Interim History / Subjective:  Some agitation overnight  Back on pressors this morning CRRT ongoing No complaints, confused.   Objective   Blood pressure (!) 84/52, pulse (!) 115, temperature 98.7 F (37.1 C), temperature source Oral, resp. rate 15, height 6' (1.829 m), weight 83 kg, SpO2 98%.        Intake/Output Summary (Last 24 hours) at 07/17/2023 0842 Last data filed at 07/17/2023 0800 Gross per 24 hour  Intake 1790.68 ml  Output 1795.4 ml  Net -4.72 ml   Filed Weights   07/14/23 0500 07/15/23 0308 07/17/23 0343  Weight: 81.1 kg 84 kg 83 kg    Examination:  General: Middle aged male in NAD HEENT: Gerty/AT, MM pink/moist, PERRL,  Neuro: Lethargic but awakens easily and follows commands, able to tell me his address and phone number but not oriented to place or time CV: s1s2 regular rate and rhythm, no murmur, rubs, or gallops  PULM: Decreased sounds bilateral, no accessory muscle use GI: soft, distended, non-tender,tolerating TF Extremities: warm/dry, no edema  Skin: no rashes or lesions  K 3.9 BUN 31 Cr 2.25 WBC 23.5 stable Hgb 8.8 Plt 147 INR 1.8   Resolved Hospital Problem list   Lactic acid Acute hypoxic respiratory failure resolved extubated 3/12 Severe sepsis  with septic shock, POA now resolved Aspiration pneumonia -Completed 7 days of IV Zosyn 3/14  Assessment & Plan:   Septic versus vasodilatory shock -culture-negative -Initially resolved, back on pressors 3/15-3/16, now again 3/18 Some concern regarding pancreatic fluid collection but renal function has been the limiting factor for additional imaging. Completed 3 days merrem then 7 days zosyn 3/17. P: S/p 7 days zosyn and 3 days merrem prior to that.  Back on pressors this morning for MAPs in low 60s Continue midodrine Trend CBC and fever curve If he renal function includes we could re-scan his belly with contrast to better evaluate pancreatic fluid collections.  Family considering comfort care. Meeting 3/19. Appreciate PMT.   Acute on chronic pancreatitis secondary to gallstone versus alcohol use -Lipase downtrending but remains elevated -Repeat chest abdomen pelvis 3/15 with persistent mediastinal adenopathy and enlarged edematous pancreas with surrounding inflammation compatible with persistent acute pancreatitis History of pancreatic divisum on MRCP Acute on chronic liver failure Alcoholic cirrhosis -MELD 35 Coagulopathy of liver disease P: Question need for MRCP -doubt gallstone pancreatitis so do not think this would be helpful Appreciate GI Continue lactulose Monitor for ileus.   AKI on CKD stage IIIb, was on CRRT which was stopped.  Due for IHD today -CRRT utilized 3/9 to 3/12 -CRRT resumed 3/16 P: Nephrology following, appreciate assistance CRRT to hold/stop at next filter change Very poor candidate for long term HD considering  liver failure and hemodynamic instability.  Monitor urine output  Mediastinal lymphadenopathy -Not a candidate at this time for invasive biopsy  Thrombocytopenia of liver disease and critical illness anemia of chronic illness, improving P: Trend CBC  Transfuse per protocol Hemoglobin goal greater than 7  Acute encephalopathy, combined  metabolic and hepatic, improving P: Continue lactulose Delirium precautions Continue Seroquel  Recurrent hypoglycemia now with progressively hyperglycemia P: Advance SSI to moderate scale CBG goal 140-180 CBG checks every 4  Severe protein calorie malnutrition P: Continue tube feeds  Goals of care DNR - palliative to meet with family tomorrow for GOC possible comfort.   Best Practice (right click and "Reselect all SmartList Selections" daily)   Diet: Cotrak ,tube feeds DVT prophylaxis prophylactic heparin  Pressure ulcer(s): N/A GI prophylaxis: PPI Lines: central line.  Femoral HD catheter  Foley: Discontinue Code Status: DNR Last date of multidisciplinary goals of care discussion [palliative care is following]   My independent critical care time was 38 minutes    Joneen Roach, AGACNP-BC [facility]  See Amion for personal pager PCCM on call pager [phone] until 7pm. Please call Elink 7p-7a. [phone]  07/17/2023 9:05 AM

## 2023-07-17 NOTE — Progress Notes (Addendum)
 eLink Physician-Brief Progress Note Patient Name: [name] [name] DOB: [birth year] MRN: [id number]   Date of Service  07/17/2023  HPI/Events of Note  Patient with agitated delirium, has mitts in place, on continuous renal replacement therapy with NG is in place.  QTc 0.42.  Haldol already ordered.  eICU Interventions  Utilize Haldol as previously ordered.   0203 -still fairly restless, not exactly agitated but definitely delirious.  Will try one-time Seroquel in addition to the previous Seroquel for sleep-wake management.  Intervention Category Minor Interventions: Agitation / anxiety - evaluation and management  Rodert Hinch 07/17/2023, 12:06 AM

## 2023-07-18 DIAGNOSIS — R1084 Generalized abdominal pain: Secondary | ICD-10-CM | POA: Diagnosis not present

## 2023-07-18 DIAGNOSIS — K851 Biliary acute pancreatitis without necrosis or infection: Secondary | ICD-10-CM | POA: Diagnosis not present

## 2023-07-18 DIAGNOSIS — D696 Thrombocytopenia, unspecified: Secondary | ICD-10-CM

## 2023-07-18 DIAGNOSIS — Z515 Encounter for palliative care: Secondary | ICD-10-CM | POA: Diagnosis not present

## 2023-07-18 DIAGNOSIS — K7200 Acute and subacute hepatic failure without coma: Secondary | ICD-10-CM | POA: Diagnosis not present

## 2023-07-18 DIAGNOSIS — A419 Sepsis, unspecified organism: Secondary | ICD-10-CM | POA: Diagnosis not present

## 2023-07-18 DIAGNOSIS — F1090 Alcohol use, unspecified, uncomplicated: Secondary | ICD-10-CM | POA: Diagnosis not present

## 2023-07-18 DIAGNOSIS — K7031 Alcoholic cirrhosis of liver with ascites: Secondary | ICD-10-CM | POA: Diagnosis not present

## 2023-07-18 DIAGNOSIS — Z66 Do not resuscitate: Secondary | ICD-10-CM | POA: Diagnosis not present

## 2023-07-18 DIAGNOSIS — K852 Alcohol induced acute pancreatitis without necrosis or infection: Secondary | ICD-10-CM | POA: Diagnosis not present

## 2023-07-18 DIAGNOSIS — R579 Shock, unspecified: Secondary | ICD-10-CM | POA: Diagnosis not present

## 2023-07-18 DIAGNOSIS — N179 Acute kidney failure, unspecified: Secondary | ICD-10-CM | POA: Diagnosis not present

## 2023-07-18 LAB — RENAL FUNCTION PANEL
Albumin: 2 g/dL — ABNORMAL LOW (ref 3.5–5.0)
Albumin: 1.8 g/dL — ABNORMAL LOW (ref 3.5–5.0)
Anion gap: 12 (ref 5–15)
Anion gap: 14 (ref 5–15)
BUN: 42 mg/dL — ABNORMAL HIGH (ref 6–20)
BUN: 60 mg/dL — ABNORMAL HIGH (ref 6–20)
CO2: 21 mmol/L — ABNORMAL LOW (ref 22–32)
CO2: 18 mmol/L — ABNORMAL LOW (ref 22–32)
Calcium: 7.6 mg/dL — ABNORMAL LOW (ref 8.9–10.3)
Calcium: 7.5 mg/dL — ABNORMAL LOW (ref 8.9–10.3)
Chloride: 102 mmol/L (ref 98–111)
Chloride: 104 mmol/L (ref 98–111)
Creatinine, Ser: 2.77 mg/dL — ABNORMAL HIGH (ref 0.61–1.24)
Creatinine, Ser: 3.54 mg/dL — ABNORMAL HIGH (ref 0.61–1.24)
GFR, Estimated: 26 mL/min — ABNORMAL LOW (ref >60)
GFR, Estimated: 19 mL/min — ABNORMAL LOW (ref >60)
Glucose, Bld: 295 mg/dL — ABNORMAL HIGH (ref 70–99)
Glucose, Bld: 294 mg/dL — ABNORMAL HIGH (ref 70–99)
Phosphorus: 3.3 mg/dL (ref 2.5–4.6)
Phosphorus: 3.2 mg/dL (ref 2.5–4.6)
Potassium: 4.2 mmol/L (ref 3.5–5.1)
Potassium: 4.1 mmol/L (ref 3.5–5.1)
Sodium: 135 mmol/L (ref 135–145)
Sodium: 136 mmol/L (ref 135–145)

## 2023-07-18 LAB — BASIC METABOLIC PANEL
Anion gap: 7 (ref 5–15)
BUN: 44 mg/dL — ABNORMAL HIGH (ref 6–20)
CO2: 23 mmol/L (ref 22–32)
Calcium: 7.4 mg/dL — ABNORMAL LOW (ref 8.9–10.3)
Chloride: 104 mmol/L (ref 98–111)
Creatinine, Ser: 2.84 mg/dL — ABNORMAL HIGH (ref 0.61–1.24)
GFR, Estimated: 25 mL/min — ABNORMAL LOW (ref >60)
Glucose, Bld: 283 mg/dL — ABNORMAL HIGH (ref 70–99)
Potassium: 4.2 mmol/L (ref 3.5–5.1)
Sodium: 134 mmol/L — ABNORMAL LOW (ref 135–145)

## 2023-07-18 LAB — GLUCOSE, CAPILLARY
Glucose-Capillary: 115 mg/dL — ABNORMAL HIGH (ref 70–99)
Glucose-Capillary: 223 mg/dL — ABNORMAL HIGH (ref 70–99)
Glucose-Capillary: 261 mg/dL — ABNORMAL HIGH (ref 70–99)
Glucose-Capillary: 269 mg/dL — ABNORMAL HIGH (ref 70–99)
Glucose-Capillary: 270 mg/dL — ABNORMAL HIGH (ref 70–99)
Glucose-Capillary: 310 mg/dL — ABNORMAL HIGH (ref 70–99)

## 2023-07-18 LAB — CBC
HCT: 25.5 % — ABNORMAL LOW (ref 39.0–52.0)
Hemoglobin: 8.6 g/dL — ABNORMAL LOW (ref 13.0–17.0)
MCH: 30.1 pg (ref 26.0–34.0)
MCHC: 33.7 g/dL (ref 30.0–36.0)
MCV: 89.2 fL (ref 80.0–100.0)
Platelets: 201 10*3/uL (ref 150–400)
RBC: 2.86 MIL/uL — ABNORMAL LOW (ref 4.22–5.81)
RDW: 19.4 % — ABNORMAL HIGH (ref 11.5–15.5)
WBC: 22.5 10*3/uL — ABNORMAL HIGH (ref 4.0–10.5)
nRBC: 0 % (ref 0.0–0.2)

## 2023-07-18 LAB — PHOSPHORUS: Phosphorus: 3.2 mg/dL (ref 2.5–4.6)

## 2023-07-18 LAB — MAGNESIUM: Magnesium: 2.3 mg/dL (ref 1.7–2.4)

## 2023-07-18 LAB — PROTIME-INR
INR: 1.7 — ABNORMAL HIGH (ref 0.8–1.2)
Prothrombin Time: 20.4 s — ABNORMAL HIGH (ref 11.4–15.2)

## 2023-07-18 MED ORDER — PIPERACILLIN-TAZOBACTAM IN DEX 2-0.25 GM/50ML IV SOLN
2.2500 g | Freq: Three times a day (TID) | INTRAVENOUS | Status: AC
  Administered 2023-07-18 – 2023-07-20 (×8): 2.25 g via INTRAVENOUS
  Filled 2023-07-18 (×9): qty 50

## 2023-07-18 MED ORDER — CHLORHEXIDINE GLUCONATE CLOTH 2 % EX PADS
6.0000 | MEDICATED_PAD | Freq: Every day | CUTANEOUS | Status: DC
  Administered 2023-07-19 – 2023-07-22 (×4): 6 via TOPICAL

## 2023-07-18 NOTE — Progress Notes (Signed)
 Daily Progress Note  DOA: 07/05/2023 Hospital Day: 14   Chief Complaint: Decompensated alcohol related cirrhosis and acute severe pancreatitis  ASSESSMENT    58 year old critically ill gentleman transferred from [facility] with pancreatitis, worsening of chronic liver diease,  shock, respiratory failure requiring intubation and AKI requiring CRRT. Shock had improved with antibiotics and steroids but after discontinuation he had another decline in condition necessitating resumption of pressors.  He incurred worsening renal function and and failed fluid challenge -clinical picture possibly c/w hepatorenal syndrome - was started on CRRT and midodrine which was subsequently discontinued 07/18/2023.  Now off of Levophed and maintaining blood pressure.  Septic vr vasodilatory shock / Severe pancreatitis ( Etoh vrs gallstone related).  In setting of severe pancreatitis he could have developed peripancreatic fluid collections which have become infected.  Lipase mildly elevated 211.  Liver tests elevated but not necessarily significant in setting of decompensated cirrhosis. Apparently MRCP wasn't able to be done?  Current renal status precludes ability to perform contrasted study for further imaging.  Decompensated Etoh related cirrhosis with ascites, encephalopathy, coagulopathy.  INR stable at 1.7 post Vit K  Gastric, small bowel and colon wall thickening on non contrast CT scan 3/6.  Probably related to ascites / hypoalbuminemia  AKI on CKD. On CRRT. Felt to be poor candidate for long term HD.   Mediastinal adenopathy on non-contrast CT scan. Concerning for possible metastasis or lymphoma   Principal Problem:   Acute pancreatitis Active Problems:   Alcoholic cirrhosis (HCC)   Shock liver   Need for acute hemodialysis (HCC)   Acute biliary pancreatitis   Coagulopathy (HCC)   Somnolence   Septic shock (HCC)   Elevated liver enzymes   Palliative care by specialist   Goals of care,  counseling/discussion   Protein-calorie malnutrition, severe   PLAN   --CT scan with contrast would be helpful to evaluate for any peripancreatic fluid collection.  Contrasted imaging being deferred pending family meeting scheduled for 07/18/2023 to discuss palliative/comfort care. -- Abdomen is distended. He has ascites but at risk of developing ileus in the setting of critical illness.  Still passing stool.  Last CT 07/14/2023 showed decompressed small bowel.  If worsening distention recommend repeat imaging.  Continue tube feeds.   Subjective   -- Off Levophed -- CRRT discontinued last night -no urine output -- Tmax 99.2, has mild abdominal discomfort -- Stool output 525 cc -- Family meeting to address goals of care plan today with outcome pending   Objective    Recent Labs    07/16/23 0334 07/17/23 0400 07/18/23 0415  WBC 26.4* 23.5* 22.5*  HGB 9.4* 8.8* 8.6*  HCT 26.0* 25.5* 25.5*  PLT 102* 147* 201   BMET Recent Labs    07/17/23 0358 07/17/23 1614 07/18/23 0415  NA 136 134* 134*  135  K 3.9 4.0 4.2  4.2  CL 104 102 104  102  CO2 22 22 23   21*  GLUCOSE 191* 221* 283*  295*  BUN 31* 29* 44*  42*  CREATININE 2.25* 2.08* 2.84*  2.77*  CALCIUM 7.5* 7.6* 7.4*  7.6*   LFT Recent Labs    07/18/23 0415  ALBUMIN 2.0*   PT/INR Recent Labs    07/17/23 0358 07/18/23 0415  LABPROT 20.9* 20.4*  INR 1.8* 1.7*     Imaging:  CT CHEST ABDOMEN PELVIS WO CONTRAST CLINICAL DATA:  Sepsis  EXAM: CT CHEST, ABDOMEN AND PELVIS WITHOUT CONTRAST  TECHNIQUE: Multidetector  CT imaging of the chest, abdomen and pelvis was performed following the standard protocol without IV contrast.  RADIATION DOSE REDUCTION: This exam was performed according to the departmental dose-optimization program which includes automated exposure control, adjustment of the mA and/or kV according to patient size and/or use of iterative reconstruction technique.  COMPARISON:   07/07/2023  FINDINGS: CT CHEST FINDINGS  Cardiovascular: Heart is normal size. Aorta is normal caliber. Moderate coronary artery and aortic atherosclerosis.  Mediastinum/Nodes: Mediastinal adenopathy. Right paratracheal lymph node has a short axis diameter of 3 cm. Subcarinal adenopathy has a short axis diameter of 3.2 cm. Concern for right hilar adenopathy although difficult to evaluate without intravenous contrast. No axillary adenopathy. Trachea and esophagus are unremarkable. Thyroid unremarkable.  Lungs/Pleura: Small bilateral pleural effusions. Dependent atelectasis in the lower lobes. Mild vascular congestion. Patchy ground-glass opacities anteriorly in the right upper lobe and lingula could reflect early pneumonia.  Musculoskeletal: Bilateral gynecomastia.  No acute bony abnormality.  CT ABDOMEN PELVIS FINDINGS  Hepatobiliary: High-density material noted within the gallbladder could reflect small stones or sludge. No focal hepatic abnormality.  Pancreas: Pancreas appears enlarged. Surrounding edema compatible with acute pancreatitis.  Spleen: No focal abnormality.  Normal size.  Adrenals/Urinary Tract: Adrenal glands normal. Atrophic left kidney. No stones or hydronephrosis bilaterally. Urinary bladder decompressed with Foley catheter in place.  Stomach/Bowel: Scattered colonic diverticulosis. No active diverticulitis. NG tube is in place with the tip in the duodenal bulb. Small bowel decompressed.  Vascular/Lymphatic: Aortic atherosclerosis. No evidence of aneurysm or adenopathy.  Reproductive: No visible focal abnormality.  Other: Moderate ascites in the abdomen and pelvis. No free air. No retroperitoneal hemorrhage.  Musculoskeletal: No acute bony abnormality. Anasarca throughout the subcutaneous soft tissues.  IMPRESSION: Mediastinal adenopathy. This is concerning for possible metastasis or lymphoma.  Coronary artery disease, aortic  atherosclerosis.  Small bilateral pleural effusions with dependent atelectasis in the lower lobes. Patchy ground-glass opacities in the upper lobes could reflect pneumonia.  Enlarged edematous pancreas with surrounding inflammation compatible with acute pancreatitis.  Moderate ascites.  Electronically Signed   By: Charlett Nose M.D.   On: 07/14/2023 21:33     Scheduled inpatient medications:   arformoterol  15 mcg Nebulization BID   budesonide (PULMICORT) nebulizer solution  0.5 mg Nebulization BID   Chlorhexidine Gluconate Cloth  6 each Topical Q0600   feeding supplement (PROSource TF20)  60 mL Per Tube QID   folic acid  1 mg Per Tube Daily   insulin aspart  0-15 Units Subcutaneous Q4H   lactulose  10 g Per Tube BID   midodrine  10 mg Per Tube Q8H   nicotine  14 mg Transdermal Daily   mouth rinse  15 mL Mouth Rinse 4 times per day   pantoprazole (PROTONIX) IV  40 mg Intravenous Q24H   QUEtiapine  50 mg Per Tube BID   thiamine  100 mg Per Tube Daily   Continuous inpatient infusions:   feeding supplement (VITAL 1.5 CAL) 55 mL/hr at 07/18/23 1500   norepinephrine (LEVOPHED) Adult infusion Stopped (07/17/23 2102)   piperacillin-tazobactam (ZOSYN)  IV Stopped (07/18/23 1132)   prismasol BGK 4/2.5 400 mL/hr at 07/17/23 1323   prismasol BGK 4/2.5 400 mL/hr at 07/17/23 1325   prismasol BGK 4/2.5 1,500 mL/hr at 07/17/23 1846   vasopressin Stopped (07/18/23 0817)   PRN inpatient medications: alteplase, haloperidol lactate, ipratropium-albuterol, [DISCONTINUED] ondansetron **OR** ondansetron (ZOFRAN) IV, mouth rinse, mouth rinse, oxyCODONE, sodium chloride  Vital signs in last 24 hours: Temp:  [  97.7 F (36.5 C)-100.6 F (38.1 C)] 99.2 F (37.3 C) (03/19 1157) Pulse Rate:  [104-127] 121 (03/19 1500) Resp:  [17-28] 20 (03/19 1500) BP: (80-134)/(45-77) 103/52 (03/19 1500) SpO2:  [95 %-100 %] 98 % (03/19 1500) Last BM Date : 07/18/23  Intake/Output Summary (Last 24 hours) at  07/18/2023 1529 Last data filed at 07/18/2023 1500 Gross per 24 hour  Intake 1976.01 ml  Output 1087.6 ml  Net 888.41 ml    Intake/Output from previous day: 03/18 0701 - 03/19 0700 In: 2463.4 [I.V.:198.5; ZO/XW:9604; IV Piggyback:620] Out: 1832.9 [Urine:29; Stool:525] Intake/Output this shift: Total I/O In: 733.9 [I.V.:3.9; NG/GT:680; IV Piggyback:50] Out: 15 [Urine:15]   Physical Exam:  General: Alert confused male in NAD Heart:  Regular rate and rhythm.  Pulmonary: Normal respiratory effort Abdomen: Soft, distended, nontender, tympanitic, quiet bowel sounds.  Psych: Confused but cooperative     LOS: 13 days   Ottie Glazier , MD 07/18/2023, 3:29 PM

## 2023-07-18 NOTE — Progress Notes (Signed)
 NAME:  [name] [name], MRN:  [id number], DOB:  [birth year], LOS: 13 ADMISSION DATE:  07/05/2023, CONSULTATION DATE:  07/06/2023 REFERRING MD:  Randye Lobo, CHIEF COMPLAINT:  Abdominal Pain   History of Present Illness:  [name] [name] is a 58 year old gentleman with a known medical history significant for hypertension, COPD, alcoholic liver cirrhosis, hemosiderosis, CKD 3B, dyslipidemia, prior left lower extremity DVT presenting to [facility] health ED secondary to 3 to 4-day complaint of abdominal pain.  Patient describes bilateral flank type pain increasing in intensity without radiation to groin chest or back.  He states this is similar pain to his previous admission for pancreatitis.  Patient does elicit nausea and vomiting and diarrhea.   It is noted that the patient does have history of cholelithiasis and decompensated cirrhosis.  Patient reports he has not had any alcohol since his birthday on [birth year], however according to GI note he made comments that his last drink was on this past Sunday.  CT of the abdomen pelvis without contrast performed on July 05, 2023 reveals atrophic left kidney, gallstones and diffuse pancreatitis  Abdomen Limited ultrasound performed today 07/06/2023 reveals no significant abdominal ascites.  Laboratory indices: He does have elevation in coagulation studies with a PT of 26 on yesterday that elevated to 50.4 today 3 10/31/2023 and INR at admission yesterday 2.4 elevated to 5.5 today.  CBC: WBC 15.3, hemoglobin 11, hematocrit 36.7, platelets 164.  BMP sodium 132, potassium 5.8, chloride 99, CO2 14 BUN 40, creatinine 4.81 and glucose 214.  LFTs: Lipase 2929, AST 5021, ALT 05/08/2007.  Secondary to patient's known CKD presenting now with AKI and presence of oliguria a temporary HD line was placed by surgery team today for hemodialysis.  Patient underwent 2-hour HD in the ED prior to transfer.  Patient did receive IV fluids for pancreatitis management, as he is a poor candidate for  liver transplant due to ongoing alcohol use per GI note.  Consults: Patient has been followed by nephrology and gastrointestinal services Blood cultures sent 07/06/2023 secondary to leukocytosis.  As such patient was transferred to [facility] for ICU monitoring and management and admitted with acute on chronic liver failure/pancreatitis.  Pertinent  Medical History  Asthma, B12 deficiency, cirrhosis, dyspnea, hyperlipidemia, history of kidney stones, hypertension, kidney stones, COPD, prior lower left extremity DVT, chronic anemia.  Significant Hospital Events: Including procedures, antibiotic start and stop dates in addition to other pertinent events   3/7 Hemodialysis  (temporary HD line placed) s/p short HD, tx to [facility] 3/8 worsening shock, pressors, CTH/ CT a/p 3/9 intubated. On 3 pressors. Renal fxn and lactic acidosis worse. Started CRRT 3/10 LFTs a little better. Still on 3 pressors. Comfortable efforts on PSV but mental status not amendable to extubation. ECHO EF 55% no WMA, grade I diastolic dysfxn, started stress dose steroids. Right internal jugular CVL placed. The epi was weaned off over course of am after stress dose steroids started, NE weaning stopped fent gtt changed to PRN. D10 rate reduced to 10cc/hr. Acetylcysteine completed  3/11 vasopressin discontinued on very low-dose norepinephrine starting to wean sedation further to assess for readiness to wean ventilation but not responding and appeared to have left gaze pref after sedation off for prolonged time. CT head obtained no acute findings. Abx switched to zosyn 3/12 woke up during evening. Agitated at times. Attempting to get OOB. Still seeming to have left sided weakness but exam much closer to that of the 10th. Norepi off.  Extubated.  Taken off  CRRT, encephalopathic but tolerated extubation 3/13 agitated during the evening hours, hypertensive and tachycardic moaning.  Started on Precedex.  This was discontinued on a.m. rounds.   Remains off pressors remains off dialysis minimal urine output.  Had a goals of care discussion with palliative and critical care.  Critical care recommending DO NOT RESUSCITATE, also shared with family patient's appropriateness for hospice going forward.  Family taking CODE STATUS into advisement with plan to follow-up 3/14 Patient was agitated overnight, pulled out his core track. Off vasopressors 3/15 Back on pressors including levo and vaso, likely secondary to volume depletion given improvement with albumin but antibiotics and steroids also resumed 3/16 nephrology evaluated and decision made to start back CRRT.  Repeat CT chest abdomen pelvis with ongoing solid pancreatitis and mediastinal adenopathy 3/17 Some agitation overnight, back on pressors this morning 3/19 remains off CRRT, pending goals of care discussion  Interim History / Subjective:  No acute issues overnight remains minimally responsive  Objective   Blood pressure 126/73, pulse (!) 116, temperature 99.6 F (37.6 C), temperature source Axillary, resp. rate 20, height 6' (1.829 m), weight 83 kg, SpO2 96%.        Intake/Output Summary (Last 24 hours) at 07/18/2023 0719 Last data filed at 07/18/2023 0600 Gross per 24 hour  Intake 2398.5 ml  Output 1753.9 ml  Net 644.6 ml   Filed Weights   07/14/23 0500 07/15/23 0308 07/17/23 0343  Weight: 81.1 kg 84 kg 83 kg    Examination:  General: Acute on chronic ill-appearing deconditioned middle-aged male lying in bed in no acute distress HEENT: ETT, MM pink/moist, PERRL,  Neuro: Responsive to verbal stimuli, to follow commands CV: s1s2 regular rate and rhythm, no murmur, rubs, or gallops,  PULM: Diminished air entry bilaterally, no increased work of breathing, no added breath sounds, on room air GI: soft, bowel sounds active in all 4 quadrants, non-tender, non-distended Extremities: warm/dry, no edema  Skin: no rashes or lesions  Resolved Hospital Problem list   Lactic  acid Acute hypoxic respiratory failure resolved extubated 3/12 Severe sepsis with septic shock, POA now resolved Aspiration pneumonia -Completed 7 days of IV Zosyn 3/14  Assessment & Plan:   Septic versus vasodilatory shock -culture-negative -Initially resolved, back on pressors 3/15-3/16, now again 3/18 Some concern regarding pancreatic fluid collection but renal function has been the limiting factor for additional imaging. Completed 3 days merrem then 7 days zosyn 3/17. P: Pressors for MAP goal greater than 65 Continue midodrine Trend CBC and fever curve Would benefit from contrasted abdominal scan to better evaluate pancreas but renal function limits and a contrasted study Pending goals of care discussion  Acute on chronic pancreatitis secondary to gallstone versus alcohol use -Lipase downtrending but remains elevated -Repeat chest abdomen pelvis 3/15 with persistent mediastinal adenopathy and enlarged edematous pancreas with surrounding inflammation compatible with persistent acute pancreatitis History of pancreatic divisum on MRCP Acute on chronic liver failure Alcoholic cirrhosis -MELD 35 Coagulopathy of liver disease P: Appreciate GIs assistance Continue lactulose Can consider MRCP but will defer until goals of care discussion completed Monitor for signs of an ileus  Dialysis dependent anuric AKI superimposed on CKD stage IIIb likely secondary to ischemic ATN -CRRT utilized 3/9 to 3/12 -CRRT resumed 3/16 P: Nephrology following, appreciate assistance CRRT currently on hold, patient remains a very poor candidate for ongoing dialysis Supportive care  Mediastinal lymphadenopathy -Not a candidate at this time for invasive biopsy  Thrombocytopenia of liver disease and critical illness anemia of  chronic illness, improving P: Trend CBC Transfuse per protocol Hemoglobin goal greater than 7  Acute encephalopathy, combined metabolic and hepatic, improving P: Continue  lactulose Delirium precautions Continue Seroquel  Recurrent hypoglycemia now with progressively hyperglycemia P: Continue moderate scale SSI CBG goal 140-180 CBG checks every 4  Severe protein calorie malnutrition P: Continue tube feeds  Goals of care DNR - palliative to meet with family 3/19 for GOC possible comfort.   Best Practice (right click and "Reselect all SmartList Selections" daily)   Diet: Cotrak ,tube feeds DVT prophylaxis prophylactic heparin  Pressure ulcer(s): N/A GI prophylaxis: PPI Lines: central line.  Femoral HD catheter  Foley: Discontinue Code Status: DNR Last date of multidisciplinary goals of care discussion [palliative care is following]  CRITICAL CARE Performed by: Iasiah Ozment D. Harris   Total critical care time: 38 minutes  Critical care time was exclusive of separately billable procedures and treating other patients.  Critical care was necessary to treat or prevent imminent or life-threatening deterioration.  Critical care was time spent personally by me on the following activities: development of treatment plan with patient and/or surrogate as well as nursing, discussions with consultants, evaluation of patient's response to treatment, examination of patient, obtaining history from patient or surrogate, ordering and performing treatments and interventions, ordering and review of laboratory studies, ordering and review of radiographic studies, pulse oximetry and re-evaluation of patient's condition.  Dierdre Mccalip D. Harris, NP-C [facility] Personal contact information can be found on Amion  If no contact or response made please call [phone] 07/18/2023, 7:20 AM

## 2023-07-18 NOTE — Progress Notes (Signed)
 [facility] NEPHROLOGY PROGRESS NOTE  Assessment/ Plan: Pt is a 58 y.o. 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   # Dialysis dependent anuric AKI on CKD3b likely ischemic ATN, started HD 07/06/2023 using left femoral temp HD catheter placed by surgery at [facility]. - CRRT 3/9-3/12.  Remains extubated, more alert but confused.  Not much urine output (oliguric even when not on CRRT).  BUN and crt rising daily  off of CRRT .  Failed lasix challenge.  Noted palliative care discussion about goals of care-  no decisions made as of yet re dialysis . Hypotensive and didn't respond to fluid challenge. CRRT 3/16-3/18, on midodrine (CRRT orders were 4K bath, UF net even when SBP was only in 80's; already on midodrine 10 mg 3 times daily)  VERY poor candidate for long term HD; would not offer -  MELD gives a high level of mortality and with his confusion is not ideal-  he is looking at SNF and again would not recommend long term dialysis with associated poor quality of life.    Appreciate palliative meetings with family members; another meeting planned for today  # Septic shock: low dose Levo now off on 3/18; on vasopressin  #Severe gallstone pancreatitis with transaminitis: Per critical care.  #Anion gap metabolic acidosis/lactic acidosis: Improving.  # Hyperkalemia resolved  Anemia-  had not been a major issue -  but now dropped 2 grams-  concern for [facility] but did not have on imaging-  supportive care    Palliative has engaged with family -  is DNR but no other limits of care have been placed  Subjective:  Urine output  oliguric range currently off CRRT on 3/18. Confused but not agitated.  Objective Vital signs in last 24 hours: Vitals:   07/18/23 0600 07/18/23 0615 07/18/23 0630 07/18/23 0645  BP: (!) 109/58 107/60 133/74 126/73  Pulse: (!) 119 (!) 116  (!) 119 (!) 116  Resp: (!) 22 20 (!) 21 20  Temp:      TempSrc:      SpO2: 96% 96% 97% 96%  Weight:      Height:       Weight change:   Intake/Output Summary (Last 24 hours) at 07/18/2023 0730 Last data filed at 07/18/2023 0600 Gross per 24 hour  Intake 2398.5 ml  Output 1753.9 ml  Net 644.6 ml       Labs: RENAL PANEL Recent Labs  Lab 07/14/23 1809 07/15/23 0500 07/15/23 1536 07/16/23 0334 07/16/23 1233 07/16/23 1659 07/17/23 0358 07/17/23 1614 07/18/23 0415  NA  --  134*  134*   < >  --  134* 133* 136 134* 134*  135  K  --  3.6  3.6   < >  --  3.8 3.7 3.9 4.0 4.2  4.2  CL  --  101  100   < >  --  103 101 104 102 104  102  CO2  --  18*  18*   < >  --  22 22 22 22 23   21*  GLUCOSE  --  237*  235*   < >  --  185* 181* 191* 221* 283*  295*  BUN  --  87*  86*   < >  --  39* 36* 31* 29* 44*  42*  CREATININE  --  5.16*  5.13*   < >  --  2.66* 2.68* 2.25* 2.08* 2.84*  2.77*  CALCIUM  --  7.5*  7.6*   < >  --  7.6* 7.4* 7.5* 7.6* 7.4*  7.6*  MG 2.6* 2.6*  --  2.4  --   --  2.3  --  2.3  PHOS  --  4.4  4.4   < > 3.3 3.1 3.1 3.2  3.1 3.1 3.2  3.3  ALBUMIN  --  2.5*  2.5*   < >  --  2.0* 2.0* 1.9* 2.2* 2.0*   < > = values in this interval not displayed.    Liver Function Tests: Recent Labs  Lab 07/13/23 0547 07/14/23 0230 07/14/23 1509 07/15/23 0500 07/15/23 1536 07/17/23 0358 07/17/23 1614 07/18/23 0415  AST 161*  --  120* 91*  --   --   --   --   ALT 224*  --  139* 109*  --   --   --   --   ALKPHOS 167*  --  185* 153*  --   --   --   --   BILITOT 13.3*  --  14.8* 17.2*  --   --   --   --   PROT 6.8  --  7.0 6.9  --   --   --   --   ALBUMIN 1.8*  1.8*   < > 2.1* 2.5*  2.5*   < > 1.9* 2.2* 2.0*   < > = values in this interval not displayed.   Recent Labs  Lab 07/13/23 0547 07/14/23 1509 07/16/23 1007  LIPASE 150* 302* 211*   No results for input(s): "AMMONIA" in the last 168 hours.  CBC: Recent Labs    09/22/22 0926  09/22/22 0927 09/22/22 1003 09/29/22 1052 12/02/22 2225 03/19/23 1359 03/19/23 1400 03/26/23 0954 06/25/23 1008 07/05/23 0615 07/13/23 0547 07/15/23 0500 07/16/23 0334 07/17/23 0400 07/18/23 0415  HGB 12.9*  --    < >  --    < > 10.1*  --    < >  --    < > 10.5* 8.9* 9.4* 8.8* 8.6*  MCV 90.7  --    < >  --    < > 92.2  --    < >  --    < > 84.7 85.4 85.8 86.4 89.2  VITAMINB12  --  687  --   --   --   --  1,053*  --   --   --   --   --   --   --   --   FOLATE 13.2  --   --   --   --  6.3  --   --   --   --   --   --   --   --   --   FERRITIN  --  217  --  210  --   --  561*  --  218  --   --   --   --   --   --   TIBC  --  273  --  256  --   --  175*  --  224*  --   --   --   --   --   --   IRON  --  41*  --  61  --   --  27*  --  50  --   --   --   --   --   --    < > =  values in this interval not displayed.    Cardiac Enzymes: No results for input(s): "CKTOTAL", "CKMB", "CKMBINDEX", "TROPONINI" in the last 168 hours. CBG: Recent Labs  Lab 07/17/23 1553 07/17/23 1951 07/17/23 2344 07/17/23 2355 07/18/23 0342  GLUCAP 203* 205* 202* 219* 115*    Iron Studies: No results for input(s): "IRON", "TIBC", "TRANSFERRIN", "FERRITIN" in the last 72 hours. Studies/Results: No results found.   Medications: Infusions:  feeding supplement (VITAL 1.5 CAL) 55 mL/hr at 07/18/23 0600   norepinephrine (LEVOPHED) Adult infusion Stopped (07/17/23 2102)   piperacillin-tazobactam 100 mL/hr at 07/18/23 0600   prismasol BGK 4/2.5 400 mL/hr at 07/17/23 1323   prismasol BGK 4/2.5 400 mL/hr at 07/17/23 1325   prismasol BGK 4/2.5 1,500 mL/hr at 07/17/23 1846   vasopressin 0.01 Units/min (07/18/23 0600)    Scheduled Medications:  arformoterol  15 mcg Nebulization BID   budesonide (PULMICORT) nebulizer solution  0.5 mg Nebulization BID   Chlorhexidine Gluconate Cloth  6 each Topical Q0600   feeding supplement (PROSource TF20)  60 mL Per Tube QID   folic acid  1 mg Per Tube Daily   insulin  aspart  0-15 Units Subcutaneous Q4H   lactulose  10 g Per Tube BID   midodrine  10 mg Per Tube Q8H   nicotine  14 mg Transdermal Daily   mouth rinse  15 mL Mouth Rinse 4 times per day   pantoprazole (PROTONIX) IV  40 mg Intravenous Q24H   QUEtiapine  50 mg Per Tube BID   thiamine  100 mg Per Tube Daily    have reviewed scheduled and prn medications.  Physical Exam: General: eyes open and conversant but confused Heart:RRR, s1s2 nl Lungs: Clear bilateral. Abdomen:soft, distended Extremities + leg edema  Dialysis Access: Lt femoral temp HD line- .placed on 3/7  Glenn Christo W 07/18/2023,7:30 AM  LOS: 13 days

## 2023-07-18 NOTE — Progress Notes (Signed)
 Daily Progress Note   Patient Name: [name] [name]       Date: 07/18/2023 DOB: [birth year]  Age: 58 y.o. MRN#: [id number] Attending Physician: Oretha Milch, MD Primary Care Physician: Benetta Spar, MD Admit Date: 07/05/2023  Reason for Consultation/Follow-up: Establishing goals of care  Subjective: I have reviewed medical records including EPIC notes, MAR, any available advanced directives as necessary, and labs. Received report from primary RN - no acute concerns.  RN reports patient remains confused, now off pressor support.  Went to visit patient at bedside - no family/visitors present.  Patient was lying in bed - he is lethargic and minimally responsive today. No signs or non-verbal gestures of pain or discomfort noted. No respiratory distress, increased work of breathing, or secretions noted.  Coretrak in use.  12:03 PM Conference called patient's sister/[name], aunt/[name], granddaughter/Mariesha -emotional support provided to family.  Patient's interval history since admission reviewed in detail per family request.  Provided updates per my assessment today.  Reviewed patient's acute medical situation in context of his comorbidities.  Family understand that he is not a liver transplant candidate and is also not a candidate for long-term outpatient HD.  Provided updates that CRRT had been discontinued today.  Education provided that renal function limits medical team's ability to perform contrasted studies for further imaging.  They understand his liver cirrhosis and CKD are noncurable, progressive diseases for which patient is reaching end stages.  Reviewed options of continuing aggressive interventions knowing it will likely not change patient's overall outcome versus transition to full  comfort care.  We talked about transition to comfort measures in house and what that would entail inclusive of medications to control pain, dyspnea, agitation, nausea, and itching. We discussed stopping all unnecessary measures such as blood draws, needle sticks, oxygen, antibiotics, CBGs/insulin, cardiac monitoring, IVF, and frequent vital signs. Education provided that other non-pharmacological interventions would be utilized for holistic support and comfort such as spiritual support if requested, repositioning, music therapy, offering comfort feeds, and/or therapeutic listening. All care would focus on how the patient is looking and feeling.   Prognosis reviewed.  We discussed that time is limited no matter which path is chosen (aggressive versus comfort).  They understand that, unfortunately, there are no good long-term options here.  Medical recommendation was given for patient's transition to  full comfort care.  Answered questions regarding appropriate time for family visitation - encouraged family visitation sooner than later as it is anticipated patient will likely continue to decline despite aggressive versus comfort interventions.  Family requested to discuss information privately prior to making final decisions.  They will call PMT this afternoon if any final decisions have been reached; however, otherwise are open to PMT follow-up tomorrow for final decisions.  All questions and concerns addressed. Encouraged to call with questions and/or concerns. PMT number provided provided.  Length of Stay: 13  Current Medications: Scheduled Meds:   arformoterol  15 mcg Nebulization BID   budesonide (PULMICORT) nebulizer solution  0.5 mg Nebulization BID   Chlorhexidine Gluconate Cloth  6 each Topical Q0600   feeding supplement (PROSource TF20)  60 mL Per Tube QID   folic acid  1 mg Per Tube Daily   insulin aspart  0-15 Units Subcutaneous Q4H   lactulose  10 g Per Tube BID   midodrine  10 mg  Per Tube Q8H   nicotine  14 mg Transdermal Daily   mouth rinse  15 mL Mouth Rinse 4 times per day   pantoprazole (PROTONIX) IV  40 mg Intravenous Q24H   QUEtiapine  50 mg Per Tube BID   thiamine  100 mg Per Tube Daily    Continuous Infusions:  feeding supplement (VITAL 1.5 CAL) 55 mL/hr at 07/18/23 1100   norepinephrine (LEVOPHED) Adult infusion Stopped (07/17/23 2102)   piperacillin-tazobactam (ZOSYN)  IV 2.25 g (07/18/23 1102)   prismasol BGK 4/2.5 400 mL/hr at 07/17/23 1323   prismasol BGK 4/2.5 400 mL/hr at 07/17/23 1325   prismasol BGK 4/2.5 1,500 mL/hr at 07/17/23 1846   vasopressin Stopped (07/18/23 0817)    PRN Meds: alteplase, haloperidol lactate, ipratropium-albuterol, [DISCONTINUED] ondansetron **OR** ondansetron (ZOFRAN) IV, mouth rinse, mouth rinse, oxyCODONE, sodium chloride  Physical Exam Vitals and nursing note reviewed.  Constitutional:      General: He is not in acute distress.    Appearance: He is ill-appearing.  Pulmonary:     Effort: No respiratory distress.  Skin:    General: Skin is warm and dry.  Neurological:     Mental Status: He is lethargic.     Motor: Weakness present.             Vital Signs: BP (!) 91/55   Pulse (!) 114   Temp 98.9 F (37.2 C) (Oral)   Resp 18   Ht 6' (1.829 m)   Wt 83 kg   SpO2 98%   BMI 24.82 kg/m  SpO2: SpO2: 98 % O2 Device: O2 Device: Room Air O2 Flow Rate: O2 Flow Rate (L/min): 2 L/min  Intake/output summary:  Intake/Output Summary (Last 24 hours) at 07/18/2023 1148 Last data filed at 07/18/2023 1100 Gross per 24 hour  Intake 2445.15 ml  Output 1484 ml  Net 961.15 ml   LBM: Last BM Date : 07/17/23 Baseline Weight: Weight: 73.9 kg Most recent weight: Weight: 83 kg       Palliative Assessment/Data: PPS 30% with tube feeds      Patient Active Problem List   Diagnosis Date Noted   Protein-calorie malnutrition, severe 07/12/2023   Palliative care by specialist 07/10/2023   Goals of care,  counseling/discussion 07/10/2023   Elevated liver enzymes 07/08/2023   Coagulopathy (HCC) 07/07/2023   Somnolence 07/07/2023   Septic shock (HCC) 07/07/2023   Shock liver 07/06/2023   Need for acute hemodialysis (HCC) 07/06/2023   Acute biliary  pancreatitis 07/06/2023   Dog bite 03/12/2023   Acute pancreatitis 03/11/2023   Metabolic acidosis 09/25/2022   Bladder outlet obstruction 09/25/2022   Acute pancreatitis without infection or necrosis 05/24/2022   Alcohol-induced acute pancreatitis 05/23/2022   Cholelithiasis 05/20/2022   Tobacco use disorder 05/20/2022   GERD (gastroesophageal reflux disease) 05/20/2022   B12 deficiency 03/15/2022   History of colonic polyps 03/09/2022   Gallbladder polyp 03/01/2022   Alcohol abuse 11/28/2021   Upper abdominal pain 08/11/2021   AKI (acute kidney injury) (HCC) 08/11/2021   Portal hypertension (HCC) 08/03/2021   Other ascites 08/03/2021   Moderate protein-calorie malnutrition (HCC) 08/03/2021   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   Elevated ferritin 08/26/2020   Hypomagnesemia 08/11/2020   Hypoalbuminemia 08/11/2020   Hypotension 08/11/2020   Anemia of chronic disease 08/11/2020   DVT (deep venous thrombosis) -Lt LL Extensive DVT 08/05/2020   Alcoholic cirrhosis (HCC) 08/05/2020   COPD (chronic obstructive pulmonary disease) (HCC) 08/05/2020   Hypokalemia 08/05/2020   Cirrhosis of liver without ascites (HCC) 08/04/2020   Edema of left lower extremity 08/04/2020   Loss of weight 07/07/2020   Loss of appetite 07/07/2020   Anasarca 07/07/2020   Heme + stool 01/22/2017   Abnormal LFTs 01/22/2017   Hyperlipidemia 02/08/2015   Cigarette nicotine dependence with nicotine-induced disorder 02/08/2015   Leukocytosis 01/07/2013   Hyponatremia 01/07/2013   UTI (urinary tract infection) 01/06/2013   Kidney stone 01/06/2013   Ureteral colic 01/06/2013    Palliative Care Assessment & Plan   Patient Profile: 58 y.o.  male  with past medical history of hypertension, COPD, alcoholic liver cirrhosis, hemosiderosis, CKD 3B, dyslipidemia, prior left lower extremity DVT presenting to [facility] health ED secondary to 3 to 4-day complaint of abdominal pain. He was admitted on 07/05/2023 with acute pancreatitis with septic shock and lactic acidosis, decompensated EtOH cirrhosis complicated by shock liver with associated coagulopathy and thrombocytopenia, acute encephalopathy multifactorial, and others.    He was transferred to [facility] for higher level of care.  He has since had a clinical decompensation.  See significant hospital events below.  Assessment: Principal Problem:   Acute pancreatitis Active Problems:   Alcoholic cirrhosis (HCC)   Shock liver   Need for acute hemodialysis (HCC)   Acute biliary pancreatitis   Coagulopathy (HCC)   Somnolence   Septic shock (HCC)   Elevated liver enzymes   Palliative care by specialist   Goals of care, counseling/discussion   Protein-calorie malnutrition, severe   Concern about end of life  Recommendations/Plan: Continue current plan of care Continue DNR-intervene as previously documented Family considering patient's transition to full comfort care and requested to discuss information from today privately - they will call PMT this afternoon if any final decisions are made; if not, they are open to PMT follow-up tomorrow for final decisions  PMT will continue to follow and support holistically  Goals of Care and Additional Recommendations: Limitations on Scope of Treatment: Full Scope Treatment  Code Status:    Code Status Orders  (From admission, onward)           Start     Ordered   07/12/23 1619  Do not attempt resuscitation (DNR) Pre-Arrest Interventions Desired  (Code Status)  Continuous       Question Answer Comment  If pulseless and not breathing No CPR or chest compressions.   In Pre-Arrest Conditions (Patient Has Pulse and Is Breathing) May  intubate, use advanced airway interventions and cardioversion/ACLS  medications if appropriate or indicated. May transfer to ICU.   Consent: Discussion documented in EHR or advanced directives reviewed      07/12/23 1618           Code Status History     Date Active Date Inactive Code Status Order ID Comments User Context   07/06/2023 2158 07/12/2023 1618 Full Code [id number]  Marylu Lund Inpatient   07/05/2023 1312 07/06/2023 2158 Full Code [id number]  Erick Blinks, DO ED   03/12/2023 0241 03/14/2023 1553 Full Code [id number]  Lilyan Gilford, DO Inpatient   09/23/2022 0352 09/25/2022 1624 Full Code [id number]  Lilyan Gilford, DO ED   05/19/2022 2232 05/24/2022 1733 Full Code [id number]  Zierle-Ghosh, Asia B, DO ED   03/02/2022 1558 03/04/2022 1717 Full Code [id number]  Erick Blinks, DO Inpatient   02/09/2021 2224 02/10/2021 2220 Full Code [id number]  Elgergawy, Leana Roe, MD Inpatient   08/05/2020 1619 08/11/2020 2231 Full Code [id number]  Shon Hale, MD Inpatient   01/06/2013 2358 01/09/2013 1651 Full Code [id number]  Vania Rea, MD Inpatient       Prognosis:  poor  Discharge Planning: To Be Determined  Care plan was discussed with primary RN, patient's family, nephrology, Dr. Vassie Loll  Thank you for allowing the Palliative Medicine Team to assist in the care of this patient.   Total Time 65 minutes Prolonged Time Billed  yes       Haskel Khan, NP  Please contact Palliative Medicine Team phone at [phone] for questions and concerns.   *Portions of this note are a verbal dictation therefore any spelling and/or grammatical errors are due to the "Dragon Medical One" system interpretation.

## 2023-07-18 NOTE — Progress Notes (Signed)
 PHARMACY NOTE:  ANTIMICROBIAL RENAL DOSAGE ADJUSTMENT  Current antimicrobial regimen includes a mismatch between antimicrobial dosage and estimated renal function.  As per policy approved by the Pharmacy & Therapeutics and Medical Executive Committees, the antimicrobial dosage will be adjusted accordingly.  Current antimicrobial dosage:  Zosyn 3.375gm IV Q6H, 30 min infusion  Indication: empiric  Renal Function:  Estimated Creatinine Clearance: 31.9 mL/min (A) (by C-G formula based on SCr of 2.77 mg/dL (H)). []      On intermittent HD, scheduled: [x]      On CRRT - now off as of 07/17/23    Antimicrobial dosage has been changed to:  Zosyn 2.25gm IV Q8H  Blondine Hottel D. Laney Potash, PharmD, BCPS, BCCCP 07/18/2023, 8:18 AM

## 2023-07-19 DIAGNOSIS — A419 Sepsis, unspecified organism: Secondary | ICD-10-CM | POA: Diagnosis not present

## 2023-07-19 DIAGNOSIS — K851 Biliary acute pancreatitis without necrosis or infection: Secondary | ICD-10-CM | POA: Diagnosis not present

## 2023-07-19 DIAGNOSIS — K7031 Alcoholic cirrhosis of liver with ascites: Secondary | ICD-10-CM | POA: Diagnosis not present

## 2023-07-19 DIAGNOSIS — N179 Acute kidney failure, unspecified: Secondary | ICD-10-CM | POA: Diagnosis not present

## 2023-07-19 DIAGNOSIS — F1090 Alcohol use, unspecified, uncomplicated: Secondary | ICD-10-CM | POA: Diagnosis not present

## 2023-07-19 DIAGNOSIS — K7200 Acute and subacute hepatic failure without coma: Secondary | ICD-10-CM | POA: Diagnosis not present

## 2023-07-19 DIAGNOSIS — R579 Shock, unspecified: Secondary | ICD-10-CM | POA: Diagnosis not present

## 2023-07-19 DIAGNOSIS — Z515 Encounter for palliative care: Secondary | ICD-10-CM | POA: Diagnosis not present

## 2023-07-19 DIAGNOSIS — R1084 Generalized abdominal pain: Secondary | ICD-10-CM | POA: Diagnosis not present

## 2023-07-19 DIAGNOSIS — K852 Alcohol induced acute pancreatitis without necrosis or infection: Secondary | ICD-10-CM | POA: Diagnosis not present

## 2023-07-19 DIAGNOSIS — Z7189 Other specified counseling: Secondary | ICD-10-CM | POA: Diagnosis not present

## 2023-07-19 LAB — PHOSPHORUS: Phosphorus: 3 mg/dL (ref 2.5–4.6)

## 2023-07-19 LAB — PROTIME-INR
INR: 1.7 — ABNORMAL HIGH (ref 0.8–1.2)
Prothrombin Time: 20 s — ABNORMAL HIGH (ref 11.4–15.2)

## 2023-07-19 LAB — GLUCOSE, CAPILLARY
Glucose-Capillary: 221 mg/dL — ABNORMAL HIGH (ref 70–99)
Glucose-Capillary: 241 mg/dL — ABNORMAL HIGH (ref 70–99)
Glucose-Capillary: 209 mg/dL — ABNORMAL HIGH (ref 70–99)
Glucose-Capillary: 184 mg/dL — ABNORMAL HIGH (ref 70–99)
Glucose-Capillary: 192 mg/dL — ABNORMAL HIGH (ref 70–99)
Glucose-Capillary: 246 mg/dL — ABNORMAL HIGH (ref 70–99)

## 2023-07-19 LAB — RENAL FUNCTION PANEL
Albumin: 1.8 g/dL — ABNORMAL LOW (ref 3.5–5.0)
Anion gap: 13 (ref 5–15)
BUN: 74 mg/dL — ABNORMAL HIGH (ref 6–20)
CO2: 19 mmol/L — ABNORMAL LOW (ref 22–32)
Calcium: 7.8 mg/dL — ABNORMAL LOW (ref 8.9–10.3)
Chloride: 105 mmol/L (ref 98–111)
Creatinine, Ser: 4.34 mg/dL — ABNORMAL HIGH (ref 0.61–1.24)
GFR, Estimated: 15 mL/min — ABNORMAL LOW (ref >60)
Glucose, Bld: 256 mg/dL — ABNORMAL HIGH (ref 70–99)
Phosphorus: 3.3 mg/dL (ref 2.5–4.6)
Potassium: 3.9 mmol/L (ref 3.5–5.1)
Sodium: 137 mmol/L (ref 135–145)

## 2023-07-19 LAB — MAGNESIUM: Magnesium: 2.3 mg/dL (ref 1.7–2.4)

## 2023-07-19 NOTE — TOC Progression Note (Addendum)
 Transition of Care [facility]) - Progression Note    Patient Details  Name: [name] [name] MRN: [id number] Date of Birth: 04-11-1966  [facility] of [facility]) CM/SW Contact  Tom-Johnson, Hershal Coria, RN Phone Number: 07/19/2023, 12:58 PM  Clinical Narrative:     [facility] for [facility]. Family requests [facility] in [place]. CM called and spoke with Kwante ([phone]), Intake Coordinator and she states a nurse will be sent to assess patient but there is no empty bed available at this time. States there is a wait list and will contact CM when a beds becomes available.   CM will continue to follow.     Barriers to Discharge: Continued Medical Work up  Expected Discharge Plan and Services       Living arrangements for the past 2 months: Single Family Home                                       Social Determinants of Health (SDOH) Interventions SDOH Screenings   Food Insecurity: No Food Insecurity (07/06/2023)  Housing: Low Risk  (07/06/2023)  Transportation Needs: No Transportation Needs (07/06/2023)  Utilities: Not At Risk (07/06/2023)  Alcohol Screen: Low Risk  (09/13/2020)  Financial Resource Strain: High Risk (11/28/2021)   [facility]  Physical Activity: Sufficiently Active (09/13/2020)  Social Connections: Moderately Isolated (09/13/2020)  Stress: No Stress Concern Present (09/13/2020)  Tobacco Use: High Risk (07/05/2023)    Readmission Risk Interventions    03/13/2023    2:50 PM 05/22/2022    9:08 AM  Readmission Risk Prevention Plan  Transportation Screening Complete Complete  HRI or Home Care Consult Complete Complete  Social Work Consult for Recovery Care Planning/Counseling Complete Complete  Palliative Care Screening Not Applicable Not Applicable  Medication Review Oceanographer) Complete Complete

## 2023-07-19 NOTE — Inpatient Diabetes Management (Signed)
 Inpatient Diabetes Program Recommendations  AACE/ADA: New Consensus Statement on Inpatient Glycemic Control (2015)  Target Ranges:  Prepandial:   less than 140 mg/dL      Peak postprandial:   less than 180 mg/dL (1-2 hours)      Critically ill patients:  140 - 180 mg/dL   Lab Results  Component Value Date   GLUCAP 241 (H) 07/19/2023   HGBA1C 4.9 07/14/2023    Review of Glycemic Control  Latest Reference Range & Units 07/18/23 03:42 07/18/23 08:11 07/18/23 11:55 07/18/23 15:47 07/18/23 20:05 07/18/23 23:42 07/19/23 04:02 07/19/23 07:54  Glucose-Capillary 70 - 99 mg/dL 425 (H) 956 (H) 387 (H) 223 (H) 310 (H) 270 (H) 221 (H) 241 (H)   Current orders for Inpatient glycemic control:  Novolog 0-15 units q 4 hours  Inpatient Diabetes Program Recommendations:    If appropriate, may want to add low dose tube feed coverage if CBG's remain>200 mg/dL.   Thanks,  Lorenza Cambridge, RN, BC-ADM Inpatient Diabetes Coordinator Pager [phone]  (8a-5p)

## 2023-07-19 NOTE — Progress Notes (Signed)
 [facility] NEPHROLOGY PROGRESS NOTE  Assessment/ Plan: Pt is a 58 y.o. 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   # Dialysis dependent anuric AKI on CKD3b likely ischemic ATN, started HD 07/06/2023 using left femoral temp HD catheter placed by surgery at [facility]. - CRRT 3/9-3/12.  Remains extubated, more alert but confused.  Not much urine output (oliguric even when not on CRRT).  BUN and crt rising daily  off of CRRT .  Failed lasix challenge.  Noted palliative care discussion about goals of care-  no decisions made as of yet re dialysis . Hypotensive and didn't respond to fluid challenge. CRRT 3/16-3/18, on midodrine (CRRT orders were 4K bath, UF net even when SBP was only in 80's; already on midodrine 10 mg 3 times daily)  VERY poor candidate for long term HD; would not offer -  MELD gives a high level of mortality and with his confusion is not ideal-  he is looking at SNF and NOT a candidate for long term dialysis with associated poor quality of life + hypotensive and wouldn't tolerate.    Appreciate palliative meetings with family members.  Signing off at this time; please reconsult as needed.  # Septic shock: low dose Levo   #Severe gallstone pancreatitis with transaminitis: Per critical care.  #Anion gap metabolic acidosis/lactic acidosis: Improving.  # Hyperkalemia resolved  Anemia-  had not been a major issue -  but now dropped 2 grams-  concern for [facility] but did not have on imaging-  supportive care    Palliative has engaged with family -  is DNR but no other limits of care have been placed  Subjective:  Urine output  oliguric range (really closer to anuric) currently off CRRT on 3/18. Confused but not agitated.  Objective Vital signs in last 24 hours: Vitals:   07/19/23 1015 07/19/23 1030 07/19/23 1045 07/19/23 1100  BP:  98/61  110/64 (!) 86/60  Pulse: (!) 120 (!) 127 (!) 124 (!) 119  Resp: 20 (!) 24 20 17   Temp:      TempSrc:      SpO2: 97% 97% 97% 97%  Weight:      Height:       Weight change:   Intake/Output Summary (Last 24 hours) at 07/19/2023 1112 Last data filed at 07/19/2023 1100 Gross per 24 hour  Intake 1857.81 ml  Output 1250 ml  Net 607.81 ml       Labs: RENAL PANEL Recent Labs  Lab 07/15/23 0500 07/15/23 1536 07/16/23 0334 07/16/23 1233 07/17/23 0358 07/17/23 1614 07/18/23 0415 07/18/23 1600 07/19/23 0426  NA 134*  134*   < >  --    < > 136 134* 134*  135 136 137  K 3.6  3.6   < >  --    < > 3.9 4.0 4.2  4.2 4.1 3.9  CL 101  100   < >  --    < > 104 102 104  102 104 105  CO2 18*  18*   < >  --    < > 22 22 23   21* 18* 19*  GLUCOSE 237*  235*   < >  --    < > 191* 221* 283*  295* 294* 256*  BUN 87*  86*   < >  --    < > 31* 29* 44*  42*  60* 74*  CREATININE 5.16*  5.13*   < >  --    < > 2.25* 2.08* 2.84*  2.77* 3.54* 4.34*  CALCIUM 7.5*  7.6*   < >  --    < > 7.5* 7.6* 7.4*  7.6* 7.5* 7.8*  MG 2.6*  --  2.4  --  2.3  --  2.3  --  2.3  PHOS 4.4  4.4   < > 3.3   < > 3.2  3.1 3.1 3.2  3.3 3.2 3.3  3.0  ALBUMIN 2.5*  2.5*   < >  --    < > 1.9* 2.2* 2.0* 1.8* 1.8*   < > = values in this interval not displayed.    Liver Function Tests: Recent Labs  Lab 07/13/23 0547 07/14/23 0230 07/14/23 1509 07/15/23 0500 07/15/23 1536 07/18/23 0415 07/18/23 1600 07/19/23 0426  AST 161*  --  120* 91*  --   --   --   --   ALT 224*  --  139* 109*  --   --   --   --   ALKPHOS 167*  --  185* 153*  --   --   --   --   BILITOT 13.3*  --  14.8* 17.2*  --   --   --   --   PROT 6.8  --  7.0 6.9  --   --   --   --   ALBUMIN 1.8*  1.8*   < > 2.1* 2.5*  2.5*   < > 2.0* 1.8* 1.8*   < > = values in this interval not displayed.   Recent Labs  Lab 07/13/23 0547 07/14/23 1509 07/16/23 1007  LIPASE 150* 302* 211*   No results for input(s): "AMMONIA" in the last 168  hours.  CBC: Recent Labs    09/22/22 0926 09/22/22 0927 09/22/22 1003 09/29/22 1052 12/02/22 2225 03/19/23 1359 03/19/23 1400 03/26/23 0954 06/25/23 1008 07/05/23 0615 07/13/23 0547 07/15/23 0500 07/16/23 0334 07/17/23 0400 07/18/23 0415  HGB 12.9*  --    < >  --    < > 10.1*  --    < >  --    < > 10.5* 8.9* 9.4* 8.8* 8.6*  MCV 90.7  --    < >  --    < > 92.2  --    < >  --    < > 84.7 85.4 85.8 86.4 89.2  VITAMINB12  --  687  --   --   --   --  1,053*  --   --   --   --   --   --   --   --   FOLATE 13.2  --   --   --   --  6.3  --   --   --   --   --   --   --   --   --   FERRITIN  --  217  --  210  --   --  561*  --  218  --   --   --   --   --   --   TIBC  --  273  --  256  --   --  175*  --  224*  --   --   --   --   --   --   IRON  --  41*  --  61  --   --  27*  --  50  --   --   --   --   --   --    < > = values in this interval not displayed.    Cardiac Enzymes: No results for input(s): "CKTOTAL", "CKMB", "CKMBINDEX", "TROPONINI" in the last 168 hours. CBG: Recent Labs  Lab 07/18/23 1547 07/18/23 2005 07/18/23 2342 07/19/23 0402 07/19/23 0754  GLUCAP 223* 310* 270* 221* 241*    Iron Studies: No results for input(s): "IRON", "TIBC", "TRANSFERRIN", "FERRITIN" in the last 72 hours. Studies/Results: No results found.   Medications: Infusions:  feeding supplement (VITAL 1.5 CAL) 55 mL/hr at 07/19/23 1100   norepinephrine (LEVOPHED) Adult infusion 7 mcg/min (07/19/23 1100)   piperacillin-tazobactam (ZOSYN)  IV Stopped (07/19/23 0510)    Scheduled Medications:  arformoterol  15 mcg Nebulization BID   budesonide (PULMICORT) nebulizer solution  0.5 mg Nebulization BID   Chlorhexidine Gluconate Cloth  6 each Topical Daily   feeding supplement (PROSource TF20)  60 mL Per Tube QID   folic acid  1 mg Per Tube Daily   insulin aspart  0-15 Units Subcutaneous Q4H   midodrine  10 mg Per Tube Q8H   nicotine  14 mg Transdermal Daily   mouth rinse  15 mL Mouth Rinse  4 times per day   pantoprazole (PROTONIX) IV  40 mg Intravenous Q24H   QUEtiapine  50 mg Per Tube BID   thiamine  100 mg Per Tube Daily    have reviewed scheduled and prn medications.  Physical Exam: General: eyes open and conversant but confused Heart:RRR, s1s2 nl Lungs: Clear bilateral. Abdomen:soft, distended Extremities + leg edema  Dialysis Access: Lt femoral temp HD line- .placed on 3/7  Jsean Taussig W 07/19/2023,11:12 AM  LOS: 14 days

## 2023-07-19 NOTE — Plan of Care (Signed)
  Problem: Education: Goal: Knowledge of General Education information will improve Description: Including pain rating scale, medication(s)/side effects and non-pharmacologic comfort measures Outcome: Not Progressing   Problem: Health Behavior/Discharge Planning: Goal: Ability to manage health-related needs will improve Outcome: Not Progressing   Problem: Clinical Measurements: Goal: Ability to maintain clinical measurements within normal limits will improve Outcome: Not Progressing Goal: Will remain free from infection Outcome: Not Progressing Goal: Diagnostic test results will improve Outcome: Not Progressing Goal: Respiratory complications will improve Outcome: Not Progressing Goal: Cardiovascular complication will be avoided Outcome: Not Progressing   Problem: Activity: Goal: Risk for activity intolerance will decrease Outcome: Not Progressing   Problem: Nutrition: Goal: Adequate nutrition will be maintained Outcome: Not Progressing   Problem: Coping: Goal: Level of anxiety will decrease Outcome: Not Progressing   Problem: Elimination: Goal: Will not experience complications related to bowel motility Outcome: Not Progressing Goal: Will not experience complications related to urinary retention Outcome: Not Progressing   Problem: Pain Managment: Goal: General experience of comfort will improve and/or be controlled Outcome: Not Progressing   Problem: Safety: Goal: Ability to remain free from injury will improve Outcome: Not Progressing   Problem: Skin Integrity: Goal: Risk for impaired skin integrity will decrease Outcome: Not Progressing   Problem: Safety: Goal: Non-violent Restraint(s) Outcome: Not Progressing   Problem: Education: Goal: Ability to describe self-care measures that may prevent or decrease complications (Diabetes Survival Skills Education) will improve Outcome: Not Progressing Goal: Individualized Educational Video(s) Outcome: Not  Progressing   Problem: Coping: Goal: Ability to adjust to condition or change in health will improve Outcome: Not Progressing   Problem: Fluid Volume: Goal: Ability to maintain a balanced intake and output will improve Outcome: Not Progressing   Problem: Health Behavior/Discharge Planning: Goal: Ability to identify and utilize available resources and services will improve Outcome: Not Progressing Goal: Ability to manage health-related needs will improve Outcome: Not Progressing   Problem: Metabolic: Goal: Ability to maintain appropriate glucose levels will improve Outcome: Not Progressing   Problem: Nutritional: Goal: Maintenance of adequate nutrition will improve Outcome: Not Progressing Goal: Progress toward achieving an optimal weight will improve Outcome: Not Progressing   Problem: Skin Integrity: Goal: Risk for impaired skin integrity will decrease Outcome: Not Progressing   Problem: Tissue Perfusion: Goal: Adequacy of tissue perfusion will improve Outcome: Not Progressing

## 2023-07-19 NOTE — Progress Notes (Signed)
 NAME:  [name] [name], MRN:  [id number], DOB:  1965/12/29, LOS: 14 ADMISSION DATE:  07/05/2023, CONSULTATION DATE:  07/06/2023 REFERRING MD:  Randye Lobo, CHIEF COMPLAINT:  Abdominal Pain   History of Present Illness:  [name] [name] is a 58 year old gentleman with a known medical history significant for hypertension, COPD, alcoholic liver cirrhosis, hemosiderosis, CKD 3B, dyslipidemia, prior left lower extremity DVT presenting to [facility] health ED secondary to 3 to 4-day complaint of abdominal pain.  Patient describes bilateral flank type pain increasing in intensity without radiation to groin chest or back.  He states this is similar pain to his previous admission for pancreatitis.  Patient does elicit nausea and vomiting and diarrhea.   It is noted that the patient does have history of cholelithiasis and decompensated cirrhosis.  Patient reports he has not had any alcohol since his birthday on [birth year], however according to GI note he made comments that his last drink was on this past Sunday.  CT of the abdomen pelvis without contrast performed on July 05, 2023 reveals atrophic left kidney, gallstones and diffuse pancreatitis  Abdomen Limited ultrasound performed today 07/06/2023 reveals no significant abdominal ascites.  Laboratory indices: He does have elevation in coagulation studies with a PT of 26 on yesterday that elevated to 50.4 today 3 10/31/2023 and INR at admission yesterday 2.4 elevated to 5.5 today.  CBC: WBC 15.3, hemoglobin 11, hematocrit 36.7, platelets 164.  BMP sodium 132, potassium 5.8, chloride 99, CO2 14 BUN 40, creatinine 4.81 and glucose 214.  LFTs: Lipase 2929, AST 5021, ALT 05/08/2007.  Secondary to patient's known CKD presenting now with AKI and presence of oliguria a temporary HD line was placed by surgery team today for hemodialysis.  Patient underwent 2-hour HD in the ED prior to transfer.  Patient did receive IV fluids for pancreatitis management, as he is a poor candidate for  liver transplant due to ongoing alcohol use per GI note.  Consults: Patient has been followed by nephrology and gastrointestinal services Blood cultures sent 07/06/2023 secondary to leukocytosis.  As such patient was transferred to [facility] for ICU monitoring and management and admitted with acute on chronic liver failure/pancreatitis.  Pertinent  Medical History  Asthma, B12 deficiency, cirrhosis, dyspnea, hyperlipidemia, history of kidney stones, hypertension, kidney stones, COPD, prior lower left extremity DVT, chronic anemia.  Significant Hospital Events: Including procedures, antibiotic start and stop dates in addition to other pertinent events   3/7 Hemodialysis  (temporary HD line placed) s/p short HD, tx to [facility] 3/8 worsening shock, pressors, CTH/ CT a/p 3/9 intubated. On 3 pressors. Renal fxn and lactic acidosis worse. Started CRRT 3/10 LFTs a little better. Still on 3 pressors. Comfortable efforts on PSV but mental status not amendable to extubation. ECHO EF 55% no WMA, grade I diastolic dysfxn, started stress dose steroids. Right internal jugular CVL placed. The epi was weaned off over course of am after stress dose steroids started, NE weaning stopped fent gtt changed to PRN. D10 rate reduced to 10cc/hr. Acetylcysteine completed  3/11 vasopressin discontinued on very low-dose norepinephrine starting to wean sedation further to assess for readiness to wean ventilation but not responding and appeared to have left gaze pref after sedation off for prolonged time. CT head obtained no acute findings. Abx switched to zosyn 3/12 woke up during evening. Agitated at times. Attempting to get OOB. Still seeming to have left sided weakness but exam much closer to that of the 10th. Norepi off.  Extubated.  Taken off  CRRT, encephalopathic but tolerated extubation 3/13 agitated during the evening hours, hypertensive and tachycardic moaning.  Started on Precedex.  This was discontinued on a.m. rounds.   Remains off pressors remains off dialysis minimal urine output.  Had a goals of care discussion with palliative and critical care.  Critical care recommending DO NOT RESUSCITATE, also shared with family patient's appropriateness for hospice going forward.  Family taking CODE STATUS into advisement with plan to follow-up 3/14 Patient was agitated overnight, pulled out his core track. Off vasopressors 3/15 Back on pressors including levo and vaso, likely secondary to volume depletion given improvement with albumin but antibiotics and steroids also resumed 3/16 nephrology evaluated and decision made to start back CRRT.  Repeat CT chest abdomen pelvis with ongoing solid pancreatitis and mediastinal adenopathy 3/17 Some agitation overnight, back on pressors this morning 3/19 remains off CRRT, pending goals of care discussion 3/20 remains on Levophed at 7, off CRRT.  Awaiting formal decision per family for goals of care  Interim History / Subjective:  Minimally responsive  Objective   Blood pressure 96/60, pulse (!) 124, temperature 98.9 F (37.2 C), temperature source Axillary, resp. rate (!) 21, height 6' (1.829 m), weight 86.5 kg, SpO2 96%.        Intake/Output Summary (Last 24 hours) at 07/19/2023 0929 Last data filed at 07/19/2023 0900 Gross per 24 hour  Intake 1780.09 ml  Output 1195 ml  Net 585.09 ml   Filed Weights   07/15/23 0308 07/17/23 0343 07/19/23 0500  Weight: 84 kg 83 kg 86.5 kg    Examination:  General: Acute on chronic ill-appearing deconditioned middle-aged male lying in bed in no acute distress HEENT: [place]/AT, MM pink/moist, PERRL,  Neuro: Minimally responsive CV: s1s2 regular rate and rhythm, no murmur, rubs, or gallops,  PULM: Decreased breath sounds bilaterally, mild increased work of breathing on RA GI: soft, bowel sounds active in all 4 quadrants, non-tender, non-distended, tolerating TF Extremities: warm/dry, generalized edema  Skin: no rashes or  lesions  Resolved Hospital Problem list   Lactic acid Acute hypoxic respiratory failure resolved extubated 3/12 Severe sepsis with septic shock, POA now resolved Aspiration pneumonia -Completed 7 days of IV Zosyn 3/14  Assessment & Plan:   Septic versus vasodilatory shock -culture-negative -Initially resolved, back on pressors 3/15-3/16, now again 3/18 Some concern regarding pancreatic fluid collection but renal function has been the limiting factor for additional imaging. Completed 3 days merrem then 7 days zosyn 3/17. P: Remains on low-dose pressors for MAP goal greater than 65 Continue midodrine Trend CBC and fever curve Ongoing goals of care discussion, hold on further imaging until decision made Trend CBC and fever curve  Acute on chronic pancreatitis secondary to gallstone versus alcohol use -Lipase downtrending but remains elevated -Repeat chest abdomen pelvis 3/15 with persistent mediastinal adenopathy and enlarged edematous pancreas with surrounding inflammation compatible with persistent acute pancreatitis History of pancreatic divisum on MRCP Acute on chronic liver failure Alcoholic cirrhosis -MELD 35 Coagulopathy of liver disease P: Appreciate GIs assistance Stop lactulose given high stool output Again hold on further imaging including MRCP until goals of care discussion completed Monitor for signs of ileus  Dialysis dependent anuric AKI superimposed on CKD stage IIIb likely secondary to ischemic ATN -CRRT utilized 3/9 to 3/12 -CRRT resumed 3/16 P: Nephrology following, appreciate assistance CRRT currently on hold, patient remains a very poor candidate for ongoing dialysis Supportive care  Mediastinal lymphadenopathy -Not a candidate at this time for invasive biopsy  Thrombocytopenia of  liver disease and critical illness anemia of chronic illness, improving P: Trend CBC Transfuse per protocol Hemoglobin goal greater than 7  Acute encephalopathy,  combined metabolic and hepatic, improving P: Supportive care Remains on Seroquel  Recurrent hypoglycemia now with progressively hyperglycemia P: Continue moderate scale SSI CBG goal 140-180 CBG checks every 4  Severe protein calorie malnutrition P: Tube feeds  Goals of care DNR, second meeting with family scheduled today with palliative care  Best Practice (right click and "Reselect all SmartList Selections" daily)   Diet: Cotrak ,tube feeds DVT prophylaxis prophylactic heparin  Pressure ulcer(s): N/A GI prophylaxis: PPI Lines: central line.  Femoral HD catheter  Foley: Discontinue Code Status: DNR Last date of multidisciplinary goals of care discussion [palliative care is following]  CRITICAL CARE Performed by: Adisynn Suleiman D. Harris   Total critical care time: 37 minutes  Critical care time was exclusive of separately billable procedures and treating other patients.  Critical care was necessary to treat or prevent imminent or life-threatening deterioration.  Critical care was time spent personally by me on the following activities: development of treatment plan with patient and/or surrogate as well as nursing, discussions with consultants, evaluation of patient's response to treatment, examination of patient, obtaining history from patient or surrogate, ordering and performing treatments and interventions, ordering and review of laboratory studies, ordering and review of radiographic studies, pulse oximetry and re-evaluation of patient's condition.  Shauntay Brunelli D. Harris, NP-C [facility] Personal contact information can be found on Amion  If no contact or response made please call [phone] 07/19/2023, 9:29 AM

## 2023-07-19 NOTE — Progress Notes (Signed)
 Daily Progress Note   Patient Name: [name] [name]       Date: 07/19/2023 DOB: 1966-02-27  Age: 58 y.o. MRN#: [id number] Attending Physician: Oretha Milch, MD Primary Care Physician: Benetta Spar, MD Admit Date: 07/05/2023  Reason for Consultation/Follow-up: Establishing goals of care  Subjective: I have reviewed medical records including EPIC notes, MAR, any available advanced directives as necessary, and labs. Received report from primary RN -no acute concerns.  RN reports patient remains intermittently confused and lethargic.  Went to visit patient at bedside - no family/visitors present.  Patient was lying in bed asleep - he does wake to voice/gentle touch but is very drowsy.  He is oriented only to place. No signs or non-verbal gestures of pain or discomfort noted. No respiratory distress, increased work of breathing, or secretions noted.  He remains ill and frail appearing.  Called sister/[name] - emotional support provided. She provides update that family have "not made final decisions." We reviewed again that patient is approaching end of life regardless of which path is chosen. Encouraged family to consider how patient would want to spend his last days - she expressed understanding and will continue discussions with family. She indicates she will be traveling back to [place] from [place] this weekend and is hopeful to see patient.   [name] does inquire about hospice transfer - she requests patient's transfer to [facility] in [place], as this is close to family. She is ok with hospice transfer today if they have bed availability. She understands they will not continue tube feeds, pressors, or other life prolonging interventions.  We discussed CRRT - will not resume. Otherwise,  goal is to continue current supportive interventions at this time. Family understand patient is at EOL.  All questions and concerns addressed. Encouraged to call with questions and/or concerns. PMT card previously provided.  Length of Stay: 14  Current Medications: Scheduled Meds:   arformoterol  15 mcg Nebulization BID   budesonide (PULMICORT) nebulizer solution  0.5 mg Nebulization BID   Chlorhexidine Gluconate Cloth  6 each Topical Daily   feeding supplement (PROSource TF20)  60 mL Per Tube QID   folic acid  1 mg Per Tube Daily   insulin aspart  0-15 Units Subcutaneous Q4H   midodrine  10 mg Per Tube Q8H   nicotine  14 mg Transdermal Daily   mouth rinse  15 mL Mouth Rinse 4 times per day   pantoprazole (PROTONIX) IV  40 mg Intravenous Q24H   QUEtiapine  50 mg Per Tube BID   thiamine  100 mg Per Tube Daily    Continuous Infusions:  feeding supplement (VITAL 1.5 CAL) 55 mL/hr at 07/19/23 1000   norepinephrine (LEVOPHED) Adult infusion 7 mcg/min (07/19/23 1000)   piperacillin-tazobactam (ZOSYN)  IV Stopped (07/19/23 0510)   prismasol BGK 4/2.5 400 mL/hr at 07/17/23 1323   prismasol BGK 4/2.5 400 mL/hr at 07/17/23 1325   prismasol BGK 4/2.5 1,500 mL/hr at 07/17/23 1846    PRN Meds: alteplase, haloperidol lactate, ipratropium-albuterol, [DISCONTINUED] ondansetron **OR** ondansetron (ZOFRAN) IV, mouth rinse, mouth rinse, oxyCODONE, sodium chloride  Physical Exam Vitals and nursing note reviewed.  Constitutional:      General: He is not in acute distress.    Appearance: He is ill-appearing.  Pulmonary:     Effort: No respiratory distress.  Skin:    General: Skin is warm and dry.  Neurological:     Mental Status: He is lethargic.     Motor: Weakness present.             Vital Signs: BP (!) 106/51   Pulse (!) 122   Temp 98.9 F (37.2 C) (Axillary)   Resp 19   Ht 6' (1.829 m)   Wt 86.5 kg   SpO2 97%   BMI 25.86 kg/m  SpO2: SpO2: 97 % O2 Device: O2 Device: Room  Air O2 Flow Rate: O2 Flow Rate (L/min): 2 L/min  Intake/output summary:  Intake/Output Summary (Last 24 hours) at 07/19/2023 1028 Last data filed at 07/19/2023 1000 Gross per 24 hour  Intake 1936.25 ml  Output 1220 ml  Net 716.25 ml   LBM: Last BM Date : 07/19/23 Baseline Weight: Weight: 73.9 kg Most recent weight: Weight: 86.5 kg       Palliative Assessment/Data: PPS 30% with tube feeds      Patient Active Problem List   Diagnosis Date Noted   Protein-calorie malnutrition, severe 07/12/2023   Palliative care by specialist 07/10/2023   Goals of care, counseling/discussion 07/10/2023   Elevated liver enzymes 07/08/2023   Coagulopathy (HCC) 07/07/2023   Somnolence 07/07/2023   Septic shock (HCC) 07/07/2023   Shock liver 07/06/2023   Need for acute hemodialysis (HCC) 07/06/2023   Acute biliary pancreatitis 07/06/2023   Dog bite 03/12/2023   Acute pancreatitis 03/11/2023   Metabolic acidosis 09/25/2022   Bladder outlet obstruction 09/25/2022   Acute pancreatitis without infection or necrosis 05/24/2022   Alcohol-induced acute pancreatitis 05/23/2022   Cholelithiasis 05/20/2022   Tobacco use disorder 05/20/2022   GERD (gastroesophageal reflux disease) 05/20/2022   B12 deficiency 03/15/2022   History of colonic polyps 03/09/2022   Gallbladder polyp 03/01/2022   Alcohol abuse 11/28/2021   Upper abdominal pain 08/11/2021   AKI (acute kidney injury) (HCC) 08/11/2021   Portal hypertension (HCC) 08/03/2021   Other ascites 08/03/2021   Moderate protein-calorie malnutrition (HCC) 08/03/2021   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   Elevated ferritin 08/26/2020   Hypomagnesemia 08/11/2020   Hypoalbuminemia 08/11/2020   Hypotension 08/11/2020   Anemia of chronic disease 08/11/2020   DVT (deep venous thrombosis) -Lt LL Extensive DVT 08/05/2020   Alcoholic cirrhosis (HCC) 08/05/2020   COPD (chronic obstructive pulmonary disease) (HCC) 08/05/2020    Hypokalemia 08/05/2020   Cirrhosis of liver without ascites (HCC) 08/04/2020   Edema of left  lower extremity 08/04/2020   Loss of weight 07/07/2020   Loss of appetite 07/07/2020   Anasarca 07/07/2020   Heme + stool 01/22/2017   Abnormal LFTs 01/22/2017   Hyperlipidemia 02/08/2015   Cigarette nicotine dependence with nicotine-induced disorder 02/08/2015   Leukocytosis 01/07/2013   Hyponatremia 01/07/2013   UTI (urinary tract infection) 01/06/2013   Kidney stone 01/06/2013   Ureteral colic 01/06/2013    Palliative Care Assessment & Plan   Patient Profile: 58 y.o. male  with past medical history of hypertension, COPD, alcoholic liver cirrhosis, hemosiderosis, CKD 3B, dyslipidemia, prior left lower extremity DVT presenting to [facility] health ED secondary to 3 to 4-day complaint of abdominal pain. He was admitted on 07/05/2023 with acute pancreatitis with septic shock and lactic acidosis, decompensated EtOH cirrhosis complicated by shock liver with associated coagulopathy and thrombocytopenia, acute encephalopathy multifactorial, and others.    He was transferred to [facility] for higher level of care.  He has since had a clinical decompensation.  See significant hospital events below.  Assessment: Principal Problem:   Acute pancreatitis Active Problems:   Alcoholic cirrhosis (HCC)   Shock liver   Need for acute hemodialysis (HCC)   Acute biliary pancreatitis   Coagulopathy (HCC)   Somnolence   Septic shock (HCC)   Elevated liver enzymes   Palliative care by specialist   Goals of care, counseling/discussion   Protein-calorie malnutrition, severe   Concern about end of life  Recommendations/Plan: Continue current supportive interventions. In the event of decline would recommend ongoing discussions with sister regarding full comfort measure transition. Family are still considering transition to full comfort in house Continue DNR-intervene as previously documented Goal is for  patient's discharge to inpatient hospice facility, requesting [facility] in [place] - TOC notified and consult placed No further CRRT PMT will continue to follow and support holistically  Goals of Care and Additional Recommendations: Limitations on Scope of Treatment: Avoid Hospitalization  Code Status:    Code Status Orders  (From admission, onward)           Start     Ordered   07/12/23 1619  Do not attempt resuscitation (DNR) Pre-Arrest Interventions Desired  (Code Status)  Continuous       Question Answer Comment  If pulseless and not breathing No CPR or chest compressions.   In Pre-Arrest Conditions (Patient Has Pulse and Is Breathing) May intubate, use advanced airway interventions and cardioversion/ACLS medications if appropriate or indicated. May transfer to ICU.   Consent: Discussion documented in EHR or advanced directives reviewed      07/12/23 1618           Code Status History     Date Active Date Inactive Code Status Order ID Comments User Context   07/06/2023 2158 07/12/2023 1618 Full Code [id number]  Marylu Lund Inpatient   07/05/2023 1312 07/06/2023 2158 Full Code [id number]  Erick Blinks, DO ED   03/12/2023 0241 03/14/2023 1553 Full Code [id number]  Lilyan Gilford, DO Inpatient   09/23/2022 0352 09/25/2022 1624 Full Code [id number]  Lilyan Gilford, DO ED   05/19/2022 2232 05/24/2022 1733 Full Code [id number]  Zierle-Ghosh, Asia B, DO ED   03/02/2022 1558 03/04/2022 1717 Full Code [id number]  Erick Blinks, DO Inpatient   02/09/2021 2224 02/10/2021 2220 Full Code [id number]  Elgergawy, Leana Roe, MD Inpatient   08/05/2020 1619 08/11/2020 2231 Full Code [id number]  Shon Hale, MD Inpatient   01/06/2013 2358 01/09/2013 1651 Full Code  [id number]  Vania Rea, MD Inpatient       Prognosis:  < 2 weeks  Discharge Planning: Hospice facility  Care plan was discussed with primary RN, patient's sister, Loa Socks, TOC  Thank you  for allowing the Palliative Medicine Team to assist in the care of this patient.   Total Time 50 minutes Prolonged Time Billed  no       Haskel Khan, NP  Please contact Palliative Medicine Team phone at [phone] for questions and concerns.   *Portions of this note are a verbal dictation therefore any spelling and/or grammatical errors are due to the "[employer] One" system interpretation.

## 2023-07-19 NOTE — Progress Notes (Signed)
 Daily Progress Note  DOA: 07/05/2023 Hospital Day: 15   Chief Complaint: Decompensated alcohol related cirrhosis and acute severe pancreatitis  ASSESSMENT    58 year old critically ill gentleman transferred from [facility] with pancreatitis, worsening of chronic liver diease,  shock, respiratory failure requiring intubation and AKI requiring CRRT. Shock had improved with antibiotics and steroids but after discontinuation he had another decline in condition necessitating resumption of pressors.  He incurred worsening renal function and and failed fluid challenge -clinical picture possibly c/w hepatorenal syndrome - was started on CRRT and midodrine which was subsequently discontinued 07/18/2023.  CRRT has been discontinued.  Resumed Levophed.  Septic vr vasodilatory shock / Severe pancreatitis ( Etoh vrs gallstone related).  In setting of severe pancreatitis he could have developed peripancreatic fluid collections which have become infected.  Lipase mildly elevated 211.  Liver tests elevated but not necessarily significant in setting of decompensated cirrhosis. Apparently MRCP wasn't able to be done?  Current renal status precludes ability to perform contrasted study for further imaging.  Decompensated Etoh related cirrhosis with ascites, encephalopathy, coagulopathy.  INR stable at 1.7 post Vit K  Gastric, small bowel and colon wall thickening on non contrast CT scan 3/6.  Probably related to ascites / hypoalbuminemia  AKI on CKD.  CRRT continued. felt to be poor candidate for long term HD.   Mediastinal adenopathy on non-contrast CT scan. Concerning for possible metastasis or lymphoma   Principal Problem:   Acute pancreatitis Active Problems:   Alcoholic cirrhosis (HCC)   Shock liver   Need for acute hemodialysis (HCC)   Acute biliary pancreatitis   Coagulopathy (HCC)   Somnolence   Septic shock (HCC)   Elevated liver enzymes   Palliative care by specialist   Goals of care,  counseling/discussion   Protein-calorie malnutrition, severe   PLAN   --Patient's family is moving towards a palliative perspective.  No further GI tests at this time.  GI will sign off.    Subjective   -- Levophed has been resumed -- Remains off CRRT -- per bedside RN patient's family is moving towards a palliative perspective   Objective    Recent Labs    07/17/23 0400 07/18/23 0415  WBC 23.5* 22.5*  HGB 8.8* 8.6*  HCT 25.5* 25.5*  PLT 147* 201   BMET Recent Labs    07/18/23 0415 07/18/23 1600 07/19/23 0426  NA 134*  135 136 137  K 4.2  4.2 4.1 3.9  CL 104  102 104 105  CO2 23  21* 18* 19*  GLUCOSE 283*  295* 294* 256*  BUN 44*  42* 60* 74*  CREATININE 2.84*  2.77* 3.54* 4.34*  CALCIUM 7.4*  7.6* 7.5* 7.8*   LFT Recent Labs    07/19/23 0426  ALBUMIN 1.8*   PT/INR Recent Labs    07/18/23 0415 07/19/23 0426  LABPROT 20.4* 20.0*  INR 1.7* 1.7*     Imaging:  CT CHEST ABDOMEN PELVIS WO CONTRAST CLINICAL DATA:  Sepsis  EXAM: CT CHEST, ABDOMEN AND PELVIS WITHOUT CONTRAST  TECHNIQUE: Multidetector CT imaging of the chest, abdomen and pelvis was performed following the standard protocol without IV contrast.  RADIATION DOSE REDUCTION: This exam was performed according to the departmental dose-optimization program which includes automated exposure control, adjustment of the mA and/or kV according to patient size and/or use of iterative reconstruction technique.  COMPARISON:  07/07/2023  FINDINGS: CT CHEST FINDINGS  Cardiovascular: Heart is normal size. Aorta is normal  caliber. Moderate coronary artery and aortic atherosclerosis.  Mediastinum/Nodes: Mediastinal adenopathy. Right paratracheal lymph node has a short axis diameter of 3 cm. Subcarinal adenopathy has a short axis diameter of 3.2 cm. Concern for right hilar adenopathy although difficult to evaluate without intravenous contrast. No axillary adenopathy. Trachea and  esophagus are unremarkable. Thyroid unremarkable.  Lungs/Pleura: Small bilateral pleural effusions. Dependent atelectasis in the lower lobes. Mild vascular congestion. Patchy ground-glass opacities anteriorly in the right upper lobe and lingula could reflect early pneumonia.  Musculoskeletal: Bilateral gynecomastia.  No acute bony abnormality.  CT ABDOMEN PELVIS FINDINGS  Hepatobiliary: High-density material noted within the gallbladder could reflect small stones or sludge. No focal hepatic abnormality.  Pancreas: Pancreas appears enlarged. Surrounding edema compatible with acute pancreatitis.  Spleen: No focal abnormality.  Normal size.  Adrenals/Urinary Tract: Adrenal glands normal. Atrophic left kidney. No stones or hydronephrosis bilaterally. Urinary bladder decompressed with Foley catheter in place.  Stomach/Bowel: Scattered colonic diverticulosis. No active diverticulitis. NG tube is in place with the tip in the duodenal bulb. Small bowel decompressed.  Vascular/Lymphatic: Aortic atherosclerosis. No evidence of aneurysm or adenopathy.  Reproductive: No visible focal abnormality.  Other: Moderate ascites in the abdomen and pelvis. No free air. No retroperitoneal hemorrhage.  Musculoskeletal: No acute bony abnormality. Anasarca throughout the subcutaneous soft tissues.  IMPRESSION: Mediastinal adenopathy. This is concerning for possible metastasis or lymphoma.  Coronary artery disease, aortic atherosclerosis.  Small bilateral pleural effusions with dependent atelectasis in the lower lobes. Patchy ground-glass opacities in the upper lobes could reflect pneumonia.  Enlarged edematous pancreas with surrounding inflammation compatible with acute pancreatitis.  Moderate ascites.  Electronically Signed   By: Charlett Nose M.D.   On: 07/14/2023 21:33     Scheduled inpatient medications:   arformoterol  15 mcg Nebulization BID   budesonide (PULMICORT)  nebulizer solution  0.5 mg Nebulization BID   Chlorhexidine Gluconate Cloth  6 each Topical Daily   feeding supplement (PROSource TF20)  60 mL Per Tube QID   folic acid  1 mg Per Tube Daily   insulin aspart  0-15 Units Subcutaneous Q4H   midodrine  10 mg Per Tube Q8H   nicotine  14 mg Transdermal Daily   mouth rinse  15 mL Mouth Rinse 4 times per day   pantoprazole (PROTONIX) IV  40 mg Intravenous Q24H   QUEtiapine  50 mg Per Tube BID   thiamine  100 mg Per Tube Daily   Continuous inpatient infusions:   feeding supplement (VITAL 1.5 CAL) 55 mL/hr at 07/19/23 1500   norepinephrine (LEVOPHED) Adult infusion 6 mcg/min (07/19/23 1500)   piperacillin-tazobactam (ZOSYN)  IV Stopped (07/19/23 1219)   PRN inpatient medications: alteplase, haloperidol lactate, ipratropium-albuterol, [DISCONTINUED] ondansetron **OR** ondansetron (ZOFRAN) IV, mouth rinse, mouth rinse, oxyCODONE  Vital signs in last 24 hours: Temp:  [97.7 F (36.5 C)-99 F (37.2 C)] 98.5 F (36.9 C) (03/20 1500) Pulse Rate:  [114-128] 115 (03/20 1430) Resp:  [2-27] 17 (03/20 1430) BP: (76-116)/(46-77) 106/57 (03/20 1430) SpO2:  [94 %-100 %] 97 % (03/20 1430) Weight:  [86.5 kg] 86.5 kg (03/20 0500) Last BM Date : 07/19/23  Intake/Output Summary (Last 24 hours) at 07/19/2023 1534 Last data filed at 07/19/2023 1500 Gross per 24 hour  Intake 1863.97 ml  Output 1250 ml  Net 613.97 ml    Intake/Output from previous day: 03/19 0701 - 03/20 0700 In: 1771 [I.V.:61; NG/GT:1560; IV Piggyback:149.9] Out: 1195 [Urine:45; Stool:1150] Intake/Output this shift: Total I/O In: 826.9 [I.V.:52; Other:35;  NG/GT:690; IV Piggyback:49.9] Out: 70 [Urine:25; Stool:45]   Physical Exam:  General: Alert confused male in NAD Heart:  Regular rate and rhythm.  Pulmonary: Normal respiratory effort Abdomen: Soft, distended, nontender, tympanitic, quiet bowel sounds.  Psych: Confused but cooperative     LOS: 14 days   Ottie Glazier , MD  07/19/2023, 3:34 PM

## 2023-07-20 DIAGNOSIS — K7031 Alcoholic cirrhosis of liver with ascites: Secondary | ICD-10-CM | POA: Diagnosis not present

## 2023-07-20 DIAGNOSIS — K852 Alcohol induced acute pancreatitis without necrosis or infection: Secondary | ICD-10-CM | POA: Diagnosis not present

## 2023-07-20 DIAGNOSIS — Z515 Encounter for palliative care: Secondary | ICD-10-CM | POA: Diagnosis not present

## 2023-07-20 DIAGNOSIS — Z7189 Other specified counseling: Secondary | ICD-10-CM | POA: Diagnosis not present

## 2023-07-20 LAB — GLUCOSE, CAPILLARY
Glucose-Capillary: 207 mg/dL — ABNORMAL HIGH (ref 70–99)
Glucose-Capillary: 218 mg/dL — ABNORMAL HIGH (ref 70–99)
Glucose-Capillary: 228 mg/dL — ABNORMAL HIGH (ref 70–99)
Glucose-Capillary: 230 mg/dL — ABNORMAL HIGH (ref 70–99)
Glucose-Capillary: 252 mg/dL — ABNORMAL HIGH (ref 70–99)
Glucose-Capillary: 291 mg/dL — ABNORMAL HIGH (ref 70–99)

## 2023-07-20 LAB — PROTIME-INR
INR: 1.7 — ABNORMAL HIGH (ref 0.8–1.2)
Prothrombin Time: 20 s — ABNORMAL HIGH (ref 11.4–15.2)

## 2023-07-20 LAB — PHOSPHORUS: Phosphorus: 3.6 mg/dL (ref 2.5–4.6)

## 2023-07-20 NOTE — Plan of Care (Signed)
   Problem: Education: Goal: Knowledge of General Education information will improve Description Including pain rating scale, medication(s)/side effects and non-pharmacologic comfort measures Outcome: Progressing

## 2023-07-20 NOTE — Plan of Care (Signed)
  Problem: Education: Goal: Knowledge of General Education information will improve Description: Including pain rating scale, medication(s)/side effects and non-pharmacologic comfort measures Outcome: Not Progressing   Problem: Health Behavior/Discharge Planning: Goal: Ability to manage health-related needs will improve Outcome: Not Progressing   Problem: Clinical Measurements: Goal: Ability to maintain clinical measurements within normal limits will improve Outcome: Not Progressing Goal: Will remain free from infection Outcome: Not Progressing Goal: Diagnostic test results will improve Outcome: Not Progressing Goal: Respiratory complications will improve Outcome: Not Progressing Goal: Cardiovascular complication will be avoided Outcome: Not Progressing   Problem: Activity: Goal: Risk for activity intolerance will decrease Outcome: Not Progressing   Problem: Nutrition: Goal: Adequate nutrition will be maintained Outcome: Not Progressing   Problem: Coping: Goal: Level of anxiety will decrease Outcome: Not Progressing   Problem: Elimination: Goal: Will not experience complications related to bowel motility Outcome: Not Progressing Goal: Will not experience complications related to urinary retention Outcome: Not Progressing   Problem: Pain Managment: Goal: General experience of comfort will improve and/or be controlled Outcome: Not Progressing   Problem: Safety: Goal: Ability to remain free from injury will improve Outcome: Not Progressing   Problem: Skin Integrity: Goal: Risk for impaired skin integrity will decrease Outcome: Not Progressing   Problem: Safety: Goal: Non-violent Restraint(s) Outcome: Not Progressing   Problem: Education: Goal: Ability to describe self-care measures that may prevent or decrease complications (Diabetes Survival Skills Education) will improve Outcome: Not Progressing Goal: Individualized Educational Video(s) Outcome: Not  Progressing   Problem: Coping: Goal: Ability to adjust to condition or change in health will improve Outcome: Not Progressing   Problem: Fluid Volume: Goal: Ability to maintain a balanced intake and output will improve Outcome: Not Progressing   Problem: Health Behavior/Discharge Planning: Goal: Ability to identify and utilize available resources and services will improve Outcome: Not Progressing Goal: Ability to manage health-related needs will improve Outcome: Not Progressing   Problem: Metabolic: Goal: Ability to maintain appropriate glucose levels will improve Outcome: Not Progressing   Problem: Nutritional: Goal: Maintenance of adequate nutrition will improve Outcome: Not Progressing Goal: Progress toward achieving an optimal weight will improve Outcome: Not Progressing   Problem: Skin Integrity: Goal: Risk for impaired skin integrity will decrease Outcome: Not Progressing   Problem: Tissue Perfusion: Goal: Adequacy of tissue perfusion will improve Outcome: Not Progressing

## 2023-07-20 NOTE — Progress Notes (Signed)
 Nutrition Brief Note  Patient discussed in IDT rounds.  He self removed Cortrak tube this morning.  No current plans to replaces as  plan is an eventual transition to comfort care and possible home hospice. Awaiting family and palliative care meeting later today.   Will continue to monitor for nutrition needs and GOC.  Please reach out in the meantime via secure chat if immediate nutrition needs should arise.   Drusilla Kanner, RDN, LDN Clinical Nutrition See AMi[facility] for contact information.

## 2023-07-20 NOTE — Progress Notes (Addendum)
 NAME:  [name] [name], MRN:  [id number], DOB:  [birth year], LOS: 15 ADMISSION DATE:  07/05/2023, CONSULTATION DATE:  07/06/2023 REFERRING MD:  Randye Lobo, CHIEF COMPLAINT:  Abdominal Pain   History of Present Illness:  [name] [name] is a 58 year old gentleman with a known medical history significant for hypertension, COPD, alcoholic liver cirrhosis, hemosiderosis, CKD 3B, dyslipidemia, prior left lower extremity DVT presenting to [facility] health ED secondary to 3 to 4-day complaint of abdominal pain.  Patient describes bilateral flank type pain increasing in intensity without radiation to groin chest or back.  He states this is similar pain to his previous admission for pancreatitis.  Patient does elicit nausea and vomiting and diarrhea.   It is noted that the patient does have history of cholelithiasis and decompensated cirrhosis.  Patient reports he has not had any alcohol since his birthday on [birth year], however according to GI note he made comments that his last drink was on this past Sunday.  CT of the abdomen pelvis without contrast performed on July 05, 2023 reveals atrophic left kidney, gallstones and diffuse pancreatitis  Abdomen Limited ultrasound performed today 07/06/2023 reveals no significant abdominal ascites.  Laboratory indices: He does have elevation in coagulation studies with a PT of 26 on yesterday that elevated to 50.4 today 3 10/31/2023 and INR at admission yesterday 2.4 elevated to 5.5 today.  CBC: WBC 15.3, hemoglobin 11, hematocrit 36.7, platelets 164.  BMP sodium 132, potassium 5.8, chloride 99, CO2 14 BUN 40, creatinine 4.81 and glucose 214.  LFTs: Lipase 2929, AST 5021, ALT 05/08/2007.  Secondary to patient's known CKD presenting now with AKI and presence of oliguria a temporary HD line was placed by surgery team today for hemodialysis.  Patient underwent 2-hour HD in the ED prior to transfer.  Patient did receive IV fluids for pancreatitis management, as he is a poor candidate for  liver transplant due to ongoing alcohol use per GI note.  Consults: Patient has been followed by nephrology and gastrointestinal services Blood cultures sent 07/06/2023 secondary to leukocytosis.  As such patient was transferred to [facility] for ICU monitoring and management and admitted with acute on chronic liver failure/pancreatitis.  Pertinent  Medical History  Asthma, B12 deficiency, cirrhosis, dyspnea, hyperlipidemia, history of kidney stones, hypertension, kidney stones, COPD, prior lower left extremity DVT, chronic anemia.  Significant Hospital Events: Including procedures, antibiotic start and stop dates in addition to other pertinent events   3/7 Hemodialysis  (temporary HD line placed) s/p short HD, tx to [facility] 3/8 worsening shock, pressors, CTH/ CT a/p 3/9 intubated. On 3 pressors. Renal fxn and lactic acidosis worse. Started CRRT 3/10 LFTs a little better. Still on 3 pressors. Comfortable efforts on PSV but mental status not amendable to extubation. ECHO EF 55% no WMA, grade I diastolic dysfxn, started stress dose steroids. Right internal jugular CVL placed. The epi was weaned off over course of am after stress dose steroids started, NE weaning stopped fent gtt changed to PRN. D10 rate reduced to 10cc/hr. Acetylcysteine completed  3/11 vasopressin discontinued on very low-dose norepinephrine starting to wean sedation further to assess for readiness to wean ventilation but not responding and appeared to have left gaze pref after sedation off for prolonged time. CT head obtained no acute findings. Abx switched to zosyn 3/12 woke up during evening. Agitated at times. Attempting to get OOB. Still seeming to have left sided weakness but exam much closer to that of the 10th. Norepi off.  Extubated.  Taken off  CRRT, encephalopathic but tolerated extubation 3/13 agitated during the evening hours, hypertensive and tachycardic moaning.  Started on Precedex.  This was discontinued on a.m. rounds.   Remains off pressors remains off dialysis minimal urine output.  Had a goals of care discussion with palliative and critical care.  Critical care recommending DO NOT RESUSCITATE, also shared with family patient's appropriateness for hospice going forward.  Family taking CODE STATUS into advisement with plan to follow-up 3/14 Patient was agitated overnight, pulled out his core track. Off vasopressors 3/15 Back on pressors including levo and vaso, likely secondary to volume depletion given improvement with albumin but antibiotics and steroids also resumed 3/16 nephrology evaluated and decision made to start back CRRT.  Repeat CT chest abdomen pelvis with ongoing solid pancreatitis and mediastinal adenopathy 3/17 Some agitation overnight, back on pressors this morning 3/19 remains off CRRT, pending goals of care discussion 3/20 remains on Levophed at 7, off CRRT.  Awaiting formal decision per family for goals of care  Interim History / Subjective:  No acute events overnight Remains on pressors CRRT off GOC/PMT discussion ongoing.   Objective   Blood pressure 101/66, pulse (!) 118, temperature 98.2 F (36.8 C), temperature source Oral, resp. rate (!) 27, height 6' (1.829 m), weight 86.5 kg, SpO2 98%.        Intake/Output Summary (Last 24 hours) at 07/20/2023 0751 Last data filed at 07/20/2023 0600 Gross per 24 hour  Intake 1931.36 ml  Output 370 ml  Net 1561.36 ml   Filed Weights   07/15/23 0308 07/17/23 0343 07/19/23 0500  Weight: 84 kg 83 kg 86.5 kg    Examination:  General: Middle age chronically ill appearing male in NAD HEENT: [place]/AT, MM pink/moist, PERRL Neuro: Awake, alert, cooperates with exam.  CV: RRR, no MRG PULM: Clear bilateral breath sounds GI: distended, ascites. Non-tender.  Extremities: No acute deformity. Generalized anasarca.  Skin: no rashes or lesions.   Resolved Hospital Problem list   Lactic acid Acute hypoxic respiratory failure resolved extubated  3/12 Severe sepsis with septic shock, POA now resolved Aspiration pneumonia -Completed 7 days of IV Zosyn 3/14  Assessment & Plan:   Septic versus vasodilatory shock -culture-negative  -Initially resolved, back on pressors 3/15-3/16, now again 3/18 Some concern regarding pancreatic fluid collection but renal function has been the limiting factor for additional imaging. Completed 3 days merrem then 7 days zosyn 3/17. P: Remains on low-dose pressors for MAP goal greater than 65 Continue midodrine Appreciate PMT consultants. Planning for inpatient hospice care once family arrives from [place]. Will continue supportive care until that time.  Zosyn course finishes today.   Acute on chronic pancreatitis secondary to gallstone versus alcohol use -Lipase downtrending but remains elevated -Repeat chest abdomen pelvis 3/15 with persistent mediastinal adenopathy and enlarged edematous pancreas with surrounding inflammation compatible with persistent acute pancreatitis History of pancreatic divisum on MRCP Acute on chronic liver failure Alcoholic cirrhosis -MELD 35 Coagulopathy of liver disease P: Appreciate GIs assistance. Signed off 3/20   Dialysis dependent anuric AKI superimposed on CKD stage IIIb likely secondary to ischemic ATN -CRRT utilized 3/9 to 3/12 -CRRT resumed 3/16 P: Nephrology signed off 3/20 Not a candidate for HD  Thrombocytopenia of liver disease and critical illness anemia of chronic illness, improving P: Trend CBC Transfuse per protocol Hemoglobin goal greater than 7  Acute encephalopathy, combined metabolic and hepatic, improving P: Supportive care Remains on Seroquel  Recurrent hypoglycemia now with progressively hyperglycemia P: Continue moderate scale SSI CBG goal  140-180 CBG checks every 4  Severe protein calorie malnutrition P: Tube feeds  Goals of care  DNR Continuing current supportive measures Goal per family/PMT discussion is for discharge  to hospice facility In house comfort care might be more reasonable given his current pressor requirment Discussions ongoing.  Need to address intubation status.     Best Practice (right click and "Reselect all SmartList Selections" daily)   Diet: Cotrak ,tube feeds DVT prophylaxis prophylactic heparin  Pressure ulcer(s): N/A GI prophylaxis: PPI Lines: central line.  Femoral HD catheter  Foley: Discontinue Code Status: DNR Last date of multidisciplinary goals of care discussion [palliative care is following]   Critical care time:  30 min  Joneen Roach, AGACNP-BC [facility]  See Amion for personal pager PCCM on call pager [phone] until 7pm. Please call Elink 7p-7a. [phone]  07/20/2023 8:05 AM

## 2023-07-21 DIAGNOSIS — K852 Alcohol induced acute pancreatitis without necrosis or infection: Secondary | ICD-10-CM | POA: Diagnosis not present

## 2023-07-21 DIAGNOSIS — K7031 Alcoholic cirrhosis of liver with ascites: Secondary | ICD-10-CM | POA: Diagnosis not present

## 2023-07-21 DIAGNOSIS — Z7189 Other specified counseling: Secondary | ICD-10-CM | POA: Diagnosis not present

## 2023-07-21 DIAGNOSIS — Z515 Encounter for palliative care: Secondary | ICD-10-CM | POA: Diagnosis not present

## 2023-07-21 LAB — CBC
HCT: 25 % — ABNORMAL LOW (ref 39.0–52.0)
Hemoglobin: 8.6 g/dL — ABNORMAL LOW (ref 13.0–17.0)
MCH: 30 pg (ref 26.0–34.0)
MCHC: 34.4 g/dL (ref 30.0–36.0)
MCV: 87.1 fL (ref 80.0–100.0)
Platelets: 268 10*3/uL (ref 150–400)
RBC: 2.87 MIL/uL — ABNORMAL LOW (ref 4.22–5.81)
RDW: 19.5 % — ABNORMAL HIGH (ref 11.5–15.5)
WBC: 21.6 10*3/uL — ABNORMAL HIGH (ref 4.0–10.5)
nRBC: 0.1 % (ref 0.0–0.2)

## 2023-07-21 LAB — BASIC METABOLIC PANEL
Anion gap: 16 — ABNORMAL HIGH (ref 5–15)
BUN: 122 mg/dL — ABNORMAL HIGH (ref 6–20)
CO2: 16 mmol/L — ABNORMAL LOW (ref 22–32)
Calcium: 8.5 mg/dL — ABNORMAL LOW (ref 8.9–10.3)
Chloride: 103 mmol/L (ref 98–111)
Creatinine, Ser: 6.36 mg/dL — ABNORMAL HIGH (ref 0.61–1.24)
GFR, Estimated: 9 mL/min — ABNORMAL LOW (ref >60)
Glucose, Bld: 206 mg/dL — ABNORMAL HIGH (ref 70–99)
Potassium: 4.9 mmol/L (ref 3.5–5.1)
Sodium: 135 mmol/L (ref 135–145)

## 2023-07-21 LAB — MAGNESIUM: Magnesium: 2.4 mg/dL (ref 1.7–2.4)

## 2023-07-21 LAB — PROTIME-INR
INR: 1.7 — ABNORMAL HIGH (ref 0.8–1.2)
Prothrombin Time: 20.2 s — ABNORMAL HIGH (ref 11.4–15.2)

## 2023-07-21 LAB — GLUCOSE, CAPILLARY
Glucose-Capillary: 109 mg/dL — ABNORMAL HIGH (ref 70–99)
Glucose-Capillary: 114 mg/dL — ABNORMAL HIGH (ref 70–99)
Glucose-Capillary: 117 mg/dL — ABNORMAL HIGH (ref 70–99)
Glucose-Capillary: 159 mg/dL — ABNORMAL HIGH (ref 70–99)
Glucose-Capillary: 168 mg/dL — ABNORMAL HIGH (ref 70–99)
Glucose-Capillary: 90 mg/dL (ref 70–99)

## 2023-07-21 LAB — PHOSPHORUS: Phosphorus: 4.6 mg/dL (ref 2.5–4.6)

## 2023-07-21 NOTE — Plan of Care (Signed)
  Problem: Clinical Measurements: Goal: Respiratory complications will improve Outcome: Progressing   Problem: Activity: Goal: Risk for activity intolerance will decrease Outcome: Not Progressing   Problem: Coping: Goal: Level of anxiety will decrease Outcome: Progressing   Problem: Elimination: Goal: Will not experience complications related to urinary retention Outcome: Not Progressing   Problem: Pain Managment: Goal: General experience of comfort will improve and/or be controlled Outcome: Progressing   Problem: Safety: Goal: Ability to remain free from injury will improve Outcome: Progressing

## 2023-07-21 NOTE — Progress Notes (Signed)
 NAME:  [name] [name], MRN:  [id number], DOB:  1966/01/08, LOS: 16 ADMISSION DATE:  07/05/2023, CONSULTATION DATE:  07/06/2023 REFERRING MD:  Randye Lobo, CHIEF COMPLAINT:  Abdominal Pain   History of Present Illness:  [name] is a 58 year old gentleman with a known medical history significant for hypertension, COPD, alcoholic liver cirrhosis, hemosiderosis, CKD 3B, dyslipidemia, prior left lower extremity DVT presenting to [facility] health ED secondary to 3 to 4-day complaint of abdominal pain.  Patient describes bilateral flank type pain increasing in intensity without radiation to groin chest or back.  He states this is similar pain to his previous admission for pancreatitis.  Patient does elicit nausea and vomiting and diarrhea.   It is noted that the patient does have history of cholelithiasis and decompensated cirrhosis.  Patient reports he has not had any alcohol since his birthday on [birth year], however according to GI note he made comments that his last drink was on this past Sunday.  CT of the abdomen pelvis without contrast performed on July 05, 2023 reveals atrophic left kidney, gallstones and diffuse pancreatitis  Abdomen Limited ultrasound performed today 07/06/2023 reveals no significant abdominal ascites.  Laboratory indices: He does have elevation in coagulation studies with a PT of 26 on yesterday that elevated to 50.4 today 3 10/31/2023 and INR at admission yesterday 2.4 elevated to 5.5 today.  CBC: WBC 15.3, hemoglobin 11, hematocrit 36.7, platelets 164.  BMP sodium 132, potassium 5.8, chloride 99, CO2 14 BUN 40, creatinine 4.81 and glucose 214.  LFTs: Lipase 2929, AST 5021, ALT 05/08/2007.  Secondary to patient's known CKD presenting now with AKI and presence of oliguria a temporary HD line was placed by surgery team today for hemodialysis.  Patient underwent 2-hour HD in the ED prior to transfer.  Patient did receive IV fluids for pancreatitis management, as he is a poor candidate for  liver transplant due to ongoing alcohol use per GI note.  Consults: Patient has been followed by nephrology and gastrointestinal services Blood cultures sent 07/06/2023 secondary to leukocytosis.  As such patient was transferred to [facility] for ICU monitoring and management and admitted with acute on chronic liver failure/pancreatitis.  Pertinent  Medical History  Asthma, B12 deficiency, cirrhosis, dyspnea, hyperlipidemia, history of kidney stones, hypertension, kidney stones, COPD, prior lower left extremity DVT, chronic anemia.  Significant Hospital Events: Including procedures, antibiotic start and stop dates in addition to other pertinent events   3/7 Hemodialysis  (temporary HD line placed) s/p short HD, tx to [facility] 3/8 worsening shock, pressors, CTH/ CT a/p 3/9 intubated. On 3 pressors. Renal fxn and lactic acidosis worse. Started CRRT 3/10 LFTs a little better. Still on 3 pressors. Comfortable efforts on PSV but mental status not amendable to extubation. ECHO EF 55% no WMA, grade I diastolic dysfxn, started stress dose steroids. Right internal jugular CVL placed. The epi was weaned off over course of am after stress dose steroids started, NE weaning stopped fent gtt changed to PRN. D10 rate reduced to 10cc/hr. Acetylcysteine completed  3/11 vasopressin discontinued on very low-dose norepinephrine starting to wean sedation further to assess for readiness to wean ventilation but not responding and appeared to have left gaze pref after sedation off for prolonged time. CT head obtained no acute findings. Abx switched to zosyn 3/12 woke up during evening. Agitated at times. Attempting to get OOB. Still seeming to have left sided weakness but exam much closer to that of the 10th. Norepi off.  Extubated.  Taken off  CRRT, encephalopathic but tolerated extubation 3/13 agitated during the evening hours, hypertensive and tachycardic moaning.  Started on Precedex.  This was discontinued on a.m. rounds.   Remains off pressors remains off dialysis minimal urine output.  Had a goals of care discussion with palliative and critical care.  Critical care recommending DO NOT RESUSCITATE, also shared with family patient's appropriateness for hospice going forward.  Family taking CODE STATUS into advisement with plan to follow-up 3/14 Patient was agitated overnight, pulled out his core track. Off vasopressors 3/15 Back on pressors including levo and vaso, likely secondary to volume depletion given improvement with albumin but antibiotics and steroids also resumed 3/16 nephrology evaluated and decision made to start back CRRT.  Repeat CT chest abdomen pelvis with ongoing solid pancreatitis and mediastinal adenopathy 3/17 Some agitation overnight, back on pressors this morning 3/19 remains off CRRT, pending goals of care discussion 3/20 remains on Levophed at 7, off CRRT.  Awaiting formal decision per family for goals of care 3/21 completed abx 3/22 no distress renal fxn worse  Interim History / Subjective:   No distress  Objective   Blood pressure (!) 110/53, pulse (!) 119, temperature 98.1 F (36.7 C), temperature source Oral, resp. rate (!) 27, height 6' (1.829 m), weight 86.5 kg, SpO2 98%.        Intake/Output Summary (Last 24 hours) at 07/21/2023 0951 Last data filed at 07/21/2023 0400 Gross per 24 hour  Intake 388.25 ml  Output 345 ml  Net 43.25 ml   Filed Weights   07/15/23 0308 07/17/23 0343 07/19/23 0500  Weight: 84 kg 83 kg 86.5 kg    Examination:  General: Chronically ill-appearing 58 year old male patient lying in bed no acute distress HEENT normocephalic atraumatic sclera nonicteric mucous membranes moist Pulmonary: Clear diminished bases Cardiac: Regular rate and rhythm Abdomen: Soft denies tenderness still has liquid stools Extremities warm dry dependent edema Neuro: Awake, oriented x 2 moves all extremities but diffusely weak GU: Minimal urine output  Resolved Hospital  Problem list   Lactic acid Acute hypoxic respiratory failure resolved extubated 3/12 Severe sepsis with septic shock, POA now resolved Aspiration pneumonia -Completed 7 days of IV Zosyn 3/14 Thrombocytopenia  Assessment & Plan:   Persistent vasodilatory shock -culture-negative  -Initially resolved, back on pressors 3/15-3/16, now again 3/18 Some concern regarding pancreatic fluid collection but not able to image w/ renal fxn P: Remains on low-dose pressors for MAP goal greater than 65 Continue midodrine Will not add rx  Abx completed   Acute on chronic pancreatitis secondary to gallstone versus alcohol use repeat ct abd 15th c/w on-going pancreatitis  History of pancreatic divisum on MRCP Acute on chronic liver failure Alcoholic cirrhosis-MELD 35 Coagulopathy of liver disease P: Appreciate GIs assistance. Signed off 3/20  Nothing to add will let him eat if he wants   Dialysis dependent anuric AKI superimposed on CKD stage IIIb likely secondary to ischemic ATN -CRRT utilized 3/9 to 3/12 -CRRT resumed 3/16 P: Renal fxn worse, nothing to add.   anemia of chronic illness, stable P: PRN CBC Given likelihood to transition to palliative focus soon I do not think further transfusions are appropriate   Acute encephalopathy, combined metabolic and hepatic, improving P: Supportive care Remains on Seroquel  Recurrent hypoglycemia now with progressively hyperglycemia P: Continue moderate scale SSI CBG goal 140-180  Severe protein calorie malnutrition P: Tube feeds  Goals of care  DNR No escalation  Awaiting sisters arrival from [place] anticipated today or tomorrow  He  will not survive his progressive renal failure   Best Practice (right click and "Reselect all SmartList Selections" daily)   Diet: adv as able  DVT prophylaxis prophylactic heparin  Pressure ulcer(s): N/A GI prophylaxis: PPI Lines: central line.  Femoral HD catheter  Foley: Discontinue Code Status:  DNR Last date of multidisciplinary goals of care discussion [palliative care is following]  My cct 32 min   07/21/2023 9:51 AM

## 2023-07-21 NOTE — Progress Notes (Signed)
 Palliative Medicine Progress Note   Patient Name: [name] [name]       Date: 07/21/2023 DOB: 1965-11-13  Age: 58 y.o. MRN#: [id number] Attending Physician: Lynnell Catalan, MD Primary Care Physician: Benetta Spar, MD Admit Date: 07/05/2023   HPI/Patient Profile: 58 y.o. male  with past medical history of hypertension, COPD, alcoholic liver cirrhosis, hemosiderosis, CKD 3B, dyslipidemia, prior left lower extremity DVT presenting to [facility] ED with complaint of abdominal pain. He was admitted on 07/05/2023 with acute pancreatitis with septic shock and lactic acidosis, decompensated EtOH cirrhosis complicated by shock liver with associated coagulopathy and thrombocytopenia, acute encephalopathy, and others.    Patient transferred to [facility] on 07/06/23 for higher level of care.  See significant hospital events below. Intubated 3/9, extubated 3/12. CRRT 3/9 - 3/12 and 3/16 - 3/18  He has remained hypotensive, and has been on and off pressors.  He did not respond to fluid or Lasix challenge.  He has remained encephalopathic and confused.  Per nephrology, he is not a candidate for long-term HD.   Palliative medicine was consulted for GOC conversations. Family meeting with PMT and PCCM on 3/12; discussed  patient's advanced liver disease and overall poor prognosis. Code status changed to DNR on 3/13. Another family meeting held on 3/19; discussed possible transition to comfort care.    Subjective: Chart reviewed including vital signs, labs, and progress notes. Update received from RN. Discussed with [name] Roach NP.   Patient assessed at bedside. He is more alert today, oriented to person and place. However, he remains acutely and chronically ill-appearing. He denies pain or other acute complaints.    I spoke with his sister/[name] by phone. I provided updates on patient's condition as per above. She reports that family is not ready to transition to comfort care at this time, expressing concern that patient's prognosis will be quite limited when pressors are discontinued. She reports she is traveling from [place] and will arrive tomorrow. She is still interested in transfer to inpatient hospice facility in [place], but does not want this transfer to take place until she arrives.     Objective:  Physical Exam Vitals reviewed.  Constitutional:      General: He is awake. He is not in acute distress.    Appearance: He is ill-appearing.  Pulmonary:  Effort: Pulmonary effort is normal.  Abdominal:     General: There is distension.  Neurological:     Motor: Weakness present.     Comments: Oriented to self and place             Palliative Medicine Assessment & Plan   Assessment: Principal Problem:   Acute pancreatitis Active Problems:   Alcoholic cirrhosis (HCC)   Shock liver   Need for acute hemodialysis (HCC)   Acute biliary pancreatitis   Coagulopathy (HCC)   Somnolence   Septic shock (HCC)   Elevated liver enzymes   Palliative care by specialist   Goals of care, counseling/discussion   Protein-calorie malnutrition, severe    Recommendations/Plan: Continue current supportive interventions Sister does not want transition to comfort or transfer to hospice facility until she arrives from [place] (likely Saturday) No further CRRT Goal is for discharge to hospice facility in [place], if patient can remain reasonably stable enough for transfer PMT will follow-up tomorrow   Code Status: DNR - interventions   Prognosis:  poor  Discharge Planning: To Be Determined   Thank you for allowing the Palliative Medicine Team to assist in the care of this patient.   Time: 36 minutes   Merry Proud, NP   Please contact Palliative Medicine Team phone  at [phone] for questions and concerns.  For individual providers, please see AMION.

## 2023-07-21 NOTE — Progress Notes (Signed)
 Palliative Medicine Progress Note   Patient Name: [name] [name]       Date: 07/21/2023 DOB: Sep 15, 1965  Age: 58 y.o. MRN#: [id number] Attending Physician: Lynnell Catalan, MD Primary Care Physician: Benetta Spar, MD Admit Date: 07/05/2023   HPI/Patient Profile: 58 y.o. male  with past medical history of hypertension, COPD, alcoholic liver cirrhosis, hemosiderosis, CKD 3B, dyslipidemia, prior left lower extremity DVT presenting to [facility] ED with complaint of abdominal pain. He was admitted on 07/05/2023 with acute pancreatitis with septic shock and lactic acidosis, decompensated EtOH cirrhosis complicated by shock liver with associated coagulopathy and thrombocytopenia, acute encephalopathy, and others.    Patient transferred to [facility] on 07/06/23 for higher level of care.  See significant hospital events below. Intubated 3/9, extubated 3/12. CRRT 3/9 - 3/12 and 3/16 - 3/18   He has remained hypotensive, and has been on and off pressors.  He did not respond to fluid or Lasix challenge.  He has remained encephalopathic and confused.  Per nephrology, he is not a candidate for long-term HD.   Palliative medicine was consulted for GOC conversations. Family meeting with PMT and PCCM on 3/12; discussed  patient's advanced liver disease and overall poor prognosis. Code status changed to DNR on 3/13. Another family meeting held on 3/19; discussed possible transition to comfort care.   Subjective: Chart reviewed. Update received from RN. Patient assessed at bedside. He is on norepinephrine at 20 mcg. BP has been as low as 70's/40's today.  I met with sister/[name] [name] and aunt/[name] [name] in the 76M waiting room. Provided updates on patient's condition as per above.  We discussed the limitations of medical  interventions to prolong quality of life when the body fails to thrive.  Family understands that patient is approaching end-of-life  despite current supportive interventions, in the setting of progressive liver and kidney failure.    Discussed that patient is likely not going to be stable enough for transfer to inpatient hospice facility in [place]. Reviewed the concept of a comfort path, emphasizing this means de-escalating full scope medical interventions and allowing a natural course to occur. Discussed that the goal is comfort and dignity rather than prolonging life.   Family wants to wait until tomorrow to make final decisions.  If a bed becomes available at the hospice facility in [place], they will  consider transfer but only if patient seems stable for said transfer.  Otherwise, they are agreeable to transition to comfort care in-house.    Objective:  Physical Exam Vitals reviewed.  Constitutional:      General: He is awake. He is not in acute distress.    Appearance: He is ill-appearing.  Pulmonary:     Effort: Pulmonary effort is normal.  Abdominal:     General: There is distension.  Neurological:     Mental Status: He is confused.               Palliative Medicine Assessment & Plan   Assessment: Principal Problem:   Acute pancreatitis Active Problems:   Alcoholic cirrhosis (HCC)   Shock liver   Need for acute hemodialysis (HCC)   Acute biliary pancreatitis   Coagulopathy (HCC)   Somnolence   Septic shock (HCC)   Elevated liver enzymes   Palliative care by specialist   Goals of care, counseling/discussion   Protein-calorie malnutrition, severe    Recommendations/Plan: Continue current supportive interventions Family will consider transfer to hospice facility in [place] (if bed is available tomorrow and if patient is stable for transport)  Otherwise, plan for transition to comfort care in-house tomorrow PMT will follow-up tomorrow  Code Status: DNR -  interventions   Prognosis: poor  Discharge Planning: To Be Determined  Care plan was discussed with PCCM, RN  Thank you for allowing the Palliative Medicine Team to assist in the care of this patient.   Time: 52 minutes   Merry Proud, NP   Please contact Palliative Medicine Team phone at [phone] for questions and concerns.  For individual providers, please see AMION.

## 2023-07-22 DIAGNOSIS — Z7189 Other specified counseling: Secondary | ICD-10-CM | POA: Diagnosis not present

## 2023-07-22 DIAGNOSIS — Z515 Encounter for palliative care: Secondary | ICD-10-CM | POA: Diagnosis not present

## 2023-07-22 DIAGNOSIS — K852 Alcohol induced acute pancreatitis without necrosis or infection: Secondary | ICD-10-CM | POA: Diagnosis not present

## 2023-07-22 DIAGNOSIS — K7031 Alcoholic cirrhosis of liver with ascites: Secondary | ICD-10-CM | POA: Diagnosis not present

## 2023-07-22 LAB — GLUCOSE, CAPILLARY
Glucose-Capillary: 113 mg/dL — ABNORMAL HIGH (ref 70–99)
Glucose-Capillary: 125 mg/dL — ABNORMAL HIGH (ref 70–99)
Glucose-Capillary: 25 mg/dL — CL (ref 70–99)
Glucose-Capillary: 122 mg/dL — ABNORMAL HIGH (ref 70–99)
Glucose-Capillary: 111 mg/dL — ABNORMAL HIGH (ref 70–99)
Glucose-Capillary: 23 mg/dL — CL (ref 70–99)

## 2023-07-22 LAB — BASIC METABOLIC PANEL
Anion gap: 18 — ABNORMAL HIGH (ref 5–15)
BUN: 139 mg/dL — ABNORMAL HIGH (ref 6–20)
CO2: 15 mmol/L — ABNORMAL LOW (ref 22–32)
Calcium: 8.7 mg/dL — ABNORMAL LOW (ref 8.9–10.3)
Chloride: 101 mmol/L (ref 98–111)
Creatinine, Ser: 7.54 mg/dL — ABNORMAL HIGH (ref 0.61–1.24)
GFR, Estimated: 8 mL/min — ABNORMAL LOW (ref >60)
Glucose, Bld: 139 mg/dL — ABNORMAL HIGH (ref 70–99)
Potassium: 5.6 mmol/L — ABNORMAL HIGH (ref 3.5–5.1)
Sodium: 134 mmol/L — ABNORMAL LOW (ref 135–145)

## 2023-07-22 LAB — PROTIME-INR
INR: 1.7 — ABNORMAL HIGH (ref 0.8–1.2)
Prothrombin Time: 20.3 s — ABNORMAL HIGH (ref 11.4–15.2)

## 2023-07-22 LAB — PHOSPHORUS: Phosphorus: 6.3 mg/dL — ABNORMAL HIGH (ref 2.5–4.6)

## 2023-07-22 MED ORDER — HYDROMORPHONE HCL 1 MG/ML IJ SOLN
0.5000 mg | INTRAMUSCULAR | Status: DC | PRN
  Administered 2023-07-22: 2 mg via INTRAVENOUS
  Filled 2023-07-22: qty 2

## 2023-07-22 MED ORDER — POLYVINYL ALCOHOL 1.4 % OP SOLN: 1.0000 [drp] | Freq: Four times a day (QID) | OPHTHALMIC | Status: DC | PRN

## 2023-07-22 MED ORDER — LORAZEPAM 2 MG/ML IJ SOLN
1.0000 mg | INTRAMUSCULAR | Status: DC | PRN
  Administered 2023-07-22: 1 mg via INTRAVENOUS
  Filled 2023-07-22: qty 1

## 2023-07-22 MED ORDER — ACETAMINOPHEN 325 MG PO TABS: 650.0000 mg | ORAL_TABLET | Freq: Four times a day (QID) | ORAL | Status: DC | PRN

## 2023-07-22 MED ORDER — BIOTENE DRY MOUTH MT LIQD: 15.0000 mL | OROMUCOSAL | Status: DC | PRN

## 2023-07-22 MED ORDER — GLYCOPYRROLATE 0.2 MG/ML IJ SOLN
0.2000 mg | INTRAMUSCULAR | Status: DC | PRN
  Administered 2023-07-23: 0.2 mg via INTRAVENOUS
  Filled 2023-07-22: qty 1

## 2023-07-22 MED ORDER — ACETAMINOPHEN 650 MG RE SUPP: 650.0000 mg | Freq: Four times a day (QID) | RECTAL | Status: DC | PRN

## 2023-07-22 NOTE — TOC Progression Note (Signed)
 Transition of Care [facility]) - Initial/Assessment Note    Patient Details  Name: [name] [name] MRN: [id number] Date of Birth: [birth year]  Transition [facility]) CM/SW Contact:    Ralene Bathe, LCSW Phone Number: 07/22/2023, 12:22 PM  Clinical Narrative:                 CSW contacted [facility] and spoke with Crystal to ask if a bed was available today.   CSW was encouraged to call tomorrow to check on the status of referral.  TOC will continue to follow.     Barriers to Discharge: Continued Medical Work up   Patient Goals and CMS Choice            Expected Discharge Plan and Services       Living arrangements for the past 2 months: Single Family Home                                      Prior Living Arrangements/Services Living arrangements for the past 2 months: Single Family Home Lives with:: Significant Other                   Activities of Daily Living   ADL Screening (condition at time of admission) Independently performs ADLs?: Yes (appropriate for developmental age) Is the patient deaf or have difficulty hearing?: No Does the patient have difficulty seeing, even when wearing glasses/contacts?: No Does the patient have difficulty concentrating, remembering, or making decisions?: No  Permission Sought/Granted                  Emotional Assessment              Admission diagnosis:  Acute pancreatitis [K85.90] Generalized abdominal pain [R10.84] Acute liver failure [K72.00] Alcohol-induced acute pancreatitis, unspecified complication status [K85.20] Acute biliary pancreatitis [K85.10] Patient Active Problem List   Diagnosis Date Noted   Protein-calorie malnutrition, severe 07/12/2023   Palliative care by specialist 07/10/2023   Goals of care, counseling/discussion 07/10/2023   Elevated liver enzymes 07/08/2023   Coagulopathy (HCC) 07/07/2023   Somnolence 07/07/2023   Septic shock (HCC) 07/07/2023   Shock liver 07/06/2023    Need for acute hemodialysis (HCC) 07/06/2023   Acute biliary pancreatitis 07/06/2023   Dog bite 03/12/2023   Acute pancreatitis 03/11/2023   Metabolic acidosis 09/25/2022   Bladder outlet obstruction 09/25/2022   Acute pancreatitis without infection or necrosis 05/24/2022   Alcohol-induced acute pancreatitis 05/23/2022   Cholelithiasis 05/20/2022   Tobacco use disorder 05/20/2022   GERD (gastroesophageal reflux disease) 05/20/2022   B12 deficiency 03/15/2022   History of colonic polyps 03/09/2022   Gallbladder polyp 03/01/2022   Alcohol abuse 11/28/2021   Upper abdominal pain 08/11/2021   AKI (acute kidney injury) (HCC) 08/11/2021   Portal hypertension (HCC) 08/03/2021   Other ascites 08/03/2021   Moderate protein-calorie malnutrition (HCC) 08/03/2021   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   Elevated ferritin 08/26/2020   Hypomagnesemia 08/11/2020   Hypoalbuminemia 08/11/2020   Hypotension 08/11/2020   Anemia of chronic disease 08/11/2020   DVT (deep venous thrombosis) -Lt LL Extensive DVT 08/05/2020   Alcoholic cirrhosis (HCC) 08/05/2020   COPD (chronic obstructive pulmonary disease) (HCC) 08/05/2020   Hypokalemia 08/05/2020   Cirrhosis of liver without ascites (HCC) 08/04/2020   Edema of left lower extremity 08/04/2020   Loss of weight 07/07/2020   Loss of appetite  07/07/2020   Anasarca 07/07/2020   Heme + stool 01/22/2017   Abnormal LFTs 01/22/2017   Hyperlipidemia 02/08/2015   Cigarette nicotine dependence with nicotine-induced disorder 02/08/2015   Leukocytosis 01/07/2013   Hyponatremia 01/07/2013   UTI (urinary tract infection) 01/06/2013   Kidney stone 01/06/2013   Ureteral colic 01/06/2013   PCP:  Benetta Spar, MD Pharmacy:   [facility] [phone] - [place], [place] - [address] [place] [address] Phone: [phone] Fax: [phone]     Social Drivers  of Health (SDOH) Social History: SDOH Screenings   Food Insecurity: No Food Insecurity (07/06/2023)  Housing: Low Risk  (07/06/2023)  Transportation Needs: No Transportation Needs (07/06/2023)  Utilities: Not At Risk (07/06/2023)  Alcohol Screen: Low Risk  (09/13/2020)  Financial Resource Strain: High Risk (11/28/2021)   Received from [facility], [facility]  Physical Activity: Sufficiently Active (09/13/2020)  Social Connections: Moderately Isolated (09/13/2020)  Stress: No Stress Concern Present (09/13/2020)  Tobacco Use: High Risk (07/05/2023)   SDOH Interventions:     Readmission Risk Interventions    03/13/2023    2:50 PM 05/22/2022    9:08 AM  Readmission Risk Prevention Plan  Transportation Screening Complete Complete  HRI or Home Care Consult Complete Complete  Social Work Consult for Recovery Care Planning/Counseling Complete Complete  Palliative Care Screening Not Applicable Not Applicable  Medication Review Oceanographer) Complete Complete

## 2023-07-22 NOTE — Progress Notes (Signed)
 NAME:  [name] [name], MRN:  [id number], DOB:  05/15/1965, LOS: 17 ADMISSION DATE:  07/05/2023, CONSULTATION DATE:  07/06/2023 REFERRING MD:  Randye Lobo, CHIEF COMPLAINT:  Abdominal Pain   History of Present Illness:  [name] [name] is a 58 year old gentleman with a known medical history significant for hypertension, COPD, alcoholic liver cirrhosis, hemosiderosis, CKD 3B, dyslipidemia, prior left lower extremity DVT presenting to [facility] ED secondary to 3 to 4-day complaint of abdominal pain.  Patient describes bilateral flank type pain increasing in intensity without radiation to groin chest or back.  He states this is similar pain to his previous admission for pancreatitis.  Patient does elicit nausea and vomiting and diarrhea.   It is noted that the patient does have history of cholelithiasis and decompensated cirrhosis.  Patient reports he has not had any alcohol since his birthday on [birth year], however according to GI note he made comments that his last drink was on this past Sunday.  CT of the abdomen pelvis without contrast performed on July 05, 2023 reveals atrophic left kidney, gallstones and diffuse pancreatitis  Abdomen Limited ultrasound performed today 07/06/2023 reveals no significant abdominal ascites.  Laboratory indices: He does have elevation in coagulation studies with a PT of 26 on yesterday that elevated to 50.4 today 3 10/31/2023 and INR at admission yesterday 2.4 elevated to 5.5 today.  CBC: WBC 15.3, hemoglobin 11, hematocrit 36.7, platelets 164.  BMP sodium 132, potassium 5.8, chloride 99, CO2 14 BUN 40, creatinine 4.81 and glucose 214.  LFTs: Lipase 2929, AST 5021, ALT 05/08/2007.  Secondary to patient's known CKD presenting now with AKI and presence of oliguria a temporary HD line was placed by surgery team today for hemodialysis.  Patient underwent 2-hour HD in the ED prior to transfer.  Patient did receive IV fluids for pancreatitis management, as he is a poor candidate for  liver transplant due to ongoing alcohol use per GI note.  Consults: Patient has been followed by nephrology and gastrointestinal services Blood cultures sent 07/06/2023 secondary to leukocytosis.  As such patient was transferred to [facility] for ICU monitoring and management and admitted with acute on chronic liver failure/pancreatitis.  Pertinent  Medical History  Asthma, B12 deficiency, cirrhosis, dyspnea, hyperlipidemia, history of kidney stones, hypertension, kidney stones, COPD, prior lower left extremity DVT, chronic anemia.  Significant Hospital Events: Including procedures, antibiotic start and stop dates in addition to other pertinent events   3/7 Hemodialysis  (temporary HD line placed) s/p short HD, tx to [facility] 3/8 worsening shock, pressors, CTH/ CT a/p 3/9 intubated. On 3 pressors. Renal fxn and lactic acidosis worse. Started CRRT 3/10 LFTs a little better. Still on 3 pressors. Comfortable efforts on PSV but mental status not amendable to extubation. ECHO EF 55% no WMA, grade I diastolic dysfxn, started stress dose steroids. Right internal jugular CVL placed. The epi was weaned off over course of am after stress dose steroids started, NE weaning stopped fent gtt changed to PRN. D10 rate reduced to 10cc/hr. Acetylcysteine completed  3/11 vasopressin discontinued on very low-dose norepinephrine starting to wean sedation further to assess for readiness to wean ventilation but not responding and appeared to have left gaze pref after sedation off for prolonged time. CT head obtained no acute findings. Abx switched to zosyn 3/12 woke up during evening. Agitated at times. Attempting to get OOB. Still seeming to have left sided weakness but exam much closer to that of the 10th. Norepi off.  Extubated.  Taken off  CRRT, encephalopathic but tolerated extubation 3/13 agitated during the evening hours, hypertensive and tachycardic moaning.  Started on Precedex.  This was discontinued on a.m. rounds.   Remains off pressors remains off dialysis minimal urine output.  Had a goals of care discussion with palliative and critical care.  Critical care recommending DO NOT RESUSCITATE, also shared with family patient's appropriateness for hospice going forward.  Family taking CODE STATUS into advisement with plan to follow-up 3/14 Patient was agitated overnight, pulled out his core track. Off vasopressors 3/15 Back on pressors including levo and vaso, likely secondary to volume depletion given improvement with albumin but antibiotics and steroids also resumed 3/16 nephrology evaluated and decision made to start back CRRT.  Repeat CT chest abdomen pelvis with ongoing solid pancreatitis and mediastinal adenopathy 3/17 Some agitation overnight, back on pressors this morning 3/19 remains off CRRT, pending goals of care discussion 3/20 remains on Levophed at 7, off CRRT.  Awaiting formal decision per family for goals of care 3/21 completed abx 3/22 no distress renal fxn worse  Interim History / Subjective:  Looks about the same  Objective   Blood pressure 110/65, pulse (!) 111, temperature (!) 96.7 F (35.9 C), temperature source Axillary, resp. rate (!) 23, height 6' (1.829 m), weight 86.5 kg, SpO2 97%.        Intake/Output Summary (Last 24 hours) at 07/22/2023 0835 Last data filed at 07/22/2023 0500 Gross per 24 hour  Intake 388.17 ml  Output 0 ml  Net 388.17 ml   Filed Weights   07/17/23 0343 07/19/23 0500 07/22/23 0500  Weight: 83 kg 86.5 kg 86.5 kg    Examination:  General laying in bed no acute distress this morning HEENT normocephalic atraumatic Neuro: Awakens to voice, more confused today, moves all extremities,  Pulmonary: Clear diminished bases Cardiac regular rate and rhythm Abdomen soft nontender Extremities warm dry GU no urine output  Resolved Hospital Problem list   Lactic acid Acute hypoxic respiratory failure resolved extubated 3/12 Severe sepsis with septic shock, POA  now resolved Aspiration pneumonia -Completed 7 days of IV Zosyn 3/14 Thrombocytopenia  Assessment & Plan:   Persistent vasodilatory shock -culture-negative  Acute on chronic pancreatitis secondary to gallstone versus alcohol use repeat ct abd 15th c/w on-going pancreatitis  History of pancreatic divisum on MRCP Acute on chronic liver failure Alcoholic cirrhosis-MELD 35 Coagulopathy of liver disease Dialysis dependent anuric AKI superimposed on CKD stage IIIb likely secondary to ischemic ATN anemia of chronic illness, stable Acute encephalopathy, combined metabolic and hepatic, improving Recurrent hypoglycemia now with progressively hyperglycemia Severe protein calorie malnutrition DNR Comfort care   Discussion 58 year old male patient who initially came in with mixed picture of SIRS versus sepsis with persistent vasoplegia in the setting of acute pancreatitis, further complicated by acute liver injury, worsening encephalopathy, and acute on chronic renal failure.  Shock was initially improved, was intubated for short period of time and then successfully extubated, however in spite of appropriate antibiotics, supportive care, and time.  He was also supported on continuous renal replacement therapy.  Given his advanced cirrhosis he is not a candidate for long-term dialysis.  Dialysis was discontinued, hemodynamically he has remained vasoplegic, he has had extensive family meetings with his sister, critical care, as well as palliative.  Last palliative care meeting was on 3/22.  DO NOT RESUSCITATE remains.  No escalation.  Plan Continuing supportive care Plan is to discontinue norepinephrine at some point today Then we will transfer to floor for inpatient comfort, I  do not think it will be realistic to assume he could make it to residential hospice    Best Practice (right click and "Reselect all SmartList Selections" daily)   Diet: adv as able  DVT prophylaxis prophylactic heparin   Pressure ulcer(s): N/A GI prophylaxis: PPI Lines: central line.  Femoral HD catheter  Foley: Discontinue Code Status: DNR Last date of multidisciplinary goals of care discussion [palliative care is following]  My cct NA  07/22/2023 8:35 AM

## 2023-07-22 NOTE — Progress Notes (Signed)
   07/22/23 1645  Spiritual Encounters  Type of Visit Initial  Care provided to: Pt and family  Referral source Family  Reason for visit End-of-life  OnCall Visit Yes  Spiritual Framework  Presenting Themes Rituals and practive;Significant life change  Family Stress Factors Loss  Interventions  Spiritual Care Interventions Made Established relationship of care and support;Compassionate presence;Reflective listening;Prayer  Intervention Outcomes  Outcomes Connection to spiritual care   Chaplain rec'd page from NP noting that catheter to be removed and prognosis is poor for survival. Family requesting prayer.  Chaplain found the family in the room at bedside: aunt, sister, and brother in law. Family okay with chaplain visit. Pt awake and requesting bed adjustments from family. Chaplain reviewed hx of pt's current experience.   Family acknowledges poor prognosis for survival. Chaplain spoke with family about their experience with pt's hospitalization. Family is grieving. As requested, chaplain prayed for pt and family.   Chaplain alerted RN that visit complete.  Chaplain

## 2023-07-22 NOTE — Progress Notes (Signed)
 Palliative Medicine Progress Note   Patient Name: [name] [name]       Date: 07/22/2023 DOB: 12/30/1965  Age: 58 y.o. MRN#: [id number] Attending Physician: Lynnell Catalan, MD Primary Care Physician: Benetta Spar, MD Admit Date: 07/05/2023   HPI/Patient Profile: 58 y.o. male  with past medical history of hypertension, COPD, alcoholic liver cirrhosis, hemosiderosis, CKD 3B, dyslipidemia, prior left lower extremity DVT presenting to [facility] ED with complaint of abdominal pain. He was admitted on 07/05/2023 with acute pancreatitis with septic shock and lactic acidosis, decompensated EtOH cirrhosis complicated by shock liver with associated coagulopathy and thrombocytopenia, acute encephalopathy, and others.    Patient transferred to [facility] on 07/06/23 for higher level of care.  See significant hospital events below. Intubated 3/9, extubated 3/12. CRRT 3/9 - 3/12 and 3/16 - 3/18   He has remained hypotensive, and has been on and off pressors.  He did not respond to fluid or Lasix challenge.  He has remained encephalopathic and confused.  Per nephrology, he is not a candidate for long-term HD.   Palliative medicine was consulted for GOC conversations. Family meeting with PMT and PCCM on 3/12; discussed  patient's advanced liver disease and overall poor prognosis. Code status changed to DNR on 3/13. Another family meeting held on 3/19; discussed possible transition to comfort care.     Subjective: Chart reviewed.  Update received from RN.  Patient is on 10 mcg Levophed.  He is more lethargic today.   Discussed with CSW - hospice facility in [place] does not have a bed today.  I met with Sister/[name] [name] and aunt/[name] [name] in the 69M waiting room.  They are agreeable to transition to comfort care at  this time.  We reviewed comfort care as allowing the natural course to occur, and discontinuing vasopressor support.  They are agreeable to spiritual support from the chaplain, and then turning off the vasopressor medication.  We reviewed natural trajectory at end-of-life.  Emotional support provided.   Objective:  Physical Exam Vitals reviewed.  Constitutional:      General: He is not in acute distress.    Appearance: He is ill-appearing.  Pulmonary:     Effort: Pulmonary effort is normal.  Abdominal:     General: There is distension.  Neurological:     Mental Status: He is lethargic.  Palliative Medicine Assessment & Plan   Assessment: Principal Problem:   Acute pancreatitis Active Problems:   Alcoholic cirrhosis (HCC)   Shock liver   Need for acute hemodialysis (HCC)   Acute biliary pancreatitis   Coagulopathy (HCC)   Somnolence   Septic shock (HCC)   Elevated liver enzymes   Palliative care by specialist   Goals of care, counseling/discussion   Protein-calorie malnutrition, severe    Recommendations/Plan: Transition to comfort care Consult to spiritual support Turn off vasopressor when family is ready Transfer to 6N PMT will continue to follow   Code Status: DNR - comfort   Prognosis:  Hours - Days  Discharge Planning: Anticipated Hospital Death  Care plan was discussed with PCCM, RN, TOC  Thank you for allowing the Palliative Medicine Team to assist in the care of this patient.   Time: 50 minutes   Merry Proud, NP   Please contact Palliative Medicine Team phone at [phone] for questions and concerns.  For individual providers, please see AMION.

## 2023-07-23 DIAGNOSIS — K852 Alcohol induced acute pancreatitis without necrosis or infection: Secondary | ICD-10-CM | POA: Diagnosis not present

## 2023-07-23 DIAGNOSIS — R638 Other symptoms and signs concerning food and fluid intake: Secondary | ICD-10-CM

## 2023-07-23 DIAGNOSIS — Z515 Encounter for palliative care: Secondary | ICD-10-CM | POA: Diagnosis not present

## 2023-07-23 DIAGNOSIS — R1084 Generalized abdominal pain: Secondary | ICD-10-CM | POA: Diagnosis not present

## 2023-07-23 DIAGNOSIS — Z789 Other specified health status: Secondary | ICD-10-CM

## 2023-07-23 DIAGNOSIS — Z66 Do not resuscitate: Secondary | ICD-10-CM | POA: Diagnosis not present

## 2023-07-23 MED ORDER — HYDROMORPHONE HCL 1 MG/ML IJ SOLN
0.5000 mg | INTRAMUSCULAR | Status: DC | PRN
  Administered 2023-07-23 – 2023-07-24 (×2): 2 mg via INTRAVENOUS
  Administered 2023-07-24 (×3): 1 mg via INTRAVENOUS
  Filled 2023-07-23: qty 2
  Filled 2023-07-23: qty 1
  Filled 2023-07-23: qty 2
  Filled 2023-07-23 (×2): qty 1

## 2023-07-23 NOTE — TOC Progression Note (Signed)
 Transition of Care [facility]) - Progression Note    Patient Details  Name: [name] [name] MRN: [id number] Date of Birth: 1965-09-17  Transition of Care [facility]) CM/SW Contact  Tom-Johnson, Hershal Coria, RN Phone Number: 07/23/2023, 1:05 PM  Clinical Narrative:     CM notified by Liaison at [facility] that they have available and they have submitted for insurance authorization and awaiting response.   CM will continue to follow and update.      Barriers to Discharge: Continued Medical Work up  Expected Discharge Plan and Services       Living arrangements for the past 2 months: Single Family Home                                       Social Determinants of Health (SDOH) Interventions SDOH Screenings   Food Insecurity: No Food Insecurity (07/06/2023)  Housing: Low Risk  (07/06/2023)  Transportation Needs: No Transportation Needs (07/06/2023)  Utilities: Not At Risk (07/06/2023)  Alcohol Screen: Low Risk  (09/13/2020)  Financial Resource Strain: High Risk (11/28/2021)   [facility]  Physical Activity: Sufficiently Active (09/13/2020)  Social Connections: Moderately Isolated (09/13/2020)  Stress: No Stress Concern Present (09/13/2020)  Tobacco Use: High Risk (07/05/2023)    Readmission Risk Interventions    03/13/2023    2:50 PM 05/22/2022    9:08 AM  Readmission Risk Prevention Plan  Transportation Screening Complete Complete  HRI or Home Care Consult Complete Complete  Social Work Consult for Recovery Care Planning/Counseling Complete Complete  Palliative Care Screening Not Applicable Not Applicable  Medication Review Oceanographer) Complete Complete

## 2023-07-23 NOTE — Progress Notes (Addendum)
 Daily Progress Note   Patient Name: [name] [name]       Date: 07/23/2023 DOB: Feb 18, 1966  Age: 58 y.o. MRN#: [id number] Attending Physician: Karren Burly, MD Primary Care Physician: Benetta Spar, MD Admit Date: 07/05/2023  Reason for Consultation/Follow-up: Non pain symptom management, Pain control, Psychosocial/spiritual support, and Terminal Care  Subjective: I have reviewed medical records including EPIC notes, MAR, any available advanced directives as necessary, and labs. Received report from primary RN - no acute concerns.   Went to visit patient at bedside - no family/visitors present. Patient was lying in bed asleep - I did not attempt to wake him to preserve comfort. No signs or non-verbal gestures of pain or discomfort noted. No respiratory distress, increased work of breathing, or secretions noted. Mitts in use.  Requested RN remove mitts. Discussed with RN transfer out of ICU level care.   Length of Stay: 18  Current Medications: Scheduled Meds:   nicotine  14 mg Transdermal Daily   mouth rinse  15 mL Mouth Rinse 4 times per day    Continuous Infusions:   PRN Meds: acetaminophen **OR** acetaminophen, antiseptic oral rinse, glycopyrrolate, haloperidol lactate, HYDROmorphone (DILAUDID) injection, ipratropium-albuterol, LORazepam, [DISCONTINUED] ondansetron **OR** ondansetron (ZOFRAN) IV, mouth rinse, mouth rinse, oxyCODONE, polyvinyl alcohol  Physical Exam Vitals and nursing note reviewed.  Constitutional:      General: He is not in acute distress.    Appearance: He is ill-appearing.  Pulmonary:     Effort: No respiratory distress.  Skin:    General: Skin is warm and dry.  Neurological:     Mental Status: He is lethargic.     Motor: Weakness present.              Vital Signs: BP (!) 70/52   Pulse 97   Temp (!) 96.8 F (36 C) (Axillary)   Resp 13   Ht 6' (1.829 m)   Wt 86.5 kg   SpO2 98%   BMI 25.86 kg/m  SpO2: SpO2: 98 % O2 Device: O2 Device: Room Air O2 Flow Rate: O2 Flow Rate (L/min): 2 L/min  Intake/output summary:  Intake/Output Summary (Last 24 hours) at 07/23/2023 0916 Last data filed at 07/22/2023 1800 Gross per 24 hour  Intake 87.44 ml  Output --  Net 87.44 ml   LBM: Last BM  Date : 07/21/23 Baseline Weight: Weight: 73.9 kg Most recent weight: Weight: 86.5 kg       Palliative Assessment/Data: PPS 10%      Patient Active Problem List   Diagnosis Date Noted   Protein-calorie malnutrition, severe 07/12/2023   Palliative care by specialist 07/10/2023   Goals of care, counseling/discussion 07/10/2023   Elevated liver enzymes 07/08/2023   Coagulopathy (HCC) 07/07/2023   Somnolence 07/07/2023   Septic shock (HCC) 07/07/2023   Shock liver 07/06/2023   Need for acute hemodialysis (HCC) 07/06/2023   Acute biliary pancreatitis 07/06/2023   Dog bite 03/12/2023   Acute pancreatitis 03/11/2023   Metabolic acidosis 09/25/2022   Bladder outlet obstruction 09/25/2022   Acute pancreatitis without infection or necrosis 05/24/2022   Alcohol-induced acute pancreatitis 05/23/2022   Cholelithiasis 05/20/2022   Tobacco use disorder 05/20/2022   GERD (gastroesophageal reflux disease) 05/20/2022   B12 deficiency 03/15/2022   History of colonic polyps 03/09/2022   Gallbladder polyp 03/01/2022   Alcohol abuse 11/28/2021   Upper abdominal pain 08/11/2021   AKI (acute kidney injury) (HCC) 08/11/2021   Portal hypertension (HCC) 08/03/2021   Other ascites 08/03/2021   Moderate protein-calorie malnutrition (HCC) 08/03/2021   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   Elevated ferritin 08/26/2020   Hypomagnesemia 08/11/2020   Hypoalbuminemia 08/11/2020   Hypotension 08/11/2020   Anemia of chronic disease  08/11/2020   DVT (deep venous thrombosis) -Lt LL Extensive DVT 08/05/2020   Alcoholic cirrhosis (HCC) 08/05/2020   COPD (chronic obstructive pulmonary disease) (HCC) 08/05/2020   Hypokalemia 08/05/2020   Cirrhosis of liver without ascites (HCC) 08/04/2020   Edema of left lower extremity 08/04/2020   Loss of weight 07/07/2020   Loss of appetite 07/07/2020   Anasarca 07/07/2020   Heme + stool 01/22/2017   Abnormal LFTs 01/22/2017   Hyperlipidemia 02/08/2015   Cigarette nicotine dependence with nicotine-induced disorder 02/08/2015   Leukocytosis 01/07/2013   Hyponatremia 01/07/2013   UTI (urinary tract infection) 01/06/2013   Kidney stone 01/06/2013   Ureteral colic 01/06/2013    Palliative Care Assessment & Plan   Patient Profile: 58 y.o. male  with past medical history of hypertension, COPD, alcoholic liver cirrhosis, hemosiderosis, CKD 3B, dyslipidemia, prior left lower extremity DVT presenting to [facility] ED with complaint of abdominal pain. He was admitted on 07/05/2023 with acute pancreatitis with septic shock and lactic acidosis, decompensated EtOH cirrhosis complicated by shock liver with associated coagulopathy and thrombocytopenia, acute encephalopathy, and others.    Patient transferred to [facility] on 07/06/23 for higher level of care.  See significant hospital events below. Intubated 3/9, extubated 3/12. CRRT 3/9 - 3/12 and 3/16 - 3/18   He has remained hypotensive, and has been on and off pressors.  He did not respond to fluid or Lasix challenge.  He has remained encephalopathic and confused.  Per nephrology, he is not a candidate for long-term HD.   Palliative medicine was consulted for GOC conversations. Family meeting with PMT and PCCM on 3/12; discussed  patient's advanced liver disease and overall poor prognosis. Code status changed to DNR on 3/13. Another family meeting held on 3/19; discussed possible transition to comfort care.   Assessment: Principal Problem:   Acute  pancreatitis Active Problems:   Alcoholic cirrhosis (HCC)   Shock liver   Need for acute hemodialysis (HCC)   Acute biliary pancreatitis   Coagulopathy (HCC)   Somnolence   Septic shock (HCC)   Elevated liver enzymes   Palliative care by  specialist   Goals of care, counseling/discussion   Protein-calorie malnutrition, severe   Terminal care  Recommendations/Plan: Continue full comfort measures Continue DNR/DNI as previously documented Anticipate hospital death - not stable for hospice transfer Transfer out of ICU orders placed Continue current comfort focused medication regimen - no changes PMT will continue to follow and support holistically  Symptom Management Dilaudid PRN severe pain/dyspnea/increased work of breathing/RR>25 Oxycodone PRN moderate pain Tylenol PRN pain/fever Biotin PRN dry mouth Robinul PRN secretions Haldol PRN agitation/delirium Ativan PRN anxiety/seizure/sleep/distress Zofran PRN nausea/vomiting Liquifilm Tears PRN dry eye Nicotine patch daily   Goals of Care and Additional Recommendations: Limitations on Scope of Treatment: Full Comfort Care  Code Status:    Code Status Orders  (From admission, onward)           Start     Ordered   07/22/23 1608  Do not attempt resuscitation (DNR) - Comfort care  Continuous       Question Answer Comment  If patient has no pulse and is not breathing Do Not Attempt Resuscitation   In Pre-Arrest Conditions (Patient Is Breathing and Has a Pulse) Provide comfort measures. Relieve any mechanical airway obstruction. Avoid transfer unless required for comfort.   Consent: Discussion documented in EHR or advanced directives reviewed      07/22/23 1610           Code Status History     Date Active Date Inactive Code Status Order ID Comments User Context   07/12/2023 1618 07/22/2023 1610 Do not attempt resuscitation (DNR) PRE-ARREST INTERVENTIONS DESIRED [id number]  Merry Proud, NP Inpatient    07/06/2023 2158 07/12/2023 1618 Full Code [id number]  Marylu Lund Inpatient   07/05/2023 1312 07/06/2023 2158 Full Code [id number]  Erick Blinks, DO ED   03/12/2023 0241 03/14/2023 1553 Full Code [id number]  Zierle-Ghosh, Asia B, DO Inpatient   09/23/2022 0352 09/25/2022 1624 Full Code [id number]  Lilyan Gilford, DO ED   05/19/2022 2232 05/24/2022 1733 Full Code [id number]  Zierle-Ghosh, Asia B, DO ED   03/02/2022 1558 03/04/2022 1717 Full Code [id number]  Erick Blinks, DO Inpatient   02/09/2021 2224 02/10/2021 2220 Full Code [id number]  Elgergawy, Leana Roe, MD Inpatient   08/05/2020 1619 08/11/2020 2231 Full Code [id number]  Shon Hale, MD Inpatient   01/06/2013 2358 01/09/2013 1651 Full Code [id number]  Vania Rea, MD Inpatient       Prognosis:  Likely less than 24 hours  Discharge Planning: Anticipated Hospital Death  Care plan was discussed with primary RN  Thank you for allowing the Palliative Medicine Team to assist in the care of this patient.     Haskel Khan, NP  Please contact Palliative Medicine Team phone at [phone] for questions and concerns.   *Portions of this note are a verbal dictation therefore any spelling and/or grammatical errors are due to the "[employer]" system interpretation.

## 2023-07-23 NOTE — Plan of Care (Signed)
  Problem: Education: Goal: Knowledge of General Education information will improve Description: Including pain rating scale, medication(s)/side effects and non-pharmacologic comfort measures Outcome: Not Applicable   Problem: Health Behavior/Discharge Planning: Goal: Ability to manage health-related needs will improve Outcome: Not Applicable   Problem: Clinical Measurements: Goal: Ability to maintain clinical measurements within normal limits will improve Outcome: Not Applicable Goal: Will remain free from infection Outcome: Not Applicable Goal: Diagnostic test results will improve Outcome: Not Applicable Goal: Respiratory complications will improve Outcome: Not Applicable Goal: Cardiovascular complication will be avoided Outcome: Not Applicable   Problem: Activity: Goal: Risk for activity intolerance will decrease Outcome: Not Applicable   Problem: Nutrition: Goal: Adequate nutrition will be maintained Outcome: Not Applicable   Problem: Coping: Goal: Level of anxiety will decrease Outcome: Not Applicable   Problem: Elimination: Goal: Will not experience complications related to bowel motility Outcome: Not Applicable Goal: Will not experience complications related to urinary retention Outcome: Not Applicable   Problem: Pain Managment: Goal: General experience of comfort will improve and/or be controlled Outcome: Not Applicable   Problem: Safety: Goal: Ability to remain free from injury will improve Outcome: Not Applicable   Problem: Skin Integrity: Goal: Risk for impaired skin integrity will decrease Outcome: Not Applicable   Problem: Safety: Goal: Non-violent Restraint(s) Outcome: Not Applicable   Problem: Education: Goal: Ability to describe self-care measures that may prevent or decrease complications (Diabetes Survival Skills Education) will improve Outcome: Not Applicable Goal: Individualized Educational Video(s) Outcome: Not Applicable   Problem:  Coping: Goal: Ability to adjust to condition or change in health will improve Outcome: Not Applicable   Problem: Fluid Volume: Goal: Ability to maintain a balanced intake and output will improve Outcome: Not Applicable   Problem: Health Behavior/Discharge Planning: Goal: Ability to identify and utilize available resources and services will improve Outcome: Not Applicable Goal: Ability to manage health-related needs will improve Outcome: Not Applicable   Problem: Metabolic: Goal: Ability to maintain appropriate glucose levels will improve Outcome: Not Applicable   Problem: Nutritional: Goal: Maintenance of adequate nutrition will improve Outcome: Not Applicable Goal: Progress toward achieving an optimal weight will improve Outcome: Not Applicable   Problem: Skin Integrity: Goal: Risk for impaired skin integrity will decrease Outcome: Not Applicable   Problem: Tissue Perfusion: Goal: Adequacy of tissue perfusion will improve Outcome: Not Applicable   Problem: Education: Goal: Knowledge of the prescribed therapeutic regimen will improve Outcome: Not Applicable   Problem: Coping: Goal: Ability to identify and develop effective coping behavior will improve Outcome: Not Applicable   Problem: Clinical Measurements: Goal: Quality of life will improve Outcome: Not Applicable   Problem: Respiratory: Goal: Verbalizations of increased ease of respirations will increase Outcome: Not Applicable   Problem: Role Relationship: Goal: Family's ability to cope with current situation will improve Outcome: Not Applicable Goal: Ability to verbalize concerns, feelings, and thoughts to partner or family member will improve Outcome: Not Applicable   Problem: Pain Management: Goal: Satisfaction with pain management regimen will improve Outcome: Not Applicable

## 2023-07-23 NOTE — Progress Notes (Signed)
 This chaplain is responding to PMT-JM consult for spiritual care. The chaplain understands Alphonsa Overall responded to 3/23 page for prayer.  The chaplain is appreciative of RN-Teresa update on the Pt. and family. The chaplain left pager number for F/U and EOL spiritual care as needed.  Chaplain Stephanie Acre [phone]

## 2023-07-23 NOTE — Progress Notes (Signed)
 Nutrition Brief Note  Chart reviewed. Pt now transitioning to comfort care.  No further nutrition interventions planned at this time.  Please re-consult as needed.   Drusilla Kanner, RDN, LDN Clinical Nutrition See AMiON for contact information.

## 2023-07-23 NOTE — Progress Notes (Signed)
 NAME:  [name] [name], MRN:  [id number], DOB:  1965/09/25, LOS: 18 ADMISSION DATE:  07/05/2023, CONSULTATION DATE:  07/06/2023 REFERRING MD:  Randye Lobo, CHIEF COMPLAINT:  Abdominal Pain   History of Present Illness:  [name] [name] is a 58 year old gentleman with a known medical history significant for hypertension, COPD, alcoholic liver cirrhosis, hemosiderosis, CKD 3B, dyslipidemia, prior left lower extremity DVT presenting to [facility] health ED secondary to 3 to 4-day complaint of abdominal pain.  Patient describes bilateral flank type pain increasing in intensity without radiation to groin chest or back.  He states this is similar pain to his previous admission for pancreatitis.  Patient does elicit nausea and vomiting and diarrhea.   It is noted that the patient does have history of cholelithiasis and decompensated cirrhosis.  Patient reports he has not had any alcohol since his birthday on [birth year], however according to GI note he made comments that his last drink was on this past Sunday.  CT of the abdomen pelvis without contrast performed on July 05, 2023 reveals atrophic left kidney, gallstones and diffuse pancreatitis  Abdomen Limited ultrasound performed today 07/06/2023 reveals no significant abdominal ascites.  Laboratory indices: He does have elevation in coagulation studies with a PT of 26 on yesterday that elevated to 50.4 today 3 10/31/2023 and INR at admission yesterday 2.4 elevated to 5.5 today.  CBC: WBC 15.3, hemoglobin 11, hematocrit 36.7, platelets 164.  BMP sodium 132, potassium 5.8, chloride 99, CO2 14 BUN 40, creatinine 4.81 and glucose 214.  LFTs: Lipase 2929, AST 5021, ALT 05/08/2007.  Secondary to patient's known CKD presenting now with AKI and presence of oliguria a temporary HD line was placed by surgery team today for hemodialysis.  Patient underwent 2-hour HD in the ED prior to transfer.  Patient did receive IV fluids for pancreatitis management, as he is a poor candidate for  liver transplant due to ongoing alcohol use per GI note.  Consults: Patient has been followed by nephrology and gastrointestinal services Blood cultures sent 07/06/2023 secondary to leukocytosis.  As such patient was transferred to [facility] for ICU monitoring and management and admitted with acute on chronic liver failure/pancreatitis.  Pertinent  Medical History  Asthma, B12 deficiency, cirrhosis, dyspnea, hyperlipidemia, history of kidney stones, hypertension, kidney stones, COPD, prior lower left extremity DVT, chronic anemia.  Significant Hospital Events: Including procedures, antibiotic start and stop dates in addition to other pertinent events   3/7 Hemodialysis  (temporary HD line placed) s/p short HD, tx to [facility] 3/8 worsening shock, pressors, CTH/ CT a/p 3/9 intubated. On 3 pressors. Renal fxn and lactic acidosis worse. Started CRRT 3/10 LFTs a little better. Still on 3 pressors. Comfortable efforts on PSV but mental status not amendable to extubation. ECHO EF 55% no WMA, grade I diastolic dysfxn, started stress dose steroids. Right internal jugular CVL placed. The epi was weaned off over course of am after stress dose steroids started, NE weaning stopped fent gtt changed to PRN. D10 rate reduced to 10cc/hr. Acetylcysteine completed  3/11 vasopressin discontinued on very low-dose norepinephrine starting to wean sedation further to assess for readiness to wean ventilation but not responding and appeared to have left gaze pref after sedation off for prolonged time. CT head obtained no acute findings. Abx switched to zosyn 3/12 woke up during evening. Agitated at times. Attempting to get OOB. Still seeming to have left sided weakness but exam much closer to that of the 10th. Norepi off.  Extubated.  Taken off  CRRT, encephalopathic but tolerated extubation 3/13 agitated during the evening hours, hypertensive and tachycardic moaning.  Started on Precedex.  This was discontinued on a.m. rounds.   Remains off pressors remains off dialysis minimal urine output.  Had a goals of care discussion with palliative and critical care.  Critical care recommending DO NOT RESUSCITATE, also shared with family patient's appropriateness for hospice going forward.  Family taking CODE STATUS into advisement with plan to follow-up 3/14 Patient was agitated overnight, pulled out his core track. Off vasopressors 3/15 Back on pressors including levo and vaso, likely secondary to volume depletion given improvement with albumin but antibiotics and steroids also resumed 3/16 nephrology evaluated and decision made to start back CRRT.  Repeat CT chest abdomen pelvis with ongoing solid pancreatitis and mediastinal adenopathy 3/17 Some agitation overnight, back on pressors this morning 3/19 remains off CRRT, pending goals of care discussion 3/20 remains on Levophed at 7, off CRRT.  Awaiting formal decision per family for goals of care 3/21 completed abx 3/22 no distress renal fxn worse  3/23 Transitioned full comfort care NE stopped, Bps in 70s Interim History / Subjective:  Appears comfortable, requiring no continuous drips. Bps in 70s.  Objective   Blood pressure (!) 70/52, pulse 97, temperature (!) 96.8 F (36 C), temperature source Axillary, resp. rate 13, height 6' (1.829 m), weight 86.5 kg, SpO2 98%.        Intake/Output Summary (Last 24 hours) at 07/23/2023 4132 Last data filed at 07/22/2023 1800 Gross per 24 hour  Intake 98.64 ml  Output --  Net 98.64 ml   Filed Weights   07/17/23 0343 07/19/23 0500 07/22/23 0500  Weight: 83 kg 86.5 kg 86.5 kg    Examination:  General laying in bed no acute distress this morning HEENT normocephalic atraumatic Neuro: Resting comfortably Pulmonary: Breathing comfortably on RA Cardiac regular rate and rhythm Abdomen soft nontender Extremities warm dry GU no urine output  Resolved Hospital Problem list   Lactic acid Acute hypoxic respiratory failure resolved  extubated 3/12 Severe sepsis with septic shock, POA now resolved Aspiration pneumonia -Completed 7 days of IV Zosyn 3/14 Thrombocytopenia  Assessment & Plan:   Persistent vasodilatory shock -culture-negative  Acute on chronic pancreatitis secondary to gallstone versus alcohol use repeat ct abd 15th c/w on-going pancreatitis  History of pancreatic divisum on MRCP Acute on chronic liver failure Alcoholic cirrhosis-MELD 35 Coagulopathy of liver disease Dialysis dependent anuric AKI superimposed on CKD stage IIIb likely secondary to ischemic ATN anemia of chronic illness, stable Acute encephalopathy, combined metabolic and hepatic, improving Recurrent hypoglycemia now with progressively hyperglycemia Severe protein calorie malnutrition DNR Comfort care   Discussion 58 year old male patient who initially came in with mixed picture of SIRS versus sepsis with persistent vasoplegia in the setting of acute pancreatitis, further complicated by acute liver injury, worsening encephalopathy, and acute on chronic renal failure.  Shock was initially improved, was intubated for short period of time and then successfully extubated, however in spite of appropriate antibiotics, supportive care, and time.  He was also supported on continuous renal replacement therapy.  Given his advanced cirrhosis he is not a candidate for long-term dialysis.  Dialysis was discontinued, hemodynamically he has remained vasoplegic, he has had extensive family meetings with his sister, critical care, as well as palliative.  Last palliative care meeting was on 3/22.  DO NOT RESUSCITATE remains.  No escalation. Started comfort measures PM 3/23  Plan Continue meds for comfort per palliative care, appreciate assistance Adjusted dilaudid  PRN for RR > 20 SBP in 70s slowly trendiong down, do not think candidate for inpatient hospice Plan transfer out of ICU  Best Practice (right click and "Reselect all SmartList Selections" daily)    Diet: whatever for comfort DVT prophylaxis prophylactic heparin  Pressure ulcer(s): N/A GI prophylaxis: PPI Lines:  has TLC in neck, HD cath in groin, plan for d/c if has enough access Foley: d/c'd Code Status: DNR Last date of multidisciplinary goals of care discussion [palliative care is following]   Karren Burly, MD 07/23/2023 8:38 AM

## 2023-07-24 DIAGNOSIS — Z66 Do not resuscitate: Secondary | ICD-10-CM | POA: Diagnosis not present

## 2023-07-24 DIAGNOSIS — K852 Alcohol induced acute pancreatitis without necrosis or infection: Secondary | ICD-10-CM | POA: Diagnosis not present

## 2023-07-24 DIAGNOSIS — R1084 Generalized abdominal pain: Secondary | ICD-10-CM | POA: Diagnosis not present

## 2023-07-24 DIAGNOSIS — Z515 Encounter for palliative care: Secondary | ICD-10-CM | POA: Diagnosis not present

## 2023-07-24 MED ORDER — BIOTENE DRY MOUTH MT LIQD
15.0000 mL | Freq: Two times a day (BID) | OROMUCOSAL | Status: DC
  Administered 2023-07-24: 15 mL via TOPICAL

## 2023-07-24 MED ORDER — POLYVINYL ALCOHOL 1.4 % OP SOLN
1.0000 [drp] | OPHTHALMIC | Status: DC
  Administered 2023-07-24 (×2): 1 [drp] via OPHTHALMIC
  Filled 2023-07-24: qty 15

## 2023-07-25 ENCOUNTER — Inpatient Hospital Stay: Payer: Medicaid Other

## 2023-07-31 NOTE — Progress Notes (Signed)
 Daily Progress Note   Patient Name: [name] [name]       Date: 07/23/2023 DOB: 06-May-1965  Age: 58 y.o. MRN#: [id number] Attending Physician: Jerald Kief, MD Primary Care Physician: Benetta Spar, MD Admit Date: 07/05/2023  Reason for Consultation/Follow-up: Non pain symptom management, Pain control, Psychosocial/spiritual support, and Terminal Care  Subjective: I have reviewed medical records including EPIC notes, MAR, any available advanced directives as necessary, and labs. Received report from primary RN - no acute concerns.  Went to visit patient at bedside - no family/visitors present. Patient was lying in bed unresponsive. Signs of pain/discomfort noted - moaning. No respiratory distress, increased work of breathing, or secretions noted. His eyes are partially open - will schedule eye drops.  Requested RN provide dose of dilaudid.   Length of Stay: 19  Current Medications: Scheduled Meds:   nicotine  14 mg Transdermal Daily   mouth rinse  15 mL Mouth Rinse 4 times per day    Continuous Infusions:   PRN Meds: acetaminophen **OR** acetaminophen, antiseptic oral rinse, glycopyrrolate, haloperidol lactate, HYDROmorphone (DILAUDID) injection, ipratropium-albuterol, LORazepam, [DISCONTINUED] ondansetron **OR** ondansetron (ZOFRAN) IV, mouth rinse, mouth rinse, oxyCODONE, polyvinyl alcohol  Physical Exam Vitals and nursing note reviewed.  Constitutional:      General: He is not in acute distress.    Appearance: He is ill-appearing.  Pulmonary:     Effort: No respiratory distress.  Skin:    General: Skin is warm and dry.  Neurological:     Mental Status: He is unresponsive.     Motor: Weakness present.             Vital Signs: BP (!) 77/49 (BP Location: Right Arm)    Pulse 97   Temp (!) 97.4 F (36.3 C) (Oral)   Resp 17   Ht 6' (1.829 m)   Wt 86.5 kg   SpO2 (!) 81%   BMI 25.86 kg/m  SpO2: SpO2: (!) 81 % O2 Device: O2 Device: Room Air O2 Flow Rate: O2 Flow Rate (L/min): 2 L/min  Intake/output summary: No intake or output data in the 24 hours ending 07/25/2023 0858 LBM: Last BM Date : 07/21/23 Baseline Weight: Weight: 73.9 kg Most recent weight: Weight: 86.5 kg       Palliative Assessment/Data: PPS 10%      Patient  Active Problem List   Diagnosis Date Noted   Protein-calorie malnutrition, severe 07/12/2023   Palliative care by specialist 07/10/2023   Goals of care, counseling/discussion 07/10/2023   Elevated liver enzymes 07/08/2023   Coagulopathy (HCC) 07/07/2023   Somnolence 07/07/2023   Septic shock (HCC) 07/07/2023   Shock liver 07/06/2023   Need for acute hemodialysis (HCC) 07/06/2023   Acute biliary pancreatitis 07/06/2023   Dog bite 03/12/2023   Acute pancreatitis 03/11/2023   Metabolic acidosis 09/25/2022   Bladder outlet obstruction 09/25/2022   Acute pancreatitis without infection or necrosis 05/24/2022   Alcohol-induced acute pancreatitis 05/23/2022   Cholelithiasis 05/20/2022   Tobacco use disorder 05/20/2022   GERD (gastroesophageal reflux disease) 05/20/2022   B12 deficiency 03/15/2022   History of colonic polyps 03/09/2022   Gallbladder polyp 03/01/2022   Alcohol abuse 11/28/2021   Upper abdominal pain 08/11/2021   AKI (acute kidney injury) (HCC) 08/11/2021   Portal hypertension (HCC) 08/03/2021   Other ascites 08/03/2021   Moderate protein-calorie malnutrition (HCC) 08/03/2021   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   Elevated ferritin 08/26/2020   Hypomagnesemia 08/11/2020   Hypoalbuminemia 08/11/2020   Hypotension 08/11/2020   Anemia of chronic disease 08/11/2020   DVT (deep venous thrombosis) -Lt LL Extensive DVT 08/05/2020   Alcoholic cirrhosis (HCC) 08/05/2020   COPD (chronic  obstructive pulmonary disease) (HCC) 08/05/2020   Hypokalemia 08/05/2020   Cirrhosis of liver without ascites (HCC) 08/04/2020   Edema of left lower extremity 08/04/2020   Loss of weight 07/07/2020   Loss of appetite 07/07/2020   Anasarca 07/07/2020   Heme + stool 01/22/2017   Abnormal LFTs 01/22/2017   Hyperlipidemia 02/08/2015   Cigarette nicotine dependence with nicotine-induced disorder 02/08/2015   Leukocytosis 01/07/2013   Hyponatremia 01/07/2013   UTI (urinary tract infection) 01/06/2013   Kidney stone 01/06/2013   Ureteral colic 01/06/2013    Palliative Care Assessment & Plan   Patient Profile: 58 y.o. male  with past medical history of hypertension, COPD, alcoholic liver cirrhosis, hemosiderosis, CKD 3B, dyslipidemia, prior left lower extremity DVT presenting to [facility] ED with complaint of abdominal pain. He was admitted on 07/05/2023 with acute pancreatitis with septic shock and lactic acidosis, decompensated EtOH cirrhosis complicated by shock liver with associated coagulopathy and thrombocytopenia, acute encephalopathy, and others.    Patient transferred to [facility] on 07/06/23 for higher level of care.  See significant hospital events below. Intubated 3/9, extubated 3/12. CRRT 3/9 - 3/12 and 3/16 - 3/18   He has remained hypotensive, and has been on and off pressors.  He did not respond to fluid or Lasix challenge.  He has remained encephalopathic and confused.  Per nephrology, he is not a candidate for long-term HD.   Palliative medicine was consulted for GOC conversations. Family meeting with PMT and PCCM on 3/12; discussed  patient's advanced liver disease and overall poor prognosis. Code status changed to DNR on 3/13. Another family meeting held on 3/19; discussed possible transition to comfort care.   Assessment: Principal Problem:   Acute pancreatitis Active Problems:   Alcoholic cirrhosis (HCC)   Shock liver   Need for acute hemodialysis (HCC)   Acute biliary  pancreatitis   Coagulopathy (HCC)   Somnolence   Septic shock (HCC)   Elevated liver enzymes   Palliative care by specialist   Goals of care, counseling/discussion   Protein-calorie malnutrition, severe   Terminal care  Recommendations/Plan: Continue full comfort measures Continue DNR/DNI as previously documented Anticipate hospital death  Comfort focused medication regimen adjusted as noted below PMT will continue to follow and support holistically   Symptom Management Dilaudid PRN severe pain/dyspnea/increased work of breathing/RR>25 Oxycodone PRN moderate pain Tylenol PRN pain/fever Biotin PRN dry mouth Robinul PRN secretions Haldol PRN agitation/delirium Ativan PRN anxiety/seizure/sleep/distress Zofran PRN nausea/vomiting Scheduled Liquifilm Tears q4h; continue PRN doses for dry eye Nicotine patch daily  Goals of Care and Additional Recommendations: Limitations on Scope of Treatment: Full Comfort Care  Code Status:    Code Status Orders  (From admission, onward)           Start     Ordered   07/22/23 1608  Do not attempt resuscitation (DNR) - Comfort care  Continuous       Question Answer Comment  If patient has no pulse and is not breathing Do Not Attempt Resuscitation   In Pre-Arrest Conditions (Patient Is Breathing and Has a Pulse) Provide comfort measures. Relieve any mechanical airway obstruction. Avoid transfer unless required for comfort.   Consent: Discussion documented in EHR or advanced directives reviewed      07/22/23 1610           Code Status History     Date Active Date Inactive Code Status Order ID Comments User Context   07/12/2023 1618 07/22/2023 1610 Do not attempt resuscitation (DNR) PRE-ARREST INTERVENTIONS DESIRED [id number]  Merry Proud, NP Inpatient   07/06/2023 2158 07/12/2023 1618 Full Code [id number]  Marylu Lund Inpatient   07/05/2023 1312 07/06/2023 2158 Full Code [id number]  Erick Blinks, DO ED   03/12/2023 0241  03/14/2023 1553 Full Code [id number]  Zierle-Ghosh, Asia B, DO Inpatient   09/23/2022 0352 09/25/2022 1624 Full Code [id number]  Lilyan Gilford, DO ED   05/19/2022 2232 05/24/2022 1733 Full Code [id number]  Zierle-Ghosh, Asia B, DO ED   03/02/2022 1558 03/04/2022 1717 Full Code [id number]  Erick Blinks, DO Inpatient   02/09/2021 2224 02/10/2021 2220 Full Code [id number]  Elgergawy, Leana Roe, MD Inpatient   08/05/2020 1619 08/11/2020 2231 Full Code [id number]  Shon Hale, MD Inpatient   01/06/2013 2358 01/09/2013 1651 Full Code [id number]  Vania Rea, MD Inpatient       Prognosis:  Hours - Days  Discharge Planning: Anticipated Hospital Death  Care plan was discussed with primary RN  Thank you for allowing the Palliative Medicine Team to assist in the care of this patient.     Haskel Khan, NP  Please contact Palliative Medicine Team phone at [phone] for questions and concerns.   *Portions of this note are a verbal dictation therefore any spelling and/or grammatical errors are due to the "[employer]" system interpretation.

## 2023-07-31 NOTE — TOC Progression Note (Signed)
 Transition of Care (TOC) - Progression Note   Disused with attending and nurse. Patient not stable for transfer . Anticipate hospital death Patient Details  Name: PEACE JOST MRN: [id number] Date of Birth: 12-18-1965  Transition of Care [facility]) CM/SW Contact  Kemaria Dedic, Adria Devon, RN Phone Number: 07/23/2023, 10:18 AM  Clinical Narrative:         Barriers to Discharge: Continued Medical Work up  Expected Discharge Plan and Services       Living arrangements for the past 2 months: Single Family Home                                       Social Determinants of Health (SDOH) Interventions SDOH Screenings   Food Insecurity: No Food Insecurity (07/06/2023)  Housing: Low Risk  (07/06/2023)  Transportation Needs: No Transportation Needs (07/06/2023)  Utilities: Not At Risk (07/06/2023)  Alcohol Screen: Low Risk  (09/13/2020)  Financial Resource Strain: High Risk (11/28/2021)   Received from [facility]  Physical Activity: Sufficiently Active (09/13/2020)  Social Connections: Moderately Isolated (09/13/2020)  Stress: No Stress Concern Present (09/13/2020)  Tobacco Use: High Risk (07/05/2023)    Readmission Risk Interventions    03/13/2023    2:50 PM 05/22/2022    9:08 AM  Readmission Risk Prevention Plan  Transportation Screening Complete Complete  HRI or Home Care Consult Complete Complete  Social Work Consult for Recovery Care Planning/Counseling Complete Complete  Palliative Care Screening Not Applicable Not Applicable  Medication Review Oceanographer) Complete Complete

## 2023-07-31 NOTE — Death Summary Note (Signed)
 DEATH SUMMARY   Patient Details  Name: [name] [name] MRN: [id number] DOB: May 04, 1965  Admission/Discharge Information   Admit Date:  07-21-23  Date of Death: Date of Death: August 09, 2023  Time of Death: Time of Death: 13-Aug-1637  Length of Stay: 03-Aug-2023  Referring Physician: Benetta Spar, MD   Reason(s) for Hospitalization  Acute pancreatitis  Diagnoses  Preliminary cause of death: renal failure Secondary Diagnoses (including complications and co-morbidities):  Principal Problem:   Acute pancreatitis Active Problems:   Alcoholic cirrhosis (HCC)   Shock liver   Need for acute hemodialysis (HCC)   Acute biliary pancreatitis   Coagulopathy (HCC)   Somnolence   Septic shock (HCC)   Elevated liver enzymes   Palliative care by specialist   Goals of care, counseling/discussion   Protein-calorie malnutrition, severe   Brief Hospital Course (including significant findings, care, treatment, and services provided and events leading to death)  58 year old male patient who initially came in with mixed picture of SIRS versus sepsis with persistent vasoplegia in the setting of acute pancreatitis, further complicated by acute liver injury, worsening encephalopathy, and acute on chronic renal failure.  Shock was initially improved, was intubated for short period of time and then successfully extubated, however in spite of appropriate antibiotics, supportive care, and time shock and renal failure persisted.  He was also supported on continuous renal replacement therapy.  Given his advanced cirrhosis he is not a candidate for long-term dialysis.  Dialysis was discontinued, hemodynamically he has remained vasoplegic, he has had extensive family meetings with his sister, critical care, as well as palliative.  DNR was agreed and elected to pursue comfort measures.    Pertinent Labs and Studies  Significant Diagnostic Studies CT CHEST ABDOMEN PELVIS WO CONTRAST Result Date: 07/14/2023 CLINICAL DATA:   Sepsis EXAM: CT CHEST, ABDOMEN AND PELVIS WITHOUT CONTRAST TECHNIQUE: Multidetector CT imaging of the chest, abdomen and pelvis was performed following the standard protocol without IV contrast. RADIATION DOSE REDUCTION: This exam was performed according to the departmental dose-optimization program which includes automated exposure control, adjustment of the mA and/or kV according to patient size and/or use of iterative reconstruction technique. COMPARISON:  07/07/2023 FINDINGS: CT CHEST FINDINGS Cardiovascular: Heart is normal size. Aorta is normal caliber. Moderate coronary artery and aortic atherosclerosis. Mediastinum/Nodes: Mediastinal adenopathy. Right paratracheal lymph node has a short axis diameter of 3 cm. Subcarinal adenopathy has a short axis diameter of 3.2 cm. Concern for right hilar adenopathy although difficult to evaluate without intravenous contrast. No axillary adenopathy. Trachea and esophagus are unremarkable. Thyroid unremarkable. Lungs/Pleura: Small bilateral pleural effusions. Dependent atelectasis in the lower lobes. Mild vascular congestion. Patchy ground-glass opacities anteriorly in the right upper lobe and lingula could reflect early pneumonia. Musculoskeletal: Bilateral gynecomastia.  No acute bony abnormality. CT ABDOMEN PELVIS FINDINGS Hepatobiliary: High-density material noted within the gallbladder could reflect small stones or sludge. No focal hepatic abnormality. Pancreas: Pancreas appears enlarged. Surrounding edema compatible with acute pancreatitis. Spleen: No focal abnormality.  Normal size. Adrenals/Urinary Tract: Adrenal glands normal. Atrophic left kidney. No stones or hydronephrosis bilaterally. Urinary bladder decompressed with Foley catheter in place. Stomach/Bowel: Scattered colonic diverticulosis. No active diverticulitis. NG tube is in place with the tip in the duodenal bulb. Small bowel decompressed. Vascular/Lymphatic: Aortic atherosclerosis. No evidence of  aneurysm or adenopathy. Reproductive: No visible focal abnormality. Other: Moderate ascites in the abdomen and pelvis. No free air. No retroperitoneal hemorrhage. Musculoskeletal: No acute bony abnormality. Anasarca throughout the subcutaneous soft tissues. IMPRESSION: Mediastinal adenopathy. This  is concerning for possible metastasis or lymphoma. Coronary artery disease, aortic atherosclerosis. Small bilateral pleural effusions with dependent atelectasis in the lower lobes. Patchy ground-glass opacities in the upper lobes could reflect pneumonia. Enlarged edematous pancreas with surrounding inflammation compatible with acute pancreatitis. Moderate ascites. Electronically Signed   By: Charlett Nose M.D.   On: 07/14/2023 21:33   DG Abd Portable 1V Result Date: 07/13/2023 CLINICAL DATA:  Feeding tube placement EXAM: PORTABLE ABDOMEN - 1 VIEW COMPARISON:  07/11/2023 FINDINGS: Soft feeding tube enters the stomach, passes to the fundus and has its tip in the mid body. Gas present throughout much of the small bowel which could be due to ileus or partial small bowel obstruction. IMPRESSION: Soft feeding tube tip in the mid body of the stomach. Increase in small bowel air which could be due to ileus or partial small bowel obstruction. Electronically Signed   By: Paulina Fusi M.D.   On: 07/13/2023 16:38   DG Abd Portable 1V Result Date: 07/11/2023 CLINICAL DATA:  Check feeding catheter placement EXAM: PORTABLE ABDOMEN - 1 VIEW COMPARISON:  07/10/2023 FINDINGS: Weighted feeding catheter is again noted in the distal stomach stable from the prior exam. No obstructive pattern is seen. No free air is noted. IMPRESSION: Feeding catheter in the distal stomach. Electronically Signed   By: Alcide Clever M.D.   On: 07/11/2023 11:19   DG Chest Port 1 View Result Date: 07/11/2023 CLINICAL DATA:  Respiratory failure EXAM: PORTABLE CHEST 1 VIEW COMPARISON:  07/09/2023 FINDINGS: Cardiac shadow is stable. Endotracheal tube, feeding  catheter and right jugular central line are again seen and stable. Persistent right-sided pleural effusion is noted. Rounded fullness is again noted in the right perihilar region. No new focal abnormality is noted. IMPRESSION: Stable right-sided pleural effusion. Rounded soft tissue prominence in the right hilum. CT of the chest is again recommended when the patient's condition improves for further evaluation. Electronically Signed   By: Alcide Clever M.D.   On: 07/11/2023 10:32   CT HEAD WO CONTRAST ( ) Result Date: 07/10/2023 CLINICAL DATA:  58 year old male with neurologic deficit. EXAM: CT HEAD WITHOUT CONTRAST TECHNIQUE: Contiguous axial images were obtained from the base of the skull through the vertex without intravenous contrast. RADIATION DOSE REDUCTION: This exam was performed according to the departmental dose-optimization program which includes automated exposure control, adjustment of the mA and/or kV according to patient size and/or use of iterative reconstruction technique. COMPARISON:  Head CT 07/07/2023. FINDINGS: Brain: No midline shift, ventriculomegaly, mass effect, evidence of mass lesion, intracranial hemorrhage or evidence of cortically based acute infarction. Stable gray-white matter differentiation throughout the brain. Bilateral cerebral hemisphere chronic small vessel disease appears stable. Vascular: No suspicious intracranial vascular hyperdensity. Calcified atherosclerosis at the skull base. Skull: Congenital incomplete ossification of the posterior C1 ring. No acute osseous abnormality identified. Sinuses/Orbits: Left nasoenteric tube now in place. Visualized paranasal sinuses and mastoids are stable and well aerated. Other: No acute orbit or scalp soft tissue finding. IMPRESSION: 1. Continued stable noncontrast CT appearance of small vessel disease. No acute intracranial abnormality. 2. Left nasoenteric tube now in place. Electronically Signed   By: Odessa Fleming M.D.   On: 07/10/2023  16:23   DG Abd Portable 1V Result Date: 07/10/2023 CLINICAL DATA:  Tube placement EXAM: PORTABLE ABDOMEN - 1 VIEW COMPARISON:  X-ray 07/09/2023 FINDINGS: Limited x-ray for tube placement has feeding tube extending just to the right of the upper lumbar spine, likely along the distal stomach. There is gas in  nondilated loops of bowel elsewhere in the visualized portions of the abdomen. The right hemi abdominal edge is clipped off the edge of the film. Presumed left femoral catheter in place at the edge of the imaging field. IMPRESSION: Limited x-ray for tube placement has feeding tube with tip overlying the distal stomach. Similar location to previous Electronically Signed   By: Karen Kays M.D.   On: 07/10/2023 13:17   DG Abd Portable 1V Result Date: 07/09/2023 CLINICAL DATA:  Nasogastric tube placement. EXAM: PORTABLE ABDOMEN - 1 VIEW COMPARISON:  Radiograph yesterday. FINDINGS: Tip of the weighted enteric tube in the region of the distal stomach. The previous enteric tube has been removed. No bowel dilatation in the limited included upper abdomen. IMPRESSION: Tip of the weighted enteric tube in the region of the distal stomach. Electronically Signed   By: Narda Rutherford M.D.   On: 07/09/2023 23:13   DG Chest Port 1 View Result Date: 07/09/2023 CLINICAL DATA:  Central line placement EXAM: PORTABLE CHEST 1 VIEW COMPARISON:  07/09/2023, 07/08/2023, 07/07/2023, 03/11/2023 FINDINGS: Endotracheal tube tip is about 2.9 cm superior to carina. Enteric tube tip below the diaphragm but incompletely assessed. Right IJ central venous catheter tip near the cavoatrial region. No visible right pneumothorax. Low lung volumes with mild airspace disease at right base and small right effusion. Stable cardiomediastinal silhouette with vascular congestion and right hilar asymmetrical enlargement. IMPRESSION: 1. Right IJ central venous catheter tip near the cavoatrial region. No visible pneumothorax. 2. Low lung volumes with  mild airspace disease at the right base and small right effusion. 3. Similar right hilar asymmetrical opacity for which contrast-enhanced chest CT follow has been previously recommended Electronically Signed   By: Jasmine Pang M.D.   On: 07/09/2023 15:48   ECHOCARDIOGRAM COMPLETE Result Date: 07/09/2023    ECHOCARDIOGRAM REPORT   Patient Name:   [name] [name] Date of Exam: 07/09/2023 Medical Rec #:  [id number]    Height:       72.0 in Accession #:    [id number]   Weight:       177.0 lb Date of Birth:  October 21, 1965     BSA:          2.023 m Patient Age:    58 years     BP:           127/52 mmHg Patient Gender: M            HR:           111 bpm. Exam Location:  Inpatient Procedure: 2D Echo, Cardiac Doppler and Color Doppler (Both Spectral and Color            Flow Doppler were utilized during procedure). Indications:    Shock  History:        Patient has prior history of Echocardiogram examinations, most                 recent 08/06/2020. COPD, Signs/Symptoms:Hypotension; Risk                 Factors:Hypertension, Dyslipidemia and Current Smoker.  Sonographer:    Lucendia Herrlich RCS Referring Phys: [phone] PAULA B SIMPSON IMPRESSIONS  1. Left ventricular ejection fraction, by estimation, is 55%. The left ventricle has normal function. The left ventricle has no regional wall motion abnormalities. Left ventricular diastolic parameters are consistent with Grade I diastolic dysfunction (impaired relaxation).  2. Right ventricular systolic function is normal. The right ventricular size is normal. The estimated right  ventricular systolic pressure is 25.6 mmHg.  3. The mitral valve is normal in structure. No evidence of mitral valve regurgitation. No evidence of mitral stenosis.  4. The aortic valve is tricuspid. There is mild calcification of the aortic valve. Aortic valve regurgitation is not visualized. No aortic stenosis is present.  5. The inferior vena cava is normal in size with <50% respiratory variability, suggesting  right atrial pressure of 8 mmHg. FINDINGS  Left Ventricle: Left ventricular ejection fraction, by estimation, is 55%. The left ventricle has normal function. The left ventricle has no regional wall motion abnormalities. The left ventricular internal cavity size was normal in size. There is no left ventricular hypertrophy. Left ventricular diastolic parameters are consistent with Grade I diastolic dysfunction (impaired relaxation). Right Ventricle: The right ventricular size is normal. No increase in right ventricular wall thickness. Right ventricular systolic function is normal. The tricuspid regurgitant velocity is 2.10 m/s, and with an assumed right atrial pressure of 8 mmHg, the estimated right ventricular systolic pressure is 25.6 mmHg. Left Atrium: Left atrial size was normal in size. Right Atrium: Right atrial size was normal in size. Pericardium: Trivial pericardial effusion is present. Mitral Valve: The mitral valve is normal in structure. No evidence of mitral valve regurgitation. No evidence of mitral valve stenosis. Tricuspid Valve: The tricuspid valve is normal in structure. Tricuspid valve regurgitation is trivial. Aortic Valve: The aortic valve is tricuspid. There is mild calcification of the aortic valve. Aortic valve regurgitation is not visualized. No aortic stenosis is present. Aortic valve peak gradient measures 6.2 mmHg. Pulmonic Valve: The pulmonic valve was normal in structure. Pulmonic valve regurgitation is trivial. Aorta: The aortic root is normal in size and structure. Venous: The inferior vena cava is normal in size with less than 50% respiratory variability, suggesting right atrial pressure of 8 mmHg. IAS/Shunts: No atrial level shunt detected by color flow Doppler.  LEFT VENTRICLE PLAX 2D LVIDd:         4.60 cm   Diastology LVIDs:         3.20 cm   LV e' medial:    7.72 cm/s LV PW:         1.10 cm   LV E/e' medial:  6.2 LV IVS:        1.10 cm   LV e' lateral:   11.70 cm/s LVOT diam:      1.90 cm   LV E/e' lateral: 4.1 LV SV:         38 LV SV Index:   19 LVOT Area:     2.84 cm  RIGHT VENTRICLE             IVC RV S prime:     14.00 cm/s  IVC diam: 1.90 cm TAPSE (M-mode): 1.6 cm LEFT ATRIUM           Index        RIGHT ATRIUM           Index LA diam:      2.60 cm 1.29 cm/m   RA Area:     12.70 cm LA Vol (A2C): 29.3 ml 14.48 ml/m  RA Volume:   25.80 ml  12.75 ml/m LA Vol (A4C): 25.5 ml 12.61 ml/m  AORTIC VALVE AV Area (Vmax): 2.25 cm AV Vmax:        125.00 cm/s AV Peak Grad:   6.2 mmHg LVOT Vmax:      99.20 cm/s LVOT Vmean:     65.400 cm/s LVOT  VTI:       0.134 m  AORTA Ao Root diam: 3.60 cm Ao Asc diam:  3.60 cm MITRAL VALVE               TRICUSPID VALVE MV Area (PHT): 4.10 cm    TR Peak grad:   17.6 mmHg MV Decel Time: 185 msec    TR Vmax:        210.00 cm/s MV E velocity: 48.20 cm/s MV A velocity: 76.50 cm/s  SHUNTS MV E/A ratio:  0.63        Systemic VTI:  0.13 m                            Systemic Diam: 1.90 cm Dalton McleanMD Electronically signed by Wilfred Lacy Signature Date/Time: 07/09/2023/2:40:34 PM    Final    DG Chest Port 1 View Result Date: 07/09/2023 CLINICAL DATA:  Encounter for respiratory failure. EXAM: PORTABLE CHEST 1 VIEW COMPARISON:  07/08/2023 FINDINGS: ETT tip is stable above the carina. Enteric tube tip courses below the GE junction. Heart size and mediastinal contours are stable. Right pleural effusion is unchanged. Right-sided atelectasis is also noted. Visualized osseous structures are unremarkable. IMPRESSION: 1. Stable support apparatus. 2. Unchanged right pleural effusion and right-sided atelectasis. Electronically Signed   By: Signa Kell M.D.   On: 07/09/2023 08:56   DG Abd Portable 1V Result Date: 07/08/2023 CLINICAL DATA:  Orogastric tube EXAM: PORTABLE ABDOMEN - 1 VIEW COMPARISON:  Abdominal x-ray 06/2023 FINDINGS: Orogastric tube tip is in the mid body of the stomach. IMPRESSION: Orogastric tube tip is in the mid body of the stomach.  Electronically Signed   By: Darliss Cheney M.D.   On: 07/08/2023 18:09   DG CHEST PORT 1 VIEW Result Date: 07/08/2023 CLINICAL DATA:  Orogastric tube EXAM: PORTABLE CHEST 1 VIEW COMPARISON:  Chest x-ray 06/2023. FINDINGS: Endotracheal tube tip is 2.5 cm above the carina. Enteric tube tip is not seen, but the tube extends below the diaphragm. There is a new small right pleural effusion. There is soft tissue prominence of the right hilum, unchanged. Cardiac silhouette is enlarged, unchanged. No pneumothorax or acute fracture. IMPRESSION: 1. Endotracheal tube tip is 2.5 cm above the carina. 2. Enteric tube tip is not seen, but the tube extends below the diaphragm. 3. New small right pleural effusion. 4. Stable soft tissue prominence of the right hilum. Follow-up chest CT recommended. Electronically Signed   By: Darliss Cheney M.D.   On: 07/08/2023 18:09   CT ABDOMEN PELVIS WO CONTRAST Result Date: 07/07/2023 CLINICAL DATA:  58 year old male with abdominal pain and nausea. Recent noncontrast CT suspicious for pancreatitis. EXAM: CT ABDOMEN AND PELVIS WITHOUT CONTRAST TECHNIQUE: Multidetector CT imaging of the abdomen and pelvis was performed following the standard protocol without IV contrast. RADIATION DOSE REDUCTION: This exam was performed according to the departmental dose-optimization program which includes automated exposure control, adjustment of the mA and/or kV according to patient size and/or use of iterative reconstruction technique. COMPARISON:  Noncontrast CT Abdomen and Pelvis 07/05/2023. FINDINGS: Lower chest: There is a partially visible posterior mediastinal mass on series 3, image 1 which is a level of the lower chest higher than on the recent abdomen CT. And no prior chest CT for comparison. Partially visible mass here is at least 6 cm long axis. Chest CT with IV contrast recommended to further characterize. No pericardial effusion. Small layering pleural effusions are new, greater on  the right.  Associated mild lung base atelectasis. Hepatobiliary: Hyperdensity again noted within the gallbladder which is partially contracted now. Small volume new perihepatic free fluid which seems to have simple fluid density. No discrete liver lesion on this noncontrast exam. Pancreas: New extensive upper abdominal inflammatory stranding seems to emanate from the lesser sac and root of the small bowel mesentery and the noncontrast pancreas remains indistinct. No organized fluid identified. Most of the free fluid seems to be simple density. Spleen: Adjacent new inflammatory stranding and small volume free fluid, otherwise negative noncontrast appearance. Adrenals/Urinary Tract: Adrenal glands remain normal. Asymmetric left renal atrophy is unchanged. Mild left nephrocalcinosis again noted. Right kidney appears stable and nonobstructed. New bilateral pararenal space inflammation and fluid which appears to be secondary. Nondilated ureters. Decompressed bladder. Pelvic phleboliths. Stomach/Bowel: Scattered new free fluid in the abdomen and pelvis most of which has simple fluid density. No pneumoperitoneum identified. Small volume oral contrast mixed with air in fluid in the stomach. Distal stomach and duodenum appear thick-walled, inflamed. Nondilated small bowel throughout the abdomen. Large bowel diverticulosis and some large bowel wall thickening. Small volume of oral contrast in the ascending colon and transverse. Descending and rectosigmoid colon appear mildly thickened. Vascular/Lymphatic: Extensive Aortoiliac calcified atherosclerosis. Vascular patency is not evaluated in the absence of IV contrast. No discrete lymphadenopathy. There is a new left femoral venous dual lumen catheter which terminates in a lumbar venous branch. No evidence of surrounding hemorrhage. No retroperitoneal hemorrhage identified elsewhere. Reproductive: Negative. Other: Small volume new free fluid in the pelvis with simple fluid density.  Musculoskeletal: Intermittent motion artifact. No acute or suspicious osseous lesion is identified IMPRESSION: 1. Partially visible Posterior Mediastinal Mass, approximately 6 cm. And the right hilum appeared abnormal on recent portable chest x-ray. Recommend Chest CT with IV contrast to further characterize. 2. A left femoral approach dual lumen venous catheter appears to terminates in a small lumbar vein rather than continue into the left Common iliac vein. If this line can be retracted approximately 3 cm then it might be maintained in the left external iliac vein. Otherwise it should be removed, replaced. 3. No retroperitoneal hemorrhage identified. But extensive new inflammatory stranding and scattered free fluid in the abdomen and pelvis which seems to emanate from the lesser sac. Suspect progressive Acute Pancreatitis. No organized fluid collection or other complicating features evident on this noncontrast exam. 4. Widespread but probably secondary inflammation of the bowel with small and large bowel wall thickening. No evidence of bowel obstruction. No pneumoperitoneum. 5.  Aortic Atherosclerosis (ICD10-I70.0). Electronically Signed   By: Odessa Fleming M.D.   On: 07/07/2023 05:47   CT HEAD WO CONTRAST ( ) Result Date: 07/07/2023 CLINICAL DATA:  58 year old male with altered mental status. EXAM: CT HEAD WITHOUT CONTRAST TECHNIQUE: Contiguous axial images were obtained from the base of the skull through the vertex without intravenous contrast. RADIATION DOSE REDUCTION: This exam was performed according to the departmental dose-optimization program which includes automated exposure control, adjustment of the mA and/or kV according to patient size and/or use of iterative reconstruction technique. COMPARISON:  Head CT 02/24/2023. FINDINGS: Brain: No midline shift, ventriculomegaly, mass effect, evidence of mass lesion, intracranial hemorrhage or evidence of cortically based acute infarction. Advanced chronic small  vessel disease with chronic lacunar infarcts in both caudate nuclei, Patchy and confluent bilateral cerebral white matter hypodensity with deep white matter capsule involvement. Stable gray-white matter differentiation throughout the brain. Vascular: No suspicious intracranial vascular hyperdensity. Calcified atherosclerosis at the skull base.  Skull: Congenital incomplete ossification of the posterior C1 ring, normal variant. Calvarium appears stable and intact. Sinuses/Orbits: Mild new paranasal sinus mucosal thickening, underlying hyperplastic paranasal sinuses which overall remain well aerated. Tympanic cavities and mastoids appear clear. Other: No acute orbit or scalp soft tissue finding. IMPRESSION: 1. No acute intracranial abnormality. 2. Stable non contrast CT appearance of advanced chronic small vessel disease. 3. Mild new paranasal sinus inflammation. Electronically Signed   By: Odessa Fleming M.D.   On: 07/07/2023 05:36   DG CHEST PORT 1 VIEW Result Date: 07/07/2023 CLINICAL DATA:  Tachypnea. EXAM: PORTABLE CHEST 1 VIEW COMPARISON:  03/11/2023 FINDINGS: Low lung volumes. Interstitial markings are diffusely coarsened with chronic features. Basilar atelectasis or infiltrate, right greater than left. Soft tissue fullness in the right hilum raises concern for lymphadenopathy or central mass lesion. Cardiopericardial silhouette is at upper limits of normal for size. No acute bony abnormality. Telemetry leads overlie the chest. IMPRESSION: Soft tissue fullness in the right hilar region concerning for lymphadenopathy or central pulmonary mass. Chest CT with contrast recommended to further evaluate. Bibasilar atelectasis or infiltrate. These results will be called to the ordering clinician or representative by the Radiologist Assistant, and communication documented in the PACS or [employer]. Electronically Signed   By: Kennith Center M.D.   On: 07/07/2023 05:24   DG Abd 1 View Result Date: 07/07/2023 CLINICAL  DATA:  Abdominal pain. EXAM: ABDOMEN - 1 VIEW COMPARISON:  01/13/2013 FINDINGS: No gaseous bowel dilatation to suggest obstruction. Gas is visible diffusely in the nondilated colon. Left femoral vascular dialysis catheter evident. Phleboliths are seen over the pelvis. IMPRESSION: No evidence for bowel obstruction. Electronically Signed   By: Kennith Center M.D.   On: 07/07/2023 05:22   [place] ASCITES (ABDOMEN LIMITED) Result Date: 07/06/2023 CLINICAL DATA:  Elevated LFTs. Abdominal distension. Paracentesis was requested. EXAM: LIMITED ABDOMEN ULTRASOUND FOR ASCITES TECHNIQUE: Limited ultrasound survey for ascites was performed in all four abdominal quadrants. COMPARISON:  Hepatic ultrasound 07/05/2023. CT of the abdomen pelvis 07/05/2023. FINDINGS: No significant abdominal ascites are present. The patient is not a candidate for paracentesis. IMPRESSION: No significant abdominal ascites. The patient is not a candidate for paracentesis. Electronically Signed   By: Marin Roberts M.D.   On: 07/06/2023 11:57   US ABDOMEN LIMITED WITH LIVER DOPPLER Result Date: 07/05/2023 CLINICAL DATA:  Abdominal pain.  Known cirrhosis. EXAM: DUPLEX ULTRASOUND OF LIVER TECHNIQUE: Color and duplex Doppler ultrasound was performed to evaluate the hepatic in-flow and out-flow vessels. COMPARISON:  CT 07/05/2023 earlier.  Noncontrast.  MRI 04/12/2023 FINDINGS: Liver: Nodular appearance of the liver. Please correlate with known history. Gallbladder is distended with some wall thickening and edema, nonspecific in the presence of cirrhosis. Common duct measures 5 mm. Main Portal Vein size: 9 mm cm Portal Vein Velocities Main Prox:  35.7 cm/sec Main Mid: 29 cm/sec Main Dist:  31.2 cm/sec Right: 31.9 cm/sec, posterior branch of the right portal vein Left: 19.9 cm/sec Hepatic Vein Velocities Right:  54 cm/sec Middle:  27.5 cm/sec Left:  30 cm/sec IVC: Present and patent with normal respiratory phasicity. Hepatic Artery Velocity:  220.5  cm/sec Portal Vein Occlusion/Thrombus: No Ascites: Trace Varices: None IMPRESSION: Preserved hepatic vasculature. Appropriate direction flow. Relative increased flow along the a [place] artery. Electronically Signed   By: Karen Kays M.D.   On: 07/05/2023 11:54   CT ABDOMEN PELVIS WO CONTRAST Result Date: 07/05/2023 CLINICAL DATA:  Lower abdominal pain.  Nausea vomiting. EXAM: CT ABDOMEN AND PELVIS WITHOUT  CONTRAST TECHNIQUE: Multidetector CT imaging of the abdomen and pelvis was performed following the standard protocol without IV contrast. RADIATION DOSE REDUCTION: This exam was performed according to the departmental dose-optimization program which includes automated exposure control, adjustment of the mA and/or kV according to patient size and/or use of iterative reconstruction technique. COMPARISON:  03/11/2023 FINDINGS: Lower chest: No acute findings. Hepatobiliary: No suspicious focal abnormality in the liver on this study without intravenous contrast. The liver shows diffusely decreased attenuation suggesting fat deposition. Nodular liver contour is compatible with cirrhosis. Layering tiny calcified gallstones evident. No intrahepatic or extrahepatic biliary dilation. Pancreas: Pancreas is diffusely ill-defined in the region of the head and the tail with subtle peripancreatic edema/inflammation. No main duct dilatation. Spleen: No splenomegaly. No suspicious focal mass lesion. Adrenals/Urinary Tract: No adrenal nodule or mass. Right kidney unremarkable. Left kidney atrophic. No evidence for hydroureter. The urinary bladder appears normal for the degree of distention. Stomach/Bowel: Stomach is nondistended, accentuating wall thickness. Duodenum is normally positioned as is the ligament of Treitz. No small bowel wall thickening. No small bowel dilatation. The terminal ileum is normal. The appendix is normal. No gross colonic mass. No colonic wall thickening. Diverticular changes are noted in the left colon  without evidence of diverticulitis. Vascular/Lymphatic: There is moderate atherosclerotic calcification of the abdominal aorta without aneurysm. There is no gastrohepatic or hepatoduodenal ligament lymphadenopathy. No retroperitoneal or mesenteric lymphadenopathy. No pelvic sidewall lymphadenopathy. Reproductive: The prostate gland and seminal vesicles are unremarkable. Other: No intraperitoneal free fluid. Musculoskeletal: No worrisome lytic or sclerotic osseous abnormality. IMPRESSION: 1. Pancreas is diffusely ill-defined through the tail region with subtle peripancreatic edema/inflammation. Imaging features compatible with acute pancreatitis. Pancreatic protocol CT recommended if renal function permits. If not, MRI of the abdomen with and without contrast recommended as pancreatic mass lesion cannot be excluded. 2. Hepatic cirrhosis with hepatic steatosis. 3. Cholelithiasis. 4. Left colonic diverticulosis without diverticulitis. 5. Atrophic left kidney. Electronically Signed   By: Kennith Center M.D.   On: 07/05/2023 07:48    Microbiology No results found for this or any previous visit (from the past 240 hours).  Lab Basic Metabolic Panel: Recent Labs  Lab 07/18/23 0415 07/18/23 1600 07/19/23 0426 07/20/23 0436 07/21/23 0230 07/22/23 0500  NA 134*  135 136 137  --  135 134*  K 4.2  4.2 4.1 3.9  --  4.9 5.6*  CL 104  102 104 105  --  103 101  CO2 23  21* 18* 19*  --  16* 15*  GLUCOSE 283*  295* 294* 256*  --  206* 139*  BUN 44*  42* 60* 74*  --  122* 139*  CREATININE 2.84*  2.77* 3.54* 4.34*  --  6.36* 7.54*  CALCIUM 7.4*  7.6* 7.5* 7.8*  --  8.5* 8.7*  MG 2.3  --  2.3  --  2.4  --   PHOS 3.2  3.3 3.2 3.3  3.0 3.6 4.6 6.3*   Liver Function Tests: Recent Labs  Lab 07/18/23 0415 07/18/23 1600 07/19/23 0426  ALBUMIN 2.0* 1.8* 1.8*   No results for input(s): "LIPASE", "AMYLASE" in the last 168 hours. No results for input(s): "AMMONIA" in the last 168 hours. CBC: Recent  Labs  Lab 07/18/23 0415 07/21/23 0230  WBC 22.5* 21.6*  HGB 8.6* 8.6*  HCT 25.5* 25.0*  MCV 89.2 87.1  PLT 201 268   Cardiac Enzymes: No results for input(s): "CKTOTAL", "CKMB", "CKMBINDEX", "TROPONINI" in the last 168 hours. Sepsis Labs: Recent Labs  Lab 07/18/23 0415 07/21/23 0230  WBC 22.5* 21.6*    Procedures/Operations  As per EMR   Lesia Sago Mical Brun 07/19/2023, 6:55 PM

## 2023-07-31 DEATH — deceased

## 2023-08-16 ENCOUNTER — Ambulatory Visit: Payer: Medicaid Other | Admitting: Gastroenterology

## 2023-08-24 ENCOUNTER — Inpatient Hospital Stay: Payer: Medicaid Other

## 2023-09-21 ENCOUNTER — Inpatient Hospital Stay: Payer: Medicaid Other

## 2023-10-15 ENCOUNTER — Inpatient Hospital Stay: Payer: Medicaid Other

## 2023-10-22 ENCOUNTER — Inpatient Hospital Stay: Payer: Medicaid Other

## 2023-10-22 ENCOUNTER — Inpatient Hospital Stay: Payer: Medicaid Other | Admitting: Physician Assistant
